# Patient Record
Sex: Female | Born: 1940
Health system: Southern US, Community
[De-identification: ages and names within clinical notes are randomized; demographics above are authoritative.]

## PROBLEM LIST (undated history)

## (undated) DIAGNOSIS — M51369 Other intervertebral disc degeneration, lumbar region without mention of lumbar back pain or lower extremity pain: Secondary | ICD-10-CM

## (undated) DIAGNOSIS — I639 Cerebral infarction, unspecified: Secondary | ICD-10-CM

## (undated) DIAGNOSIS — I10 Essential (primary) hypertension: Secondary | ICD-10-CM

## (undated) DIAGNOSIS — E1165 Type 2 diabetes mellitus with hyperglycemia: Secondary | ICD-10-CM

## (undated) DIAGNOSIS — E119 Type 2 diabetes mellitus without complications: Secondary | ICD-10-CM

## (undated) DIAGNOSIS — M1990 Unspecified osteoarthritis, unspecified site: Secondary | ICD-10-CM

## (undated) DIAGNOSIS — M5136 Other intervertebral disc degeneration, lumbar region: Secondary | ICD-10-CM

## (undated) DIAGNOSIS — G8929 Other chronic pain: Secondary | ICD-10-CM

## (undated) DIAGNOSIS — T7840XA Allergy, unspecified, initial encounter: Secondary | ICD-10-CM

## (undated) DIAGNOSIS — Z87891 Personal history of nicotine dependence: Secondary | ICD-10-CM

## (undated) DIAGNOSIS — E039 Hypothyroidism, unspecified: Secondary | ICD-10-CM

## (undated) DIAGNOSIS — E785 Hyperlipidemia, unspecified: Secondary | ICD-10-CM

## (undated) HISTORY — DX: Other intervertebral disc degeneration, lumbar region without mention of lumbar back pain or lower extremity pain: M51.369

## (undated) HISTORY — DX: Allergy, unspecified, initial encounter: T78.40XA

## (undated) HISTORY — DX: Other intervertebral disc degeneration, lumbar region: M51.36

## (undated) HISTORY — DX: Other chronic pain: G89.29

## (undated) HISTORY — DX: Type 2 diabetes mellitus with hyperglycemia: E11.65

## (undated) HISTORY — DX: Essential (primary) hypertension: I10

## (undated) HISTORY — DX: Type 2 diabetes mellitus without complications: E11.9

---

## 1971-04-28 HISTORY — PX: CHOLECYSTECTOMY: SHX55

## 1992-04-27 HISTORY — PX: ABDOMINAL HYSTERECTOMY: SHX81

## 2000-01-13 ENCOUNTER — Emergency Department (HOSPITAL_COMMUNITY): Admission: EM | Admit: 2000-01-13 | Discharge: 2000-01-13 | Payer: Self-pay | Admitting: Emergency Medicine

## 2000-01-13 ENCOUNTER — Encounter: Payer: Self-pay | Admitting: Emergency Medicine

## 2000-06-22 ENCOUNTER — Encounter: Payer: Self-pay | Admitting: Emergency Medicine

## 2000-06-22 ENCOUNTER — Emergency Department (HOSPITAL_COMMUNITY): Admission: EM | Admit: 2000-06-22 | Discharge: 2000-06-22 | Payer: Self-pay | Admitting: Emergency Medicine

## 2001-04-27 DIAGNOSIS — I6350 Cerebral infarction due to unspecified occlusion or stenosis of unspecified cerebral artery: Secondary | ICD-10-CM | POA: Insufficient documentation

## 2003-04-28 HISTORY — PX: DECOMPRESSIVE LUMBAR LAMINECTOMY LEVEL 4: SHX5794

## 2006-06-14 ENCOUNTER — Encounter (INDEPENDENT_AMBULATORY_CARE_PROVIDER_SITE_OTHER): Payer: Self-pay | Admitting: Internal Medicine

## 2006-06-14 LAB — CONVERTED CEMR LAB: Hgb A1c MFr Bld: 7.1 %

## 2006-06-24 ENCOUNTER — Ambulatory Visit: Payer: Self-pay | Admitting: Internal Medicine

## 2006-06-24 LAB — CONVERTED CEMR LAB
Cholesterol: 265 mg/dL
TSH: 0.841 microintl units/mL
Triglycerides: 171 mg/dL

## 2006-06-29 ENCOUNTER — Ambulatory Visit (HOSPITAL_COMMUNITY): Admission: RE | Admit: 2006-06-29 | Discharge: 2006-06-29 | Payer: Self-pay | Admitting: Internal Medicine

## 2006-06-29 DIAGNOSIS — E042 Nontoxic multinodular goiter: Secondary | ICD-10-CM

## 2006-07-07 ENCOUNTER — Other Ambulatory Visit: Admission: RE | Admit: 2006-07-07 | Discharge: 2006-07-07 | Payer: Self-pay | Admitting: Interventional Radiology

## 2006-07-07 ENCOUNTER — Encounter: Admission: RE | Admit: 2006-07-07 | Discharge: 2006-07-07 | Payer: Self-pay | Admitting: Internal Medicine

## 2006-07-20 ENCOUNTER — Ambulatory Visit: Payer: Self-pay | Admitting: Internal Medicine

## 2006-07-20 LAB — CONVERTED CEMR LAB
Microalb, Ur: 0.25 mg/dL
Microalbumin U total vol: NEGATIVE mg/L

## 2006-08-31 ENCOUNTER — Ambulatory Visit: Payer: Self-pay | Admitting: Internal Medicine

## 2006-09-07 ENCOUNTER — Ambulatory Visit: Payer: Self-pay | Admitting: *Deleted

## 2006-09-15 ENCOUNTER — Ambulatory Visit: Payer: Self-pay | Admitting: Internal Medicine

## 2006-09-18 ENCOUNTER — Encounter (INDEPENDENT_AMBULATORY_CARE_PROVIDER_SITE_OTHER): Payer: Self-pay | Admitting: Internal Medicine

## 2006-09-18 DIAGNOSIS — J309 Allergic rhinitis, unspecified: Secondary | ICD-10-CM

## 2006-09-18 DIAGNOSIS — E1165 Type 2 diabetes mellitus with hyperglycemia: Secondary | ICD-10-CM

## 2006-09-18 DIAGNOSIS — IMO0002 Reserved for concepts with insufficient information to code with codable children: Secondary | ICD-10-CM

## 2006-09-18 DIAGNOSIS — I10 Essential (primary) hypertension: Secondary | ICD-10-CM | POA: Insufficient documentation

## 2006-09-18 HISTORY — DX: Reserved for concepts with insufficient information to code with codable children: IMO0002

## 2006-09-18 HISTORY — DX: Type 2 diabetes mellitus with hyperglycemia: E11.65

## 2006-09-26 ENCOUNTER — Emergency Department (HOSPITAL_COMMUNITY): Admission: EM | Admit: 2006-09-26 | Discharge: 2006-09-26 | Payer: Self-pay | Admitting: Family Medicine

## 2006-10-03 ENCOUNTER — Emergency Department (HOSPITAL_COMMUNITY): Admission: EM | Admit: 2006-10-03 | Discharge: 2006-10-03 | Payer: Self-pay | Admitting: Emergency Medicine

## 2006-10-04 ENCOUNTER — Ambulatory Visit: Payer: Self-pay | Admitting: Internal Medicine

## 2006-10-14 ENCOUNTER — Ambulatory Visit: Payer: Self-pay | Admitting: Internal Medicine

## 2006-10-14 LAB — CONVERTED CEMR LAB
Creatinine, Ser: 0.7 mg/dL
Hemoglobin: 13.4 g/dL
TSH: 0.073 microintl units/mL

## 2006-10-15 ENCOUNTER — Inpatient Hospital Stay (HOSPITAL_COMMUNITY): Admission: AD | Admit: 2006-10-15 | Discharge: 2006-10-18 | Payer: Self-pay | Admitting: Family Medicine

## 2006-10-15 ENCOUNTER — Ambulatory Visit: Payer: Self-pay | Admitting: Internal Medicine

## 2006-10-15 ENCOUNTER — Ambulatory Visit: Payer: Self-pay | Admitting: Family Medicine

## 2006-10-15 DIAGNOSIS — A0472 Enterocolitis due to Clostridium difficile, not specified as recurrent: Secondary | ICD-10-CM | POA: Insufficient documentation

## 2006-11-02 ENCOUNTER — Ambulatory Visit: Payer: Self-pay | Admitting: Internal Medicine

## 2006-11-05 ENCOUNTER — Ambulatory Visit: Payer: Self-pay | Admitting: Internal Medicine

## 2006-11-10 ENCOUNTER — Ambulatory Visit: Payer: Self-pay | Admitting: Internal Medicine

## 2007-06-10 ENCOUNTER — Telehealth (INDEPENDENT_AMBULATORY_CARE_PROVIDER_SITE_OTHER): Payer: Self-pay | Admitting: Internal Medicine

## 2007-06-14 ENCOUNTER — Ambulatory Visit: Payer: Self-pay | Admitting: Internal Medicine

## 2007-06-14 LAB — CONVERTED CEMR LAB: Blood Glucose, Fingerstick: 244

## 2007-09-30 ENCOUNTER — Ambulatory Visit: Payer: Self-pay | Admitting: Internal Medicine

## 2007-09-30 LAB — CONVERTED CEMR LAB: Blood Glucose, Fingerstick: 130

## 2008-10-30 ENCOUNTER — Ambulatory Visit: Payer: Self-pay | Admitting: Physician Assistant

## 2008-10-30 DIAGNOSIS — B351 Tinea unguium: Secondary | ICD-10-CM

## 2008-11-02 LAB — CONVERTED CEMR LAB
CO2: 24 meq/L (ref 19–32)
Calcium: 10 mg/dL (ref 8.4–10.5)
Chloride: 105 meq/L (ref 96–112)
Creatinine, Ser: 0.6 mg/dL (ref 0.40–1.20)
Glucose, Bld: 169 mg/dL — ABNORMAL HIGH (ref 70–99)
Sodium: 141 meq/L (ref 135–145)

## 2008-11-29 ENCOUNTER — Telehealth (INDEPENDENT_AMBULATORY_CARE_PROVIDER_SITE_OTHER): Payer: Self-pay | Admitting: Internal Medicine

## 2008-12-18 ENCOUNTER — Ambulatory Visit: Payer: Self-pay | Admitting: Internal Medicine

## 2008-12-18 DIAGNOSIS — G479 Sleep disorder, unspecified: Secondary | ICD-10-CM

## 2008-12-18 DIAGNOSIS — M76899 Other specified enthesopathies of unspecified lower limb, excluding foot: Secondary | ICD-10-CM

## 2008-12-18 DIAGNOSIS — M25569 Pain in unspecified knee: Secondary | ICD-10-CM

## 2008-12-25 DIAGNOSIS — IMO0002 Reserved for concepts with insufficient information to code with codable children: Secondary | ICD-10-CM | POA: Insufficient documentation

## 2009-01-01 ENCOUNTER — Encounter (INDEPENDENT_AMBULATORY_CARE_PROVIDER_SITE_OTHER): Payer: Self-pay | Admitting: Internal Medicine

## 2009-01-01 DIAGNOSIS — E782 Mixed hyperlipidemia: Secondary | ICD-10-CM | POA: Insufficient documentation

## 2009-01-01 LAB — CONVERTED CEMR LAB
AST: 19 units/L (ref 0–37)
Basophils Absolute: 0 10*3/uL (ref 0.0–0.1)
Basophils Relative: 0 % (ref 0–1)
CO2: 24 meq/L (ref 19–32)
Chloride: 103 meq/L (ref 96–112)
Creatinine, Ser: 0.61 mg/dL (ref 0.40–1.20)
Creatinine, Urine: 119.9 mg/dL
Eosinophils Absolute: 0.1 10*3/uL (ref 0.0–0.7)
Eosinophils Relative: 2 % (ref 0–5)
HDL: 67 mg/dL (ref >39)
Hemoglobin: 13.1 g/dL (ref 12.0–15.0)
Hgb A1c MFr Bld: 7.3 % — ABNORMAL HIGH (ref 4.6–6.1)
MCHC: 32.9 g/dL (ref 30.0–36.0)
Microalb, Ur: 0.58 mg/dL (ref 0.00–1.89)
Monocytes Absolute: 0.5 10*3/uL (ref 0.1–1.0)
Monocytes Relative: 9 % (ref 3–12)
Neutrophils Relative %: 57 % (ref 43–77)
Platelets: 247 10*3/uL (ref 150–400)
Potassium: 3.8 meq/L (ref 3.5–5.3)
Sodium: 140 meq/L (ref 135–145)
Total CHOL/HDL Ratio: 4.3
Triglycerides: 178 mg/dL — ABNORMAL HIGH (ref <150)
VLDL: 36 mg/dL (ref 0–40)
WBC: 5.7 10*3/uL (ref 4.0–10.5)

## 2009-01-03 ENCOUNTER — Ambulatory Visit: Payer: Self-pay | Admitting: Internal Medicine

## 2009-01-07 ENCOUNTER — Encounter (INDEPENDENT_AMBULATORY_CARE_PROVIDER_SITE_OTHER): Payer: Self-pay | Admitting: Internal Medicine

## 2009-01-15 ENCOUNTER — Encounter: Admission: RE | Admit: 2009-01-15 | Discharge: 2009-02-07 | Payer: Self-pay | Admitting: Internal Medicine

## 2009-01-21 LAB — CONVERTED CEMR LAB
ALT: 22 units/L (ref 0–35)
AST: 21 units/L (ref 0–37)
Albumin: 4 g/dL (ref 3.5–5.2)
Alkaline Phosphatase: 59 units/L (ref 39–117)
CO2: 22 meq/L (ref 19–32)
Chloride: 104 meq/L (ref 96–112)
Creatinine, Ser: 0.63 mg/dL (ref 0.40–1.20)
Sodium: 140 meq/L (ref 135–145)

## 2009-01-25 ENCOUNTER — Ambulatory Visit: Payer: Self-pay | Admitting: Internal Medicine

## 2009-01-25 DIAGNOSIS — R1310 Dysphagia, unspecified: Secondary | ICD-10-CM | POA: Insufficient documentation

## 2009-01-25 DIAGNOSIS — R05 Cough: Secondary | ICD-10-CM

## 2009-01-25 LAB — CONVERTED CEMR LAB: Blood Glucose, Fingerstick: 166

## 2009-02-13 ENCOUNTER — Encounter (INDEPENDENT_AMBULATORY_CARE_PROVIDER_SITE_OTHER): Payer: Self-pay | Admitting: Internal Medicine

## 2009-02-14 ENCOUNTER — Ambulatory Visit: Payer: Self-pay | Admitting: Internal Medicine

## 2009-02-14 DIAGNOSIS — B373 Candidiasis of vulva and vagina: Secondary | ICD-10-CM

## 2009-02-14 DIAGNOSIS — M25559 Pain in unspecified hip: Secondary | ICD-10-CM

## 2009-02-14 LAB — CONVERTED CEMR LAB
Blood Glucose, Fingerstick: 178
Blood in Urine, dipstick: NEGATIVE
Glucose, Urine, Semiquant: NEGATIVE
Ketones, urine, test strip: NEGATIVE
Protein, U semiquant: NEGATIVE
Specific Gravity, Urine: 1.01
WBC Urine, dipstick: NEGATIVE
Whiff Test: NEGATIVE

## 2009-02-18 ENCOUNTER — Ambulatory Visit (HOSPITAL_COMMUNITY): Admission: RE | Admit: 2009-02-18 | Discharge: 2009-02-18 | Payer: Self-pay | Admitting: Internal Medicine

## 2009-02-20 ENCOUNTER — Encounter (INDEPENDENT_AMBULATORY_CARE_PROVIDER_SITE_OTHER): Payer: Self-pay | Admitting: Internal Medicine

## 2009-03-04 ENCOUNTER — Ambulatory Visit (HOSPITAL_COMMUNITY): Admission: RE | Admit: 2009-03-04 | Discharge: 2009-03-04 | Payer: Self-pay | Admitting: Family Medicine

## 2009-03-08 ENCOUNTER — Ambulatory Visit: Payer: Self-pay | Admitting: Internal Medicine

## 2009-03-08 LAB — CONVERTED CEMR LAB
AST: 20 units/L (ref 0–37)
Alkaline Phosphatase: 60 units/L (ref 39–117)
BUN: 16 mg/dL (ref 6–23)
CO2: 24 meq/L (ref 19–32)
Calcium: 10.4 mg/dL (ref 8.4–10.5)
Chloride: 104 meq/L (ref 96–112)
Potassium: 3.8 meq/L (ref 3.5–5.3)
Total Bilirubin: 0.5 mg/dL (ref 0.3–1.2)
Total Protein: 7.2 g/dL (ref 6.0–8.3)

## 2009-03-13 ENCOUNTER — Telehealth (INDEPENDENT_AMBULATORY_CARE_PROVIDER_SITE_OTHER): Payer: Self-pay | Admitting: Internal Medicine

## 2009-03-13 ENCOUNTER — Encounter (INDEPENDENT_AMBULATORY_CARE_PROVIDER_SITE_OTHER): Payer: Self-pay | Admitting: Internal Medicine

## 2009-03-20 ENCOUNTER — Encounter (INDEPENDENT_AMBULATORY_CARE_PROVIDER_SITE_OTHER): Payer: Self-pay | Admitting: Internal Medicine

## 2009-04-09 ENCOUNTER — Telehealth (INDEPENDENT_AMBULATORY_CARE_PROVIDER_SITE_OTHER): Payer: Self-pay | Admitting: Internal Medicine

## 2009-04-25 ENCOUNTER — Ambulatory Visit: Payer: Self-pay | Admitting: Internal Medicine

## 2009-04-25 DIAGNOSIS — J019 Acute sinusitis, unspecified: Secondary | ICD-10-CM | POA: Insufficient documentation

## 2009-05-02 ENCOUNTER — Encounter (INDEPENDENT_AMBULATORY_CARE_PROVIDER_SITE_OTHER): Payer: Self-pay | Admitting: Internal Medicine

## 2009-05-06 ENCOUNTER — Telehealth (INDEPENDENT_AMBULATORY_CARE_PROVIDER_SITE_OTHER): Payer: Self-pay | Admitting: Internal Medicine

## 2009-05-15 ENCOUNTER — Encounter (INDEPENDENT_AMBULATORY_CARE_PROVIDER_SITE_OTHER): Payer: Self-pay | Admitting: Internal Medicine

## 2009-05-28 ENCOUNTER — Telehealth (INDEPENDENT_AMBULATORY_CARE_PROVIDER_SITE_OTHER): Payer: Self-pay | Admitting: Internal Medicine

## 2009-08-22 ENCOUNTER — Ambulatory Visit: Payer: Self-pay | Admitting: Cardiology

## 2009-08-22 ENCOUNTER — Inpatient Hospital Stay (HOSPITAL_COMMUNITY): Admission: EM | Admit: 2009-08-22 | Discharge: 2009-08-24 | Payer: Self-pay | Admitting: Emergency Medicine

## 2009-08-23 ENCOUNTER — Encounter (INDEPENDENT_AMBULATORY_CARE_PROVIDER_SITE_OTHER): Payer: Self-pay | Admitting: Internal Medicine

## 2009-09-30 ENCOUNTER — Encounter (INDEPENDENT_AMBULATORY_CARE_PROVIDER_SITE_OTHER): Payer: Self-pay | Admitting: Internal Medicine

## 2009-10-10 ENCOUNTER — Encounter (INDEPENDENT_AMBULATORY_CARE_PROVIDER_SITE_OTHER): Payer: Self-pay | Admitting: Internal Medicine

## 2009-10-30 ENCOUNTER — Encounter (HOSPITAL_COMMUNITY): Admission: RE | Admit: 2009-10-30 | Discharge: 2010-01-01 | Payer: Self-pay | Admitting: Orthopedic Surgery

## 2009-11-06 ENCOUNTER — Encounter (INDEPENDENT_AMBULATORY_CARE_PROVIDER_SITE_OTHER): Payer: Self-pay | Admitting: Internal Medicine

## 2009-11-08 ENCOUNTER — Ambulatory Visit: Payer: Self-pay | Admitting: Internal Medicine

## 2009-11-08 DIAGNOSIS — R197 Diarrhea, unspecified: Secondary | ICD-10-CM | POA: Insufficient documentation

## 2009-11-08 LAB — CONVERTED CEMR LAB: Hgb A1c MFr Bld: 7.1 %

## 2009-11-09 ENCOUNTER — Encounter (INDEPENDENT_AMBULATORY_CARE_PROVIDER_SITE_OTHER): Payer: Self-pay | Admitting: Internal Medicine

## 2009-11-11 ENCOUNTER — Encounter (INDEPENDENT_AMBULATORY_CARE_PROVIDER_SITE_OTHER): Payer: Self-pay | Admitting: Internal Medicine

## 2009-11-11 ENCOUNTER — Emergency Department (HOSPITAL_COMMUNITY): Admission: EM | Admit: 2009-11-11 | Discharge: 2009-11-11 | Payer: Self-pay | Admitting: Emergency Medicine

## 2009-11-11 LAB — CONVERTED CEMR LAB
ALT: 28 units/L (ref 0–35)
Albumin: 4 g/dL (ref 3.5–5.2)
Alkaline Phosphatase: 88 units/L (ref 39–117)
BUN: 10 mg/dL (ref 6–23)
Basophils Relative: 0 % (ref 0–1)
CO2: 28 meq/L (ref 19–32)
Chloride: 100 meq/L (ref 96–112)
Creatinine, Ser: 0.68 mg/dL (ref 0.40–1.20)
Eosinophils Absolute: 0.1 10*3/uL (ref 0.0–0.7)
Eosinophils Relative: 1 % (ref 0–5)
Glucose, Bld: 285 mg/dL — ABNORMAL HIGH (ref 70–99)
HCT: 37.2 % (ref 36.0–46.0)
Hemoglobin: 12.6 g/dL (ref 12.0–15.0)
MCHC: 33.9 g/dL (ref 30.0–36.0)
Monocytes Relative: 8 % (ref 3–12)
Sodium: 140 meq/L (ref 135–145)
WBC: 6 10*3/uL (ref 4.0–10.5)

## 2009-11-12 ENCOUNTER — Telehealth (INDEPENDENT_AMBULATORY_CARE_PROVIDER_SITE_OTHER): Payer: Self-pay | Admitting: Internal Medicine

## 2009-12-11 ENCOUNTER — Telehealth (INDEPENDENT_AMBULATORY_CARE_PROVIDER_SITE_OTHER): Payer: Self-pay | Admitting: Nurse Practitioner

## 2009-12-13 ENCOUNTER — Ambulatory Visit: Payer: Self-pay | Admitting: Internal Medicine

## 2009-12-13 LAB — CONVERTED CEMR LAB: Blood Glucose, Fingerstick: 134

## 2009-12-23 ENCOUNTER — Ambulatory Visit: Payer: Self-pay | Admitting: Internal Medicine

## 2009-12-23 DIAGNOSIS — R42 Dizziness and giddiness: Secondary | ICD-10-CM

## 2009-12-23 DIAGNOSIS — K5900 Constipation, unspecified: Secondary | ICD-10-CM | POA: Insufficient documentation

## 2010-01-01 ENCOUNTER — Ambulatory Visit: Payer: Self-pay | Admitting: Internal Medicine

## 2010-01-24 ENCOUNTER — Ambulatory Visit: Payer: Self-pay | Admitting: Internal Medicine

## 2010-01-24 DIAGNOSIS — E876 Hypokalemia: Secondary | ICD-10-CM | POA: Insufficient documentation

## 2010-01-24 LAB — CONVERTED CEMR LAB
CO2: 27 meq/L (ref 19–32)
Calcium: 9.8 mg/dL (ref 8.4–10.5)
Glucose, Bld: 132 mg/dL — ABNORMAL HIGH (ref 70–99)
Potassium: 3.5 meq/L (ref 3.5–5.3)
Sodium: 142 meq/L (ref 135–145)
Vit D, 25-Hydroxy: 44 ng/mL (ref 30–89)

## 2010-01-28 ENCOUNTER — Emergency Department (HOSPITAL_COMMUNITY)
Admission: EM | Admit: 2010-01-28 | Discharge: 2010-01-28 | Payer: Self-pay | Source: Home / Self Care | Admitting: Emergency Medicine

## 2010-02-03 ENCOUNTER — Telehealth (INDEPENDENT_AMBULATORY_CARE_PROVIDER_SITE_OTHER): Payer: Self-pay | Admitting: Internal Medicine

## 2010-04-27 HISTORY — PX: THYROIDECTOMY: SHX17

## 2010-05-18 ENCOUNTER — Encounter: Payer: Self-pay | Admitting: Occupational Therapy

## 2010-05-27 NOTE — Assessment & Plan Note (Signed)
Summary: DIARRHEA//KT   Vital Signs:  Patient profile:   70 year old female Menstrual status:  hysterectomy Height:      63.5 inches Weight:      168 pounds BMI:     29.40 Temp:     97.7 degrees F oral Pulse rate:   94 / minute Pulse rhythm:   regular Resp:     18 per minute BP sitting:   121 / 78  (left arm) Cuff size:   regular  Vitals Entered By: [place] Shannon (November 08, 2009 3:38 PM) CC: pt says she has loose stools since april 26...Marland KitchenMarland Kitchen pt says she is not eating well... pt says she is taking carafate..... Is Patient Diabetic? Yes Pain Assessment Patient in pain? no      CBG Result 225  Does patient need assistance? Functional Status Self care Ambulation Normal   CC:  pt says she has loose stools since april 26...Marland KitchenMarland Kitchen pt says she is not eating well... pt says she is taking carafate.....Marland Kitchen  History of Present Illness: 1.  Diarrhea since hospitalization for atypical chest pain--just under left breast and radiated straight through to back--had constipation while in hospital:  Ruled out for MI and CT negative for PE.  A chest xray showed possible lung nodule, but not noted on CT of chest.  Did have elevated transaminases, but pt. followed up subsequently at [facility] and those resolved per pt.   Pt. started having loose to watery yellow stools about 3 times daily 5 days after hospitalization--drank some Smooth Move to get bowels to move twice prior to starting diarrhea.  Not sure, but feels started with lower abdominal pain about the same time.  Was started on Dexilant at [facility]--helped the lower abdominal pain and gassiness/bloating, but not diarrhea.   Pt. apparently had a viral hepatitis profile that was negative in hospital or soon after.  Had C. difficile toxin tested x 2 at least one month apart and were negative.  Had an EGD with Dr. Madilyn Fireman, [facility], which was normal--about 1 month ago.  As had a [place] in [place] 1 1/2 years ago, pt. states was decided per Dr.  Madilyn Fireman not to check again, despite new symptoms of diarrhea.   Pt. also has Sulcralfate 1 g three times a day that was prescribed after EGD, though pt. states EGD reported to her as normal.  H. pylori also reported as negative.  No melena or hematochezia.  When does have particulate matter in stool, does not look greasy or float.  No fevers with this.  NO fevers.  Had gall bladder removed in 1972--no problems with diarrhea after that.  Pt. did do one stool culture that was negative.  Not clear if checked for ova and parasites.  Pt. states she weighed 179 prior to diarrhea starting.  Hx of C. diff. colitis in recent ? 3 years--was difficult to diagnose then.  Pt. had hives with Flagyl.    Pt. leaving for [facility] for at least 1 week stay next week.  Wants to switch her care back to our clinic  Current Medications (verified): 1)  Metformin Hcl 500 Mg Tabs (Metformin Hcl) .... By Mouth Two Times A Day W/meals 2)  Hydrochlorothiazide 25 Mg Tabs (Hydrochlorothiazide) .... By Mouth Q Am 3)  Flexeril 10 Mg Tabs (Cyclobenzaprine Hcl) .... As Needed For Back and Neck Pain 4)  Hydrocodone-Acetaminophen 5-500 Mg  Tabs (Hydrocodone-Acetaminophen) .... One Tablet By Mouth Two Times A Day As Needed For Extreme Pain  5)  Nasacort Aq 55 Mcg/act Aers (Triamcinolone Acetonide(Nasal)) .... 2 Sprays Each Nostril Once Daily 6)  Fexofenadine Hcl 180 Mg Tabs (Fexofenadine Hcl) .Marland Kitchen.. 1 Tab By Mouth Daily 7)  Zolpidem Tartrate 10 Mg Tabs (Zolpidem Tartrate) .Marland Kitchen.. 1 Tab By Mouth Q Hs As Needed Sleep 8)  Simvastatin 40 Mg Tabs (Simvastatin) .Marland Kitchen.. 1 Tab By Mouth Daily 9)  Terbinafine Hcl 250 Mg Tabs (Terbinafine Hcl) .Marland Kitchen.. 1 Tab By Mouth Daily 10)  Omeprazole 20 Mg Cpdr (Omeprazole) .Marland Kitchen.. 1 Cap By Mouth Daily 1/2 Hour Before Breakfast On Empty Stomach 11)  Losartan Potassium 50 Mg Tabs (Losartan Potassium) .Marland Kitchen.. 1 Tab By Mouth Daily  Allergies (verified): 1)  Flagyl 2)  Synthroid 3)  Ace Inhibitors 4)  Clindamycin Hcl  (Clindamycin Hcl)  Physical Exam  General:  NAD--appears thinner Eyes:  no scleral icterus Mouth:  MMM Lungs:  Normal respiratory effort, chest expands symmetrically. Lungs are clear to auscultation, no crackles or wheezes. Heart:  Normal rate and regular rhythm. S1 and S2 normal without gallop, murmur, click, rub or other extra sounds. Abdomen:  Diffusely mildly tendersoft, no masses, no guarding, no rigidity, no rebound tenderness, no hepatomegaly, and no splenomegaly.     Impression & Recommendations:  Problem # 1:  DIARRHEA (ICD-787.91) With abdominal pain. Need records from Dr. Madilyn Fireman. Not clear why colonoscopy not done. Not clear if GI meds muddying the picture. Orders: T-Comprehensive Metabolic Panel [id number]) T-CBC w/Diff ([id number])  Complete Medication List: 1)  Hydrochlorothiazide 25 Mg Tabs (Hydrochlorothiazide) .... By mouth q am 2)  Hydrocodone-acetaminophen 5-500 Mg Tabs (Hydrocodone-acetaminophen) .... One tablet by mouth two times a day as needed for extreme pain 3)  Fexofenadine Hcl 180 Mg Tabs (Fexofenadine hcl) .Marland Kitchen.. 1 tab by mouth daily 4)  Zolpidem Tartrate 10 Mg Tabs (Zolpidem tartrate) .Marland Kitchen.. 1 tab by mouth q hs as needed sleep 5)  Losartan Potassium 50 Mg Tabs (Losartan potassium) .Marland Kitchen.. 1 tab by mouth daily 6)  Famotidine 40 Mg Tabs (Famotidine) .Marland Kitchen.. 1 tab by mouth in evening as needed epigastric pain  Patient Instructions: 1)  Stop Dexilant, stop Carafate, stop Lomotil if possible, stop all vitamins and supplements.  Stay away from dairy. 2)  Use Famotidine (pepcid)  only if develops high abdominal pain. 3)  Call progress report in 2 weeks. Prescriptions: FAMOTIDINE 40 MG TABS (FAMOTIDINE) 1 tab by mouth in evening as needed epigastric pain  #30 x 1   Entered and Authorized by:   Julieanne Manson MD   Signed by:   Julieanne Manson MD on 11/08/2009   Method used:   Electronically to        [facility] (retail)       [address]       [place], [place]  [id number]       Ph: [phone]       Fax: [phone]   RxID:   [id number]   Laboratory Results   Blood Tests     HGBA1C: 7.1%   (Normal Range: Non-Diabetic - 3-6%   Control Diabetic - 6-8%) CBG Random:: 225mg /dL

## 2010-05-27 NOTE — Progress Notes (Signed)
Summary: med refills  Phone Note Call from Patient Call back at [facility] Phone [phone]   Summary of Call: The pt needs more refills from hydrocodone, zolpidem and flexeril [facility]).  Please call her back when is ready. Oryan Winterton MD Initial call taken by: [name],  May 28, 2009 4:17 PM  Follow-up for Phone Call        Last got zolpidem #30 04/11/09 Hydrocodone #40 04-25-09 Flexeril #30/2 09/30/07 Follow-up by: Vesta Mixer CMA,  May 28, 2009 4:30 PM  Additional Follow-up for Phone Call Additional follow up Details #1::        The flexeril is not on her med list Additional Follow-up by: Julieanne Manson MD,  May 28, 2009 6:28 PM    Additional Follow-up for Phone Call Additional follow up Details #2::    no answer//unable to leave a voicemail.....Marland KitchenMarland KitchenMikey College CMA  June 11, 2009 11:52 AM   Prescriptions: ZOLPIDEM TARTRATE 10 MG TABS (ZOLPIDEM TARTRATE) 1 tab by mouth q hs as needed sleep  #30 x 0   Entered and Authorized by:   Julieanne Manson MD   Signed by:   Julieanne Manson MD on 05/28/2009   Method used:   Printed then faxed to ...       [facility] (retail)       [address]       [place], [place]  [id number]       Ph: [phone]       Fax: [phone]   RxID:   [id number] HYDROCODONE-ACETAMINOPHEN 5-500 MG  TABS (HYDROCODONE-ACETAMINOPHEN) One tablet by mouth two times a day as needed for extreme pain  #40 x 0   Entered and Authorized by:   Julieanne Manson MD   Signed by:   Julieanne Manson MD on 05/28/2009   Method used:   Printed then faxed to ...       [facility] (retail)       [address]       [place], [place]  [id number]       Ph: [phone]       Fax: [phone]   RxID:   [id number]

## 2010-05-27 NOTE — Letter (Signed)
Summary: REQUESTING INFORMATION FROM/DR.HAYES  REQUESTING INFORMATION FROM/DR.HAYES   Imported By: Arta Bruce 11/11/2009 14:37:20  _____________________________________________________________________  External Attachment:    Type:   Image     Comment:   External Document

## 2010-05-27 NOTE — Letter (Signed)
Summary: [facility]   Imported By: Arta Bruce 11/18/2009 11:50:33  _____________________________________________________________________  External Attachment:    Type:   Image     Comment:   External Document

## 2010-05-27 NOTE — Assessment & Plan Note (Signed)
Summary: FU ON HTN///KT   Vital Signs:  Patient profile:   70 year old female Menstrual status:  hysterectomy Height:      63.5 inches Weight:      173 pounds BMI:     30.27 Temp:     98.0 degrees F oral Pulse rate:   83 / minute Pulse rhythm:   regular Resp:     20 per minute BP sitting:   168 / 101  (left arm) Cuff size:   large  Vitals Entered By: CMA Student CC: follow-up visit for BP, patient states that she is currently experiencing light headedness during movement,onset for about a month... Is Patient Diabetic? No Pain Assessment Patient in pain? no       Does patient need assistance? Functional Status Self care Ambulation Normal   CC:  follow-up visit for BP, patient states that she is currently experiencing light headedness during movement, and onset for about a month....  History of Present Illness: 1.  Stopped Dexilant and Lomotil:  suffered a lot of nausea and vomiting intially as well as continued diarrhea--went to ED and given antiemetic, but stayed off other meds and after about 3-4 days, nausea and vomiting as well as diarrhea resolved.  Has since reverted back to her usual constipation.  Uses a senna containing tea.  Gets too much bloating with fiber laxatives.  Stool softeners in past have not really worked.  Has never tried Miralax.  Did get EGD report from Dr. Juan Quam.  2.  Constipation:  as above  3.  Imbalance:  Started soon after mid July--just before leaving for [facility].  Can be when moving from sitting to standing and taking initial steps, but also can happen when already standing and walking--thinks maybe when makes a quick move to one side or the othr.  Has not noted when supine and rolling over.  Seems to alway lean to left side when occurs.  Sense of imbalance there for a couple of seconds and then gone.  Was stronger and longer lasting when first started.  May be less frequent now as well.  Cannot recall congestion or cold symptoms  before this started, but does come and go during spring through fall.  Has definitely had symptoms in past 2 weeks.  4.  Sinus congestion:  Did not get relief with Loratidine--started the Zyrtec and feels it helped more so--though still with posterior pharyngeal drainage.  Pressure behind eyes much better.  Not using Fluticasone as it caused too much burning in nostrils.     Current Medications (verified): 1)  Hydrochlorothiazide 25 Mg Tabs (Hydrochlorothiazide) .... By Mouth Q Am 2)  Hydrocodone-Acetaminophen 5-500 Mg  Tabs (Hydrocodone-Acetaminophen) .... One Tablet By Mouth Two Times A Day As Needed For Extreme Pain 3)  Loratadine 10 Mg Tabs (Loratadine) .... One Tablet By Mouth Daily For Allergies 4)  Zolpidem Tartrate 10 Mg Tabs (Zolpidem Tartrate) .Marland Kitchen.. 1 Tab By Mouth Q Hs As Needed Sleep 5)  Losartan Potassium 50 Mg Tabs (Losartan Potassium) .Marland Kitchen.. 1 Tab By Mouth Daily 6)  Famotidine 40 Mg Tabs (Famotidine) .Marland Kitchen.. 1 Tab By Mouth in Evening As Needed Epigastric Pain 7)  Fluticasone Propionate 50 Mcg/act Susp (Fluticasone Propionate) .... One Spray in Each Nostril Two Times A Day **hold Head Down** 8)  Meloxicam 15 Mg Tabs (Meloxicam) .Marland Kitchen.. 1 Tablet By Mouth Once Daily With Food  Allergies (verified): 1)  Flagyl 2)  Synthroid 3)  Ace Inhibitors 4)  Clindamycin Hcl (Clindamycin Hcl)  Physical Exam  General:  NAD Eyes:  No definite nystagmus, EOMI Ears:  External ear exam shows no significant lesions or deformities.  Otoscopic examination reveals clear canals, tympanic membranes are intact bilaterally without bulging, retraction, inflammation or discharge. Hearing is grossly normal bilaterally. Nose:  mild mucosal swelling Mouth:  pharynx pink and moist.  No definite cobbling. Neck:  No deformities, masses, or tenderness noted. Lungs:  Normal respiratory effort, chest expands symmetrically. Lungs are clear to auscultation, no crackles or wheezes. Heart:  Normal rate and regular rhythm. S1  and S2 normal without gallop, murmur, click, rub or other extra sounds. Neurologic:  alert & oriented X3, cranial nerves II-XII intact, and finger-to-nose normal.  Romberg equivocal.  Gait normal, though did put hand out to left with initial steps walking down hallway. Did feel a bit off balance when asked to get up and down from exam table  though no observed problem with balance during this time period.   Impression & Recommendations:  Problem # 1:  DIARRHEA (ICD-787.91) Resovled with medication changes.  Problem # 2:  CONSTIPATION (ICD-564.00) To decrease dose as discussed if stools get loose again. Her updated medication list for this problem includes:    Miralax Powd (Polyethylene glycol 3350) .Marland KitchenMarland KitchenMarland KitchenMarland Kitchen 17 g in 8 oz fluid by mouth daily  Problem # 3:  ALLERGIC RHINITIS (ICD-477.9) Switch to Cetirizine and Nasocort The following medications were removed from the medication list:    Loratadine 10 Mg Tabs (Loratadine) ..... One tablet by mouth daily for allergies Her updated medication list for this problem includes:    Cetirizine Hcl 10 Mg Tabs (Cetirizine hcl) .Marland Kitchen... 1 tab by mouth daily at bedtime    Nasacort Aq 55 Mcg/act Aers (Triamcinolone acetonide(nasal)) .Marland Kitchen... 2 sprays each nostril daily--intolerance of fluticasone  Problem # 4:  INTERMITTENT VERTIGO (ICD-780.4) Having more of imbalance issue than full sense of dizziness. Suspect secondary to above as little in way of findings on exam--follow up in 1 month to see if improving. The following medications were removed from the medication list:    Loratadine 10 Mg Tabs (Loratadine) ..... One tablet by mouth daily for allergies Her updated medication list for this problem includes:    Cetirizine Hcl 10 Mg Tabs (Cetirizine hcl) .Marland Kitchen... 1 tab by mouth daily at bedtime  Complete Medication List: 1)  Hydrochlorothiazide 25 Mg Tabs (Hydrochlorothiazide) .... By mouth q am 2)  Hydrocodone-acetaminophen 5-500 Mg Tabs  (Hydrocodone-acetaminophen) .... One tablet by mouth two times a day as needed for extreme pain 3)  Zolpidem Tartrate 10 Mg Tabs (Zolpidem tartrate) .Marland Kitchen.. 1 tab by mouth q hs as needed sleep 4)  Losartan Potassium 50 Mg Tabs (Losartan potassium) .Marland Kitchen.. 1 tab by mouth daily 5)  Cetirizine Hcl 10 Mg Tabs (Cetirizine hcl) .Marland Kitchen.. 1 tab by mouth daily at bedtime 6)  Meloxicam 15 Mg Tabs (Meloxicam) .Marland Kitchen.. 1 tablet by mouth once daily with food dr. Darrelyn Hillock 7)  Nasacort Aq 55 Mcg/act Aers (Triamcinolone acetonide(nasal)) .... 2 sprays each nostril daily--intolerance of fluticasone 8)  Miralax Powd (Polyethylene glycol 3350) .Marland KitchenMarland KitchenMarland Kitchen 17 g in 8 oz fluid by mouth daily  Patient Instructions: 1)  Follow up with Dr. Delrae Alfred in 1 month --dizziness and constipation Prescriptions: MIRALAX  POWD (POLYETHYLENE GLYCOL 3350) 17 g in 8 oz fluid by mouth daily  #1 month x 11   Entered and Authorized by:   Julieanne Manson MD   Signed by:   Julieanne Manson MD on 12/23/2009   Method used:  Electronically to        [facility] (retail)       [address]       [place], [place]  [address]       Ph: [phone]       Fax: [phone]   RxID:   [id number] NASACORT AQ 55 MCG/ACT AERS (TRIAMCINOLONE ACETONIDE(NASAL)) 2 sprays each nostril daily--intolerance of Fluticasone  #1 x 11   Entered and Authorized by:   Julieanne Manson MD   Signed by:   Julieanne Manson MD on 12/23/2009   Method used:   Electronically to        [facility] (retail)       [address]       [place], [place]  [id number]       Ph: [phone]       Fax: [phone]   RxID:   [id number] CETIRIZINE HCL 10 MG TABS (CETIRIZINE HCL) 1 tab by mouth daily at bedtime  #30 x 6   Entered and Authorized by:   Julieanne Manson MD   Signed by:   Julieanne Manson MD on 12/23/2009   Method used:   Electronically to        [facility] (retail)       [address]       [place], [place]  [id number]       Ph: [phone]       Fax: [phone]   RxID:   [id number]   Appended Document: FU ON HTN///KT    Clinical Lists Changes  Problems: Assessed HYPERTENSION as comment only - Pt. actually taking 100 mg of Losartan. BP check in 1 week. Add Amlodipine 5 mg Her updated medication list for this problem includes:    Hydrochlorothiazide 25 Mg Tabs (Hydrochlorothiazide) ..... By mouth q am    Losartan Potassium 100 Mg Tabs (Losartan potassium) .Marland Kitchen... 1 tab by mouth daily    Amlodipine Besylate 5 Mg Tabs (Amlodipine besylate) .Marland Kitchen... 1 tab by mouth daily  Medications: Added new medication of AMLODIPINE BESYLATE 5 MG TABS (AMLODIPINE BESYLATE) 1 tab by mouth daily - Signed Changed medication from LOSARTAN POTASSIUM 50 MG TABS (LOSARTAN POTASSIUM) 1 tab by mouth daily to LOSARTAN POTASSIUM 100 MG TABS (LOSARTAN POTASSIUM) 1 tab by mouth daily - Signed Rx of AMLODIPINE BESYLATE 5 MG TABS (AMLODIPINE BESYLATE) 1 tab by mouth daily;  #30 x 11;  Signed;  Entered by: Julieanne Manson MD;  Authorized by: Julieanne Manson MD;  Method used: Electronically to [facility] [address] [facility] [address], [place], [place]  [id number], Ph: [phone], Fax: [phone] Rx of LOSARTAN POTASSIUM 100 MG TABS (LOSARTAN POTASSIUM) 1 tab by mouth daily;  #30 x 11;  Signed;  Entered by: Julieanne Manson MD;  Authorized by: Julieanne Manson MD;  Method used: Electronically to [facility] [address] [address], [place], [place]  [id number], Ph: [phone], Fax: [phone]    Prescriptions: LOSARTAN POTASSIUM 100 MG TABS (LOSARTAN POTASSIUM) 1 tab by mouth daily  #30 x 11   Entered and Authorized by:   Julieanne Manson MD   Signed by:   Julieanne Manson MD on 12/23/2009   Method used:   Electronically to        [facility] (retail)       [address]       [place], [place]  [id number]  Ph: [phone]       Fax: [phone]   RxID:   [id number] AMLODIPINE BESYLATE 5 MG TABS (AMLODIPINE BESYLATE) 1 tab by mouth daily  #30 x 11   Entered and Authorized by:   Julieanne Manson MD   Signed by:   Julieanne Manson MD on 12/23/2009   Method used:   Electronically to        [facility] (retail)       [address]       [place], [place]  [address]       Ph: [phone]       Fax: [phone]   RxID:   [id number]       Impression & Recommendations:  Problem # 1:  HYPERTENSION (ICD-401.9) Pt. actually taking 100 mg of Losartan. BP check in 1 week. Add Amlodipine 5 mg Her updated medication list for this problem includes:    Hydrochlorothiazide 25 Mg Tabs (Hydrochlorothiazide) ..... By mouth q am    Losartan Potassium 100 Mg Tabs (Losartan potassium) .Marland Kitchen... 1 tab by mouth daily    Amlodipine Besylate 5 Mg Tabs (Amlodipine besylate) .Marland Kitchen... 1 tab by mouth daily  Complete Medication List: 1)  Hydrochlorothiazide 25 Mg Tabs (Hydrochlorothiazide) .... By mouth q am 2)  Hydrocodone-acetaminophen 5-500 Mg Tabs (Hydrocodone-acetaminophen) .... One tablet by mouth two times a day as needed for extreme pain 3)  Zolpidem Tartrate 10 Mg Tabs (Zolpidem tartrate) .Marland Kitchen.. 1 tab by mouth q hs as needed sleep 4)  Losartan Potassium 100 Mg Tabs (Losartan potassium) .Marland Kitchen.. 1 tab by mouth daily 5)  Cetirizine Hcl 10 Mg Tabs (Cetirizine hcl) .Marland Kitchen.. 1 tab by mouth daily at bedtime 6)  Meloxicam 15 Mg Tabs (Meloxicam) .Marland Kitchen.. 1 tablet by mouth once daily with food dr. Darrelyn Hillock 7)  Nasacort Aq 55 Mcg/act Aers (Triamcinolone acetonide(nasal)) .... 2 sprays each nostril daily--intolerance of fluticasone 8)  Miralax Powd (Polyethylene glycol 3350) .Marland KitchenMarland KitchenMarland Kitchen 17 g in 8 oz fluid by mouth daily 9)  Amlodipine Besylate 5 Mg Tabs (Amlodipine besylate) .Marland Kitchen.. 1 tab by mouth daily

## 2010-05-27 NOTE — Progress Notes (Signed)
Summary: LAB RESULTYS/? ABOUT POTASSIUM MED  Phone Note Call from Patient Call back at Home Phone [phone] Call back at [phone]   Summary of Call: PT CALLED NEED TO KNOW HER LAB RESULT MOVING TO [place] [facility] MAY NEED TO UP HER MEDS PLEASE GIVE HER A CALL BACK WITH HER RESULTS//[phone] Initial call taken by: Arta Bruce,  February 03, 2010 12:35 PM  Follow-up for Phone Call        Joy Ward CALLED TO CHECK UP ON HER LAB RESULTS, AND TO ALOS LET YOU KNOW THAT SHE FELL LAST TUESDAY AND WENT [place], SHE WAS BRUISED BADLY AND STILL ACHING.  SHE IS LEAVING THIS WEEKEND AND WOULD LIKE TO TALK W/SOMEONE. A ? ABOUT HER POTASSIUM PILLS. Follow-up by: Leodis Rains,  February 04, 2010 12:51 PM  Additional Follow-up for Phone Call Additional follow up Details #1::        Her potassium was in the low normal range--she needs to continue the potassium, which was filled at her last visit.   If she has other questions regarding the potassium pills to let me know.   Good luck with her move. Additional Follow-up by: Julieanne Manson MD,  February 05, 2010 7:52 AM    Additional Follow-up for Phone Call Additional follow up Details #2::    PT AWARE OF LAB RESULTS WILL MAIL DIET WITH POTASSIUM RICH FOODS   PT WOULD LIKE PROVIDERTO RETURN CALL REGARDING RECENT FALL. PT IS SORE/ACHY.  Tried to call--left message to call back next week if still with questions.  Also left info that Vitamin D level fine--to continue to eat/drink vitamin D fortified foods.  Julieanne Manson MD  February 21, 2010 12:17 PM

## 2010-05-27 NOTE — Letter (Signed)
Summary: AGREEMENT FOR SUBSTANCE CONTROLLED PRESCRIPTIONS  AGREEMENT FOR SUBSTANCE CONTROLLED PRESCRIPTIONS   Imported By: Arta Bruce 05/23/2009 09:34:24  _____________________________________________________________________  External Attachment:    Type:   Image     Comment:   External Document

## 2010-05-27 NOTE — Progress Notes (Signed)
Summary: CALL ABOUT HER DIARRHEA  Phone Note Call from Patient Call back at Home Phone [phone] Call back at [phone]   Reason for Call: Talk to Doctor Summary of Call: the pt states that the provider called her yesterday and she is wondered if the provider can call her back today (in reference of the diarrhea) Makinlee Awwad MD Initial call taken by: [name],  November 12, 2009 3:23 PM  Follow-up for Phone Call        MS Backs CALLED AND WANTS TO KNOW IF SHE CAN HEAR FROM DR Zameria Vogl CONCERNING HER DIARRHEA, BECAUSE SHE IS LEAVING IN THE MORNING AND HER PLANE LEAVES AT 10. SHE SAYS SHE NEEDS TO KNOW IF SHE WILL NEED MEDS. Follow-up by: Leodis Rains,  November 13, 2009 12:54 PM  Additional Follow-up for Phone Call Additional follow up Details #1::        Pt just wanted to touch base about her test results for the thyroid, she leaves in the morning for [facility].  Also requesting for a rx for vicodin for while she is gone.  [facility].  Pt will be in [place], [place]. There is a [employer] there also [address] [phone].  She last got #40 in Feb. 2011 from [place].  She said she is no longer going to be seein the other doctor she was seeing. Additional Follow-up by: Vesta Mixer CMA,  November 13, 2009 4:10 PM    Additional Follow-up for Phone Call Additional follow up Details #2::    Skip #1--anwered in following phone note  2.  Let pt. know the rest of her thyroid panel returned normal--unlikely that that is causing her diarrhea. I am planning to speak with Dr. Madilyn Fireman regarding her diarrhea beginning of week.  I did get his records, but EGD results not in them.  3.  Please call Dr. Madilyn Fireman' office and see if we can get a copy of her EGD report and any biopsy results/H. pylori testing.  Follow-up by: Julieanne Manson MD,  November 17, 2009 4:58 PM  Additional Follow-up for Phone Call Additional follow up Details #3:: Details for Additional Follow-up Action Taken: Need above info still.   Julieanne Manson MD  November 20, 2009 11:01 PM   Tiffany--I need this info--started to call Dr. Madilyn Fireman office Friday, but was interrupted.  Please call on Monday.  Let them know she is still having diarrhea and could we please have them consider doing a colonoscopy.  I am assuming the EGD was normal or did not show a cause of her symptoms.  I discussed the pain medication with Ms. Gane at her visit.  Will address when I return.  Julieanne Manson MD  November 23, 2009 4:52 PM   Records from Dr. Madilyn Fireman are in your cubby........... Tiffany McCoy CMA  November 25, 2009 11:00 AM   MS Arnell CALLED & SAYS THAT SINCE SHE STOPPED THE MEDICATION, SHE HASN'T HAD THE PAIN, BUT WHEN SHE EAT CERTAIN FOODS, IT STILL CAUSES HER TO HAVE DIARRHEA,BUT SHE SAYS SHE IS STILL NOT FEELING THAT GOOD.Cala Bradford Tinnin  November 25, 2009 4:05 PM  Have her set up an appt. to see me when she returns to area.  Julieanne Manson MD  December 03, 2009 5:55 PM   Appt made by Selena Batten............ Elmarie Shiley McCoy CMA  December 12, 2009 2:42 PM

## 2010-05-27 NOTE — Letter (Signed)
Summary: MEDICAL MODALITIES//MEDICAL NECESSITY  MEDICAL MODALITIES//MEDICAL NECESSITY   Imported By: Arta Bruce 05/15/2009 11:08:53  _____________________________________________________________________  External Attachment:    Type:   Image     Comment:   External Document

## 2010-05-27 NOTE — Assessment & Plan Note (Signed)
Summary: Acute - Allergic Rhinitis   Vital Signs:  Patient profile:   70 year old female Menstrual status:  hysterectomy Weight:      167.7 pounds Temp:     97.5 degrees F oral Pulse rate:   88 / minute Pulse (ortho):   86 / minute Pulse rhythm:   regular Resp:     20 per minute BP sitting:   152 / 102  (left arm) BP standing:   158 / 107 Cuff size:   regular  Vitals Entered ByLevon Hedger (December 13, 2009 10:06 AM) CC: pt has been real light headed and sinus infection, Hypertension Management Is Patient Diabetic? Yes Pain Assessment Patient in pain? no      CBG Result 134 CBG Device ID b  Does patient need assistance? Functional Status Self care Ambulation Normal   Serial Vital Signs/Assessments:  Time      Position  BP       Pulse  Resp  Temp     By           R Arm     154/100                        Levon Hedger           Lying RA  166/101  81                    Brenda Craddock           Sitting   153/92   [address]           Standing  158/107  86                    Levon Hedger   CC:  pt has been real light headed and sinus infection and Hypertension Management.  History of Present Illness:  Pt into the office with "sinus problems" "I know when I have sinus problems - I am 70 years old" Pt is reluctant to report symptoms to provider +eye pressure -sneezing or cough -fever +slight nasal congestion +neck pain but usually at night (not new - ongoing problem) Denies any nausea, vomiting or diarrhea  No OTC meds  Pt does NOT have her medications with her in the office: Fexofenodine pt reports she takes daily "it's like candy"  Unable to recall any previous allergy meds No tobacco use  Hypertension History:      Pt has not taken her BP meds today.        Positive major cardiovascular risk factors include female age 81 years old or older, diabetes, hyperlipidemia, and hypertension.  Negative major cardiovascular risk  factors include non-tobacco-user status.        Positive history for target organ damage include prior stroke (or TIA).     Allergies (verified): 1)  Flagyl 2)  Synthroid 3)  Ace Inhibitors 4)  Clindamycin Hcl (Clindamycin Hcl)  Review of Systems General:  +dizziness: only with ambulation . ENT:  Denies earache, nasal congestion, and sore throat. CV:  Denies chest pain or discomfort. Resp:  Denies cough. GI:  Denies diarrhea; Diarrhea has resolved.  Physical Exam  General:  alert.   Head:  normocephalic.   Ears:  bil TM with clear fluid - no erythema Mouth:  fair dentition.   Lungs:  normal breath sounds.   Heart:  normal rate and regular rhythm.     Impression & Recommendations:  Problem # 1:  ALLERGIC RHINITIS (ICD-477.9) advised pt of the dx and medications Her updated medication list for this problem includes:    Loratadine 10 Mg Tabs (Loratadine) ..... One tablet by mouth daily for allergies    Fluticasone Propionate 50 Mcg/act Susp (Fluticasone propionate) ..... One spray in each nostril two times a day **hold head down** Pt very anxious to leave and wanted to do so evern prior to checking of orthostatic BP pt to return in 1 week for blood pressure check  Problem # 3:  HYPERTENSION (ICD-401.9) BP very elevated toda. Pt insistant that it is because she has not take her meds yet will have her return for a bp check Her updated medication list for this problem includes:    Hydrochlorothiazide 25 Mg Tabs (Hydrochlorothiazide) ..... By mouth q am    Losartan Potassium 50 Mg Tabs (Losartan potassium) .Marland Kitchen... 1 tab by mouth daily  Complete Medication List: 1)  Hydrochlorothiazide 25 Mg Tabs (Hydrochlorothiazide) .... By mouth q am 2)  Hydrocodone-acetaminophen 5-500 Mg Tabs (Hydrocodone-acetaminophen) .... One tablet by mouth two times a day as needed for extreme pain 3)  Loratadine 10 Mg Tabs (Loratadine) .... One tablet by mouth daily for allergies 4)  Zolpidem Tartrate  10 Mg Tabs (Zolpidem tartrate) .Marland Kitchen.. 1 tab by mouth q hs as needed sleep 5)  Losartan Potassium 50 Mg Tabs (Losartan potassium) .Marland Kitchen.. 1 tab by mouth daily 6)  Famotidine 40 Mg Tabs (Famotidine) .Marland Kitchen.. 1 tab by mouth in evening as needed epigastric pain 7)  Fluticasone Propionate 50 Mcg/act Susp (Fluticasone propionate) .... One spray in each nostril two times a day **hold head down**  Other Orders: Capillary Blood Glucose/CBG (80998)  Hypertension Assessment/Plan:      The patient's hypertensive risk group is category C: Target organ damage and/or diabetes.  Her calculated 10 year risk of coronary heart disease is 27 %.  Today's blood pressure is 152/102.  Her blood pressure goal is < 130/80.  Patient Instructions: 1)  Blood pressure - high today but you have not taken any medications yet. 2)  Allergic Rhinitis - Start loratadine 10mg  by mouth daily and nasal spray twice daily (hold head down) 3)  Follow up in 1 week for blood pressure check 4)  Nurse Triage - Aggie Cosier 5)  Take medications before this visit Prescriptions: FLUTICASONE PROPIONATE 50 MCG/ACT SUSP (FLUTICASONE PROPIONATE) One spray in each nostril two times a day **hold head down**  #1 x 5   Entered and Authorized by:   Lehman Prom FNP   Signed by:   Lehman Prom FNP on 12/13/2009   Method used:   Print then Give to Patient   RxID:   [id number] LORATADINE 10 MG TABS (LORATADINE) One tablet by mouth daily for allergies  #30 x 5   Entered and Authorized by:   Lehman Prom FNP   Signed by:   Lehman Prom FNP on 12/13/2009   Method used:   Print then Give to Patient   RxID:   [id number]

## 2010-05-27 NOTE — Assessment & Plan Note (Signed)
Summary: 1 MONTH FYU DIZZINESS//KT   Vital Signs:  Patient profile:   70 year old female Menstrual status:  hysterectomy Weight:      167.7 pounds Temp:     98.0 degrees F Pulse rate:   90 / minute Resp:     16 per minute BP sitting:   124 / 86  (left arm) Cuff size:   regular  Vitals Entered By: Michelle Nasuti (January 24, 2010 10:07 AM) CC: follow-up visit on sinus infection and dizziness Pain Assessment Patient in pain? yes      Intensity: 5   CC:  follow-up visit on sinus infection and dizziness.  History of Present Illness: 1.  Constipation:  Tried the Miralax and just caused her to have a lot of gas and flatus.  Stopped med after 4 days because of this.  Did not relieve constipation.  Had another few days of loose stools again and resolved on own.  Uses senna tea and that helps her use the bathroom just fine.  2.  Allergic rhinitis:  Cetirizine helped--stopped because her hip bursitis flaired and she thought the med might be the cause--however has had chronic intermittent problems with this before.  Sees Dr. Darrelyn Hillock for shots.  Nasal corticosteroids helps.  3.  Dizziness:  Resolved.    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  Needs Rx written  Allergies (verified): 1)  Flagyl 2)  Synthroid 3)  Ace Inhibitors 4)  Clindamycin Hcl (Clindamycin Hcl)  Physical Exam  General:  NAD Head:  Tender over maxillary sinuses Eyes:  No corneal or conjunctival inflammation noted. EOMI. Perrla. Funduscopic exam benign, without hemorrhages, exudates or papilledema. Vision grossly normal. Ears:  External ear exam shows no significant lesions or deformities.  Otoscopic examination reveals clear canals, tympanic membranes are intact bilaterally without bulging, retraction, inflammation or discharge. Hearing is grossly normal bilaterally. Nose:  Nasal mucosal swelling and clear discharge Mouth:  pharynx pink and moist.   Neck:  No deformities, masses, or tenderness  noted. Lungs:  Normal respiratory effort, chest expands symmetrically. Lungs are clear to auscultation, no crackles or wheezes. Heart:  Normal rate and regular rhythm. S1 and S2 normal without gallop, murmur, click, rub or other extra sounds.   Impression & Recommendations:  Problem # 1:  ALLERGIC RHINITIS (ICD-477.9) Restart Cetirizine or try otc Allegra Her updated medication list for this problem includes:    Cetirizine Hcl 10 Mg Tabs (Cetirizine hcl) .Marland Kitchen... 1 tab by mouth daily at bedtime    Nasacort Aq 55 Mcg/act Aers (Triamcinolone acetonide(nasal)) .Marland Kitchen... 2 sprays each nostril daily--intolerance of fluticasone  Problem # 2:  HYPOKALEMIA (ICD-276.8) Pt. wanted rechecked before leaving. Orders: T-Basic Metabolic Panel [phone]) T- * Misc. Laboratory test [phone])  Problem # 3:  INTERMITTENT VERTIGO (ICD-780.4) Resolved Her updated medication list for this problem includes:    Cetirizine Hcl 10 Mg Tabs (Cetirizine hcl) .Marland Kitchen... 1 tab by mouth daily at bedtime  Problem # 4:  CONSTIPATION (ICD-564.00) Will just continue with senna tea The following medications were removed from the medication list:    Miralax Powd (Polyethylene glycol 3350) .Marland KitchenMarland KitchenMarland KitchenMarland Kitchen 17 g in 8 oz fluid by mouth daily  Complete Medication List: 1)  Hydrochlorothiazide 25 Mg Tabs (Hydrochlorothiazide) .... By mouth q am 2)  Hydrocodone-acetaminophen 5-500 Mg Tabs (Hydrocodone-acetaminophen) .... One tablet by mouth two times a day as needed for extreme pain 3)  Zolpidem Tartrate 10 Mg Tabs (Zolpidem tartrate) .Marland Kitchen.. 1 tab by mouth q hs as needed  sleep 4)  Losartan Potassium 100 Mg Tabs (Losartan potassium) .Marland Kitchen.. 1 tab by mouth daily 5)  Cetirizine Hcl 10 Mg Tabs (Cetirizine hcl) .Marland Kitchen.. 1 tab by mouth daily at bedtime 6)  Meloxicam 15 Mg Tabs (Meloxicam) .Marland Kitchen.. 1 tablet by mouth once daily with food dr. Darrelyn Hillock 7)  Nasacort Aq 55 Mcg/act Aers (Triamcinolone acetonide(nasal)) .... 2 sprays each nostril daily--intolerance of  fluticasone 8)  Amlodipine Besylate 5 Mg Tabs (Amlodipine besylate) .Marland Kitchen.. 1 tab by mouth daily  Patient Instructions: 1)  Saline nasal spray--may uise all day as long as not right after Nasocort Prescriptions: HYDROCODONE-ACETAMINOPHEN 5-500 MG  TABS (HYDROCODONE-ACETAMINOPHEN) One tablet by mouth two times a day as needed for extreme pain  #60 x 0   Entered and Authorized by:   Julieanne Manson MD   Signed by:   Julieanne Manson MD on 01/24/2010   Method used:   Print then Give to Patient   RxID:   [id number] AMLODIPINE BESYLATE 5 MG TABS (AMLODIPINE BESYLATE) 1 tab by mouth daily  #30 x 4   Entered and Authorized by:   Julieanne Manson MD   Signed by:   Julieanne Manson MD on 01/24/2010   Method used:   Print then Give to Patient   RxID:   [id number] NASACORT AQ 55 MCG/ACT AERS (TRIAMCINOLONE ACETONIDE(NASAL)) 2 sprays each nostril daily--intolerance of Fluticasone  #1 x 4   Entered and Authorized by:   Julieanne Manson MD   Signed by:   Julieanne Manson MD on 01/24/2010   Method used:   Print then Give to Patient   RxID:   [id number] CETIRIZINE HCL 10 MG TABS (CETIRIZINE HCL) 1 tab by mouth daily at bedtime  #30 x 4   Entered and Authorized by:   Julieanne Manson MD   Signed by:   Julieanne Manson MD on 01/24/2010   Method used:   Print then Give to Patient   RxID:   [id number] GLOVFIEP POTASSIUM 100 MG TABS (LOSARTAN POTASSIUM) 1 tab by mouth daily  #30 x 4   Entered and Authorized by:   Julieanne Manson MD   Signed by:   Julieanne Manson MD on 01/24/2010   Method used:   Print then Give to Patient   RxID:   [id number] HYDROCHLOROTHIAZIDE 25 MG TABS (HYDROCHLOROTHIAZIDE) by mouth q am  #60 Tablet x 4   Entered and Authorized by:   Julieanne Manson MD   Signed by:   Julieanne Manson MD on 01/24/2010   Method used:   Print then Give to Patient   RxID:   [id number]   Appended Document: 1 MONTH FYU  DIZZINESS//KT    Clinical Lists Changes  Medications: Added new medication of KLOR-CON M20 20 MEQ CR-TABS (POTASSIUM CHLORIDE CRYS CR) 1 tab by mouth daily - Signed Rx of KLOR-CON M20 20 MEQ CR-TABS (POTASSIUM CHLORIDE CRYS CR) 1 tab by mouth daily;  #30 x 4;  Signed;  Entered by: Julieanne Manson MD;  Authorized by: Julieanne Manson MD;  Method used: Print then Give to Patient Observations: Added new observation of BG FINGER: 143  (01/24/2010 10:53)    Prescriptions: KLOR-CON M20 20 MEQ CR-TABS (POTASSIUM CHLORIDE CRYS CR) 1 tab by mouth daily  #30 x 4   Entered and Authorized by:   Julieanne Manson MD   Signed by:   Julieanne Manson MD on 01/24/2010   Method used:   Print then Give to Patient   RxID:   [id number]    Laboratory Results  Blood Tests     CBG Random:: 143

## 2010-05-27 NOTE — Progress Notes (Signed)
Summary: STILL HAS SINUS INFECTION  Phone Note Call from Patient Call back at Home Phone [phone]   Summary of Call: Joy Ward pt. ms Cranston finished the 10 day Augmentin, but she says she still has the sinus infection. she started feeling worse saturday, and she has  a lot of green and yellow mucus . she is wondering if you can call something else into [facility] on [address]. Initial call taken by: Leodis Rains,  May 06, 2009 2:06 PM  Follow-up for Phone Call        Pt still having sinus infection and she feels very sick and pt needs some help.  Pt already tookd the  medication that was only for 10 days but help her for the first three days.  [name]  May 06, 2009 4:02 PM  Additional Follow-up for Phone Call Additional follow up Details #1::        pt seen by Dr. Delrae Alfred 12/30 rx for Augmentin took for 10 days with some relief, finish med on 05/03/09 now has lg amt. thick yellow nasal d/c, afebrile Additional Follow-up by: Gaylyn Cheers RN,  May 07, 2009 12:42 PM    Additional Follow-up for Phone Call Additional follow up Details #2::    Just finished a course of antibiotics on 05/03/2009 Mucous does not necessarily mean infection.  She should be taking allergy meds to prevent re-inflammation of sinuses. would advise pt take the fexofenadine 180 by mouth daily (should have refills as last ordered on 11/2008 with 10 refills) She should also be using the nasal spray - nasacort (also prescribed in 11/2008 with 10 refills). Other conservative treatements would be humdifier at home, or boil water on the stove to get some humidity in the air.   Avoid irritants such as cigarette smoke, air freshners, cleaners while ill. Follow up with provider in 1 week if symptoms still present Follow-up by: Lehman Prom FNP,  May 07, 2009 12:53 PM  Additional Follow-up for Phone Call Additional follow up Details #3:: Details for Additional Follow-up Action Taken: pt  notified, has not been taking fexofenadine or nasacort encourage to take as well as the conservative tx. She feels strongly that she neds antibiotics. Instructed to call if develops fever or feels worse which she said she did. She said she would wait and talk to Dr. Delrae Alfred Additional Follow-up by: Gaylyn Cheers RN,  May 07, 2009 1:51 PM

## 2010-05-27 NOTE — Miscellaneous (Signed)
Summary: Rehab Report//DISCHARGE SUMMARY  Rehab Report//DISCHARGE SUMMARY   Imported By: Arta Bruce 05/15/2009 11:41:51  _____________________________________________________________________  External Attachment:    Type:   Image     Comment:   External Document

## 2010-05-27 NOTE — Progress Notes (Signed)
Summary: DIZZY AND LIGHT HEADED  Phone Note Call from Patient Call back at [phone]--CELL   Reason for Call: Talk to Nurse Summary of Call: MULBERRY PT. MS Taber IS SCHEDULED TO SEE MULBERRY ON TOMORROW, BUT WE HAVE TO RESCHED. AND THE NEXT AVAILABLE IS 08/29 AND SHE DOESN'T WANT TO WAIT THAT LONG BECAUSE SHE IS FEELING LIGHT HEADED AND DIZZY. Initial call taken by: Leodis Rains,  December 11, 2009 12:04 PM  Follow-up for Phone Call        Left message on voicemail for pt. to return call.  Dutch Quint RN  December 11, 2009 2:24 PM   Additional Follow-up for Phone Call Additional follow up Details #1::        Have her come in for a nurse visit for lying and standing bp and pulse. See if her diarrhea is still a problem or if that is resolving.   If she is continuing to have diarrhea, repeat a stool C. diff antigen and set up a referral to Dr. Madilyn Fireman, GI for colonoscopy. If C. Diff comes back positive before I return, she is allergic to Metronidazole and will have to take oral Vancomycin.  Our pharmacy has worked on getting this before--have to use IV vanc and make it into an oral solution.  Additional Follow-up by: Julieanne Manson MD,  December 11, 2009 8:44 PM    Additional Follow-up for Phone Call Additional follow up Details #2::    Left message on voicemail for pt. to return call.  Dutch Quint RN  December 12, 2009 4:17 PM   Additional Follow-up for Phone Call Additional follow up Details #3:: Details for Additional Follow-up Action Taken: Pt into the office today Additional Follow-up by: Lehman Prom FNP,  December 13, 2009 11:19 AM

## 2010-05-27 NOTE — Letter (Signed)
Summary: [facility]//MEDICAL NECSSITY  [facility]//MEDICAL NECSSITY   Imported By: Arta Bruce 07/11/2009 12:12:48  _____________________________________________________________________  External Attachment:    Type:   Image     Comment:   External Document

## 2010-07-12 LAB — POCT I-STAT, CHEM 8
Calcium, Ion: 1.24 mmol/L (ref 1.12–1.32)
Chloride: 99 mEq/L (ref 96–112)
Glucose, Bld: 158 mg/dL — ABNORMAL HIGH (ref 70–99)
Potassium: 2.6 mEq/L — CL (ref 3.5–5.1)

## 2010-07-15 LAB — COMPREHENSIVE METABOLIC PANEL
ALT: 167 U/L — ABNORMAL HIGH (ref 0–35)
ALT: 83 U/L — ABNORMAL HIGH (ref 0–35)
ALT: 94 U/L — ABNORMAL HIGH (ref 0–35)
ALT: 95 U/L — ABNORMAL HIGH (ref 0–35)
AST: 107 U/L — ABNORMAL HIGH (ref 0–37)
AST: 125 U/L — ABNORMAL HIGH (ref 0–37)
AST: 39 U/L — ABNORMAL HIGH (ref 0–37)
AST: 96 U/L — ABNORMAL HIGH (ref 0–37)
Albumin: 3.4 g/dL — ABNORMAL LOW (ref 3.5–5.2)
Alkaline Phosphatase: 97 U/L (ref 39–117)
CO2: 25 mEq/L (ref 19–32)
CO2: 26 mEq/L (ref 19–32)
CO2: 27 mEq/L (ref 19–32)
CO2: 27 mEq/L (ref 19–32)
Chloride: 103 mEq/L (ref 96–112)
Chloride: 104 mEq/L (ref 96–112)
Chloride: 106 mEq/L (ref 96–112)
Chloride: 107 mEq/L (ref 96–112)
Creatinine, Ser: 0.59 mg/dL (ref 0.4–1.2)
Creatinine, Ser: 0.59 mg/dL (ref 0.4–1.2)
GFR calc Af Amer: >60 mL/min (ref >60)
GFR calc Af Amer: >60 mL/min (ref >60)
GFR calc Af Amer: >60 mL/min (ref >60)
GFR calc Af Amer: >60 mL/min (ref >60)
GFR calc non Af Amer: >60 mL/min (ref >60)
GFR calc non Af Amer: >60 mL/min (ref >60)
GFR calc non Af Amer: >60 mL/min (ref >60)
GFR calc non Af Amer: >60 mL/min (ref >60)
Glucose, Bld: 160 mg/dL — ABNORMAL HIGH (ref 70–99)
Potassium: 3.9 mEq/L (ref 3.5–5.1)
Sodium: 135 mEq/L (ref 135–145)
Sodium: 138 mEq/L (ref 135–145)
Sodium: 140 mEq/L (ref 135–145)
Total Bilirubin: 0.5 mg/dL (ref 0.3–1.2)
Total Bilirubin: 0.5 mg/dL (ref 0.3–1.2)
Total Bilirubin: 0.8 mg/dL (ref 0.3–1.2)
Total Bilirubin: 1 mg/dL (ref 0.3–1.2)

## 2010-07-15 LAB — POCT CARDIAC MARKERS: Myoglobin, poc: 45.4 ng/mL (ref 12–200)

## 2010-07-15 LAB — TSH: TSH: 0.386 u[IU]/mL (ref 0.350–4.500)

## 2010-07-15 LAB — HEPATITIS PANEL, ACUTE
HCV Ab: NEGATIVE
Hep A IgM: NEGATIVE

## 2010-07-15 LAB — BASIC METABOLIC PANEL
CO2: 27 mEq/L (ref 19–32)
Calcium: 9 mg/dL (ref 8.4–10.5)
Creatinine, Ser: 0.68 mg/dL (ref 0.4–1.2)
Glucose, Bld: 199 mg/dL — ABNORMAL HIGH (ref 70–99)
Sodium: 139 mEq/L (ref 135–145)

## 2010-07-15 LAB — CBC
Hemoglobin: 12.8 g/dL (ref 12.0–15.0)
MCHC: 35.5 g/dL (ref 30.0–36.0)
MCV: 88.8 fL (ref 78.0–100.0)
MCV: 90 fL (ref 78.0–100.0)
Platelets: 145 10*3/uL — ABNORMAL LOW (ref 150–400)
Platelets: 184 10*3/uL (ref 150–400)
RBC: 3.73 MIL/uL — ABNORMAL LOW (ref 3.87–5.11)
RBC: 4.03 MIL/uL (ref 3.87–5.11)
RBC: 4.06 MIL/uL (ref 3.87–5.11)
RBC: 4.25 MIL/uL (ref 3.87–5.11)
RDW: 13.6 % (ref 11.5–15.5)
WBC: 5.7 10*3/uL (ref 4.0–10.5)
WBC: 5.7 10*3/uL (ref 4.0–10.5)
WBC: 5.8 10*3/uL (ref 4.0–10.5)
WBC: 6.6 10*3/uL (ref 4.0–10.5)

## 2010-07-15 LAB — URINALYSIS, ROUTINE W REFLEX MICROSCOPIC
Bilirubin Urine: NEGATIVE
Glucose, UA: NEGATIVE mg/dL
Hgb urine dipstick: NEGATIVE
Protein, ur: NEGATIVE mg/dL

## 2010-07-15 LAB — GLUCOSE, CAPILLARY
Glucose-Capillary: 149 mg/dL — ABNORMAL HIGH (ref 70–99)
Glucose-Capillary: 162 mg/dL — ABNORMAL HIGH (ref 70–99)
Glucose-Capillary: 176 mg/dL — ABNORMAL HIGH (ref 70–99)
Glucose-Capillary: 223 mg/dL — ABNORMAL HIGH (ref 70–99)

## 2010-07-15 LAB — CARDIAC PANEL(CRET KIN+CKTOT+MB+TROPI): Total CK: 65 U/L (ref 7–177)

## 2010-07-15 LAB — DIFFERENTIAL
Basophils Relative: 1 % (ref 0–1)
Lymphs Abs: 1.6 10*3/uL (ref 0.7–4.0)
Neutro Abs: 4.5 10*3/uL (ref 1.7–7.7)
Neutrophils Relative %: 69 % (ref 43–77)

## 2010-07-15 LAB — CK TOTAL AND CKMB (NOT AT ARMC)
CK, MB: 0.8 ng/mL (ref 0.3–4.0)
CK, MB: 0.8 ng/mL (ref 0.3–4.0)
Total CK: 60 U/L (ref 7–177)

## 2010-07-15 LAB — TROPONIN I: Troponin I: 0.01 ng/mL (ref 0.00–0.06)

## 2010-07-15 LAB — LIPID PANEL: VLDL: 31 mg/dL (ref 0–40)

## 2010-09-09 NOTE — H&P (Signed)
Joy Ward, PONG              ACCOUNT NO.:  [id number]   MEDICAL RECORD NO.:  [id number]          PATIENT TYPE:  INP   LOCATION:  4732                         FACILITY:  [facility]   PHYSICIAN:  Leighton Roach McDiarmid, M.D.DATE OF BIRTH:  March 13, 1941   DATE OF ADMISSION:  10/15/2006  DATE OF DISCHARGE:                              HISTORY & PHYSICAL   PRIMARY CARE PHYSICIAN:  [facility].   CHIEF COMPLAINT:  Watery diarrhea x3 weeks.   HISTORY OF PRESENT ILLNESS:  The patient is a 70 year old, African-  American female who developed profuse, watery diarrhea on Sep 23, 2006.  She has been having anywhere from 5-10 loose stools per day.  She notes  a small amount of blood in her stools last week and stools are currently  very mucus like.  She was initially seen at Urgent Care on September 26, 2006,  and was started on Cipro.  This was discontinued after stool cultures  were negative.  Repeat stool studies obtained this weak at [facility]  came back positive for C. difficile toxin.  Of note, she was taking oral  clindamycin at the beginning of May for bacterial vaginosis.  Today at  [facility], she was clinically dehydrated and orthostatic, so she was  transferred to [facility] for admission.  The C. difficile toxin result  came back today, so she has not had any additional antibiotics.  She had  been trying Imodium and Lomotil which have not helped.   REVIEW OF SYSTEMS:  Positive for crampy abdominal pain, positive nausea,  no vomiting, fevers, chest pain, shortness of breath.  She does have  some lightheadedness.   PAST MEDICAL HISTORY:  1. Hypertension.  2. Diabetes.  3. Multinodular goiter with a negative FNA in March 2008.  4. TIA in 2003.   MEDICATIONS:  1. Hydrochlorothiazide 25 mg daily.  2. Toprol XL 25 mg daily.  3. Vicodin as needed for back pain.   ALLERGIES:  METRONIDAZOLE gives her hives in her throat.  CLINDAMYCIN  caused rash on her neck this past month.   PAST  SURGICAL HISTORY:  1. L4-5 laminectomy.  2. C-section x3.  3. Cholecystectomy.  4. Appendectomy.  5. Total abdominal hysterectomy with bilateral salpingo-oophorectomy.   FAMILY HISTORY:  Mom with diabetes.  She died in a motor vehicle  accident.  Dad died when she was young and she does not know his health  history.  She has multiple siblings including two brothers with  diabetes.  Remainder of siblings are healthy.   SOCIAL HISTORY:  She lives by herself.  She is a retired Scientist, physiological.  She is divorced with seven children, two of whom still live in the area.  She denies any tobacco use since age 53.  No alcohol or other drug use.  She denies any recent travel outside the [place].   PHYSICAL EXAMINATION:  VITAL SIGNS:  (Done at [facility]) Temperature  98.3, pulse 92, blood pressure 118/78, respiratory rate 20.  GENERAL:  She is alert and in no distress.  HEENT:  Mucous membranes are dry.  NECK:  No JVD.  No carotid  bruits, positive thyromegaly right greater  than left.  LUNGS:  Clear to auscultation bilaterally.  CARDIAC:  Regular rate and rhythm.  No murmurs, rubs or gallops.  ABDOMEN:  Positive bowel sounds throughout, soft.  Mild tenderness to  palpation in mid lower aspect of her abdomen.  No rebound no guarding.  EXTREMITIES:  1+ radial and dorsalis pedis pulses bilaterally.  NEUROLOGIC:  Oriented x3.  Cranial nerves 2-12 grossly intact.   LABORATORY DATA AND X-RAY FINDINGS:  Labs obtained at [facility] reveal  a positive C. difficile toxin, negative Giardia, negative  Cryptosporidium.  White blood cell count 8.1, hemoglobin 13.4,  hematocrit 40.7, platelet count 270.  Sodium 141, potassium 3.6,  chloride 106, bicarb 26, BUN 6, creatinine 0.7, glucose 151.  Total  bilirubin 0.5, Alk phos 62, AST 24, ALT 23, albumin 3.5.  TSH was low at  0.073.   ASSESSMENT/PLAN:  A 70 year old, African-American female with  Clostridium difficile toxin positive colitis.   1.  Clostridium difficile positive colitis, presumably from clindamycin      use.  The patient is allergic to metranidazole so we will start      vancomycin 125 mg p.o. four times daily.  Will place on appropriate      contact precautions to minimize spread.  No current signs of severe      infection, but will follow vital signs, CBC and renal function with      serial abdominal exams.  2. Fluids and electrolytes.  The patient is clinically dehydrated.      Will check orthostatic vitals and rehydrate with IV fluids and      diet.  3. Endocrine.  The patient with multinodular goiter and decreased TSH.      Will check FT3 and FT4.  4. Hypertension.  Will continue Toprol XL 25 mg.  Will hold      hydrochlorothiazide while clinically dehydrated.  5. Diabetes.  Will check A1c and place on sliding scale insulin.  6. Status post L4-5 laminectomy with Vicodin as needed for pain.   DISPOSITION:  Pending rehydration and clinical improvement.      Benn Moulder, M.D.    ______________________________  Leighton Roach McDiarmid, M.D.    MR/MEDQ  D:  10/15/2006  T:  10/16/2006  Job:  [id number]

## 2010-09-09 NOTE — Discharge Summary (Signed)
Joy Ward, Joy Ward              ACCOUNT NO.:  [id number]   MEDICAL RECORD NO.:  [id number]          PATIENT TYPE:  INP   LOCATION:  4732                         FACILITY:  [facility]   PHYSICIAN:  Leighton Roach McDiarmid, M.D.DATE OF BIRTH:  [birth year]   DATE OF ADMISSION:  10/15/2006  DATE OF DISCHARGE:  10/18/2006                               DISCHARGE SUMMARY   DISCHARGE DIAGNOSES:  1. Clostridium difficile colitis.  2. Hypertension.  3. Diabetes mellitus.  4. History of transient ischemic attack.  5. Multinodular goiter status post negative fine-needle aspiration.   DISCHARGE MEDICATIONS:  1. Hydrochlorothiazide 25 mg p.o. daily.  2. Toprol XL 25 mg p.o. daily.  3. Vancomycin 125 mg p.o. four times daily through October 29, 2006.   PROCEDURES:  None.   CONSULTATIONS:  None.   LABORATORY DATA:  On admission white blood count of 11.3, hemoglobin of  13.2, hematocrit of 39, platelet count of 256.  Electrolytes on  admission included a sodium of 136, potassium of 2.8, a chloride of 104,  a bicarb of 25, a glucose of 123, BUN of 3, creatinine 0.56, total  bilirubin 1.3, alkaline phosphatase 193, AST 152, ALT 194, total protein  6, albumin 2.8, calcium 8.8.  Free T4 was 1.09, free T3 was 2.7,  hemoglobin A1c was 7.2.  Discharge BMP:  Sodium 142, potassium 3.2,  chloride 111, bicarb 25, glucose 159, BUN 2, creatinine 0.39, total  bilirubin 0.5, alkaline phosphatase 109, AST 27, ALT 69, total protein  5.4, albumin 2.6 and calcium 8.9.   HOSPITAL COURSE:  Joy Ward is a 70 year old African Amercian female  who had developed profuse watery diarrhea on Sep 23, 2006.  She was  diagnosed at [facility] with C. difficile colitis.  This lab result was  obtained on the day of admission and she was sent to [facility] for  admission as she was chronically dehydrated and orthostatic.  1. C. difficile colitis.  At the time of admission, patient had crampy      abdominal pain, nausea,  lightheadedness, orthostatic vital signs      and patient is still having copious watery stools.  Patient was      admitted and rehydrated with IV fluids.  A regular diet was      started.  SHE IS ALLERGIC TO METRONIDAZOLE, so vancomycin 125 mg      p.o. four times daily was started.  Her C. diff colitis was      presumably from clindamycin use for bacterial vaginosis earlier in      the month.  Over the course of the hospital stay, the patient's      stools decreased in frequency and the patient had two normal formed      bowel movements prior to discharge.  She was able to tolerate a      regular diet and was no longer orthostatic or dehydrated.  She is      to continue her p.o. vancomycin through October 29, 2006.  2. Hypertension.  The patient was continued on her Toprol XL 25 mg  daily and her hydrochlorothiazide 25 mg daily with good control of      her hypertension.  3. Diabetes.  The patient has a history of diabetes mellitus, but is      not currently on any medications for these.  Her hemoglobin A1c was      elevated at 7.2.  She is to follow up with her primary care      physician regarding beginning any diabetes medication.  She was      well controlled during the hospital stay with sliding scale      insulin.  4. Multinodular goiter.  Patient had a recent negative fine-needle      aspiration for her multinodular goiter.  We did obtain thyroid      function tests which were all within normal limits.  5. Health maintenance.  The patient states she has never had a      screening colonoscopy and she is 70 years old, so we recommend that      patient undergo screening colonoscopy scheduled by her primary care      physician.   FOLLOW UP:  The patient is to follow up with Joy Ward at [facility]  within the next 1-2 weeks.  She is to call for an appointment.     ______________________________  Levander Campion, M.D.    ______________________________  Leighton Roach McDiarmid,  M.D.    JH/MEDQ  D:  10/23/2006  T:  10/24/2006  Job:  [id number]

## 2011-02-11 LAB — COMPREHENSIVE METABOLIC PANEL
ALT: 194 — ABNORMAL HIGH
ALT: 96 — ABNORMAL HIGH
AST: 27
AST: 37
Albumin: 2.3 — ABNORMAL LOW
Alkaline Phosphatase: 125 — ABNORMAL HIGH
Alkaline Phosphatase: 193 — ABNORMAL HIGH
BUN: 2 — ABNORMAL LOW
BUN: 2 — ABNORMAL LOW
BUN: 3 — ABNORMAL LOW
CO2: 25
CO2: 25
Calcium: 8.8
Calcium: 8.9
Chloride: 104
Creatinine, Ser: 0.39 — ABNORMAL LOW
Creatinine, Ser: 0.54
Creatinine, Ser: 0.56
GFR calc Af Amer: >60
GFR calc Af Amer: >60
GFR calc Af Amer: >60
GFR calc non Af Amer: >60
Glucose, Bld: 109 — ABNORMAL HIGH
Glucose, Bld: 121 — ABNORMAL HIGH
Potassium: 3.8
Sodium: 142
Total Bilirubin: 0.5
Total Bilirubin: 1.2
Total Protein: 5.1 — ABNORMAL LOW
Total Protein: 5.4 — ABNORMAL LOW

## 2011-02-11 LAB — BASIC METABOLIC PANEL
BUN: 1 — ABNORMAL LOW
CO2: 25
Chloride: 106
GFR calc non Af Amer: >60
Glucose, Bld: 131 — ABNORMAL HIGH
Glucose, Bld: 86
Potassium: 2.6 — CL
Potassium: 3.9
Sodium: 137

## 2011-02-11 LAB — CBC
HCT: 33.5 — ABNORMAL LOW
HCT: 39
MCHC: 33.8
MCV: 84.1
MCV: 85.4
Platelets: 220
Platelets: 266
RBC: 4.64
RDW: 13

## 2011-02-11 LAB — T3, FREE: T3, Free: 2.7 (ref 2.3–4.2)

## 2011-02-12 LAB — CBC
HCT: 41.7
Hemoglobin: 14.1
MCHC: 33.8
RBC: 4.84
RDW: 12.3

## 2011-02-12 LAB — STOOL CULTURE

## 2011-02-12 LAB — I-STAT 8, (EC8 V) (CONVERTED LAB)
Acid-Base Excess: 7 — ABNORMAL HIGH
Chloride: 101
Glucose, Bld: 155 — ABNORMAL HIGH
Potassium: 3.4 — ABNORMAL LOW
TCO2: 33
pCO2, Ven: 46
pH, Ven: 7.45 — ABNORMAL HIGH

## 2011-02-12 LAB — CLOSTRIDIUM DIFFICILE EIA: C difficile Toxins A+B, EIA: NEGATIVE

## 2011-02-12 LAB — DIFFERENTIAL
Basophils Absolute: 0
Basophils Relative: 0
Eosinophils Relative: 3
Monocytes Absolute: 0.8 — ABNORMAL HIGH
Monocytes Relative: 8

## 2015-05-17 DIAGNOSIS — L509 Urticaria, unspecified: Secondary | ICD-10-CM | POA: Diagnosis not present

## 2015-05-17 DIAGNOSIS — L309 Dermatitis, unspecified: Secondary | ICD-10-CM | POA: Diagnosis not present

## 2015-05-17 DIAGNOSIS — D229 Melanocytic nevi, unspecified: Secondary | ICD-10-CM | POA: Diagnosis not present

## 2015-05-31 DIAGNOSIS — D2272 Melanocytic nevi of left lower limb, including hip: Secondary | ICD-10-CM | POA: Diagnosis not present

## 2015-05-31 DIAGNOSIS — D485 Neoplasm of uncertain behavior of skin: Secondary | ICD-10-CM | POA: Diagnosis not present

## 2015-05-31 DIAGNOSIS — L308 Other specified dermatitis: Secondary | ICD-10-CM | POA: Diagnosis not present

## 2015-06-19 DIAGNOSIS — L308 Other specified dermatitis: Secondary | ICD-10-CM | POA: Diagnosis not present

## 2015-06-19 DIAGNOSIS — D2272 Melanocytic nevi of left lower limb, including hip: Secondary | ICD-10-CM | POA: Diagnosis not present

## 2015-06-21 DIAGNOSIS — L308 Other specified dermatitis: Secondary | ICD-10-CM | POA: Diagnosis not present

## 2015-06-21 DIAGNOSIS — L309 Dermatitis, unspecified: Secondary | ICD-10-CM | POA: Diagnosis not present

## 2015-06-21 DIAGNOSIS — S0006XA Insect bite (nonvenomous) of scalp, initial encounter: Secondary | ICD-10-CM | POA: Diagnosis not present

## 2015-06-27 DIAGNOSIS — E119 Type 2 diabetes mellitus without complications: Secondary | ICD-10-CM | POA: Diagnosis not present

## 2015-06-27 DIAGNOSIS — G4700 Insomnia, unspecified: Secondary | ICD-10-CM | POA: Diagnosis not present

## 2015-06-27 DIAGNOSIS — I10 Essential (primary) hypertension: Secondary | ICD-10-CM | POA: Diagnosis not present

## 2015-06-27 DIAGNOSIS — M129 Arthropathy, unspecified: Secondary | ICD-10-CM | POA: Diagnosis not present

## 2015-06-27 DIAGNOSIS — E782 Mixed hyperlipidemia: Secondary | ICD-10-CM | POA: Diagnosis not present

## 2015-07-03 DIAGNOSIS — L309 Dermatitis, unspecified: Secondary | ICD-10-CM | POA: Diagnosis not present

## 2015-07-11 DIAGNOSIS — R05 Cough: Secondary | ICD-10-CM | POA: Diagnosis not present

## 2015-07-11 DIAGNOSIS — R062 Wheezing: Secondary | ICD-10-CM | POA: Diagnosis not present

## 2015-07-11 DIAGNOSIS — J019 Acute sinusitis, unspecified: Secondary | ICD-10-CM | POA: Diagnosis not present

## 2015-07-11 DIAGNOSIS — J069 Acute upper respiratory infection, unspecified: Secondary | ICD-10-CM | POA: Diagnosis not present

## 2015-08-14 DIAGNOSIS — H35371 Puckering of macula, right eye: Secondary | ICD-10-CM | POA: Diagnosis not present

## 2015-08-22 DIAGNOSIS — Z0000 Encounter for general adult medical examination without abnormal findings: Secondary | ICD-10-CM | POA: Diagnosis not present

## 2015-09-19 DIAGNOSIS — H409 Unspecified glaucoma: Secondary | ICD-10-CM | POA: Diagnosis not present

## 2015-09-19 DIAGNOSIS — H2511 Age-related nuclear cataract, right eye: Secondary | ICD-10-CM | POA: Diagnosis not present

## 2015-09-19 DIAGNOSIS — H538 Other visual disturbances: Secondary | ICD-10-CM | POA: Diagnosis not present

## 2015-10-01 DIAGNOSIS — M7062 Trochanteric bursitis, left hip: Secondary | ICD-10-CM | POA: Diagnosis not present

## 2015-10-01 DIAGNOSIS — M7061 Trochanteric bursitis, right hip: Secondary | ICD-10-CM | POA: Diagnosis not present

## 2015-10-04 DIAGNOSIS — J019 Acute sinusitis, unspecified: Secondary | ICD-10-CM | POA: Diagnosis not present

## 2015-10-04 DIAGNOSIS — J0100 Acute maxillary sinusitis, unspecified: Secondary | ICD-10-CM | POA: Diagnosis not present

## 2015-10-04 DIAGNOSIS — J309 Allergic rhinitis, unspecified: Secondary | ICD-10-CM | POA: Diagnosis not present

## 2015-11-12 DIAGNOSIS — E784 Other hyperlipidemia: Secondary | ICD-10-CM | POA: Diagnosis not present

## 2015-11-12 DIAGNOSIS — E119 Type 2 diabetes mellitus without complications: Secondary | ICD-10-CM | POA: Diagnosis not present

## 2015-11-12 DIAGNOSIS — I10 Essential (primary) hypertension: Secondary | ICD-10-CM | POA: Diagnosis not present

## 2015-11-12 DIAGNOSIS — Z1239 Encounter for other screening for malignant neoplasm of breast: Secondary | ICD-10-CM | POA: Diagnosis not present

## 2015-11-12 DIAGNOSIS — E039 Hypothyroidism, unspecified: Secondary | ICD-10-CM | POA: Diagnosis not present

## 2015-11-22 ENCOUNTER — Other Ambulatory Visit: Payer: Self-pay | Admitting: Internal Medicine

## 2015-11-22 DIAGNOSIS — Z1231 Encounter for screening mammogram for malignant neoplasm of breast: Secondary | ICD-10-CM

## 2015-12-13 ENCOUNTER — Ambulatory Visit: Payer: Self-pay

## 2016-01-02 ENCOUNTER — Ambulatory Visit
Admission: RE | Admit: 2016-01-02 | Discharge: 2016-01-02 | Disposition: A | Discharge status: Home / Self Care | Payer: Medicare Other | Source: Ambulatory Visit | Attending: Internal Medicine | Admitting: Internal Medicine

## 2016-01-02 DIAGNOSIS — Z1231 Encounter for screening mammogram for malignant neoplasm of breast: Secondary | ICD-10-CM

## 2016-01-09 DIAGNOSIS — G894 Chronic pain syndrome: Secondary | ICD-10-CM | POA: Diagnosis not present

## 2016-01-09 DIAGNOSIS — J019 Acute sinusitis, unspecified: Secondary | ICD-10-CM | POA: Diagnosis not present

## 2016-01-09 DIAGNOSIS — M179 Osteoarthritis of knee, unspecified: Secondary | ICD-10-CM | POA: Diagnosis not present

## 2016-01-09 DIAGNOSIS — E119 Type 2 diabetes mellitus without complications: Secondary | ICD-10-CM | POA: Diagnosis not present

## 2016-01-09 DIAGNOSIS — I10 Essential (primary) hypertension: Secondary | ICD-10-CM | POA: Diagnosis not present

## 2016-01-09 DIAGNOSIS — M5126 Other intervertebral disc displacement, lumbar region: Secondary | ICD-10-CM | POA: Diagnosis not present

## 2016-01-21 DIAGNOSIS — M7062 Trochanteric bursitis, left hip: Secondary | ICD-10-CM | POA: Diagnosis not present

## 2016-01-21 DIAGNOSIS — M7061 Trochanteric bursitis, right hip: Secondary | ICD-10-CM | POA: Diagnosis not present

## 2016-01-24 DIAGNOSIS — M5136 Other intervertebral disc degeneration, lumbar region: Secondary | ICD-10-CM | POA: Diagnosis not present

## 2016-01-24 DIAGNOSIS — M170 Bilateral primary osteoarthritis of knee: Secondary | ICD-10-CM | POA: Diagnosis not present

## 2016-01-24 DIAGNOSIS — M11269 Other chondrocalcinosis, unspecified knee: Secondary | ICD-10-CM | POA: Diagnosis not present

## 2016-01-29 DIAGNOSIS — Z79891 Long term (current) use of opiate analgesic: Secondary | ICD-10-CM | POA: Diagnosis not present

## 2016-01-29 DIAGNOSIS — Z23 Encounter for immunization: Secondary | ICD-10-CM | POA: Diagnosis not present

## 2016-01-29 DIAGNOSIS — I10 Essential (primary) hypertension: Secondary | ICD-10-CM | POA: Diagnosis not present

## 2016-01-29 DIAGNOSIS — G8929 Other chronic pain: Secondary | ICD-10-CM | POA: Diagnosis not present

## 2016-01-29 DIAGNOSIS — M545 Low back pain: Secondary | ICD-10-CM | POA: Diagnosis not present

## 2016-01-29 DIAGNOSIS — E119 Type 2 diabetes mellitus without complications: Secondary | ICD-10-CM | POA: Diagnosis not present

## 2016-02-04 DIAGNOSIS — M5136 Other intervertebral disc degeneration, lumbar region: Secondary | ICD-10-CM | POA: Diagnosis not present

## 2016-02-10 DIAGNOSIS — M5136 Other intervertebral disc degeneration, lumbar region: Secondary | ICD-10-CM | POA: Diagnosis not present

## 2016-02-10 DIAGNOSIS — M4800 Spinal stenosis, site unspecified: Secondary | ICD-10-CM | POA: Diagnosis not present

## 2016-02-10 DIAGNOSIS — M9983 Other biomechanical lesions of lumbar region: Secondary | ICD-10-CM | POA: Diagnosis not present

## 2016-02-20 DIAGNOSIS — H2513 Age-related nuclear cataract, bilateral: Secondary | ICD-10-CM | POA: Diagnosis not present

## 2016-02-20 DIAGNOSIS — E119 Type 2 diabetes mellitus without complications: Secondary | ICD-10-CM | POA: Diagnosis not present

## 2016-02-20 DIAGNOSIS — H401113 Primary open-angle glaucoma, right eye, severe stage: Secondary | ICD-10-CM | POA: Diagnosis not present

## 2016-03-04 ENCOUNTER — Ambulatory Visit (INDEPENDENT_AMBULATORY_CARE_PROVIDER_SITE_OTHER): Payer: Medicare Other | Admitting: Internal Medicine

## 2016-03-04 ENCOUNTER — Ambulatory Visit: Payer: Self-pay | Admitting: Internal Medicine

## 2016-03-04 ENCOUNTER — Encounter: Payer: Self-pay | Admitting: Internal Medicine

## 2016-03-04 VITALS — BP 130/78 | HR 82 | Ht 63.0 in | Wt 187.0 lb

## 2016-03-04 DIAGNOSIS — E782 Mixed hyperlipidemia: Secondary | ICD-10-CM

## 2016-03-04 DIAGNOSIS — E119 Type 2 diabetes mellitus without complications: Secondary | ICD-10-CM | POA: Diagnosis not present

## 2016-03-04 DIAGNOSIS — Z79899 Other long term (current) drug therapy: Secondary | ICD-10-CM

## 2016-03-04 DIAGNOSIS — J3089 Other allergic rhinitis: Secondary | ICD-10-CM

## 2016-03-04 DIAGNOSIS — I10 Essential (primary) hypertension: Secondary | ICD-10-CM

## 2016-03-04 LAB — GLUCOSE, POCT (MANUAL RESULT ENTRY): POC GLUCOSE: 192 mg/dL — AB (ref 70–99)

## 2016-03-04 NOTE — Progress Notes (Signed)
Subjective:    Patient ID: Joy Ward, female    DOB: May 12, 1940, 75 y.o.   MRN: [id number]  HPI  1.  Essential Hypertension  2.  Chronic insomnia:  For years.  Has been on Ambien since 2010.  3.  History of stroke:  History of terrible headache.  Had a CT scan of brain and found "ministroke"  Was removed from ERT subsequently.    4.  Hyperlipidemia:  Muscle pain with statins.  Only takes fish oil currently.  5.  DM 2:  Diagnosed some years ago. Only takes Glimepiride.  States gained weight with Metformin.   Checks sugars and in mornings fasting runs 105-125.  Highest is 250 later in day.  Generally stays less than 200.  Last A1C she recalls was 7.5%. Last eye exam with Dr. Lanell Matar mid October.   Has a bit of glaucoma bilaterally.  She is not aware of any diabetic changes. Did have flu vaccine Did have pneumococcal vaccine in past x 2.  Not clear if received PCV 13. Does have numbness and tingling at times in feet. No nephropathy she is aware of. No known CAD  6.  Surgical hypothyroidism:  Had multinodular goiter removed, though not clear if she also had hyperthyroidism.    7.  Allergies:  Year round.  Uses Xyzal generic and also saline nasal spray.  Does have another spray, does not use regularly.  Cannot recall name    Current Meds  Medication Sig  . amLODipine (NORVASC) 5 MG tablet Take 5 mg by mouth daily.  . diclofenac sodium (VOLTAREN) 1 % GEL Apply 4 g topically 4 (four) times daily.  Marland Kitchen glimepiride (AMARYL) 1 MG tablet Take 1 mg by mouth daily with breakfast.  . hydrochlorothiazide (HYDRODIURIL) 25 MG tablet Take 25 mg by mouth daily.  Marland Kitchen HYDROcodone-acetaminophen (NORCO) 10-325 MG tablet Take 1 tablet by mouth every 6 (six) hours as needed.  . latanoprost (XALATAN) 0.005 % ophthalmic solution Place 1 drop into both eyes at bedtime.  Marland Kitchen levocetirizine (XYZAL) 5 MG tablet Take 5 mg by mouth every evening.  Marland Kitchen levothyroxine (SYNTHROID, LEVOTHROID) 88 MCG tablet Take 88  mcg by mouth daily before breakfast.  . losartan (COZAAR) 100 MG tablet Take 100 mg by mouth daily.  . Multiple Vitamin (MULTIVITAMINS PO) Take 1 tablet by mouth daily.  . Nutritional Supplements (ESTROVEN PO) Take 1 tablet by mouth daily.  . Omega-3 Fatty Acids (FISH OIL) 1000 MG CAPS Take 1 capsule by mouth 2 (two) times daily.  . potassium chloride SA (K-DUR,KLOR-CON) 20 MEQ tablet Take 20 mEq by mouth daily.  . ranitidine (ZANTAC) 150 MG tablet Take 150 mg by mouth 2 (two) times daily.  . traMADol (ULTRAM) 50 MG tablet Take 50 mg by mouth every 12 (twelve) hours as needed.  . zolpidem (AMBIEN) 5 MG tablet Take 5 mg by mouth at bedtime as needed for sleep.   Allergies  Allergen Reactions  . Ace Inhibitors     REACTION: cough  . Clindamycin Hcl     REACTION: C. difficile colitis  . Levothyroxine Sodium     REACTION: Intolerance 3/08. Patient was placed on this to suppress the thyroid nodule with previously normal TSH.  . Metronidazole     REACTION: hives    Past Medical History:  Diagnosis Date  . Allergy   . Arthritis   . Diabetes mellitus without complication ([place])   . Former tobacco use   . Hyperlipidemia    a.  H/o muscle aches with statins per PCP notes.  . Hypertension   . Hypothyroidism    a. prior thyroid removal.  . Stroke ([place])    a. Listed in PCP notes: "History of stroke:  History of terrible headache.  Had a CT scan of brain and found "ministroke"  Was removed from ERT subsequently. "   Past Surgical History:  Procedure Laterality Date  . ABDOMINAL HYSTERECTOMY  1994   TAH, not clear if unilateral oophorectomy as well.  Marland Kitchen [place]  . CHOLECYSTECTOMY  1973   open  . DECOMPRESSIVE LUMBAR LAMINECTOMY LEVEL 4  2005   In [place]  . THYROIDECTOMY  2012   multinodular goiter    Social History   Social History  . Marital status: Single    Spouse name: N/A  . Number of children: 7  . Years of education: 59   Occupational  History  . pastor--travels a lot.    Social History Main Topics  . Smoking status: Former Research scientist (life sciences)  . Smokeless tobacco: Never Used     Comment: No smoking since 75 yo - smoked 10 yrs  . Alcohol use No  . Drug use: No  . Sexual activity: Not on file   Other Topics Concern  . Not on file   Social History Narrative   Lives alone in [place].   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   Family History  Problem Relation Age of Onset  . Heart disease Mother     unclear details "atherosclerosis"  . Diabetes Mother   . Alcohol abuse Sister   . Diabetes Brother   . Diabetes Brother   . Drug abuse Brother   . Diabetes Daughter   . Heart disease Son     CHF  . Alcohol abuse Son      Review of Systems     Objective:   Physical Exam  NAD HEENT:  PERRL, EOMI, TMs pearly gray, throat without injection. Neck:  Supple, No adenopathy, not thyroid area mass  Chest:  CTA CV:  RRR with normal S1 and S2, No S3, S4 or murmur.  No carotid bruit.  Carotid, radial and DP pulses normal and equal Abd:  S, NT, No HSM or mass, + BS LE:  No edema        Assessment & Plan:  1.  Essential Hypertension:  Controlled.  CMP  2.  DM Type 2:  Call back with meds need refilled.   Check MMSE next visit.  A1C today  3.  Allergies:  To call in name of nasal spray--assuming corticosteroid--if so, to use regularly, not prn.  4.  Patient concerned for memory:  Discussed at end of visit:  Will perform MMSE next visit.  5.  Hypothyroidism:  Post surgical for benign disease.  6.  Insomnia:  Treated chronically  7.  Hyperlipidemia:  Nonfasting today. On fish oil caps only  8.  History of stroke  Send for records  To call in what meds she needs refilled.  Did not have with her today.  Follow up in 3 months

## 2016-03-05 LAB — CBC WITH DIFFERENTIAL/PLATELET
BASOS ABS: 0 10*3/uL (ref 0.0–0.2)
Basos: 1 %
EOS (ABSOLUTE): 0.1 10*3/uL (ref 0.0–0.4)
Eos: 2 %
Hematocrit: 39 % (ref 34.0–46.6)
Hemoglobin: 13.5 g/dL (ref 11.1–15.9)
Immature Grans (Abs): 0 10*3/uL (ref 0.0–0.1)
Immature Granulocytes: 0 %
LYMPHS ABS: 1.8 10*3/uL (ref 0.7–3.1)
LYMPHS: 30 %
MCH: 30.5 pg (ref 26.6–33.0)
MCHC: 34.6 g/dL (ref 31.5–35.7)
MCV: 88 fL (ref 79–97)
MONOS ABS: 0.5 10*3/uL (ref 0.1–0.9)
Monocytes: 8 %
NEUTROS ABS: 3.6 10*3/uL (ref 1.4–7.0)
Neutrophils: 59 %
PLATELETS: 231 10*3/uL (ref 150–379)
RBC: 4.42 x10E6/uL (ref 3.77–5.28)
RDW: 14.1 % (ref 12.3–15.4)
WBC: 6 10*3/uL (ref 3.4–10.8)

## 2016-03-05 LAB — COMPREHENSIVE METABOLIC PANEL
A/G RATIO: 1.6 (ref 1.2–2.2)
ALT: 45 IU/L — AB (ref 0–32)
AST: 27 IU/L (ref 0–40)
Albumin: 4.4 g/dL (ref 3.5–4.8)
Alkaline Phosphatase: 72 IU/L (ref 39–117)
BILIRUBIN TOTAL: 0.5 mg/dL (ref 0.0–1.2)
BUN / CREAT RATIO: 17 (ref 12–28)
BUN: 12 mg/dL (ref 8–27)
CHLORIDE: 98 mmol/L (ref 96–106)
CO2: 26 mmol/L (ref 18–29)
Calcium: 10.1 mg/dL (ref 8.7–10.3)
Creatinine, Ser: 0.7 mg/dL (ref 0.57–1.00)
GFR calc non Af Amer: 86 mL/min/{1.73_m2} (ref >59)
GFR, EST AFRICAN AMERICAN: 99 mL/min/{1.73_m2} (ref >59)
GLOBULIN, TOTAL: 2.8 g/dL (ref 1.5–4.5)
Glucose: 164 mg/dL — ABNORMAL HIGH (ref 65–99)
POTASSIUM: 4.3 mmol/L (ref 3.5–5.2)
SODIUM: 141 mmol/L (ref 134–144)
TOTAL PROTEIN: 7.2 g/dL (ref 6.0–8.5)

## 2016-03-05 LAB — HGB A1C W/O EAG: HEMOGLOBIN A1C: 7.7 % — AB (ref 4.8–5.6)

## 2016-03-09 DIAGNOSIS — M961 Postlaminectomy syndrome, not elsewhere classified: Secondary | ICD-10-CM | POA: Diagnosis not present

## 2016-03-09 DIAGNOSIS — M5136 Other intervertebral disc degeneration, lumbar region: Secondary | ICD-10-CM | POA: Diagnosis not present

## 2016-03-09 DIAGNOSIS — M48061 Spinal stenosis, lumbar region without neurogenic claudication: Secondary | ICD-10-CM | POA: Diagnosis not present

## 2016-03-11 ENCOUNTER — Telehealth: Payer: Self-pay | Admitting: Internal Medicine

## 2016-03-11 NOTE — Telephone Encounter (Signed)
Patient called requesting to speak to Dr. Amil Amen about her medications. States she has been calling and no answers the phone. Patient informed we only have one line so we ask patient to leave a voice message and if they don't we have no way of knowing they called. Patient wants to speak directly to Dr. Amil Amen about her medications

## 2016-03-13 NOTE — Telephone Encounter (Signed)
Called and left message to call clinic back and would be happy to speak with her.

## 2016-03-16 NOTE — Telephone Encounter (Signed)
Called and left message again to call clinic

## 2016-03-17 NOTE — Progress Notes (Signed)
Joy Ward made several attempts to deliver message to patient.  Left messages for patient to call as well left message for patient contact as directed on HIPPA authorization form.

## 2016-03-17 NOTE — Progress Notes (Signed)
Pat called patient to relay message from Dr. Amil Amen.  Patient's daughter called to get message from Dr. Amil Amen.  Message was relayed and patient's daughter stated to call Rx into [facility] on [address].

## 2016-03-18 DIAGNOSIS — H2513 Age-related nuclear cataract, bilateral: Secondary | ICD-10-CM | POA: Diagnosis not present

## 2016-03-18 DIAGNOSIS — H401232 Low-tension glaucoma, bilateral, moderate stage: Secondary | ICD-10-CM | POA: Diagnosis not present

## 2016-03-18 DIAGNOSIS — E119 Type 2 diabetes mellitus without complications: Secondary | ICD-10-CM | POA: Diagnosis not present

## 2016-04-05 NOTE — Telephone Encounter (Signed)
Have tried to call numerous times to speak with patient.  She has not returned our calls.

## 2016-04-14 DIAGNOSIS — M48061 Spinal stenosis, lumbar region without neurogenic claudication: Secondary | ICD-10-CM | POA: Diagnosis not present

## 2016-04-14 DIAGNOSIS — M961 Postlaminectomy syndrome, not elsewhere classified: Secondary | ICD-10-CM | POA: Diagnosis not present

## 2016-04-17 DIAGNOSIS — D519 Vitamin B12 deficiency anemia, unspecified: Secondary | ICD-10-CM | POA: Diagnosis not present

## 2016-04-17 DIAGNOSIS — E1140 Type 2 diabetes mellitus with diabetic neuropathy, unspecified: Secondary | ICD-10-CM | POA: Diagnosis not present

## 2016-04-17 DIAGNOSIS — E876 Hypokalemia: Secondary | ICD-10-CM | POA: Diagnosis not present

## 2016-04-17 DIAGNOSIS — J019 Acute sinusitis, unspecified: Secondary | ICD-10-CM | POA: Diagnosis not present

## 2016-04-17 DIAGNOSIS — R5383 Other fatigue: Secondary | ICD-10-CM | POA: Diagnosis not present

## 2016-04-21 ENCOUNTER — Encounter (HOSPITAL_COMMUNITY): Payer: Self-pay | Admitting: Emergency Medicine

## 2016-04-21 ENCOUNTER — Observation Stay (HOSPITAL_COMMUNITY)
Admission: EM | Admit: 2016-04-21 | Discharge: 2016-04-23 | Disposition: A | Discharge status: Home / Self Care | Payer: Medicare Other | Attending: Internal Medicine | Admitting: Internal Medicine

## 2016-04-21 DIAGNOSIS — R079 Chest pain, unspecified: Secondary | ICD-10-CM | POA: Diagnosis not present

## 2016-04-21 DIAGNOSIS — Z79899 Other long term (current) drug therapy: Secondary | ICD-10-CM | POA: Diagnosis not present

## 2016-04-21 DIAGNOSIS — Z7984 Long term (current) use of oral hypoglycemic drugs: Secondary | ICD-10-CM | POA: Diagnosis not present

## 2016-04-21 DIAGNOSIS — M1990 Unspecified osteoarthritis, unspecified site: Secondary | ICD-10-CM | POA: Diagnosis not present

## 2016-04-21 DIAGNOSIS — Z885 Allergy status to narcotic agent status: Secondary | ICD-10-CM | POA: Insufficient documentation

## 2016-04-21 DIAGNOSIS — Z883 Allergy status to other anti-infective agents status: Secondary | ICD-10-CM | POA: Diagnosis not present

## 2016-04-21 DIAGNOSIS — Z881 Allergy status to other antibiotic agents status: Secondary | ICD-10-CM | POA: Insufficient documentation

## 2016-04-21 DIAGNOSIS — Z791 Long term (current) use of non-steroidal anti-inflammatories (NSAID): Secondary | ICD-10-CM | POA: Insufficient documentation

## 2016-04-21 DIAGNOSIS — E1165 Type 2 diabetes mellitus with hyperglycemia: Secondary | ICD-10-CM

## 2016-04-21 DIAGNOSIS — E890 Postprocedural hypothyroidism: Secondary | ICD-10-CM | POA: Insufficient documentation

## 2016-04-21 DIAGNOSIS — Z91048 Other nonmedicinal substance allergy status: Secondary | ICD-10-CM | POA: Diagnosis not present

## 2016-04-21 DIAGNOSIS — R011 Cardiac murmur, unspecified: Secondary | ICD-10-CM | POA: Diagnosis not present

## 2016-04-21 DIAGNOSIS — E119 Type 2 diabetes mellitus without complications: Secondary | ICD-10-CM | POA: Diagnosis not present

## 2016-04-21 DIAGNOSIS — IMO0002 Reserved for concepts with insufficient information to code with codable children: Secondary | ICD-10-CM

## 2016-04-21 DIAGNOSIS — Z87891 Personal history of nicotine dependence: Secondary | ICD-10-CM | POA: Insufficient documentation

## 2016-04-21 DIAGNOSIS — M7121 Synovial cyst of popliteal space [Baker], right knee: Secondary | ICD-10-CM | POA: Insufficient documentation

## 2016-04-21 DIAGNOSIS — E785 Hyperlipidemia, unspecified: Secondary | ICD-10-CM

## 2016-04-21 DIAGNOSIS — Z9889 Other specified postprocedural states: Secondary | ICD-10-CM | POA: Diagnosis not present

## 2016-04-21 DIAGNOSIS — R0789 Other chest pain: Secondary | ICD-10-CM | POA: Diagnosis not present

## 2016-04-21 DIAGNOSIS — I10 Essential (primary) hypertension: Secondary | ICD-10-CM | POA: Diagnosis not present

## 2016-04-21 HISTORY — DX: Personal history of nicotine dependence: Z87.891

## 2016-04-21 HISTORY — DX: Cerebral infarction, unspecified: I63.9

## 2016-04-21 HISTORY — DX: Hypothyroidism, unspecified: E03.9

## 2016-04-21 HISTORY — DX: Hyperlipidemia, unspecified: E78.5

## 2016-04-21 HISTORY — DX: Unspecified osteoarthritis, unspecified site: M19.90

## 2016-04-21 NOTE — ED Triage Notes (Signed)
Per EMS, pt from home with c/o chest pain/pressure beginning at 1500 today. Pain increasing throughout the day and radiating to the left shoulder/arm. Pt given 1 NTG and 324 aspirin PTA. Chest pressure relieved by NTG, shoulder/arm pain continues. BP-137/88, HR-82, RR-16, SpO2-98% room air.

## 2016-04-21 NOTE — ED Provider Notes (Signed)
[facility] DEPT Provider Note   CSN: [id number] Arrival date & time: 04/21/16  2333   By signing my name below, I, Neta Mends, attest that this documentation has been prepared under the direction and in the presence of Forde Dandy, MD . Electronically Signed: Neta Mends, ED Scribe. 04/22/2016. 1:44 AM.   History   Chief Complaint Chief Complaint  Patient presents with  . Chest Pain    The history is provided by the patient. No language interpreter was used.   HPI Comments:  Joy Ward is a 75 y.o. female who presents to the Emergency Department via EMS complaining of intermittent chest pain that began at 1500 yesterday. Pt reports that she came home yesterday afternoon to eat and began having chest pain which she states is similar to GERD. Pt complains of associated intermittent left arm pain. Pt notes that she has been having mild sinus congestion over the past several days. Pt reports that she had a stress test in 2012, and that the results were "very good." Pt took 2 325mg  aspirin with mild relief.  Pt denies nausea, vomiting, diarrhea, abdominal pain, SOB, diaphoresis.    Past Medical History:  Diagnosis Date  . Allergy   . Diabetes mellitus without complication ([place])   . Hypertension   . Thyroid disease     Patient Active Problem List   Diagnosis Date Noted  . Chest pain 04/22/2016  . HYPOKALEMIA 01/24/2010  . CONSTIPATION 12/23/2009  . INTERMITTENT VERTIGO 12/23/2009  . DIARRHEA 11/08/2009  . SINUSITIS, ACUTE 04/25/2009  . VAGINITIS, CANDIDAL 02/14/2009  . HIP PAIN, RIGHT 02/14/2009  . COUGH 01/25/2009  . DYSPHAGIA UNSPECIFIED 01/25/2009  . HYPERLIPIDEMIA, MIXED 01/01/2009  . BACK PAIN, LUMBAR, WITH RADICULOPATHY 12/25/2008  . KNEE PAIN, BILATERAL 12/18/2008  . TROCHANTERIC BURSITIS, BILATERAL 12/18/2008  . SLEEP DISORDER, CHRONIC 12/18/2008  . ONYCHOMYCOSIS, TOENAILS 10/30/2008  . ENTERITIS, CLOSTRIDIUM DIFFICILE 10/15/2006  .  Diabetes mellitus type 2, controlled, without complications ([place]) [id number]  . HYPERTENSION 09/18/2006  . ALLERGIC RHINITIS 09/18/2006  . GOITER, MULTINODULAR 06/29/2006  . STROKE 04/27/2001    Past Surgical History:  Procedure Laterality Date  . ABDOMINAL HYSTERECTOMY  1994   TAH, not clear if unilateral oophorectomy as well.  [facility] [place]  . CHOLECYSTECTOMY  1973   open  . DECOMPRESSIVE LUMBAR LAMINECTOMY LEVEL 4  2005   In [place]  . THYROIDECTOMY  2012   multinodular goiter    OB History    No data available       Home Medications    Prior to Admission medications   Medication Sig Start Date End Date Taking? Authorizing Provider  amLODipine (NORVASC) 5 MG tablet Take 5 mg by mouth daily.   Yes Historical Provider, MD  diclofenac sodium (VOLTAREN) 1 % GEL Apply 4 g topically 2 (two) times daily.    Yes Historical Provider, MD  glimepiride (AMARYL) 1 MG tablet Take 2 mg by mouth daily with breakfast.    Yes Historical Provider, MD  hydrochlorothiazide (HYDRODIURIL) 25 MG tablet Take 25 mg by mouth daily.   Yes Historical Provider, MD  HYDROcodone-acetaminophen (NORCO) 10-325 MG tablet Take 1 tablet by mouth every 6 (six) hours as needed.   Yes Historical Provider, MD  levocetirizine (XYZAL) 5 MG tablet Take 5 mg by mouth daily.    Yes Historical Provider, MD  levothyroxine (SYNTHROID, LEVOTHROID) 88 MCG tablet Take 88 mcg by mouth daily before breakfast.   Yes Historical  Provider, MD  losartan (COZAAR) 100 MG tablet Take 100 mg by mouth daily.   Yes Historical Provider, MD  LUMIGAN 0.01 % SOLN Place 1 drop into the left eye at bedtime. 04/14/16  Yes Historical Provider, MD  meloxicam (MOBIC) 7.5 MG tablet Take 7.5 mg by mouth daily. 03/27/16  Yes Historical Provider, MD  Multiple Vitamin (MULTIVITAMINS PO) Take 1 tablet by mouth daily.   Yes Historical Provider, MD  Nutritional Supplements (ESTROVEN PO) Take 1 tablet by mouth daily.   Yes  Historical Provider, MD  Omega-3 Fatty Acids (FISH OIL) 1000 MG CAPS Take 1 capsule by mouth 2 (two) times daily.   Yes Historical Provider, MD  potassium chloride SA (K-DUR,KLOR-CON) 20 MEQ tablet Take 20 mEq by mouth daily.   Yes Historical Provider, MD  ranitidine (ZANTAC) 150 MG tablet Take 150 mg by mouth every morning.    Yes Historical Provider, MD  traMADol (ULTRAM) 50 MG tablet Take 50 mg by mouth every 12 (twelve) hours as needed.   Yes Historical Provider, MD  zolpidem (AMBIEN) 5 MG tablet Take 5 mg by mouth at bedtime as needed for sleep.   Yes Historical Provider, MD    Family History Family History  Problem Relation Age of Onset  . Heart disease Mother   . Diabetes Mother   . Alcohol abuse Sister   . Diabetes Brother   . Diabetes Brother   . Drug abuse Brother   . Diabetes Daughter   . Heart disease Son     CHF  . Alcohol abuse Son     Social History Social History  Substance Use Topics  . Smoking status: Former Research scientist (life sciences)  . Smokeless tobacco: Never Used     Comment: No smoking since 75 yo  . Alcohol use No     Allergies   Clindamycin hcl; Metronidazole; Trazodone and nefazodone; Ace inhibitors; Adhesive [tape]; and Levothyroxine sodium   Review of Systems Review of Systems 10 Systems reviewed and are negative for acute change except as noted in the HPI.   Physical Exam Updated Vital Signs BP 105/72   Pulse 65   Temp 97.9 F (36.6 C) (Oral)   Resp 16   SpO2 100%   Physical Exam Physical Exam  Nursing note and vitals reviewed. Constitutional: Well developed, well nourished, non-toxic, and in no acute distress Head: Normocephalic and atraumatic.  Mouth/Throat: Oropharynx is clear and moist.  Neck: Normal range of motion. Neck supple.  Cardiovascular: Normal rate and regular rhythm.   Pulmonary/Chest: Effort normal and breath sounds normal.  Abdominal: Soft. There is no tenderness. There is no rebound and no guarding.  Musculoskeletal: Normal range  of motion.  Neurological: Alert, no facial droop, fluent speech, moves all extremities symmetrically Skin: Skin is warm and dry.  Psychiatric: Cooperative   ED Treatments / Results  DIAGNOSTIC STUDIES:  Oxygen Saturation is 100% on RA, normal by my interpretation.    COORDINATION OF CARE:  12:02 AM Discussed treatment plan with pt at bedside and pt agreed to plan.   Labs (all labs ordered are listed, but only abnormal results are displayed) Labs Reviewed  CBC WITH DIFFERENTIAL/PLATELET - Abnormal; Notable for the following:       Result Value   HCT 34.8 (*)    All other components within normal limits  BASIC METABOLIC PANEL - Abnormal; Notable for the following:    Chloride 100 (*)    Glucose, Bld 173 (*)    Calcium 10.7 (*)  All other components within normal limits  I-STAT TROPOININ, ED    EKG  EKG Interpretation  Date/Time:  Tuesday April 21 2016 23:42:59 EST Ventricular Rate:  71 PR Interval:    QRS Duration: 84 QT Interval:  398 QTC Calculation: 433 R Axis:   59 Text Interpretation:  Sinus rhythm Since last EKG, no significant changes  Confirmed by Ilya Ess MD, Kambrey Hagger [phone]) on 04/21/2016 11:45:58 PM       Radiology Dg Chest 2 View  Result Date: 04/22/2016 CLINICAL DATA:  Intermittent chest pain for 8 hours. EXAM: CHEST  2 VIEW COMPARISON:  Radiographs and CT April 2011 FINDINGS: Normal heart size. There is tortuosity of the thoracic aorta, unchanged. No pulmonary edema. No focal airspace disease, pleural fluid or pneumothorax. No acute osseous abnormalities. Postsurgical change in the lumbar spine is partially included. Mild scoliosis. IMPRESSION: No active cardiopulmonary disease. Electronically Signed   By: Jeb Levering M.D.   On: 04/22/2016 00:47    Procedures Procedures (including critical care time)  Medications Ordered in ED Medications  metoCLOPramide (REGLAN) injection 10 mg (10 mg Intravenous Given 04/22/16 0037)     Initial Impression /  Assessment and Plan / ED Course  I have reviewed the triage vital signs and the nursing notes.  Pertinent labs & imaging results that were available during my care of the patient were reviewed by me and considered in my medical decision making (see chart for details).  Clinical Course    Presenting with intermittent chest pain since 3 PM. she is nontoxic in no acute distress. Pain now fully resolved after receiving nitroglycerin and aspirin by EMS prior to arrival. Chest pain is not reproducible on exam. EKG is nonischemic and troponin 1 is negative. Heart score 4, and increased risk of ACS and will plan to admit for serial troponins and cardiac rule out. Chest x-ray visualized shows no acute cardiopulmonary processes. Pain does not seem consistent with that of dissection or PE at this time.  Final Clinical Impressions(s) / ED Diagnoses   Final diagnoses:  Nonspecific chest pain    New Prescriptions New Prescriptions   No medications on file   I personally performed the services described in this documentation, which was scribed in my presence. The recorded information has been reviewed and is accurate.    Forde Dandy, MD 04/22/16 [phone]

## 2016-04-22 ENCOUNTER — Observation Stay (HOSPITAL_BASED_OUTPATIENT_CLINIC_OR_DEPARTMENT_OTHER): Payer: Medicare Other

## 2016-04-22 ENCOUNTER — Encounter (HOSPITAL_COMMUNITY): Payer: Medicare Other

## 2016-04-22 ENCOUNTER — Encounter (HOSPITAL_COMMUNITY): Payer: Self-pay | Admitting: Internal Medicine

## 2016-04-22 ENCOUNTER — Emergency Department (HOSPITAL_COMMUNITY): Payer: Medicare Other

## 2016-04-22 DIAGNOSIS — E7800 Pure hypercholesterolemia, unspecified: Secondary | ICD-10-CM

## 2016-04-22 DIAGNOSIS — R079 Chest pain, unspecified: Secondary | ICD-10-CM | POA: Diagnosis not present

## 2016-04-22 DIAGNOSIS — R011 Cardiac murmur, unspecified: Secondary | ICD-10-CM | POA: Diagnosis not present

## 2016-04-22 DIAGNOSIS — I10 Essential (primary) hypertension: Secondary | ICD-10-CM | POA: Diagnosis not present

## 2016-04-22 DIAGNOSIS — E119 Type 2 diabetes mellitus without complications: Secondary | ICD-10-CM | POA: Diagnosis not present

## 2016-04-22 DIAGNOSIS — I200 Unstable angina: Secondary | ICD-10-CM | POA: Diagnosis not present

## 2016-04-22 DIAGNOSIS — E785 Hyperlipidemia, unspecified: Secondary | ICD-10-CM

## 2016-04-22 LAB — CBC WITH DIFFERENTIAL/PLATELET
BASOS ABS: 0 10*3/uL (ref 0.0–0.1)
Basophils Relative: 0 %
Eosinophils Absolute: 0.1 10*3/uL (ref 0.0–0.7)
Eosinophils Relative: 1 %
HEMATOCRIT: 34.8 % — AB (ref 36.0–46.0)
HEMOGLOBIN: 12 g/dL (ref 12.0–15.0)
LYMPHS PCT: 36 %
Lymphs Abs: 2.4 10*3/uL (ref 0.7–4.0)
MCH: 29.9 pg (ref 26.0–34.0)
MCHC: 34.5 g/dL (ref 30.0–36.0)
MCV: 86.6 fL (ref 78.0–100.0)
MONO ABS: 0.5 10*3/uL (ref 0.1–1.0)
MONOS PCT: 8 %
NEUTROS ABS: 3.5 10*3/uL (ref 1.7–7.7)
Neutrophils Relative %: 55 %
Platelets: 221 10*3/uL (ref 150–400)
RBC: 4.02 MIL/uL (ref 3.87–5.11)
RDW: 13 % (ref 11.5–15.5)
WBC: 6.5 10*3/uL (ref 4.0–10.5)

## 2016-04-22 LAB — NM MYOCAR MULTI W/SPECT W/WALL MOTION / EF
CSEPED: 5 min
CSEPEDS: 1 s
CSEPEW: 1 METS
Peak HR: 92 {beats}/min
Rest HR: 67 {beats}/min

## 2016-04-22 LAB — TROPONIN I

## 2016-04-22 LAB — CBC
HCT: 34 % — ABNORMAL LOW (ref 36.0–46.0)
HEMOGLOBIN: 11.5 g/dL — AB (ref 12.0–15.0)
MCH: 30 pg (ref 26.0–34.0)
MCHC: 33.8 g/dL (ref 30.0–36.0)
MCV: 88.8 fL (ref 78.0–100.0)
PLATELETS: 195 10*3/uL (ref 150–400)
RBC: 3.83 MIL/uL — ABNORMAL LOW (ref 3.87–5.11)
RDW: 13.4 % (ref 11.5–15.5)
WBC: 6.2 10*3/uL (ref 4.0–10.5)

## 2016-04-22 LAB — GLUCOSE, CAPILLARY
GLUCOSE-CAPILLARY: 182 mg/dL — AB (ref 65–99)
Glucose-Capillary: 130 mg/dL — ABNORMAL HIGH (ref 65–99)
Glucose-Capillary: 138 mg/dL — ABNORMAL HIGH (ref 65–99)
Glucose-Capillary: 182 mg/dL — ABNORMAL HIGH (ref 65–99)
Glucose-Capillary: 206 mg/dL — ABNORMAL HIGH (ref 65–99)

## 2016-04-22 LAB — CREATININE, SERUM
CREATININE: 0.7 mg/dL (ref 0.44–1.00)
GFR calc Af Amer: >60 mL/min (ref >60)

## 2016-04-22 LAB — I-STAT TROPONIN, ED: Troponin i, poc: 0.01 ng/mL (ref 0.00–0.08)

## 2016-04-22 LAB — BASIC METABOLIC PANEL
ANION GAP: 10 (ref 5–15)
BUN: 13 mg/dL (ref 6–20)
CALCIUM: 10.7 mg/dL — AB (ref 8.9–10.3)
CO2: 29 mmol/L (ref 22–32)
Chloride: 100 mmol/L — ABNORMAL LOW (ref 101–111)
Creatinine, Ser: 0.73 mg/dL (ref 0.44–1.00)
GFR calc Af Amer: >60 mL/min (ref >60)
GFR calc non Af Amer: >60 mL/min (ref >60)
GLUCOSE: 173 mg/dL — AB (ref 65–99)
Potassium: 3.7 mmol/L (ref 3.5–5.1)
Sodium: 139 mmol/L (ref 135–145)

## 2016-04-22 MED ORDER — AMLODIPINE BESYLATE 5 MG PO TABS
5.0000 mg | ORAL_TABLET | Freq: Every day | ORAL | Status: DC
  Administered 2016-04-22 – 2016-04-23 (×2): 5 mg via ORAL
  Filled 2016-04-22 (×2): qty 1

## 2016-04-22 MED ORDER — LOSARTAN POTASSIUM 50 MG PO TABS
100.0000 mg | ORAL_TABLET | Freq: Every day | ORAL | Status: DC
  Administered 2016-04-22 – 2016-04-23 (×2): 100 mg via ORAL
  Filled 2016-04-22 (×2): qty 2

## 2016-04-22 MED ORDER — INSULIN ASPART 100 UNIT/ML ~~LOC~~ SOLN: 0.0000 [IU] | SUBCUTANEOUS | Status: DC

## 2016-04-22 MED ORDER — ACETAMINOPHEN 325 MG PO TABS: 650.0000 mg | ORAL_TABLET | ORAL | Status: DC | PRN

## 2016-04-22 MED ORDER — CYCLOBENZAPRINE HCL 10 MG PO TABS
10.0000 mg | ORAL_TABLET | Freq: Once | ORAL | Status: AC
  Administered 2016-04-23: 10 mg via ORAL
  Filled 2016-04-22: qty 1

## 2016-04-22 MED ORDER — TRAMADOL HCL 50 MG PO TABS
50.0000 mg | ORAL_TABLET | Freq: Once | ORAL | Status: AC
  Administered 2016-04-22: 50 mg via ORAL

## 2016-04-22 MED ORDER — LATANOPROST 0.005 % OP SOLN
1.0000 [drp] | Freq: Every day | OPHTHALMIC | Status: DC
  Administered 2016-04-22: 1 [drp] via OPHTHALMIC
  Filled 2016-04-22: qty 2.5

## 2016-04-22 MED ORDER — NITROGLYCERIN 0.4 MG SL SUBL
0.4000 mg | SUBLINGUAL_TABLET | SUBLINGUAL | Status: DC | PRN
  Administered 2016-04-22: 0.4 mg via SUBLINGUAL
  Filled 2016-04-22: qty 1

## 2016-04-22 MED ORDER — HYDROCHLOROTHIAZIDE 25 MG PO TABS: 25.0000 mg | ORAL_TABLET | Freq: Every day | ORAL | Status: DC

## 2016-04-22 MED ORDER — ASPIRIN EC 81 MG PO TBEC
81.0000 mg | DELAYED_RELEASE_TABLET | Freq: Every day | ORAL | Status: DC
  Administered 2016-04-22 – 2016-04-23 (×2): 81 mg via ORAL
  Filled 2016-04-22 (×2): qty 1

## 2016-04-22 MED ORDER — ENOXAPARIN SODIUM 40 MG/0.4ML ~~LOC~~ SOLN
40.0000 mg | Freq: Every day | SUBCUTANEOUS | Status: DC
  Administered 2016-04-22: 40 mg via SUBCUTANEOUS
  Filled 2016-04-22 (×2): qty 0.4

## 2016-04-22 MED ORDER — ONDANSETRON HCL 4 MG/2ML IJ SOLN: 4.0000 mg | Freq: Four times a day (QID) | INTRAMUSCULAR | Status: DC | PRN

## 2016-04-22 MED ORDER — TRAMADOL HCL 50 MG PO TABS
50.0000 mg | ORAL_TABLET | Freq: Two times a day (BID) | ORAL | Status: DC | PRN
  Administered 2016-04-22: 50 mg via ORAL
  Filled 2016-04-22 (×2): qty 1

## 2016-04-22 MED ORDER — REGADENOSON 0.4 MG/5ML IV SOLN
INTRAVENOUS | Status: AC
  Filled 2016-04-22: qty 5

## 2016-04-22 MED ORDER — LEVOTHYROXINE SODIUM 88 MCG PO TABS
88.0000 ug | ORAL_TABLET | Freq: Every day | ORAL | Status: DC
  Administered 2016-04-22 – 2016-04-23 (×2): 88 ug via ORAL
  Filled 2016-04-22 (×2): qty 1

## 2016-04-22 MED ORDER — POTASSIUM CHLORIDE CRYS ER 20 MEQ PO TBCR: 20.0000 meq | EXTENDED_RELEASE_TABLET | Freq: Every day | ORAL | Status: DC

## 2016-04-22 MED ORDER — TECHNETIUM TC 99M TETROFOSMIN IV KIT
10.0000 | PACK | Freq: Once | INTRAVENOUS | Status: AC | PRN
  Administered 2016-04-22: 10 via INTRAVENOUS

## 2016-04-22 MED ORDER — LORATADINE 10 MG PO TABS
10.0000 mg | ORAL_TABLET | Freq: Every day | ORAL | Status: DC
  Administered 2016-04-22 – 2016-04-23 (×2): 10 mg via ORAL
  Filled 2016-04-22 (×2): qty 1

## 2016-04-22 MED ORDER — ZOLPIDEM TARTRATE 5 MG PO TABS: 5.0000 mg | ORAL_TABLET | Freq: Every evening | ORAL | Status: DC | PRN

## 2016-04-22 MED ORDER — OMEGA-3-ACID ETHYL ESTERS 1 G PO CAPS
1000.0000 mg | ORAL_CAPSULE | Freq: Two times a day (BID) | ORAL | Status: DC
  Administered 2016-04-22 – 2016-04-23 (×3): 1000 mg via ORAL
  Filled 2016-04-22 (×3): qty 1

## 2016-04-22 MED ORDER — METOCLOPRAMIDE HCL 5 MG/ML IJ SOLN
10.0000 mg | Freq: Once | INTRAMUSCULAR | Status: AC
  Administered 2016-04-22: 10 mg via INTRAVENOUS
  Filled 2016-04-22: qty 2

## 2016-04-22 MED ORDER — FAMOTIDINE 20 MG PO TABS
20.0000 mg | ORAL_TABLET | Freq: Two times a day (BID) | ORAL | Status: DC
  Administered 2016-04-22 – 2016-04-23 (×3): 20 mg via ORAL
  Filled 2016-04-22 (×3): qty 1

## 2016-04-22 MED ORDER — TECHNETIUM TC 99M TETROFOSMIN IV KIT
30.0000 | PACK | Freq: Once | INTRAVENOUS | Status: AC | PRN
  Administered 2016-04-22: 30 via INTRAVENOUS

## 2016-04-22 MED ORDER — ASPIRIN EC 325 MG PO TBEC: 325.0000 mg | DELAYED_RELEASE_TABLET | Freq: Every day | ORAL | Status: DC

## 2016-04-22 MED ORDER — REGADENOSON 0.4 MG/5ML IV SOLN
0.4000 mg | Freq: Once | INTRAVENOUS | Status: AC
  Administered 2016-04-22: 0.4 mg via INTRAVENOUS
  Filled 2016-04-22: qty 5

## 2016-04-22 NOTE — ED Notes (Signed)
Pt c/o mild chest pain. EDP aware.

## 2016-04-22 NOTE — Progress Notes (Addendum)
Stress test negative:  IMPRESSION: 1. No reversible ischemia or infarction.  2. Normal left ventricular wall motion.  3. Left ventricular ejection fraction 75%  4. Non invasive risk stratification*: Low risk  No further ischemic evaluation. Pending echo.

## 2016-04-22 NOTE — Consult Note (Signed)
Cardiology Consultation Note    Patient ID: Joy Ward, MRN: [id number], DOB/AGE: 10/08/1940 75 y.o. Admit date: 04/21/2016   Date of Consult: 04/22/2016 Primary Physician: Mack Hook, MD Primary Cardiologist: Remotely seen by Dr. Ron Parker in consult 2011  Chief Complaint: chest pain Reason for Consultation: chest pain Requesting MD: Dr. Hal Hope  HPI: Joy Ward is a 75 y.o. female with history of HTN, DM, hypothyroidism (prior goiter removal), ?stroke/ministroke, hyperlipidemia (muscle aches with statins per PCP note), arthritis who presented to [facility] with chest pain and left arm pain. She was remotely seen by Dr. Ron Parker in 2011 for chest discomfort with 2D echo (buried under notes) showing EF 60-65%, grade 1 dd, calcified mitral annulus, septal lipomatous hypertrophy. She does recall feeling episodically sick on her stomach over the last year, relieved with Mylanta. This episode yesterday was different.  Around 8pm while sitting down resting she began to feel nauseated. She took Mylanta but this did not help this time - she then developed sensation of left sided chest pain/pressure associated with left arm discomfort. She felt as though she was suddenly aware of her heart, like it felt "thick." She denies any SOB, palpitations, clamminess. She says she has some memory difficulties and has a hard time remembering if it was constant or intermittent. She called EMS and was given 324mg  ASA and 1 SL NTG. She says she had eventual relief of chest pain but not right away. She does not know if it was worse with inspiration, palpation or exertion as she did not try any of these measures while it was occurring. She has remained pain free overnight. She does not exercise or physically exert herself regularly. Denies any h/o CP/SOB with her ADLs recently. Workup notable for trop neg x 2, calcium 10.7, glucose 173, CXR NAD. EKG with nonspecific ST-T changes, no prior in Epic to compare  to.    Past Medical History:  Diagnosis Date  . Allergy   . Arthritis   . Diabetes mellitus without complication ([place])   . Former tobacco use   . Hypertension   . Hypothyroidism    a. prior thyroid removal.      Surgical History:  Past Surgical History:  Procedure Laterality Date  . ABDOMINAL HYSTERECTOMY  1994   TAH, not clear if unilateral oophorectomy as well.  Marland Kitchen [place]  . CHOLECYSTECTOMY  1973   open  . DECOMPRESSIVE LUMBAR LAMINECTOMY LEVEL 4  2005   In [place]  . THYROIDECTOMY  2012   multinodular goiter     Home Meds: Prior to Admission medications   Medication Sig Start Date End Date Taking? Authorizing Provider  amLODipine (NORVASC) 5 MG tablet Take 5 mg by mouth daily.   Yes Historical Provider, MD  diclofenac sodium (VOLTAREN) 1 % GEL Apply 4 g topically 2 (two) times daily.    Yes Historical Provider, MD  glimepiride (AMARYL) 1 MG tablet Take 2 mg by mouth daily with breakfast.    Yes Historical Provider, MD  hydrochlorothiazide (HYDRODIURIL) 25 MG tablet Take 25 mg by mouth daily.   Yes Historical Provider, MD  HYDROcodone-acetaminophen (NORCO) 10-325 MG tablet Take 1 tablet by mouth every 6 (six) hours as needed.   Yes Historical Provider, MD  levocetirizine (XYZAL) 5 MG tablet Take 5 mg by mouth daily.    Yes Historical Provider, MD  levothyroxine (SYNTHROID, LEVOTHROID) 88 MCG tablet Take 88 mcg by mouth daily before breakfast.   Yes Historical Provider,  MD  losartan (COZAAR) 100 MG tablet Take 100 mg by mouth daily.   Yes Historical Provider, MD  LUMIGAN 0.01 % SOLN Place 1 drop into the left eye at bedtime. 04/14/16  Yes Historical Provider, MD  meloxicam (MOBIC) 7.5 MG tablet Take 7.5 mg by mouth daily. 03/27/16  Yes Historical Provider, MD  Multiple Vitamin (MULTIVITAMINS PO) Take 1 tablet by mouth daily.   Yes Historical Provider, MD  Nutritional Supplements (ESTROVEN PO) Take 1 tablet by mouth daily.   Yes Historical  Provider, MD  Omega-3 Fatty Acids (FISH OIL) 1000 MG CAPS Take 1 capsule by mouth 2 (two) times daily.   Yes Historical Provider, MD  potassium chloride SA (K-DUR,KLOR-CON) 20 MEQ tablet Take 20 mEq by mouth daily.   Yes Historical Provider, MD  ranitidine (ZANTAC) 150 MG tablet Take 150 mg by mouth every morning.    Yes Historical Provider, MD  traMADol (ULTRAM) 50 MG tablet Take 50 mg by mouth every 12 (twelve) hours as needed.   Yes Historical Provider, MD  zolpidem (AMBIEN) 5 MG tablet Take 5 mg by mouth at bedtime as needed for sleep.   Yes Historical Provider, MD    Inpatient Medications:  . amLODipine  5 mg Oral Daily  . aspirin EC  325 mg Oral Daily  . enoxaparin (LOVENOX) injection  40 mg Subcutaneous Daily  . famotidine  20 mg Oral BID  . insulin aspart  0-9 Units Subcutaneous Q4H  . latanoprost  1 drop Left Eye QHS  . levothyroxine  88 mcg Oral QAC breakfast  . losartan  100 mg Oral Daily  . omega-3 acid ethyl esters  1,000 mg Oral BID     Allergies:  Allergies  Allergen Reactions  . Clindamycin Hcl Other (See Comments)    REACTION: C. difficile colitis  . Metronidazole Hives    REACTION: hives  . Trazodone And Nefazodone Other (See Comments)    Sinus pains  . Ace Inhibitors Cough    REACTION: cough  . Adhesive [Tape] Itching and Rash    Blistering of skin   . Levothyroxine Sodium Other (See Comments)    REACTION: Intolerance 3/08. Patient was placed on this to suppress the thyroid nodule with previously normal TSH.    Social History   Social History  . Marital status: Single    Spouse name: N/A  . Number of children: 7  . Years of education: 66   Occupational History  . pastor--travels a lot.    Social History Main Topics  . Smoking status: Former Research scientist (life sciences)  . Smokeless tobacco: Never Used     Comment: No smoking since 75 yo - smoked 10 yrs  . Alcohol use No  . Drug use: No  . Sexual activity: Not on file   Other Topics Concern  . Not on file    Social History Narrative   Lives alone in [place].   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     Family History  Problem Relation Age of Onset  . Heart disease Mother     unclear details "atherosclerosis"  . Diabetes Mother   . Alcohol abuse Sister   . Diabetes Brother   . Diabetes Brother   . Drug abuse Brother   . Diabetes Daughter   . Heart disease Son     CHF  . Alcohol abuse Son      Review of Systems: No weight changes, bleeding,  syncope, palpitations. All other systems reviewed and are otherwise negative except as noted above.  Labs:  Recent Labs  04/22/16 0448  TROPONINI <0.03   Lab Results  Component Value Date   WBC 6.2 04/22/2016   HGB 11.5 (L) 04/22/2016   HCT 34.0 (L) 04/22/2016   MCV 88.8 04/22/2016   PLT 195 04/22/2016    Recent Labs Lab 04/22/16 0033 04/22/16 0448  NA 139  --   K 3.7  --   CL 100*  --   CO2 29  --   BUN 13  --   CREATININE 0.73 0.70  CALCIUM 10.7*  --   GLUCOSE 173*  --     No results found for: DDIMER  Radiology/Studies:  Dg Chest 2 View  Result Date: 04/22/2016 CLINICAL DATA:  Intermittent chest pain for 8 hours. EXAM: CHEST  2 VIEW COMPARISON:  Radiographs and CT April 2011 FINDINGS: Normal heart size. There is tortuosity of the thoracic aorta, unchanged. No pulmonary edema. No focal airspace disease, pleural fluid or pneumothorax. No acute osseous abnormalities. Postsurgical change in the lumbar spine is partially included. Mild scoliosis. IMPRESSION: No active cardiopulmonary disease. Electronically Signed   By: Jeb Levering M.D.   On: 04/22/2016 00:47    Wt Readings from Last 3 Encounters:  04/22/16 188 lb 14.4 oz (85.7 kg)  03/04/16 187 lb (84.8 kg)  01/24/10 167 lb 11.2 oz (76.1 kg)    EKG: NSR with nonspecific St-T changes - midl ST sagging/TW flattening inferiorly as well as V5-V6  Physical Exam: Blood pressure 129/62, pulse 89, temperature 98.1 F  (36.7 C), temperature source Oral, resp. rate 20, height 5\' 4"  (1.626 m), weight 188 lb 14.4 oz (85.7 kg), SpO2 98 %. Body mass index is 32.42 kg/m. General: Well developed, well nourished, in no acute distress. Head: Normocephalic, atraumatic, sclera non-icteric, no xanthomas, nares are without discharge.  Neck: Negative for carotid bruits. JVD not elevated. Lungs: Clear bilaterally to auscultation without wheezes, rales, or rhonchi. Breathing is unlabored. Heart: RRR with S1 S2. Soft SEM RUSB. No rubs or gallops appreciated. Abdomen: Soft, non-tender, non-distended with normoactive bowel sounds. No hepatomegaly. No rebound/guarding. No obvious abdominal masses. Msk:  Strength and tone appear normal for age. Extremities: No clubbing or cyanosis. No edema.  Distal pedal pulses are 2+ and equal bilaterally. LE are tender, patient states this has been present for quite a while. Neuro: Alert and oriented X 3. No facial asymmetry. No focal deficit. Moves all extremities spontaneously. Psych:  Responds to questions appropriately with a normal affect.     Assessment and Plan  25F with HTN, DM, hypothyroidism (prior goiter removal), hyperlipidemia, arthritis who presented to [facility] with chest pain and left arm pain, nonspecific EKG changes and negative troponins.  1. Chest pain - mixed typical/atypical features, inferolateral TW ST-T changes on EKG, multiple risk factors for CAD. Will review further workup with MD. Continue aspirin, amlodipine. Check lipids in AM if still admitted and consider addition of statin. Internal medicine plans to check LE duplex due to lower extremity tenderness.  2. HTN - controlled.  3. Systolic murmur - prior echo with calcified mitral annulus, but murmur heard at RUSB. 2D echo pending.  4. Hyperlipidemia - check lipids in AM. H/o muscle aches with statins per PCP notes.  5. Hypercalcemia - per internal medicine. May be related to HCTZ but she does have  h/o thyroid surgery thus disruption to parathyroid parenchyma is also possible.  6. Diabetes mellitus -  per IM.  Signed, Charlie Pitter PA-C 04/22/2016, 7:50 AM Pager: [phone]  Agree with findings by Melina Copa PA-C  Atyp CP. + CRF. Neg stress test in past. Enz neg. EKG with NSSTTWC. Exam benign. Labs OK. Plan Lexiscan MV today. If neg can be DCd home.  Lorretta Harp, M.D., [place], [facility], [place]verta [place] [address] [place], [place]  [id number]  [phone] 04/22/2016 9:43 AM

## 2016-04-22 NOTE — Progress Notes (Signed)
Patient seen and examined. She is chest pain free. First 2 troponin negative. EKG with prolong PR.  Cardiology consulted, awaiting evaluation.  Patient complaint of right leg pain and edema. Will check doppler.  Hold diuretics, in case patient has Cardiac cath.  Care discussed with son.  Joy Mayfield, Md.

## 2016-04-22 NOTE — Progress Notes (Signed)
Patient presented for Lexiscan. Tolerated procedure well. Pending final stress imaging result.  

## 2016-04-22 NOTE — Care Management Obs Status (Signed)
[facility] NOTIFICATION   Patient Details  Name: Joy Ward MRN: [id number] Date of Birth: Jun 21, 1940   Medicare Observation Status Notification Given:  Yes    Dawayne Patricia, RN 04/22/2016, 3:40 PM

## 2016-04-22 NOTE — Plan of Care (Signed)
BS 209 @ bedtime. Pt refuses insulin and requesting glimepiride instead. Raliegh Ip Schorr made aware. No new orders at this time. The rational for taking insulin was explained to the pt.

## 2016-04-22 NOTE — H&P (Signed)
History and Physical    Joy Ward [id number] DOB: [birth year] DOA: 04/21/2016  PCP: Mack Hook, MD  Patient coming from: Home.  Chief Complaint: Chest pain.  HPI: Joy Ward is a 75 y.o. female with history of hypertension, diabetes mellitus type 2, hypothyroidism and arthritis started to experience chest pain last night while watching TV. Pain was retrosternal pressure-like radiating to her left arm. Patient took aspirin but pain did not relieve. EMS was called and on the way patient was given sublingual nitroglycerin. In the ER patient was given another dose of sublingual nitroglycerin following which chest pain resolved. Chest x-ray and EKG cardiac markers were unremarkable. Given her risk factors patient has been admitted for further ACS rule out. Denies any productive cough fever chills shortness of breath.  ED Course: Sublingual nitroglycerin was given. Chest x-ray EKG and cardiac markers were unremarkable.  Review of Systems: As per HPI, rest all negative.   Past Medical History:  Diagnosis Date  . Allergy   . Diabetes mellitus without complication ([place])   . Hypertension   . Thyroid disease     Past Surgical History:  Procedure Laterality Date  . ABDOMINAL HYSTERECTOMY  1994   TAH, not clear if unilateral oophorectomy as well.  [facility] [place]  . CHOLECYSTECTOMY  1973   open  . DECOMPRESSIVE LUMBAR LAMINECTOMY LEVEL 4  2005   In [place]  . THYROIDECTOMY  2012   multinodular goiter     reports that she has quit smoking. She has never used smokeless tobacco. She reports that she does not drink alcohol or use drugs.  Allergies  Allergen Reactions  . Clindamycin Hcl Other (See Comments)    REACTION: C. difficile colitis  . Metronidazole Hives    REACTION: hives  . Trazodone And Nefazodone Other (See Comments)    Sinus pains  . Ace Inhibitors Cough    REACTION: cough  . Adhesive [Tape] Itching and Rash    Blistering of  skin   . Levothyroxine Sodium Other (See Comments)    REACTION: Intolerance 3/08. Patient was placed on this to suppress the thyroid nodule with previously normal TSH.    Family History  Problem Relation Age of Onset  . Heart disease Mother   . Diabetes Mother   . Alcohol abuse Sister   . Diabetes Brother   . Diabetes Brother   . Drug abuse Brother   . Diabetes Daughter   . Heart disease Son     CHF  . Alcohol abuse Son     Prior to Admission medications   Medication Sig Start Date End Date Taking? Authorizing Provider  amLODipine (NORVASC) 5 MG tablet Take 5 mg by mouth daily.   Yes Historical Provider, MD  diclofenac sodium (VOLTAREN) 1 % GEL Apply 4 g topically 2 (two) times daily.    Yes Historical Provider, MD  glimepiride (AMARYL) 1 MG tablet Take 2 mg by mouth daily with breakfast.    Yes Historical Provider, MD  hydrochlorothiazide (HYDRODIURIL) 25 MG tablet Take 25 mg by mouth daily.   Yes Historical Provider, MD  HYDROcodone-acetaminophen (NORCO) 10-325 MG tablet Take 1 tablet by mouth every 6 (six) hours as needed.   Yes Historical Provider, MD  levocetirizine (XYZAL) 5 MG tablet Take 5 mg by mouth daily.    Yes Historical Provider, MD  levothyroxine (SYNTHROID, LEVOTHROID) 88 MCG tablet Take 88 mcg by mouth daily before breakfast.   Yes Historical Provider, MD  losartan (COZAAR) 100  MG tablet Take 100 mg by mouth daily.   Yes Historical Provider, MD  LUMIGAN 0.01 % SOLN Place 1 drop into the left eye at bedtime. 04/14/16  Yes Historical Provider, MD  meloxicam (MOBIC) 7.5 MG tablet Take 7.5 mg by mouth daily. 03/27/16  Yes Historical Provider, MD  Multiple Vitamin (MULTIVITAMINS PO) Take 1 tablet by mouth daily.   Yes Historical Provider, MD  Nutritional Supplements (ESTROVEN PO) Take 1 tablet by mouth daily.   Yes Historical Provider, MD  Omega-3 Fatty Acids (FISH OIL) 1000 MG CAPS Take 1 capsule by mouth 2 (two) times daily.   Yes Historical Provider, MD  potassium  chloride SA (K-DUR,KLOR-CON) 20 MEQ tablet Take 20 mEq by mouth daily.   Yes Historical Provider, MD  ranitidine (ZANTAC) 150 MG tablet Take 150 mg by mouth every morning.    Yes Historical Provider, MD  traMADol (ULTRAM) 50 MG tablet Take 50 mg by mouth every 12 (twelve) hours as needed.   Yes Historical Provider, MD  zolpidem (AMBIEN) 5 MG tablet Take 5 mg by mouth at bedtime as needed for sleep.   Yes Historical Provider, MD    Physical Exam: Vitals:   04/22/16 0115 04/22/16 0145 04/22/16 0215 04/22/16 0230  BP: 123/71 121/69 127/72 129/62  Pulse: 77 73 77 89  Resp: 17 17 18 20   Temp:    98.1 F (36.7 C)  TempSrc:    Oral  SpO2: 100% 99% 95% 98%  Weight:    85.7 kg (188 lb 14.4 oz)  Height:    5\' 4"  (1.626 m)      Constitutional: Moderately built and nourished. Vitals:   04/22/16 0115 04/22/16 0145 04/22/16 0215 04/22/16 0230  BP: 123/71 121/69 127/72 129/62  Pulse: 77 73 77 89  Resp: 17 17 18 20   Temp:    98.1 F (36.7 C)  TempSrc:    Oral  SpO2: 100% 99% 95% 98%  Weight:    85.7 kg (188 lb 14.4 oz)  Height:    5\' 4"  (1.626 m)   Eyes: Anicteric no pallor. ENMT: No discharge from the ears eyes nose or mouth. Neck: No mass felt. No JVD appreciated. Respiratory: No rhonchi or crepitations. Cardiovascular: S1-S2 heard no murmurs appreciated. Abdomen: Soft nontender bowel sounds present. No guarding or rigidity. Musculoskeletal: No edema. No joint effusion. Skin: No rash. Skin appears warm. Neurologic: Alert awake oriented to time place and person. Moves all extremities. Psychiatric: Appears normal. Normal affect.   Labs on Admission: I have personally reviewed following labs and imaging studies  CBC:  Recent Labs Lab 04/22/16 0033  WBC 6.5  NEUTROABS 3.5  HGB 12.0  HCT 34.8*  MCV 86.6  PLT [id number]   Basic Metabolic Panel:  Recent Labs Lab 04/22/16 0033  NA 139  K 3.7  CL 100*  CO2 29  GLUCOSE 173*  BUN 13  CREATININE 0.73  CALCIUM 10.7*    GFR: Estimated Creatinine Clearance: 65.4 mL/min (by C-G formula based on SCr of 0.73 mg/dL). Liver Function Tests: No results for input(s): AST, ALT, ALKPHOS, BILITOT, PROT, ALBUMIN in the last 168 hours. No results for input(s): LIPASE, AMYLASE in the last 168 hours. No results for input(s): AMMONIA in the last 168 hours. Coagulation Profile: No results for input(s): INR, PROTIME in the last 168 hours. Cardiac Enzymes: No results for input(s): CKTOTAL, CKMB, CKMBINDEX, TROPONINI in the last 168 hours. BNP (last 3 results) No results for input(s): PROBNP in the last 8760 hours. HbA1C:  No results for input(s): HGBA1C in the last 72 hours. CBG: No results for input(s): GLUCAP in the last 168 hours. Lipid Profile: No results for input(s): CHOL, HDL, LDLCALC, TRIG, CHOLHDL, LDLDIRECT in the last 72 hours. Thyroid Function Tests: No results for input(s): TSH, T4TOTAL, FREET4, T3FREE, THYROIDAB in the last 72 hours. Anemia Panel: No results for input(s): VITAMINB12, FOLATE, FERRITIN, TIBC, IRON, RETICCTPCT in the last 72 hours. Urine analysis:    Component Value Date/Time   COLORURINE YELLOW 08/21/2009 1913   APPEARANCEUR HAZY (A) 08/21/2009 1913   LABSPEC 1.017 08/21/2009 1913   PHURINE 8.5 (H) 08/21/2009 1913   GLUCOSEU NEGATIVE 08/21/2009 1913   HGBUR NEGATIVE 08/21/2009 1913   HGBUR negative 02/14/2009 0938   BILIRUBINUR NEGATIVE 08/21/2009 1913   KETONESUR NEGATIVE 08/21/2009 1913   PROTEINUR NEGATIVE 08/21/2009 1913   UROBILINOGEN 0.2 08/21/2009 1913   NITRITE NEGATIVE 08/21/2009 1913   LEUKOCYTESUR  08/21/2009 1913    NEGATIVE MICROSCOPIC NOT DONE ON URINES WITH NEGATIVE PROTEIN, BLOOD, LEUKOCYTES, NITRITE, OR GLUCOSE <1000 mg/dL.   Sepsis Labs: @LABRCNTIP (procalcitonin:4,lacticidven:4) )No results found for this or any previous visit (from the past 240 hour(s)).   Radiological Exams on Admission: Dg Chest 2 View  Result Date: 04/22/2016 CLINICAL DATA:   Intermittent chest pain for 8 hours. EXAM: CHEST  2 VIEW COMPARISON:  Radiographs and CT April 2011 FINDINGS: Normal heart size. There is tortuosity of the thoracic aorta, unchanged. No pulmonary edema. No focal airspace disease, pleural fluid or pneumothorax. No acute osseous abnormalities. Postsurgical change in the lumbar spine is partially included. Mild scoliosis. IMPRESSION: No active cardiopulmonary disease. Electronically Signed   By: Jeb Levering M.D.   On: 04/22/2016 00:47    EKG: Independently reviewed. Normal sinus rhythm.  Assessment/Plan Principal Problem:   Chest pain Active Problems:   Diabetes mellitus type 2, controlled, without complications (HCC)   Essential hypertension    1. Chest pain - presently chest pain-free. Chest pain improved with sublingual nitroglycerin. Given the risk factors including diabetes mellitus hypertension age and family history we will cycle cardiac markers for rule out ACS. I have requested cardiology consult. Patient will be kept nothing by mouth in a.m. in anticipation of cardiac procedures. Check 2-D echo. 2. Hypertension - continue amlodipine and Cozaar and hydrochlorothiazide. 3. Hypothyroidism on Synthroid. 4. Diabetes mellitus type 2 - hold oral hypoglycemic. On sliding scale coverage. 5. History of arthritis.   DVT prophylaxis: Lovenox. Code Status: Full code.  Family Communication: Discussed with patient's son.  Disposition Plan: Home.  Consults called: Cardiology consult requested.  Admission status: Observation.    Rise Patience MD [facility] Pager [phone].  If 7PM-7AM, please contact night-coverage [email or web address] Password [facility]  04/22/2016, 4:24 AM

## 2016-04-22 NOTE — ED Notes (Signed)
Delay in lab draw,  Pt not in room 

## 2016-04-23 ENCOUNTER — Observation Stay (HOSPITAL_BASED_OUTPATIENT_CLINIC_OR_DEPARTMENT_OTHER): Payer: Medicare Other

## 2016-04-23 DIAGNOSIS — I200 Unstable angina: Secondary | ICD-10-CM | POA: Diagnosis not present

## 2016-04-23 DIAGNOSIS — M79609 Pain in unspecified limb: Secondary | ICD-10-CM | POA: Diagnosis not present

## 2016-04-23 DIAGNOSIS — R079 Chest pain, unspecified: Secondary | ICD-10-CM | POA: Diagnosis not present

## 2016-04-23 LAB — GLUCOSE, CAPILLARY
GLUCOSE-CAPILLARY: 151 mg/dL — AB (ref 65–99)
GLUCOSE-CAPILLARY: 155 mg/dL — AB (ref 65–99)
GLUCOSE-CAPILLARY: 197 mg/dL — AB (ref 65–99)
Glucose-Capillary: 148 mg/dL — ABNORMAL HIGH (ref 65–99)

## 2016-04-23 LAB — ECHOCARDIOGRAM COMPLETE
Height: 64 in
WEIGHTICAEL: 3022.4 [oz_av]

## 2016-04-23 LAB — LIPID PANEL
CHOL/HDL RATIO: 3.1 ratio
Cholesterol: 246 mg/dL — ABNORMAL HIGH (ref 0–200)
HDL: 80 mg/dL (ref >40)
LDL Cholesterol: 141 mg/dL — ABNORMAL HIGH (ref 0–99)
Triglycerides: 124 mg/dL (ref <150)
VLDL: 25 mg/dL (ref 0–40)

## 2016-04-23 MED ORDER — SIMVASTATIN 10 MG PO TABS: 10.0000 mg | ORAL_TABLET | Freq: Every day | ORAL | 0 refills | Status: DC

## 2016-04-23 MED ORDER — ASPIRIN 81 MG PO TBEC: 81.0000 mg | DELAYED_RELEASE_TABLET | Freq: Every day | ORAL | 0 refills | Status: DC

## 2016-04-23 MED ORDER — POTASSIUM CHLORIDE CRYS ER 20 MEQ PO TBCR
20.0000 meq | EXTENDED_RELEASE_TABLET | Freq: Every day | ORAL | Status: DC
  Administered 2016-04-23: 20 meq via ORAL
  Filled 2016-04-23: qty 1

## 2016-04-23 MED ORDER — SIMVASTATIN 10 MG PO TABS: 10.0000 mg | ORAL_TABLET | Freq: Every day | ORAL | Status: DC

## 2016-04-23 NOTE — Progress Notes (Signed)
  Echocardiogram 2D Echocardiogram has been performed.  [place] Hafen L Androw 04/23/2016, 11:47 AM

## 2016-04-23 NOTE — Care Management Note (Signed)
Case Management Note  Patient Details  Name: Joy Ward MRN: [id number] Date of Birth: 04/21/1941  Subjective/Objective:    Chest pain              Action/Plan: Discharge Planning: AVS reviewed: NCM spoke to pt and states she lives alone. Her dtr will assist as needed. States she was independent prior to hospital stay and could drive to her appts. States she needs assistance with some to clean her home. Explained those services are private pay and not covered by insurance.   PCP  Mack Hook MD  Expected Discharge Date:  1227/2017             Expected Discharge Plan:  Home/Self Care  In-House Referral:  NA  Discharge planning Services  CM Consult  Post Acute Care Choice:  NA Choice offered to:  NA  DME Arranged:  N/A DME Agency:  NA  HH Arranged:  NA HH Agency:  NA  Status of Service:  Completed, signed off  If discussed at [place] of Stay Meetings, dates discussed:    Additional Comments:  Erenest Rasher, RN 04/23/2016, 12:22 PM

## 2016-04-23 NOTE — Discharge Summary (Signed)
Physician Discharge Summary  Joy Ward [id number] DOB: 1940-06-10 DOA: 04/21/2016  PCP: Mack Hook, MD  Admit date: 04/21/2016 Discharge date: 04/23/2016  Admitted From: Home  Disposition:  Home   Recommendations for Outpatient Follow-up:  1. Follow up with PCP in 1-2 weeks 2. Please obtain BMP/CBC in one week   Home Health: yes.   Discharge Condition; stable.  CODE STATUS; full code.  Diet recommendation: Heart Healthy  Brief/Interim Summary: 1-Chest pain; resolved. Stress test negative.  Echo pending at discharge.   2-Right lower extremity pain; doppler negative for DVT. Positive for baker cyst. Symptomatic management . Fu with ortho as needed.    Discharge Diagnoses:  Principal Problem:   Chest pain Active Problems:   Diabetes mellitus type 2, controlled, without complications (HCC)   Essential hypertension   Systolic murmur   Hypercalcemia   Hyperlipidemia    Discharge Instructions  Discharge Instructions    Diet - low sodium heart healthy    Complete by:  As directed    Increase activity slowly    Complete by:  As directed      Allergies as of 04/23/2016      Reactions   Clindamycin Hcl Other (See Comments)   REACTION: C. difficile colitis   Metronidazole Hives   REACTION: hives   Trazodone And Nefazodone Other (See Comments)   Sinus pains   Ace Inhibitors Cough   REACTION: cough   Adhesive [tape] Itching, Rash   Blistering of skin   Levothyroxine Sodium Other (See Comments)   REACTION: Intolerance 3/08. Patient was placed on this to suppress the thyroid nodule with previously normal TSH.      Medication List    STOP taking these medications   meloxicam 7.5 MG tablet Commonly known as:  MOBIC     TAKE these medications   amLODipine 5 MG tablet Commonly known as:  NORVASC Take 5 mg by mouth daily.   aspirin 81 MG EC tablet Take 1 tablet (81 mg total) by mouth daily. Start taking on:  04/24/2016   diclofenac sodium 1  % Gel Commonly known as:  VOLTAREN Apply 4 g topically 2 (two) times daily.   ESTROVEN PO Take 1 tablet by mouth daily.   Fish Oil 1000 MG Caps Take 1 capsule by mouth 2 (two) times daily.   glimepiride 1 MG tablet Commonly known as:  AMARYL Take 2 mg by mouth daily with breakfast.   hydrochlorothiazide 25 MG tablet Commonly known as:  HYDRODIURIL Take 25 mg by mouth daily.   HYDROcodone-acetaminophen 10-325 MG tablet Commonly known as:  NORCO Take 1 tablet by mouth every 6 (six) hours as needed.   levocetirizine 5 MG tablet Commonly known as:  XYZAL Take 5 mg by mouth daily.   levothyroxine 88 MCG tablet Commonly known as:  SYNTHROID, LEVOTHROID Take 88 mcg by mouth daily before breakfast.   losartan 100 MG tablet Commonly known as:  COZAAR Take 100 mg by mouth daily.   LUMIGAN 0.01 % Soln Generic drug:  bimatoprost Place 1 drop into the left eye at bedtime.   MULTIVITAMINS PO Take 1 tablet by mouth daily.   potassium chloride SA 20 MEQ tablet Commonly known as:  K-DUR,KLOR-CON Take 20 mEq by mouth daily.   ranitidine 150 MG tablet Commonly known as:  ZANTAC Take 150 mg by mouth every morning.   simvastatin 10 MG tablet Commonly known as:  ZOCOR Take 1 tablet (10 mg total) by mouth daily at 6 PM.   traMADol  50 MG tablet Commonly known as:  ULTRAM Take 50 mg by mouth every 12 (twelve) hours as needed.   zolpidem 5 MG tablet Commonly known as:  AMBIEN Take 5 mg by mouth at bedtime as needed for sleep.       Allergies  Allergen Reactions  . Clindamycin Hcl Other (See Comments)    REACTION: C. difficile colitis  . Metronidazole Hives    REACTION: hives  . Trazodone And Nefazodone Other (See Comments)    Sinus pains  . Ace Inhibitors Cough    REACTION: cough  . Adhesive [Tape] Itching and Rash    Blistering of skin   . Levothyroxine Sodium Other (See Comments)    REACTION: Intolerance 3/08. Patient was placed on this to suppress the thyroid  nodule with previously normal TSH.    Consultations:  Cardiology    Procedures/Studies: Dg Chest 2 View  Result Date: 04/22/2016 CLINICAL DATA:  Intermittent chest pain for 8 hours. EXAM: CHEST  2 VIEW COMPARISON:  Radiographs and CT April 2011 FINDINGS: Normal heart size. There is tortuosity of the thoracic aorta, unchanged. No pulmonary edema. No focal airspace disease, pleural fluid or pneumothorax. No acute osseous abnormalities. Postsurgical change in the lumbar spine is partially included. Mild scoliosis. IMPRESSION: No active cardiopulmonary disease. Electronically Signed   By: Jeb Levering M.D.   On: 04/22/2016 00:47   Nm Myocar Multi W/spect W/wall Motion / Ef  Result Date: 04/22/2016 CLINICAL DATA:  Chest pain 04/21/2016. History of diabetes and smoking. EXAM: MYOCARDIAL IMAGING WITH SPECT (REST AND PHARMACOLOGIC-STRESS) GATED LEFT VENTRICULAR WALL MOTION STUDY LEFT VENTRICULAR EJECTION FRACTION TECHNIQUE: Standard myocardial SPECT imaging was performed after resting intravenous injection of 10 mCi Tc-36m tetrofosmin. Subsequently, intravenous infusion of Lexiscan was performed under the supervision of the Cardiology staff. At peak effect of the drug, 30 mCi Tc-21m tetrofosmin was injected intravenously and standard myocardial SPECT imaging was performed. Quantitative gated imaging was also performed to evaluate left ventricular wall motion, and estimate left ventricular ejection fraction. COMPARISON:  None. FINDINGS: Perfusion: No decreased activity in the left ventricle on stress imaging to suggest reversible ischemia or infarction. Wall Motion: Normal left ventricular wall motion. No left ventricular dilation. Hyperdynamic left ventricle. Left Ventricular Ejection Fraction: 75 % End diastolic volume 60 ml End systolic volume 15 ml IMPRESSION: 1. No reversible ischemia or infarction. 2. Normal left ventricular wall motion. 3. Left ventricular ejection fraction 75% 4. Non invasive  risk stratification*: Low risk *2012 Appropriate Use Criteria for Coronary Revascularization Focused Update: J Am Coll Cardiol. N6492421. http://content.airportbarriers.com.aspx?articleid=[email or web address] Electronically Signed   By: Marijo Sanes M.D.   On: 04/22/2016 14:57       Subjective: Feeling well , denies chest pain   Discharge Exam: Vitals:   04/22/16 2048 04/23/16 0412  BP: 139/73 (!) 116/56  Pulse: 85 76  Resp: 18 18  Temp: 97.7 F (36.5 C) 98.2 F (36.8 C)   Vitals:   04/22/16 1325 04/22/16 1359 04/22/16 2048 04/23/16 0412  BP: 120/63 133/65 139/73 (!) 116/56  Pulse: 72  85 76  Resp: 20  18 18   Temp: 97.8 F (36.6 C)  97.7 F (36.5 C) 98.2 F (36.8 C)  TempSrc: Oral  Oral Oral  SpO2: 98%  99% 100%  Weight:      Height:        General: Pt is alert, awake, not in acute distress Cardiovascular: RRR, S1/S2 +, no rubs, no gallops Respiratory: CTA bilaterally, no wheezing, no rhonchi Abdominal: Soft,  NT, ND, bowel sounds + Extremities: no edema, no cyanosis    The results of significant diagnostics from this hospitalization (including imaging, microbiology, ancillary and laboratory) are listed below for reference.     Microbiology: No results found for this or any previous visit (from the past 240 hour(s)).   Labs: BNP (last 3 results) No results for input(s): BNP in the last 8760 hours. Basic Metabolic Panel:  Recent Labs Lab 04/22/16 0033 04/22/16 0448  NA 139  --   K 3.7  --   CL 100*  --   CO2 29  --   GLUCOSE 173*  --   BUN 13  --   CREATININE 0.73 0.70  CALCIUM 10.7*  --    Liver Function Tests: No results for input(s): AST, ALT, ALKPHOS, BILITOT, PROT, ALBUMIN in the last 168 hours. No results for input(s): LIPASE, AMYLASE in the last 168 hours. No results for input(s): AMMONIA in the last 168 hours. CBC:  Recent Labs Lab 04/22/16 0033 04/22/16 0448  WBC 6.5 6.2  NEUTROABS 3.5  --   HGB 12.0 11.5*  HCT 34.8* 34.0*  MCV  86.6 88.8  PLT 221 195   Cardiac Enzymes:  Recent Labs Lab 04/22/16 0448 04/22/16 1256 04/22/16 1559  TROPONINI <0.03 <0.03 <0.03   BNP: Invalid input(s): POCBNP CBG:  Recent Labs Lab 04/22/16 2044 04/23/16 0022 04/23/16 0410 04/23/16 0742 04/23/16 1134  GLUCAP 206* 197* 155* 151* 148*   D-Dimer No results for input(s): DDIMER in the last 72 hours. Hgb A1c No results for input(s): HGBA1C in the last 72 hours. Lipid Profile  Recent Labs  04/23/16 0147  CHOL 246*  HDL 80  LDLCALC 141*  TRIG 124  CHOLHDL 3.1   Thyroid function studies No results for input(s): TSH, T4TOTAL, T3FREE, THYROIDAB in the last 72 hours.  Invalid input(s): FREET3 Anemia work up No results for input(s): VITAMINB12, FOLATE, FERRITIN, TIBC, IRON, RETICCTPCT in the last 72 hours. Urinalysis    Component Value Date/Time   COLORURINE YELLOW 08/21/2009 1913   APPEARANCEUR HAZY (A) 08/21/2009 1913   LABSPEC 1.017 08/21/2009 1913   PHURINE 8.5 (H) 08/21/2009 1913   GLUCOSEU NEGATIVE 08/21/2009 1913   HGBUR NEGATIVE 08/21/2009 1913   HGBUR negative 02/14/2009 0938   BILIRUBINUR NEGATIVE 08/21/2009 1913   KETONESUR NEGATIVE 08/21/2009 1913   PROTEINUR NEGATIVE 08/21/2009 1913   UROBILINOGEN 0.2 08/21/2009 1913   NITRITE NEGATIVE 08/21/2009 1913   LEUKOCYTESUR  08/21/2009 1913    NEGATIVE MICROSCOPIC NOT DONE ON URINES WITH NEGATIVE PROTEIN, BLOOD, LEUKOCYTES, NITRITE, OR GLUCOSE <1000 mg/dL.   Sepsis Labs Invalid input(s): PROCALCITONIN,  WBC,  LACTICIDVEN Microbiology No results found for this or any previous visit (from the past 240 hour(s)).   Time coordinating discharge: Over 30 minutes  SIGNED:   Elmarie Shiley, MD  [facility] 04/23/2016, 12:08 PM Pager   If 7PM-7AM, please contact night-coverage [email or web address] Password [email or web address]

## 2016-04-23 NOTE — Progress Notes (Addendum)
*  Preliminary Results* Right lower extremity venous duplex completed. Right lower extremity is negative for deep vein thrombosis. There is evidence of right Baker's cyst.  04/23/2016 9:44 AM  Maudry Mayhew, BS, RVT, RDCS, RDMS

## 2016-04-27 ENCOUNTER — Encounter: Payer: Self-pay | Admitting: Internal Medicine

## 2016-05-02 ENCOUNTER — Encounter: Payer: Self-pay | Admitting: Internal Medicine

## 2016-05-12 DIAGNOSIS — M7061 Trochanteric bursitis, right hip: Secondary | ICD-10-CM | POA: Diagnosis not present

## 2016-05-12 DIAGNOSIS — M7062 Trochanteric bursitis, left hip: Secondary | ICD-10-CM | POA: Diagnosis not present

## 2016-06-04 ENCOUNTER — Ambulatory Visit (INDEPENDENT_AMBULATORY_CARE_PROVIDER_SITE_OTHER): Payer: Medicare Other | Admitting: Internal Medicine

## 2016-06-04 ENCOUNTER — Encounter: Payer: Self-pay | Admitting: Internal Medicine

## 2016-06-04 VITALS — BP 120/80 | HR 86 | Resp 12 | Ht 63.0 in | Wt 186.0 lb

## 2016-06-04 DIAGNOSIS — M79604 Pain in right leg: Secondary | ICD-10-CM | POA: Diagnosis not present

## 2016-06-04 DIAGNOSIS — E119 Type 2 diabetes mellitus without complications: Secondary | ICD-10-CM | POA: Diagnosis not present

## 2016-06-04 DIAGNOSIS — E782 Mixed hyperlipidemia: Secondary | ICD-10-CM

## 2016-06-04 LAB — GLUCOSE, POCT (MANUAL RESULT ENTRY): POC Glucose: 196 mg/dl — AB (ref 70–99)

## 2016-06-04 MED ORDER — GLIMEPIRIDE 2 MG PO TABS: 2.0000 mg | ORAL_TABLET | Freq: Every day | ORAL | 11 refills | Status: DC

## 2016-06-04 MED ORDER — METFORMIN HCL ER 500 MG PO TB24: 500.0000 mg | ORAL_TABLET | Freq: Every day | ORAL | 11 refills | Status: DC

## 2016-06-04 NOTE — Patient Instructions (Signed)
[facility] [employer] and get started on walking laps in the 3 feet --gradually work up to 30 minutes over months and then 60 minutes over the next year. Would like you to start at 3 times weekly.  Try one of the muscle rubs with camphor and menthol for leg pain as well.--wash hands well after applying.

## 2016-06-04 NOTE — Progress Notes (Signed)
Subjective:    Patient ID: Joy Ward, female    DOB: [birth year], 76 y.o.   MRN: [id number]  HPI   1.  Chest pain end of December 2017:  Had Nuclear medicine stress test negative for ischemic cardiac disease. Had one episode that was short lived since--she feels this is really acid related and antacids do help--Mylanta works well for her and not needed often.   Not much tomato, onion, caffeine, soda,  Alcohol, tobacco that can cause.      Has been seen at another office since last year due to problems with leaving messages she feels were not returned.    2.  Chronic pain:  Sees Dr. Nelva Bush for pain.  History of lumbar laminectomy. Back and legs still with problems.  Had an MRI with Dr.  Nelva Bush.  Performed injection--not clear if facet or epidural that has really helped.  Has not needed oral pain meds as much.  He is in charge of Hydrocodone and Tramadol prescriptions.  3.  Right leg with pain:  Voltaren topically helps.  Has bothered her for months.  Tender to touch.  Is followed by a Dr. Celesta Aver, ortho as well.  Has greater trochanteric bursitis. Willing to get in water and walk laps or hold side and kick.  4.  DM:  Sugars in the mornings, never over 139 and sugars in the afternoons 210 max. Had eye check this year--states no diabetic changes.   Dr. Marylynn Pearson.  5.  Elevated total and LDL:  LDL was 141 though HDL was 80.  Feels her legs hurt with Zocor.  She is really not willing to try another statin.  Would like to try water exercise.    Current Meds  Medication Sig  . amLODipine (NORVASC) 5 MG tablet Take 5 mg by mouth daily.  Marland Kitchen aspirin EC 81 MG EC tablet Take 1 tablet (81 mg total) by mouth daily.  . betamethasone dipropionate (DIPROLENE) 0.05 % cream Apply topically 2 (two) times daily.  . diclofenac sodium (VOLTAREN) 1 % GEL Apply 4 g topically 2 (two) times daily.   Marland Kitchen gabapentin (NEURONTIN) 100 MG capsule Take 100 mg by mouth at bedtime.  Marland Kitchen glimepiride (AMARYL) 1 MG tablet  Take 2 mg by mouth daily with breakfast.   . hydrochlorothiazide (HYDRODIURIL) 25 MG tablet Take 25 mg by mouth daily.  Marland Kitchen HYDROcodone-acetaminophen (NORCO) 10-325 MG tablet Take 1 tablet by mouth every 6 (six) hours as needed.  Marland Kitchen levothyroxine (SYNTHROID, LEVOTHROID) 88 MCG tablet Take 88 mcg by mouth daily before breakfast.  . losartan (COZAAR) 100 MG tablet Take 100 mg by mouth daily.  Marland Kitchen LUMIGAN 0.01 % SOLN Place 1 drop into the left eye at bedtime.  . meloxicam (MOBIC) 7.5 MG tablet Take 7.5 mg by mouth daily. 1 twice a day with food  . Multiple Vitamin (MULTIVITAMINS PO) Take 1 tablet by mouth daily.  . Nutritional Supplements (ESTROVEN PO) Take 1 tablet by mouth daily.  . Omega-3 Fatty Acids (FISH OIL) 1000 MG CAPS Take 1 capsule by mouth 2 (two) times daily.  Marland Kitchen Phenylephrine-APAP-Guaifenesin (TYLENOL SINUS SEVERE PO) Take by mouth. 2 tabs 2-3 times a day as needed  . potassium chloride SA (K-DUR,KLOR-CON) 20 MEQ tablet Take 20 mEq by mouth daily.  . ranitidine (ZANTAC) 150 MG tablet Take 300 mg by mouth every morning.   . traMADol (ULTRAM) 50 MG tablet Take 50 mg by mouth every 12 (twelve) hours as needed.  . zolpidem (AMBIEN) 5  MG tablet Take 5 mg by mouth at bedtime as needed for sleep.    Allergies  Allergen Reactions  . Clindamycin Hcl Other (See Comments)    REACTION: C. difficile colitis  . Metronidazole Hives    REACTION: hives  . Trazodone And Nefazodone Other (See Comments)    Sinus pains  . Statins Other (See Comments)    Side Effect: muscle pain  . Ace Inhibitors Cough    REACTION: cough  . Adhesive [Tape] Itching and Rash    Blistering of skin   . Levothyroxine Sodium Other (See Comments)    REACTION: Intolerance 3/08. Patient was placed on this to suppress the thyroid nodule with previously normal TSH.     Review of Systems     Objective:   Physical Exam   Lungs:  CTA CV: RRR with murmur, Radial pulses normal and equal LE:  No edema, but soft tissue  prominence just below lateral malleolus bilaterally   Tender right LE --spotty diffuse--superficial soft tissues, not over bone     Assessment & Plan:  1.  Chest pain:  Likely noncardiac with recent nuclear stress testing.  Historically sounds GI related and now controlled  2.  DM:  Adding Metformin ER 500 mg daily in the morning with meal.  Increase Glimepiride to 2 mg daily Encouraged increased physical activity with [employer] and getting into water or stationary bike.    3.  Hyperlipidemia:  Intolerant of statins in past and refuses to try another.   HDL quite good at 80, but LDL high as well.  Lifestyle changes as above.  4.  Chronic ortho pain issues:  As per Dr. Nelva Bush and Dr. Celesta Aver.  5.  Patient's concern for memory:  Will address next visit with [facility]  6.  Leg pain, right:  Seems in soft tissue:  To try topical muscle rub and get started on walking laps in pool at [employer].

## 2016-06-29 ENCOUNTER — Encounter: Payer: Self-pay | Admitting: Internal Medicine

## 2016-06-29 ENCOUNTER — Ambulatory Visit (INDEPENDENT_AMBULATORY_CARE_PROVIDER_SITE_OTHER): Payer: Medicare Other | Admitting: Internal Medicine

## 2016-06-29 VITALS — BP 122/82 | HR 86 | Resp 12 | Ht 63.0 in | Wt 192.0 lb

## 2016-06-29 DIAGNOSIS — R609 Edema, unspecified: Secondary | ICD-10-CM | POA: Diagnosis not present

## 2016-06-29 MED ORDER — FUROSEMIDE 20 MG PO TABS: ORAL_TABLET | ORAL | 0 refills | Status: DC

## 2016-06-29 NOTE — Progress Notes (Signed)
Subjective:    Patient ID: Joy Ward, female    DOB: 06-02-1940, 76 y.o.   MRN: [id number]  HPI   Here for acute visit for bilateral lower extremity swelling.  Started about 4 days ago.  Was taking HCTZ 75 mg for past 3 days with some improvement.  States she currently takes 50 mg of HCTZ daily, though we have her as taking just 25 mg daily. Does not believe she changed her diet regarding salty foods.  Later, remembers she cooked greens just before this started with smoked [place] wings, which were likely brined.  She cannot recall if she has been on her feet a lot or with her legs dependent. Later, seems like she has had her legs dependent and since she put pillows under legs last night, the swelling went down  No dyspnea.  Not clear if really had any chest discomfort.  Feels she has had some dyspepsia.    Current Meds  Medication Sig  . amLODipine (NORVASC) 5 MG tablet Take 5 mg by mouth daily.  Marland Kitchen aspirin EC 81 MG EC tablet Take 1 tablet (81 mg total) by mouth daily.  . betamethasone dipropionate (DIPROLENE) 0.05 % cream Apply topically 2 (two) times daily.  . diclofenac sodium (VOLTAREN) 1 % GEL Apply 4 g topically 2 (two) times daily.   Marland Kitchen glimepiride (AMARYL) 2 MG tablet Take 1 tablet (2 mg total) by mouth daily with breakfast.  . hydrochlorothiazide (HYDRODIURIL) 25 MG tablet Take 25 mg by mouth daily.  Marland Kitchen HYDROcodone-acetaminophen (NORCO) 10-325 MG tablet Take 1 tablet by mouth every 6 (six) hours as needed.  Marland Kitchen levocetirizine (XYZAL) 5 MG tablet Take 5 mg by mouth daily.   Marland Kitchen levothyroxine (SYNTHROID, LEVOTHROID) 88 MCG tablet Take 88 mcg by mouth daily before breakfast.  . losartan (COZAAR) 100 MG tablet Take 100 mg by mouth daily.  Marland Kitchen LUMIGAN 0.01 % SOLN Place 1 drop into the left eye at bedtime.  . meloxicam (MOBIC) 7.5 MG tablet Take 7.5 mg by mouth daily. 1 twice a day with food  . metFORMIN (GLUCOPHAGE-XR) 500 MG 24 hr tablet Take 1 tablet (500 mg total) by mouth daily with  breakfast.  . Multiple Vitamin (MULTIVITAMINS PO) Take 1 tablet by mouth daily.  . Nutritional Supplements (ESTROVEN PO) Take 1 tablet by mouth daily.  . Omega-3 Fatty Acids (FISH OIL) 1000 MG CAPS Take 1 capsule by mouth 2 (two) times daily.  Marland Kitchen Phenylephrine-APAP-Guaifenesin (TYLENOL SINUS SEVERE PO) Take by mouth. 2 tabs 2-3 times a day as needed  . potassium chloride SA (K-DUR,KLOR-CON) 20 MEQ tablet Take 20 mEq by mouth daily.  . ranitidine (ZANTAC) 150 MG tablet Take 300 mg by mouth every morning.   . traMADol (ULTRAM) 50 MG tablet Take 50 mg by mouth every 12 (twelve) hours as needed.  . zolpidem (AMBIEN) 5 MG tablet Take 5 mg by mouth at bedtime as needed for sleep.    Allergies  Allergen Reactions  . Clindamycin Hcl Other (See Comments)    REACTION: C. difficile colitis  . Metronidazole Hives    REACTION: hives  . Trazodone And Nefazodone Other (See Comments)    Sinus pains  . Statins Other (See Comments)    Side Effect: muscle pain  . Ace Inhibitors Cough    REACTION: cough  . Adhesive [Tape] Itching and Rash    Blistering of skin       Review of Systems     Objective:   Physical Exam  No JVD CV:  RRR with normal S1 and S2, No S3 or S4.  Grade 1/6 SEM, radial and DP pulses normal and equal Lungs:  CTA Mild Pitting edema to knees     Assessment & Plan:  1.  LE edema: avoid sodium in diet.  Asked her to take just 25 mg of HCTZ daily.  May take Furosemide 20 mg daily as needed when having swelling.  To eat a banana or orange the day she takes the Furosemide.  BMP.   No evidence of CHF today.

## 2016-06-29 NOTE — Patient Instructions (Signed)
Keep appt. For memory evaluation 3/22

## 2016-06-30 ENCOUNTER — Telehealth: Payer: Self-pay | Admitting: Internal Medicine

## 2016-06-30 LAB — BASIC METABOLIC PANEL
BUN / CREAT RATIO: 23 (ref 12–28)
BUN: 17 mg/dL (ref 8–27)
CHLORIDE: 94 mmol/L — AB (ref 96–106)
CO2: 26 mmol/L (ref 18–29)
Calcium: 10.5 mg/dL — ABNORMAL HIGH (ref 8.7–10.3)
Creatinine, Ser: 0.75 mg/dL (ref 0.57–1.00)
GFR, EST AFRICAN AMERICAN: 91 mL/min/{1.73_m2} (ref >59)
GFR, EST NON AFRICAN AMERICAN: 79 mL/min/{1.73_m2} (ref >59)
Glucose: 211 mg/dL — ABNORMAL HIGH (ref 65–99)
POTASSIUM: 3.7 mmol/L (ref 3.5–5.2)
SODIUM: 137 mmol/L (ref 134–144)

## 2016-06-30 NOTE — Telephone Encounter (Signed)
To Dr. Mulberry for further direction 

## 2016-06-30 NOTE — Telephone Encounter (Signed)
Patient called to advise that she is allergic to bananas as Dr. Victoriano Lain had asked her to take with the Rx LASIX 20 mg tablet.    Patient would like to know if she should take an extra potassium tablet instead and should she also keep taking the water pills?

## 2016-07-02 DIAGNOSIS — M961 Postlaminectomy syndrome, not elsewhere classified: Secondary | ICD-10-CM | POA: Diagnosis not present

## 2016-07-02 DIAGNOSIS — M5136 Other intervertebral disc degeneration, lumbar region: Secondary | ICD-10-CM | POA: Diagnosis not present

## 2016-07-04 NOTE — Telephone Encounter (Signed)
Discussed with patient in person on Thursday

## 2016-07-05 ENCOUNTER — Emergency Department (HOSPITAL_COMMUNITY): Payer: Medicare Other

## 2016-07-05 ENCOUNTER — Encounter (HOSPITAL_COMMUNITY): Payer: Self-pay | Admitting: *Deleted

## 2016-07-05 ENCOUNTER — Emergency Department (HOSPITAL_COMMUNITY)
Admission: EM | Admit: 2016-07-05 | Discharge: 2016-07-06 | Disposition: A | Discharge status: Home / Self Care | Payer: Medicare Other | Attending: Emergency Medicine | Admitting: Emergency Medicine

## 2016-07-05 DIAGNOSIS — E1165 Type 2 diabetes mellitus with hyperglycemia: Secondary | ICD-10-CM | POA: Diagnosis not present

## 2016-07-05 DIAGNOSIS — E039 Hypothyroidism, unspecified: Secondary | ICD-10-CM | POA: Diagnosis not present

## 2016-07-05 DIAGNOSIS — R4182 Altered mental status, unspecified: Secondary | ICD-10-CM | POA: Diagnosis present

## 2016-07-05 DIAGNOSIS — I10 Essential (primary) hypertension: Secondary | ICD-10-CM | POA: Diagnosis not present

## 2016-07-05 DIAGNOSIS — R531 Weakness: Secondary | ICD-10-CM | POA: Insufficient documentation

## 2016-07-05 DIAGNOSIS — Z87891 Personal history of nicotine dependence: Secondary | ICD-10-CM | POA: Insufficient documentation

## 2016-07-05 DIAGNOSIS — Z7982 Long term (current) use of aspirin: Secondary | ICD-10-CM | POA: Insufficient documentation

## 2016-07-05 DIAGNOSIS — R404 Transient alteration of awareness: Secondary | ICD-10-CM | POA: Diagnosis not present

## 2016-07-05 DIAGNOSIS — R739 Hyperglycemia, unspecified: Secondary | ICD-10-CM

## 2016-07-05 DIAGNOSIS — Z7984 Long term (current) use of oral hypoglycemic drugs: Secondary | ICD-10-CM | POA: Insufficient documentation

## 2016-07-05 DIAGNOSIS — Z8673 Personal history of transient ischemic attack (TIA), and cerebral infarction without residual deficits: Secondary | ICD-10-CM | POA: Diagnosis not present

## 2016-07-05 LAB — COMPREHENSIVE METABOLIC PANEL
ALK PHOS: 76 U/L (ref 38–126)
ALT: 75 U/L — ABNORMAL HIGH (ref 14–54)
ANION GAP: 9 (ref 5–15)
AST: 48 U/L — ABNORMAL HIGH (ref 15–41)
Albumin: 3.5 g/dL (ref 3.5–5.0)
BILIRUBIN TOTAL: 0.4 mg/dL (ref 0.3–1.2)
BUN: 13 mg/dL (ref 6–20)
CALCIUM: 9.5 mg/dL (ref 8.9–10.3)
CO2: 26 mmol/L (ref 22–32)
Chloride: 102 mmol/L (ref 101–111)
Creatinine, Ser: 0.74 mg/dL (ref 0.44–1.00)
GFR calc non Af Amer: >60 mL/min (ref >60)
GLUCOSE: 258 mg/dL — AB (ref 65–99)
Potassium: 3.5 mmol/L (ref 3.5–5.1)
Sodium: 137 mmol/L (ref 135–145)
TOTAL PROTEIN: 6.2 g/dL — AB (ref 6.5–8.1)

## 2016-07-05 LAB — TROPONIN I: Troponin I: <0.03 ng/mL (ref <0.03)

## 2016-07-05 LAB — URINALYSIS, ROUTINE W REFLEX MICROSCOPIC
BILIRUBIN URINE: NEGATIVE
Glucose, UA: >=500 mg/dL — AB
Hgb urine dipstick: NEGATIVE
Ketones, ur: NEGATIVE mg/dL
LEUKOCYTES UA: NEGATIVE
Nitrite: NEGATIVE
PROTEIN: NEGATIVE mg/dL
SPECIFIC GRAVITY, URINE: 1.01 (ref 1.005–1.030)
pH: 7 (ref 5.0–8.0)

## 2016-07-05 LAB — CBC
HEMATOCRIT: 35.7 % — AB (ref 36.0–46.0)
HEMOGLOBIN: 12 g/dL (ref 12.0–15.0)
MCH: 29.6 pg (ref 26.0–34.0)
MCHC: 33.6 g/dL (ref 30.0–36.0)
MCV: 88.1 fL (ref 78.0–100.0)
Platelets: 200 10*3/uL (ref 150–400)
RBC: 4.05 MIL/uL (ref 3.87–5.11)
RDW: 13.1 % (ref 11.5–15.5)
WBC: 5.1 10*3/uL (ref 4.0–10.5)

## 2016-07-05 LAB — PROTIME-INR
INR: 1.02
Prothrombin Time: 13.4 seconds (ref 11.4–15.2)

## 2016-07-05 LAB — APTT: APTT: 32 s (ref 24–36)

## 2016-07-05 MED ORDER — SODIUM CHLORIDE 0.9 % IV BOLUS (SEPSIS)
500.0000 mL | Freq: Once | INTRAVENOUS | Status: AC
  Administered 2016-07-05: 500 mL via INTRAVENOUS

## 2016-07-05 NOTE — ED Notes (Signed)
Pt states she went to church this morning and when she came home she took a nap. Pt states when she woke up she felt funny feeling.

## 2016-07-05 NOTE — ED Provider Notes (Signed)
[facility] DEPT Provider Note   CSN: [id number] Arrival date & time: 07/05/16  1944     History   Chief Complaint Chief Complaint  Patient presents with  . Altered Mental Status    HPI Joy Ward is a 76 y.o. female.  HPI   76 yo F with PMH/o HTN, DM, HLD brought in by EMS who presents with BLE weakness and confusion. Patient states that she came home from church at 1430 and took a nap. She is unsure of what time she woke up, but states that when she did she felt confused and "disoriented in her own house" and experienced some BLE "heaviness." Additionally, patient states that everything in her house "felt bright, like there was too much light." She was able to walk around her house and call her daughter during this episode. She is uncertain how long the episode of symptoms lasted. On ED arrival, symptoms have resolved but she states that she still feels "off." Patient denies feeling ill prior to today's episode. She denies smoking, ETOH use, or illicit drug use. She denies any CP, SOB, abdominal pain, nausea/vomiting.    Past Medical History:  Diagnosis Date  . Allergy   . Arthritis   . Diabetes mellitus without complication ([place])   . Former tobacco use   . Hyperlipidemia    a. H/o muscle aches with statins per PCP notes.  . Hypertension   . Hypothyroidism    a. prior thyroid removal.  . Stroke ([place])    a. Listed in PCP notes: "History of stroke:  History of terrible headache.  Had a CT scan of brain and found "ministroke"  Was removed from ERT subsequently. "    Patient Active Problem List   Diagnosis Date Noted  . Chest pain 04/22/2016  . Systolic murmur 40/11/6759  . Hypercalcemia 04/22/2016  . Hyperlipidemia 04/22/2016  . Baker's cyst of knee, right 04/21/2016  . HYPOKALEMIA 01/24/2010  . CONSTIPATION 12/23/2009  . INTERMITTENT VERTIGO 12/23/2009  . SINUSITIS, ACUTE 04/25/2009  . VAGINITIS, CANDIDAL 02/14/2009  . HIP PAIN, RIGHT 02/14/2009  .  HYPERLIPIDEMIA, MIXED 01/01/2009  . BACK PAIN, LUMBAR, WITH RADICULOPATHY 12/25/2008  . KNEE PAIN, BILATERAL 12/18/2008  . TROCHANTERIC BURSITIS, BILATERAL 12/18/2008  . SLEEP DISORDER, CHRONIC 12/18/2008  . ONYCHOMYCOSIS, TOENAILS 10/30/2008  . ENTERITIS, CLOSTRIDIUM DIFFICILE 10/15/2006  . Diabetes mellitus type 2, controlled, without complications ([place]) 95/12/3265  . Essential hypertension 09/18/2006  . Allergic rhinitis 09/18/2006  . GOITER, MULTINODULAR 06/29/2006  . STROKE 04/27/2001    Past Surgical History:  Procedure Laterality Date  . ABDOMINAL HYSTERECTOMY  1994   TAH, not clear if unilateral oophorectomy as well.  Marland Kitchen [place]  . CHOLECYSTECTOMY  1973   open  . DECOMPRESSIVE LUMBAR LAMINECTOMY LEVEL 4  2005   In [place]  . THYROIDECTOMY  2012   multinodular goiter    OB History    No data available       Home Medications    Prior to Admission medications   Medication Sig Start Date End Date Taking? Authorizing Provider  amLODipine (NORVASC) 5 MG tablet Take 5 mg by mouth daily.   Yes Historical Provider, MD  bimatoprost (LUMIGAN) 0.01 % SOLN Place 1 drop into the left eye at bedtime.   Yes Historical Provider, MD  diclofenac sodium (VOLTAREN) 1 % GEL Apply 4 g topically 2 (two) times daily as needed (pain).    Yes Historical Provider, MD  furosemide (LASIX) 20 MG tablet  1 tab by mouth daily if swelling of legs.  Eat a banana too. Patient taking differently: Take 20 mg by mouth daily as needed (leg swelling (eat a banana with each dose)).  06/29/16  Yes Mack Hook, MD  glimepiride (AMARYL) 2 MG tablet Take 1 tablet (2 mg total) by mouth daily with breakfast. 06/04/16  Yes Mack Hook, MD  hydrochlorothiazide (HYDRODIURIL) 25 MG tablet Take 25 mg by mouth daily.   Yes Historical Provider, MD  HYDROcodone-acetaminophen (NORCO) 10-325 MG tablet Take 1 tablet by mouth every 6 (six) hours as needed (pain).    Yes Historical  Provider, MD  levothyroxine (SYNTHROID, LEVOTHROID) 88 MCG tablet Take 88 mcg by mouth daily before breakfast.   Yes Historical Provider, MD  losartan (COZAAR) 100 MG tablet Take 100 mg by mouth daily.   Yes Historical Provider, MD  metFORMIN (GLUCOPHAGE-XR) 500 MG 24 hr tablet Take 1 tablet (500 mg total) by mouth daily with breakfast. 06/04/16  Yes Mack Hook, MD  Multiple Vitamin (MULTIVITAMIN WITH MINERALS) TABS tablet Take 1 tablet by mouth daily.   Yes Historical Provider, MD  Omega-3 Fatty Acids (FISH OIL) 1000 MG CAPS Take 1,000 mg by mouth.    Yes Historical Provider, MD  polyethylene glycol (MIRALAX / GLYCOLAX) packet Take 17 g by mouth.   Yes Historical Provider, MD  potassium chloride SA (K-DUR,KLOR-CON) 20 MEQ tablet Take 20 mEq by mouth daily.   Yes Historical Provider, MD  ranitidine (ZANTAC) 300 MG tablet Take 300 mg by mouth daily. 04/13/16  Yes Historical Provider, MD  tiZANidine (ZANAFLEX) 4 MG tablet Take 4 mg by mouth 3 (three) times daily as needed for muscle spasms.  05/12/16  Yes Historical Provider, MD  traMADol (ULTRAM) 50 MG tablet Take 50 mg by mouth every 6 (six) hours as needed (pain).    Yes Historical Provider, MD  zolpidem (AMBIEN) 5 MG tablet Take 5 mg by mouth at bedtime as needed for sleep.   Yes Historical Provider, MD  aspirin EC 81 MG EC tablet Take 1 tablet (81 mg total) by mouth daily. 04/24/16   Belkys A Regalado, MD  betamethasone dipropionate (DIPROLENE) 0.05 % cream Apply topically 2 (two) times daily.    Historical Provider, MD  gabapentin (NEURONTIN) 100 MG capsule Take 100 mg by mouth at bedtime.    Historical Provider, MD  levocetirizine (XYZAL) 5 MG tablet Take 5 mg by mouth daily.     Historical Provider, MD  meloxicam (MOBIC) 7.5 MG tablet Take 7.5 mg by mouth daily. 1 twice a day with food    Historical Provider, MD  Nutritional Supplements (ESTROVEN PO) Take 1 tablet by mouth daily.    Historical Provider, MD  Phenylephrine-APAP-Guaifenesin  (TYLENOL SINUS SEVERE PO) Take by mouth. 2 tabs 2-3 times a day as needed    Historical Provider, MD  simvastatin (ZOCOR) 10 MG tablet Take 1 tablet (10 mg total) by mouth daily at 6 PM. Patient not taking: Reported on 06/04/2016 04/23/16   Elmarie Shiley, MD    Family History Family History  Problem Relation Age of Onset  . Heart disease Mother     unclear details "atherosclerosis"  . Diabetes Mother   . Alcohol abuse Sister   . Diabetes Brother   . Diabetes Brother   . Drug abuse Brother   . Diabetes Daughter   . Heart disease Son     CHF  . Alcohol abuse Son     Social History Social History  Substance Use Topics  . Smoking status: Former Research scientist (life sciences)  . Smokeless tobacco: Never Used     Comment: No smoking since 76 yo - smoked 10 yrs  . Alcohol use No     Allergies   Clindamycin hcl; Metronidazole; Trazodone and nefazodone; Statins; Ace inhibitors; and Adhesive [tape]   Review of Systems Review of Systems  Neurological: Positive for weakness. Negative for speech difficulty.  All other systems reviewed and are negative.    Physical Exam Updated Vital Signs BP 132/76   Pulse 81   Resp 17   Ht 5\' 3"  (1.6 m)   Wt 86.2 kg   SpO2 100%   BMI 33.66 kg/m   Physical Exam  Constitutional: She is oriented to person, place, and time. She appears well-developed and well-nourished.  HENT:  Head: Normocephalic and atraumatic.  Eyes: Conjunctivae and lids are normal.  Small pupils bilaterally.   Neck: Full passive range of motion without pain.  Cardiovascular: Normal rate, regular rhythm, normal heart sounds and normal pulses.   No murmur heard. Pulmonary/Chest: Effort normal and breath sounds normal.  Abdominal: Soft. Normal appearance. There is no tenderness. There is no rigidity and no guarding.  Musculoskeletal: Normal range of motion.  Neurological: She is alert and oriented to person, place, and time.  CN II-XII 5/5 strength to BUE and BLE.  Sensation intact  throughout.  Negative pronator drift. Normal finger to nose. No dysdiadochokinesia.  No facial drooping noted.  Speech is coherent and without slurring.   Psychiatric: She has a normal mood and affect. Her speech is normal and behavior is normal.  Nursing note and vitals reviewed.    ED Treatments / Results  Labs (all labs ordered are listed, but only abnormal results are displayed) Labs Reviewed  CBC - Abnormal; Notable for the following:       Result Value   HCT 35.7 (*)    All other components within normal limits  COMPREHENSIVE METABOLIC PANEL - Abnormal; Notable for the following:    Glucose, Bld 258 (*)    Total Protein 6.2 (*)    AST 48 (*)    ALT 75 (*)    All other components within normal limits  URINALYSIS, ROUTINE W REFLEX MICROSCOPIC - Abnormal; Notable for the following:    Glucose, UA >=500 (*)    Bacteria, UA RARE (*)    Squamous Epithelial / LPF 0-5 (*)    All other components within normal limits  PROTIME-INR  APTT  TROPONIN I  RAPID URINE DRUG SCREEN, HOSP PERFORMED    EKG  EKG Interpretation  Date/Time:  Sunday July 05 2016 19:50:58 EDT Ventricular Rate:  79 PR Interval:    QRS Duration: 90 QT Interval:  376 QTC Calculation: 431 R Axis:   61 Text Interpretation:  Sinus rhythm Prolonged PR interval since last tracing no significant change Confirmed by MILLER  MD, BRIAN ([id number]) on 07/05/2016 8:05:07 PM       Radiology Ct Head Wo Contrast  Result Date: 07/05/2016 CLINICAL DATA:  Weakness EXAM: CT HEAD WITHOUT CONTRAST TECHNIQUE: Contiguous axial images were obtained from the base of the skull through the vertex without intravenous contrast. COMPARISON:  None. FINDINGS: Brain: There is no evidence for acute hemorrhage, hydrocephalus, mass lesion, or abnormal extra-axial fluid collection. No definite CT evidence for acute infarction. Diffuse loss of parenchymal volume is consistent with atrophy. Patchy low attenuation in the deep hemispheric and  periventricular white matter is nonspecific, but likely reflects chronic microvascular ischemic  demyelination. Vascular: Atherosclerotic calcification is visualized in the carotid arteries. No dense MCA sign. Major dural sinuses are unremarkable. Skull: No evidence for fracture. No worrisome lytic or sclerotic lesion. Sinuses/Orbits: The visualized paranasal sinuses and mastoid air cells are clear. Visualized portions of the globes and intraorbital fat are unremarkable. Other: None. IMPRESSION: 1. No acute intracranial abnormality. 2. Atrophy with chronic small vessel white matter ischemic demyelination. Electronically Signed   By: Misty Stanley M.D.   On: 07/05/2016 22:23    Procedures Procedures (including critical care time)  Medications Ordered in ED Medications  sodium chloride 0.9 % bolus 500 mL (0 mLs Intravenous Stopped 07/06/16 0004)     Initial Impression / Assessment and Plan / ED Course  I have reviewed the triage vital signs and the nursing notes.  Pertinent labs & imaging results that were available during my care of the patient were reviewed by me and considered in my medical decision making (see chart for details).    76 yo F with PMH/o HTN, HLD, DM, history of TIA, who presents with confusion and bilateral lower extremity "heaviness" that occurred when patient woke up from a nap. On ED arrival, symptoms have resolved but patient still feels off. Patient states that she does chronically taken vicodin for lumbar back pain.  Physical exam with unremarkable neuro exam. Consider TIA vs ICH vs UTI vs infectious etiology vs drug intoxication. Low suspicion for TIA given history of symptoms. Plan to check basic labs including CBC, CMP, UA, UDS, Coags, Troponin, EKG. Will check CT Head to eval for any acute hemorrhage or mass.   8:31 PM: Patient told MD that she took a Zanaflex before going to sleep.   12:05 Am: Re-eval: Patient much more alert than on initial arrival. Conversing well  and reports feeling improved. Labs and imaging reviewed. CT head with no acute hemorrhagic process or mass identified. Low likelihood of TIA given atypical presentation of symptoms.  Symptoms likely a result of patient taking Zanaflex prior to going to sleep. Will plan to discharge patient home with close follow-up. Discussed results with patient and expressed need for follow-up with her PCP in 2 days. Advised patient to return to the ED immediately for any weakness, dizziness, slurred speech, vision changes. Patient and granddaughter expresses understanding and agreement with plan,     Final Clinical Impressions(s) / ED Diagnoses   Final diagnoses:  Generalized weakness  Hyperglycemia    New Prescriptions New Prescriptions   No medications on file     [place], [place] 07/06/16 0022    Noemi Chapel, MD 07/06/16 1012

## 2016-07-05 NOTE — ED Notes (Signed)
cbg by ems was [phone]

## 2016-07-05 NOTE — ED Triage Notes (Signed)
The pt arrived by gems from home  The pt lives alone and wnet to sleep at 1430  She woke up at 1830.  When she woke up from her nap  She felt confused and had some difficulty walking and talking.  By the time gems arrived she was totally normal on arrival here she is alert equal grips no speech problem   No pain anywhere.

## 2016-07-05 NOTE — ED Provider Notes (Signed)
Hx of hyeprtension/ DM and cholesterol - when pt woke up after nap this afternoon she felt a heaviness in both legs, disoriented and didn't know where she was - had a feeling that the entire house felt brighter - unsure how long it lasted - called her daughter who called EMS.    The sx have now resolved - except for feeling "off" - had recently been feeling well and had no problem at church this morning.  She is on vicodin but states that she didn't take one - she thinks she took a xanaflex before falling asleep.    Physical:  Neuro without weakness, numbness, speech problems or coordination problems, no drift, normal FNF, sensation intact.  Alert and Oriented X 3 - cardiac / pulmonary are unremarkable without murmurs or tachyacrdia.  She appears mildly fatigue but has normal level of alertness and has normal strength diffusely - her memory is in tact.  Anticipate d/c if studies normal.   EKG Interpretation  Date/Time:  Sunday July 05 2016 19:50:58 EDT Ventricular Rate:  79 PR Interval:    QRS Duration: 90 QT Interval:  376 QTC Calculation: 431 R Axis:   61 Text Interpretation:  Sinus rhythm Prolonged PR interval since last tracing no significant change Confirmed by Sabra Heck  MD, Shallyn Constancio ([id number]) on 07/05/2016 8:05:07 PM Also confirmed by Sabra Heck  MD, Jerome ([id number]), editor [place]  CCT, [place] ([id number])  on 07/06/2016 7:02:52 AM       Medical screening examination/treatment/procedure(s) were conducted as a shared visit with non-physician practitioner(s) and myself.  I personally evaluated the patient during the encounter.  Clinical Impression:   Final diagnoses:  Generalized weakness  Hyperglycemia         Noemi Chapel, MD 07/06/16 1000

## 2016-07-06 NOTE — Discharge Instructions (Signed)
Follow-up with your doctor in 2 days.   Return to the ED for any weakness, difficulty speaking, facial drooping, chest pain.

## 2016-07-15 ENCOUNTER — Telehealth: Payer: Self-pay | Admitting: Internal Medicine

## 2016-07-15 ENCOUNTER — Encounter: Payer: Self-pay | Admitting: Internal Medicine

## 2016-07-15 ENCOUNTER — Ambulatory Visit (INDEPENDENT_AMBULATORY_CARE_PROVIDER_SITE_OTHER): Payer: Medicaid Other | Admitting: Internal Medicine

## 2016-07-15 VITALS — BP 140/84 | HR 88 | Resp 12 | Ht 63.0 in | Wt 189.0 lb

## 2016-07-15 DIAGNOSIS — R413 Other amnesia: Secondary | ICD-10-CM | POA: Diagnosis not present

## 2016-07-15 DIAGNOSIS — E119 Type 2 diabetes mellitus without complications: Secondary | ICD-10-CM

## 2016-07-15 LAB — GLUCOSE, POCT (MANUAL RESULT ENTRY): POC Glucose: 219 mg/dl — AB (ref 70–99)

## 2016-07-15 NOTE — Progress Notes (Signed)
Subjective:    Patient ID: Joy Ward, female    DOB: 1940-11-09, 76 y.o.   MRN: [id number]  HPI   Here for formal memory evaluation today.  We went through her meds as recently she had an adverse reaction to taking Zanaflex before lying down for a nap--heavy legs, disoriented in her home, crying.  Ultimately seen in ED where her exam and evaluation were normal.  She has not taken since.  Long list of medication.  She feels she has had difficulties since 2013 or 2014, which is about the same time as she recognized her sister has significant dementia  She feels her memory issues have been a gradual decline, not stepwise. Short term memory is the worse.  Cannot remember what she is reading.   Finds she writes new information down right away as she is afraid she will forget it.   Has never left her stove on and walked away.  Has never gotten lost outside of her home. No problem with gait.  No urinary incontinence.   Has a sister 2 years younger with Alzheimer's.  She is significantly affected.     Current Meds  Medication Sig  . amLODipine (NORVASC) 5 MG tablet Take 5 mg by mouth daily.  Marland Kitchen aspirin EC 81 MG EC tablet Take 1 tablet (81 mg total) by mouth daily.  . betamethasone dipropionate (DIPROLENE) 0.05 % cream Apply topically 2 (two) times daily.  . bimatoprost (LUMIGAN) 0.01 % SOLN Place 1 drop into the left eye at bedtime.  . diclofenac sodium (VOLTAREN) 1 % GEL Apply 4 g topically 2 (two) times daily as needed (pain).   . furosemide (LASIX) 20 MG tablet 1 tab by mouth daily if swelling of legs.  Eat a banana too.  Marland Kitchen glimepiride (AMARYL) 2 MG tablet Take 1 tablet (2 mg total) by mouth daily with breakfast.  . hydrochlorothiazide (HYDRODIURIL) 25 MG tablet Take 25 mg by mouth 2 (two) times daily.   Marland Kitchen HYDROcodone-acetaminophen (NORCO) 10-325 MG tablet Take 1 tablet by mouth every 6 (six) hours as needed (pain).   Marland Kitchen levocetirizine (XYZAL) 5 MG tablet Take 5 mg by mouth daily.   Marland Kitchen  levothyroxine (SYNTHROID, LEVOTHROID) 88 MCG tablet Take 88 mcg by mouth daily before breakfast.  . losartan (COZAAR) 100 MG tablet Take 100 mg by mouth daily.  . metFORMIN (GLUCOPHAGE-XR) 500 MG 24 hr tablet Take 1 tablet (500 mg total) by mouth daily with breakfast.  . Multiple Vitamin (MULTIVITAMIN WITH MINERALS) TABS tablet Take 1 tablet by mouth daily.  . Nutritional Supplements (ESTROVEN PO) Take 1 tablet by mouth daily.  . Omega-3 Fatty Acids (FISH OIL) 1000 MG CAPS Take 1,000 mg by mouth.   . Phenylephrine-APAP-Guaifenesin (TYLENOL SINUS SEVERE PO) Take by mouth. 2 tabs 2-3 times a day as needed  . polyethylene glycol (MIRALAX / GLYCOLAX) packet Take 17 g by mouth.  . potassium chloride SA (K-DUR,KLOR-CON) 20 MEQ tablet Take 20 mEq by mouth daily.  . ranitidine (ZANTAC) 300 MG tablet Take 300 mg by mouth daily.  . traMADol (ULTRAM) 50 MG tablet Take 50 mg by mouth every 6 (six) hours as needed (pain).   Marland Kitchen zolpidem (AMBIEN) 5 MG tablet Take 5 mg by mouth at bedtime as needed for sleep.    Allergies  Allergen Reactions  . Clindamycin Hcl Other (See Comments)    REACTION: C. difficile colitis  . Metronidazole Hives  . Trazodone And Nefazodone Other (See Comments)    Sinus  pains  . Statins Other (See Comments)    Side Effect: muscle pain Has tried Zocor, Crestor--she does not want to take a statin, period  . Ace Inhibitors Cough  . Adhesive [Tape] Itching, Rash and Other (See Comments)    Blistering of skin      Review of Systems     Objective:   Physical Exam     MMSE - Mini Mental State Exam 07/15/2016  Orientation to time 5  Orientation to Place 5  Registration 3  Attention/ Calculation 5  Recall 3  Recall-comments Initially stated she could not remember and remembered everything else she had been asked, but then came up with the three things to recall.  Language- name 2 objects 2  Language- repeat 1  Language- follow 3 step command 3  Language- read & follow  direction 1  Write a sentence 1  Copy design 1  Total score 30        Assessment & Plan:  1.  Concerns for memory:  No findings of memory loss on exam.  Patient, however quite worried about this.   Not clear if this is an anxiety issue.   Would like for her to counsel with Macie Burows, LCSW to see if this may be more of the issue. Referral written.

## 2016-07-15 NOTE — Telephone Encounter (Signed)
Patient needs refill for hydrochlorothiazide (HYDRODIURL) 25 mg tablet.  Please call into [facility].

## 2016-07-16 ENCOUNTER — Other Ambulatory Visit: Payer: Self-pay

## 2016-07-16 MED ORDER — HYDROCHLOROTHIAZIDE 25 MG PO TABS: 25.0000 mg | ORAL_TABLET | Freq: Two times a day (BID) | ORAL | 3 refills | Status: DC

## 2016-07-16 NOTE — Telephone Encounter (Signed)
Rx faxed to pharmacy  

## 2016-07-20 ENCOUNTER — Telehealth: Payer: Self-pay | Admitting: Licensed Clinical Social Worker

## 2016-07-20 NOTE — Telephone Encounter (Signed)
LCSW called pt re: referral from Dr. Amil Amen. Left VM. This was second attempt at contact since referral.

## 2016-07-22 DIAGNOSIS — H401232 Low-tension glaucoma, bilateral, moderate stage: Secondary | ICD-10-CM | POA: Diagnosis not present

## 2016-07-22 DIAGNOSIS — H2513 Age-related nuclear cataract, bilateral: Secondary | ICD-10-CM | POA: Diagnosis not present

## 2016-07-22 DIAGNOSIS — E119 Type 2 diabetes mellitus without complications: Secondary | ICD-10-CM | POA: Diagnosis not present

## 2016-07-29 DIAGNOSIS — R600 Localized edema: Secondary | ICD-10-CM | POA: Diagnosis not present

## 2016-07-29 DIAGNOSIS — I8311 Varicose veins of right lower extremity with inflammation: Secondary | ICD-10-CM | POA: Diagnosis not present

## 2016-07-29 DIAGNOSIS — I8312 Varicose veins of left lower extremity with inflammation: Secondary | ICD-10-CM | POA: Diagnosis not present

## 2016-08-04 ENCOUNTER — Other Ambulatory Visit: Payer: Medicare Other | Admitting: Licensed Clinical Social Worker

## 2016-08-08 ENCOUNTER — Other Ambulatory Visit: Payer: Self-pay | Admitting: Internal Medicine

## 2016-08-11 ENCOUNTER — Other Ambulatory Visit: Payer: Medicare Other | Admitting: Licensed Clinical Social Worker

## 2016-08-12 DIAGNOSIS — M5136 Other intervertebral disc degeneration, lumbar region: Secondary | ICD-10-CM | POA: Diagnosis not present

## 2016-08-12 DIAGNOSIS — M7062 Trochanteric bursitis, left hip: Secondary | ICD-10-CM | POA: Diagnosis not present

## 2016-08-17 ENCOUNTER — Encounter: Payer: Self-pay | Admitting: Internal Medicine

## 2016-08-20 ENCOUNTER — Other Ambulatory Visit: Payer: Self-pay | Admitting: Internal Medicine

## 2016-08-21 ENCOUNTER — Other Ambulatory Visit: Payer: Self-pay | Admitting: Internal Medicine

## 2016-08-21 MED ORDER — AMLODIPINE BESYLATE 5 MG PO TABS: 5.0000 mg | ORAL_TABLET | Freq: Every day | ORAL | 3 refills | Status: DC

## 2016-08-21 MED ORDER — POTASSIUM CHLORIDE CRYS ER 20 MEQ PO TBCR: 20.0000 meq | EXTENDED_RELEASE_TABLET | Freq: Every day | ORAL | 3 refills | Status: DC

## 2016-08-21 MED ORDER — RANITIDINE HCL 300 MG PO TABS: 300.0000 mg | ORAL_TABLET | Freq: Every day | ORAL | 3 refills | Status: DC

## 2016-08-21 MED ORDER — ACCU-CHEK SOFTCLIX LANCET DEV MISC: 0 refills | Status: DC

## 2016-08-21 MED ORDER — GLIMEPIRIDE 2 MG PO TABS: 2.0000 mg | ORAL_TABLET | Freq: Every day | ORAL | 3 refills | Status: DC

## 2016-08-21 MED ORDER — HYDROCHLOROTHIAZIDE 25 MG PO TABS: ORAL_TABLET | ORAL | 3 refills | Status: DC

## 2016-08-21 MED ORDER — LOSARTAN POTASSIUM 100 MG PO TABS: 100.0000 mg | ORAL_TABLET | Freq: Every day | ORAL | 3 refills | Status: DC

## 2016-08-21 MED ORDER — ACCU-CHEK SOFTCLIX LANCETS MISC: 3 refills | Status: DC

## 2016-08-21 MED ORDER — LEVOTHYROXINE SODIUM 88 MCG PO TABS: 88.0000 ug | ORAL_TABLET | Freq: Every day | ORAL | 3 refills | Status: DC

## 2016-08-21 MED ORDER — METFORMIN HCL ER 500 MG PO TB24: 500.0000 mg | ORAL_TABLET | Freq: Every day | ORAL | 11 refills | Status: DC

## 2016-08-21 MED ORDER — GLUCOSE BLOOD VI STRP: ORAL_STRIP | 3 refills | Status: DC

## 2016-08-21 NOTE — Telephone Encounter (Signed)
Here as problems getting through to get meds filled.

## 2016-08-27 ENCOUNTER — Other Ambulatory Visit: Payer: Medicare Other | Admitting: Licensed Clinical Social Worker

## 2016-09-10 DIAGNOSIS — M11262 Other chondrocalcinosis, left knee: Secondary | ICD-10-CM | POA: Diagnosis not present

## 2016-09-10 DIAGNOSIS — M170 Bilateral primary osteoarthritis of knee: Secondary | ICD-10-CM | POA: Diagnosis not present

## 2016-09-10 DIAGNOSIS — M11261 Other chondrocalcinosis, right knee: Secondary | ICD-10-CM | POA: Diagnosis not present

## 2016-09-13 ENCOUNTER — Ambulatory Visit (HOSPITAL_COMMUNITY)
Admission: EM | Admit: 2016-09-13 | Discharge: 2016-09-13 | Disposition: A | Discharge status: Home / Self Care | Payer: Medicare Other | Attending: Family Medicine | Admitting: Family Medicine

## 2016-09-13 ENCOUNTER — Encounter (HOSPITAL_COMMUNITY): Payer: Self-pay | Admitting: *Deleted

## 2016-09-13 DIAGNOSIS — I10 Essential (primary) hypertension: Secondary | ICD-10-CM | POA: Insufficient documentation

## 2016-09-13 DIAGNOSIS — Z79899 Other long term (current) drug therapy: Secondary | ICD-10-CM | POA: Insufficient documentation

## 2016-09-13 DIAGNOSIS — Z87891 Personal history of nicotine dependence: Secondary | ICD-10-CM | POA: Insufficient documentation

## 2016-09-13 DIAGNOSIS — J029 Acute pharyngitis, unspecified: Secondary | ICD-10-CM | POA: Insufficient documentation

## 2016-09-13 DIAGNOSIS — E119 Type 2 diabetes mellitus without complications: Secondary | ICD-10-CM | POA: Diagnosis not present

## 2016-09-13 DIAGNOSIS — E782 Mixed hyperlipidemia: Secondary | ICD-10-CM | POA: Diagnosis not present

## 2016-09-13 DIAGNOSIS — Z8249 Family history of ischemic heart disease and other diseases of the circulatory system: Secondary | ICD-10-CM | POA: Insufficient documentation

## 2016-09-13 DIAGNOSIS — Z8673 Personal history of transient ischemic attack (TIA), and cerebral infarction without residual deficits: Secondary | ICD-10-CM | POA: Diagnosis not present

## 2016-09-13 DIAGNOSIS — J069 Acute upper respiratory infection, unspecified: Secondary | ICD-10-CM | POA: Diagnosis not present

## 2016-09-13 DIAGNOSIS — Z7984 Long term (current) use of oral hypoglycemic drugs: Secondary | ICD-10-CM | POA: Insufficient documentation

## 2016-09-13 DIAGNOSIS — E890 Postprocedural hypothyroidism: Secondary | ICD-10-CM | POA: Diagnosis not present

## 2016-09-13 DIAGNOSIS — Z833 Family history of diabetes mellitus: Secondary | ICD-10-CM | POA: Diagnosis not present

## 2016-09-13 LAB — POCT RAPID STREP A: STREPTOCOCCUS, GROUP A SCREEN (DIRECT): NEGATIVE

## 2016-09-13 MED ORDER — BENZONATATE 100 MG PO CAPS: 100.0000 mg | ORAL_CAPSULE | Freq: Three times a day (TID) | ORAL | 0 refills | Status: AC | PRN

## 2016-09-13 NOTE — Discharge Instructions (Signed)
It was nice seeing you today. Your strep test is neg. You likely have viral infection. Please use Tessalon as needed for cough. Tylenol as needed for pain. Warm saline gurgle will help with throat pain as well. See PCP soon if no improvement.

## 2016-09-13 NOTE — ED Triage Notes (Signed)
Started 2 days ago with severe sore throat and cough with fatigue.  Unsure if fevers at home.

## 2016-09-13 NOTE — ED Provider Notes (Signed)
[facility]    CSN: [id number] Arrival date & time: 09/13/16  1631     History   Chief Complaint Chief Complaint  Patient presents with  . Sore Throat  . Cough    HPI Joy Ward is a 76 y.o. female.   The history is provided by the patient. No language interpreter was used.  Sore Throat  This is a new problem. Episode onset: 4 days ago. The problem occurs constantly. The problem has been gradually worsening. Pertinent negatives include no chest pain, no headaches and no shortness of breath. Associated symptoms comments: She has associated cough and low grade fever. She has irritation of her chest from coughing. Nothing aggravates the symptoms. Nothing relieves the symptoms. Treatments tried: OTC cough med. The treatment provided no relief.  Cough  Cough characteristics:  Productive Sputum characteristics:  Yellow (No blood in it) Severity:  Moderate Onset quality:  Gradual Duration:  1 day Timing:  Constant Progression:  Worsening Chronicity:  New Smoker: no   Context: sick contacts   Context: not animal exposure, not exposure to allergens and not weather changes   Context comment:  Her daughter was sick with sinus infection Worsened by:  Nothing Ineffective treatments:  Cough suppressants Associated symptoms: sore throat and wheezing   Associated symptoms: no chest pain, no headaches and no shortness of breath     Past Medical History:  Diagnosis Date  . Allergy   . Arthritis   . Diabetes mellitus without complication ([place])   . Former tobacco use   . Hyperlipidemia    a. H/o muscle aches with statins per PCP notes.  . Hypertension   . Hypothyroidism    a. prior thyroid removal.  . Stroke ([place])    a. Listed in PCP notes: "History of stroke:  History of terrible headache.  Had a CT scan of brain and found "ministroke"  Was removed from ERT subsequently. "    Patient Active Problem List   Diagnosis Date Noted  . Chest pain 04/22/2016  .  Systolic murmur 10/25/1599  . Hypercalcemia 04/22/2016  . Hyperlipidemia 04/22/2016  . Baker's cyst of knee, right 04/21/2016  . HYPOKALEMIA 01/24/2010  . CONSTIPATION 12/23/2009  . INTERMITTENT VERTIGO 12/23/2009  . SINUSITIS, ACUTE 04/25/2009  . VAGINITIS, CANDIDAL 02/14/2009  . HIP PAIN, RIGHT 02/14/2009  . HYPERLIPIDEMIA, MIXED 01/01/2009  . BACK PAIN, LUMBAR, WITH RADICULOPATHY 12/25/2008  . KNEE PAIN, BILATERAL 12/18/2008  . TROCHANTERIC BURSITIS, BILATERAL 12/18/2008  . SLEEP DISORDER, CHRONIC 12/18/2008  . ONYCHOMYCOSIS, TOENAILS 10/30/2008  . ENTERITIS, CLOSTRIDIUM DIFFICILE 10/15/2006  . Diabetes mellitus type 2, controlled, without complications ([place]) 09/32/3557  . Essential hypertension 09/18/2006  . Allergic rhinitis 09/18/2006  . GOITER, MULTINODULAR 06/29/2006  . STROKE 04/27/2001    Past Surgical History:  Procedure Laterality Date  . ABDOMINAL HYSTERECTOMY  1994   TAH, not clear if unilateral oophorectomy as well.  Marland Kitchen [place]  . CHOLECYSTECTOMY  1973   open  . DECOMPRESSIVE LUMBAR LAMINECTOMY LEVEL 4  2005   In [place]  . THYROIDECTOMY  2012   multinodular goiter    OB History    No data available       Home Medications    Prior to Admission medications   Medication Sig Start Date End Date Taking? Authorizing Provider  ACCU-CHEK SOFTCLIX LANCETS lancets Use as instructed 08/21/16  Yes Mack Hook, MD  amLODipine (NORVASC) 5 MG tablet Take 1 tablet (5 mg total) by mouth  daily. 08/21/16  Yes Mack Hook, MD  bimatoprost (LUMIGAN) 0.01 % SOLN Place 1 drop into the left eye at bedtime.   Yes [provider]  Blood Glucose Monitoring Suppl (ACCU-CHEK AVIVA PLUS) w/Device KIT use as directed to [place] 08/14/16  Yes Mack Hook, MD  diclofenac sodium (VOLTAREN) 1 % GEL Apply 4 g topically 2 (two) times daily as needed (pain).    Yes [provider]  glimepiride (AMARYL) 2 MG  tablet Take 1 tablet (2 mg total) by mouth daily with breakfast. 08/21/16  Yes Mack Hook, MD  glucose blood (ACCU-CHEK AVIVA) test strip Check sugars twice daily before meals 08/21/16  Yes Mack Hook, MD  hydrochlorothiazide (HYDRODIURIL) 25 MG tablet 1 tab by mouth every morning. 08/21/16  Yes Mack Hook, MD  HYDROcodone-acetaminophen (NORCO) 10-325 MG tablet Take 1 tablet by mouth every 6 (six) hours as needed (pain).    Yes [provider]  Lancet Devices (ACCU-CHEK [facility]) lancets Use as instructed 08/21/16  Yes Mack Hook, MD  levocetirizine (XYZAL) 5 MG tablet Take 5 mg by mouth daily.    Yes [provider]  levothyroxine (SYNTHROID, LEVOTHROID) 88 MCG tablet Take 1 tablet (88 mcg total) by mouth daily before breakfast. 08/21/16  Yes Mack Hook, MD  losartan (COZAAR) 100 MG tablet Take 1 tablet (100 mg total) by mouth daily. 08/21/16  Yes Mack Hook, MD  metFORMIN (GLUCOPHAGE-XR) 500 MG 24 hr tablet Take 1 tablet (500 mg total) by mouth daily with breakfast. 08/21/16  Yes Mack Hook, MD  Multiple Vitamin (MULTIVITAMIN WITH MINERALS) TABS tablet Take 1 tablet by mouth daily.   Yes [provider]  Omega-3 Fatty Acids (FISH OIL) 1000 MG CAPS Take 1,000 mg by mouth.    Yes [provider]  ranitidine (ZANTAC) 300 MG tablet Take 1 tablet (300 mg total) by mouth daily. 08/21/16  Yes Mack Hook, MD  traMADol (ULTRAM) 50 MG tablet Take 50 mg by mouth every 6 (six) hours as needed (pain).    Yes [provider]  zolpidem (AMBIEN) 5 MG tablet Take 5 mg by mouth at bedtime as needed for sleep.   Yes [provider]  aspirin EC 81 MG EC tablet Take 1 tablet (81 mg total) by mouth daily. 04/24/16   Regalado, Belkys A, MD  betamethasone dipropionate (DIPROLENE) 0.05 % cream Apply topically 2 (two) times daily.    [provider]  furosemide (LASIX) 20 MG tablet 1 tab by mouth  daily if swelling of legs.  Eat a banana too. 06/29/16   Mack Hook, MD  Nutritional Supplements (ESTROVEN PO) Take 1 tablet by mouth daily.    [provider]  Phenylephrine-APAP-Guaifenesin (TYLENOL SINUS SEVERE PO) Take by mouth. 2 tabs 2-3 times a day as needed    [provider]  polyethylene glycol (MIRALAX / GLYCOLAX) packet Take 17 g by mouth.    [provider]  potassium chloride SA (K-DUR,KLOR-CON) 20 MEQ tablet Take 1 tablet (20 mEq total) by mouth daily. 08/21/16   Mack Hook, MD    Family History Family History  Problem Relation Age of Onset  . Heart disease Mother        unclear details "atherosclerosis"  . Diabetes Mother   . Alcohol abuse Sister   . Diabetes Brother   . Diabetes Brother   . Drug abuse Brother   . Diabetes Daughter   . Heart disease Son        CHF  .  Alcohol abuse Son     Social History Social History  Substance Use Topics  . Smoking status: Former Research scientist (life sciences)  . Smokeless tobacco: Never Used     Comment: No smoking since 76 yo - smoked 10 yrs  . Alcohol use No     Allergies   Clindamycin hcl; Metronidazole; Trazodone and nefazodone; Statins; Ace inhibitors; and Adhesive [tape]   Review of Systems Review of Systems  HENT: Positive for sore throat.   Respiratory: Positive for cough and wheezing. Negative for shortness of breath.   Cardiovascular: Negative for chest pain.  Gastrointestinal: Negative.   Genitourinary: Negative.   Neurological: Negative for headaches.  All other systems reviewed and are negative.    Physical Exam Triage Vital Signs ED Triage Vitals [09/13/16 1711]  Enc Vitals Group     BP (!) 148/72     Pulse Rate 85     Resp 16     Temp 99.2 F (37.3 C)     Temp src      SpO2 99 %     Weight      Height      Head Circumference      Peak Flow      Pain Score 9     Pain Loc      Pain Edu?      Excl. in [place]?    No data found.   Updated Vital Signs BP (!) 148/72    Pulse 85   Temp 99.2 F (37.3 C)   Resp 16   SpO2 99%   Visual Acuity Right Eye Distance:   Left Eye Distance:   Bilateral Distance:    Right Eye Near:   Left Eye Near:    Bilateral Near:     Physical Exam  Constitutional: She appears well-developed. No distress.  HENT:  Head: Normocephalic.  Right Ear: Tympanic membrane and ear canal normal.  Left Ear: Tympanic membrane normal.  Mouth/Throat: Uvula is midline, oropharynx is clear and moist and mucous membranes are normal.  Eyes: Conjunctivae are normal. Pupils are equal, round, and reactive to light.  Neck: Normal range of motion.  Cardiovascular: Normal rate, regular rhythm and normal heart sounds.   No murmur heard. Pulmonary/Chest: Effort normal and breath sounds normal. No respiratory distress. She has no wheezes. She has no rales.  Lymphadenopathy:    She has no cervical adenopathy.  Nursing note and vitals reviewed.    UC Treatments / Results  Labs (all labs ordered are listed, but only abnormal results are displayed) Labs Reviewed  POCT RAPID STREP A    EKG  EKG Interpretation None       Radiology No results found.  Procedures Procedures (including critical care time)  Medications Ordered in UC Medications - No data to display   Initial Impression / Assessment and Plan / UC Course  I have reviewed the triage vital signs and the nursing notes.  Pertinent labs & imaging results that were available during my care of the patient were reviewed by me and considered in my medical decision making (see chart for details).  Clinical Course as of Sep 13 1833  Sun Sep 13, 2016  1497 Viral pharyngitis. Neg Strep test. Tylenol recommended prn pain with warm saline gurgle. F/U as needed.  [KE]  1835 URI Lung exam benign. Tessalon prescribed prn cough. F/U soon if no improvement or symptoms worsens. She agreed with plan.  [KE]    Clinical Course User Index [KE] Kinnie Feil,  MD      Final  Clinical Impressions(s) / UC Diagnoses   Final diagnoses:  None    New Prescriptions New Prescriptions   No medications on file     Kinnie Feil, MD 09/13/16 1836

## 2016-09-14 ENCOUNTER — Ambulatory Visit (INDEPENDENT_AMBULATORY_CARE_PROVIDER_SITE_OTHER): Payer: Medicare Other | Admitting: Internal Medicine

## 2016-09-14 ENCOUNTER — Encounter: Payer: Self-pay | Admitting: Internal Medicine

## 2016-09-14 VITALS — BP 130/82 | HR 98 | Temp 99.0°F | Resp 12 | Ht 63.0 in | Wt 186.0 lb

## 2016-09-14 DIAGNOSIS — B349 Viral infection, unspecified: Secondary | ICD-10-CM

## 2016-09-14 DIAGNOSIS — J301 Allergic rhinitis due to pollen: Secondary | ICD-10-CM

## 2016-09-14 DIAGNOSIS — E119 Type 2 diabetes mellitus without complications: Secondary | ICD-10-CM | POA: Diagnosis not present

## 2016-09-14 LAB — GLUCOSE, POCT (MANUAL RESULT ENTRY): POC Glucose: 183 mg/dl — AB (ref 70–99)

## 2016-09-14 MED ORDER — FEXOFENADINE-PSEUDOEPHED ER 60-120 MG PO TB12: 1.0000 | ORAL_TABLET | Freq: Two times a day (BID) | ORAL | 2 refills | Status: DC

## 2016-09-14 MED ORDER — POLYETHYLENE GLYCOL 3350 17 G PO PACK: 17.0000 g | PACK | Freq: Every day | ORAL | 3 refills | Status: DC

## 2016-09-14 MED ORDER — FLUTICASONE PROPIONATE 50 MCG/ACT NA SUSP: 2.0000 | Freq: Every day | NASAL | 6 refills | Status: DC

## 2016-09-14 NOTE — Progress Notes (Signed)
Subjective:    Patient ID: Joy Ward, female    DOB: 1940-08-20, 76 y.o.   MRN: [id number]  HPI   Started with cough and tickle in her throat when visiting daughter in [place].  She flew there and back.  Noted her eyes hurting, without itching, but mild watering.  Developed rhinorrhea, but cannot remember what it looked like.  Not sneezing much. Has had sticky light yellow mucous production over last several days.  Started losing her voice about 2 days ago.   Not clear if she has had a fever.   Cough has worsened and having a lot of posterior pharyngeal drainage now.   Not dyspneic. Is taking unknown allergy pill she purchases over the counter.  She thinks it is Loratadine.   She is not aware of anyone around her being ill, except maybe her daughter.  Current Meds  Medication Sig  . ACCU-CHEK SOFTCLIX LANCETS lancets Use as instructed  . amLODipine (NORVASC) 5 MG tablet Take 1 tablet (5 mg total) by mouth daily.  Marland Kitchen aspirin EC 81 MG EC tablet Take 1 tablet (81 mg total) by mouth daily.  . benzonatate (TESSALON PERLES) 100 MG capsule Take 1 capsule (100 mg total) by mouth 3 (three) times daily as needed for cough.  . betamethasone dipropionate (DIPROLENE) 0.05 % cream Apply topically 2 (two) times daily.  . bimatoprost (LUMIGAN) 0.01 % SOLN Place 1 drop into the left eye at bedtime.  . Blood Glucose Monitoring Suppl (ACCU-CHEK AVIVA PLUS) w/Device KIT use as directed to CHECK BLOOD SUGAR  . diclofenac sodium (VOLTAREN) 1 % GEL Apply 4 g topically 2 (two) times daily as needed (pain).   . furosemide (LASIX) 20 MG tablet 1 tab by mouth daily if swelling of legs.  Eat a banana too.  Marland Kitchen glimepiride (AMARYL) 2 MG tablet Take 1 tablet (2 mg total) by mouth daily with breakfast.  . glucose blood (ACCU-CHEK AVIVA) test strip Check sugars twice daily before meals  . hydrochlorothiazide (HYDRODIURIL) 25 MG tablet 1 tab by mouth every morning.  Marland Kitchen HYDROcodone-acetaminophen (NORCO) 10-325 MG tablet  Take 1 tablet by mouth every 6 (six) hours as needed (pain).   Elmore Guise Devices (ACCU-CHEK SOFTCLIX) lancets Use as instructed  . levocetirizine (XYZAL) 5 MG tablet Take 5 mg by mouth daily.   Marland Kitchen levothyroxine (SYNTHROID, LEVOTHROID) 88 MCG tablet Take 1 tablet (88 mcg total) by mouth daily before breakfast.  . losartan (COZAAR) 100 MG tablet Take 1 tablet (100 mg total) by mouth daily.  . metFORMIN (GLUCOPHAGE-XR) 500 MG 24 hr tablet Take 1 tablet (500 mg total) by mouth daily with breakfast.  . Multiple Vitamin (MULTIVITAMIN WITH MINERALS) TABS tablet Take 1 tablet by mouth daily.  . Nutritional Supplements (ESTROVEN PO) Take 1 tablet by mouth daily.  . Omega-3 Fatty Acids (FISH OIL) 1000 MG CAPS Take 1,000 mg by mouth.   . polyethylene glycol (MIRALAX / GLYCOLAX) packet Take 17 g by mouth.  . potassium chloride SA (K-DUR,KLOR-CON) 20 MEQ tablet Take 1 tablet (20 mEq total) by mouth daily.  . ranitidine (ZANTAC) 300 MG tablet Take 1 tablet (300 mg total) by mouth daily.  . traMADol (ULTRAM) 50 MG tablet Take 50 mg by mouth every 6 (six) hours as needed (pain).   Marland Kitchen zolpidem (AMBIEN) 5 MG tablet Take 5 mg by mouth at bedtime as needed for sleep.    Allergies  Allergen Reactions  . Clindamycin Hcl Other (See Comments)    REACTION:  C. difficile colitis  . Metronidazole Hives  . Trazodone And Nefazodone Other (See Comments)    Sinus pains  . Statins Other (See Comments)    Side Effect: muscle pain Has tried Zocor, Crestor--she does not want to take a statin, period  . Ace Inhibitors Cough  . Adhesive [Tape] Itching, Rash and Other (See Comments)    Blistering of skin       Review of Systems     Objective:   Physical Exam   NAD Congested cough, hoarse. HEENT: PERRL, EOMI, conjunctivae mildly injected.  TMs pearly gray, cobbled posterior pharynx.  Nasal mucosa boggy with clear discharge.  Coughs up sticky light yellow mucous. Neck:  Supple, no adenpathy Chest:  CTA CV:  RRR  without murmur or rub, radial pulses normal and Equal Abd:  S, NT, No HSM or mass. LE:  No edema.        Assessment & Plan:  1.  Seasonal Allergies vs. Viral URI:   Allegra D 60 mg twice daily.  Neti pot lavage followed by Fluticasone nasal spray 2 sprays each nostril daily.  May use 1/2 tab of her plain Allegra in the evening instead of Allegra D if keeps her awake. Not clear if insurance will cover above meds, but to take paperwork with her and ask for OTC meds if not covered. Drink lots of water to thin mucous. Call if develops dyspnea. Has an appt. Scheduled already in 2 days.  Will have her follow up then --to cancel if doing well with symptomatic treatment.

## 2016-09-14 NOTE — Patient Instructions (Signed)
Don't use the Neti pot for about 4 hours after you use the Flonase. Do use the Neti pot right before you use the Flonase. Drink 8-10 glasses of water daily. Stop your other allergy pill when taking the Allegra D

## 2016-09-16 ENCOUNTER — Encounter: Payer: Self-pay | Admitting: Internal Medicine

## 2016-09-16 ENCOUNTER — Ambulatory Visit (INDEPENDENT_AMBULATORY_CARE_PROVIDER_SITE_OTHER): Payer: Medicare Other | Admitting: Internal Medicine

## 2016-09-16 VITALS — BP 118/78 | HR 88 | Temp 98.1°F | Resp 12 | Ht 63.0 in | Wt 186.0 lb

## 2016-09-16 DIAGNOSIS — E119 Type 2 diabetes mellitus without complications: Secondary | ICD-10-CM | POA: Diagnosis not present

## 2016-09-16 DIAGNOSIS — J0100 Acute maxillary sinusitis, unspecified: Secondary | ICD-10-CM

## 2016-09-16 LAB — GLUCOSE, POCT (MANUAL RESULT ENTRY): POC Glucose: 230 mg/dl — AB (ref 70–99)

## 2016-09-16 LAB — CULTURE, GROUP A STREP (THRC)

## 2016-09-16 MED ORDER — AZITHROMYCIN 250 MG PO TABS: ORAL_TABLET | ORAL | 0 refills | Status: DC

## 2016-09-16 NOTE — Patient Instructions (Signed)
Don't use the Neti pot for about 4 hours after you use the Flonase. Do use the Neti pot right before you use the Flonase. Drink 8-10 glasses of water daily. Stop your other allergy pill when taking the Allegra D

## 2016-09-16 NOTE — Progress Notes (Signed)
Subjective:    Patient ID: Joy Ward, female    DOB: July 28, 1940, 76 y.o.   MRN: [id number]  HPI   Cough:  Did not remember to go through her papers so did not pick up the Allegra D.  Called last night as cough seemed worse and we went over the Allegra D as important to start.  Has been using the Flonase.  Also using the Tessalon perles she obtained through the urgent care.    Has had one dose of Allegra D, but obtained the 24 hour, not the 12 hour medication.   Felt a bit better this morning when got up.  Mucous still light yellow and sticky.  Coughs up mucous and fairly clear. Is drinking lots of water. No dyspnea. No fever.  DM:  Sugar 170 prior to coming in today.  Is stressed as found out cousin to whom she is close, died today.  He was older than her.    Current Meds  Medication Sig  . ACCU-CHEK SOFTCLIX LANCETS lancets Use as instructed  . amLODipine (NORVASC) 5 MG tablet Take 1 tablet (5 mg total) by mouth daily.  Marland Kitchen aspirin EC 81 MG EC tablet Take 1 tablet (81 mg total) by mouth daily.  . benzonatate (TESSALON PERLES) 100 MG capsule Take 1 capsule (100 mg total) by mouth 3 (three) times daily as needed for cough.  . betamethasone dipropionate (DIPROLENE) 0.05 % cream Apply topically 2 (two) times daily.  . bimatoprost (LUMIGAN) 0.01 % SOLN Place 1 drop into the left eye at bedtime.  . Blood Glucose Monitoring Suppl (ACCU-CHEK AVIVA PLUS) w/Device KIT use as directed to CHECK BLOOD SUGAR  . diclofenac sodium (VOLTAREN) 1 % GEL Apply 4 g topically 2 (two) times daily as needed (pain).   . fexofenadine-pseudoephedrine (ALLEGRA-D) 60-120 MG 12 hr tablet Take 1 tablet by mouth 2 (two) times daily.  . fluticasone (FLONASE) 50 MCG/ACT nasal spray Place 2 sprays into both nostrils daily.  . furosemide (LASIX) 20 MG tablet 1 tab by mouth daily if swelling of legs.  Eat a banana too.  Marland Kitchen glimepiride (AMARYL) 2 MG tablet Take 1 tablet (2 mg total) by mouth daily with breakfast.  .  glucose blood (ACCU-CHEK AVIVA) test strip Check sugars twice daily before meals  . hydrochlorothiazide (HYDRODIURIL) 25 MG tablet 1 tab by mouth every morning.  Marland Kitchen HYDROcodone-acetaminophen (NORCO) 10-325 MG tablet Take 1 tablet by mouth every 6 (six) hours as needed (pain).   Elmore Guise Devices (ACCU-CHEK SOFTCLIX) lancets Use as instructed  . levothyroxine (SYNTHROID, LEVOTHROID) 88 MCG tablet Take 1 tablet (88 mcg total) by mouth daily before breakfast.  . losartan (COZAAR) 100 MG tablet Take 1 tablet (100 mg total) by mouth daily.  . metFORMIN (GLUCOPHAGE-XR) 500 MG 24 hr tablet Take 1 tablet (500 mg total) by mouth daily with breakfast.  . Multiple Vitamin (MULTIVITAMIN WITH MINERALS) TABS tablet Take 1 tablet by mouth daily.  . Nutritional Supplements (ESTROVEN PO) Take 1 tablet by mouth daily.  . Omega-3 Fatty Acids (FISH OIL) 1000 MG CAPS Take 1,000 mg by mouth.   . polyethylene glycol (MIRALAX / GLYCOLAX) packet Take 17 g by mouth daily.  . potassium chloride SA (K-DUR,KLOR-CON) 20 MEQ tablet Take 1 tablet (20 mEq total) by mouth daily.  . ranitidine (ZANTAC) 300 MG tablet Take 1 tablet (300 mg total) by mouth daily.  . traMADol (ULTRAM) 50 MG tablet Take 50 mg by mouth every 6 (six) hours as needed (  pain).   . zolpidem (AMBIEN) 5 MG tablet Take 5 mg by mouth at bedtime as needed for sleep.    Allergies  Allergen Reactions  . Clindamycin Hcl Other (See Comments)    REACTION: C. difficile colitis  . Metronidazole Hives  . Trazodone And Nefazodone Other (See Comments)    Sinus pains  . Statins Other (See Comments)    Side Effect: muscle pain Has tried Zocor, Crestor--she does not want to take a statin, period  . Ace Inhibitors Cough  . Adhesive [Tape] Itching, Rash and Other (See Comments)    Blistering of skin        Review of Systems     Objective:   Physical Exam NAD Congested cough HEENT:  PERRL, EOMI, TMs pearly gray, nasal mucosa swollen and boggy, Tender over  maxillary sinuses bilaterally, throat without injection, mild cobbling. Neck:  Supple, No adenopathy Chest:  CTA CV:  RRR without murmur or rub, radial pulses normal and equal LE:  No edema       Assessment & Plan:  1.  Allergies:  Did not check back with instructions and has not really treated her symptoms adequately To take her 24 hour Allegra D once daily and continue Flonase and fluid intake.  Discussed not drinking juices with blood sugar elevation. Neti pot as well.  2.  Acute Sinusitis:  Azithromycin 500 mg daily for 5 days.

## 2016-10-02 ENCOUNTER — Ambulatory Visit (INDEPENDENT_AMBULATORY_CARE_PROVIDER_SITE_OTHER): Payer: Medicare Other | Admitting: Internal Medicine

## 2016-10-02 ENCOUNTER — Encounter: Payer: Self-pay | Admitting: Internal Medicine

## 2016-10-02 VITALS — BP 136/84 | HR 80 | Temp 98.1°F | Resp 12 | Ht 62.25 in | Wt 184.0 lb

## 2016-10-02 DIAGNOSIS — R5383 Other fatigue: Secondary | ICD-10-CM | POA: Diagnosis not present

## 2016-10-02 DIAGNOSIS — E119 Type 2 diabetes mellitus without complications: Secondary | ICD-10-CM | POA: Diagnosis not present

## 2016-10-02 DIAGNOSIS — R05 Cough: Secondary | ICD-10-CM | POA: Diagnosis not present

## 2016-10-02 DIAGNOSIS — R059 Cough, unspecified: Secondary | ICD-10-CM

## 2016-10-02 LAB — GLUCOSE, POCT (MANUAL RESULT ENTRY): POC Glucose: 205 mg/dl — AB (ref 70–99)

## 2016-10-02 MED ORDER — PREDNISONE 10 MG PO TABS: ORAL_TABLET | ORAL | 0 refills | Status: DC

## 2016-10-02 NOTE — Patient Instructions (Addendum)
Prednisone 10 mg  Day 1:  2 tabs Day 2:  2 tabs Day 3:  2 tabs Day 4:  1 1/2 tabs Day 5:  1 tab Day 6:  1/2 tab Day7:  Done.  Glimepiride 1 tab with first meal of the day and 1/2 tab with second meal of the day.  Check your sugars twice daily and call if your sugars are running in the high 200s regularly.  Take Allegra daily (the one with out the Decongestant that you already have.  Use the Delsym only when you need it.  Nasal saline throughout the day.  Call if you get worse

## 2016-10-02 NOTE — Progress Notes (Signed)
Subjective:    Patient ID: Joy Ward, female    DOB: [birth year], 76 y.o.   MRN: [id number]  HPI   Cough:  States she did have improvement with Zpack and Allegra D.  Did call in and get Delsym subsequently. Still coughing and feels weak.   Head no longer hurts.   Mucous is very thick, but clear when coughs up.  She is having posterior pharyngeal drainage. No fevers.   May feel short of breath when coughing. Did get Allegra D.  She took maybe for 10 days. Has not taken for about 6 days. Notes her cough is worse when she gets outside.  Sneezing outside.   Just doesn't feel like she is getting better as she should.  DM:  Just does not like taking Metformin.  Feels it causes her to gain weight and drives her sugars up.  She is taking this week, but prior, would miss fairly frequently  Current Meds  Medication Sig  . amLODipine (NORVASC) 5 MG tablet Take 1 tablet (5 mg total) by mouth daily.  . betamethasone dipropionate (DIPROLENE) 0.05 % cream Apply topically 2 (two) times daily.  . bimatoprost (LUMIGAN) 0.01 % SOLN Place 1 drop into the left eye at bedtime.  . fluticasone (FLONASE) 50 MCG/ACT nasal spray Place 2 sprays into both nostrils daily.  Marland Kitchen glimepiride (AMARYL) 2 MG tablet Take 1 tablet (2 mg total) by mouth daily with breakfast. (Patient taking differently: Take 2 mg by mouth. 1 daily in the evening)  . hydrochlorothiazide (HYDRODIURIL) 25 MG tablet 1 tab by mouth every morning.  Marland Kitchen HYDROcodone-acetaminophen (NORCO) 10-325 MG tablet Take 1 tablet by mouth every 6 (six) hours as needed (pain).   Marland Kitchen levothyroxine (SYNTHROID, LEVOTHROID) 88 MCG tablet Take 1 tablet (88 mcg total) by mouth daily before breakfast.  . losartan (COZAAR) 100 MG tablet Take 1 tablet (100 mg total) by mouth daily.  . metFORMIN (GLUCOPHAGE-XR) 500 MG 24 hr tablet Take 1 tablet (500 mg total) by mouth daily with breakfast.  . Multiple Vitamin (MULTIVITAMIN WITH MINERALS) TABS tablet Take 1 tablet by mouth  daily.  . Nutritional Supplements (ESTROVEN PO) Take 1 tablet by mouth daily.  . Omega-3 Fatty Acids (FISH OIL) 1000 MG CAPS Take 1,000 mg by mouth.   . polyethylene glycol (MIRALAX / GLYCOLAX) packet Take 17 g by mouth daily.  . potassium chloride SA (K-DUR,KLOR-CON) 20 MEQ tablet Take 1 tablet (20 mEq total) by mouth daily.  . ranitidine (ZANTAC) 300 MG tablet Take 1 tablet (300 mg total) by mouth daily.  . traMADol (ULTRAM) 50 MG tablet Take 50 mg by mouth every 6 (six) hours as needed (pain).   Marland Kitchen zolpidem (AMBIEN) 5 MG tablet Take 5 mg by mouth at bedtime as needed for sleep.       Review of Systems     Objective:   Physical Exam  NAD Voice mildly hoarse HEENT:  NT over sinus areas, PERRL, EOMI, TMs pearly gray, throat with mild cobbling.  Nasal mucosa mildly boggy, Neck:  Supple, No adenopathy Chest:  CTA CV:  RRR without murmur or rub, radial pulses normal and equal LE:  No edema        Assessment & Plan:  1.  Cough:  Suspect this has been a viral process with overlying allergy symptoms as well as her symptoms worsen when outside. Encouraged her to stay on the Allegra 180 mg daily. Prednisone 20 mg daily for 3 days, then taper daily by  5 mg until off. Delsym twice daily as needed to calm cough as well. CXR as well. Wrote out and highlighted all of her recommendations and went over directions in detailed fashion.  2.  DM:  Stop Metformin for patient preference. Increase glimepiride to 2 mg with first meal and 1 mg with last meal of day.   To call clinic over weekend or Monday if sugars start running in high 200s regularly with prednisone  3.  Probable Depression: complaints of memory and sleep issues and just seems unhappy.  Tried to set up with Macie Burows, LCSW previously and either did not show or canceled.  Will discuss this again at follow up visit in 1-2 weeks.

## 2016-10-03 LAB — COMPREHENSIVE METABOLIC PANEL
A/G RATIO: 1.4 (ref 1.2–2.2)
ALBUMIN: 3.9 g/dL (ref 3.5–4.8)
ALT: 106 IU/L — ABNORMAL HIGH (ref 0–32)
AST: 53 IU/L — AB (ref 0–40)
Alkaline Phosphatase: 85 IU/L (ref 39–117)
BUN / CREAT RATIO: 14 (ref 12–28)
BUN: 10 mg/dL (ref 8–27)
Bilirubin Total: 0.4 mg/dL (ref 0.0–1.2)
CALCIUM: 9.9 mg/dL (ref 8.7–10.3)
CO2: 24 mmol/L (ref 18–29)
CREATININE: 0.71 mg/dL (ref 0.57–1.00)
Chloride: 101 mmol/L (ref 96–106)
GFR, EST AFRICAN AMERICAN: 97 mL/min/{1.73_m2} (ref >59)
GFR, EST NON AFRICAN AMERICAN: 84 mL/min/{1.73_m2} (ref >59)
GLOBULIN, TOTAL: 2.8 g/dL (ref 1.5–4.5)
Glucose: 195 mg/dL — ABNORMAL HIGH (ref 65–99)
POTASSIUM: 3.8 mmol/L (ref 3.5–5.2)
SODIUM: 140 mmol/L (ref 134–144)
Total Protein: 6.7 g/dL (ref 6.0–8.5)

## 2016-10-03 LAB — TSH: TSH: 0.264 u[IU]/mL — AB (ref 0.450–4.500)

## 2016-10-03 LAB — CBC WITH DIFFERENTIAL/PLATELET
BASOS: 1 %
Basophils Absolute: 0 10*3/uL (ref 0.0–0.2)
EOS (ABSOLUTE): 0.1 10*3/uL (ref 0.0–0.4)
Eos: 2 %
HEMOGLOBIN: 12.3 g/dL (ref 11.1–15.9)
Hematocrit: 36.7 % (ref 34.0–46.6)
IMMATURE GRANULOCYTES: 0 %
Immature Grans (Abs): 0 10*3/uL (ref 0.0–0.1)
LYMPHS ABS: 1.7 10*3/uL (ref 0.7–3.1)
Lymphs: 37 %
MCH: 29.5 pg (ref 26.6–33.0)
MCHC: 33.5 g/dL (ref 31.5–35.7)
MCV: 88 fL (ref 79–97)
MONOCYTES: 5 %
MONOS ABS: 0.3 10*3/uL (ref 0.1–0.9)
NEUTROS PCT: 55 %
Neutrophils Absolute: 2.5 10*3/uL (ref 1.4–7.0)
Platelets: 255 10*3/uL (ref 150–379)
RBC: 4.17 x10E6/uL (ref 3.77–5.28)
RDW: 14.2 % (ref 12.3–15.4)
WBC: 4.7 10*3/uL (ref 3.4–10.8)

## 2016-10-03 LAB — HGB A1C W/O EAG: Hgb A1c MFr Bld: 8.4 % — ABNORMAL HIGH (ref 4.8–5.6)

## 2016-10-09 ENCOUNTER — Ambulatory Visit
Admission: RE | Admit: 2016-10-09 | Discharge: 2016-10-09 | Disposition: A | Discharge status: Home / Self Care | Payer: Medicare Other | Source: Ambulatory Visit | Attending: Internal Medicine | Admitting: Internal Medicine

## 2016-10-09 DIAGNOSIS — R5383 Other fatigue: Secondary | ICD-10-CM

## 2016-10-09 DIAGNOSIS — R05 Cough: Secondary | ICD-10-CM

## 2016-10-09 DIAGNOSIS — R059 Cough, unspecified: Secondary | ICD-10-CM

## 2016-10-13 ENCOUNTER — Ambulatory Visit: Payer: Medicare Other | Admitting: Internal Medicine

## 2016-10-15 ENCOUNTER — Ambulatory Visit (INDEPENDENT_AMBULATORY_CARE_PROVIDER_SITE_OTHER): Payer: Medicare Other | Admitting: Internal Medicine

## 2016-10-15 ENCOUNTER — Encounter: Payer: Self-pay | Admitting: Internal Medicine

## 2016-10-15 VITALS — BP 130/78 | HR 96 | Resp 12 | Ht 62.25 in | Wt 184.0 lb

## 2016-10-15 DIAGNOSIS — J3089 Other allergic rhinitis: Secondary | ICD-10-CM | POA: Diagnosis not present

## 2016-10-15 DIAGNOSIS — R748 Abnormal levels of other serum enzymes: Secondary | ICD-10-CM

## 2016-10-15 DIAGNOSIS — E119 Type 2 diabetes mellitus without complications: Secondary | ICD-10-CM

## 2016-10-15 LAB — GLUCOSE, POCT (MANUAL RESULT ENTRY): POC GLUCOSE: 188 mg/dL — AB (ref 70–99)

## 2016-10-15 MED ORDER — DICLOFENAC SODIUM 1 % TD GEL: 4.0000 g | Freq: Two times a day (BID) | TRANSDERMAL | 4 refills | Status: DC | PRN

## 2016-10-15 MED ORDER — GLIMEPIRIDE 2 MG PO TABS: ORAL_TABLET | ORAL | 3 refills | Status: DC

## 2016-10-15 NOTE — Progress Notes (Signed)
Subjective:    Patient ID: Joy Ward, female    DOB: 02-11-1941, 76 y.o.   MRN: [id number]  HPI   1.  Cough and drainage:  Reports her cough is much better.  Wears a mask when she goes outside and her cough and posterior pharyngeal drainage does not get so bad.   States her prednisone Rx, "got mixed up"  Not clear exactly what happened, but she states she did not get the entire treatment.   She feels the Delma Freeze is really helping her.   3 days ago, she really felt she was better.   Having some more drainage than 3 days ago She is not aware of moisture issues, mold, mildew or roaches.    2.  DM:  A1C was 8.4%, up from 7.7% in November.  Stopped Metformin 2 weeks ago as she feels it causes her to gain weight and had stopped herself.  Did not bring in sugars.  States the pharmacy told her there was a problem with her diabetic supplies and I need to call to clarify.  Not clear if test strips or something else.  3.  Elevated transaminases:  History of negative acute Hepatitis panel in 2011:  Hep A, B, and C.  Was not feeling well at the time with likely viral URI symptoms. ALT higher than AST.  No nausea, vomiting.  Had one episode of loose stool yesterday and for some reason, has a packet of a few Vancomycin capsules, so took one.  Discussed bacterial resistance when take short term antibiotic for something not requiring antibiotics as well. Patient with distant history of C. Diff enterocolitis.    Current Meds  Medication Sig  . amLODipine (NORVASC) 5 MG tablet Take 1 tablet (5 mg total) by mouth daily.  . betamethasone dipropionate (DIPROLENE) 0.05 % cream Apply topically 2 (two) times daily.  . bimatoprost (LUMIGAN) 0.01 % SOLN Place 1 drop into the left eye at bedtime.  . diclofenac sodium (VOLTAREN) 1 % GEL Apply 4 g topically 2 (two) times daily as needed (pain).   . fluticasone (FLONASE) 50 MCG/ACT nasal spray Place 2 sprays into both nostrils daily.  Marland Kitchen glimepiride (AMARYL) 2 MG  tablet Take 1 tablet (2 mg total) by mouth daily with breakfast. (Patient taking differently: Take 2 mg by mouth. 1 in the morning and 1/2 tablet in the evening)  . hydrochlorothiazide (HYDRODIURIL) 25 MG tablet 1 tab by mouth every morning.  Marland Kitchen HYDROcodone-acetaminophen (NORCO) 10-325 MG tablet Take 1 tablet by mouth every 6 (six) hours as needed (pain).   Marland Kitchen levothyroxine (SYNTHROID, LEVOTHROID) 88 MCG tablet Take 1 tablet (88 mcg total) by mouth daily before breakfast.  . losartan (COZAAR) 100 MG tablet Take 1 tablet (100 mg total) by mouth daily.  . Multiple Vitamin (MULTIVITAMIN WITH MINERALS) TABS tablet Take 1 tablet by mouth daily.  . Nutritional Supplements (ESTROVEN PO) Take 1 tablet by mouth daily.  . Omega-3 Fatty Acids (FISH OIL) 1000 MG CAPS Take 1,000 mg by mouth.   . Phenylephrine-APAP-Guaifenesin (TYLENOL SINUS SEVERE PO) Take by mouth. 2 tabs 2-3 times a day as needed  . polyethylene glycol (MIRALAX / GLYCOLAX) packet Take 17 g by mouth daily.  . potassium chloride SA (K-DUR,KLOR-CON) 20 MEQ tablet Take 1 tablet (20 mEq total) by mouth daily.  . ranitidine (ZANTAC) 300 MG tablet Take 1 tablet (300 mg total) by mouth daily.  . traMADol (ULTRAM) 50 MG tablet Take 50 mg by mouth every 6 (six)  hours as needed (pain).   Marland Kitchen zolpidem (AMBIEN) 5 MG tablet Take 5 mg by mouth at bedtime as needed for sleep.    Allergies  Allergen Reactions  . Clindamycin Hcl Other (See Comments)    REACTION: C. difficile colitis  . Zanaflex [Tizanidine Hcl] Anaphylaxis  . Metronidazole Hives  . Trazodone And Nefazodone Other (See Comments)    Sinus pains  . Statins Other (See Comments)    Side Effect: muscle pain Has tried Zocor, Crestor--she does not want to take a statin, period  . Ace Inhibitors Cough  . Adhesive [Tape] Itching, Rash and Other (See Comments)    Blistering of skin    Review of Systems     Objective:   Physical Exam  Looks well. HEENT:  PERRL, EOMI, TMs pearly gray,  Mild  cobbling posterior pharynx, nasal mucosa boggy with clear discharge. Neck:  Supple, No adenopathy Chest:  CTA CV:  RRR without murmur or rub. Radial pulses normal and equal Abd:  S, NT, No HSM or mass, + BS LE:  No edema        Assessment & Plan:  1.  URI vs. Allergies or combination thereof.  Improved with mild prednisone burst and taper.  To continue avoiding pollen and Allegra + Flonase.  2.  DM:  Increase glimepiride to 2 mg twice daily.  Stopped Metformin at patient's request 2 weeks ago.  3.  Elevated Liver enzymes:  History of negative Hepatitis A, B, and C in 2011.  Recheck to make sure not increasing.

## 2016-10-16 LAB — HEPATIC FUNCTION PANEL
ALBUMIN: 4.1 g/dL (ref 3.5–4.8)
ALK PHOS: 90 IU/L (ref 39–117)
ALT: 108 IU/L — ABNORMAL HIGH (ref 0–32)
AST: 45 IU/L — AB (ref 0–40)
Bilirubin Total: 0.3 mg/dL (ref 0.0–1.2)
Bilirubin, Direct: 0.09 mg/dL (ref 0.00–0.40)
Total Protein: 6.8 g/dL (ref 6.0–8.5)

## 2016-10-16 NOTE — Addendum Note (Signed)
Addended by: Marcelino Duster on: 10/16/2016 06:12 PM   Modules accepted: Orders

## 2016-10-17 ENCOUNTER — Encounter (HOSPITAL_COMMUNITY): Payer: Self-pay | Admitting: Emergency Medicine

## 2016-10-17 ENCOUNTER — Emergency Department (HOSPITAL_COMMUNITY)
Admission: EM | Admit: 2016-10-17 | Discharge: 2016-10-18 | Disposition: A | Discharge status: Home / Self Care | Payer: Medicare Other | Attending: Emergency Medicine | Admitting: Emergency Medicine

## 2016-10-17 DIAGNOSIS — E119 Type 2 diabetes mellitus without complications: Secondary | ICD-10-CM | POA: Diagnosis not present

## 2016-10-17 DIAGNOSIS — Z79899 Other long term (current) drug therapy: Secondary | ICD-10-CM | POA: Insufficient documentation

## 2016-10-17 DIAGNOSIS — I10 Essential (primary) hypertension: Secondary | ICD-10-CM | POA: Diagnosis not present

## 2016-10-17 DIAGNOSIS — R194 Change in bowel habit: Secondary | ICD-10-CM | POA: Diagnosis not present

## 2016-10-17 DIAGNOSIS — Z7982 Long term (current) use of aspirin: Secondary | ICD-10-CM | POA: Diagnosis not present

## 2016-10-17 DIAGNOSIS — E039 Hypothyroidism, unspecified: Secondary | ICD-10-CM | POA: Insufficient documentation

## 2016-10-17 DIAGNOSIS — Z7984 Long term (current) use of oral hypoglycemic drugs: Secondary | ICD-10-CM | POA: Insufficient documentation

## 2016-10-17 DIAGNOSIS — R195 Other fecal abnormalities: Secondary | ICD-10-CM

## 2016-10-17 DIAGNOSIS — Z8673 Personal history of transient ischemic attack (TIA), and cerebral infarction without residual deficits: Secondary | ICD-10-CM | POA: Diagnosis not present

## 2016-10-17 DIAGNOSIS — E876 Hypokalemia: Secondary | ICD-10-CM | POA: Diagnosis not present

## 2016-10-17 DIAGNOSIS — Z87891 Personal history of nicotine dependence: Secondary | ICD-10-CM | POA: Diagnosis not present

## 2016-10-17 LAB — C DIFFICILE QUICK SCREEN W PCR REFLEX
C DIFFICILE (CDIFF) INTERP: NOT DETECTED
C Diff antigen: NEGATIVE
C Diff toxin: NEGATIVE

## 2016-10-17 LAB — LIPASE, BLOOD: LIPASE: 38 U/L (ref 11–51)

## 2016-10-17 LAB — COMPREHENSIVE METABOLIC PANEL
ALT: 64 U/L — AB (ref 14–54)
AST: 33 U/L (ref 15–41)
Albumin: 4 g/dL (ref 3.5–5.0)
Alkaline Phosphatase: 73 U/L (ref 38–126)
Anion gap: 10 (ref 5–15)
BUN: 15 mg/dL (ref 6–20)
CHLORIDE: 102 mmol/L (ref 101–111)
CO2: 26 mmol/L (ref 22–32)
CREATININE: 0.66 mg/dL (ref 0.44–1.00)
Calcium: 10.2 mg/dL (ref 8.9–10.3)
GFR calc non Af Amer: >60 mL/min (ref >60)
Glucose, Bld: 153 mg/dL — ABNORMAL HIGH (ref 65–99)
POTASSIUM: 3.1 mmol/L — AB (ref 3.5–5.1)
SODIUM: 138 mmol/L (ref 135–145)
Total Bilirubin: 0.5 mg/dL (ref 0.3–1.2)
Total Protein: 7.3 g/dL (ref 6.5–8.1)

## 2016-10-17 LAB — URINALYSIS, ROUTINE W REFLEX MICROSCOPIC
Bilirubin Urine: NEGATIVE
GLUCOSE, UA: NEGATIVE mg/dL
HGB URINE DIPSTICK: NEGATIVE
Ketones, ur: NEGATIVE mg/dL
Leukocytes, UA: NEGATIVE
Nitrite: NEGATIVE
PH: 8 (ref 5.0–8.0)
PROTEIN: NEGATIVE mg/dL
Specific Gravity, Urine: 1.009 (ref 1.005–1.030)

## 2016-10-17 LAB — CBC
HEMATOCRIT: 37.1 % (ref 36.0–46.0)
Hemoglobin: 12.8 g/dL (ref 12.0–15.0)
MCH: 29.2 pg (ref 26.0–34.0)
MCHC: 34.5 g/dL (ref 30.0–36.0)
MCV: 84.7 fL (ref 78.0–100.0)
PLATELETS: 224 10*3/uL (ref 150–400)
RBC: 4.38 MIL/uL (ref 3.87–5.11)
RDW: 13.8 % (ref 11.5–15.5)
WBC: 6.6 10*3/uL (ref 4.0–10.5)

## 2016-10-17 MED ORDER — POTASSIUM CHLORIDE CRYS ER 20 MEQ PO TBCR
40.0000 meq | EXTENDED_RELEASE_TABLET | Freq: Once | ORAL | Status: AC
  Administered 2016-10-18: 40 meq via ORAL
  Filled 2016-10-17: qty 2

## 2016-10-17 NOTE — ED Notes (Addendum)
Pt presenting today c/o weakness and unusual yellowish coloration of stool for a couple weeks that has turned light brownish today. Denies abd pain. Hx of c-diff. Ambulatory in triage.

## 2016-10-17 NOTE — ED Triage Notes (Signed)
Pt reports having 3 bowel movements today that were loose.  States she has had c-diff in the past (over a year from now).  Reports the past few weeks having sinus congestion as well. Ambulatory and A&O x4.

## 2016-10-17 NOTE — ED Provider Notes (Signed)
[facility] DEPT Provider Note   CSN: [id number] Arrival date & time: 10/17/16  2110     History   Chief Complaint Chief Complaint  Patient presents with  . Diarrhea  . Nasal Congestion    HPI Aleeya Mccombs is a 76 y.o. female.  HPI Patient had 3 soft stools that she described as yellow in color and "stringy" today. She had C. difficile colitis 10 years ago. She is concerned she may have developed it again. She denies any diarrhea. No fever or chills. No abdominal pain. She is being treated for URI by her primary physician. Past Medical History:  Diagnosis Date  . Allergy   . Arthritis   . Diabetes mellitus without complication ([place])   . Former tobacco use   . Hyperlipidemia    a. H/o muscle aches with statins per PCP notes.  . Hypertension   . Hypothyroidism    a. prior thyroid removal.  . Stroke ([place])    a. Listed in PCP notes: "History of stroke:  History of terrible headache.  Had a CT scan of brain and found "ministroke"  Was removed from ERT subsequently. "    Patient Active Problem List   Diagnosis Date Noted  . Elevated liver enzymes 10/15/2016  . Chest pain 04/22/2016  . Systolic murmur 96/22/2979  . Hypercalcemia 04/22/2016  . Hyperlipidemia 04/22/2016  . Baker's cyst of knee, right 04/21/2016  . HYPOKALEMIA 01/24/2010  . CONSTIPATION 12/23/2009  . INTERMITTENT VERTIGO 12/23/2009  . SINUSITIS, ACUTE 04/25/2009  . VAGINITIS, CANDIDAL 02/14/2009  . HIP PAIN, RIGHT 02/14/2009  . HYPERLIPIDEMIA, MIXED 01/01/2009  . BACK PAIN, LUMBAR, WITH RADICULOPATHY 12/25/2008  . KNEE PAIN, BILATERAL 12/18/2008  . TROCHANTERIC BURSITIS, BILATERAL 12/18/2008  . SLEEP DISORDER, CHRONIC 12/18/2008  . ONYCHOMYCOSIS, TOENAILS 10/30/2008  . ENTERITIS, CLOSTRIDIUM DIFFICILE 10/15/2006  . Diabetes mellitus type 2, controlled, without complications ([place]) 89/21/1940  . Essential hypertension 09/18/2006  . Allergic rhinitis 09/18/2006  . GOITER, MULTINODULAR 06/29/2006    . STROKE 04/27/2001    Past Surgical History:  Procedure Laterality Date  . ABDOMINAL HYSTERECTOMY  1994   TAH, not clear if unilateral oophorectomy as well.  Marland Kitchen [facility]  . CHOLECYSTECTOMY  1973   open  . DECOMPRESSIVE LUMBAR LAMINECTOMY LEVEL 4  2005   In [place]  . THYROIDECTOMY  2012   multinodular goiter    OB History    No data available       Home Medications    Prior to Admission medications   Medication Sig Start Date End Date Taking? Authorizing Provider  amLODipine (NORVASC) 5 MG tablet Take 1 tablet (5 mg total) by mouth daily. 08/21/16  Yes Mack Hook, MD  betamethasone dipropionate (DIPROLENE) 0.05 % cream Apply topically 2 (two) times daily.   Yes [provider]  bimatoprost (LUMIGAN) 0.01 % SOLN Place 1 drop into the left eye at bedtime.   Yes [provider]  diclofenac sodium (VOLTAREN) 1 % GEL Apply 4 g topically 2 (two) times daily as needed (pain). 10/15/16  Yes Mack Hook, MD  fluticasone (FLONASE) 50 MCG/ACT nasal spray Place 2 sprays into both nostrils daily. 09/14/16  Yes Mack Hook, MD  glimepiride (AMARYL) 2 MG tablet 1 tab by mouth twice daily with meals 10/15/16  Yes Mack Hook, MD  hydrochlorothiazide (HYDRODIURIL) 25 MG tablet 1 tab by mouth every morning. 08/21/16  Yes Mack Hook, MD  HYDROcodone-acetaminophen [facility]) 10-325 MG tablet Take 1 tablet by mouth every  6 (six) hours as needed (pain).    Yes [provider]  levothyroxine (SYNTHROID, LEVOTHROID) 88 MCG tablet Take 1 tablet (88 mcg total) by mouth daily before breakfast. 08/21/16  Yes Mack Hook, MD  losartan (COZAAR) 100 MG tablet Take 1 tablet (100 mg total) by mouth daily. 08/21/16  Yes Mack Hook, MD  Multiple Vitamin (MULTIVITAMIN WITH MINERALS) TABS tablet Take 1 tablet by mouth daily.   Yes [provider]  Nutritional Supplements (ESTROVEN PO) Take 1 tablet by  mouth daily.   Yes [provider]  Omega-3 Fatty Acids (FISH OIL) 1000 MG CAPS Take 1,000 mg by mouth daily.    Yes [provider]  Phenylephrine-APAP-Guaifenesin (TYLENOL SINUS SEVERE PO) Take 2 tablets by mouth daily as needed (sinus and congestion).    Yes [provider]  potassium chloride SA (K-DUR,KLOR-CON) 20 MEQ tablet Take 1 tablet (20 mEq total) by mouth daily. 08/21/16  Yes Mack Hook, MD  ranitidine (ZANTAC) 300 MG tablet Take 1 tablet (300 mg total) by mouth daily. 08/21/16  Yes Mack Hook, MD  zolpidem (AMBIEN) 5 MG tablet Take 5 mg by mouth at bedtime as needed for sleep.   Yes [provider]  ACCU-CHEK SOFTCLIX LANCETS lancets Use as instructed Patient not taking: Reported on 10/02/2016 08/21/16   Mack Hook, MD  aspirin EC 81 MG EC tablet Take 1 tablet (81 mg total) by mouth daily. Patient not taking: Reported on 10/02/2016 04/24/16   Niel Hummer A, MD  Blood Glucose Monitoring Suppl (ACCU-CHEK AVIVA PLUS) w/Device KIT use as directed to [place] Patient not taking: Reported on 10/02/2016 08/14/16   Mack Hook, MD  fexofenadine-pseudoephedrine (ALLEGRA-D) 60-120 MG 12 hr tablet Take 1 tablet by mouth 2 (two) times daily. Patient not taking: Reported on 10/02/2016 09/14/16   Mack Hook, MD  furosemide (LASIX) 20 MG tablet 1 tab by mouth daily if swelling of legs.  Eat a banana too. Patient not taking: Reported on 10/02/2016 06/29/16   Mack Hook, MD  glucose blood (ACCU-CHEK AVIVA) test strip Check sugars twice daily before meals Patient not taking: Reported on 10/02/2016 08/21/16   Mack Hook, MD  Lancet Devices [facility]) lancets Use as instructed Patient not taking: Reported on 10/02/2016 08/21/16   Mack Hook, MD  metFORMIN (GLUCOPHAGE-XR) 500 MG 24 hr tablet Take 1 tablet (500 mg total) by mouth daily with breakfast. Patient not taking: Reported on 10/15/2016  08/21/16   Mack Hook, MD  polyethylene glycol (MIRALAX / Floria Raveling) packet Take 17 g by mouth daily. Patient taking differently: Take 17 g by mouth daily as needed for moderate constipation.  09/14/16   Mack Hook, MD  predniSONE (DELTASONE) 10 MG tablet 2 tabs by mouth daily for 3 days.  Day 4: 1.5 tabs, Day5: 1 tab, Day 6: 1/2 tab, then done Patient not taking: Reported on 10/15/2016 10/02/16   Mack Hook, MD  traMADol (ULTRAM) 50 MG tablet Take 50 mg by mouth every 6 (six) hours as needed (pain).     [provider]    Family History Family History  Problem Relation Age of Onset  . Heart disease Mother        unclear details "atherosclerosis"  . Diabetes Mother   . Alcohol abuse Sister   . Diabetes Brother   . Diabetes Brother   . Drug abuse Brother   . Diabetes Daughter   . Heart disease Son        CHF  .  Alcohol abuse Son     Social History Social History  Substance Use Topics  . Smoking status: Former Research scientist (life sciences)  . Smokeless tobacco: Never Used     Comment: No smoking since 76 yo - smoked 10 yrs  . Alcohol use No     Allergies   Clindamycin hcl; Zanaflex [tizanidine hcl]; Metronidazole; Trazodone and nefazodone; Statins; Ace inhibitors; and Adhesive [tape]   Review of Systems Review of Systems  Constitutional: Negative for chills, fatigue and fever.  HENT: Positive for congestion and sinus pressure. Negative for sore throat and trouble swallowing.   Eyes: Negative for photophobia and visual disturbance.  Respiratory: Negative for cough, chest tightness and shortness of breath.   Cardiovascular: Negative for chest pain, palpitations and leg swelling.  Gastrointestinal: Negative for abdominal pain, constipation, diarrhea, nausea and vomiting.  Musculoskeletal: Negative for back pain, myalgias, neck pain and neck stiffness.  Skin: Negative for rash and wound.  Neurological: Negative for dizziness, weakness, light-headedness, numbness and  headaches.  Psychiatric/Behavioral: Positive for dysphoric mood.  All other systems reviewed and are negative.    Physical Exam Updated Vital Signs BP (!) 147/77 (BP Location: Left Arm)   Pulse 83   Temp 98.8 F (37.1 C) (Oral)   Resp 16   Ht _0  (1.6 m)   Wt 83 kg (183 lb)   SpO2 100%   BMI 32.42 kg/m   Physical Exam  Constitutional: She is oriented to person, place, and time. She appears well-developed and well-nourished. No distress.  HENT:  Head: Normocephalic and atraumatic.  Mouth/Throat: Oropharynx is clear and moist.  Eyes: EOM are normal. Pupils are equal, round, and reactive to light.  Neck: Normal range of motion. Neck supple.  Cardiovascular: Normal rate and regular rhythm.  Exam reveals no gallop and no friction rub.   No murmur heard. Pulmonary/Chest: Effort normal and breath sounds normal. No respiratory distress. She has no wheezes. She has no rales. She exhibits no tenderness.  Abdominal: Soft. Bowel sounds are normal. There is no tenderness. There is no rebound and no guarding.  Musculoskeletal: Normal range of motion. She exhibits no edema or tenderness.  No CVA tenderness bilaterally.  Neurological: She is alert and oriented to person, place, and time.  Moving all extremities without deficit. Sensation fully intact.  Skin: Skin is warm and dry. Capillary refill takes less than 2 seconds. No rash noted. No erythema.  Psychiatric: Her behavior is normal.  Emotionally labile. Crying at times.  Nursing note and vitals reviewed.    ED Treatments / Results  Labs (all labs ordered are listed, but only abnormal results are displayed) Labs Reviewed  COMPREHENSIVE METABOLIC PANEL - Abnormal; Notable for the following:       Result Value   Potassium 3.1 (*)    Glucose, Bld 153 (*)    ALT 64 (*)    All other components within normal limits  URINALYSIS, ROUTINE W REFLEX MICROSCOPIC - Abnormal; Notable for the following:    APPearance HAZY (*)    All other  components within normal limits  C DIFFICILE QUICK SCREEN W PCR REFLEX  LIPASE, BLOOD  CBC    EKG  EKG Interpretation None       Radiology No results found.  Procedures Procedures (including critical care time)  Medications Ordered in ED Medications  potassium chloride SA (K-DUR,KLOR-CON) CR tablet 40 mEq (not administered)     Initial Impression / Assessment and Plan / ED Course  I have reviewed the triage  vital signs and the nursing notes.  Pertinent labs & imaging results that were available during my care of the patient were reviewed by me and considered in my medical decision making (see chart for details).     Patient continues to be very well-appearing in the emergency department. Abdominal exam is benign. Normal white blood cell count. Patient does have some hypokalemia. Given oral potassium replacement. Stool sample given and will send for C. difficile PCR. Patient is advised to follow-up closely with her primary physician. Return precautions given.  Final Clinical Impressions(s) / ED Diagnoses   Final diagnoses:  Hypokalemia  Change in consistency of stool    New Prescriptions New Prescriptions   No medications on file     Julianne Rice, MD 10/17/16 2347

## 2016-10-18 DIAGNOSIS — R194 Change in bowel habit: Secondary | ICD-10-CM | POA: Diagnosis not present

## 2016-11-05 ENCOUNTER — Ambulatory Visit
Admission: RE | Admit: 2016-11-05 | Discharge: 2016-11-05 | Disposition: A | Discharge status: Home / Self Care | Payer: Medicare Other | Source: Ambulatory Visit | Attending: Internal Medicine | Admitting: Internal Medicine

## 2016-11-05 DIAGNOSIS — R748 Abnormal levels of other serum enzymes: Secondary | ICD-10-CM

## 2016-11-05 DIAGNOSIS — R945 Abnormal results of liver function studies: Secondary | ICD-10-CM | POA: Diagnosis not present

## 2016-11-12 LAB — SPECIMEN STATUS REPORT

## 2016-11-12 LAB — HEPATITIS B SURFACE ANTIGEN: Hepatitis B Surface Ag: NEGATIVE

## 2016-11-12 LAB — HEPATITIS C ANTIBODY

## 2016-11-12 LAB — HEPATITIS A ANTIBODY, TOTAL: HEP A TOTAL AB: NEGATIVE

## 2016-11-12 LAB — HEPATITIS B CORE ANTIBODY, TOTAL: Hep B Core Total Ab: NEGATIVE

## 2016-11-17 ENCOUNTER — Telehealth: Payer: Self-pay | Admitting: Internal Medicine

## 2016-11-17 NOTE — Telephone Encounter (Signed)
Patient would like a refill of zolpidem(AMBIEN 5 mg tablet)  Patient was receiving from another physician and stated pharmacist said the her current doctor would need to prescribe.  Patient would like Rx called into [facility] on [facility].

## 2016-11-19 NOTE — Telephone Encounter (Signed)
Spoke with patient. States she fell last night going to the bathroom and hurt her knee and ankle. Per Dr. Amil Amen she should be seen at urgent care just in case they need to do X-Rays. Patient verbalized understanding. Patient also requesting Rx for Ambien 5 mg. States another physician was prescribing this for her. Per Dr. Amil Amen she needs to be evaluated first especially giving patient was getting out of bed when she fell. Patient informed needs to be evaluated first.. Patient understood

## 2016-12-18 ENCOUNTER — Encounter: Payer: Self-pay | Admitting: Internal Medicine

## 2016-12-18 ENCOUNTER — Ambulatory Visit (INDEPENDENT_AMBULATORY_CARE_PROVIDER_SITE_OTHER): Payer: Medicare Other | Admitting: Internal Medicine

## 2016-12-18 VITALS — BP 130/84 | HR 82 | Resp 12 | Ht 62.25 in | Wt 182.0 lb

## 2016-12-18 DIAGNOSIS — R748 Abnormal levels of other serum enzymes: Secondary | ICD-10-CM | POA: Diagnosis not present

## 2016-12-18 DIAGNOSIS — E876 Hypokalemia: Secondary | ICD-10-CM

## 2016-12-18 DIAGNOSIS — E119 Type 2 diabetes mellitus without complications: Secondary | ICD-10-CM | POA: Diagnosis not present

## 2016-12-18 DIAGNOSIS — M25561 Pain in right knee: Secondary | ICD-10-CM | POA: Diagnosis not present

## 2016-12-18 DIAGNOSIS — J3089 Other allergic rhinitis: Secondary | ICD-10-CM

## 2016-12-18 DIAGNOSIS — R413 Other amnesia: Secondary | ICD-10-CM

## 2016-12-18 DIAGNOSIS — M25562 Pain in left knee: Secondary | ICD-10-CM

## 2016-12-18 DIAGNOSIS — I10 Essential (primary) hypertension: Secondary | ICD-10-CM

## 2016-12-18 LAB — GLUCOSE, POCT (MANUAL RESULT ENTRY): POC GLUCOSE: 193 mg/dL — AB (ref 70–99)

## 2016-12-18 NOTE — Progress Notes (Signed)
   Subjective:    Patient ID: Joy Ward, female    DOB: August 12, 1940, 76 y.o.   MRN: [id number]  HPI  1.  Concerns for memory:  Have evaluated with MMSE with a good score, but patient adamant she has problems with her memory.  Have discussed her medications as well as the possibility of isolation and depression as factors causing her sense of memory problems. Discussed today the possibility of having a neuropsychiatric evaluation, which she is agreeable to.  2.  DM:  Not checking sugars regularly.  Highest in the morning before breakfast never above 145.  Cannot tell me her sugars from the few times she has checked in the afternoon or evening.   Not physically active.  Occasionally, goes for a walk.   Having a lot of difficulties with allergies when goes outside.  Plus has arthritis in knees and legs Would be willing to go to [employer], but car broken down.  Has not looked into [facility].  Discussed walking laps in pool.  Does feel she can work on her diet.    3.  Essential hypertension:  Taking meds.  4.  Risk for CAD:  Not taking ASA.   Encouraged her to take this with food  5.  LE pain:  Voltaren cream:  Helps.  Not clear if this controls her pain adequately.  Was wondering if she could get some physical therapy.  Review of Systems     Objective:   Physical Exam  Wearing her mask to avoid allergy symptoms HEENT:  PERRL, EOMI.  Left eye mildy injected with darkening of skin about eye. TMs pearly gray, throat without injection Neck: Supple, No adenopathy Chest:  CTA CV:  RRR without murmur or rub.  Radial and DP pulses normal and equal LE: trace edema.        Assessment & Plan:  1.  Concerns for memory loss:  Referral for Neuropsychiatric evaluation.  Call into Dr. Ferne Coe office.  Again, seems to be very focused on her memory.  Seems also to be isolating herself due to all of her health concerns.  Unable to get her to really consider counseling--states she is interested,  but did not follow through with Macie Burows, LCSW.    2.  Leg complaints/arthralgias: [facility]  [facility] for [facility].  Can joins silver sneakers or scholarship. Does not want gabapentin--suicidal thoughts in past with this.  3. HM:  Will get her own flu vaccine  4.  Allergies: OTC Allegra and Fluticasone nasal  5.  Recent hypokalemia/elevated liver enzymes:  CMP  6.  DM/Hyperlipidemia: discussed health diet and physical activity.  To work on this and recheck A1C, FLP in 3 months and follow up with me days later.

## 2016-12-18 NOTE — Patient Instructions (Signed)
Please [facility] for the [facility]. Look into [facility] to get to [facility]. Work on diet and physical activity to improve diabetes--see diet below We will call next week about Neuropsychiatric referral for memory Take your baby aspirin with breakfast daily.  Drink a glass of water before every meal Drink 6-8 glasses of water daily Eat three meals daily Eat a protein and healthy fat with every meal (eggs,fish, chicken, [place] and limit red meats) Eat 5 servings of vegetables daily, mix the colors Eat 2 servings of fruit daily with skin, if skin is edible Use smaller plates Put food/utensils down as you chew and swallow each bite Eat at a table with friends/family at least once daily, no TV Do not eat in front of the TV

## 2016-12-19 LAB — COMPREHENSIVE METABOLIC PANEL
ALK PHOS: 72 IU/L (ref 39–117)
ALT: 27 IU/L (ref 0–32)
AST: 24 IU/L (ref 0–40)
Albumin/Globulin Ratio: 1.5 (ref 1.2–2.2)
Albumin: 4.3 g/dL (ref 3.5–4.8)
BUN/Creatinine Ratio: 19 (ref 12–28)
BUN: 13 mg/dL (ref 8–27)
Bilirubin Total: 0.4 mg/dL (ref 0.0–1.2)
CALCIUM: 10.1 mg/dL (ref 8.7–10.3)
CO2: 25 mmol/L (ref 20–29)
CREATININE: 0.67 mg/dL (ref 0.57–1.00)
Chloride: 100 mmol/L (ref 96–106)
GFR calc Af Amer: 99 mL/min/{1.73_m2} (ref >59)
GFR, EST NON AFRICAN AMERICAN: 86 mL/min/{1.73_m2} (ref >59)
GLUCOSE: 186 mg/dL — AB (ref 65–99)
Globulin, Total: 2.8 g/dL (ref 1.5–4.5)
Potassium: 3.8 mmol/L (ref 3.5–5.2)
Sodium: 141 mmol/L (ref 134–144)
Total Protein: 7.1 g/dL (ref 6.0–8.5)

## 2016-12-30 DIAGNOSIS — M961 Postlaminectomy syndrome, not elsewhere classified: Secondary | ICD-10-CM | POA: Diagnosis not present

## 2017-01-06 ENCOUNTER — Ambulatory Visit (INDEPENDENT_AMBULATORY_CARE_PROVIDER_SITE_OTHER): Payer: Medicare Other | Admitting: Internal Medicine

## 2017-01-06 ENCOUNTER — Encounter: Payer: Self-pay | Admitting: Internal Medicine

## 2017-01-06 VITALS — BP 128/80 | HR 88 | Resp 14 | Ht 62.25 in | Wt 185.0 lb

## 2017-01-06 DIAGNOSIS — M722 Plantar fascial fibromatosis: Secondary | ICD-10-CM

## 2017-01-06 DIAGNOSIS — E119 Type 2 diabetes mellitus without complications: Secondary | ICD-10-CM

## 2017-01-06 LAB — GLUCOSE, POCT (MANUAL RESULT ENTRY): POC Glucose: 209 mg/dl — AB (ref 70–99)

## 2017-01-06 MED ORDER — DICLOFENAC SODIUM 1 % TD GEL: 4.0000 g | Freq: Two times a day (BID) | TRANSDERMAL | 11 refills | Status: DC | PRN

## 2017-01-06 MED ORDER — SPENCO GEL HEEL CUP MED/LG MISC: 0 refills | Status: DC

## 2017-01-06 NOTE — Progress Notes (Signed)
Subjective:    Patient ID: Joy Ward, female    DOB: 1940-11-05, 76 y.o.   MRN: [id number]  HPI   Plantar left heel pain starting yesterday evening.  Pain radiates mainly up back of leg to low calf.   Does go barefoot in house--has wood floors.   Wears mules that either have a tiny heel or flats.   Has not been on her feet more than usual.  Has not been jumping up and down in her home. Has not taken any medication for this.   This morning, very painful when first out of bed and as she walked, gradually improved somewhat.    Current Meds  Medication Sig  . ACCU-CHEK SOFTCLIX LANCETS lancets Use as instructed  . amLODipine (NORVASC) 5 MG tablet Take 1 tablet (5 mg total) by mouth daily.  Marland Kitchen aspirin EC 81 MG EC tablet Take 1 tablet (81 mg total) by mouth daily.  . betamethasone dipropionate (DIPROLENE) 0.05 % cream Apply topically 2 (two) times daily.  . bimatoprost (LUMIGAN) 0.01 % SOLN Place 1 drop into the left eye at bedtime.  . Blood Glucose Monitoring Suppl (ACCU-CHEK AVIVA PLUS) w/Device KIT use as directed to CHECK BLOOD SUGAR  . diclofenac sodium (VOLTAREN) 1 % GEL Apply 4 g topically 2 (two) times daily as needed (pain).  . fluticasone (FLONASE) 50 MCG/ACT nasal spray Place 2 sprays into both nostrils daily.  Marland Kitchen glimepiride (AMARYL) 2 MG tablet 1 tab by mouth twice daily with meals  . glucose blood (ACCU-CHEK AVIVA) test strip Check sugars twice daily before meals  . hydrochlorothiazide (HYDRODIURIL) 25 MG tablet 1 tab by mouth every morning.  Marland Kitchen HYDROcodone-acetaminophen (NORCO) 10-325 MG tablet Take 1 tablet by mouth every 6 (six) hours as needed (pain).   Elmore Guise Devices (ACCU-CHEK SOFTCLIX) lancets Use as instructed  . levothyroxine (SYNTHROID, LEVOTHROID) 88 MCG tablet Take 1 tablet (88 mcg total) by mouth daily before breakfast.  . losartan (COZAAR) 100 MG tablet Take 1 tablet (100 mg total) by mouth daily.  . Multiple Vitamin (MULTIVITAMIN WITH MINERALS) TABS tablet  Take 1 tablet by mouth daily.  . Nutritional Supplements (ESTROVEN PO) Take 1 tablet by mouth daily.  . Omega-3 Fatty Acids (FISH OIL) 1000 MG CAPS Take 1,000 mg by mouth 2 (two) times daily.   Marland Kitchen Phenylephrine-APAP-Guaifenesin (TYLENOL SINUS SEVERE PO) Take 2 tablets by mouth daily as needed (sinus and congestion).   . polyethylene glycol (MIRALAX / GLYCOLAX) packet Take 17 g by mouth daily. (Patient taking differently: Take 17 g by mouth daily as needed for moderate constipation. )  . potassium chloride SA (K-DUR,KLOR-CON) 20 MEQ tablet Take 1 tablet (20 mEq total) by mouth daily.  . ranitidine (ZANTAC) 300 MG tablet Take 1 tablet (300 mg total) by mouth daily.  . traMADol (ULTRAM) 50 MG tablet Take 50 mg by mouth every 6 (six) hours as needed (pain).   Marland Kitchen zolpidem (AMBIEN) 5 MG tablet Take 5 mg by mouth at bedtime as needed for sleep.   Allergies  Allergen Reactions  . Clindamycin Hcl Other (See Comments)    REACTION: C. difficile colitis  . Zanaflex [Tizanidine Hcl] Anaphylaxis  . Metronidazole Hives  . Trazodone And Nefazodone Other (See Comments)    Sinus pains  . Statins Other (See Comments)    Side Effect: muscle pain Has tried Zocor, Crestor--she does not want to take a statin, period  . Ace Inhibitors Cough  . Adhesive [Tape] Itching, Rash and Other (See  Comments)    Blistering of skin        Review of Systems     Objective:   Physical Exam   NAD LE:  No edema Right foot:  No swelling or erythema.  Mild tenderness over achilles.  Point tenderness over plantar aspect of calcaneus that reproduces pain.  No tenderness in arch or out to ball of foot.         Assessment & Plan:  Right Plantar Fasciitis:  Heel cups.  Avoid mules, avoid walking in bare or stockinged feet.   Discussed stretches before bearing weight No mules No flip flops Heel cups. Use Diclofenac gel to area twice daily as needed Has issues with balance getting in and out of tub shower--wrote for  a shower bar to support her getting in and out more safely.

## 2017-01-06 NOTE — Patient Instructions (Addendum)
No bare feet or stockinged feet. No mules or flip flops Wear good cushioned slippers or shoes with good arch support when walking around in home or anywhere Heel cups for both shoes Stretch out feet before getting out of bed in morning and when sitting and watching TV or reading  10 minutes twice daily Roll frozen bottle of water with bare foot on the ground while watching TV as well--10 minutes twice daily

## 2017-01-14 DIAGNOSIS — M5136 Other intervertebral disc degeneration, lumbar region: Secondary | ICD-10-CM | POA: Diagnosis not present

## 2017-01-14 DIAGNOSIS — M961 Postlaminectomy syndrome, not elsewhere classified: Secondary | ICD-10-CM | POA: Diagnosis not present

## 2017-01-18 DIAGNOSIS — Z23 Encounter for immunization: Secondary | ICD-10-CM | POA: Diagnosis not present

## 2017-01-21 DIAGNOSIS — M25571 Pain in right ankle and joints of right foot: Secondary | ICD-10-CM | POA: Diagnosis not present

## 2017-01-21 DIAGNOSIS — M722 Plantar fascial fibromatosis: Secondary | ICD-10-CM | POA: Diagnosis not present

## 2017-02-08 DIAGNOSIS — H401232 Low-tension glaucoma, bilateral, moderate stage: Secondary | ICD-10-CM | POA: Diagnosis not present

## 2017-02-08 DIAGNOSIS — E119 Type 2 diabetes mellitus without complications: Secondary | ICD-10-CM | POA: Diagnosis not present

## 2017-02-08 DIAGNOSIS — H2513 Age-related nuclear cataract, bilateral: Secondary | ICD-10-CM | POA: Diagnosis not present

## 2017-02-09 ENCOUNTER — Other Ambulatory Visit: Payer: Self-pay | Admitting: Internal Medicine

## 2017-02-09 DIAGNOSIS — Z1231 Encounter for screening mammogram for malignant neoplasm of breast: Secondary | ICD-10-CM

## 2017-02-10 ENCOUNTER — Telehealth: Payer: Self-pay

## 2017-02-10 NOTE — Telephone Encounter (Signed)
Patient called in stating she bumped her head on the refrigerator last night at 2 am. States she is fine except she has a little swollen spot on her head. Patient denies in  Nausea, headache, dizziness or loss of consciousness. Patient states she did put ice on it. Patient states her family told her to call the doctor. Patient wants to know if she should go to ER.  Per Dr. Amil Amen have patient elevate head at night and prop herself up and to but ice on head on and off and to call back if anything changes or if she feels like symptoms are getting worse. Patient informed and verbalized understanding

## 2017-02-17 DIAGNOSIS — M722 Plantar fascial fibromatosis: Secondary | ICD-10-CM | POA: Diagnosis not present

## 2017-02-23 DIAGNOSIS — M7062 Trochanteric bursitis, left hip: Secondary | ICD-10-CM | POA: Diagnosis not present

## 2017-02-23 DIAGNOSIS — M7061 Trochanteric bursitis, right hip: Secondary | ICD-10-CM | POA: Diagnosis not present

## 2017-02-26 ENCOUNTER — Ambulatory Visit: Payer: Medicare Other

## 2017-03-23 ENCOUNTER — Other Ambulatory Visit (INDEPENDENT_AMBULATORY_CARE_PROVIDER_SITE_OTHER): Payer: Medicare Other

## 2017-03-23 DIAGNOSIS — E119 Type 2 diabetes mellitus without complications: Secondary | ICD-10-CM | POA: Diagnosis not present

## 2017-03-23 DIAGNOSIS — E782 Mixed hyperlipidemia: Secondary | ICD-10-CM

## 2017-03-24 LAB — HGB A1C W/O EAG: HEMOGLOBIN A1C: 8.3 % — AB (ref 4.8–5.6)

## 2017-03-24 LAB — LIPID PANEL W/O CHOL/HDL RATIO
Cholesterol, Total: 281 mg/dL — ABNORMAL HIGH (ref 100–199)
HDL: 94 mg/dL (ref >39)
LDL CALC: 159 mg/dL — AB (ref 0–99)
TRIGLYCERIDES: 138 mg/dL (ref 0–149)
VLDL CHOLESTEROL CAL: 28 mg/dL (ref 5–40)

## 2017-03-25 ENCOUNTER — Ambulatory Visit
Admission: RE | Admit: 2017-03-25 | Discharge: 2017-03-25 | Disposition: A | Discharge status: Home / Self Care | Payer: Medicare Other | Source: Ambulatory Visit | Attending: Internal Medicine | Admitting: Internal Medicine

## 2017-03-25 DIAGNOSIS — Z1231 Encounter for screening mammogram for malignant neoplasm of breast: Secondary | ICD-10-CM | POA: Diagnosis not present

## 2017-03-26 ENCOUNTER — Ambulatory Visit (INDEPENDENT_AMBULATORY_CARE_PROVIDER_SITE_OTHER): Payer: Medicare Other | Admitting: Internal Medicine

## 2017-03-26 ENCOUNTER — Encounter: Payer: Self-pay | Admitting: Internal Medicine

## 2017-03-26 VITALS — BP 140/80 | HR 80 | Resp 12 | Ht 62.25 in | Wt 187.0 lb

## 2017-03-26 DIAGNOSIS — E782 Mixed hyperlipidemia: Secondary | ICD-10-CM

## 2017-03-26 DIAGNOSIS — G479 Sleep disorder, unspecified: Secondary | ICD-10-CM | POA: Diagnosis not present

## 2017-03-26 DIAGNOSIS — E1165 Type 2 diabetes mellitus with hyperglycemia: Secondary | ICD-10-CM

## 2017-03-26 DIAGNOSIS — R413 Other amnesia: Secondary | ICD-10-CM

## 2017-03-26 DIAGNOSIS — R609 Edema, unspecified: Secondary | ICD-10-CM

## 2017-03-26 DIAGNOSIS — I10 Essential (primary) hypertension: Secondary | ICD-10-CM | POA: Diagnosis not present

## 2017-03-26 LAB — GLUCOSE, POCT (MANUAL RESULT ENTRY): POC GLUCOSE: 215 mg/dL — AB (ref 70–99)

## 2017-03-26 MED ORDER — METFORMIN HCL ER 500 MG PO TB24
500.0000 mg | ORAL_TABLET | Freq: Every day | ORAL | 11 refills | Status: DC
Start: 2017-03-26 — End: 2017-09-08

## 2017-03-26 NOTE — Patient Instructions (Addendum)
For Sleep:    Go to bed at the same time at night and get up at the same time in the morning Make sure you have 8 hours of sleep time available in between No sleeping other times of day  No caffeinated foods or beverages after lunch. Get outside every day for 20-30 minutes if weather permits Be physically active every day--gradually increase this to an hour total (maybe in 30 minute increments)  I don't care if it takes you 2 years to get to this length of time. If you can exercise about 6 hours prior to your bedtime, this will help you fall asleep at bedtime.  If that doesn't work out with your schedule, exercise when you can. If you have not fallen asleep in 20 minutes of going to bed or after having awakened, get out of bed and go read in a calm, quiet spot. Get out with new people and enjoy life that way  You will need a Prevnar 13 at the pharmacy--please check when you had your Pneumovax 23 when you lived in [place].  Purchase Tea Tree oil and mix with [place] Foot cream to apply to feet twice daily--massage into feet.

## 2017-03-26 NOTE — Progress Notes (Signed)
Subjective:    Patient ID: Joy Ward, female    DOB: [birth year], 76 y.o.   MRN: [id number]  HPI   1.  Concern for memory/depression/isolation: She has apparently heard from Neuropsych's office, but has not been able to get back with them.  States she gave up or forgot after a while.  2.  DM:  Would be willing to try Metformin. Discussed A1C is up to 8.3% at this point.  Has been unwilling to consider Metformin as she believes it causes her to gain weight.   3.  Sleep Disturbance:  Often sleeps until midafternoon as cannot fall asleep.  Was taking Ambien 10 mg at bedtime.  Makes her depressed if she tried to read and cannot remember what she has read.  4.  Legs hurt--points to pretibial area bilaterally. Has pitting edema.  She sits much of day with her legs dependent.  Not sure if legs better in the morning after sleep and legs more elevated.  5.  Hyperlipidemia: Discussed cholesterol not at goal by any means, though she does have a very high HDL.  Her LDL goal should be more than half of what it currently is. We have had this discussion multiple times before.  She is unwilling to consider a statin as they have caused muscle pain in the past.  Discussion that her elevated LDL increases her risk for recurrent stroke or MI.  She states she is okay with that.  Does not want the muscle pain. Lipid Panel     Component Value Date/Time   CHOL 281 (H) 03/23/2017 1420   TRIG 138 03/23/2017 1420   HDL 94 03/23/2017 1420   CHOLHDL 3.1 04/23/2016 0147   VLDL 25 04/23/2016 0147   LDLCALC 159 (H) 03/23/2017 1420   Current Meds  Medication Sig  . ACCU-CHEK SOFTCLIX LANCETS lancets Use as instructed  . amLODipine (NORVASC) 5 MG tablet Take 1 tablet (5 mg total) by mouth daily.  . betamethasone dipropionate (DIPROLENE) 0.05 % cream Apply topically 2 (two) times daily.  . bimatoprost (LUMIGAN) 0.01 % SOLN Place 1 drop into the left eye at bedtime.  . Blood Glucose Monitoring Suppl (ACCU-CHEK  AVIVA PLUS) w/Device KIT use as directed to CHECK BLOOD SUGAR  . diclofenac sodium (VOLTAREN) 1 % GEL Apply 4 g topically 2 (two) times daily as needed (pain).  . fluticasone (FLONASE) 50 MCG/ACT nasal spray Place 2 sprays into both nostrils daily.  . Foot Care Products [facility] GEL HEEL CUP MED/LG) MISC Wear in shoes all the time  . glimepiride (AMARYL) 2 MG tablet 1 tab by mouth twice daily with meals  . glucose blood (ACCU-CHEK AVIVA) test strip Check sugars twice daily before meals  . hydrochlorothiazide (HYDRODIURIL) 25 MG tablet 1 tab by mouth every morning.  Marland Kitchen HYDROcodone-acetaminophen (NORCO) 10-325 MG tablet Take 1 tablet by mouth every 6 (six) hours as needed (pain).   Elmore Guise Devices (ACCU-CHEK SOFTCLIX) lancets Use as instructed  . levothyroxine (SYNTHROID, LEVOTHROID) 88 MCG tablet Take 1 tablet (88 mcg total) by mouth daily before breakfast.  . losartan (COZAAR) 100 MG tablet Take 1 tablet (100 mg total) by mouth daily.  . Multiple Vitamin (MULTIVITAMIN WITH MINERALS) TABS tablet Take 1 tablet by mouth daily.  . Nutritional Supplements (ESTROVEN PO) Take 1 tablet by mouth daily.  . Omega-3 Fatty Acids (FISH OIL) 1000 MG CAPS Take 1,000 mg by mouth 2 (two) times daily.   Marland Kitchen Phenylephrine-APAP-Guaifenesin (TYLENOL SINUS SEVERE PO) Take 2 tablets  by mouth daily as needed (sinus and congestion).   . potassium chloride SA (K-DUR,KLOR-CON) 20 MEQ tablet Take 1 tablet (20 mEq total) by mouth daily.  . ranitidine (ZANTAC) 300 MG tablet Take 1 tablet (300 mg total) by mouth daily.  . traMADol (ULTRAM) 50 MG tablet Take 50 mg by mouth every 6 (six) hours as needed (pain).   Marland Kitchen zolpidem (AMBIEN) 5 MG tablet Take 5 mg by mouth at bedtime as needed for sleep.    Allergies  Allergen Reactions  . Clindamycin Hcl Other (See Comments)    REACTION: C. difficile colitis  . Zanaflex [Tizanidine Hcl] Anaphylaxis  . Metronidazole Hives  . Trazodone And Nefazodone Other (See Comments)    Sinus pains   . Statins Other (See Comments)    Side Effect: muscle pain Has tried Zocor, Crestor--she does not want to take a statin, period  . Ace Inhibitors Cough  . Adhesive [Tape] Itching, Rash and Other (See Comments)    Blistering of skin    Review of Systems     Objective:   Physical Exam   NAD HEENT:  PERRL, EOMI Neck:  Supple, No adenopathy Chest:  CTA CV:  RRR with normal S1 and S2, No S3, S4 or murmur.  Radial and DP pulses normal and equal. Abd:  S, NT, No HSM or mass, + BS LE:  Mild edema of feet and distal ankles.  No pitting.        Assessment & Plan:  1.  Concerns with memory:  Trying to get her into Neuropsych evaluation.  Not clear how much of her difficulty is truly memory loss.  MMSE previously was actually relatively normal.  Have discussed the possibility of depression/isolation with her, but she does not seem to feel this is a big problem for her, despite her description to the contrary. Will check with Neuropsych as to what has happened getting her in.  2.  DM:  Encouraged to eat in a healthy way and find a way to be physically active.  Discussed silver slippers with [facility], particularly getting into water. She agrees to start Metformin XR 500 mg daily.  Will move slowly with her dose with her concerns.    3.  Hyperlipidemia:  Content with decision to not take Statin.    4.  Insomnia:  Poor sleep hygiene.  Went over this again today at length.  See patient instructions.  5.  Leg swelling:  Encouraged elevating legs in a recumbent position during the day.  To check out sodium in prepared foods and limit addition to foods she prepares.  6.  Hypertension :  Up a bit today, but has been controlled  Follow up with CPE

## 2017-03-27 LAB — MICROALBUMIN / CREATININE URINE RATIO: Creatinine, Urine: 20.8 mg/dL

## 2017-03-29 ENCOUNTER — Telehealth: Payer: Self-pay

## 2017-03-29 ENCOUNTER — Encounter: Payer: Self-pay | Admitting: Psychology

## 2017-03-29 NOTE — Telephone Encounter (Signed)
Patient states she would like to get a referral for a endocrinologist. Patient states she has not seen a doctor for this in years and she does not have a thyroid.  To Dr. Amil Amen for further direction.

## 2017-04-05 NOTE — Telephone Encounter (Signed)
Is there a specific reason for requesting?   Her thyroid issues are something primary care physicians deal with all the time.  It's not a complicated issue requiring an endocrinologist.

## 2017-04-12 ENCOUNTER — Other Ambulatory Visit: Payer: Self-pay

## 2017-04-12 MED ORDER — GLUCOSE BLOOD VI STRP: ORAL_STRIP | 3 refills | Status: DC

## 2017-04-12 NOTE — Telephone Encounter (Signed)
Spoke with patient. Informed Dr. Amil Amen is managing thyroid and in normal range so no need to see endocrinologist. Patient verbalized understanding and also sent tin refill for patient test strips.

## 2017-04-13 DIAGNOSIS — M7062 Trochanteric bursitis, left hip: Secondary | ICD-10-CM | POA: Diagnosis not present

## 2017-04-13 DIAGNOSIS — M7061 Trochanteric bursitis, right hip: Secondary | ICD-10-CM | POA: Diagnosis not present

## 2017-04-15 ENCOUNTER — Encounter: Payer: Self-pay | Admitting: Internal Medicine

## 2017-04-15 DIAGNOSIS — M5136 Other intervertebral disc degeneration, lumbar region: Secondary | ICD-10-CM | POA: Diagnosis not present

## 2017-04-15 DIAGNOSIS — G894 Chronic pain syndrome: Secondary | ICD-10-CM | POA: Diagnosis not present

## 2017-04-15 DIAGNOSIS — M961 Postlaminectomy syndrome, not elsewhere classified: Secondary | ICD-10-CM | POA: Diagnosis not present

## 2017-05-03 DIAGNOSIS — E038 Other specified hypothyroidism: Secondary | ICD-10-CM | POA: Diagnosis not present

## 2017-05-03 DIAGNOSIS — G8929 Other chronic pain: Secondary | ICD-10-CM | POA: Diagnosis not present

## 2017-05-03 DIAGNOSIS — R1113 Vomiting of fecal matter: Secondary | ICD-10-CM | POA: Diagnosis not present

## 2017-05-03 DIAGNOSIS — R413 Other amnesia: Secondary | ICD-10-CM | POA: Diagnosis not present

## 2017-05-03 DIAGNOSIS — E0800 Diabetes mellitus due to underlying condition with hyperosmolarity without nonketotic hyperglycemic-hyperosmolar coma (NKHHC): Secondary | ICD-10-CM | POA: Diagnosis not present

## 2017-05-03 DIAGNOSIS — I10 Essential (primary) hypertension: Secondary | ICD-10-CM | POA: Diagnosis not present

## 2017-05-13 DIAGNOSIS — M5136 Other intervertebral disc degeneration, lumbar region: Secondary | ICD-10-CM | POA: Diagnosis not present

## 2017-05-13 DIAGNOSIS — M961 Postlaminectomy syndrome, not elsewhere classified: Secondary | ICD-10-CM | POA: Diagnosis not present

## 2017-05-18 DIAGNOSIS — E0800 Diabetes mellitus due to underlying condition with hyperosmolarity without nonketotic hyperglycemic-hyperosmolar coma (NKHHC): Secondary | ICD-10-CM | POA: Diagnosis not present

## 2017-05-18 DIAGNOSIS — R413 Other amnesia: Secondary | ICD-10-CM | POA: Diagnosis not present

## 2017-05-20 ENCOUNTER — Ambulatory Visit: Payer: Medicare Other | Attending: Orthopedic Surgery | Admitting: Physical Therapy

## 2017-05-20 ENCOUNTER — Encounter: Payer: Self-pay | Admitting: Physical Therapy

## 2017-05-20 ENCOUNTER — Encounter: Payer: Medicare Other | Attending: Psychology | Admitting: Psychology

## 2017-05-20 DIAGNOSIS — M25552 Pain in left hip: Secondary | ICD-10-CM

## 2017-05-20 DIAGNOSIS — M25551 Pain in right hip: Secondary | ICD-10-CM | POA: Diagnosis not present

## 2017-05-20 DIAGNOSIS — R262 Difficulty in walking, not elsewhere classified: Secondary | ICD-10-CM | POA: Diagnosis not present

## 2017-05-20 DIAGNOSIS — M6281 Muscle weakness (generalized): Secondary | ICD-10-CM

## 2017-05-20 NOTE — Therapy (Signed)
[place], [place], [address] Phone: [phone]   Fax:  [phone]  Physical Therapy Evaluation  Patient Details  Name: Joy Ward MRN: [id number] Date of Birth: 05-25-1940 Referring Provider: [name] Savin MD   Encounter Date: 05/20/2017  PT End of Session - 05/20/17 1700    Visit Number  1    Number of Visits  12    Date for PT Re-Evaluation  07/01/17    Authorization Type  Medicare/ Medicaid    PT Start Time  1350    PT Stop Time  7782    PT Time Calculation (min)  55 min    Activity Tolerance  Patient limited by pain;Patient tolerated treatment well    Behavior During Therapy  [facility] for tasks assessed/performed       Past Medical History:  Diagnosis Date  . Allergy   . Arthritis   . Diabetes mellitus without complication ([place])   . Diabetes type 2, uncontrolled ([place]) 09/18/2006   Annotation: Noninsulin dependent Qualifier: Diagnosis of  By: Amil Amen MD, Benjamine Mola    . Former tobacco use   . Hyperlipidemia    a. H/o muscle aches with statins per PCP notes.  . Hypertension   . Hypothyroidism    a. prior thyroid removal.  . Stroke ([place])    a. Listed in PCP notes: "History of stroke:  History of terrible headache.  Had a CT scan of brain and found "ministroke"  Was removed from ERT subsequently. "    Past Surgical History:  Procedure Laterality Date  . ABDOMINAL HYSTERECTOMY  1994   TAH, not clear if unilateral oophorectomy as well.  [facility] [place]  . CHOLECYSTECTOMY  1973   open  . DECOMPRESSIVE LUMBAR LAMINECTOMY LEVEL 4  2005   In [place]  . THYROIDECTOMY  2012   multinodular goiter    There were no vitals filed for this visit.   Subjective Assessment - 05/20/17 1604    Subjective  I have hip pain on both sides and I want it to be healed. I rub them down at night with voltaren.  My memory is not that good    Pertinent History  L4/L5 back surgery  2005, IDDM, Stroke,     Limitations  Walking;House hold activities;Other (comment) sleepping and transition movements I cant get in and out of tub    How long can you walk comfortably?  47minutes because it hurts    Patient Stated Goals  goal is to have no pain in hips and to sleep better    Currently in Pain?  Yes    Pain Score  5     Pain Location  Hip    Pain Orientation  Left    Pain Descriptors / Indicators  Aching    Pain Type  Chronic pain    Pain Onset  More than a month ago    Pain Frequency  Constant    Aggravating Factors   sleeping, standing up, walking, getting up from chair, putting on pants I feel pain    Pain Score  5    Pain Location  Hip    Pain Orientation  Right    Pain Descriptors / Indicators  Aching    Pain Type  Chronic pain    Pain Onset  More than a month ago    Pain Frequency  Constant         [facility] PT Assessment - 05/20/17  1612      Assessment   Medical Diagnosis  bil hip pain    Referring Provider  Swinteck, Hilton Cork MD    Onset Date/Surgical Date  -- 25 years but with shots     Hand Dominance  Right    Next MD Visit  hasnt made appt yet    Prior Therapy  never had PT      Precautions   Precautions  None      Restrictions   Weight Bearing Restrictions  No      Balance Screen   Has the patient fallen in the past 6 months  No    Has the patient had a decrease in activity level because of a fear of falling?   No    Is the patient reluctant to leave their home because of a fear of falling?   No      Home Environment   Living Environment  Private residence    Living Arrangements  Alone    Type of [place] to enter    Entrance Stairs-Number of Steps  0    Home Layout  One level      Prior Function   Level of Independence  Independent      Cognition   Overall Cognitive Status  Within Functional Limits for tasks assessed      Observation/Other Assessments   Focus on Therapeutic Outcomes (FOTO)   FOTO intake 35 %  limitiation 65%  predicted 54%      Functional Tests   Functional tests  Sit to Stand      Sit to Stand   Comments  4 times in 30 sec   Normal is 77 for her age      Posture/Postural Control   Posture/Postural Control  Postural limitations    Postural Limitations  Rounded Shoulders;Forward head;Decreased thoracic kyphosis;Flexed trunk      ROM / Strength   AROM / PROM / Strength  AROM;Strength      AROM   Overall AROM   Deficits    Overall AROM Comments  Pt with weakness    Right Hip Extension  0    Right Hip Flexion  95    Right Hip External Rotation   25    Right Hip Internal Rotation   18    Right Hip ABduction  20    Left Hip Extension  0    Left Hip Flexion  95    Left Hip External Rotation   22    Left Hip Internal Rotation   20    Left Hip ABduction  20      Strength   Overall Strength  Deficits    Right Hip Flexion  4-/5    Right Hip Extension  2+/5    Right Hip ABduction  3-/5    Left Hip Flexion  4-/5    Left Hip Extension  2+/5    Left Hip ABduction  3-/5      Palpation   Palpation comment  tenderness over bi greater trochanters marked.  also tenderness over right anterior shin,  grossly tender over gluteals bil      Special Tests    Special Tests  Hip Special Tests      Marcello Moores Test    Findings  Positive    Comments  bil  30 degrees from horizontal bil      Ober's Test   Findings  Positive    Comments  bil      Ambulation/Gait   Ambulation/Gait  Yes    Ambulation/Gait Assistance  7: Independent    Ambulation Distance (Feet)  200 Feet    Gait Pattern  Decreased stride length;Trunk flexed;Decreased hip/knee flexion - right;Decreased hip/knee flexion - left    Gait velocity  1.76 ft/sec             Objective measurements completed on examination: See above findings.      [facility] Adult PT Treatment/Exercise - 05/20/17 1612      Knee/Hip Exercises: Stretches   ITB Stretch  2 reps;30 seconds right and left    ITB Stretch Limitations   standing and supine total 4 repetitions      Modalities   Modalities  Moist Heat      Moist Heat Therapy   Number Minutes Moist Heat  10 Minutes    Moist Heat Location  Lumbar Spine             PT Education - 05/20/17 1659    Education provided  Yes    Education Details  POC Explanation of findings,  INitial HEP stretches    Person(s) Educated  Patient    Methods  Explanation;Demonstration;Tactile cues;Verbal cues;Handout    Comprehension  Verbalized understanding;Returned demonstration;Need further instruction       PT Short Term Goals - 05/20/17 1709      PT SHORT TERM GOAL #1   Title  STG=LTG        PT Long Term Goals - 05/20/17 1709      PT LONG TERM GOAL #1   Title  "Pt will be independent with advanced HEP.     Time  6    Period  Weeks    Status  New    Target Date  07/01/17      PT LONG TERM GOAL #2   Title  Pt will demontrate increased LE strength by rising sit to stand 10 x in 30 seconds without compensations    Baseline  baseline 4 in 30 seconds     Time  6    Period  Weeks    Status  New    Target Date  07/01/17      PT LONG TERM GOAL #3   Title  Pt will show increased AROM in LE by being ablet to don pants without exacerbating hip pain greater than 3/10    Time  6    Period  Weeks    Status  New    Target Date  07/01/17      PT LONG TERM GOAL #4   Title  Pt will be able to stand and walk for 30 minutes without exacerbating hip pain greater than 3/10 in order to perform household chores    Time  6    Period  Weeks    Status  New    Target Date  07/01/17      PT LONG TERM GOAL #5   Title  "FOTO will improve from  65% limitation  to  54% limitation   indicating improved functional mobility    Time  6    Period  Weeks    Status  New    Target Date  07/01/17      PT LONG TERM GOAL #6   Title  Pt will be able to negotiate steps safely without increasing bil hip pain greater than 4/10    Time  6  Period  Weeks    Status  New     Target Date  07/01/17      PT LONG TERM GOAL #7   Title  Pt gait velocity will increase to 2.62 ft/sec ( community ambulator) in order to safely walk in neighborhood    Time  6    Period  Weeks    Status  New    Target Date  07/01/17             Plan - 05/20/17 1701    Clinical Impression Statement  77 yo presents with chronic bil hip pain and extensive pain history.  Pt reports she has had injections for hips but has never had PT. Pt requires extra time to complete tasks. Pt sit to stand test is 4 in 30 seconds ( < 10 for age is risk for fall) Pt presents with impairments including pain, hip and LE weakness, decreased ROM, difficulty with walking, stairs, and with pain with transfers .and other transitional movements.  Pt is able to drive herself but complains  of pain when doing very minimal movement  Pt will benefit form skilled PT to address deficits and decrease risk of falls.  Pt states she has presently not had any falls  but does not take baths due to risk.      History and Personal Factors relevant to plan of care:  L/4L5 fusion as per pt 2005, DM lives alone, chronic back pain and hip pain bil.  See History    Clinical Presentation  Stable    Clinical Decision Making  Low    Rehab Potential  Good    PT Frequency  2x / week    PT Duration  6 weeks    PT Treatment/Interventions  ADLs/Self Care Home Management;Cryotherapy;Electrical Stimulation;Iontophoresis 4mg /ml Dexamethasone;Moist Heat;Ultrasound;Gait training;Stair training;Therapeutic activities;Therapeutic exercise;Neuromuscular re-education;Patient/family education;Passive range of motion;Manual techniques;Dry needling;Taping    PT Next Visit Plan  possible Iontophoresis.  hip strengthening and stretching.  hip flexor stretch review IT band stretch supine and standing against wall    PT Home Exercise Plan  IT band stretch supine and standing against wall    Consulted and Agree with Plan of Care  Patient       Patient  will benefit from skilled therapeutic intervention in order to improve the following deficits and impairments:  Difficulty walking, Decreased mobility, Decreased range of motion, Decreased strength, Impaired flexibility, Postural dysfunction, Improper body mechanics, Pain, Obesity, Decreased activity tolerance  Visit Diagnosis: Pain in left hip  Pain in right hip  Muscle weakness (generalized)  Difficulty in walking, not elsewhere classified     Problem List Patient Active Problem List   Diagnosis Date Noted  . Elevated liver enzymes 10/15/2016  . Chest pain 04/22/2016  . Systolic murmur 47/82/9562  . Hypercalcemia 04/22/2016  . Hyperlipidemia 04/22/2016  . Baker's cyst of knee, right 04/21/2016  . HYPOKALEMIA 01/24/2010  . CONSTIPATION 12/23/2009  . INTERMITTENT VERTIGO 12/23/2009  . SINUSITIS, ACUTE 04/25/2009  . VAGINITIS, CANDIDAL 02/14/2009  . HIP PAIN, RIGHT 02/14/2009  . HYPERLIPIDEMIA, MIXED 01/01/2009  . BACK PAIN, LUMBAR, WITH RADICULOPATHY 12/25/2008  . KNEE PAIN, BILATERAL 12/18/2008  . TROCHANTERIC BURSITIS, BILATERAL 12/18/2008  . Disturbance in sleep behavior 12/18/2008  . ONYCHOMYCOSIS, TOENAILS 10/30/2008  . ENTERITIS, CLOSTRIDIUM DIFFICILE 10/15/2006  . Diabetes type 2, uncontrolled ([place]) 09/18/2006  . Essential hypertension 09/18/2006  . Allergic rhinitis 09/18/2006  . GOITER, MULTINODULAR 06/29/2006  . STROKE 04/27/2001    Voncille Lo, PT  Certified Exercise Expert for the Aging Adult  05/20/17 5:38 PM Phone: [phone] Fax: [phone]  By signing I understand that I am ordering/authorizing the use of Iontophoresis using 4 mg/mL of dexamethasone as a component of this plan of care. [place], [place], [address] Phone: [phone]   Fax:  [phone]  Name: Braileigh Landenberger MRN: [id number] Date of Birth: 1941/04/08

## 2017-05-20 NOTE — Patient Instructions (Addendum)
These are two IT band stretches.  You can choose which one you like better.  But do it 3 times a day.    Outer Hip Stretch: Reclined IT Band Stretch (Strap)    Strap around opposite foot and bring great toe of right foot in line with left shouder Hold for 30 to 60 seconds  Repeat _2___ times each leg.    IT Band: Wall Lean With Crossed Leg    Stand with left hand on wall. Cross right leg behind other leg. Stretch hip toward wall with other arm supporting trunk. Hold 30 sec to 60 seconds__ seconds. Relax. Repeat _1__ times. Do _2-3__ times a day. Do both hips Repeat on other side.  Copyright  VHI. All rights reserved.   Voncille Lo, PT Certified Exercise Expert for the Aging Adult  05/20/17 4:28 PM Phone: [phone] Fax: [phone]

## 2017-05-27 ENCOUNTER — Ambulatory Visit: Payer: Medicare Other | Admitting: Physical Therapy

## 2017-05-28 DIAGNOSIS — G894 Chronic pain syndrome: Secondary | ICD-10-CM | POA: Diagnosis not present

## 2017-05-28 DIAGNOSIS — M545 Low back pain: Secondary | ICD-10-CM | POA: Diagnosis not present

## 2017-05-29 DIAGNOSIS — Z23 Encounter for immunization: Secondary | ICD-10-CM | POA: Diagnosis not present

## 2017-05-31 ENCOUNTER — Encounter: Payer: Self-pay | Admitting: Neurology

## 2017-05-31 ENCOUNTER — Ambulatory Visit (INDEPENDENT_AMBULATORY_CARE_PROVIDER_SITE_OTHER): Payer: Medicare Other | Admitting: Neurology

## 2017-05-31 VITALS — BP 120/77 | HR 75 | Wt 182.5 lb

## 2017-05-31 DIAGNOSIS — G4701 Insomnia due to medical condition: Secondary | ICD-10-CM | POA: Diagnosis not present

## 2017-05-31 DIAGNOSIS — G4721 Circadian rhythm sleep disorder, delayed sleep phase type: Secondary | ICD-10-CM

## 2017-05-31 DIAGNOSIS — R0683 Snoring: Secondary | ICD-10-CM

## 2017-05-31 DIAGNOSIS — R413 Other amnesia: Secondary | ICD-10-CM

## 2017-05-31 DIAGNOSIS — G8929 Other chronic pain: Secondary | ICD-10-CM

## 2017-05-31 MED ORDER — BUPROPION HCL ER (XL) 150 MG PO TB24: 150.0000 mg | ORAL_TABLET | Freq: Every day | ORAL | 5 refills | Status: DC

## 2017-05-31 NOTE — Progress Notes (Signed)
SLEEP MEDICINE CLINIC   Provider:  Larey Seat, [place] D  Primary Care Physician:  Lucianne Lei, MD   Referring Provider:   Chief Complaint  Patient presents with  . New Patient (Initial Visit)    Patient has trouble getting to sleep at night.     HPI:  Joy Ward is a 77 y.o. female , seen here  in a referral  from  Dr. Lucianne Lei, Dr Nelva Bush fro excessive daytime sleepiness.   The patient is a 77 year old African-American right-handed lady, who has been followed by Dr. Nelva Bush .  She had mentioned to him that she sleeps 12 or more hours each day and this hypersomnia is the reason for today's referral.  Reports that she has been excessively daytime sleepy since 2016.  She has a history of thyroidectomy, trochanteric bursitis of both hips, degenerative disc disease, neuroforaminal stenosis of the lumbar spine, osteoarthritis, plantar fasciitis, she is a former smoker, hypertension, allergic rhinitis, diabetes and glaucoma.   She has used the following medications for pain control :hydrocodone and Ultram(pain level can be around 4-5 out of 10 points and she has received cortisone injections, the last one in September 2018 for back pain, no history of doctor shopping, she has not shown addiction problems)- but she also mentioned memory issues.  Patient has lived in different states over the last 20 years and some of her medical history dates back to when she lived in [place], other parts to [place]. In  2012  She underwent thyroidectomy, and became sensitive to cold. Insomnia has been a problem for decades. About 20 years ago the patient had menopausal sleep problems, could not go to sleep and was given Ambien then as a brand name.  She has taken Ambien  10 mg for all these years but stopped it when she became excessively daytime sleepy. Now she seems to be able to go to sleep without any sleep aid -her problem is EDS.  She is now taking an Ambien still 1 or 2 days out of 14 days.  Much less than before.   Chief complaint according to patient :" I am so very sleepy, and forgetful."  Sleep habits are as follows: The patient reports a bedtime between midnight and 0:30 AM.  She has tried to go earlier to bed only to find herself tossing and turning and not be able to go to sleep.  She has seen a sleep doctor in [place] before who told her that she should leave the bed instead of staying.  She often leaves the bedroom and watches TV, then may return to the bedroom at about 1 AM again, she has about 3 bathroom breaks each night. Prefers to sleep on her right side but struggles with back pain and bursitis hip pain. She is struggling with daytime sleepiness right after she wakes up.  She may wake up as early as 8 AM but she may not rise until 2:00 some days. She has tried to get up earlier but then she falls asleep easily again.  Right now she is so sleepy she does not trust herself driving.   Sleep medical history and family sleep history: All brothers have predeceased her and her sister only living sibling has Alzheimer. I reviewed the medication list.   Social history: raised 7 children, husband left when the youngest was 109 years of age, oldest is now 43 born in [birth year], youngest is now 47 , born in [birth year]. The patient moved from  [place] to [place], and back to her [address] in [place]. The patient was a former smoker but quit over 50 years ago, she at the same time stop drinking alcohol, caffeine use form of ice tea, but not coffee or soda.  Review of Systems: Out of a complete 14 system review, the patient complains of only the following symptoms, and all other reviewed systems are negative. She cries a lot, is forgetful, excessively daytime sleepy, did not endorse fatigue or depression but does have delayed sleep phase.   Epworth score 14/ 24   , Fatigue severity score n/a   , depression score n/a .  For on her years of employment she was in earlier to  bed and early to rise.  Menopause seems to have changed her sleep pattern and she has more morbid, delayed sleep phase person.  She also has several medications that could account for excessive daytime sleepiness including Ambien currently only prescribed at 5 mg Zanaflex 4 mg tablets which she said she does not take anymore and Norco 10/325 mg tablets   Social History   Socioeconomic History  . Marital status: Single    Spouse name: Not on file  . Number of children: 7  . Years of education: 60  . Highest education level: Not on file  Social Needs  . Financial resource strain: Not on file  . Food insecurity - worry: Not on file  . Food insecurity - inability: Not on file  . Transportation needs - medical: Not on file  . Transportation needs - non-medical: Not on file  Occupational History  . Occupation: pastor--travels a lot.  Tobacco Use  . Smoking status: Former Research scientist (life sciences)  . Smokeless tobacco: Never Used  . Tobacco comment: No smoking since 77 yo - smoked 10 yrs  Substance and Sexual Activity  . Alcohol use: No  . Drug use: No  . Sexual activity: Not on file  Other Topics Concern  . Not on file  Social History Narrative   Lives alone in [place].   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    Family History  Problem Relation Age of Onset  . Heart disease Mother        unclear details "atherosclerosis"  . Diabetes Mother   . Alcohol abuse Sister   . Diabetes Brother   . Diabetes Brother   . Drug abuse Brother   . Diabetes Daughter   . Heart disease Son        CHF  . Alcohol abuse Son     Past Medical History:  Diagnosis Date  . Allergy   . Arthritis   . DDD (degenerative disc disease), lumbar   . Diabetes mellitus without complication ([place])   . Diabetes type 2, uncontrolled ([place]) 09/18/2006   Annotation: Noninsulin dependent Qualifier: Diagnosis of  By: Amil Amen MD, Benjamine Mola    . Former tobacco use   .  Hyperlipidemia    a. H/o muscle aches with statins per PCP notes.  . Hypertension   . Hypothyroidism    a. prior thyroid removal.  . Stroke ([place])    a. Listed in PCP notes: "History of stroke:  History of terrible headache.  Had a CT scan of brain and found "ministroke"  Was removed from ERT subsequently. "    Past Surgical History:  Procedure Laterality Date  . ABDOMINAL HYSTERECTOMY  1994   TAH, not clear if unilateral oophorectomy  as well.  Marland Kitchen [place]  . CHOLECYSTECTOMY  1973   open  . DECOMPRESSIVE LUMBAR LAMINECTOMY LEVEL 4  2005   In [place]  . THYROIDECTOMY  2012   multinodular goiter    Current Outpatient Medications  Medication Sig Dispense Refill  . ACCU-CHEK SOFTCLIX LANCETS lancets Use as instructed 300 each 3  . amLODipine (NORVASC) 5 MG tablet Take 1 tablet (5 mg total) by mouth daily. 90 tablet 3  . aspirin EC 81 MG EC tablet Take 1 tablet (81 mg total) by mouth daily. 30 tablet 0  . betamethasone dipropionate (DIPROLENE) 0.05 % cream Apply topically 2 (two) times daily.    . bimatoprost (LUMIGAN) 0.01 % SOLN Place 1 drop into the left eye at bedtime.    . Blood Glucose Monitoring Suppl (ACCU-CHEK AVIVA PLUS) w/Device KIT use as directed to CHECK BLOOD SUGAR 1 kit 0  . diclofenac sodium (VOLTAREN) 1 % GEL Apply 4 g topically 2 (two) times daily as needed (pain). 100 g 11  . fexofenadine-pseudoephedrine (ALLEGRA-D) 60-120 MG 12 hr tablet Take 1 tablet by mouth 2 (two) times daily. 60 tablet 2  . fluticasone (FLONASE) 50 MCG/ACT nasal spray Place 2 sprays into both nostrils daily. 16 g 6  . Foot Care Products [facility] GEL HEEL CUP MED/LG) MISC Wear in shoes all the time 2 each 0  . furosemide (LASIX) 20 MG tablet 1 tab by mouth daily if swelling of legs.  Eat a banana too. 30 tablet 0  . glimepiride (AMARYL) 2 MG tablet 1 tab by mouth twice daily with meals 180 tablet 3  . glucose blood (ACCU-CHEK AVIVA) test strip Check sugars twice daily  before meals 300 each 3  . hydrochlorothiazide (HYDRODIURIL) 25 MG tablet 1 tab by mouth every morning. 90 tablet 3  . HYDROcodone-acetaminophen (NORCO) 10-325 MG tablet Take 1 tablet by mouth every 6 (six) hours as needed (pain).     Elmore Guise Devices (ACCU-CHEK SOFTCLIX) lancets Use as instructed 1 each 0  . levothyroxine (SYNTHROID, LEVOTHROID) 88 MCG tablet Take 1 tablet (88 mcg total) by mouth daily before breakfast. 90 tablet 3  . losartan (COZAAR) 100 MG tablet Take 1 tablet (100 mg total) by mouth daily. 90 tablet 3  . metFORMIN (GLUCOPHAGE-XR) 500 MG 24 hr tablet Take 1 tablet (500 mg total) by mouth daily with breakfast. 30 tablet 11  . Multiple Vitamin (MULTIVITAMIN WITH MINERALS) TABS tablet Take 1 tablet by mouth daily.    . Nutritional Supplements (ESTROVEN PO) Take 1 tablet by mouth daily.    . Omega-3 Fatty Acids (FISH OIL) 1000 MG CAPS Take 1,000 mg by mouth 2 (two) times daily.     Marland Kitchen Phenylephrine-APAP-Guaifenesin (TYLENOL SINUS SEVERE PO) Take 2 tablets by mouth daily as needed (sinus and congestion).     . polyethylene glycol (MIRALAX / GLYCOLAX) packet Take 17 g by mouth daily. 100 each 3  . potassium chloride SA (K-DUR,KLOR-CON) 20 MEQ tablet Take 1 tablet (20 mEq total) by mouth daily. 90 tablet 3  . ranitidine (ZANTAC) 300 MG tablet Take 1 tablet (300 mg total) by mouth daily. 90 tablet 3  . zolpidem (AMBIEN) 5 MG tablet Take 5 mg by mouth at bedtime as needed for sleep.     No current facility-administered medications for this visit.     Allergies as of 05/31/2017 - Review Complete 05/31/2017  Allergen Reaction Noted  . Clindamycin hcl Other (See Comments) 10/15/2006  . Zanaflex [  tizanidine hcl] Anaphylaxis 10/15/2016  . Metronidazole Hives 09/18/2006  . Trazodone and nefazodone Other (See Comments) 04/22/2016  . Statins Other (See Comments) 05/02/2016  . Ace inhibitors Cough 09/18/2006  . Adhesive [tape] Itching, Rash, and Other (See Comments) 04/22/2016     Vitals: BP 120/77   Pulse 75   Wt 182 lb 8 oz (82.8 kg)   BMI 33.11 kg/m  Last Weight:  Wt Readings from Last 1 Encounters:  05/31/17 182 lb 8 oz (82.8 kg)   EVO:JJKK mass index is 33.11 kg/m.     Last Height:   Ht Readings from Last 1 Encounters:  03/26/17 5' 2.25" (1.581 m)    Physical exam:  General: The patient is awake, alert and appears not in acute distress. The patient is well groomed. Head: Normocephalic, atraumatic. Neck is supple. Mallampati  3,  neck circumference:14. Nasal airflow congested -  Retrognathia is not seen.  Cardiovascular:  Regular rate and rhythm , without  murmurs or carotid bruit, and without distended neck veins. Respiratory: Lungs are clear to auscultation. Skin:  Without evidence of edema, or rash Trunk: BMI is 33. The patient's posture is erect/   Neurologic exam : The patient is awake and alert, oriented to place and time.   Memory subjective  described as Impaired - I cannot address this in a sleep consult visit under Medicare criteria.  MMSE: MMSE - Mini Mental State Exam 07/15/2016  Orientation to time 5  Orientation to Place 5  Registration 3  Attention/ Calculation 5  Recall 3  Recall-comments Initially stated she could not remember and remembered everything else she had been asked, but then came up with the three things to recall.  Language- name 2 objects 2  Language- repeat 1  Language- follow 3 step command 3  Language- read & follow direction 1  Write a sentence 1  Copy design 1  Total score 30     Attention span & concentration ability appears normal.  Speech is fluent,  without  dysarthria, dysphonia or aphasia.  Mood and affect are appropriate.  Cranial nerves: Pupils are equal and briskly reactive to light. Funduscopic exam without evidence of pallor or edema. Extraocular movements  in vertical and horizontal planes intact and without nystagmus. Visual fields by finger perimetry are intact. Hearing to finger rub  intact.  Facial sensation intact to fine touch. Facial motor strength is symmetric and tongue and uvula move midline. Shoulder shrug was symmetrical.  Motor exam:  Normal tone, muscle bulk and symmetric strength in all extremities. Sensory:  Fine touch, pinprick and vibration were tested in all extremities. Proprioception tested in the upper extremities was normal. Coordination: Rapid alternating movements in the fingers/hands was normal. Finger-to-nose maneuver  normal without evidence of ataxia, dysmetria or tremor. Gait and station: Patient walks without assistive device and is able unassisted to climb up to the exam table. Strength within normal limits. Stance is stable and normal. Deep tendon reflexes: in the upper and lower extremities are symmetric and intact. Babinski maneuver response is downgoing.   Assessment:  After physical and neurologic examination, review of laboratory studies,  Personal review of imaging studies, reports of other /same  Imaging studies, results of polysomnography and / or neurophysiology testing and pre-existing records as far as provided in visit., my assessment is   1) in the past the patient has sometimes woken from her own snoring, but there is nobody witnessing the way she sleeps now.  Given that she is older now  she may have a higher degree of upper airway resistance.  I would like to test her for the presence of disordered breathing  2) the patient reports excessive daytime sleepiness but the inability to go to sleep easily at night especially with the inability to go to sleep before midnight.  It seems that this pattern has very slowly evolved since retirement and it fits a delayed sleep phase sleep disorder.  It is probably not aided by using Ambien.  I would give the patient a booklet about insomnia, urging her to keep her bedroom cool, quiet and dark.  To not watch TV in the bedroom but in the den which she all does now.  She did try to get herself into mood  conducive to initiate sleep.  He may take a hot shower before bedtime, but she also says she already has implemented.  She is trying to wake herself up in the mornings and staying awake but has failed to do so.  I do wonder if Wellbutrin or an antidepressant may help her especially since she is also reportedly very tearful, easily emotional.  3) subjectively reports memory loss to Mini-Mental status examinations in 2015 and 2018 were excellent, however there should be a regular follow-up.  I would like for the patient to first participate in a [place] cognitive assessment then follow this with him in mental status. This will need to take place in our next appointment.    The patient was advised of the nature of the diagnosed disorder , the treatment options and the  risks for general health and wellness arising from not treating the condition.   I spent more than 55 minutes of face to face time with the patient.  Greater than 50% of time was spent in counseling and coordination of care. We have discussed the diagnosis and differential and I answered the patient's questions.    Plan:  1) Treatment plan and additional workup :ciradian sleep disorder- delayed phase. 2) Depression ? Tearful. This may be a contributor to insomnia.  Discussed sleep hygiene.  3) check for OSA - sleep study ordered. 4) check for memory loss.  Rv with MOCA and MMSE - no time today.    Larey Seat, MD 11/01/9394, 8:86 PM  Certified in Neurology by ABPN Certified in [place] by [facility] [address] [place], [place] [address]

## 2017-05-31 NOTE — Patient Instructions (Signed)
I have asked Joy Ward to set an alarm clock to a time between 8 and 9 AM, and take her thyroid medication first, and within 30 minutes the newly prescribed Wellbutrin, generic name bupropion.  This should give her energy and allow her to stay alert instead of drifting back to sleep.  In addition I will order a sleep study to make sure that we are not missing apnea, sleep disordered breathing, upper airway resistance or hypoxemia at night. She does have multiple aches and pains.  And she has nocturia. Chronic insomnia she is best followed by a psychologist, however I first want to rule out organic reasons  In our next visit which I will schedule for the end of the day I want to address her memory concerns especially, we will do a Mini-Mental status examination as well as a Montreal cognitive assessment.  Moca is usually more detailed- gives more ideas about dementia being present, or in what form.

## 2017-06-01 ENCOUNTER — Encounter: Payer: Medicare Other | Admitting: Physical Therapy

## 2017-06-08 ENCOUNTER — Ambulatory Visit: Payer: Medicare Other | Attending: Orthopedic Surgery | Admitting: Physical Therapy

## 2017-06-08 ENCOUNTER — Encounter: Payer: Self-pay | Admitting: Physical Therapy

## 2017-06-08 DIAGNOSIS — R262 Difficulty in walking, not elsewhere classified: Secondary | ICD-10-CM | POA: Insufficient documentation

## 2017-06-08 DIAGNOSIS — M6281 Muscle weakness (generalized): Secondary | ICD-10-CM | POA: Insufficient documentation

## 2017-06-08 DIAGNOSIS — M25552 Pain in left hip: Secondary | ICD-10-CM | POA: Diagnosis not present

## 2017-06-08 DIAGNOSIS — M25551 Pain in right hip: Secondary | ICD-10-CM | POA: Diagnosis not present

## 2017-06-08 NOTE — Patient Instructions (Signed)
No diarrhea prior to next visit. See MD if it continues

## 2017-06-08 NOTE — Therapy (Signed)
[place], [place], [address] Phone: [phone]   Fax:  [phone]  Physical Therapy Treatment  Patient Details  Name: Joy Ward MRN: [id number] Date of Birth: Oct 22, 1940 Referring Provider: [name] Savin MD   Encounter Date: 06/08/2017  PT End of Session - 06/08/17 1743    Visit Number  2    Number of Visits  12    Date for PT Re-Evaluation  07/01/17    PT Start Time  6962    PT Stop Time  1629    PT Time Calculation (min)  42 min    Behavior During Therapy  [facility] for tasks assessed/performed       Past Medical History:  Diagnosis Date  . Allergy   . Arthritis   . DDD (degenerative disc disease), lumbar   . Diabetes mellitus without complication ([place])   . Diabetes type 2, uncontrolled ([place]) 09/18/2006   Annotation: Noninsulin dependent Qualifier: Diagnosis of  By: Amil Amen MD, Benjamine Mola    . Former tobacco use   . Hyperlipidemia    a. H/o muscle aches with statins per PCP notes.  . Hypertension   . Hypothyroidism    a. prior thyroid removal.  . Stroke ([place])    a. Listed in PCP notes: "History of stroke:  History of terrible headache.  Had a CT scan of brain and found "ministroke"  Was removed from ERT subsequently. "    Past Surgical History:  Procedure Laterality Date  . ABDOMINAL HYSTERECTOMY  1994   TAH, not clear if unilateral oophorectomy as well.  Marland Kitchen [place]  . CHOLECYSTECTOMY  1973   open  . DECOMPRESSIVE LUMBAR LAMINECTOMY LEVEL 4  2005   In [place]  . THYROIDECTOMY  2012   multinodular goiter    There were no vitals filed for this visit.  Subjective Assessment - 06/08/17 1617    Subjective  Pain decreased several days after last stretching,    I stretch some at home.    Currently in Pain?  Yes    Pain Score  6     Pain Location  Hip    Pain Orientation  Left    Pain Descriptors / Indicators  Aching    Pain Type  Chronic pain    Pain  Frequency  Constant    Aggravating Factors   walking, sleeping standing, getting up from chair,  touching    Pain Relieving Factors  voltarin gel    Multiple Pain Sites  -- shin pain tender to touch    Pain Score  5    Pain Location  Hip    Pain Orientation  Right    Pain Descriptors / Indicators  Aching;Sore    Pain Type  Chronic pain    Pain Frequency  Constant    Aggravating Factors   as above    Pain Relieving Factors  as above                      [facility] Adult PT Treatment/Exercise - 06/08/17 0001      Self-Care   Self-Care  Other Self-Care Comments    Other Self-Care Comments   see MD if diarrhea does not get better.  Ask MD who to see for nutrition info.      Knee/Hip Exercises: Stretches   Quad Stretch  3 reps;20 seconds supine gentle,  needed assist    Hip Flexor Stretch  3  reps;30 seconds off edge od mat with assistance    ITB Stretch Limitations  multiple cues needed,  3 positions tried each hip.  pain increased with standing ex so put those on hold and added on side with knee slightly flexed  due to she could control stretch better      Knee/Hip Exercises: Standing   Other Standing Knee Exercises  weight shifting side to side      Knee/Hip Exercises: Seated   Ball Squeeze  10 x 2 sets,  cued light  to decrease pain,  sitting good posture with lumbar support    Clamshell with TheraBand  Red singles 10 x 2 sets    Knee/Hip Flexion  10 x alternating with good posture,  cued for pain free    Other Seated Knee/Hip Exercises  foot press into floor 10 X each             PT Education - 06/08/17 1742    Education provided  Yes    Education Details  exercises form ,  Infectious disease policy    Person(s) Educated  Patient    Methods  Explanation;Demonstration;Verbal cues    Comprehension  Verbalized understanding;Returned demonstration       PT Short Term Goals - 05/20/17 1709      PT SHORT TERM GOAL #1   Title  STG=LTG        PT Long Term  Goals - 05/20/17 1709      PT LONG TERM GOAL #1   Title  "Pt will be independent with advanced HEP.     Time  6    Period  Weeks    Status  New    Target Date  07/01/17      PT LONG TERM GOAL #2   Title  Pt will demontrate increased LE strength by rising sit to stand 10 x in 30 seconds without compensations    Baseline  baseline 4 in 30 seconds     Time  6    Period  Weeks    Status  New    Target Date  07/01/17      PT LONG TERM GOAL #3   Title  Pt will show increased AROM in LE by being ablet to don pants without exacerbating hip pain greater than 3/10    Time  6    Period  Weeks    Status  New    Target Date  07/01/17      PT LONG TERM GOAL #4   Title  Pt will be able to stand and walk for 30 minutes without exacerbating hip pain greater than 3/10 in order to perform household chores    Time  6    Period  Weeks    Status  New    Target Date  07/01/17      PT LONG TERM GOAL #5   Title  "FOTO will improve from  65% limitation  to  54% limitation   indicating improved functional mobility    Time  6    Period  Weeks    Status  New    Target Date  07/01/17      PT LONG TERM GOAL #6   Title  Pt will be able to negotiate steps safely without increasing bil hip pain greater than 4/10    Time  6    Period  Weeks    Status  New    Target Date  07/01/17  PT LONG TERM GOAL #7   Title  Pt gait velocity will increase to 2.62 ft/sec ( community ambulator) in order to safely walk in neighborhood    Time  6    Period  Weeks    Status  New    Target Date  07/01/17            Plan - 06/08/17 1743    Clinical Impression Statement  Patient requires moderate cues with HEP so did not add any more until she accomplishes current.  Patient confided that she has been having diarrhea and she had several layers of padding just to catch anything.  She has a history of C Diff and it returns every couple of years and this is similar to the past.  I encouraged her to call the MD.     No pain increased at the end of eseeion.  No new objective findings noted yet.     PT Next Visit Plan  Check to see if diarrhea has resolved.  Hip stretch/  strengthening as tolerated.      PT Home Exercise Plan  IT band stretch supine and standing against wall (If not painful)On side with knee flexed    Consulted and Agree with Plan of Care  Patient       Patient will benefit from skilled therapeutic intervention in order to improve the following deficits and impairments:     Visit Diagnosis: Pain in left hip  Pain in right hip  Muscle weakness (generalized)  Difficulty in walking, not elsewhere classified     Problem List Patient Active Problem List   Diagnosis Date Noted  . Elevated liver enzymes 10/15/2016  . Chest pain 04/22/2016  . Systolic murmur 96/29/5284  . Hypercalcemia 04/22/2016  . Hyperlipidemia 04/22/2016  . Baker's cyst of knee, right 04/21/2016  . HYPOKALEMIA 01/24/2010  . CONSTIPATION 12/23/2009  . INTERMITTENT VERTIGO 12/23/2009  . SINUSITIS, ACUTE 04/25/2009  . VAGINITIS, CANDIDAL 02/14/2009  . HIP PAIN, RIGHT 02/14/2009  . HYPERLIPIDEMIA, MIXED 01/01/2009  . BACK PAIN, LUMBAR, WITH RADICULOPATHY 12/25/2008  . KNEE PAIN, BILATERAL 12/18/2008  . TROCHANTERIC BURSITIS, BILATERAL 12/18/2008  . Disturbance in sleep behavior 12/18/2008  . ONYCHOMYCOSIS, TOENAILS 10/30/2008  . ENTERITIS, CLOSTRIDIUM DIFFICILE 10/15/2006  . Diabetes type 2, uncontrolled ([place]) 09/18/2006  . Essential hypertension 09/18/2006  . Allergic rhinitis 09/18/2006  . GOITER, MULTINODULAR 06/29/2006  . STROKE 04/27/2001    Kiriana Worthington PTA 06/08/2017, 5:51 PM  [facility] [address] [place], [place], [address] Phone: [phone]   Fax:  [phone]  Name: Joy Ward MRN: [id number] Date of Birth: [birth year]

## 2017-06-10 ENCOUNTER — Ambulatory Visit: Payer: Medicare Other | Admitting: Physical Therapy

## 2017-06-14 ENCOUNTER — Ambulatory Visit: Payer: Medicare Other | Admitting: Physical Therapy

## 2017-06-14 ENCOUNTER — Encounter: Payer: Self-pay | Admitting: Physical Therapy

## 2017-06-14 DIAGNOSIS — R262 Difficulty in walking, not elsewhere classified: Secondary | ICD-10-CM | POA: Diagnosis not present

## 2017-06-14 DIAGNOSIS — M25551 Pain in right hip: Secondary | ICD-10-CM

## 2017-06-14 DIAGNOSIS — M25552 Pain in left hip: Secondary | ICD-10-CM

## 2017-06-14 DIAGNOSIS — M6281 Muscle weakness (generalized): Secondary | ICD-10-CM | POA: Diagnosis not present

## 2017-06-14 NOTE — Therapy (Signed)
[place], [place], [address] Phone: [phone]   Fax:  [phone]  Physical Therapy Treatment  Patient Details  Name: Joy Ward MRN: [id number] Date of Birth: 10/07/1940 Referring Provider: [name] Savin MD   Encounter Date: 06/14/2017  PT End of Session - 06/14/17 1702    Visit Number  3    Number of Visits  12    Date for PT Re-Evaluation  07/01/17    PT Start Time  1500    PT Stop Time  1555    PT Time Calculation (min)  55 min    Activity Tolerance  Patient tolerated treatment well    Behavior During Therapy  [facility] for tasks assessed/performed       Past Medical History:  Diagnosis Date  . Allergy   . Arthritis   . DDD (degenerative disc disease), lumbar   . Diabetes mellitus without complication ([place])   . Diabetes type 2, uncontrolled ([place]) 09/18/2006   Annotation: Noninsulin dependent Qualifier: Diagnosis of  By: Amil Amen MD, Benjamine Mola    . Former tobacco use   . Hyperlipidemia    a. H/o muscle aches with statins per PCP notes.  . Hypertension   . Hypothyroidism    a. prior thyroid removal.  . Stroke ([place])    a. Listed in PCP notes: "History of stroke:  History of terrible headache.  Had a CT scan of brain and found "ministroke"  Was removed from ERT subsequently. "    Past Surgical History:  Procedure Laterality Date  . ABDOMINAL HYSTERECTOMY  1994   TAH, not clear if unilateral oophorectomy as well.  Marland Kitchen [place]  . CHOLECYSTECTOMY  1973   open  . DECOMPRESSIVE LUMBAR LAMINECTOMY LEVEL 4  2005   In [place]  . THYROIDECTOMY  2012   multinodular goiter    There were no vitals filed for this visit.                   [place] Adult PT Treatment/Exercise - 06/14/17 0001      Lumbar Exercises: Stretches   Single Knee to Chest Stretch  3 reps;10 seconds right tighter than left     Lower Trunk Rotation  3 reps    Lower Trunk  Rotation Limitations  cued for pain free gentle hip stretch    Active Hamstring Stretch  1 rep;2 reps right,  gentle and still increased low back     Quad Stretch  3 reps;20 seconds supine gentle,  needed assist    ITB Stretch  3 reps;30 seconds    ITB Stretch Limitations  cues continue to be needed      Lumbar Exercises: Supine   Glut Set  10 reps    Bent Knee Raise  5 reps    Bent Knee Raise Limitations  cued for abdominal control      Knee/Hip Exercises: Stretches   Hip Flexor Stretch  3 reps;30 seconds off edge od mat with assistance      Knee/Hip Exercises: Seated   Long Arc Quad  5 reps    Knee/Hip Flexion  10 x alternating with good posture,  cued for pain free      Modalities   Modalities  Moist Heat      Moist Heat Therapy   Number Minutes Moist Heat  10 Minutes    Moist Heat Location  Hip  PT Education - 06/14/17 1702    Education provided  Yes    Education Details  HEP    Person(s) Educated  Patient    Methods  Explanation;Demonstration;Verbal cues;Handout;Tactile cues    Comprehension  Verbalized understanding;Returned demonstration       PT Short Term Goals - 05/20/17 1709      PT SHORT TERM GOAL #1   Title  STG=LTG        PT Long Term Goals - 06/14/17 1715      PT LONG TERM GOAL #1   Title  "Pt will be independent with advanced HEP.     Baseline  min/mod cues    Time  6    Period  Weeks    Status  On-going      PT LONG TERM GOAL #2   Title  Pt will demontrate increased LE strength by rising sit to stand 10 x in 30 seconds without compensations    Time  6    Status  Unable to assess      PT LONG TERM GOAL #3   Title  Pt will show increased AROM in LE by being ablet to don pants without exacerbating hip pain greater than 3/10    Time  6    Period  Weeks    Status  Unable to assess      PT LONG TERM GOAL #4   Title  Pt will be able to stand and walk for 30 minutes without exacerbating hip pain greater than 3/10 in order to  perform household chores    Time  6    Period  Weeks    Status  Unable to assess      PT LONG TERM GOAL #5   Title  "FOTO will improve from  65% limitation  to  54% limitation   indicating improved functional mobility    Time  6    Period  Weeks    Status  Unable to assess      PT LONG TERM GOAL #6   Title  Pt will be able to negotiate steps safely without increasing bil hip pain greater than 4/10    Time  6    Period  Weeks    Status  Unable to assess      PT LONG TERM GOAL #7   Title  Pt gait velocity will increase to 2.62 ft/sec ( community ambulator) in order to safely walk in neighborhood    Time  6    Period  Weeks    Status  Unable to assess            Plan - 06/14/17 1703    Clinical Impression Statement  Patient was sore all over today.  She had no diarrhea today. She tolerated exercises well despite her complaints of being sore all over. legs continue to be tender to palpation.  She says she tries to get through all her exercises daily.  Hips have had decreased pain today..  patient continues to need min/mod cues for HEP  Extra time needed for exercise instruction.  She requires the handouts to do the exercises.     PT Next Visit Plan  stretch and strengthen as tolerated. Work more on sit to stand .  Check specific goals.    PT Home Exercise Plan  IT band stretch supine and standing against wall (If not painful)On side with knee flexed, hamstring curl,  clam on side.    Consulted and  Agree with Plan of Care  Patient;Family member/caregiver    Family Member Consulted  Daughter       Patient will benefit from skilled therapeutic intervention in order to improve the following deficits and impairments:     Visit Diagnosis: Pain in left hip  Pain in right hip  Muscle weakness (generalized)  Difficulty in walking, not elsewhere classified     Problem List Patient Active Problem List   Diagnosis Date Noted  . Elevated liver enzymes 10/15/2016  . Chest pain  04/22/2016  . Systolic murmur 27/10/8673  . Hypercalcemia 04/22/2016  . Hyperlipidemia 04/22/2016  . Baker's cyst of knee, right 04/21/2016  . HYPOKALEMIA 01/24/2010  . CONSTIPATION 12/23/2009  . INTERMITTENT VERTIGO 12/23/2009  . SINUSITIS, ACUTE 04/25/2009  . VAGINITIS, CANDIDAL 02/14/2009  . HIP PAIN, RIGHT 02/14/2009  . HYPERLIPIDEMIA, MIXED 01/01/2009  . BACK PAIN, LUMBAR, WITH RADICULOPATHY 12/25/2008  . KNEE PAIN, BILATERAL 12/18/2008  . TROCHANTERIC BURSITIS, BILATERAL 12/18/2008  . Disturbance in sleep behavior 12/18/2008  . ONYCHOMYCOSIS, TOENAILS 10/30/2008  . ENTERITIS, CLOSTRIDIUM DIFFICILE 10/15/2006  . Diabetes type 2, uncontrolled ([place]) 09/18/2006  . Essential hypertension 09/18/2006  . Allergic rhinitis 09/18/2006  . GOITER, MULTINODULAR 06/29/2006  . STROKE 04/27/2001    HARRIS,KAREN PTA 06/14/2017, 5:16 PM  [facility] [address] [place], [place], [address] Phone: [phone]   Fax:  [phone]  Name: Noma Quijas MRN: [id number] Date of Birth: 26-Jun-1940

## 2017-06-14 NOTE — Patient Instructions (Addendum)
Hamstring Curl: Resisted (Sitting)    Facing anchor with tubing on right ankle, leg straight out, bend knee. Repeat __10__ times per set. Do __1__ sets per session. Do __1__ sessions per day.   http://orth.exer.us/669   Copyright  VHI. All rights reserved.  Abduction: Clam (Eccentric) - Side-Lying    Lie on side with knees bent. Lift top knee, keeping feet together. Keep trunk steady. Slowly lower for 3-5 seconds. _10__ reps.  1 x per day.  http://ecce.exer.us/65   Copyright  VHI. All rights reserved.       http://ecce.exer.us/65   Copyright  VHI. All rights reserved.

## 2017-06-17 ENCOUNTER — Ambulatory Visit: Payer: Medicare Other | Admitting: Physical Therapy

## 2017-06-21 ENCOUNTER — Ambulatory Visit: Payer: Medicare Other | Admitting: Physical Therapy

## 2017-06-23 ENCOUNTER — Ambulatory Visit: Payer: Medicare Other | Admitting: Internal Medicine

## 2017-06-24 ENCOUNTER — Encounter: Payer: Self-pay | Admitting: Physical Therapy

## 2017-06-24 ENCOUNTER — Ambulatory Visit: Payer: Medicare Other | Admitting: Physical Therapy

## 2017-06-24 DIAGNOSIS — M25551 Pain in right hip: Secondary | ICD-10-CM

## 2017-06-24 DIAGNOSIS — R262 Difficulty in walking, not elsewhere classified: Secondary | ICD-10-CM

## 2017-06-24 DIAGNOSIS — M6281 Muscle weakness (generalized): Secondary | ICD-10-CM

## 2017-06-24 DIAGNOSIS — M25552 Pain in left hip: Secondary | ICD-10-CM

## 2017-06-24 NOTE — Therapy (Signed)
[place], [place], [address] Phone: [phone]   Fax:  [phone]  Physical Therapy Treatment  Patient Details  Name: Margareta Laureano MRN: [id number] Date of Birth: 1940/06/12 Referring Provider: [name] Savin MD   Encounter Date: 06/24/2017  PT End of Session - 06/24/17 1730    Visit Number  4    Number of Visits  12    Date for PT Re-Evaluation  07/01/17    PT Start Time  1630    PT Stop Time  1712    PT Time Calculation (min)  42 min    Activity Tolerance  Patient tolerated treatment well    Behavior During Therapy  [facility] for tasks assessed/performed       Past Medical History:  Diagnosis Date  . Allergy   . Arthritis   . DDD (degenerative disc disease), lumbar   . Diabetes mellitus without complication ([place])   . Diabetes type 2, uncontrolled ([place]) 09/18/2006   Annotation: Noninsulin dependent Qualifier: Diagnosis of  By: Amil Amen MD, Benjamine Mola    . Former tobacco use   . Hyperlipidemia    a. H/o muscle aches with statins per PCP notes.  . Hypertension   . Hypothyroidism    a. prior thyroid removal.  . Stroke ([place])    a. Listed in PCP notes: "History of stroke:  History of terrible headache.  Had a CT scan of brain and found "ministroke"  Was removed from ERT subsequently. "    Past Surgical History:  Procedure Laterality Date  . ABDOMINAL HYSTERECTOMY  1994   TAH, not clear if unilateral oophorectomy as well.  Marland Kitchen [place]  . CHOLECYSTECTOMY  1973   open  . DECOMPRESSIVE LUMBAR LAMINECTOMY LEVEL 4  2005   In [place]  . THYROIDECTOMY  2012   multinodular goiter    There were no vitals filed for this visit.  Subjective Assessment - 06/24/17 1636    Subjective  knee hurts.  I have not been doing the exercises.  i have a memory problem    Currently in Pain?  Yes    Pain Score  6     Pain Location  Hip    Pain Orientation  Left    Pain Descriptors /  Indicators  Aching    Pain Type  Chronic pain    Pain Frequency  Constant    Aggravating Factors   getting up to walk    Pain Relieving Factors  voltarin    Pain Score  6    Pain Location  Back    Pain Orientation  Right knee6/10    Pain Type  Chronic pain    Pain Frequency  Constant    Aggravating Factors   getting up to walk    Pain Relieving Factors  sitting                      [facility] Adult PT Treatment/Exercise - 06/24/17 0001      Lumbar Exercises: Stretches   Active Hamstring Stretch  3 reps;30 seconds both HEP      Lumbar Exercises: Seated   Other Seated Lumbar Exercises  single hand press into thigh 5 X each side for isometric obliques    Other Seated Lumbar Exercises  isometric abdominals , pressing hands into thight with long breath out,        Lumbar Exercises: Sidelying   Clam  -- review,  reprinted HEP she did not recal having done before      Knee/Hip Exercises: Stretches   ITB Stretch  2 reps;20 seconds    ITB Stretch Limitations  cued tenique cued to  avoid pain.      Knee/Hip Exercises: Standing   Hip Abduction  1 set;10 reps;Both HEp      Knee/Hip Exercises: Seated   Long Arc Quad  10 reps 1 LB cued smaller to avoid pain    Knee/Hip Flexion  10 x alternating with good posture,  cued for pain free    Hamstring Curl  1 set;10 reps yellow band    Sit to Sand  -- needed to move slow,  major use hands got dizzy with this             PT Education - 06/24/17 1730    Education provided  Yes    Education Details  HEP, New and old    Person(s) Educated  Patient    Methods  Explanation;Tactile cues;Demonstration;Verbal cues;Handout    Comprehension  Verbalized understanding;Returned demonstration       PT Short Term Goals - 05/20/17 1709      PT SHORT TERM GOAL #1   Title  STG=LTG        PT Long Term Goals - 06/24/17 1734      PT LONG TERM GOAL #1   Title  "Pt will be independent with advanced HEP.     Baseline  min/mod cues     Time  6    Period  Weeks    Status  On-going      PT LONG TERM GOAL #2   Baseline  1 X then got dizzy today due to allergies    Time  6    Period  Weeks    Status  On-going      PT LONG TERM GOAL #3   Title  Pt will show increased AROM in LE by being ablet to don pants without exacerbating hip pain greater than 3/10    Baseline  Hip flexion pain 5-6/10 with this.  More if she gets dressed in the am.    Time  6    Period  Weeks    Status  On-going      PT LONG TERM GOAL #4   Title  Pt will be able to stand and walk for 30 minutes without exacerbating hip pain greater than 3/10 in order to perform household chores    Baseline  7/20 walking in household,  she could not say how long she was able to do    Time  6    Period  Weeks    Status  On-going      PT LONG TERM GOAL #5   Title  "FOTO will improve from  65% limitation  to  54% limitation   indicating improved functional mobility    Time  6    Period  Weeks    Status  On-going      PT LONG TERM GOAL #6   Title  Pt will be able to negotiate steps safely without increasing bil hip pain greater than 4/10    Time  6    Period  Weeks    Status  Unable to assess      PT LONG TERM GOAL #7   Title  Pt gait velocity will increase to 2.62 ft/sec ( community ambulator) in order to safely walk in neighborhood    Time  6    Period  Weeks    Status  Unable to assess            Plan - 06/24/17 1731    Clinical Impression Statement  Patient continues to not feel good. She wore a mask for her allergies.  She has not been doing the exercises.  Some of the exercises issued prevoiusly she did not remember doing so they were re printed and rewiewed.   She plans to tape them to the wall so she will remenber.    PT Next Visit Plan  stretch and strengthen as tolerated.  gait velocity.  continue with sit to stand work.  try more standing, May benefit from an exercise log,  Tried to print one today but was blocked. consider steps.    PT  Home Exercise Plan  IT band stretch supine and standing against wall ,On side with knee flexed, hamstring curl,  clam on side.  Hamstring stretch,  Standing hip abduction     Consulted and Agree with Plan of Care  Patient       Patient will benefit from skilled therapeutic intervention in order to improve the following deficits and impairments:     Visit Diagnosis: Pain in left hip  Pain in right hip  Muscle weakness (generalized)  Difficulty in walking, not elsewhere classified     Problem List Patient Active Problem List   Diagnosis Date Noted  . Elevated liver enzymes 10/15/2016  . Chest pain 04/22/2016  . Systolic murmur 45/80/9983  . Hypercalcemia 04/22/2016  . Hyperlipidemia 04/22/2016  . Baker's cyst of knee, right 04/21/2016  . HYPOKALEMIA 01/24/2010  . CONSTIPATION 12/23/2009  . INTERMITTENT VERTIGO 12/23/2009  . SINUSITIS, ACUTE 04/25/2009  . VAGINITIS, CANDIDAL 02/14/2009  . HIP PAIN, RIGHT 02/14/2009  . HYPERLIPIDEMIA, MIXED 01/01/2009  . BACK PAIN, LUMBAR, WITH RADICULOPATHY 12/25/2008  . KNEE PAIN, BILATERAL 12/18/2008  . TROCHANTERIC BURSITIS, BILATERAL 12/18/2008  . Disturbance in sleep behavior 12/18/2008  . ONYCHOMYCOSIS, TOENAILS 10/30/2008  . ENTERITIS, CLOSTRIDIUM DIFFICILE 10/15/2006  . Diabetes type 2, uncontrolled ([place]) 09/18/2006  . Essential hypertension 09/18/2006  . Allergic rhinitis 09/18/2006  . GOITER, MULTINODULAR 06/29/2006  . STROKE 04/27/2001    HARRIS,KAREN  PTA 06/24/2017, 5:38 PM  [facility] [address] [place], [place], [address] Phone: [phone]   Fax:  [phone]  Name: Armetta Henri MRN: [id number] Date of Birth: 22-Aug-1940

## 2017-06-24 NOTE — Patient Instructions (Addendum)
       http://gtsc.exer.us/111   Copyright  VHI. All rights reserved.  Leg Extension (Hamstring)     Sit toward front edge of chair, with leg out straight, heel on floor, toes pointing toward body. Keeping back straight, bend forward at hip, breathing out through pursed lips. Return Repeat _5__ times. Repeat with other leg. Do __1_ sessions per day.   Copyright  VHI. All rights reserved.  ABDUCTION: Standing (Active)    Stand, feet flat. Lift right leg out to side. Use __0_ lbs. Complete _1__ sets of _10__ repetitions. Perform _1__ sessions per day.  http://gtsc.exer.us/111   Copyright  VHI. All rights reserved.

## 2017-06-25 DIAGNOSIS — M545 Low back pain: Secondary | ICD-10-CM | POA: Diagnosis not present

## 2017-06-25 DIAGNOSIS — G894 Chronic pain syndrome: Secondary | ICD-10-CM | POA: Diagnosis not present

## 2017-06-25 DIAGNOSIS — M25569 Pain in unspecified knee: Secondary | ICD-10-CM | POA: Diagnosis not present

## 2017-06-25 DIAGNOSIS — M961 Postlaminectomy syndrome, not elsewhere classified: Secondary | ICD-10-CM | POA: Diagnosis not present

## 2017-06-25 DIAGNOSIS — M25561 Pain in right knee: Secondary | ICD-10-CM | POA: Diagnosis not present

## 2017-07-08 ENCOUNTER — Ambulatory Visit: Payer: Medicare Other | Attending: Orthopedic Surgery | Admitting: Physical Therapy

## 2017-07-08 ENCOUNTER — Encounter: Payer: Self-pay | Admitting: Physical Therapy

## 2017-07-08 DIAGNOSIS — M25551 Pain in right hip: Secondary | ICD-10-CM | POA: Diagnosis not present

## 2017-07-08 DIAGNOSIS — M6281 Muscle weakness (generalized): Secondary | ICD-10-CM | POA: Diagnosis not present

## 2017-07-08 DIAGNOSIS — R262 Difficulty in walking, not elsewhere classified: Secondary | ICD-10-CM | POA: Insufficient documentation

## 2017-07-08 DIAGNOSIS — M25552 Pain in left hip: Secondary | ICD-10-CM | POA: Diagnosis not present

## 2017-07-08 NOTE — Therapy (Addendum)
[place], [place], [address] Phone: [phone]   Fax:  [phone]  Physical Therapy Treatment/Discharge Note  Patient Details  Name: Joy Ward MRN: [id number] Date of Birth: [birth year] Referring Provider: [name] Savin MD   Encounter Date: 07/08/2017  PT End of Session - 07/08/17 1616    Visit Number  5    Number of Visits  12    Date for PT Re-Evaluation  07/01/17    Authorization Type  Medicare/ Medicaid    PT Start Time  1550    PT Stop Time  1606    PT Time Calculation (min)  16 min    Activity Tolerance  Other (comment) Pt was tearful and was consoled by PT. Pt did not wish to do any more PT    Behavior During Therapy  [facility] for tasks assessed/performed       Past Medical History:  Diagnosis Date  . Allergy   . Arthritis   . DDD (degenerative disc disease), lumbar   . Diabetes mellitus without complication ([place])   . Diabetes type 2, uncontrolled ([place]) 09/18/2006   Annotation: Noninsulin dependent Qualifier: Diagnosis of  By: Amil Amen MD, Benjamine Mola    . Former tobacco use   . Hyperlipidemia    a. H/o muscle aches with statins per PCP notes.  . Hypertension   . Hypothyroidism    a. prior thyroid removal.  . Stroke ([place])    a. Listed in PCP notes: "History of stroke:  History of terrible headache.  Had a CT scan of brain and found "ministroke"  Was removed from ERT subsequently. "    Past Surgical History:  Procedure Laterality Date  . ABDOMINAL HYSTERECTOMY  1994   TAH, not clear if unilateral oophorectomy as well.  Joy Ward [place]  . CHOLECYSTECTOMY  1973   open  . DECOMPRESSIVE LUMBAR LAMINECTOMY LEVEL 4  2005   In [place]  . THYROIDECTOMY  2012   multinodular goiter    There were no vitals filed for this visit.  Subjective Assessment - 07/08/17 1547    Subjective  I have been going to PT . I do have short term memory issues.  but I dont always  remember to do the exercises.I still have trouble sleeping and  walking. Sometimes I am better than other times I am so stressed out when I come here or if I forget about appt.  I dont want to do PT any more.  I dont think it helps. (Pt tearfully explaining)    Pertinent History  L4/L5 back surgery 2005, IDDM, Stroke,     Patient Stated Goals  goal is to have no pain in hips and to sleep better    Currently in Pain?  Yes    Pain Score  3     Pain Location  Hip    Pain Orientation  Left    Pain Descriptors / Indicators  Aching    Pain Type  Chronic pain    Pain Onset  More than a month ago    Pain Frequency  Constant    Pain Score  3    Pain Location  Back    Pain Orientation  Right knees 5/10    Pain Descriptors / Indicators  Aching;Sore    Pain Type  Chronic pain    Pain Onset  More than a month ago    Pain Frequency  Constant  [place] Adult PT Treatment/Exercise - 07/08/17 1550      Self-Care   Self-Care  Other Self-Care Comments    Other Self-Care Comments   Pt given another handout or previously done exercises. Pt given information about aquatics program at [facility] for arthritis                PT Short Term Goals - 05/20/17 1709      PT SHORT TERM GOAL #1   Title  STG=LTG        PT Long Term Goals - 07/08/17 1550      PT LONG TERM GOAL #1   Title  "Pt will be independent with advanced HEP.     Baseline  I look on the paper of my exercises and I can do them but I am slow    Time  6    Period  Weeks    Status  Partially Met      PT LONG TERM GOAL #2   Title  Pt will demontrate increased LE strength by rising sit to stand 10 x in 30 seconds without compensations    Baseline  unable to do more than 3 in 30 seconds    Time  6    Period  Weeks    Status  Not Met      PT LONG TERM GOAL #3   Title  Pt will show increased AROM in LE by being ablet to don pants without exacerbating hip pain greater than 3/10    Baseline  Pt states  she is able to get her pants on with 4/10 today but sometimes worse like 8/10    Time  6    Period  Weeks    Status  Not Met      PT LONG TERM GOAL #4   Title  Pt will be able to stand and walk for 30 minutes without exacerbating hip pain greater than 3/10 in order to perform household chores    Baseline  Pt with sporadic attendance and unable to assess    Time  6    Period  Weeks    Status  Unable to assess      PT LONG TERM GOAL #5   Title  "FOTO will improve from  65% limitation  to  54% limitation   indicating improved functional mobility    Baseline  Pt refused to take FOTO and does not want to continue PT.  Going to PT is too stressful    Time  6    Period  Weeks    Status  Unable to assess      PT LONG TERM GOAL #6   Title  Pt will be able to negotiate steps safely without increasing bil hip pain greater than 4/10    Baseline  Pt with sporadic pain from 3/10 to 8/10    Time  6    Period  Weeks    Status  Not Met      PT LONG TERM GOAL #7   Title  Pt gait velocity will increase to 2.62 ft/sec ( community ambulator) in order to safely walk in neighborhood    Baseline  Pt enters clinic without AD,  Gait velocity not assessed    Time  6    Period  Weeks    Status  Not Met            Plan - 07/08/17 1617    Clinical Impression Statement  Ms.  Thiam returns to clinic today after 7 cancellations due to sickness, cancellations, too much pain to drive or participate in PT and when she came to clinic today for re- evaluation, Ms Horney began crying and saying her short term memory loss makes coming to PT too stressful and she does not want to continue even though her hip still hurt.  PT ran off a secondary set of exercises she has performed in previous sessions and discussed  alternatives that may make her pain better.  Pt was given information about [facility] Aquatics program and advised to begin water exercise which will be easier on joints and provide some fun and joy and  socialization which pt said [place] needed [place].  Pt was asked if she had any suicidal ideation and she reported NO!Joy Ward  She was mostly stressed out about going to PT.Joy Ward wanted everyone to know she appreciated PT and Melvenia Needles but she does not think PT is helpful now. Pt will look into aquatics for exercise. Pt will be discharged as per pt request.     PT Frequency  2x / week    PT Duration  6 weeks    PT Treatment/Interventions  ADLs/Self Care Home Management;Cryotherapy;Electrical Stimulation;Iontophoresis 30m/ml Dexamethasone;Moist Heat;Ultrasound;Gait training;Stair training;Therapeutic activities;Therapeutic exercise;Neuromuscular re-education;Patient/family education;Passive range of motion;Manual techniques;Dry needling;Taping    PT Next Visit Plan  DC as per pt request and given exercise handouts of previous sessions and informatin about aquatic program    PT Home Exercise Plan  IT band stretch supine and standing against wall ,On side with knee flexed, hamstring curl,  clam on side.  Hamstring stretch,  Standing hip abduction     Consulted and Agree with Plan of Care  Patient       Patient will benefit from skilled therapeutic intervention in order to improve the following deficits and impairments:  Difficulty walking, Decreased mobility, Decreased range of motion, Decreased strength, Impaired flexibility, Postural dysfunction, Improper body mechanics, Pain, Obesity, Decreased activity tolerance  Visit Diagnosis: Pain in left hip  Pain in right hip  Muscle weakness (generalized)  Difficulty in walking, not elsewhere classified     Problem List Patient Active Problem List   Diagnosis Date Noted  . Elevated liver enzymes 10/15/2016  . Chest pain 04/22/2016  . Systolic murmur 106/30/1601 . Hypercalcemia 04/22/2016  . Hyperlipidemia 04/22/2016  . Baker's cyst of knee, right 04/21/2016  . HYPOKALEMIA 01/24/2010  . CONSTIPATION 12/23/2009  . INTERMITTENT  VERTIGO 12/23/2009  . SINUSITIS, ACUTE 04/25/2009  . VAGINITIS, CANDIDAL 02/14/2009  . HIP PAIN, RIGHT 02/14/2009  . HYPERLIPIDEMIA, MIXED 01/01/2009  . BACK PAIN, LUMBAR, WITH RADICULOPATHY 12/25/2008  . KNEE PAIN, BILATERAL 12/18/2008  . TROCHANTERIC BURSITIS, BILATERAL 12/18/2008  . Disturbance in sleep behavior 12/18/2008  . ONYCHOMYCOSIS, TOENAILS 10/30/2008  . ENTERITIS, CLOSTRIDIUM DIFFICILE 10/15/2006  . Diabetes type 2, uncontrolled ([place] 09/18/2006  . Essential hypertension 09/18/2006  . Allergic rhinitis 09/18/2006  . GOITER, MULTINODULAR 06/29/2006  . STROKE 04/27/2001  LVoncille Lo PT Certified Exercise Expert for the Aging Adult  07/08/17 4:32 PM Phone: [phone]: 3[facility]GDev[address] [place] [address]Phone: [phone]  Fax:  [phone]  Name: DMaripat BorbaMRN: [id number] of Birth: 711-Jun-1942  PHYSICAL THERAPY DISCHARGE SUMMARY  Visits from Start of Care: 5  Current functional level related to goals / functional outcomes: As above   Remaining deficits: As above   Education /  Equipment: HEP 2nd set of previous  exercises  Given to pt today Plan: Patient agrees to discharge.  Patient goals were not met. Patient is being discharged due to the patient's request.  ?????    Pt requested to DC PT, She does not believe it is helpful and feels stressed about coming to PT. Pt had 7 cancellations for various reasons.  Pt was advised to do community wellness with aquatics which will be easier on her joints.  Voncille Lo, PT Certified Exercise Expert for the Aging Adult  07/08/17 4:36 PM Phone: [phone] Fax: [phone]

## 2017-07-19 DIAGNOSIS — I10 Essential (primary) hypertension: Secondary | ICD-10-CM | POA: Diagnosis not present

## 2017-07-19 DIAGNOSIS — G8929 Other chronic pain: Secondary | ICD-10-CM | POA: Diagnosis not present

## 2017-07-19 DIAGNOSIS — E039 Hypothyroidism, unspecified: Secondary | ICD-10-CM | POA: Diagnosis not present

## 2017-07-19 DIAGNOSIS — R413 Other amnesia: Secondary | ICD-10-CM | POA: Diagnosis not present

## 2017-07-19 DIAGNOSIS — E1142 Type 2 diabetes mellitus with diabetic polyneuropathy: Secondary | ICD-10-CM | POA: Diagnosis not present

## 2017-07-23 ENCOUNTER — Ambulatory Visit (INDEPENDENT_AMBULATORY_CARE_PROVIDER_SITE_OTHER): Payer: Medicare Other | Admitting: Neurology

## 2017-07-23 DIAGNOSIS — G4721 Circadian rhythm sleep disorder, delayed sleep phase type: Secondary | ICD-10-CM

## 2017-07-23 DIAGNOSIS — R413 Other amnesia: Secondary | ICD-10-CM

## 2017-07-23 DIAGNOSIS — G8929 Other chronic pain: Secondary | ICD-10-CM

## 2017-07-23 DIAGNOSIS — G4710 Hypersomnia, unspecified: Secondary | ICD-10-CM | POA: Diagnosis not present

## 2017-07-23 DIAGNOSIS — R0683 Snoring: Secondary | ICD-10-CM

## 2017-07-23 DIAGNOSIS — G4701 Insomnia due to medical condition: Secondary | ICD-10-CM

## 2017-07-27 DIAGNOSIS — M5136 Other intervertebral disc degeneration, lumbar region: Secondary | ICD-10-CM | POA: Diagnosis not present

## 2017-07-27 DIAGNOSIS — G894 Chronic pain syndrome: Secondary | ICD-10-CM | POA: Diagnosis not present

## 2017-07-27 DIAGNOSIS — M545 Low back pain: Secondary | ICD-10-CM | POA: Diagnosis not present

## 2017-07-28 NOTE — Procedures (Signed)
PATIENT'S NAME:  Joy Ward, Joy Ward DOB:      11/01/1940      MR#:    [id number]     DATE OF RECORDING: 07/23/2017 REFERRING M.D.:  Nelva Bush, MD, Lucianne Lei, M.D. Study Performed:   Baseline Polysomnogram HISTORY:  Joy Ward is a 77 y.o right-handed female African American patient, who is been followed by Dr. Nelva Bush.  She had mentioned to him that she sleeps 12 or more hours each day- reports that she has been excessively daytime sleepy since 2016. She sees Dr. Nelva Bush for a history of degenerative disc disease, neuro-foraminal stenosis of the lumbar spine, osteoarthritis and plantar fasciitis.  Her medical diagnoses include status post thyroidectomy, former smoker, hypertension, diabetes and glaucoma.  She also is obese, she is currently prescribed Zanaflex and Norco. The patient endorsed the Epworth Sleepiness Scale at 14 points, with naps. The patient's weight 182 pounds with a height of 62 (inches), resulting in a BMI of 33.7 kg/m2.The patient's neck circumference measured 14 inches.  CURRENT MEDICATIONS: Norvasc, Aspirin, Diprolene, Lumigan ED, Voltaren topical, Allegra-D, Flonase, Furosemide, Amaryl, Hydrodiuril, Norco, Synthroid, Cozaar, Glucophage, Multivitamins, Estroven, Fish oil, Tylenol sinus, Miralax, K-Dur, Zantac, Ambien   PROCEDURE:  This is a multichannel digital polysomnogram utilizing the SomnoStar 11.2 system.  Electrodes and sensors were applied and monitored per AASM Specifications.   EEG, EOG, Chin and Limb EMG, were sampled at 200 Hz.  ECG, Snore and Nasal Pressure, Thermal Airflow, Respiratory Effort, CPAP Flow and Pressure, Oximetry was sampled at 50 Hz. Digital video and audio were recorded.      BASELINE STUDY: Lights Out was at 22:19 and Lights On at 05:03.  Total recording time (TRT) was 405 minutes, with a total sleep time (TST) of 308 minutes.   The patient's sleep latency was 31 minutes.  REM latency was 223 minutes.  The sleep efficiency was 76.0 %.     SLEEP ARCHITECTURE:  WASO (Wake after sleep onset) was 77 minutes.  There were 26 minutes in Stage N1, 227 minutes Stage N2, 39 minutes Stage N3 and 16 minutes in Stage REM.  The percentage of Stage N1 was 8.4%, Stage N2 was 73.7%, Stage N3 was 12.7% and Stage R (REM sleep) was 5.2%.   RESPIRATORY ANALYSIS:  There were a total of 2 respiratory events:  0 obstructive apneas, 0 central apneas and 0 mixed apneas with a total of 0 apneas and an apnea index (AI) of 0 /hour. There were 2 hypopneas with a hypopnea index of 0.4 /hour. The patient also had 0 respiratory event related arousals (RERAs).   The total APNEA/HYPOPNEA INDEX (AHI) was 0.4/hour and the total RESPIRATORY DISTURBANCE INDEX was 0.4 /hour.  0 events occurred in REM sleep and 4 events in NREM. The REM AHI was 0 /hour, versus a non-REM AHI of 0.4. The patient spent 32 minutes of total sleep time in the supine position and 276 minutes in non-supine. The supine AHI was 0.0 versus a non-supine AHI of 0.4.  OXYGEN SATURATION & C02:  The Wake baseline 02 saturation was 98%, with the lowest being 81%. Time spent below 89% saturation equaled 1 minute. Average End Tidal CO2 during sleep was not measured.   PERIODIC LIMB MOVEMENTS:  The patient had a total of 58 Periodic Limb Movements.  The Periodic Limb Movement (PLM) index was 11.3 and the PLM Arousal index was 1.6/hour. The arousals were noted as: 19 were spontaneous, 8 were associated with PLMs, and 0 were associated with respiratory events. Audio  and video analysis did not show any abnormal or unusual movements, behaviors, phonations or vocalizations. Nocturia times 3, mild to moderate Snoring was noted. EKG was in keeping with normal sinus rhythm (NSR).  Post-study, the patient indicated that bedroom was too cold.     IMPRESSION: No primary organic sleep disorder was identified, hypersomnia may be related to depression, hypothyroidism, sleep fragmentation by pain.  No supportive symptoms of narcolepsy aside form  daytime sleepiness.   1. No evidence of Obstructive Sleep Apnea (OSA) 2. Mild Periodic Limb Movement Disorder (PLMD) 3. Mild to Moderate Primary Snoring, not explaining Insomnia /Hypersomnia.   RECOMMENDATIONS: Back pain and hip pain have been identified by the patient as main obstacles to sustained sleep. Pain treatment with Dr. Nelva Bush and Pain management.   1. Avoid sedative-hypnotics, narcotics, alcohol and tobacco (as applicable). 2. Advise to lose weight by diet and exercise if not contraindicated (BMI). 3. Further information on sleep hygiene may be obtained from [employer] (www.sleepfoundation.org). 4. Consider dedicated sleep psychology referral for delayed sleep phase and sleep hygiene instruction.    5. A follow up appointment in the Sleep Clinic at [facility] is optional. 6.  The referring provider will be notified of the results.     I certify that I have reviewed the entire raw data recording prior to the issuance of this report in accordance with the Standards of Accreditation of the American Academy of Sleep Medicine (AASM)    Larey Seat, MD      07-28-2017  Diplomat, American Board of Psychiatry and Neurology  Diplomat, American Board of [employer] Director, [employer] [facility] [employer]

## 2017-07-29 ENCOUNTER — Telehealth: Payer: Self-pay | Admitting: Neurology

## 2017-07-29 NOTE — Telephone Encounter (Signed)
I called the patient and went over the sleep study results with her. I informed her that the patient did not have sleep apnea. I went over that she has mild and moderate snoring and some restlessness in her sleep but there was no indication that this was related to a sleep disorder. I have informed the patient that I will send a copy to her in the mail to review. The patient will follow up with PCP and pain management MD. Pt had another call from a MD come in and had to get off the phone with me quickly. Pt verbalized understanding.

## 2017-07-29 NOTE — Telephone Encounter (Signed)
Called patient to discuss sleep study results. No answer at this time. LVM for the patient to call back.   

## 2017-07-29 NOTE — Telephone Encounter (Signed)
-----   Message from Larey Seat, MD sent at 07/28/2017  5:24 PM EDT ----- IMPRESSION: No primary organic sleep disorder was identified,  hypersomnia may be related to depression, hypothyroidism, sleep  fragmentation by pain.  No supportive symptoms of narcolepsy aside form daytime  sleepiness.   1. No evidence of Obstructive Sleep Apnea (OSA) 2. Mild Periodic Limb Movement Disorder (PLMD) 3. Mild to Moderate Primary Snoring, not explaining Insomnia /Hypersomnia.   RECOMMENDATIONS: Back pain and hip pain have been identified by the patient as main obstacles to sustained sleep. Needs Pain treatment with Dr. Nelva Bush and Pain management.   1. Avoid sedative-hypnotics, narcotics, alcohol and tobacco (as  applicable). 2. Advise to lose weight by diet and exercise if not contraindicated (BMI). 3. Further information on sleep hygiene may be obtained from [employer] (www.sleepfoundation.org). 4. Consider dedicated sleep psychology referral for delayed sleep phase and sleep hygiene instruction.   5. A follow up appointment in the Sleep Clinic at [facility] is optional.

## 2017-07-29 NOTE — Telephone Encounter (Signed)
Pt is asking for a returned call from RN to discuss sleep study results

## 2017-08-05 DIAGNOSIS — M25551 Pain in right hip: Secondary | ICD-10-CM | POA: Diagnosis not present

## 2017-08-05 DIAGNOSIS — M25552 Pain in left hip: Secondary | ICD-10-CM | POA: Diagnosis not present

## 2017-08-05 DIAGNOSIS — M25562 Pain in left knee: Secondary | ICD-10-CM | POA: Diagnosis not present

## 2017-08-05 DIAGNOSIS — M25561 Pain in right knee: Secondary | ICD-10-CM | POA: Diagnosis not present

## 2017-08-10 ENCOUNTER — Encounter: Payer: Self-pay | Admitting: Sports Medicine

## 2017-08-10 ENCOUNTER — Other Ambulatory Visit: Payer: Self-pay | Admitting: Sports Medicine

## 2017-08-10 ENCOUNTER — Ambulatory Visit (INDEPENDENT_AMBULATORY_CARE_PROVIDER_SITE_OTHER): Payer: Medicare Other

## 2017-08-10 ENCOUNTER — Ambulatory Visit (INDEPENDENT_AMBULATORY_CARE_PROVIDER_SITE_OTHER): Payer: Medicare Other | Admitting: Sports Medicine

## 2017-08-10 DIAGNOSIS — M79671 Pain in right foot: Secondary | ICD-10-CM

## 2017-08-10 DIAGNOSIS — E1142 Type 2 diabetes mellitus with diabetic polyneuropathy: Secondary | ICD-10-CM | POA: Diagnosis not present

## 2017-08-10 DIAGNOSIS — M79672 Pain in left foot: Secondary | ICD-10-CM | POA: Diagnosis not present

## 2017-08-10 NOTE — Progress Notes (Signed)
Subjective: Joy Ward is a 77 y.o. female patient with history of diabetes who presents to office today complaining of discomfort to the balls that feel like a "weird sensation" walking on balled up sock on both feet that feels funny. Patient denies any new cramping, numbness, burning or tingling in the legs. FBS not checked last A1c 8.3.   Review of Systems  Neurological: Positive for sensory change.  All other systems reviewed and are negative.   Patient Active Problem List   Diagnosis Date Noted  . Elevated liver enzymes 10/15/2016  . Chest pain 04/22/2016  . Systolic murmur 28/36/6294  . Hypercalcemia 04/22/2016  . Hyperlipidemia 04/22/2016  . Baker's cyst of knee, right 04/21/2016  . HYPOKALEMIA 01/24/2010  . CONSTIPATION 12/23/2009  . INTERMITTENT VERTIGO 12/23/2009  . SINUSITIS, ACUTE 04/25/2009  . VAGINITIS, CANDIDAL 02/14/2009  . HIP PAIN, RIGHT 02/14/2009  . HYPERLIPIDEMIA, MIXED 01/01/2009  . BACK PAIN, LUMBAR, WITH RADICULOPATHY 12/25/2008  . KNEE PAIN, BILATERAL 12/18/2008  . TROCHANTERIC BURSITIS, BILATERAL 12/18/2008  . Disturbance in sleep behavior 12/18/2008  . ONYCHOMYCOSIS, TOENAILS 10/30/2008  . ENTERITIS, CLOSTRIDIUM DIFFICILE 10/15/2006  . Diabetes type 2, uncontrolled ([place]) 09/18/2006  . Essential hypertension 09/18/2006  . Allergic rhinitis 09/18/2006  . GOITER, MULTINODULAR 06/29/2006  . STROKE 04/27/2001   Current Outpatient Medications on File Prior to Visit  Medication Sig Dispense Refill  . ACCU-CHEK SOFTCLIX LANCETS lancets Use as instructed 300 each 3  . amLODipine (NORVASC) 5 MG tablet Take 1 tablet (5 mg total) by mouth daily. 90 tablet 3  . aspirin EC 81 MG EC tablet Take 1 tablet (81 mg total) by mouth daily. 30 tablet 0  . betamethasone dipropionate (DIPROLENE) 0.05 % cream Apply topically 2 (two) times daily.    . bimatoprost (LUMIGAN) 0.01 % SOLN Place 1 drop into the left eye at bedtime.    . Blood Glucose Monitoring Suppl  (ACCU-CHEK AVIVA PLUS) w/Device KIT use as directed to CHECK BLOOD SUGAR 1 kit 0  . buPROPion (WELLBUTRIN XL) 150 MG 24 hr tablet Take 1 tablet (150 mg total) by mouth daily. 30 tablet 5  . diclofenac sodium (VOLTAREN) 1 % GEL Apply 4 g topically 2 (two) times daily as needed (pain). 100 g 11  . fexofenadine-pseudoephedrine (ALLEGRA-D) 60-120 MG 12 hr tablet Take 1 tablet by mouth 2 (two) times daily. 60 tablet 2  . fluticasone (FLONASE) 50 MCG/ACT nasal spray Place 2 sprays into both nostrils daily. 16 g 6  . Foot Care Products [facility] GEL HEEL CUP MED/LG) MISC Wear in shoes all the time 2 each 0  . furosemide (LASIX) 20 MG tablet 1 tab by mouth daily if swelling of legs.  Eat a banana too. 30 tablet 0  . glimepiride (AMARYL) 2 MG tablet 1 tab by mouth twice daily with meals 180 tablet 3  . glucose blood (ACCU-CHEK AVIVA) test strip Check sugars twice daily before meals 300 each 3  . hydrochlorothiazide (HYDRODIURIL) 25 MG tablet 1 tab by mouth every morning. 90 tablet 3  . HYDROcodone-acetaminophen (NORCO) 10-325 MG tablet Take 1 tablet by mouth every 6 (six) hours as needed (pain).     Elmore Guise Devices (ACCU-CHEK SOFTCLIX) lancets Use as instructed 1 each 0  . levothyroxine (SYNTHROID, LEVOTHROID) 88 MCG tablet Take 1 tablet (88 mcg total) by mouth daily before breakfast. 90 tablet 3  . losartan (COZAAR) 100 MG tablet Take 1 tablet (100 mg total) by mouth daily. 90 tablet 3  . metFORMIN (GLUCOPHAGE-XR)  500 MG 24 hr tablet Take 1 tablet (500 mg total) by mouth daily with breakfast. 30 tablet 11  . Multiple Vitamin (MULTIVITAMIN WITH MINERALS) TABS tablet Take 1 tablet by mouth daily.    . Nutritional Supplements (ESTROVEN PO) Take 1 tablet by mouth daily.    . Omega-3 Fatty Acids (FISH OIL) 1000 MG CAPS Take 1,000 mg by mouth 2 (two) times daily.     Marland Kitchen Phenylephrine-APAP-Guaifenesin (TYLENOL SINUS SEVERE PO) Take 2 tablets by mouth daily as needed (sinus and congestion).     . polyethylene  glycol (MIRALAX / GLYCOLAX) packet Take 17 g by mouth daily. 100 each 3  . potassium chloride SA (K-DUR,KLOR-CON) 20 MEQ tablet Take 1 tablet (20 mEq total) by mouth daily. 90 tablet 3  . ranitidine (ZANTAC) 300 MG tablet Take 1 tablet (300 mg total) by mouth daily. 90 tablet 3  . zolpidem (AMBIEN) 5 MG tablet Take 5 mg by mouth at bedtime as needed for sleep.     No current facility-administered medications on file prior to visit.    Allergies  Allergen Reactions  . Clindamycin Hcl Other (See Comments)    REACTION: C. difficile colitis  . Zanaflex [Tizanidine Hcl] Anaphylaxis  . Metronidazole Hives  . Trazodone And Nefazodone Other (See Comments)    Sinus pains  . Statins Other (See Comments)    Side Effect: muscle pain Has tried Zocor, Crestor--she does not want to take a statin, period  . Ace Inhibitors Cough  . Adhesive [Tape] Itching, Rash and Other (See Comments)    Blistering of skin     No results found for this or any previous visit (from the past 2160 hour(s)).  Objective: General: Patient is awake, alert, and oriented x 3 and in no acute distress.  Integument: Skin is warm, dry and supple bilateral. Nails are within normal limits, 1-5 bilateral. No signs of infection. No open lesions or preulcerative lesions present bilateral. Remaining integument unremarkable.  Vasculature:  Dorsalis Pedis pulse 1/4 bilateral. Posterior Tibial pulse  1/4 bilateral. Capillary fill time <3 sec 1-5 bilateral. Scant hair growth to the level of the digits. Temperature gradient within normal limits. No varicosities present bilateral. No edema present bilateral.   Neurology: The patient has diminished sensation measured with a 5.07/10g Semmes Weinstein Monofilament at all pedal sites bilateral . Vibratory sensation diminished bilateral with tuning fork. No Babinski sign present bilateral.   Musculoskeletal: Asymptomatic bunion and pes planus pedal deformities noted bilateral. Muscular strength  5/5 in all lower extremity muscular groups bilateral without pain on range of motion . No tenderness with calf compression bilateral.  Xrays, bilateral, arthritis, bunion and midtarsal breach supportive of pes planus. No other acute findings.   Assessment and Plan: Problem List Items Addressed This Visit    None    Visit Diagnoses    Diabetic polyneuropathy associated with type 2 diabetes mellitus ([place])    -  Primary   Foot pain, bilateral       Relevant Orders   DG Foot Complete Left (Completed)      -Examined patient. -Xrays reviewed  -Discussed and educated patient on diabetic foot care, especially with  regards to the vascular, neurological and musculoskeletal systems.  -Stressed the importance of good glycemic control and the detriment of not  controlling glucose levels in relation to the foot. -Recommend vit B complex, topical aspercreme and better control of diabetes for neuropathy pain to feet -Patient refuses other neuropathy medication at this time -Recommend good  supportive shoes  -Answered all patient questions -Patient to return as needed  -Patient advised to call the office if any problems or questions arise in the meantime.  Landis Martins, DPM

## 2017-08-10 NOTE — Patient Instructions (Signed)
You have neuropathy Recommend to continue to take your multivitamin to help Recommend to use topical aspercreme which you can get over the coutner to use for any funny feeling or discomfort  Recommend for your dry skin to use [facility] Health Feet Cream after your shower which you can purchase over the counter  Diabetic Neuropathy Diabetic neuropathy is a nerve disease or nerve damage that is caused by diabetes mellitus. About half of all people with diabetes mellitus have some form of nerve damage. Nerve damage is more common in those who have had diabetes mellitus for many years and who generally have not had good control of their blood sugar (glucose) level. Diabetic neuropathy is a common complication of diabetes mellitus. There are three common types of diabetic neuropathy and a fourth type that is less common and less understood:  Peripheral neuropathy-This is the most common type of diabetic neuropathy. It causes damage to the nerves of the feet and legs first and then eventually the hands and arms. The damage affects the ability to sense touch.  Autonomic neuropathy-This type causes damage to the autonomic nervous system, which controls the following functions: ? Heartbeat. ? Body temperature. ? Blood pressure. ? Urination. ? Digestion. ? Sweating. ? Sexual function.  Focal neuropathy-Focal neuropathy can be painful and unpredictable and occurs most often in older adults with diabetes mellitus. It involves a specific nerve or one area and often comes on suddenly. It usually does not cause long-term problems.  Radiculoplexus neuropathy- Sometimes called lumbosacral radiculoplexus neuropathy, radiculoplexus neuropathy affects the nerves of the thighs, hips, buttocks, or legs. It is more common in people with type 2 diabetes mellitus and in older men. It is characterized by debilitating pain, weakness, and atrophy, usually in the thigh muscles.  What are the causes? The cause of  peripheral, autonomic, and focal neuropathies is diabetes mellitus that is uncontrolled and high glucose levels. The cause of radiculoplexus neuropathy is unknown. However, it is thought to be caused by inflammation related to uncontrolled glucose levels. What are the signs or symptoms? Peripheral Neuropathy Peripheral neuropathy develops slowly over time. When the nerves of the feet and legs no longer work there may be:  Burning, stabbing, or aching pain in the legs or feet.  Inability to feel pressure or pain in your feet. This can lead to: ? Thick calluses over pressure areas. ? Pressure sores. ? Ulcers.  Foot deformities.  Reduced ability to feel temperature changes.  Muscle weakness.  Autonomic Neuropathy The symptoms of autonomic neuropathy vary depending on which nerves are affected. Symptoms may include:  Problems with digestion, such as: ? Feeling sick to your stomach (nausea). ? Vomiting. ? Bloating. ? Constipation. ? Diarrhea. ? Abdominal pain.  Difficulty with urination. This occurs if you lose your ability to sense when your bladder is full. Problems include: ? Urine leakage (incontinence). ? Inability to empty your bladder completely (retention).  Rapid or irregular heartbeat (palpitations).  Blood pressure drops when you stand up (orthostatic hypotension). When you stand up you may feel: ? Dizzy. ? Weak. ? Faint.  In men, inability to attain and maintain an erection.  In women, vaginal dryness and problems with decreased sexual desire and arousal.  Problems with body temperature regulation.  Increased or decreased sweating.  Focal Neuropathy  Abnormal eye movements or abnormal alignment of both eyes.  Weakness in the wrist.  Foot drop. This results in an inability to lift the foot properly and abnormal walking or foot movement.  Paralysis  on one side of your face (Bell palsy).  Chest or abdominal pain. Radiculoplexus Neuropathy  Sudden,  severe pain in your hip, thigh, or buttocks.  Weakness and wasting of thigh muscles.  Difficulty rising from a seated position.  Abdominal swelling.  Unexplained weight loss (usually more than 10 lb [4.5 kg]). How is this diagnosed? Peripheral Neuropathy Your senses may be tested. Sensory function testing can be done with:  A light touch using a monofilament.  A vibration with tuning fork.  A sharp sensation with a pin prick.  Other tests that can help diagnose neuropathy are:  Nerve conduction velocity. This test checks the transmission of an electrical current through a nerve.  Electromyography. This shows how muscles respond to electrical signals transmitted by nearby nerves.  Quantitative sensory testing. This is used to assess how your nerves respond to vibrations and changes in temperature.  Autonomic Neuropathy Diagnosis is often based on reported symptoms. Tell your health care provider if you experience:  Dizziness.  Constipation.  Diarrhea.  Inappropriate urination or inability to urinate.  Inability to get or maintain an erection.  Tests that may be done include:  Electrocardiography or Holter monitor. These are tests that can help show problems with the heart rate or heart rhythm.  An X-ray exam may be done.  Focal Neuropathy Diagnosis is made based on your symptoms and what your health care provider finds during your exam. Other tests may be done. They may include:  Nerve conduction velocities. This checks the transmission of electrical current through a nerve.  Electromyography. This shows how muscles respond to electrical signals transmitted by nearby nerves.  Quantitative sensory testing. This test is used to assess how your nerves respond to vibration and changes in temperature.  Radiculoplexus Neuropathy  Often the first thing is to eliminate any other issue or problems that might be the cause, as there is no standard test for  diagnosis.  X-ray exam of your spine and lumbar region.  Spinal tap to rule out cancer.  MRI to rule out other lesions. How is this treated? Once nerve damage occurs, it cannot be reversed. The goal of treatment is to keep the disease or nerve damage from getting worse and affecting more nerve fibers. Controlling your blood glucose level is the key. Most people with radiculoplexus neuropathy see at least a partial improvement over time. You will need to keep your blood glucose and HbA1c levels in the target range determined by your health care provider. Things that help control blood glucose levels include:  Blood glucose monitoring.  Meal planning.  Physical activity.  Diabetes medicine.  Over time, maintaining lower blood glucose levels helps lessen symptoms. Sometimes, prescription pain medicine is needed. Follow these instructions at home:  Do not smoke.  Keep your blood glucose level in the range that you and your health care provider have determined acceptable for you.  Keep your blood pressure level in the range that you and your health care provider have determined acceptable for you.  Eat a well-balanced diet.  Be physically active every day. Include strength training and balance exercises.  Protect your feet. ? Check your feet every day for sores, cuts, blisters, or signs of infection. ? Wear padded socks and supportive shoes. Use orthotic inserts, if necessary. ? Regularly check the insides of your shoes for worn spots. Make sure there are no rocks or other items inside your shoes before you put them on. Contact a health care provider if:  You have  burning, stabbing, or aching pain in the legs or feet.  You are unable to feel pressure or pain in your feet.  You develop problems with digestion such as: ? Nausea. ? Vomiting. ? Bloating. ? Constipation. ? Diarrhea. ? Abdominal pain.  You have difficulty with urination, such  as: ? Incontinence. ? Retention.  You have palpitations.  You develop orthostatic hypotension. When you stand up you may feel: ? Dizzy. ? Weak. ? Faint.  You cannot attain and maintain an erection (in men).  You have vaginal dryness and problems with decreased sexual desire and arousal (in women).  You have severe pain in your thighs, legs, or buttocks.  You have unexplained weight loss. This information is not intended to replace advice given to you by your health care provider. Make sure you discuss any questions you have with your health care provider. Document Released: 06/22/2001 Document Revised: 09/19/2015 Document Reviewed: 09/22/2012 Elsevier Interactive Patient Education  2017 [employer].

## 2017-08-18 DIAGNOSIS — E119 Type 2 diabetes mellitus without complications: Secondary | ICD-10-CM | POA: Diagnosis not present

## 2017-08-18 DIAGNOSIS — H2513 Age-related nuclear cataract, bilateral: Secondary | ICD-10-CM | POA: Diagnosis not present

## 2017-08-18 DIAGNOSIS — H401232 Low-tension glaucoma, bilateral, moderate stage: Secondary | ICD-10-CM | POA: Diagnosis not present

## 2017-08-21 DIAGNOSIS — G894 Chronic pain syndrome: Secondary | ICD-10-CM | POA: Diagnosis not present

## 2017-08-21 DIAGNOSIS — M961 Postlaminectomy syndrome, not elsewhere classified: Secondary | ICD-10-CM | POA: Diagnosis not present

## 2017-08-21 DIAGNOSIS — M5136 Other intervertebral disc degeneration, lumbar region: Secondary | ICD-10-CM | POA: Diagnosis not present

## 2017-08-23 ENCOUNTER — Other Ambulatory Visit: Payer: Self-pay | Admitting: Internal Medicine

## 2017-09-06 ENCOUNTER — Encounter: Payer: Medicare Other | Admitting: Psychology

## 2017-09-08 ENCOUNTER — Other Ambulatory Visit: Payer: Self-pay | Admitting: Internal Medicine

## 2017-09-08 NOTE — Telephone Encounter (Signed)
To Dr. Mulberry for approval 

## 2017-09-13 DIAGNOSIS — I10 Essential (primary) hypertension: Secondary | ICD-10-CM | POA: Diagnosis not present

## 2017-09-13 DIAGNOSIS — G8929 Other chronic pain: Secondary | ICD-10-CM | POA: Diagnosis not present

## 2017-09-13 DIAGNOSIS — R413 Other amnesia: Secondary | ICD-10-CM | POA: Diagnosis not present

## 2017-09-13 DIAGNOSIS — R300 Dysuria: Secondary | ICD-10-CM | POA: Diagnosis not present

## 2017-09-13 DIAGNOSIS — E1142 Type 2 diabetes mellitus with diabetic polyneuropathy: Secondary | ICD-10-CM | POA: Diagnosis not present

## 2017-09-13 DIAGNOSIS — E039 Hypothyroidism, unspecified: Secondary | ICD-10-CM | POA: Diagnosis not present

## 2017-09-13 DIAGNOSIS — M940 Chondrocostal junction syndrome [Tietze]: Secondary | ICD-10-CM | POA: Diagnosis not present

## 2017-09-22 DIAGNOSIS — M533 Sacrococcygeal disorders, not elsewhere classified: Secondary | ICD-10-CM | POA: Diagnosis not present

## 2017-09-22 DIAGNOSIS — G894 Chronic pain syndrome: Secondary | ICD-10-CM | POA: Diagnosis not present

## 2017-10-07 DIAGNOSIS — M533 Sacrococcygeal disorders, not elsewhere classified: Secondary | ICD-10-CM | POA: Diagnosis not present

## 2017-10-08 DIAGNOSIS — H401232 Low-tension glaucoma, bilateral, moderate stage: Secondary | ICD-10-CM | POA: Diagnosis not present

## 2017-10-08 DIAGNOSIS — H04123 Dry eye syndrome of bilateral lacrimal glands: Secondary | ICD-10-CM | POA: Diagnosis not present

## 2017-10-12 ENCOUNTER — Encounter: Payer: Medicare Other | Attending: Psychology | Admitting: Psychology

## 2017-10-12 DIAGNOSIS — Z833 Family history of diabetes mellitus: Secondary | ICD-10-CM | POA: Diagnosis not present

## 2017-10-12 DIAGNOSIS — I10 Essential (primary) hypertension: Secondary | ICD-10-CM | POA: Insufficient documentation

## 2017-10-12 DIAGNOSIS — Z8249 Family history of ischemic heart disease and other diseases of the circulatory system: Secondary | ICD-10-CM | POA: Diagnosis not present

## 2017-10-12 DIAGNOSIS — R413 Other amnesia: Secondary | ICD-10-CM

## 2017-10-12 DIAGNOSIS — E039 Hypothyroidism, unspecified: Secondary | ICD-10-CM | POA: Diagnosis not present

## 2017-10-12 DIAGNOSIS — F419 Anxiety disorder, unspecified: Secondary | ICD-10-CM | POA: Insufficient documentation

## 2017-10-12 DIAGNOSIS — G4700 Insomnia, unspecified: Secondary | ICD-10-CM | POA: Diagnosis not present

## 2017-10-12 DIAGNOSIS — M961 Postlaminectomy syndrome, not elsewhere classified: Secondary | ICD-10-CM | POA: Insufficient documentation

## 2017-10-12 DIAGNOSIS — E785 Hyperlipidemia, unspecified: Secondary | ICD-10-CM | POA: Insufficient documentation

## 2017-10-12 DIAGNOSIS — G894 Chronic pain syndrome: Secondary | ICD-10-CM

## 2017-10-12 DIAGNOSIS — Z8673 Personal history of transient ischemic attack (TIA), and cerebral infarction without residual deficits: Secondary | ICD-10-CM | POA: Insufficient documentation

## 2017-10-12 DIAGNOSIS — E119 Type 2 diabetes mellitus without complications: Secondary | ICD-10-CM | POA: Insufficient documentation

## 2017-10-14 DIAGNOSIS — R413 Other amnesia: Secondary | ICD-10-CM | POA: Diagnosis not present

## 2017-10-22 ENCOUNTER — Other Ambulatory Visit: Payer: Self-pay | Admitting: Internal Medicine

## 2017-11-02 DIAGNOSIS — M47816 Spondylosis without myelopathy or radiculopathy, lumbar region: Secondary | ICD-10-CM | POA: Diagnosis not present

## 2017-11-02 DIAGNOSIS — G894 Chronic pain syndrome: Secondary | ICD-10-CM | POA: Diagnosis not present

## 2017-11-17 DIAGNOSIS — H04123 Dry eye syndrome of bilateral lacrimal glands: Secondary | ICD-10-CM | POA: Diagnosis not present

## 2017-11-17 DIAGNOSIS — H401232 Low-tension glaucoma, bilateral, moderate stage: Secondary | ICD-10-CM | POA: Diagnosis not present

## 2017-11-18 ENCOUNTER — Telehealth: Payer: Self-pay | Admitting: Psychology

## 2017-11-18 DIAGNOSIS — E1142 Type 2 diabetes mellitus with diabetic polyneuropathy: Secondary | ICD-10-CM | POA: Diagnosis not present

## 2017-11-18 DIAGNOSIS — Z79899 Other long term (current) drug therapy: Secondary | ICD-10-CM | POA: Diagnosis not present

## 2017-11-18 DIAGNOSIS — G990 Autonomic neuropathy in diseases classified elsewhere: Secondary | ICD-10-CM | POA: Diagnosis not present

## 2017-11-18 DIAGNOSIS — F5101 Primary insomnia: Secondary | ICD-10-CM | POA: Diagnosis not present

## 2017-11-18 NOTE — Telephone Encounter (Signed)
Close note

## 2017-11-20 ENCOUNTER — Encounter: Payer: Self-pay | Admitting: Psychology
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
## 2017-11-29 ENCOUNTER — Ambulatory Visit: Payer: Medicare Other | Admitting: Psychology

## 2017-11-29 ENCOUNTER — Encounter

## 2017-12-09 ENCOUNTER — Encounter: Payer: Medicare Other | Attending: Psychology | Admitting: Psychology

## 2017-12-09 ENCOUNTER — Encounter

## 2017-12-09 DIAGNOSIS — G4700 Insomnia, unspecified: Secondary | ICD-10-CM

## 2017-12-09 DIAGNOSIS — E039 Hypothyroidism, unspecified: Secondary | ICD-10-CM | POA: Insufficient documentation

## 2017-12-09 DIAGNOSIS — G459 Transient cerebral ischemic attack, unspecified: Secondary | ICD-10-CM | POA: Insufficient documentation

## 2017-12-09 DIAGNOSIS — Z833 Family history of diabetes mellitus: Secondary | ICD-10-CM | POA: Insufficient documentation

## 2017-12-09 DIAGNOSIS — M961 Postlaminectomy syndrome, not elsewhere classified: Secondary | ICD-10-CM | POA: Diagnosis not present

## 2017-12-09 DIAGNOSIS — R413 Other amnesia: Secondary | ICD-10-CM | POA: Diagnosis not present

## 2017-12-09 DIAGNOSIS — G894 Chronic pain syndrome: Secondary | ICD-10-CM | POA: Insufficient documentation

## 2017-12-09 DIAGNOSIS — E119 Type 2 diabetes mellitus without complications: Secondary | ICD-10-CM | POA: Insufficient documentation

## 2017-12-09 DIAGNOSIS — F419 Anxiety disorder, unspecified: Secondary | ICD-10-CM | POA: Insufficient documentation

## 2017-12-09 DIAGNOSIS — I69911 Memory deficit following unspecified cerebrovascular disease: Secondary | ICD-10-CM | POA: Insufficient documentation

## 2017-12-09 DIAGNOSIS — E785 Hyperlipidemia, unspecified: Secondary | ICD-10-CM | POA: Insufficient documentation

## 2017-12-09 DIAGNOSIS — I10 Essential (primary) hypertension: Secondary | ICD-10-CM | POA: Insufficient documentation

## 2017-12-09 DIAGNOSIS — Z8249 Family history of ischemic heart disease and other diseases of the circulatory system: Secondary | ICD-10-CM | POA: Insufficient documentation

## 2017-12-20 ENCOUNTER — Encounter: Payer: Self-pay | Admitting: Psychology

## 2017-12-20 ENCOUNTER — Encounter (HOSPITAL_BASED_OUTPATIENT_CLINIC_OR_DEPARTMENT_OTHER): Payer: Medicare Other | Admitting: Psychology

## 2017-12-20 DIAGNOSIS — G4700 Insomnia, unspecified: Secondary | ICD-10-CM

## 2017-12-20 DIAGNOSIS — F419 Anxiety disorder, unspecified: Secondary | ICD-10-CM | POA: Diagnosis not present

## 2017-12-20 DIAGNOSIS — Z833 Family history of diabetes mellitus: Secondary | ICD-10-CM | POA: Diagnosis not present

## 2017-12-20 DIAGNOSIS — R413 Other amnesia: Secondary | ICD-10-CM

## 2017-12-20 DIAGNOSIS — M961 Postlaminectomy syndrome, not elsewhere classified: Secondary | ICD-10-CM | POA: Diagnosis not present

## 2017-12-20 DIAGNOSIS — I10 Essential (primary) hypertension: Secondary | ICD-10-CM | POA: Diagnosis not present

## 2017-12-20 DIAGNOSIS — Z8249 Family history of ischemic heart disease and other diseases of the circulatory system: Secondary | ICD-10-CM | POA: Diagnosis not present

## 2017-12-20 DIAGNOSIS — E785 Hyperlipidemia, unspecified: Secondary | ICD-10-CM | POA: Diagnosis not present

## 2017-12-20 DIAGNOSIS — G894 Chronic pain syndrome: Secondary | ICD-10-CM

## 2017-12-20 DIAGNOSIS — I679 Cerebrovascular disease, unspecified: Secondary | ICD-10-CM | POA: Diagnosis not present

## 2017-12-20 DIAGNOSIS — E119 Type 2 diabetes mellitus without complications: Secondary | ICD-10-CM | POA: Diagnosis not present

## 2017-12-20 DIAGNOSIS — G459 Transient cerebral ischemic attack, unspecified: Secondary | ICD-10-CM | POA: Diagnosis not present

## 2017-12-20 DIAGNOSIS — E039 Hypothyroidism, unspecified: Secondary | ICD-10-CM | POA: Diagnosis not present

## 2017-12-20 DIAGNOSIS — I69911 Memory deficit following unspecified cerebrovascular disease: Secondary | ICD-10-CM | POA: Diagnosis not present
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
## 2017-12-24 ENCOUNTER — Encounter (HOSPITAL_BASED_OUTPATIENT_CLINIC_OR_DEPARTMENT_OTHER): Payer: Medicare Other | Admitting: Psychology

## 2017-12-24 ENCOUNTER — Encounter: Payer: Self-pay | Admitting: Psychology

## 2017-12-24 DIAGNOSIS — M961 Postlaminectomy syndrome, not elsewhere classified: Secondary | ICD-10-CM | POA: Diagnosis not present

## 2017-12-24 DIAGNOSIS — G4700 Insomnia, unspecified: Secondary | ICD-10-CM | POA: Diagnosis not present

## 2017-12-24 DIAGNOSIS — G459 Transient cerebral ischemic attack, unspecified: Secondary | ICD-10-CM | POA: Diagnosis not present

## 2017-12-24 DIAGNOSIS — I679 Cerebrovascular disease, unspecified: Secondary | ICD-10-CM | POA: Diagnosis not present

## 2017-12-24 DIAGNOSIS — R413 Other amnesia: Secondary | ICD-10-CM

## 2017-12-24 DIAGNOSIS — I69911 Memory deficit following unspecified cerebrovascular disease: Secondary | ICD-10-CM | POA: Diagnosis not present

## 2017-12-24 DIAGNOSIS — F419 Anxiety disorder, unspecified: Secondary | ICD-10-CM | POA: Diagnosis not present

## 2017-12-24 DIAGNOSIS — G894 Chronic pain syndrome: Secondary | ICD-10-CM | POA: Diagnosis not present

## 2017-12-24 NOTE — Progress Notes (Signed)
Today I provided feedback regarding the results of the recent neuropsychological evaluation.  The patient and her daughter were available for this appointment.  We talked about the need for her to address issues related to her diabetes and stay as on top of this primary medical condition as she could.  Diabetes is probably playing a significant role in the development and worsening of any small vessel disease which is likely the etiological factor leading to her cognitive deficits.  Below you will find a copy of the summary from the neuropsychological evaluation for more details.  Impression/Diagnosis:                     The results of the current neuropsychological evaluation are consistent with significant neuropsychological deficits.  While the patient has a long-term history of chronic pain, sleep disturbance and stress due to psychosocial issues these deficits are much greater than would be explained by these 3 current factors.  The patient's delayed memory deficits were severely impaired and indicative of significant neuropsychological decline.  The patient and her family reports that they have seen a progressive worsening over the past 12 months.  There is a family history of Alzheimer's diagnosis and has been diagnosed in her sister.  While the patient has had a prior cerebrovascular event there does not appear to be any significant recent major stroke events.  However, CT scan from 2018 were consistent with significant cortical atrophy with strong indications of chronic small vessel disease and white matter ischemic demyelinization.  While given the family history of Alzheimer's diagnoses we cannot rule this out, the patient's excellent visual spatial and visual constructional abilities in contrast to the pattern of deficits is more consistent with subcortical issues rather than cortical dementia such as Alzheimer's.  I would suspect that progressing atrophy and significant small vessel disease producing  white matter ischemic demyelinization of the likely culprit from an etiological factor.  However, this pattern is likely to persist in this does suggest a progressive subcortical dementia related to cerebrovascular changes.

## 2017-12-29 ENCOUNTER — Other Ambulatory Visit: Payer: Self-pay | Admitting: Internal Medicine

## 2017-12-29 ENCOUNTER — Encounter: Payer: Self-pay | Admitting: Internal Medicine

## 2017-12-29 ENCOUNTER — Ambulatory Visit (INDEPENDENT_AMBULATORY_CARE_PROVIDER_SITE_OTHER): Payer: Medicare Other | Admitting: Internal Medicine

## 2017-12-29 VITALS — BP 130/80 | HR 80 | Resp 12 | Ht 62.25 in | Wt 176.0 lb

## 2017-12-29 DIAGNOSIS — E1165 Type 2 diabetes mellitus with hyperglycemia: Secondary | ICD-10-CM

## 2017-12-29 DIAGNOSIS — G479 Sleep disorder, unspecified: Secondary | ICD-10-CM | POA: Diagnosis not present

## 2017-12-29 DIAGNOSIS — R413 Other amnesia: Secondary | ICD-10-CM

## 2017-12-29 DIAGNOSIS — I10 Essential (primary) hypertension: Secondary | ICD-10-CM | POA: Diagnosis not present

## 2017-12-29 DIAGNOSIS — R609 Edema, unspecified: Secondary | ICD-10-CM | POA: Diagnosis not present

## 2017-12-29 DIAGNOSIS — E1142 Type 2 diabetes mellitus with diabetic polyneuropathy: Secondary | ICD-10-CM | POA: Diagnosis not present

## 2017-12-29 DIAGNOSIS — E782 Mixed hyperlipidemia: Secondary | ICD-10-CM

## 2017-12-29 MED ORDER — FUROSEMIDE 20 MG PO TABS: ORAL_TABLET | ORAL | 0 refills | Status: DC

## 2017-12-29 MED ORDER — ROSUVASTATIN CALCIUM 10 MG PO TABS: 10.0000 mg | ORAL_TABLET | Freq: Every day | ORAL | 11 refills | Status: DC

## 2017-12-29 MED ORDER — GABAPENTIN 100 MG PO CAPS: ORAL_CAPSULE | ORAL | 11 refills | Status: DC

## 2017-12-29 MED ORDER — METFORMIN HCL ER 500 MG PO TB24: ORAL_TABLET | ORAL | 3 refills | Status: DC

## 2017-12-29 NOTE — Patient Instructions (Addendum)
Crestor (Rosuvastatin) is for cholesterol.  It tends to NOT cause the muscle pain  Gabapentin is for foot numbness.  It may help with getting to sleep.  Furosemide is for leg swelling.  Take in the morning.  Eat half a banana a day  Increasing Metformin to 1 tab twice daily with first and last meals of day.  If you ever get the chance to go to [facility], the musical--GO!!!! It's great!

## 2017-12-29 NOTE — Progress Notes (Signed)
Subjective:    Patient ID: Joy Ward, female    DOB: Nov 04, 1940, 77 y.o.   MRN: [id number]  HPI  1.  Memory and cognitive deficits:  Finally able to get into Neuropsych, Dr. Sima Matas.  Felt to be secondary to subcortical small vessel disease.  Needs to work on chronic diseases that can lead to progression.  2.  Bilateral leg pain:  Numbness in feet and aching in legs.  Was seen by podiatry, Dr. Cannon Kettle and set up with orthopedic shoes.   She accidentally left them with her son in [place] a month ago and has not been able to get them sent to her.    3.  Poor sleep:  Still sleeping a lot through the day.  Not interested in trying to change her routine with sleep at this time.    4.  Leg swelling:  And aching.  Would like to try Furosemide again.  5.  Hyperlipidemia:  Refused a statin in November.  Has had muscle aches with some.  She cannot recall which.  Would consider Crestor.    .    Current Meds  Medication Sig  . ACCU-CHEK SOFTCLIX LANCETS lancets Use as instructed  . amLODipine (NORVASC) 5 MG tablet Take 1 tablet (5 mg total) by mouth daily.  . betamethasone dipropionate (DIPROLENE) 0.05 % cream Apply topically 2 (two) times daily.  . bimatoprost (LUMIGAN) 0.01 % SOLN Place 1 drop into the left eye at bedtime.  . Blood Glucose Monitoring Suppl (ACCU-CHEK AVIVA PLUS) w/Device KIT use as directed to CHECK BLOOD SUGAR  . diclofenac sodium (VOLTAREN) 1 % GEL Apply 4 g topically 2 (two) times daily as needed (pain).  . fluticasone (FLONASE) 50 MCG/ACT nasal spray INSTILL 2 SPRAYS INTO EACH NOSTRIL ONCE DAILY  . Foot Care Products [facility] GEL HEEL CUP MED/LG) MISC Wear in shoes all the time  . glimepiride (AMARYL) 2 MG tablet 1 tab by mouth twice daily with meals  . glucose blood (ACCU-CHEK AVIVA) test strip Check sugars twice daily before meals  . hydrochlorothiazide (HYDRODIURIL) 25 MG tablet 1 tab by mouth every morning.  Marland Kitchen HYDROcodone-acetaminophen (NORCO) 10-325 MG tablet Take 1  tablet by mouth every 6 (six) hours as needed (pain).   Elmore Guise Devices (ACCU-CHEK SOFTCLIX) lancets Use as instructed  . levothyroxine (SYNTHROID, LEVOTHROID) 88 MCG tablet Take 1 tablet (88 mcg total) by mouth daily before breakfast.  . losartan (COZAAR) 100 MG tablet TAKE 1 TABLET BY MOUTH ONCE DAILY  . metFORMIN (GLUCOPHAGE-XR) 500 MG 24 hr tablet TAKE 1 TABLET BY MOUTH ONCE DAILY WITH BREAKFAST  . Multiple Vitamin (MULTIVITAMIN WITH MINERALS) TABS tablet Take 1 tablet by mouth daily.  . Omega-3 Fatty Acids (FISH OIL) 1000 MG CAPS Take 1,000 mg by mouth 2 (two) times daily.   Marland Kitchen Phenylephrine-APAP-Guaifenesin (TYLENOL SINUS SEVERE PO) Take 2 tablets by mouth daily as needed (sinus and congestion).   . polyethylene glycol (MIRALAX / GLYCOLAX) packet Take 17 g by mouth daily.  . potassium chloride SA (K-DUR,KLOR-CON) 20 MEQ tablet Take 1 tablet (20 mEq total) by mouth daily.  . ranitidine (ZANTAC) 300 MG tablet Take 1 tablet (300 mg total) by mouth daily.    Allergies  Allergen Reactions  . Clindamycin Hcl Other (See Comments)    REACTION: C. difficile colitis  . Zanaflex [Tizanidine Hcl] Anaphylaxis  . Metronidazole Hives  . Trazodone And Nefazodone Other (See Comments)    Sinus pains  . Statins Other (See Comments)  Side Effect: muscle pain Has tried Zocor, Crestor--she does not want to take a statin, period  . Ace Inhibitors Cough  . Adhesive [Tape] Itching, Rash and Other (See Comments)    Blistering of skin     Review of Systems     Objective:   Physical Exam   NAD Lungs:  CTA CV:  RRR with normal S1 and S2, No S3, S4 or murmur.  Radial and DP pulses normal and equal LE:  Mild pitting edema over pretib area bilaterally.      Assessment & Plan:  1. Memory and cognitive deficits:  Small vessel disease.  Improve chronic disease control  2.  DM:  Increase Metformin ER to 500 mg twice daily to improve control.  Not clear what last A1C was with her more recent primary  care, but was as high as 8.3% with A1C 03/23/17  3.  Hypertension: controlled  4.  Leg and foot pain:  Gabapentin 100 mg cap at bedtime.  Will need to try slow titration.  5.  LE edema:  Furosemide 20 mg daily in morning.  BMP in 1 week.  6.  Hyperlipidemia:  Willing to try Rosuvastatin 10 mg daily.  Fasting labs in 6 weeks:  FLP, CBC, CMP, A1C and me 2 days later. Call if develops muscle pain.

## 2018-01-05 ENCOUNTER — Other Ambulatory Visit (INDEPENDENT_AMBULATORY_CARE_PROVIDER_SITE_OTHER): Payer: Medicare Other

## 2018-01-05 DIAGNOSIS — Z79899 Other long term (current) drug therapy: Secondary | ICD-10-CM | POA: Diagnosis not present

## 2018-01-06 LAB — BASIC METABOLIC PANEL
BUN/Creatinine Ratio: 22 (ref 12–28)
BUN: 14 mg/dL (ref 8–27)
CALCIUM: 9.8 mg/dL (ref 8.7–10.3)
CHLORIDE: 104 mmol/L (ref 96–106)
CO2: 21 mmol/L (ref 20–29)
Creatinine, Ser: 0.64 mg/dL (ref 0.57–1.00)
GFR calc non Af Amer: 87 mL/min/{1.73_m2} (ref >59)
GFR, EST AFRICAN AMERICAN: 100 mL/min/{1.73_m2} (ref >59)
Glucose: 190 mg/dL — ABNORMAL HIGH (ref 65–99)
Potassium: 3.8 mmol/L (ref 3.5–5.2)
Sodium: 139 mmol/L (ref 134–144)
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
## 2018-01-13 DIAGNOSIS — M1711 Unilateral primary osteoarthritis, right knee: Secondary | ICD-10-CM | POA: Diagnosis not present

## 2018-01-13 DIAGNOSIS — M25562 Pain in left knee: Secondary | ICD-10-CM | POA: Diagnosis not present

## 2018-01-13 DIAGNOSIS — M1712 Unilateral primary osteoarthritis, left knee: Secondary | ICD-10-CM | POA: Diagnosis not present

## 2018-01-14 DIAGNOSIS — H04123 Dry eye syndrome of bilateral lacrimal glands: Secondary | ICD-10-CM | POA: Diagnosis not present

## 2018-01-14 DIAGNOSIS — H401232 Low-tension glaucoma, bilateral, moderate stage: Secondary | ICD-10-CM | POA: Diagnosis not present

## 2018-01-21 DIAGNOSIS — M1712 Unilateral primary osteoarthritis, left knee: Secondary | ICD-10-CM | POA: Diagnosis not present

## 2018-01-22 ENCOUNTER — Other Ambulatory Visit: Payer: Self-pay | Admitting: Internal Medicine

## 2018-01-22 DIAGNOSIS — Z23 Encounter for immunization: Secondary | ICD-10-CM | POA: Diagnosis not present

## 2018-01-28 ENCOUNTER — Other Ambulatory Visit: Payer: Self-pay | Admitting: Internal Medicine

## 2018-02-02 DIAGNOSIS — H401232 Low-tension glaucoma, bilateral, moderate stage: Secondary | ICD-10-CM | POA: Diagnosis not present

## 2018-02-02 DIAGNOSIS — H04123 Dry eye syndrome of bilateral lacrimal glands: Secondary | ICD-10-CM | POA: Diagnosis not present

## 2018-02-09 ENCOUNTER — Other Ambulatory Visit: Payer: Medicare Other

## 2018-02-12 DIAGNOSIS — M47816 Spondylosis without myelopathy or radiculopathy, lumbar region: Secondary | ICD-10-CM | POA: Diagnosis not present

## 2018-02-15 ENCOUNTER — Ambulatory Visit: Payer: Medicare Other | Admitting: Internal Medicine

## 2018-03-03 DIAGNOSIS — G894 Chronic pain syndrome: Secondary | ICD-10-CM | POA: Diagnosis not present

## 2018-03-04 ENCOUNTER — Telehealth: Payer: Self-pay | Admitting: Internal Medicine

## 2018-03-04 ENCOUNTER — Other Ambulatory Visit: Payer: Self-pay | Admitting: Internal Medicine

## 2018-03-04 ENCOUNTER — Encounter: Payer: Self-pay | Admitting: Internal Medicine

## 2018-03-04 MED ORDER — HYDROCHLOROTHIAZIDE 25 MG PO TABS: ORAL_TABLET | ORAL | 0 refills | Status: DC

## 2018-03-04 MED ORDER — AMLODIPINE BESYLATE 5 MG PO TABS: 5.0000 mg | ORAL_TABLET | Freq: Every day | ORAL | 0 refills | Status: DC

## 2018-03-04 MED ORDER — BIMATOPROST 0.01 % OP SOLN
1.0000 [drp] | Freq: Every day | OPHTHALMIC | 0 refills | Status: DC
Start: 2018-03-04 — End: 2019-08-06

## 2018-03-04 MED ORDER — ACCU-CHEK AVIVA PLUS W/DEVICE KIT: PACK | 0 refills | Status: DC

## 2018-03-04 MED ORDER — LEVOTHYROXINE SODIUM 88 MCG PO TABS: 88.0000 ug | ORAL_TABLET | Freq: Every day | ORAL | 0 refills | Status: DC

## 2018-03-04 MED ORDER — RANITIDINE HCL 300 MG PO TABS: 300.0000 mg | ORAL_TABLET | Freq: Every day | ORAL | 0 refills | Status: DC

## 2018-03-04 MED ORDER — DICLOFENAC SODIUM 1 % TD GEL: 4.0000 g | Freq: Two times a day (BID) | TRANSDERMAL | 0 refills | Status: DC | PRN

## 2018-03-04 MED ORDER — ROSUVASTATIN CALCIUM 10 MG PO TABS: 10.0000 mg | ORAL_TABLET | Freq: Every day | ORAL | 0 refills | Status: DC

## 2018-03-04 MED ORDER — FUROSEMIDE 20 MG PO TABS: ORAL_TABLET | ORAL | 0 refills | Status: DC

## 2018-03-04 MED ORDER — METFORMIN HCL ER 500 MG PO TB24: ORAL_TABLET | ORAL | 0 refills | Status: DC

## 2018-03-04 MED ORDER — ACCU-CHEK SOFTCLIX LANCETS MISC: 0 refills | Status: DC

## 2018-03-04 MED ORDER — FLUTICASONE PROPIONATE 50 MCG/ACT NA SUSP: NASAL | 2 refills | Status: DC

## 2018-03-04 MED ORDER — POTASSIUM CHLORIDE ER 20 MEQ PO TBCR: 1.0000 | EXTENDED_RELEASE_TABLET | Freq: Every day | ORAL | 0 refills | Status: DC

## 2018-03-04 MED ORDER — GLUCOSE BLOOD VI STRP: ORAL_STRIP | 0 refills | Status: DC

## 2018-03-04 MED ORDER — GLIMEPIRIDE 2 MG PO TABS: ORAL_TABLET | ORAL | 0 refills | Status: DC

## 2018-03-04 MED ORDER — GABAPENTIN 100 MG PO CAPS: ORAL_CAPSULE | ORAL | 0 refills | Status: DC

## 2018-03-04 MED ORDER — LOSARTAN POTASSIUM 100 MG PO TABS: 100.0000 mg | ORAL_TABLET | Freq: Every day | ORAL | 0 refills | Status: DC

## 2018-03-04 NOTE — Telephone Encounter (Signed)
Granddaughter picked up meds--listed what each was for and to compare to what she is actually taking. Was on hold for 30 minutes with pharmacy to clarify what she has actually been taking.  Pharmacy never picked up Patient has not followed up as recommended after starting new meds in September.

## 2018-03-04 NOTE — Telephone Encounter (Signed)
To dr. Amil Amen to refill Levothyroxine and print all prescriptions as patient moving out of town

## 2018-03-04 NOTE — Telephone Encounter (Signed)
Patient needs refill of SYNTHROID,LEVO(THROID 88 mg. tablet

## 2018-03-04 NOTE — Telephone Encounter (Signed)
Patient needs refill of levothyroxine (SYNTHROID, LEVOTHROID) 88 MCG tablet

## 2018-04-13 ENCOUNTER — Other Ambulatory Visit: Payer: Self-pay

## 2018-04-13 MED ORDER — GLUCOSE BLOOD VI STRP: ORAL_STRIP | 0 refills | Status: DC

## 2018-04-13 MED ORDER — ACCU-CHEK AVIVA PLUS W/DEVICE KIT: PACK | 0 refills | Status: DC

## 2018-04-13 MED ORDER — ACCU-CHEK SOFTCLIX LANCETS MISC: 0 refills | Status: DC

## 2018-04-22 DIAGNOSIS — E039 Hypothyroidism, unspecified: Secondary | ICD-10-CM | POA: Diagnosis not present

## 2018-04-22 DIAGNOSIS — F0390 Unspecified dementia without behavioral disturbance: Secondary | ICD-10-CM | POA: Diagnosis not present

## 2018-04-22 DIAGNOSIS — M549 Dorsalgia, unspecified: Secondary | ICD-10-CM | POA: Diagnosis not present

## 2018-04-22 DIAGNOSIS — E119 Type 2 diabetes mellitus without complications: Secondary | ICD-10-CM | POA: Diagnosis not present

## 2018-05-19 DIAGNOSIS — E119 Type 2 diabetes mellitus without complications: Secondary | ICD-10-CM | POA: Diagnosis not present

## 2018-05-19 DIAGNOSIS — H2513 Age-related nuclear cataract, bilateral: Secondary | ICD-10-CM | POA: Diagnosis not present

## 2018-06-10 DIAGNOSIS — M719 Bursopathy, unspecified: Secondary | ICD-10-CM | POA: Diagnosis not present

## 2018-06-10 DIAGNOSIS — F0390 Unspecified dementia without behavioral disturbance: Secondary | ICD-10-CM | POA: Diagnosis not present

## 2018-06-10 DIAGNOSIS — M542 Cervicalgia: Secondary | ICD-10-CM | POA: Diagnosis not present

## 2018-07-28 DIAGNOSIS — I10 Essential (primary) hypertension: Secondary | ICD-10-CM | POA: Diagnosis not present

## 2018-07-28 DIAGNOSIS — M1990 Unspecified osteoarthritis, unspecified site: Secondary | ICD-10-CM | POA: Diagnosis not present

## 2018-07-28 DIAGNOSIS — F0390 Unspecified dementia without behavioral disturbance: Secondary | ICD-10-CM | POA: Diagnosis not present

## 2018-07-28 DIAGNOSIS — E039 Hypothyroidism, unspecified: Secondary | ICD-10-CM | POA: Diagnosis not present

## 2018-07-28 DIAGNOSIS — H409 Unspecified glaucoma: Secondary | ICD-10-CM | POA: Diagnosis not present

## 2018-07-28 DIAGNOSIS — E119 Type 2 diabetes mellitus without complications: Secondary | ICD-10-CM | POA: Diagnosis not present

## 2018-09-06 ENCOUNTER — Other Ambulatory Visit: Payer: Self-pay | Admitting: Internal Medicine

## 2018-09-26 DIAGNOSIS — I10 Essential (primary) hypertension: Secondary | ICD-10-CM | POA: Diagnosis not present

## 2018-09-26 DIAGNOSIS — E119 Type 2 diabetes mellitus without complications: Secondary | ICD-10-CM | POA: Diagnosis not present

## 2018-09-26 DIAGNOSIS — M150 Primary generalized (osteo)arthritis: Secondary | ICD-10-CM | POA: Diagnosis not present

## 2018-09-26 DIAGNOSIS — F0281 Dementia in other diseases classified elsewhere with behavioral disturbance: Secondary | ICD-10-CM | POA: Diagnosis not present

## 2018-09-26 DIAGNOSIS — E782 Mixed hyperlipidemia: Secondary | ICD-10-CM | POA: Diagnosis not present

## 2018-09-26 DIAGNOSIS — E039 Hypothyroidism, unspecified: Secondary | ICD-10-CM | POA: Diagnosis not present

## 2018-09-26 DIAGNOSIS — Z6828 Body mass index (BMI) 28.0-28.9, adult: Secondary | ICD-10-CM | POA: Diagnosis not present

## 2018-09-26 DIAGNOSIS — H401230 Low-tension glaucoma, bilateral, stage unspecified: Secondary | ICD-10-CM | POA: Diagnosis not present

## 2018-10-17 DIAGNOSIS — Z0000 Encounter for general adult medical examination without abnormal findings: Secondary | ICD-10-CM | POA: Diagnosis not present

## 2018-10-17 DIAGNOSIS — Z6828 Body mass index (BMI) 28.0-28.9, adult: Secondary | ICD-10-CM | POA: Diagnosis not present

## 2018-11-25 DIAGNOSIS — R413 Other amnesia: Secondary | ICD-10-CM | POA: Diagnosis not present

## 2018-12-07 DIAGNOSIS — G9340 Encephalopathy, unspecified: Secondary | ICD-10-CM | POA: Diagnosis not present

## 2018-12-08 DIAGNOSIS — E119 Type 2 diabetes mellitus without complications: Secondary | ICD-10-CM | POA: Diagnosis not present

## 2018-12-08 DIAGNOSIS — M25511 Pain in right shoulder: Secondary | ICD-10-CM | POA: Diagnosis not present

## 2018-12-08 DIAGNOSIS — Z6828 Body mass index (BMI) 28.0-28.9, adult: Secondary | ICD-10-CM | POA: Diagnosis not present

## 2018-12-28 DIAGNOSIS — R109 Unspecified abdominal pain: Secondary | ICD-10-CM | POA: Diagnosis not present

## 2018-12-28 DIAGNOSIS — G8929 Other chronic pain: Secondary | ICD-10-CM | POA: Diagnosis not present

## 2018-12-28 DIAGNOSIS — E1165 Type 2 diabetes mellitus with hyperglycemia: Secondary | ICD-10-CM | POA: Diagnosis not present

## 2018-12-28 DIAGNOSIS — Z9049 Acquired absence of other specified parts of digestive tract: Secondary | ICD-10-CM | POA: Diagnosis not present

## 2018-12-28 DIAGNOSIS — M5136 Other intervertebral disc degeneration, lumbar region: Secondary | ICD-10-CM | POA: Diagnosis not present

## 2018-12-28 DIAGNOSIS — R1900 Intra-abdominal and pelvic swelling, mass and lump, unspecified site: Secondary | ICD-10-CM | POA: Diagnosis not present

## 2018-12-28 DIAGNOSIS — I493 Ventricular premature depolarization: Secondary | ICD-10-CM | POA: Diagnosis not present

## 2018-12-28 DIAGNOSIS — F0390 Unspecified dementia without behavioral disturbance: Secondary | ICD-10-CM | POA: Diagnosis not present

## 2018-12-28 DIAGNOSIS — M545 Low back pain: Secondary | ICD-10-CM | POA: Diagnosis not present

## 2018-12-28 DIAGNOSIS — R7309 Other abnormal glucose: Secondary | ICD-10-CM | POA: Diagnosis not present

## 2018-12-28 DIAGNOSIS — Z90710 Acquired absence of both cervix and uterus: Secondary | ICD-10-CM | POA: Diagnosis not present

## 2018-12-28 DIAGNOSIS — K449 Diaphragmatic hernia without obstruction or gangrene: Secondary | ICD-10-CM | POA: Diagnosis not present

## 2018-12-29 DIAGNOSIS — M546 Pain in thoracic spine: Secondary | ICD-10-CM | POA: Diagnosis not present

## 2018-12-29 DIAGNOSIS — M7918 Myalgia, other site: Secondary | ICD-10-CM | POA: Diagnosis not present

## 2018-12-29 DIAGNOSIS — M545 Low back pain: Secondary | ICD-10-CM | POA: Diagnosis not present

## 2018-12-29 DIAGNOSIS — M47894 Other spondylosis, thoracic region: Secondary | ICD-10-CM | POA: Diagnosis not present

## 2018-12-29 DIAGNOSIS — Z79891 Long term (current) use of opiate analgesic: Secondary | ICD-10-CM | POA: Diagnosis not present

## 2018-12-29 DIAGNOSIS — M47897 Other spondylosis, lumbosacral region: Secondary | ICD-10-CM | POA: Diagnosis not present

## 2019-01-11 DIAGNOSIS — M961 Postlaminectomy syndrome, not elsewhere classified: Secondary | ICD-10-CM | POA: Diagnosis not present

## 2019-01-11 DIAGNOSIS — M5136 Other intervertebral disc degeneration, lumbar region: Secondary | ICD-10-CM | POA: Diagnosis not present

## 2019-01-11 DIAGNOSIS — G894 Chronic pain syndrome: Secondary | ICD-10-CM | POA: Diagnosis not present

## 2019-01-23 ENCOUNTER — Telehealth: Payer: Self-pay | Admitting: Internal Medicine

## 2019-01-23 NOTE — Telephone Encounter (Signed)
Patient called requesting a call back from [place] to get some help on how to take her meds.  Please advise.

## 2019-01-24 NOTE — Telephone Encounter (Signed)
Spoke with patient. States she has medication and refills on her medication. States she just came back from [place] and she needs a home health aide to help her with her medication and putting them in pill boxes. States her memory is still bad and she just needs help. States she lives with her granddaughter and she helps her but she also has to work. Patient wants to know if this is something she can get. Patient right now only has Medicare part A and B. States when she left her medicaid ended.  To Dr. Amil Amen for further directions.

## 2019-01-25 ENCOUNTER — Ambulatory Visit: Payer: Medicare Other | Admitting: Family

## 2019-01-26 ENCOUNTER — Other Ambulatory Visit: Payer: Self-pay

## 2019-01-26 MED ORDER — METFORMIN HCL ER 500 MG PO TB24: ORAL_TABLET | ORAL | 0 refills | Status: DC

## 2019-01-27 DIAGNOSIS — M5416 Radiculopathy, lumbar region: Secondary | ICD-10-CM | POA: Diagnosis not present

## 2019-02-03 ENCOUNTER — Other Ambulatory Visit: Payer: Self-pay | Admitting: Internal Medicine

## 2019-02-06 ENCOUNTER — Ambulatory Visit (INDEPENDENT_AMBULATORY_CARE_PROVIDER_SITE_OTHER): Payer: Medicare Other | Admitting: Family

## 2019-02-06 ENCOUNTER — Encounter: Payer: Self-pay | Admitting: Family

## 2019-02-06 ENCOUNTER — Other Ambulatory Visit: Payer: Self-pay

## 2019-02-06 VITALS — BP 142/84 | HR 71 | Temp 97.7°F | Ht 62.25 in | Wt 157.4 lb

## 2019-02-06 DIAGNOSIS — Z1231 Encounter for screening mammogram for malignant neoplasm of breast: Secondary | ICD-10-CM

## 2019-02-06 DIAGNOSIS — G4700 Insomnia, unspecified: Secondary | ICD-10-CM

## 2019-02-06 DIAGNOSIS — G301 Alzheimer's disease with late onset: Secondary | ICD-10-CM

## 2019-02-06 DIAGNOSIS — M25511 Pain in right shoulder: Secondary | ICD-10-CM

## 2019-02-06 DIAGNOSIS — G8929 Other chronic pain: Secondary | ICD-10-CM

## 2019-02-06 DIAGNOSIS — R609 Edema, unspecified: Secondary | ICD-10-CM

## 2019-02-06 DIAGNOSIS — F0280 Dementia in other diseases classified elsewhere without behavioral disturbance: Secondary | ICD-10-CM

## 2019-02-06 DIAGNOSIS — K5901 Slow transit constipation: Secondary | ICD-10-CM | POA: Diagnosis not present

## 2019-02-06 DIAGNOSIS — E119 Type 2 diabetes mellitus without complications: Secondary | ICD-10-CM

## 2019-02-06 DIAGNOSIS — I10 Essential (primary) hypertension: Secondary | ICD-10-CM

## 2019-02-06 DIAGNOSIS — E039 Hypothyroidism, unspecified: Secondary | ICD-10-CM | POA: Diagnosis not present

## 2019-02-06 DIAGNOSIS — Z23 Encounter for immunization: Secondary | ICD-10-CM

## 2019-02-06 DIAGNOSIS — M545 Low back pain: Secondary | ICD-10-CM | POA: Diagnosis not present

## 2019-02-06 DIAGNOSIS — H409 Unspecified glaucoma: Secondary | ICD-10-CM

## 2019-02-06 DIAGNOSIS — Z780 Asymptomatic menopausal state: Secondary | ICD-10-CM

## 2019-02-06 DIAGNOSIS — E782 Mixed hyperlipidemia: Secondary | ICD-10-CM

## 2019-02-06 NOTE — Progress Notes (Addendum)
Provider: Jamie-Lee Galdamez FNP-C   Lucianne Lei, MD  Patient Care Team: Lucianne Lei, MD as PCP - [facility])  Extended Emergency Contact Information Primary Emergency Contact: Conley Rolls Mobile Phone: [phone] Relation: Granddaughter Preferred language: English Interpreter needed? No Secondary Emergency Contact: Murriel Hopper [place] of [place] Phone: [phone] Mobile Phone: [phone] Relation: Daughter  Code Status: Full Code  Goals of care: Advanced Directive information Advanced Directives 02/06/2019  Does Patient Have a Medical Advance Directive? No  Would patient like information on creating a medical advance directive? Yes (MAU/Ambulatory/Procedural Areas - Information given)     Chief Complaint  Patient presents with   Establish Care    Patient here for first visit to establish care, accompanied by granddaughter, Jonelle Sidle   Immunizations    Flu - agreed to get flu shot today   Immunizations    Tetanus - declined    HPI:  Pt is a 78 y.o. female seen today at [facility] to establish care for medical management of chronic diseases.she denies any acute issues during visit.she is here with her Granddaughter who provides additional information for HPI.she relocated from [place] to leave with her Granddaughter here in [place].she has a medical history of hypertension,Type 2 Diabetes,chronic lower back pain,hypothyroidism,Galucoma,right shoulder pain,Dementia without behavioral issues among other conditions.she states her memory is declining.she still does her activity of daily living by herself but having issues remembering and making decision.Has Aricept but unclear if she takes it or not.she declined MMSE evaluation this visit states has own Neurologist that will call for appointment.though Granddaughter is not sure whether patient has already established with Neurology or she will need referral.she doesn't think patient has a number  for Neurologist but will call provider if referral is required.    Hypertension - she does not check her blood pressure at home.currenlty on amlodipine 5 mg tablet daily, HCZT 25 mg tablet daily,losartan 100 mg tablet daily and Furosemide 20 mg tablet daily.she denies any headache,dizziness,faintness,chest pain,palpitation or shortness of breath.    Type 2 Diabetes - on metformin 500 mg tablet 24 Hr tablet twice daily.states not taking glimepiride 2 mg tablet twice daily.she is off ASA and not on a statin.she states was seen by Ophthalmology in may,2020 prior to relocating to [place].she has not seen a podiatrist this year.she does not check her CBG at home.   Hypothyroidism - on levothyroxine 88 mcg tablet daily. She denies s/sx of hypo/hyperthyroidisim.   Glaucoma - on lumigan 0.01% one drop daily at bedtime.last saw opthalmology 08/2018.  Lower back pain - hx of lower back surgery with screws placed ? Year.she follows up with Dr.Ramos for pain management.currently on Hydrocodone-Acetaminophen 10-325 mg tablet one by mouth three times daily. She will continue to follow up with Dr.Ramos.  Right shoulder pain - chronic.on Hydrocodone-Acetaminophen 10-325 mg tablet one by mouth three times daily and diclofenac 4 gm twice daily as needed.   Insomnia - on Ambien 5 mg tablet daily as needed.    Immunization - she is due for influenza vaccine last given in 2019.Also Pneumovax in 2019.will obtain records to verify if second pnumovax was administered by previous PCP.she declined Tdap vaccine.    She is due for her mammogram and Bone density.Has not had this in a while.     Past Medical History:  Diagnosis Date   Allergy    Arthritis    Chronic back pain    DDD (degenerative disc disease), lumbar    Diabetes mellitus without complication (  [place])    Diabetes type 2, uncontrolled ([place]) 09/18/2006   Annotation: Noninsulin dependent Qualifier: Diagnosis of  By: Amil Amen MD, Benjamine Mola     Former  tobacco use    Hyperlipidemia    a. H/o muscle aches with statins per PCP notes.   Hypertension    Hypothyroidism    a. prior thyroid removal.   Stroke ([place])    a. Listed in PCP notes: "History of stroke:  History of terrible headache.  Had a CT scan of brain and found "ministroke"  Was removed from ERT subsequently. "   Past Surgical History:  Procedure Laterality Date   ABDOMINAL HYSTERECTOMY  1994   TAH, not clear if unilateral oophorectomy as well.   [place]   open   DECOMPRESSIVE LUMBAR LAMINECTOMY LEVEL 4  2005   In [place]  2012   multinodular goiter    Allergies  Allergen Reactions   Clindamycin Hcl Other (See Comments)    REACTION: C. difficile colitis   Sulfamethoxazole-Trimethoprim     Other reaction(s): Eye redness   Zanaflex [Tizanidine Hcl] Anaphylaxis   Metronidazole Hives   Trazodone And Nefazodone Other (See Comments)    Sinus pains   Statins Other (See Comments)    Side Effect: muscle pain Has tried Zocor, Crestor--she does not want to take a statin, period   Ace Inhibitors Cough   Adhesive [Tape] Itching, Rash and Other (See Comments)    Blistering of skin     Allergies as of 02/06/2019      Reactions   Clindamycin Hcl Other (See Comments)   REACTION: C. difficile colitis   Sulfamethoxazole-trimethoprim    Other reaction(s): Eye redness   Zanaflex [tizanidine Hcl] Anaphylaxis   Metronidazole Hives   Trazodone And Nefazodone Other (See Comments)   Sinus pains   Statins Other (See Comments)   Side Effect: muscle pain Has tried Zocor, Crestor--she does not want to take a statin, period   Ace Inhibitors Cough   Adhesive [tape] Itching, Rash, Other (See Comments)   Blistering of skin      Medication List       Accurate as of February 06, 2019 11:45 AM. If you have any questions, ask your nurse or doctor.        STOP taking these medications   aspirin  81 MG EC tablet Stopped by: Nelda Bucks Loreda Silverio, NP   betamethasone dipropionate 0.05 % cream Stopped by: Sandrea Hughs, NP   ESTROVEN PO Stopped by: Sandrea Hughs, NP   fluticasone 50 MCG/ACT nasal spray Commonly known as: FLONASE Stopped by: Sandrea Hughs, NP   gabapentin 100 MG capsule Commonly known as: NEURONTIN Stopped by: Sandrea Hughs, NP   glimepiride 2 MG tablet Commonly known as: AMARYL Stopped by: Sandrea Hughs, NP   ranitidine 300 MG tablet Commonly known as: ZANTAC Stopped by: Sandrea Hughs, NP   rosuvastatin 10 MG tablet Commonly known as: Crestor Stopped by: Sandrea Hughs, NP   Spenco Gel Heel Cup Med/Lg Misc Stopped by: Sandrea Hughs, NP     TAKE these medications   Accu-Chek Aviva Plus w/Device Kit Check blood sugar twice daily before meals. For Dx: E11.65   accu-chek softclix lancets Use as instructed   Accu-Chek Softclix Lancets lancets Check blood sugar twice daily before meals. For Dx: E11.65   amLODipine 5 MG tablet Commonly known as: NORVASC Take 1 tablet (5  mg total) by mouth daily.   bimatoprost 0.01 % Soln Commonly known as: LUMIGAN Place 1 drop into the left eye at bedtime.   diclofenac sodium 1 % Gel Commonly known as: VOLTAREN Apply 4 g topically 2 (two) times daily as needed (pain).   donepezil 5 MG tablet Commonly known as: ARICEPT Take 5 mg by mouth at bedtime.   Fish Oil 1000 MG Caps Take 1,000 mg by mouth 2 (two) times daily.   furosemide 20 MG tablet Commonly known as: LASIX 1 tab by mouth in the morning daily if swelling of legs   glucose blood test strip Commonly known as: Accu-Chek Aviva Check sugars twice daily before meals. For Dx: E11.65   hydrochlorothiazide 25 MG tablet Commonly known as: HYDRODIURIL 1 tab by mouth every morning.   HYDROcodone-acetaminophen 10-325 MG tablet Commonly known as: NORCO Take 1 tablet by mouth 3 (three) times daily. What changed: Another medication with the  same name was removed. Continue taking this medication, and follow the directions you see here. Changed by: Sandrea Hughs, NP   levothyroxine 88 MCG tablet Commonly known as: SYNTHROID Take 1 tablet (88 mcg total) by mouth daily before breakfast.   losartan 100 MG tablet Commonly known as: COZAAR Take 1 tablet (100 mg total) by mouth daily.   metFORMIN 500 MG 24 hr tablet Commonly known as: GLUCOPHAGE-XR 1 tab by mouth twice daily with meals   multivitamin with minerals Tabs tablet Take 1 tablet by mouth daily.   polyethylene glycol 17 g packet Commonly known as: MIRALAX / GLYCOLAX Take 17 g by mouth daily.   Potassium Chloride ER 20 MEQ Tbcr TAKE 1 TABLET BY MOUTH DAILY   TYLENOL SINUS SEVERE PO Take 2 tablets by mouth daily as needed (sinus and congestion).   Mucinex Sinus-Max 10-650-400 MG/20ML Liqd Generic drug: Phenylephrine-APAP-guaiFENesin Take 10 mLs by mouth as needed.   zolpidem 5 MG tablet Commonly known as: AMBIEN Take 5 mg by mouth at bedtime as needed.       Review of Systems  Constitutional: Negative for appetite change, chills, fatigue and fever.  HENT: Negative for congestion, ear discharge, ear pain, hearing loss, rhinorrhea, sinus pressure, sinus pain, sneezing, sore throat and tinnitus.   Eyes: Positive for visual disturbance. Negative for discharge, redness and itching.       Hx Glaucoma   Respiratory: Negative for cough, chest tightness, shortness of breath and wheezing.   Cardiovascular: Negative for chest pain and palpitations.       Bilateral leg swelling managed with furosemide.   Gastrointestinal: Negative for abdominal distention, abdominal pain, constipation, diarrhea, nausea and vomiting.  Endocrine: Negative for cold intolerance, heat intolerance, polydipsia, polyphagia and polyuria.  Genitourinary: Negative for decreased urine volume, difficulty urinating, dysuria, flank pain, frequency and urgency.  Musculoskeletal: Positive for  arthralgias and back pain. Negative for gait problem and joint swelling.       No recent fall episode  Skin: Negative for color change, pallor and rash.  Neurological: Negative for dizziness, weakness, light-headedness, numbness and headaches.  Hematological: Does not bruise/bleed easily.  Psychiatric/Behavioral: Positive for confusion. Negative for agitation, sleep disturbance and suicidal ideas. The patient is not nervous/anxious.        Declined feelings of depression     Immunization History  Administered Date(s) Administered   Influenza Whole 03/08/2009   Influenza, High Dose Seasonal PF 01/18/2017   Influenza,inj,quad, With Preservative 01/25/2017   Influenza-Unspecified 02/12/2016, 01/23/2017   Pneumococcal Polysaccharide-23 04/25/2004, 05/29/2017  Td 04/27/2003   Zoster 04/27/2017   Pertinent  Health Maintenance Due  Topic Date Due   FOOT EXAM  11/07/1950   OPHTHALMOLOGY EXAM  11/07/1950   DEXA SCAN  11/07/2006   HEMOGLOBIN A1C  09/20/2017   PNA vac Low Risk Adult (2 of 2 - PCV13) 05/29/2018   INFLUENZA VACCINE  11/26/2018   Fall Risk  02/06/2019  Falls in the past year? 0    Vitals:   02/06/19 1108  BP: (!) 142/84  Pulse: 71  Temp: 97.7 F (36.5 C)  TempSrc: Oral  SpO2: 98%  Weight: 157 lb 6.4 oz (71.4 kg)  Height: 5' 2.25" (1.581 m)   Body mass index is 28.56 kg/m. Physical Exam Constitutional:      General: She is not in acute distress.    Appearance: She is overweight. She is not ill-appearing.  HENT:     Head: Normocephalic.     Right Ear: Tympanic membrane, ear canal and external ear normal. There is no impacted cerumen.     Left Ear: Tympanic membrane, ear canal and external ear normal. There is no impacted cerumen.     Nose: Nose normal. No congestion or rhinorrhea.     Mouth/Throat:     Mouth: Mucous membranes are moist.     Pharynx: Oropharynx is clear. No oropharyngeal exudate or posterior oropharyngeal erythema.  Eyes:      General: No scleral icterus.       Right eye: No discharge.        Left eye: No discharge.     Extraocular Movements: Extraocular movements intact.     Conjunctiva/sclera: Conjunctivae normal.     Pupils: Pupils are equal, round, and reactive to light.  Neck:     Musculoskeletal: Normal range of motion. No neck rigidity or muscular tenderness.     Vascular: No carotid bruit.  Cardiovascular:     Rate and Rhythm: Normal rate and regular rhythm.     Pulses: Normal pulses.     Heart sounds: Normal heart sounds. No murmur. No friction rub. No gallop.   Pulmonary:     Effort: Pulmonary effort is normal. No respiratory distress.     Breath sounds: Normal breath sounds. No wheezing, rhonchi or rales.  Chest:     Chest wall: No tenderness.  Abdominal:     General: Bowel sounds are normal. There is no distension.     Palpations: Abdomen is soft. There is no mass.     Tenderness: There is no abdominal tenderness. There is no right CVA tenderness, left CVA tenderness, guarding or rebound.  Genitourinary:    Comments: Wears incontinent depends  Musculoskeletal: Normal range of motion.        General: No swelling, tenderness or deformity.     Comments: Bilateral lower extremities trace edema.   Lymphadenopathy:     Cervical: No cervical adenopathy.  Skin:    General: Skin is warm and dry.     Coloration: Skin is not pale.     Findings: No bruising or erythema.  Neurological:     Mental Status: She is alert and oriented to person, place, and time.  Psychiatric:        Mood and Affect: Mood normal.        Speech: Speech normal.        Behavior: Behavior is agitated.        Thought Content: Thought content normal.        Cognition and Memory: Memory is  impaired.        Judgment: Judgment normal.     Labs reviewed: No results for input(s): NA, K, CL, CO2, GLUCOSE, BUN, CREATININE, CALCIUM, MG, PHOS in the last 8760 hours. No results for input(s): AST, ALT, ALKPHOS, BILITOT, PROT,  ALBUMIN in the last 8760 hours. No results for input(s): WBC, NEUTROABS, HGB, HCT, MCV, PLT in the last 8760 hours. Lab Results  Component Value Date   TSH 0.264 (L) 10/02/2016   Lab Results  Component Value Date   HGBA1C 8.3 (H) 03/23/2017   Lab Results  Component Value Date   CHOL 281 (H) 03/23/2017   HDL 94 03/23/2017   LDLCALC 159 (H) 03/23/2017   TRIG 138 03/23/2017   CHOLHDL 3.1 04/23/2016    Significant Diagnostic Results in last 30 days:  No results found.  Assessment/Plan 1. Essential hypertension B/p not at goal this visit no home readings for comparision.continue on amlodipine 5 mg tablet daily,HCZT 25 mg tablet daily,losartan 100 mg tablet daily and furosemide 20 mg tablet daily. - CBC with Differential/Platelet - CMP with eGFR([facility])  2. Type 2 diabetes mellitus without complication, without long-term current use of insulin (HCC) No CBG home readings for evaluation.encouraged to check CBG and record on log then bring to visit.No symptoms of hypo/hyperglycemia reported.continue on Metformin 500 mg tablet 24 Hr daily with meals.  - Hemoglobin A1c - Ambulatory referral to Podiatry  3. Hypothyroidism, unspecified type On levothyroxine 88 mcg tablet daily. - TSH level   4. Chronic bilateral low back pain without sciatica Norco 10-325 mg tablet three times daily effective.managed by Dr. Nelva Bush.  5. Chronic right shoulder pain Current regimen effective.continue to follow up with Dr.Ramos.   6. Slow transit constipation Encouraged fluid and fiber in diet.continue on miralax 17 gm daily   7. Breast cancer screening by mammogram Due for mammogram. - MM Digital Screening; Future  8. Mixed hyperlipidemia Allergic to statin.continue on Omega -3 fatty acids 1000 mg twice daily. encouraged low carbo,low saturated fats and high vegetable diet.  - Lipid Panel  9. Postmenopausal estrogen deficiency No previous Bone density for review. - DG Bone Density; Future  10.  Late onset Alzheimer's disease without behavioral disturbance ([place]) Worsening memory reported.Relocated in may ,2020 to live with her Granddaughter.Declined MMSE.States has a Garment/textile technologist will call for appointment though Granddaughter does not think patient has any Neurologist.she will call [facility] office if referral is needed.encouraged to encage in activities that stimulant the brain such as puzzles,crosswords,reading etc sleep hygiene,heart healthy diet and physical activity/exercise at least three times per week for 30 minutes as tolerated encouraged.   11. Need for influenza vaccination No signs of URI's.Afebrile.  - Flu Vaccine QUAD High Dose(Fluad) given by CMA.   12. Glaucoma of both eyes, unspecified glaucoma type Continue on Lumigan 0.01% one drop at bedtime.follow up with Ophthalmology.  13. Insomnia  Continue on Ambien 5 mg tablet at bedtime as needed.Has been on this for a while prefer melatonin to prevent falls.will continue to re-evaluate.   14.Peripheral edema No signs of fluid overload.Continue on furosemide 20 mg tablet daily.on Potassium chloride ER 20 MeQ tablet daily.encouraged to monitor weight.cut down on salt intake in diet.   Family/ staff Communication: Reviewed plan of care with patient.   Labs/tests ordered:   - CBC with Differential/Platelet - CMP with eGFR([facility]) - Hemoglobin A1C - Lipid Panel - Ambulatory referral to Podiatry - DG Bone Density; Future  Sandrea Hughs, NP

## 2019-02-07 ENCOUNTER — Other Ambulatory Visit: Payer: Self-pay | Admitting: *Deleted

## 2019-02-07 ENCOUNTER — Other Ambulatory Visit: Payer: Self-pay

## 2019-02-07 DIAGNOSIS — R7989 Other specified abnormal findings of blood chemistry: Secondary | ICD-10-CM

## 2019-02-07 DIAGNOSIS — E039 Hypothyroidism, unspecified: Secondary | ICD-10-CM

## 2019-02-07 LAB — CBC WITH DIFFERENTIAL/PLATELET
Absolute Monocytes: 424 cells/uL (ref 200–950)
Basophils Absolute: 50 cells/uL (ref 0–200)
Basophils Relative: 0.9 %
Eosinophils Absolute: 160 cells/uL (ref 15–500)
Eosinophils Relative: 2.9 %
HCT: 38.4 % (ref 35.0–45.0)
Hemoglobin: 12.7 g/dL (ref 11.7–15.5)
Lymphs Abs: 2002 cells/uL (ref 850–3900)
MCH: 30 pg (ref 27.0–33.0)
MCHC: 33.1 g/dL (ref 32.0–36.0)
MCV: 90.8 fL (ref 80.0–100.0)
MPV: 11.7 fL (ref 7.5–12.5)
Monocytes Relative: 7.7 %
Neutro Abs: 2866 cells/uL (ref 1500–7800)
Neutrophils Relative %: 52.1 %
Platelets: 234 10*3/uL (ref 140–400)
RBC: 4.23 10*6/uL (ref 3.80–5.10)
RDW: 13.1 % (ref 11.0–15.0)
Total Lymphocyte: 36.4 %
WBC: 5.5 10*3/uL (ref 3.8–10.8)

## 2019-02-07 LAB — COMPLETE METABOLIC PANEL WITH GFR
AG Ratio: 1.8 (calc) (ref 1.0–2.5)
ALT: 17 U/L (ref 6–29)
AST: 16 U/L (ref 10–35)
Albumin: 4.4 g/dL (ref 3.6–5.1)
Alkaline phosphatase (APISO): 51 U/L (ref 37–153)
BUN: 15 mg/dL (ref 7–25)
CO2: 24 mmol/L (ref 20–32)
Calcium: 10.1 mg/dL (ref 8.6–10.4)
Chloride: 106 mmol/L (ref 98–110)
Creat: 0.84 mg/dL (ref 0.60–0.93)
GFR, Est African American: 78 mL/min/{1.73_m2} (ref >=60)
GFR, Est Non African American: 67 mL/min/{1.73_m2} (ref >=60)
Globulin: 2.4 g/dL (calc) (ref 1.9–3.7)
Glucose, Bld: 127 mg/dL — ABNORMAL HIGH (ref 65–99)
Potassium: 4.2 mmol/L (ref 3.5–5.3)
Sodium: 143 mmol/L (ref 135–146)
Total Bilirubin: 0.6 mg/dL (ref 0.2–1.2)
Total Protein: 6.8 g/dL (ref 6.1–8.1)

## 2019-02-07 LAB — HEMOGLOBIN A1C
Hgb A1c MFr Bld: 6.2 % of total Hgb — ABNORMAL HIGH (ref <5.7)
Mean Plasma Glucose: 131 (calc)
eAG (mmol/L): 7.3 (calc)

## 2019-02-07 LAB — LIPID PANEL
Cholesterol: 226 mg/dL — ABNORMAL HIGH (ref <200)
HDL: 81 mg/dL (ref >=50)
LDL Cholesterol (Calc): 126 mg/dL (calc) — ABNORMAL HIGH
Non-HDL Cholesterol (Calc): 145 mg/dL (calc) — ABNORMAL HIGH (ref <130)
Total CHOL/HDL Ratio: 2.8 (calc) (ref <5.0)
Triglycerides: 86 mg/dL (ref <150)

## 2019-02-07 LAB — TSH: TSH: 0.06 mIU/L — ABNORMAL LOW (ref 0.40–4.50)

## 2019-02-07 MED ORDER — ZOLPIDEM TARTRATE 5 MG PO TABS: 5.0000 mg | ORAL_TABLET | Freq: Every evening | ORAL | 0 refills | Status: DC | PRN

## 2019-02-07 MED ORDER — LEVOTHYROXINE SODIUM 75 MCG PO TABS: 75.0000 ug | ORAL_TABLET | Freq: Every day | ORAL | 1 refills | Status: DC

## 2019-02-07 NOTE — Telephone Encounter (Signed)
[name], daughter called and requested refill. Stated that patient was to request yesterday at appointment.   Pended Rx and sent to [facility] for approval.

## 2019-02-07 NOTE — Addendum Note (Signed)
Addended by: Ruthell Rummage A on: 02/07/2019 04:55 PM   Modules accepted: Orders

## 2019-02-09 ENCOUNTER — Telehealth: Payer: Self-pay

## 2019-02-09 NOTE — Telephone Encounter (Addendum)
Patient called to request a consent form for her granddaughter so we can give her updates on her grandmother visits and progress if any and she wanted a copy of her last visit sent to her as well documents sent

## 2019-02-17 ENCOUNTER — Encounter: Payer: Self-pay | Admitting: Internal Medicine

## 2019-02-17 ENCOUNTER — Other Ambulatory Visit: Payer: Self-pay

## 2019-02-17 ENCOUNTER — Ambulatory Visit (INDEPENDENT_AMBULATORY_CARE_PROVIDER_SITE_OTHER): Payer: Medicare Other | Admitting: Internal Medicine

## 2019-02-17 VITALS — BP 130/80 | HR 76 | Resp 12 | Ht 62.25 in | Wt 157.0 lb

## 2019-02-17 DIAGNOSIS — R413 Other amnesia: Secondary | ICD-10-CM

## 2019-02-17 DIAGNOSIS — I10 Essential (primary) hypertension: Secondary | ICD-10-CM

## 2019-02-17 DIAGNOSIS — E1165 Type 2 diabetes mellitus with hyperglycemia: Secondary | ICD-10-CM

## 2019-02-17 DIAGNOSIS — E1142 Type 2 diabetes mellitus with diabetic polyneuropathy: Secondary | ICD-10-CM

## 2019-02-17 DIAGNOSIS — Z23 Encounter for immunization: Secondary | ICD-10-CM

## 2019-02-17 MED ORDER — GLUCOSE BLOOD VI STRP: ORAL_STRIP | 3 refills | Status: DC

## 2019-02-17 MED ORDER — ACCU-CHEK SOFTCLIX LANCETS MISC: 3 refills | Status: DC

## 2019-02-17 MED ORDER — METFORMIN HCL ER 500 MG PO TB24: ORAL_TABLET | ORAL | 3 refills | Status: DC

## 2019-02-17 MED ORDER — ACCU-CHEK AVIVA PLUS W/DEVICE KIT: PACK | 0 refills | Status: DC

## 2019-02-17 NOTE — Progress Notes (Signed)
Subjective:    Patient ID: Joy Ward, female   DOB: 08-30-1940, 78 y.o.   MRN: [id number]   HPI  Has returned to [facility] and would like to restart here with primary care.  Granddaughter, Jonelle Sidle, with her today.  1.  Memory Loss:  Discussed had work up here with Neuropsych and has apparently had work up in [place].  She feels she is being told different things about her memory loss and what can be helpful.    1.  Hypertension:  Taking mediction.  2.  DM:  Continues on long acting Metformin.  Current Meds  Medication Sig  . ACCU-CHEK SOFTCLIX LANCETS lancets Check blood sugar twice daily before meals. For Dx: E11.65  . amLODipine (NORVASC) 5 MG tablet Take 1 tablet (5 mg total) by mouth daily.  . bimatoprost (LUMIGAN) 0.01 % SOLN Place 1 drop into the left eye at bedtime.  . Blood Glucose Monitoring Suppl (ACCU-CHEK AVIVA PLUS) w/Device KIT Check blood sugar twice daily before meals. For Dx: E11.65  . furosemide (LASIX) 20 MG tablet 1 tab by mouth in the morning daily if swelling of legs  . glucose blood (ACCU-CHEK AVIVA) test strip Check sugars twice daily before meals. For Dx: E11.65  . Lancet Devices (ACCU-CHEK SOFTCLIX) lancets Use as instructed  . levothyroxine (SYNTHROID) 88 MCG tablet Take 88 mcg by mouth daily before breakfast.  . losartan (COZAAR) 100 MG tablet Take 1 tablet (100 mg total) by mouth daily.  . metFORMIN (GLUCOPHAGE-XR) 500 MG 24 hr tablet 1 tab by mouth twice daily with meals  . Multiple Vitamin (MULTIVITAMIN WITH MINERALS) TABS tablet Take 1 tablet by mouth daily.  . Omega-3 Fatty Acids (FISH OIL) 1000 MG CAPS Take 1,000 mg by mouth 2 (two) times daily.   . Potassium Chloride ER 20 MEQ TBCR TAKE 1 TABLET BY MOUTH DAILY   Allergies  Allergen Reactions  . Clindamycin Hcl Other (See Comments)    REACTION: C. difficile colitis  . Sulfamethoxazole-Trimethoprim     Other reaction(s): Eye redness  . Zanaflex [Tizanidine Hcl] Anaphylaxis  .  Metronidazole Hives  . Trazodone And Nefazodone Other (See Comments)    Sinus pains  . Statins Other (See Comments)    Side Effect: muscle pain Has tried Zocor, Crestor--she does not want to take a statin, period  . Ace Inhibitors Cough  . Adhesive [Tape] Itching, Rash and Other (See Comments)    Blistering of skin      Review of Systems    Objective:   BP 130/80 (BP Location: Left Arm, Patient Position: Sitting, Cuff Size: Normal)   Pulse 76 Comment: irregular  Resp 12   Ht 5' 2.25" (1.581 m)   Wt 157 lb (71.2 kg)   BMI 28.49 kg/m   Physical Exam  NAD HEENT:  PERRL, EOMI Lungs:  CTA CV:  RRR without murmur or rub.  Radial pulses normal and equal. LE:  No edema.    Assessment & Plan  1.  Memory Loss:  Make a goal to learn something--discussed no matter what the cause of memory loss, this can be helpful.  Also discussed I feel she has some underlying depression as well as she really just stays home now much of the time and learning something new/getting involved in something may help with that. Has Aricept at home and takes here and there.  Sounds like 5 mg.  Round and round whether wants to take or not.  Release for records from neurologist in  [place] Look at neuro psych notes  2.  HM:  ABN with Td--not sure why--unable to get this through system, so will hold giving for now.  3.  Hypertension:  Controlled  4.  DM:  Recent A1C at 6.2%, controlled

## 2019-02-17 NOTE — Patient Instructions (Addendum)
See about Medicare forms for glucometer and test strips and lancets. I did send the prescription however to [place].  Check with [facility] to see if they do the daily pill packaging. If not, let me know and [facility]  Get outside to walk in the sunshine daily--if it's dusk that's okay too.  Pick something new to learn and make little goals to learn  TV out of bedroom  Go to bed at same time and get up at same time every day  No daytime napping--if you keep falling asleep--get up and move around.  Avoid chocolate or caffeine after lunch  [facility] [place], [place] [address]  [phone] Hours of Operation Mondays to Thursdays: 8 am to 8 pm Fridays: 9 am to 8 pm Saturdays: 9 am to 1 pm Sundays: Closed

## 2019-02-27 ENCOUNTER — Telehealth: Payer: Self-pay | Admitting: Internal Medicine

## 2019-02-27 NOTE — Telephone Encounter (Signed)
Conley Rolls (pt's grandaughter) called stating [facility]. [facility] and [facility] not do the daily pill packaging and would like to know if we are aware of any other pharmacy that might do it.  Please advise. Per Jonelle Sidle- Is okay to leave a voicemail with details

## 2019-02-28 NOTE — Telephone Encounter (Signed)
Left detailed message for Joy Ward to [facility] [address] and have it set up but it must be mail order. Informed to call if she has any other issues regarding setting up her medication.

## 2019-03-05 ENCOUNTER — Other Ambulatory Visit: Payer: Self-pay | Admitting: Internal Medicine

## 2019-03-22 ENCOUNTER — Telehealth: Payer: Self-pay | Admitting: Internal Medicine

## 2019-03-22 DIAGNOSIS — E119 Type 2 diabetes mellitus without complications: Secondary | ICD-10-CM | POA: Diagnosis not present

## 2019-03-22 DIAGNOSIS — H401232 Low-tension glaucoma, bilateral, moderate stage: Secondary | ICD-10-CM | POA: Diagnosis not present

## 2019-03-22 DIAGNOSIS — H04123 Dry eye syndrome of bilateral lacrimal glands: Secondary | ICD-10-CM | POA: Diagnosis not present

## 2019-03-22 NOTE — Telephone Encounter (Signed)
Please inform daughter. Forms not done yet. Will be ready sometime next week

## 2019-03-22 NOTE — Telephone Encounter (Signed)
Called daughter; unable to leave message vm is full.

## 2019-03-22 NOTE — Telephone Encounter (Signed)
[name] ; pt's daughter called to get update on FMLA form dropped off last week.   Linda's phone number is [phone]  Please advise.

## 2019-03-28 ENCOUNTER — Telehealth: Payer: Self-pay

## 2019-03-28 NOTE — Telephone Encounter (Signed)
Okay.will discuss patient's behaviors during visit.

## 2019-03-28 NOTE — Telephone Encounter (Signed)
Granddaughter called and stated patient's behavior had gotten much worse with dementia and has become more combative. She would like to speak to [facility] about referral for neurology. Set up patient with a telephone visit with Dinah this Friday 03/31/2019

## 2019-03-29 ENCOUNTER — Encounter: Payer: Self-pay | Admitting: Internal Medicine

## 2019-03-29 DIAGNOSIS — R413 Other amnesia: Secondary | ICD-10-CM | POA: Insufficient documentation

## 2019-03-29 HISTORY — DX: Other amnesia: R41.3

## 2019-03-30 NOTE — Telephone Encounter (Signed)
Spoke with daughter to informed FMLA form has been filled out and faxed over to [employer] and her copy is ready to pick up.  Verbalized understanding.

## 2019-03-31 ENCOUNTER — Encounter: Payer: Self-pay | Admitting: Family

## 2019-03-31 ENCOUNTER — Other Ambulatory Visit: Payer: Self-pay

## 2019-03-31 ENCOUNTER — Ambulatory Visit (INDEPENDENT_AMBULATORY_CARE_PROVIDER_SITE_OTHER): Payer: Medicare Other | Admitting: Family

## 2019-03-31 DIAGNOSIS — G301 Alzheimer's disease with late onset: Secondary | ICD-10-CM

## 2019-03-31 DIAGNOSIS — K5901 Slow transit constipation: Secondary | ICD-10-CM | POA: Diagnosis not present

## 2019-03-31 DIAGNOSIS — F0280 Dementia in other diseases classified elsewhere without behavioral disturbance: Secondary | ICD-10-CM

## 2019-03-31 MED ORDER — DOCUSATE SODIUM 100 MG PO CAPS: 100.0000 mg | ORAL_CAPSULE | Freq: Every day | ORAL | 0 refills | Status: DC

## 2019-03-31 NOTE — Progress Notes (Signed)
This service is provided via telemedicine  No vital signs collected/recorded due to the encounter was a telemedicine visit.   Location of patient (ex: home, work):  Home  Patient consents to a telephone visit:  Yes  Location of the provider (ex: office, home):  Office  Name of any referring provider: N/A    Names of all persons participating in the telemedicine service and their role in the encounter: Joy Ward RMA, Joy Sax NP, Joy Ward Patient, Granddaughter Joy Ward  Time spent on call:   10 min     Provider:   FNP-C  , Nelda Bucks, NP  Patient Care Team: , Nelda Bucks, NP as PCP - General (Family Medicine)  Extended Emergency Contact Information Primary Emergency Contact: Joy Ward Mobile Phone: [phone] Relation: Granddaughter Preferred language: English Interpreter needed? No Secondary Emergency Contact: Joy Ward States of [place] Phone: [phone] Mobile Phone: [phone] Relation: Daughter  Code Status: Full Code  Goals of care: Advanced Directive information Advanced Directives 03/31/2019  Does Patient Have a Medical Advance Directive? No  Would patient like information on creating a medical advance directive? -     Chief Complaint  Patient presents with  . Acute Visit    Referral for Neurology    HPI:  Pt is a 78 y.o. female seen today for an acute visit for evaluation of dementia.she states her memory is worsening would like a referral to Neurologist.I saw her in October,2020 with similar symptoms but wanted to call her own Neurologist that she had seen in the past.Her [place] daughter present during the visit states patient was not able to find the Neurologist name.on chart review patient has been seen by Dr.Dohmeier Asencion Partridge at [facility] in [place].patient states doesn't recall MD's name.Grandadaughter request referral to [facility].  Daughter states patient  continues to perform her own activity of daily living by herself except daughter assist with her cooking.she does not drive or attmept to [place].patient argument ive at times towards her daughter. Also complains of constipation.   Past Medical History:  Diagnosis Date  . Allergy   . Arthritis   . Chronic back pain   . DDD (degenerative disc disease), lumbar   . Diabetes mellitus without complication ([place])   . Diabetes type 2, uncontrolled ([place]) 09/18/2006   Annotation: Noninsulin dependent Qualifier: Diagnosis of  By: Amil Amen MD, Benjamine Mola    . Former tobacco use   . Hyperlipidemia    a. H/o muscle aches with statins per PCP notes.  . Hypertension   . Hypothyroidism    a. prior thyroid removal.  . Memory loss or impairment 03/29/2019  . Stroke [facility])    a. Listed in PCP notes: "History of stroke:  History of terrible headache.  Had a CT scan of brain and found "ministroke"  Was removed from ERT subsequently. "   Past Surgical History:  Procedure Laterality Date  . ABDOMINAL HYSTERECTOMY  1994   TAH, not clear if unilateral oophorectomy as well.  Marland Kitchen [place]  . CHOLECYSTECTOMY  1973   open  . DECOMPRESSIVE LUMBAR LAMINECTOMY LEVEL 4  2005   In [place]  . THYROIDECTOMY  2012   multinodular goiter    Allergies  Allergen Reactions  . Clindamycin Hcl Other (See Comments)    REACTION: C. difficile colitis  . Sulfamethoxazole-Trimethoprim     Other reaction(s): Eye redness  . Zanaflex [Tizanidine Hcl] Anaphylaxis  . Metronidazole Hives  . Trazodone And Nefazodone Other (  See Comments)    Sinus pains  . Statins Other (See Comments)    Side Effect: muscle pain Has tried Zocor, Crestor--she does not want to take a statin, period  . Ace Inhibitors Cough  . Adhesive [Tape] Itching, Rash and Other (See Comments)    Blistering of skin     Outpatient Encounter Medications as of 03/31/2019  Medication Sig  . Accu-Chek Softclix Lancets lancets Check  blood sugar twice daily before meals. For Dx: E11.65  . amLODipine (NORVASC) 5 MG tablet Take 1 tablet (5 mg total) by mouth daily.  . bimatoprost (LUMIGAN) 0.01 % SOLN Place 1 drop into the left eye at bedtime.  . Blood Glucose Monitoring Suppl (ACCU-CHEK AVIVA PLUS) w/Device KIT Check blood sugar twice daily before meals. For Dx: E11.65  . diclofenac sodium (VOLTAREN) 1 % GEL Apply 4 g topically 2 (two) times daily as needed (pain).  Marland Kitchen donepezil (ARICEPT) 5 MG tablet Take 5 mg by mouth at bedtime.  . furosemide (LASIX) 20 MG tablet 1 tab by mouth in the morning daily if swelling of legs  . glucose blood (ACCU-CHEK AVIVA) test strip Check sugars twice daily before meals. For Dx: E11.65  . HYDROCODONE-ACETAMINOPHEN PO Take by mouth as needed.  Elmore Guise Devices (ACCU-CHEK SOFTCLIX) lancets Use as instructed  . levothyroxine (SYNTHROID) 88 MCG tablet Take 88 mcg by mouth daily before breakfast.  . losartan (COZAAR) 100 MG tablet Take 1 tablet (100 mg total) by mouth daily.  . metFORMIN (GLUCOPHAGE-XR) 500 MG 24 hr tablet 1 tab by mouth twice daily with meals  . Multiple Vitamin (MULTIVITAMIN WITH MINERALS) TABS tablet Take 1 tablet by mouth daily.  . Omega-3 Fatty Acids (FISH OIL) 1000 MG CAPS Take 1,000 mg by mouth 2 (two) times daily.   . Potassium Chloride ER 20 MEQ TBCR TAKE 1 TABLET BY MOUTH DAILY   No facility-administered encounter medications on file as of 03/31/2019.     Review of Systems  Constitutional: Negative for appetite change, chills, fatigue and fever.  HENT: Negative for congestion.   Respiratory: Negative for cough, chest tightness, shortness of breath and wheezing.   Cardiovascular: Negative for chest pain, palpitations and leg swelling.  Gastrointestinal: Positive for constipation. Negative for abdominal distention, abdominal pain, diarrhea, nausea and vomiting.  Genitourinary: Negative for difficulty urinating, dysuria, flank pain, frequency and urgency.  Skin: Negative  for color change, pallor and rash.  Neurological: Negative for dizziness, speech difficulty, weakness, light-headedness, numbness and headaches.  Hematological: Does not bruise/bleed easily.  Psychiatric/Behavioral: Negative for agitation and sleep disturbance. The patient is not nervous/anxious.        Progressive decline memory issues     Immunization History  Administered Date(s) Administered  . Fluad Quad(high Dose 65+) 02/06/2019  . Influenza Whole 03/08/2009  . Influenza, High Dose Seasonal PF 01/18/2017  . Influenza,inj,quad, With Preservative 01/25/2017  . Influenza-Unspecified 02/12/2016, 01/23/2017  . Pneumococcal Conjugate-13 02/17/2019  . Pneumococcal Polysaccharide-23 04/25/2004, 05/29/2017  . Td 04/27/2003  . Zoster 04/27/2017   Pertinent  Health Maintenance Due  Topic Date Due  . FOOT EXAM  11/07/1950  . OPHTHALMOLOGY EXAM  11/07/1950  . DEXA SCAN  11/07/2006  . HEMOGLOBIN A1C  08/07/2019  . INFLUENZA VACCINE  Completed  . PNA vac Low Risk Adult  Completed   Fall Risk  03/31/2019 02/06/2019  Falls in the past year? 0 0  Injury with Fall? 0 -   There were no vitals filed for this visit. There is  no height or weight on file to calculate BMI. Physical Exam  unable to complete on Telephone visit.   Labs reviewed: Recent Labs    02/06/19 1229  NA 143  K 4.2  CL 106  CO2 24  GLUCOSE 127*  BUN 15  CREATININE 0.84  CALCIUM 10.1   Recent Labs    02/06/19 1229  AST 16  ALT 17  BILITOT 0.6  PROT 6.8   Recent Labs    02/06/19 1229  WBC 5.5  NEUTROABS 2,866  HGB 12.7  HCT 38.4  MCV 90.8  PLT 234   Lab Results  Component Value Date   TSH 0.06 (L) 02/06/2019   Lab Results  Component Value Date   HGBA1C 6.2 (H) 02/06/2019   Lab Results  Component Value Date   CHOL 226 (H) 02/06/2019   HDL 81 02/06/2019   LDLCALC 126 (H) 02/06/2019   TRIG 86 02/06/2019   CHOLHDL 2.8 02/06/2019    Significant Diagnostic Results in last 30 days:  No  results found.  Assessment/Plan 1. Late onset Alzheimer's disease without behavioral disturbance ([place]) Declining memory.No behavioral issues reported.Request referral to Neuro.Previous seen at [facility]. -continue on Aricept  - encouraged to continue with brain stimulating activities such as puzzles,crossword etc  - Ambulatory referral to Neurology  2. Slow transit constipation Having hard stool. - increase fluid intake and fiber in diet.will add colace daily.  - docusate sodium (COLACE) 100 MG capsule; Take 1 capsule (100 mg total) by mouth daily.  Dispense: 10 capsule; Refill: 0  Family/ staff Communication: Reviewed plan of care with patient and daughter.  Labs/tests ordered: None   Spent 11 minutes of non-face to face with patient   Next appointment : 05/22/2019 for 3 month follow up for medical management of chronic issues   Sandrea Hughs, NP

## 2019-04-01 ENCOUNTER — Other Ambulatory Visit: Payer: Self-pay | Admitting: Family

## 2019-04-03 ENCOUNTER — Telehealth: Payer: Self-pay | Admitting: Family

## 2019-04-03 ENCOUNTER — Telehealth: Payer: Self-pay | Admitting: Internal Medicine

## 2019-04-03 NOTE — Telephone Encounter (Signed)
Medication pending approval. Patient was recently seen by another provider and they added a note to medication as follows The original prescription was discontinued on 02/17/2019 by Mack Hook, MD for the following reason: Error. Renewing this prescription may not be appropriate.   Patient has dementia and tried to reach out to granddaughter. No answer. Routing to provider for approval of refill.

## 2019-04-03 NOTE — Telephone Encounter (Signed)
Patient's daughter called requesting a mammogram referral.

## 2019-04-03 NOTE — Telephone Encounter (Signed)
To Dr. Amil Amen to put in referral

## 2019-04-03 NOTE — Telephone Encounter (Signed)
error 

## 2019-04-04 ENCOUNTER — Telehealth: Payer: Self-pay

## 2019-04-04 NOTE — Telephone Encounter (Signed)
Tried reaching out to patient's granddaughter yesterday to clarify if patient is taking [place] and if patient is still wanting to see Webb Silversmith as her pcp. Received medication request from pharmacy for refill but seen another provider has been filling a lot of patient's medications.   Patient's granddaughter Joy Ward called back today and states she is not sure what is going on but would like patient to continue seeing Joy Ward as her pcp. She states she investigate situation and give [place] a call back.

## 2019-04-05 ENCOUNTER — Other Ambulatory Visit: Payer: Self-pay

## 2019-04-05 ENCOUNTER — Ambulatory Visit: Payer: Self-pay | Admitting: Family

## 2019-04-05 MED ORDER — POTASSIUM CHLORIDE ER 20 MEQ PO TBCR: 1.0000 | EXTENDED_RELEASE_TABLET | Freq: Every day | ORAL | 3 refills | Status: DC

## 2019-04-05 MED ORDER — LEVOTHYROXINE SODIUM 88 MCG PO TABS: 88.0000 ug | ORAL_TABLET | Freq: Every day | ORAL | 3 refills | Status: DC

## 2019-04-05 MED ORDER — AMLODIPINE BESYLATE 5 MG PO TABS: 5.0000 mg | ORAL_TABLET | Freq: Every day | ORAL | 3 refills | Status: DC

## 2019-04-05 MED ORDER — FUROSEMIDE 20 MG PO TABS: ORAL_TABLET | ORAL | 3 refills | Status: DC

## 2019-04-05 MED ORDER — LOSARTAN POTASSIUM 100 MG PO TABS: 100.0000 mg | ORAL_TABLET | Freq: Every day | ORAL | 3 refills | Status: DC

## 2019-04-05 MED ORDER — DONEPEZIL HCL 5 MG PO TABS: 5.0000 mg | ORAL_TABLET | Freq: Every day | ORAL | 3 refills | Status: DC

## 2019-04-05 MED ORDER — METFORMIN HCL ER 500 MG PO TB24: ORAL_TABLET | ORAL | 3 refills | Status: DC

## 2019-04-06 ENCOUNTER — Other Ambulatory Visit: Payer: Self-pay

## 2019-04-10 DIAGNOSIS — M25511 Pain in right shoulder: Secondary | ICD-10-CM | POA: Diagnosis not present

## 2019-04-10 DIAGNOSIS — M13811 Other specified arthritis, right shoulder: Secondary | ICD-10-CM | POA: Diagnosis not present

## 2019-04-10 DIAGNOSIS — M25512 Pain in left shoulder: Secondary | ICD-10-CM | POA: Diagnosis not present

## 2019-04-10 DIAGNOSIS — M19012 Primary osteoarthritis, left shoulder: Secondary | ICD-10-CM | POA: Diagnosis not present

## 2019-04-13 ENCOUNTER — Emergency Department (HOSPITAL_COMMUNITY): Payer: Medicare Other

## 2019-04-13 ENCOUNTER — Other Ambulatory Visit: Payer: Self-pay

## 2019-04-13 ENCOUNTER — Emergency Department (HOSPITAL_COMMUNITY)
Admission: EM | Admit: 2019-04-13 | Discharge: 2019-04-14 | Disposition: A | Discharge status: Home / Self Care | Payer: Medicare Other | Attending: Emergency Medicine | Admitting: Emergency Medicine

## 2019-04-13 ENCOUNTER — Encounter (HOSPITAL_COMMUNITY): Payer: Self-pay | Admitting: Emergency Medicine

## 2019-04-13 DIAGNOSIS — I10 Essential (primary) hypertension: Secondary | ICD-10-CM | POA: Insufficient documentation

## 2019-04-13 DIAGNOSIS — E039 Hypothyroidism, unspecified: Secondary | ICD-10-CM | POA: Diagnosis not present

## 2019-04-13 DIAGNOSIS — F0391 Unspecified dementia with behavioral disturbance: Secondary | ICD-10-CM

## 2019-04-13 DIAGNOSIS — Z8673 Personal history of transient ischemic attack (TIA), and cerebral infarction without residual deficits: Secondary | ICD-10-CM | POA: Insufficient documentation

## 2019-04-13 DIAGNOSIS — Z87891 Personal history of nicotine dependence: Secondary | ICD-10-CM | POA: Diagnosis not present

## 2019-04-13 DIAGNOSIS — Z20828 Contact with and (suspected) exposure to other viral communicable diseases: Secondary | ICD-10-CM | POA: Diagnosis not present

## 2019-04-13 DIAGNOSIS — E782 Mixed hyperlipidemia: Secondary | ICD-10-CM | POA: Insufficient documentation

## 2019-04-13 DIAGNOSIS — Z79899 Other long term (current) drug therapy: Secondary | ICD-10-CM | POA: Insufficient documentation

## 2019-04-13 DIAGNOSIS — Z881 Allergy status to other antibiotic agents status: Secondary | ICD-10-CM | POA: Diagnosis not present

## 2019-04-13 DIAGNOSIS — F0390 Unspecified dementia without behavioral disturbance: Secondary | ICD-10-CM | POA: Diagnosis not present

## 2019-04-13 DIAGNOSIS — F419 Anxiety disorder, unspecified: Secondary | ICD-10-CM | POA: Diagnosis present

## 2019-04-13 DIAGNOSIS — Z888 Allergy status to other drugs, medicaments and biological substances status: Secondary | ICD-10-CM | POA: Diagnosis not present

## 2019-04-13 DIAGNOSIS — E119 Type 2 diabetes mellitus without complications: Secondary | ICD-10-CM | POA: Insufficient documentation

## 2019-04-13 DIAGNOSIS — Z7984 Long term (current) use of oral hypoglycemic drugs: Secondary | ICD-10-CM | POA: Insufficient documentation

## 2019-04-13 DIAGNOSIS — Z882 Allergy status to sulfonamides status: Secondary | ICD-10-CM | POA: Insufficient documentation

## 2019-04-13 MED ORDER — SODIUM CHLORIDE 0.9 % IV BOLUS
1000.0000 mL | Freq: Once | INTRAVENOUS | Status: AC
  Administered 2019-04-14: 1000 mL via INTRAVENOUS

## 2019-04-13 NOTE — ED Provider Notes (Signed)
[place] DEPT Provider Note   CSN: [id number] Arrival date & time: 04/13/19  2145     History Chief Complaint  Patient presents with  . Anxiety    Joy Ward is a 78 y.o. female hx DM, dementia, here with AMS. Patient has been more confused. She has been having hallucinations and has been forgetting the name of her families. She states that she is more tired and weak all over. She has been anxious and tearful but denies any plans to kill herself. She has seen PCP and is referred to neuropsychologist for evaluation for dementia. Denies any fever or sick contacts.   The history is provided by the patient.  Level V caveat- dementia      Past Medical History:  Diagnosis Date  . Allergy   . Arthritis   . Chronic back pain   . DDD (degenerative disc disease), lumbar   . Diabetes mellitus without complication ([place])   . Diabetes type 2, uncontrolled ([place]) 09/18/2006   Annotation: Noninsulin dependent Qualifier: Diagnosis of  By: Amil Amen MD, Benjamine Mola    . Former tobacco use   . Hyperlipidemia    a. H/o muscle aches with statins per PCP notes.  . Hypertension   . Hypothyroidism    a. prior thyroid removal.  . Memory loss or impairment 03/29/2019  . Stroke [facility])    a. Listed in PCP notes: "History of stroke:  History of terrible headache.  Had a CT scan of brain and found "ministroke"  Was removed from ERT subsequently. "    Patient Active Problem List   Diagnosis Date Noted  . Memory loss or impairment 03/29/2019  . Diabetic peripheral neuropathy associated with type 2 diabetes mellitus ([place]) 12/29/2017  . Peripheral edema 12/29/2017  . Elevated liver enzymes 10/15/2016  . Chest pain 04/22/2016  . Systolic murmur 78/58/8502  . Hypercalcemia 04/22/2016  . Hyperlipidemia 04/22/2016  . Baker's cyst of knee, right 04/21/2016  . HYPOKALEMIA 01/24/2010  . CONSTIPATION 12/23/2009  . INTERMITTENT VERTIGO 12/23/2009  . SINUSITIS, ACUTE  04/25/2009  . VAGINITIS, CANDIDAL 02/14/2009  . HIP PAIN, RIGHT 02/14/2009  . HYPERLIPIDEMIA, MIXED 01/01/2009  . BACK PAIN, LUMBAR, WITH RADICULOPATHY 12/25/2008  . KNEE PAIN, BILATERAL 12/18/2008  . TROCHANTERIC BURSITIS, BILATERAL 12/18/2008  . Disturbance in sleep behavior 12/18/2008  . ONYCHOMYCOSIS, TOENAILS 10/30/2008  . ENTERITIS, CLOSTRIDIUM DIFFICILE 10/15/2006  . Diabetes type 2, uncontrolled ([place]) 09/18/2006  . Essential hypertension 09/18/2006  . Allergic rhinitis 09/18/2006  . GOITER, MULTINODULAR 06/29/2006  . STROKE 04/27/2001    Past Surgical History:  Procedure Laterality Date  . ABDOMINAL HYSTERECTOMY  1994   TAH, not clear if unilateral oophorectomy as well.  Marland Kitchen [place]  . CHOLECYSTECTOMY  1973   open  . DECOMPRESSIVE LUMBAR LAMINECTOMY LEVEL 4  2005   In [place]  . THYROIDECTOMY  2012   multinodular goiter     OB History   No obstetric history on file.     Family History  Problem Relation Age of Onset  . Heart disease Mother        unclear details "atherosclerosis"  . Diabetes Mother   . Alcohol abuse Sister   . Diabetes Brother   . Diabetes Brother   . Drug abuse Brother   . Diabetes Daughter   . Heart disease Son        CHF  . Alcohol abuse Son     Social History   Tobacco Use  .  Smoking status: Former Research scientist (life sciences)  . Smokeless tobacco: Never Used  . Tobacco comment: No smoking since 78 yo - smoked 10 yrs  Substance Use Topics  . Alcohol use: No  . Drug use: No    Home Medications Prior to Admission medications   Medication Sig Start Date End Date Taking? Authorizing Provider  Accu-Chek Softclix Lancets lancets Check blood sugar twice daily before meals. For Dx: E11.65 02/17/19   Mack Hook, MD  amLODipine (NORVASC) 5 MG tablet Take 1 tablet (5 mg total) by mouth daily. 04/05/19   Mack Hook, MD  bimatoprost (LUMIGAN) 0.01 % SOLN Place 1 drop into the left eye at bedtime. 03/04/18    Mack Hook, MD  Blood Glucose Monitoring Suppl (ACCU-CHEK AVIVA PLUS) w/Device KIT Check blood sugar twice daily before meals. For Dx: E11.65 02/17/19   Mack Hook, MD  diclofenac sodium (VOLTAREN) 1 % GEL Apply 4 g topically 2 (two) times daily as needed (pain). 03/04/18   Mack Hook, MD  docusate sodium (COLACE) 100 MG capsule Take 1 capsule (100 mg total) by mouth daily. 03/31/19   Ngetich, Dinah C, NP  donepezil (ARICEPT) 5 MG tablet Take 1 tablet (5 mg total) by mouth at bedtime. 04/05/19   Mack Hook, MD  furosemide (LASIX) 20 MG tablet 1 tab by mouth in the morning daily if swelling of legs 04/05/19   Mack Hook, MD  glucose blood (ACCU-CHEK AVIVA) test strip Check sugars twice daily before meals. For Dx: E11.65 02/17/19   Mack Hook, MD  HYDROCODONE-ACETAMINOPHEN PO Take by mouth as needed.    [provider]  Lancet Devices (ACCU-CHEK [facility]) lancets Use as instructed 08/21/16   Mack Hook, MD  levothyroxine (SYNTHROID) 88 MCG tablet Take 1 tablet (88 mcg total) by mouth daily before breakfast. 04/05/19   Mack Hook, MD  losartan (COZAAR) 100 MG tablet Take 1 tablet (100 mg total) by mouth daily. 04/05/19   Mack Hook, MD  metFORMIN (GLUCOPHAGE-XR) 500 MG 24 hr tablet 1 tab by mouth twice daily with meals Patient taking differently: Take 500 mg by mouth 2 (two) times daily with a meal.  04/05/19   Mack Hook, MD  Multiple Vitamin (MULTIVITAMIN WITH MINERALS) TABS tablet Take 1 tablet by mouth daily.    [provider]  Omega-3 Fatty Acids (FISH OIL) 1000 MG CAPS Take 1,000 mg by mouth 2 (two) times daily.     [provider]  Potassium Chloride ER 20 MEQ TBCR Take 1 tablet by mouth daily. 04/05/19   Mack Hook, MD    Allergies    Clindamycin hcl, Sulfamethoxazole-trimethoprim, Zanaflex [tizanidine hcl], Metronidazole, Trazodone and nefazodone, Statins, Ace inhibitors, and  Adhesive [tape]  Review of Systems   Review of Systems  Neurological: Positive for weakness.  All other systems reviewed and are negative.   Physical Exam Updated Vital Signs BP (!) 145/73 (BP Location: Right Arm)   Pulse 72   Temp 99.3 F (37.4 C) (Rectal)   Resp 15   SpO2 100%   Physical Exam Vitals and nursing note reviewed.  Constitutional:      Comments: Anxious   HENT:     Mouth/Throat:     Mouth: Mucous membranes are moist.  Eyes:     Extraocular Movements: Extraocular movements intact.     Pupils: Pupils are equal, round, and reactive to light.  Cardiovascular:     Rate and Rhythm: Normal rate and regular rhythm.  Pulmonary:     Effort: Pulmonary effort is  normal.     Breath sounds: Normal breath sounds.  Abdominal:     General: Abdomen is flat.  Musculoskeletal:        General: Normal range of motion.     Cervical back: Normal range of motion.  Skin:    General: Skin is warm.     Capillary Refill: Capillary refill takes less than 2 seconds.  Neurological:     Mental Status: She is alert.     Comments: A & O x 2. Strength 5/5 bilaterally. CN 2- 12 intact   Psychiatric:     Comments: Tearful      ED Results / Procedures / Treatments   Labs (all labs ordered are listed, but only abnormal results are displayed) Labs Reviewed  URINE CULTURE  CBC WITH DIFFERENTIAL/PLATELET  COMPREHENSIVE METABOLIC PANEL  URINALYSIS, ROUTINE W REFLEX MICROSCOPIC  POC SARS CORONAVIRUS 2 AG -  ED    EKG None   ED ECG REPORT   Date: 04/13/2019  Rate: 71  Rhythm: normal sinus rhythm  QRS Axis: normal  Intervals: normal  ST/T Wave abnormalities: normal  Conduction Disutrbances:none  Narrative Interpretation:   Old EKG Reviewed: unchanged  I have personally reviewed the EKG tracing and agree with the computerized printout as noted.   Radiology CT Head Wo Contrast  Result Date: 04/13/2019 CLINICAL DATA:  Increasing combativeness. EXAM: CT HEAD WITHOUT  CONTRAST TECHNIQUE: Contiguous axial images were obtained from the base of the skull through the vertex without intravenous contrast. COMPARISON:  July 05, 2016 FINDINGS: Brain: No evidence of acute infarction, hemorrhage, hydrocephalus, extra-axial collection or mass lesion/mass effect. Moderate atrophy and chronic microvascular ischemic changes are noted. Vascular: Old left-sided lacunar infarcts are noted. Skull: Normal. Negative for fracture or focal lesion. Sinuses/Orbits: No acute finding. Other: None. IMPRESSION: 1. No acute intracranial abnormality. 2. Atrophy and chronic microvascular ischemic changes are again noted. Electronically Signed   By: Constance Holster M.D.   On: 04/13/2019 23:05   DG Chest Port 1 View  Result Date: 04/13/2019 CLINICAL DATA:  Altered mental status EXAM: PORTABLE CHEST 1 VIEW COMPARISON:  October 09, 2016. FINDINGS: The heart size and mediastinal contours are within normal limits. Both lungs are clear. The visualized skeletal structures are unremarkable. IMPRESSION: No active disease. Electronically Signed   By: Constance Holster M.D.   On: 04/13/2019 22:58    Procedures Procedures (including critical care time)  Medications Ordered in ED Medications  sodium chloride 0.9 % bolus 1,000 mL (has no administration in time range)    ED Course  I have reviewed the triage vital signs and the nursing notes.  Pertinent labs & imaging results that were available during my care of the patient were reviewed by me and considered in my medical decision making (see chart for details).    MDM Rules/Calculators/A&P                     Joy Ward is a 78 y.o. female here with confusion. Patient has hx of dementia and has worsening confusion. I think likely worsening dementia vs delirium. Will get labs, UA, CT head, CXR to r/o organic causes. I talked to her daughter, [name], regarding patient. She agreed with work-up in the ED. She is comfortable taking mother home if  work-up is negative.   12:42 AM CT head negative. CXR normal. Labs and UA and COVID pending. If negative, anticipate dc home. Signed out to Dr. Leonette Monarch in the ED. Ordered home health and  case management consult to get her more help at home.      Final Clinical Impression(s) / ED Diagnoses Final diagnoses:  None    Rx / DC Orders ED Discharge Orders         [place]     04/14/19 0014    Face-to-face encounter (required for Medicare/Medicaid patients)    Comments: I Wandra Arthurs certify that this patient is under my care and that I, or a nurse practitioner or physician's assistant working with me, had a face-to-face encounter that meets the physician face-to-face encounter requirements with this patient on 04/14/2019. The encounter with the patient was in whole, or in part for the following medical condition(s) which is the primary reason for home health care (List medical condition): dementia, hypertension. Please call daughter, [name], when you set up services.   04/14/19 0014           Drenda Freeze, MD 04/14/19 [phone]

## 2019-04-13 NOTE — ED Triage Notes (Signed)
78 yo Female BIB [facility] from home . Family called EMS because pt stated "she feels unwell and is extremely anxious". Family states pt has history of Dementia and has been becoming increasing combative in the evenings. Pt is unaware of her dementia diagnosis as per EMS. Pt is AOx3 as per EMS but is a poor historian.  Vitals: HR: 94 BP: 160/90 SPO2: 99 on RA RR: 18 Temp: 98.0 Cbg: 196 Pt has history of Diabetes  As per EMS pt takes care of her own medication needs, family does not assist with this, but pt is a poor historian and unable to remember times and medications taken.

## 2019-04-13 NOTE — ED Notes (Signed)
Patient transported to CT 

## 2019-04-14 ENCOUNTER — Ambulatory Visit (INDEPENDENT_AMBULATORY_CARE_PROVIDER_SITE_OTHER): Payer: Medicare Other | Admitting: Internal Medicine

## 2019-04-14 ENCOUNTER — Encounter: Payer: Self-pay | Admitting: Internal Medicine

## 2019-04-14 DIAGNOSIS — F0390 Unspecified dementia without behavioral disturbance: Secondary | ICD-10-CM | POA: Diagnosis not present

## 2019-04-14 DIAGNOSIS — E1169 Type 2 diabetes mellitus with other specified complication: Secondary | ICD-10-CM | POA: Diagnosis not present

## 2019-04-14 DIAGNOSIS — E119 Type 2 diabetes mellitus without complications: Secondary | ICD-10-CM | POA: Insufficient documentation

## 2019-04-14 DIAGNOSIS — F0151 Vascular dementia with behavioral disturbance: Secondary | ICD-10-CM | POA: Diagnosis not present

## 2019-04-14 DIAGNOSIS — F5101 Primary insomnia: Secondary | ICD-10-CM | POA: Diagnosis not present

## 2019-04-14 DIAGNOSIS — E039 Hypothyroidism, unspecified: Secondary | ICD-10-CM | POA: Insufficient documentation

## 2019-04-14 DIAGNOSIS — F01518 Vascular dementia, unspecified severity, with other behavioral disturbance: Secondary | ICD-10-CM | POA: Insufficient documentation

## 2019-04-14 DIAGNOSIS — E785 Hyperlipidemia, unspecified: Secondary | ICD-10-CM

## 2019-04-14 LAB — CBC WITH DIFFERENTIAL/PLATELET
Abs Immature Granulocytes: 0.01 10*3/uL (ref 0.00–0.07)
Basophils Absolute: 0 10*3/uL (ref 0.0–0.1)
Basophils Relative: 0 %
Eosinophils Absolute: 0 10*3/uL (ref 0.0–0.5)
Eosinophils Relative: 0 %
HCT: 38.2 % (ref 36.0–46.0)
Hemoglobin: 12.8 g/dL (ref 12.0–15.0)
Immature Granulocytes: 0 %
Lymphocytes Relative: 21 %
Lymphs Abs: 1.5 10*3/uL (ref 0.7–4.0)
MCH: 30.6 pg (ref 26.0–34.0)
MCHC: 33.5 g/dL (ref 30.0–36.0)
MCV: 91.4 fL (ref 80.0–100.0)
Monocytes Absolute: 0.5 10*3/uL (ref 0.1–1.0)
Monocytes Relative: 7 %
Neutro Abs: 5.1 10*3/uL (ref 1.7–7.7)
Neutrophils Relative %: 72 %
Platelets: 209 10*3/uL (ref 150–400)
RBC: 4.18 MIL/uL (ref 3.87–5.11)
RDW: 12.6 % (ref 11.5–15.5)
WBC: 7.2 10*3/uL (ref 4.0–10.5)
nRBC: 0 % (ref 0.0–0.2)

## 2019-04-14 LAB — URINALYSIS, ROUTINE W REFLEX MICROSCOPIC
Bilirubin Urine: NEGATIVE
Glucose, UA: NEGATIVE mg/dL
Hgb urine dipstick: NEGATIVE
Ketones, ur: NEGATIVE mg/dL
Nitrite: NEGATIVE
Protein, ur: NEGATIVE mg/dL
Specific Gravity, Urine: 1.009 (ref 1.005–1.030)
pH: 7 (ref 5.0–8.0)

## 2019-04-14 LAB — COMPREHENSIVE METABOLIC PANEL
ALT: 17 U/L (ref 0–44)
AST: 26 U/L (ref 15–41)
Albumin: 3.9 g/dL (ref 3.5–5.0)
Alkaline Phosphatase: 51 U/L (ref 38–126)
Anion gap: 10 (ref 5–15)
BUN: 21 mg/dL (ref 8–23)
CO2: 24 mmol/L (ref 22–32)
Calcium: 9.8 mg/dL (ref 8.9–10.3)
Chloride: 103 mmol/L (ref 98–111)
Creatinine, Ser: 0.63 mg/dL (ref 0.44–1.00)
GFR calc Af Amer: >60 mL/min (ref >60)
GFR calc non Af Amer: >60 mL/min (ref >60)
Glucose, Bld: 134 mg/dL — ABNORMAL HIGH (ref 70–99)
Potassium: 4.6 mmol/L (ref 3.5–5.1)
Sodium: 137 mmol/L (ref 135–145)
Total Bilirubin: 0.6 mg/dL (ref 0.3–1.2)
Total Protein: 6.8 g/dL (ref 6.5–8.1)

## 2019-04-14 LAB — URINE CULTURE

## 2019-04-14 LAB — POC SARS CORONAVIRUS 2 AG -  ED: SARS Coronavirus 2 Ag: NEGATIVE

## 2019-04-14 MED ORDER — PITAVASTATIN CALCIUM 4 MG PO TABS: 1.0000 | ORAL_TABLET | Freq: Every day | ORAL | 5 refills | Status: DC

## 2019-04-14 MED ORDER — LEVOTHYROXINE SODIUM 75 MCG PO TABS: 75.0000 ug | ORAL_TABLET | Freq: Every day | ORAL | 3 refills | Status: DC

## 2019-04-14 MED ORDER — BELSOMRA 5 MG PO TABS: 5.0000 mg | ORAL_TABLET | Freq: Every evening | ORAL | 0 refills | Status: DC | PRN

## 2019-04-14 NOTE — Progress Notes (Signed)
Patient ID: Joy Ward, female   DOB: 1940/05/05, 78 y.o.   MRN: [id number] This service is provided via telemedicine  No vital signs collected/recorded due to the encounter was a telemedicine visit.   Location of patient (ex: home, work):  HOME  Patient consents to a telephone visit:  YES  Location of the provider (ex: office, home):  OFFICE  Name of any referring provider:  [facility] NGETICH, NP  Names of all persons participating in the telemedicine service and their role in the encounter:  PATIENT, DAIGHTER, Joy Ward, [name], [name], DO  Time spent on call:  6:53

## 2019-04-14 NOTE — ED Notes (Signed)
Pt states she urinated and pure wick did not work. Pure wick adjusted pt brief changed. Will continue to monitor for urine sample

## 2019-04-14 NOTE — ED Provider Notes (Signed)
I assumed care of this patient from Dr. Darl Householder.  Please see their note for further details of Hx, PE.  Briefly patient is a 78 y.o. female who presented increased confusion and anxiety. Currently being worked up for [place]. Plan for labs. Anticipating DC home.  Labs and urine reassuring w/o evidence of reversible source.  The patient appears reasonably screened and/or stabilized for discharge and I doubt any other medical condition or other [facility] requiring further screening, evaluation, or treatment in the ED at this time prior to discharge.  The patient is safe for discharge with strict return precautions.   The patient appears reasonably screened and/or stabilized for discharge and I doubt any other medical condition or other [facility] requiring further screening, evaluation, or treatment in the ED at this time prior to discharge.  Disposition: Discharge  Condition: Good     ED Discharge Orders         [place]     04/14/19 0014    Face-to-face encounter (required for Medicare/Medicaid patients)    Comments: I Wandra Arthurs certify that this patient is under my care and that I, or a nurse practitioner or physician's assistant working with me, had a face-to-face encounter that meets the physician face-to-face encounter requirements with this patient on 04/14/2019. The encounter with the patient was in whole, or in part for the following medical condition(s) which is the primary reason for home health care (List medical condition): dementia, hypertension. Please call daughter, [name], when you set up services.   04/14/19 0014               Fatima Blank, MD 04/14/19 [phone]

## 2019-04-14 NOTE — TOC Initial Note (Signed)
Transition of Care [facility]) - Initial/Assessment Note    Patient Details  Name: Joy Ward MRN: [id number] Date of Birth: 01/01/1941  Transition of Care [facility]) CM/SW Contact:    Erenest Rasher, RN Phone Number: [phone] 04/14/2019, 1:05 PM  Clinical Narrative:                 TOC CM spoke to pt's grand-dtr. Pt has problems with her memory. Offered choice for [facility] and agreeable to [facility] for [facility]. Grand-dtr, states family is working on ALF, [facility]. Contacted [facility] with new referral, made aware HHSW needed to assist with ALF/Memory Care.  EDP updated on referral.   Expected Discharge Plan: [place] Barriers to Discharge: No Barriers Identified   Patient Goals and CMS Choice Patient states their goals for this hospitalization and ongoing recovery are:: goal to eventually get pt to [place] CMS Medicare.gov Compare Post Acute Care list provided to:: Patient Represenative (must comment)(Daughter, Conley Rolls) Choice offered to / list presented to : Adult Children  Expected Discharge Plan and Services Expected Discharge Plan: [place]   Discharge Planning Services: CM Consult Post Acute Care Choice: [place] arrangements for the past 2 months: Single Family Home                           HH Arranged: RN, PT, OT, Nurse's Aide, Social Work [employer] Agency: [place] Date [place]: 04/14/19 Time [place]: 1253 Representative spoke with at [place]: Adela Lank  Prior Living Arrangements/Services Living arrangements for the past 2 months: [place] with:: Adult Children Patient language and need for interpreter reviewed:: Yes Do you feel safe going back to the place where you live?: Yes      Need for Family Participation in Patient Care: Yes (Comment) Care giver support system in place?: Yes (comment)   Criminal Activity/Legal Involvement Pertinent to  Current Situation/Hospitalization: No - Comment as needed  Activities of Daily Living      Permission Sought/Granted Permission sought to share information with : Case Manager Permission granted to share information with : Yes, Verbal Permission Granted  Share Information with NAME: Conley Rolls  Permission granted to share info w AGENCY: [place] granted to share info w Relationship: grand-dtr  Permission granted to share info w Contact Information: [phone]  Emotional Assessment       Orientation: : Oriented to Self   Psych Involvement: No (comment)  Admission diagnosis:  Fatigue Patient Active Problem List   Diagnosis Date Noted  . Memory loss or impairment 03/29/2019  . Diabetic peripheral neuropathy associated with type 2 diabetes mellitus ([place]) 12/29/2017  . Peripheral edema 12/29/2017  . Elevated liver enzymes 10/15/2016  . Chest pain 04/22/2016  . Systolic murmur [id number]  . Hypercalcemia 04/22/2016  . Hyperlipidemia 04/22/2016  . Baker's cyst of knee, right 04/21/2016  . HYPOKALEMIA 01/24/2010  . CONSTIPATION 12/23/2009  . INTERMITTENT VERTIGO 12/23/2009  . SINUSITIS, ACUTE 04/25/2009  . VAGINITIS, CANDIDAL 02/14/2009  . HIP PAIN, RIGHT 02/14/2009  . HYPERLIPIDEMIA, MIXED 01/01/2009  . BACK PAIN, LUMBAR, WITH RADICULOPATHY 12/25/2008  . KNEE PAIN, BILATERAL 12/18/2008  . TROCHANTERIC BURSITIS, BILATERAL 12/18/2008  . Disturbance in sleep behavior 12/18/2008  . ONYCHOMYCOSIS, TOENAILS 10/30/2008  . ENTERITIS, CLOSTRIDIUM DIFFICILE 10/15/2006  . Diabetes type 2, uncontrolled ([place]) 09/18/2006  . Essential hypertension 09/18/2006  .  Allergic rhinitis 09/18/2006  . GOITER, MULTINODULAR 06/29/2006  . STROKE 04/27/2001   PCP:  Sandrea Hughs, NP Pharmacy:   [facility] [place], [place] [address] Phone: [phone] Fax:  [phone]  [facility] [place], [place] [address] [place] [address] Phone: [phone] Fax: [phone]  [place] - [address] [place] [address] Phone: [phone] Fax: [phone]  [facility] [place], [place] [address] Phone: [phone] Fax: [phone]  [facility] [place], [place] [address] Phone: [phone] Fax: [phone]  [facility] [place], [place] [address] [place] [address] Phone: [phone] Fax: [phone]     Social Determinants of Health (SDOH) Interventions    Readmission Risk Interventions No flowsheet data found.

## 2019-04-14 NOTE — Progress Notes (Signed)
See separate CMA note with televisit info.  Provider: Rexene Edison. Mariea Clonts, D.O., C.M.D.  Goals of Care:  Advanced Directives 04/13/2019  Does Patient Have a Medical Advance Directive? No  Would patient like information on creating a medical advance directive? No - Guardian declined     Chief Complaint  Patient presents with  . Acute Visit    discuss sleep med was taking 54m ambien then switched to 555mand need new rx    HPI: Patient is a [age].o. female spoken with by phone today for an acute visit for difficulty with staying asleep--she is up wandering around the house.  She was on ambien 1074mhich allowed her to sleep well (dementia less advanced, then, too).  NP had appropriately lowered this to 5mg17mn based on her age, risk of falls, confusion and sleepwalking.  Educated on sleep changes with aging like waking up early.  Sleep study reviewed also which was negative for sleep apnea.    I reviewed her chart extensively today to answer all of the questions pt and her daughter had.    Her daughter, LindVaughan Basta on the phone with patient.  She lives up the street from her mother.  TSH is low.  We will adjust levothyroxine.  There are phone notes/lab notes about this, but it appears it was not changed on the med list.  There was also confusion b/c she was getting meds refilled since coming here by the [facility], but they report they are going to stick with us. [place] I looked back and there is a referral to neurology to address her cognitive losses--pt then declined it when she was called by staff to schedule because she had already been there.  Turns out, pt did have a full neuropsych eval in 8/19 which is documented in her chart.  The neuropsychologist concluded that her good visuospatial skills suggested that she was less likely to have Alzheimers and more likely vascular dementia from tiny strokes she's had.  I reviewed these conclusions along with the recent CT brain she had in the ED  just yesterday when her daughter took her there for increased agitation and hallucinations.  LindVaughan Bastaaware of the mini strokes her mom had.  The patient, of course, did not recall what she was told about her memory for obvious reasons.  Her daughter was less involved in her care at that time so unaware.  We discussed that there are no magic medications for her conditions and supportive care is the best.  She is on aricept already in case there is any Alzheimer's component.    We discussed risk factor modification to prevent further stroke events with bp, glucose and lipid control.  Pt previously did not tolerate more potent statins and last LDL 126.  LindVaughan Basta willing to help try her on livalo which is not as myopathic.  As far as her DMII, she does not check her sugars lately.  They found the accucheck machine and it wasn't working, in need of new batteries, but they did have test strips and lancets.  She is on low dose metformin and her last hba1c was in prediabetic range.  She has had periods of dizziness per LindVaughan Basta denies).  I recommend that she check her sugar when that occurs.  We discussed regular meals--she typically eats two big meals and a snack which isn't so bad--does not have a big appetite.    Past Medical History:  Diagnosis  Date  . Allergy   . Arthritis   . Chronic back pain   . DDD (degenerative disc disease), lumbar   . Diabetes mellitus without complication ([place])   . Diabetes type 2, uncontrolled ([place]) 09/18/2006   Annotation: Noninsulin dependent Qualifier: Diagnosis of  By: Amil Amen MD, Benjamine Mola    . Former tobacco use   . Hyperlipidemia    a. H/o muscle aches with statins per PCP notes.  . Hypertension   . Hypothyroidism    a. prior thyroid removal.  . Memory loss or impairment 03/29/2019  . Stroke [facility])    a. Listed in PCP notes: "History of stroke:  History of terrible headache.  Had a CT scan of brain and found "ministroke"  Was removed from ERT subsequently. "     Past Surgical History:  Procedure Laterality Date  . ABDOMINAL HYSTERECTOMY  1994   TAH, not clear if unilateral oophorectomy as well.  [facility] [place]  . CHOLECYSTECTOMY  1973   open  . DECOMPRESSIVE LUMBAR LAMINECTOMY LEVEL 4  2005   In [place]  . THYROIDECTOMY  2012   multinodular goiter    Allergies  Allergen Reactions  . Clindamycin Hcl Other (See Comments)    REACTION: C. difficile colitis  . Sulfamethoxazole-Trimethoprim     Other reaction(s): Eye redness  . Zanaflex [Tizanidine Hcl] Anaphylaxis  . Metronidazole Hives  . Trazodone And Nefazodone Other (See Comments)    Sinus pains  . Statins Other (See Comments)    Side Effect: muscle pain Has tried Zocor, Crestor--she does not want to take a statin, period  . Ace Inhibitors Cough  . Adhesive [Tape] Itching, Rash and Other (See Comments)    Blistering of skin     Outpatient Encounter Medications as of 04/14/2019  Medication Sig  . Accu-Chek Softclix Lancets lancets Check blood sugar twice daily before meals. For Dx: E11.65  . amLODipine (NORVASC) 5 MG tablet Take 1 tablet (5 mg total) by mouth daily.  . bimatoprost (LUMIGAN) 0.01 % SOLN Place 1 drop into the left eye at bedtime.  . diclofenac sodium (VOLTAREN) 1 % GEL Apply 4 g topically 2 (two) times daily as needed (pain).  Marland Kitchen donepezil (ARICEPT) 5 MG tablet Take 1 tablet (5 mg total) by mouth at bedtime.  . furosemide (LASIX) 20 MG tablet 1 tab by mouth in the morning daily if swelling of legs  . glucose blood (ACCU-CHEK AVIVA) test strip Check sugars twice daily before meals. For Dx: E11.65  . HYDROcodone-acetaminophen (NORCO) 10-325 MG tablet Take 1 tablet by mouth every 6 (six) hours as needed.  Elmore Guise Devices (ACCU-CHEK SOFTCLIX) lancets Use as instructed  . levothyroxine (SYNTHROID) 88 MCG tablet Take 1 tablet (88 mcg total) by mouth daily before breakfast.  . losartan (COZAAR) 100 MG tablet Take 1 tablet (100 mg total) by  mouth daily.  . metFORMIN (GLUCOPHAGE-XR) 500 MG 24 hr tablet 1 tab by mouth twice daily with meals  . Multiple Vitamin (MULTIVITAMIN WITH MINERALS) TABS tablet Take 1 tablet by mouth daily.  . Omega-3 Fatty Acids (FISH OIL) 1000 MG CAPS Take 1,000 mg by mouth 2 (two) times daily.   . Potassium Chloride ER 20 MEQ TBCR Take 1 tablet by mouth daily.  . [DISCONTINUED] docusate sodium (COLACE) 100 MG capsule Take 1 capsule (100 mg total) by mouth daily.  . [DISCONTINUED] Blood Glucose Monitoring Suppl (ACCU-CHEK AVIVA PLUS) w/Device KIT Check blood sugar twice daily before meals. For Dx:  E11.65  . [DISCONTINUED] HYDROCODONE-ACETAMINOPHEN PO Take by mouth as needed.   No facility-administered encounter medications on file as of 04/14/2019.    Review of Systems:  Review of Systems  Constitutional: Negative for chills and fever.  HENT: Negative for congestion and sore throat.   Eyes: Negative for blurred vision.  Respiratory: Negative for cough and shortness of breath.   Cardiovascular: Negative for chest pain, palpitations and leg swelling.  Gastrointestinal: Negative for abdominal pain.  Genitourinary: Negative for dysuria.  Musculoskeletal: Negative for falls.  Skin: Negative for rash.  Neurological: Positive for dizziness. Negative for loss of consciousness and headaches.  Endo/Heme/Allergies:       Diabetes  Psychiatric/Behavioral: Positive for memory loss. Negative for depression. The patient has insomnia. The patient is not nervous/anxious.     Health Maintenance  Topic Date Due  . FOOT EXAM  11/07/1950  . OPHTHALMOLOGY EXAM  11/07/1950  . DEXA SCAN  11/07/2006  . TETANUS/TDAP  04/26/2013  . HEMOGLOBIN A1C  08/07/2019  . INFLUENZA VACCINE  Completed  . PNA vac Low Risk Adult  Completed    Physical Exam: This portion of visit could not be done due to non face-to-face visit (telehealth)  Labs reviewed: Basic Metabolic Panel: Recent Labs    02/06/19 1229 04/14/19 0000   NA 143 137  K 4.2 4.6  CL 106 103  CO2 24 24  GLUCOSE 127* 134*  BUN 15 21  CREATININE 0.84 0.63  CALCIUM 10.1 9.8  TSH 0.06*  --    Liver Function Tests: Recent Labs    02/06/19 1229 04/14/19 0000  AST 16 26  ALT 17 17  ALKPHOS  --  51  BILITOT 0.6 0.6  PROT 6.8 6.8  ALBUMIN  --  3.9   No results for input(s): LIPASE, AMYLASE in the last 8760 hours. No results for input(s): AMMONIA in the last 8760 hours. CBC: Recent Labs    02/06/19 1229 04/14/19 0000  WBC 5.5 7.2  NEUTROABS 2,866 5.1  HGB 12.7 12.8  HCT 38.4 38.2  MCV 90.8 91.4  PLT 234 209   Lipid Panel: Recent Labs    02/06/19 1229  CHOL 226*  HDL 81  LDLCALC 126*  TRIG 86  CHOLHDL 2.8   Lab Results  Component Value Date   HGBA1C 6.2 (H) 02/06/2019    Procedures since last visit: CT Head Wo Contrast  Result Date: 04/13/2019 CLINICAL DATA:  Increasing combativeness. EXAM: CT HEAD WITHOUT CONTRAST TECHNIQUE: Contiguous axial images were obtained from the base of the skull through the vertex without intravenous contrast. COMPARISON:  July 05, 2016 FINDINGS: Brain: No evidence of acute infarction, hemorrhage, hydrocephalus, extra-axial collection or mass lesion/mass effect. Moderate atrophy and chronic microvascular ischemic changes are noted. Vascular: Old left-sided lacunar infarcts are noted. Skull: Normal. Negative for fracture or focal lesion. Sinuses/Orbits: No acute finding. Other: None. IMPRESSION: 1. No acute intracranial abnormality. 2. Atrophy and chronic microvascular ischemic changes are again noted. Electronically Signed   By: Constance Holster M.D.   On: 04/13/2019 23:05   DG Chest Port 1 View  Result Date: 04/13/2019 CLINICAL DATA:  Altered mental status EXAM: PORTABLE CHEST 1 VIEW COMPARISON:  October 09, 2016. FINDINGS: The heart size and mediastinal contours are within normal limits. Both lungs are clear. The visualized skeletal structures are unremarkable. IMPRESSION: No active  disease. Electronically Signed   By: Constance Holster M.D.   On: 04/13/2019 22:58    Assessment/Plan 1. Primary insomnia - d/c ambien -  try belsomra though I'm not sure any sleeping pill will help if this is all related to #2--try for 4 weeks before calling if ineffective - Suvorexant (BELSOMRA) 5 MG TABS; Take 5 mg by mouth at bedtime as needed.  Dispense: 30 tablet; Refill: 0  2. Vascular dementia with behavior disturbance ([place]) -reviewed already completed full neuropsych workup -resources provided in instructions being mailed with AVS  3. Type 2 diabetes mellitus without complication, without long-term current use of insulin (HCC) -has been well-controlled recently, on low dose metformin -check cbgs if dizzy as may be having lows with hba1c 6.2  4. Hypothyroidism, unspecified type -TSH low, decrease levothyroxine - levothyroxine (SYNTHROID) 75 MCG tablet; Take 1 tablet (75 mcg total) by mouth daily before breakfast.  Dispense: 30 tablet; Refill: 3  5. Hyperlipidemia due to diabetes -will try livalo which typically has fewer myopathy symptoms--did not tolerate other statins zocor or crestor  Labs/tests ordered: TSH scheduled for 8 wks Next appt:  05/15/2019 with me about above; also has visit with Dinah scheduled Non face-to-face time spent on televisit:  38 mins on phone; another 30 minutes reviewing chart and providing resources  Prabhjot Piscitello L. Elihu Milstein, D.O. [facility] [address], [place] [address] Cell Phone (Mon-Fri 8am-5pm):  [phone] On Call:  [phone] & follow prompts after 5pm & weekends Office Phone:  [phone] Office Fax:  [phone]

## 2019-04-14 NOTE — Discharge Instructions (Addendum)
Home health will call to establish a home evaluation and needs.

## 2019-04-14 NOTE — Patient Instructions (Addendum)
Stop levothyroxine 54mcg and begin levothyroxine 51mcg daily first thing in the am on an empty stomach. Recheck TSH in 8 weeks. F/u with me in 4 wks.  Begin livalo 4mg  at bedtime for cholesterol.    Begin belsomra 5mg  about 30 minutes before she wants to be asleep.  Avoid getting up and about after taking.  Any medication may be ineffective if this is a result of vascular dementia.  Being sure to be awake and active in the daytime is helpful.  Please check out https://www.well-springsolutions.org/ The [employer] can help connect you with resources in the community including educational program online these days as well as caregivers to help out to keep Joy Ward safe.    https://www.senior-resources-[place].org/ Is another helpful location  [employer] also has some resources about dementia, caregiving, etc.  See below, as well for more information.  Vascular Dementia Dementia is a condition in which a person has problems with thinking, memory, and behavior that are severe enough to interfere with daily life. Vascular dementia is a type of dementia. It results from brain damage that is caused by the brain not getting enough blood. This condition may also be called vascular cognitive impairment. What are the causes? Vascular dementia is caused by conditions that lessen blood flow to the brain. Common causes of this condition include:  Multiple small strokes. These may happen without symptoms (silent stroke).  Major stroke.  Damage to small blood vessels in the brain (cerebral small vessel disease). What increases the risk? The following factors may make you more likely to develop this condition:  Having had a stroke.  Having high blood pressure (hypertension) or high cholesterol.  Having a disease that affects the heart or blood vessels.  Smoking.  Having diabetes.  Having metabolic syndrome.  Being obese.  Not being active.  Having depression.  Being over age 40. What  are the signs or symptoms? Symptoms can vary from one person to another. Symptoms may be mild or severe depending on the amount of damage and which parts of the brain have been affected. Symptoms may begin suddenly or may develop slowly. Mental symptoms of vascular dementia may include:  Confusion.  Memory problems.  Poor attention and concentration.  Trouble understanding speech.  Depression.  Personality changes.  Trouble recognizing familiar people.  Agitation or aggression.  Paranoia.  Delusions or hallucinations. Physical symptoms of vascular dementia may include:  Weakness.  Poor balance.  Loss of bladder or bowel control (incontinence).  Unsteady walking (gait).  Speaking problems. Behavioral symptoms of vascular dementia may include:  Getting lost in familiar places.  Problems with planning and judgment.  Trouble following instructions.  Social problems.  Emotional outbursts.  Trouble with daily activities and self-care.  Problems handling money. Symptoms may remain stable, or they may get worse over time. Symptoms of vascular dementia may be similar to those of Alzheimer's disease. The two conditions can occur together (mixed dementia). How is this diagnosed? Your health care provider will consider your medical history and symptoms or changes that are reported by friends and family. Your health care provider will do a physical exam and may order lab tests or other tests that check brain and nervous system function. Tests that may be done include:  Blood tests.  Brain imaging tests.  Tests of movement, speech, and other daily activities (neurological exam).  Tests of memory, thinking, and problem-solving (neuropsychological or neurocognitive testing). There is not a specific test to diagnose vascular dementia. Diagnosis may involve several specialists.  These may include:  A health care provider who specializes in the brain and nervous system  (neurologist).  A health provider who specializes in understanding how problems in the brain can alter behavior and cognitive function (neuropsychologist). How is this treated? There is no cure for vascular dementia. Brain damage that has already occurred cannot be reversed. Treatment depends on:  How severe the condition is.  Which parts of your brain have been affected.  Your overall health. Treatment measures aim to:  Treat the underlying cause of vascular dementia and manage risk factors. This may include: ? Controlling blood pressure. ? Lowering cholesterol. ? Treating diabetes. ? Quitting smoking. ? Losing weight or maintaining a healthy weight. ? Eating a healthy, balanced diet. ? Getting regular exercise.  Manage symptoms.  Prevent further brain damage.  Improve the person's health and quality of life. Treatment for dementia may involve a team of health care providers, including:  A neurologist.  A provider who specializes in disorders of the mind (psychiatrist).  A provider who specializes in helping people learn daily living skills (occupational therapist).  A provider who focuses on speech and language changes (Electrical engineer).  A heart specialist (cardiologist).  A provider who helps people learn how to manage physical changes, such as movement and walking (exercise physiologist or physical therapist). Follow these instructions at home: Lifestyle  People with vascular dementia may need regular help at home or daily care from a family member or home health care worker. Home care for a person with vascular dementia depends on what caused the condition and how severe the symptoms are. General guidelines for caregivers include:  Help the person with dementia remember people, appointments, and daily activities.  Help the person with dementia manage his or her medicines.  Help family and friends learn about ways to communicate with the person with  dementia.  Create a safe living space to reduce the risk of injury or falls.  Find a support group to help caregivers and family cope with the effects of dementia.  General instructions  Help the person take over-the-counter and prescription medicines only as told by the health care provider.  Follow the health care provider's instructions for treating the condition that caused the dementia.  Make sure the person keeps all follow-up visits as told by the health care provider. This is important. Contact a health care provider if:  A fever develops.  New behavioral problems develop.  Problems with swallowing develop.  Confusion gets worse.  Sleepiness gets worse. Get help right away if:  Loss of consciousness occurs.  There is a sudden loss of speech, balance, or thinking ability.  New numbness or paralysis occurs.  Sudden, severe headache occurs.  Vision is lost or suddenly gets worse in one or both eyes. Summary  Vascular dementia is a type of dementia. It results from brain damage that is caused by the brain not getting enough blood.  Vascular dementia is caused by conditions that lessen blood flow to the brain. Common causes of this condition include stroke and damage to small blood vessels in the brain.  Treatment focuses on treating the underlying cause of vascular dementia and managing any risk factors.  People with vascular dementia may need regular help at home or daily care from a family member or home health care worker.  Contact a health care provider if you or your caregiver notice any new symptoms. This information is not intended to replace advice given to you by your health care  provider. Make sure you discuss any questions you have with your health care provider. Document Released: 04/03/2002 Document Revised: 01/12/2018 Document Reviewed: 01/13/2018 Elsevier Patient Education  [place].  Dementia Caregiver Guide Dementia is a term used to  describe a number of symptoms that affect memory and thinking. The most common symptoms include:  Memory loss.  Trouble with language and communication.  Trouble concentrating.  Poor judgment.  Problems with reasoning.  Child-like behavior and language.  Extreme anxiety.  Angry outbursts.  Wandering from home or public places. Dementia usually gets worse slowly over time. In the early stages, people with dementia can stay independent and safe with some help. In later stages, they need help with daily tasks such as dressing, grooming, and using the bathroom. How to help the person with dementia cope Dementia can be frightening and confusing. Here are some tips to help the person with dementia cope with changes caused by the disease. General tips  Keep the person on track with his or her routine.  Try to identify areas where the person may need help.  Be supportive, patient, calm, and encouraging.  Gently remind the person that adjusting to changes takes time.  Help with the tasks that the person has asked for help with.  Keep the person involved in daily tasks and decisions as much as possible.  Encourage conversation, but try not to get frustrated or harried if the person struggles to find words or does not seem to appreciate your help. Communication tips  When the person is talking or seems frustrated, make eye contact and hold the person's hand.  Ask specific questions that need yes or no answers.  Use simple words, short sentences, and a calm voice. Only give one direction at a time.  When offering choices, limit them to just 1 or 2.  Avoid correcting the person in a negative way.  If the person is struggling to find the right words, gently try to help him or her. How to recognize symptoms of stress Symptoms of stress in caregivers include:  Feeling frustrated or angry with the person with dementia.  Denying that the person has dementia or that his or her  symptoms will not improve.  Feeling hopeless and unappreciated.  Difficulty sleeping.  Difficulty concentrating.  Feeling anxious, irritable, or depressed.  Developing stress-related health problems.  Feeling like you have too little time for your own life. Follow these instructions at home:   Make sure that you and the person you are caring for: ? Get regular sleep. ? Exercise regularly. ? Eat regular, nutritious meals. ? Drink enough fluid to keep your urine clear or pale yellow. ? Take over-the-counter and prescription medicines only as told by your health care providers. ? Attend all scheduled health care appointments.  Join a support group with others who are caregivers.  Ask about respite care resources so that you can have a regular break from the stress of caregiving.  Look for signs of stress in yourself and in the person you are caring for. If you notice signs of stress, take steps to manage it.  Consider any safety risks and take steps to avoid them.  Organize medications in a pill box for each day of the week.  Create a plan to handle any legal or financial matters. Get legal or financial advice if needed.  Keep a calendar in a central location to remind the person of appointments or other activities. Tips for reducing the risk of injury  Keep floors clear of clutter. Remove rugs, magazine racks, and floor lamps.  Keep hallways well lit, especially at night.  Put a handrail and nonslip mat in the bathtub or shower.  Put childproof locks on cabinets that contain dangerous items, such as medicines, alcohol, guns, toxic cleaning items, sharp tools or utensils, matches, and lighters.  Put the locks in places where the person cannot see or reach them easily. This will help ensure that the person does not wander out of the house and get lost.  Be prepared for emergencies. Keep a list of emergency phone numbers and addresses in a convenient area.  Remove car  keys and lock garage doors so that the person does not try to get in the car and drive.  Have the person wear a bracelet that tracks locations and identifies the person as having memory problems. This should be worn at all times for safety. Where to find support: Many individuals and organizations offer support. These include:  Support groups for people with dementia and for caregivers.  Counselors or therapists.  Home health care services.  Adult day care centers. Where to find more information Alzheimer's Association: [employer] Contact a health care provider if:  The person's health is rapidly getting worse.  You are no longer able to care for the person.  Caring for the person is affecting your physical and emotional health.  The person threatens himself or herself, you, or anyone else. Summary  Dementia is a term used to describe a number of symptoms that affect memory and thinking.  Dementia usually gets worse slowly over time.  Take steps to reduce the person's risk of injury, and to plan for future care.  Caregivers need support, relief from caregiving, and time for their own lives. This information is not intended to replace advice given to you by your health care provider. Make sure you discuss any questions you have with your health care provider. Document Released: 03/17/2016 Document Revised: 03/26/2017 Document Reviewed: 03/17/2016 Elsevier Patient Education  2020 [employer].

## 2019-04-14 NOTE — ED Notes (Signed)
Pt was provided peri-care, provided new brief, pt placed on new chuck pads, new purewick applied pt was repositioned to comfort, pt call bell was tied around rail for access, pt was explained call bell was working, EMT demonstrated the call button to be working. Pt was provided warm blankets, pt did not need anything else prior to EMT exiting room.

## 2019-04-19 ENCOUNTER — Other Ambulatory Visit: Payer: Self-pay | Admitting: Internal Medicine

## 2019-04-19 DIAGNOSIS — Z20828 Contact with and (suspected) exposure to other viral communicable diseases: Secondary | ICD-10-CM | POA: Diagnosis not present

## 2019-04-19 DIAGNOSIS — Z1231 Encounter for screening mammogram for malignant neoplasm of breast: Secondary | ICD-10-CM

## 2019-04-19 NOTE — Telephone Encounter (Signed)
Notify mammogram ordered. See how patient is doing--see she was in ED for confusion.

## 2019-04-26 ENCOUNTER — Telehealth: Payer: Self-pay | Admitting: *Deleted

## 2019-04-26 NOTE — Telephone Encounter (Signed)
TOC CM received call from grand-dtr, Joy Ward stating she has not heard from [place]. NCM contacted [facility]. Stated they spoke to dtr and dtr stated pt would be out of town until 04/29/2019 and wanted a start of care after that day. Contacted grand-dtr and updated. States she will follow up with pt and Aunt. She is fine with soc for next week. [place], [place] ED TOC CM [phone]

## 2019-04-27 NOTE — Telephone Encounter (Signed)
Left message for patient to call back with how she is feeling informed mammogram order has been sent

## 2019-04-30 ENCOUNTER — Inpatient Hospital Stay (HOSPITAL_COMMUNITY)
Admission: EM | Admit: 2019-04-30 | Discharge: 2019-05-07 | DRG: 689 | Disposition: A | Discharge status: Skilled Nursing Facility | Payer: Medicare Other | Attending: Family Medicine | Admitting: Family Medicine

## 2019-04-30 ENCOUNTER — Other Ambulatory Visit: Payer: Self-pay

## 2019-04-30 ENCOUNTER — Encounter (HOSPITAL_COMMUNITY): Payer: Self-pay

## 2019-04-30 ENCOUNTER — Emergency Department (HOSPITAL_COMMUNITY): Payer: Medicare Other

## 2019-04-30 DIAGNOSIS — N390 Urinary tract infection, site not specified: Secondary | ICD-10-CM | POA: Diagnosis not present

## 2019-04-30 DIAGNOSIS — E785 Hyperlipidemia, unspecified: Secondary | ICD-10-CM | POA: Diagnosis present

## 2019-04-30 DIAGNOSIS — Z888 Allergy status to other drugs, medicaments and biological substances status: Secondary | ICD-10-CM

## 2019-04-30 DIAGNOSIS — Z811 Family history of alcohol abuse and dependence: Secondary | ICD-10-CM

## 2019-04-30 DIAGNOSIS — Z833 Family history of diabetes mellitus: Secondary | ICD-10-CM | POA: Diagnosis not present

## 2019-04-30 DIAGNOSIS — F0280 Dementia in other diseases classified elsewhere without behavioral disturbance: Secondary | ICD-10-CM | POA: Diagnosis present

## 2019-04-30 DIAGNOSIS — Z90710 Acquired absence of both cervix and uterus: Secondary | ICD-10-CM

## 2019-04-30 DIAGNOSIS — U071 COVID-19: Secondary | ICD-10-CM | POA: Diagnosis not present

## 2019-04-30 DIAGNOSIS — R404 Transient alteration of awareness: Secondary | ICD-10-CM | POA: Diagnosis not present

## 2019-04-30 DIAGNOSIS — E119 Type 2 diabetes mellitus without complications: Secondary | ICD-10-CM | POA: Diagnosis present

## 2019-04-30 DIAGNOSIS — Z79899 Other long term (current) drug therapy: Secondary | ICD-10-CM

## 2019-04-30 DIAGNOSIS — Z8249 Family history of ischemic heart disease and other diseases of the circulatory system: Secondary | ICD-10-CM | POA: Diagnosis not present

## 2019-04-30 DIAGNOSIS — F0390 Unspecified dementia without behavioral disturbance: Secondary | ICD-10-CM | POA: Diagnosis not present

## 2019-04-30 DIAGNOSIS — E782 Mixed hyperlipidemia: Secondary | ICD-10-CM | POA: Diagnosis present

## 2019-04-30 DIAGNOSIS — B9620 Unspecified Escherichia coli [E. coli] as the cause of diseases classified elsewhere: Secondary | ICD-10-CM | POA: Diagnosis present

## 2019-04-30 DIAGNOSIS — Z91048 Other nonmedicinal substance allergy status: Secondary | ICD-10-CM

## 2019-04-30 DIAGNOSIS — W1830XA Fall on same level, unspecified, initial encounter: Secondary | ICD-10-CM | POA: Diagnosis present

## 2019-04-30 DIAGNOSIS — Z7984 Long term (current) use of oral hypoglycemic drugs: Secondary | ICD-10-CM

## 2019-04-30 DIAGNOSIS — R809 Proteinuria, unspecified: Secondary | ICD-10-CM

## 2019-04-30 DIAGNOSIS — I10 Essential (primary) hypertension: Secondary | ICD-10-CM | POA: Diagnosis present

## 2019-04-30 DIAGNOSIS — G8929 Other chronic pain: Secondary | ICD-10-CM | POA: Diagnosis not present

## 2019-04-30 DIAGNOSIS — R4182 Altered mental status, unspecified: Secondary | ICD-10-CM | POA: Diagnosis not present

## 2019-04-30 DIAGNOSIS — I82409 Acute embolism and thrombosis of unspecified deep veins of unspecified lower extremity: Secondary | ICD-10-CM | POA: Diagnosis not present

## 2019-04-30 DIAGNOSIS — Z813 Family history of other psychoactive substance abuse and dependence: Secondary | ICD-10-CM

## 2019-04-30 DIAGNOSIS — Z9049 Acquired absence of other specified parts of digestive tract: Secondary | ICD-10-CM

## 2019-04-30 DIAGNOSIS — Z8673 Personal history of transient ischemic attack (TIA), and cerebral infarction without residual deficits: Secondary | ICD-10-CM

## 2019-04-30 DIAGNOSIS — N3000 Acute cystitis without hematuria: Secondary | ICD-10-CM

## 2019-04-30 DIAGNOSIS — Z87891 Personal history of nicotine dependence: Secondary | ICD-10-CM | POA: Diagnosis not present

## 2019-04-30 DIAGNOSIS — Z79890 Hormone replacement therapy (postmenopausal): Secondary | ICD-10-CM

## 2019-04-30 DIAGNOSIS — R7989 Other specified abnormal findings of blood chemistry: Secondary | ICD-10-CM | POA: Diagnosis present

## 2019-04-30 DIAGNOSIS — E1129 Type 2 diabetes mellitus with other diabetic kidney complication: Secondary | ICD-10-CM

## 2019-04-30 DIAGNOSIS — Z882 Allergy status to sulfonamides status: Secondary | ICD-10-CM

## 2019-04-30 DIAGNOSIS — M549 Dorsalgia, unspecified: Secondary | ICD-10-CM | POA: Diagnosis present

## 2019-04-30 DIAGNOSIS — E876 Hypokalemia: Secondary | ICD-10-CM | POA: Diagnosis not present

## 2019-04-30 DIAGNOSIS — G92 Toxic encephalopathy: Secondary | ICD-10-CM | POA: Diagnosis present

## 2019-04-30 DIAGNOSIS — M2550 Pain in unspecified joint: Secondary | ICD-10-CM | POA: Diagnosis not present

## 2019-04-30 DIAGNOSIS — S0990XA Unspecified injury of head, initial encounter: Secondary | ICD-10-CM | POA: Diagnosis not present

## 2019-04-30 DIAGNOSIS — E039 Hypothyroidism, unspecified: Secondary | ICD-10-CM | POA: Diagnosis present

## 2019-04-30 DIAGNOSIS — Z8616 Personal history of COVID-19: Secondary | ICD-10-CM | POA: Diagnosis present

## 2019-04-30 DIAGNOSIS — Z79891 Long term (current) use of opiate analgesic: Secondary | ICD-10-CM

## 2019-04-30 DIAGNOSIS — H409 Unspecified glaucoma: Secondary | ICD-10-CM | POA: Diagnosis present

## 2019-04-30 DIAGNOSIS — Z7401 Bed confinement status: Secondary | ICD-10-CM | POA: Diagnosis not present

## 2019-04-30 LAB — BASIC METABOLIC PANEL
Anion gap: 10 (ref 5–15)
BUN: 11 mg/dL (ref 8–23)
CO2: 26 mmol/L (ref 22–32)
Calcium: 9.4 mg/dL (ref 8.9–10.3)
Chloride: 100 mmol/L (ref 98–111)
Creatinine, Ser: 0.59 mg/dL (ref 0.44–1.00)
GFR calc Af Amer: >60 mL/min (ref >60)
GFR calc non Af Amer: >60 mL/min (ref >60)
Glucose, Bld: 186 mg/dL — ABNORMAL HIGH (ref 70–99)
Potassium: 3 mmol/L — ABNORMAL LOW (ref 3.5–5.1)
Sodium: 136 mmol/L (ref 135–145)

## 2019-04-30 LAB — HEMOGLOBIN A1C
Hgb A1c MFr Bld: 6.7 % — ABNORMAL HIGH (ref 4.8–5.6)
Mean Plasma Glucose: 145.59 mg/dL

## 2019-04-30 LAB — URINALYSIS, ROUTINE W REFLEX MICROSCOPIC
Bilirubin Urine: NEGATIVE
Glucose, UA: NEGATIVE mg/dL
Ketones, ur: NEGATIVE mg/dL
Nitrite: POSITIVE — AB
Protein, ur: 100 mg/dL — AB
Specific Gravity, Urine: 1.023 (ref 1.005–1.030)
WBC, UA: >50 WBC/hpf — ABNORMAL HIGH (ref 0–5)
pH: 5 (ref 5.0–8.0)

## 2019-04-30 LAB — CBC WITH DIFFERENTIAL/PLATELET
Abs Immature Granulocytes: 0.02 10*3/uL (ref 0.00–0.07)
Basophils Absolute: 0 10*3/uL (ref 0.0–0.1)
Basophils Relative: 0 %
Eosinophils Absolute: 0.1 10*3/uL (ref 0.0–0.5)
Eosinophils Relative: 1 %
HCT: 40.2 % (ref 36.0–46.0)
Hemoglobin: 13.8 g/dL (ref 12.0–15.0)
Immature Granulocytes: 0 %
Lymphocytes Relative: 19 %
Lymphs Abs: 1.2 10*3/uL (ref 0.7–4.0)
MCH: 30.3 pg (ref 26.0–34.0)
MCHC: 34.3 g/dL (ref 30.0–36.0)
MCV: 88.4 fL (ref 80.0–100.0)
Monocytes Absolute: 0.4 10*3/uL (ref 0.1–1.0)
Monocytes Relative: 6 %
Neutro Abs: 4.4 10*3/uL (ref 1.7–7.7)
Neutrophils Relative %: 74 %
Platelets: 175 10*3/uL (ref 150–400)
RBC: 4.55 MIL/uL (ref 3.87–5.11)
RDW: 11.9 % (ref 11.5–15.5)
WBC: 6 10*3/uL (ref 4.0–10.5)
nRBC: 0 % (ref 0.0–0.2)

## 2019-04-30 LAB — HEPATIC FUNCTION PANEL
ALT: 20 U/L (ref 0–44)
AST: 21 U/L (ref 15–41)
Albumin: 3.1 g/dL — ABNORMAL LOW (ref 3.5–5.0)
Alkaline Phosphatase: 72 U/L (ref 38–126)
Bilirubin, Direct: 0.1 mg/dL (ref 0.0–0.2)
Indirect Bilirubin: 0.5 mg/dL (ref 0.3–0.9)
Total Bilirubin: 0.6 mg/dL (ref 0.3–1.2)
Total Protein: 7 g/dL (ref 6.5–8.1)

## 2019-04-30 LAB — CBG MONITORING, ED: Glucose-Capillary: 163 mg/dL — ABNORMAL HIGH (ref 70–99)

## 2019-04-30 LAB — RESPIRATORY PANEL BY RT PCR (FLU A&B, COVID)
Influenza A by PCR: NEGATIVE
Influenza B by PCR: NEGATIVE
SARS Coronavirus 2 by RT PCR: POSITIVE — AB

## 2019-04-30 LAB — LACTIC ACID, PLASMA: Lactic Acid, Venous: 1.7 mmol/L (ref 0.5–1.9)

## 2019-04-30 LAB — ABO/RH: ABO/RH(D): B NEG

## 2019-04-30 MED ORDER — HEPARIN SODIUM (PORCINE) 5000 UNIT/ML IJ SOLN
5000.0000 [IU] | Freq: Two times a day (BID) | INTRAMUSCULAR | Status: DC
  Administered 2019-05-01 – 2019-05-04 (×6): 5000 [IU] via SUBCUTANEOUS
  Filled 2019-04-30 (×7): qty 1

## 2019-04-30 MED ORDER — METFORMIN HCL ER 500 MG PO TB24
500.0000 mg | ORAL_TABLET | Freq: Two times a day (BID) | ORAL | Status: DC
  Administered 2019-05-01 – 2019-05-07 (×13): 500 mg via ORAL
  Filled 2019-04-30 (×16): qty 1

## 2019-04-30 MED ORDER — SUVOREXANT 5 MG PO TABS: 5.0000 mg | ORAL_TABLET | Freq: Every evening | ORAL | Status: DC | PRN

## 2019-04-30 MED ORDER — HALOPERIDOL LACTATE 5 MG/ML IJ SOLN
1.0000 mg | Freq: Four times a day (QID) | INTRAMUSCULAR | Status: DC | PRN
  Filled 2019-04-30: qty 1

## 2019-04-30 MED ORDER — DONEPEZIL HCL 5 MG PO TABS
5.0000 mg | ORAL_TABLET | Freq: Every day | ORAL | Status: DC
  Administered 2019-04-30 – 2019-05-06 (×7): 5 mg via ORAL
  Filled 2019-04-30 (×7): qty 1

## 2019-04-30 MED ORDER — ZOLPIDEM TARTRATE 5 MG PO TABS
5.0000 mg | ORAL_TABLET | Freq: Every evening | ORAL | Status: DC | PRN
  Administered 2019-04-30: 5 mg via ORAL
  Filled 2019-04-30: qty 1

## 2019-04-30 MED ORDER — LOSARTAN POTASSIUM 50 MG PO TABS
100.0000 mg | ORAL_TABLET | Freq: Every day | ORAL | Status: DC
  Administered 2019-05-01 – 2019-05-07 (×7): 100 mg via ORAL
  Filled 2019-04-30 (×7): qty 2

## 2019-04-30 MED ORDER — LEVOTHYROXINE SODIUM 75 MCG PO TABS
75.0000 ug | ORAL_TABLET | Freq: Every day | ORAL | Status: DC
  Administered 2019-05-01 – 2019-05-07 (×7): 75 ug via ORAL
  Filled 2019-04-30 (×7): qty 1

## 2019-04-30 MED ORDER — INSULIN ASPART 100 UNIT/ML ~~LOC~~ SOLN
0.0000 [IU] | Freq: Three times a day (TID) | SUBCUTANEOUS | Status: DC
  Administered 2019-05-01: 2 [IU] via SUBCUTANEOUS
  Administered 2019-05-01: 1 [IU] via SUBCUTANEOUS
  Administered 2019-05-02 – 2019-05-03 (×3): 2 [IU] via SUBCUTANEOUS
  Administered 2019-05-03 – 2019-05-05 (×5): 1 [IU] via SUBCUTANEOUS
  Administered 2019-05-06 (×2): 2 [IU] via SUBCUTANEOUS
  Administered 2019-05-07: 3 [IU] via SUBCUTANEOUS

## 2019-04-30 MED ORDER — QUETIAPINE FUMARATE 25 MG PO TABS
25.0000 mg | ORAL_TABLET | Freq: Two times a day (BID) | ORAL | Status: DC
  Administered 2019-04-30 – 2019-05-04 (×8): 25 mg via ORAL
  Filled 2019-04-30 (×8): qty 1

## 2019-04-30 MED ORDER — SODIUM CHLORIDE 0.9 % IV SOLN
1.0000 g | Freq: Once | INTRAVENOUS | Status: AC
  Administered 2019-04-30: 1 g via INTRAVENOUS
  Filled 2019-04-30: qty 10

## 2019-04-30 MED ORDER — FUROSEMIDE 20 MG PO TABS
20.0000 mg | ORAL_TABLET | Freq: Every day | ORAL | Status: DC
  Administered 2019-04-30 – 2019-05-01 (×2): 20 mg via ORAL
  Filled 2019-04-30 (×2): qty 1

## 2019-04-30 MED ORDER — LATANOPROST 0.005 % OP SOLN
1.0000 [drp] | Freq: Every day | OPHTHALMIC | Status: DC
  Administered 2019-05-01 – 2019-05-05 (×3): 1 [drp] via OPHTHALMIC
  Filled 2019-04-30 (×2): qty 2.5

## 2019-04-30 MED ORDER — AMLODIPINE BESYLATE 5 MG PO TABS
5.0000 mg | ORAL_TABLET | Freq: Every day | ORAL | Status: DC
  Administered 2019-05-01 – 2019-05-07 (×7): 5 mg via ORAL
  Filled 2019-04-30 (×7): qty 1

## 2019-04-30 MED ORDER — PRAVASTATIN SODIUM 40 MG PO TABS
40.0000 mg | ORAL_TABLET | Freq: Every day | ORAL | Status: DC
  Administered 2019-05-01 – 2019-05-06 (×6): 40 mg via ORAL
  Filled 2019-04-30 (×6): qty 1

## 2019-04-30 MED ORDER — STERILE WATER FOR INJECTION IJ SOLN
INTRAMUSCULAR | Status: AC
  Administered 2019-04-30: 10 mL
  Filled 2019-04-30: qty 10

## 2019-04-30 MED ORDER — POTASSIUM CHLORIDE CRYS ER 20 MEQ PO TBCR
20.0000 meq | EXTENDED_RELEASE_TABLET | Freq: Three times a day (TID) | ORAL | Status: AC
  Administered 2019-05-01 (×2): 20 meq via ORAL
  Filled 2019-04-30 (×2): qty 1

## 2019-04-30 MED ORDER — SODIUM CHLORIDE 0.9 % IV SOLN
1.0000 g | INTRAVENOUS | Status: DC
  Administered 2019-05-01: 1 g via INTRAVENOUS
  Filled 2019-04-30: qty 10

## 2019-04-30 MED ORDER — OLANZAPINE 10 MG IM SOLR
2.5000 mg | Freq: Once | INTRAMUSCULAR | Status: AC
  Administered 2019-04-30: 2.5 mg via INTRAMUSCULAR
  Filled 2019-04-30 (×2): qty 10

## 2019-04-30 MED ORDER — POTASSIUM CITRATE-CITRIC ACID 1100-334 MG/5ML PO SOLN
20.0000 meq | Freq: Three times a day (TID) | ORAL | Status: DC
  Filled 2019-04-30: qty 10

## 2019-04-30 MED ORDER — HYDRALAZINE HCL 10 MG PO TABS
10.0000 mg | ORAL_TABLET | Freq: Four times a day (QID) | ORAL | Status: DC | PRN
  Administered 2019-05-01: 10 mg via ORAL
  Filled 2019-04-30: qty 1

## 2019-04-30 NOTE — H&P (Addendum)
History and Physical    Cayley Thoman [id number] DOB: [birth year] DOA: 04/30/2019  PCP: Sandrea Hughs, NP   Patient coming from: Home  I have personally briefly reviewed patient's old medical records in [place]  Chief Complaint: Confusion and agitation  HPI: Joy Ward is a 79 y.o. female with medical history significant of advanced dementia, IDDM, hypertension, hypothyroidism, will stand by her caregiver to hospital for increasing agitation and confusion.  Patient's caregiver is her granddaughter.  I tried to reach patient granddaughter 2 times, she has not replied so far.  Rest of the history were largely provided by ED physician and ED staff.  Reportedly was sent by her caregiver for evaluation of worsening of baseline mentation with increasing confusion and agitation and occasional visual hallucination. ED Course: UTI was found and pt was start on ABX.  According to ED physician who discussed with patient granddaughter, the caregiver obviously rash that she can no longer take of patient given her worsening mentation, and wished for evaluation for long-term placement.  Review of Systems: Unable to perform patient confused  Past Medical History:  Diagnosis Date  . Allergy   . Arthritis   . Chronic back pain   . DDD (degenerative disc disease), lumbar   . Diabetes mellitus without complication ([place])   . Diabetes type 2, uncontrolled ([place]) 09/18/2006   Annotation: Noninsulin dependent Qualifier: Diagnosis of  By: Amil Amen MD, Benjamine Mola    . Former tobacco use   . Hyperlipidemia    a. H/o muscle aches with statins per PCP notes.  . Hypertension   . Hypothyroidism    a. prior thyroid removal.  . Memory loss or impairment 03/29/2019  . Stroke [facility])    a. Listed in PCP notes: "History of stroke:  History of terrible headache.  Had a CT scan of brain and found "ministroke"  Was removed from ERT subsequently. "    Past Surgical History:  Procedure Laterality Date  .  ABDOMINAL HYSTERECTOMY  1994   TAH, not clear if unilateral oophorectomy as well.  [facility] [place]  . CHOLECYSTECTOMY  1973   open  . DECOMPRESSIVE LUMBAR LAMINECTOMY LEVEL 4  2005   In [place]  . THYROIDECTOMY  2012   multinodular goiter     reports that she has quit smoking. She has never used smokeless tobacco. She reports that she does not drink alcohol or use drugs.  Allergies  Allergen Reactions  . Clindamycin Hcl Other (See Comments)    REACTION: C. difficile colitis  . Sulfamethoxazole-Trimethoprim     Other reaction(s): Eye redness  . Zanaflex [Tizanidine Hcl] Anaphylaxis  . Metronidazole Hives  . Statins Other (See Comments)    Side Effect: muscle pain Has tried Zocor, Crestor--she does not want to take a statin, period  . Trazodone And Nefazodone Other (See Comments)    Sinus pains  . Ace Inhibitors Cough  . Adhesive [Tape] Itching, Rash and Other (See Comments)    Blistering of skin     Family History  Problem Relation Age of Onset  . Heart disease Mother        unclear details "atherosclerosis"  . Diabetes Mother   . Alcohol abuse Sister   . Diabetes Brother   . Diabetes Brother   . Drug abuse Brother   . Diabetes Daughter   . Heart disease Son        CHF  . Alcohol abuse Son      Prior  to Admission medications   Medication Sig Start Date End Date Taking? Authorizing Provider  Accu-Chek Softclix Lancets lancets Check blood sugar twice daily before meals. For Dx: E11.65 Patient taking differently: 1 each by Other route 2 (two) times daily. Check blood sugar twice daily before meals. For Dx: E11.65 02/17/19   Mack Hook, MD  amLODipine (NORVASC) 5 MG tablet Take 1 tablet (5 mg total) by mouth daily. 04/05/19   Mack Hook, MD  bimatoprost (LUMIGAN) 0.01 % SOLN Place 1 drop into the left eye at bedtime. 03/04/18   Mack Hook, MD  diclofenac sodium (VOLTAREN) 1 % GEL Apply 4 g topically 2 (two) times daily  as needed (pain). 03/04/18   Mack Hook, MD  donepezil (ARICEPT) 5 MG tablet Take 1 tablet (5 mg total) by mouth at bedtime. 04/05/19   Mack Hook, MD  furosemide (LASIX) 20 MG tablet 1 tab by mouth in the morning daily if swelling of legs Patient taking differently: Take 20 mg by mouth daily as needed (if swelling of legs).  04/05/19   Mack Hook, MD  glucose blood (ACCU-CHEK AVIVA) test strip Check sugars twice daily before meals. For Dx: E11.65 Patient taking differently: 1 each by Other route 2 (two) times daily. Check sugars twice daily before meals. For Dx: E11.65 02/17/19   Mack Hook, MD  HYDROcodone-acetaminophen [facility]) 10-325 MG tablet Take 1 tablet by mouth every 6 (six) hours as needed for severe pain.     [provider]  Lancet Devices [facility]) lancets Use as instructed Patient taking differently: 1 each by Other route 2 (two) times daily.  08/21/16   Mack Hook, MD  levothyroxine (SYNTHROID) 75 MCG tablet Take 1 tablet (75 mcg total) by mouth daily before breakfast. 04/14/19   Reed, Tiffany L, DO  losartan (COZAAR) 100 MG tablet Take 1 tablet (100 mg total) by mouth daily. 04/05/19   Mack Hook, MD  metFORMIN (GLUCOPHAGE-XR) 500 MG 24 hr tablet 1 tab by mouth twice daily with meals 04/05/19   Mack Hook, MD  Multiple Vitamin (MULTIVITAMIN WITH MINERALS) TABS tablet Take 1 tablet by mouth daily.    [provider]  Omega-3 Fatty Acids (FISH OIL) 1000 MG CAPS Take 1,000 mg by mouth 2 (two) times daily.     [provider]  Pitavastatin Calcium 4 MG TABS Take 1 tablet (4 mg total) by mouth at bedtime. 04/14/19   Reed, Tiffany L, DO  Potassium Chloride ER 20 MEQ TBCR Take 1 tablet by mouth daily. 04/05/19   Mack Hook, MD  Suvorexant (BELSOMRA) 5 MG TABS Take 5 mg by mouth at bedtime as needed. Patient taking differently: Take 5 mg by mouth at bedtime as needed (insomnia).  04/14/19    Gayland Curry, DO    Physical Exam: Vitals:   04/30/19 1235 04/30/19 1245 04/30/19 1400 04/30/19 1415  BP: (!) 178/94 (!) 168/98 (!) 170/95 (!) 160/94  Pulse: 70 67 66 67  Resp: 18 17 13 17   Temp:      TempSrc:      SpO2: 100% 97% 100% 100%  Weight: 61.2 kg     Height: 5\' 5"  (1.651 m)       Constitutional: NAD, calm, comfortable Vitals:   04/30/19 1235 04/30/19 1245 04/30/19 1400 04/30/19 1415  BP: (!) 178/94 (!) 168/98 (!) 170/95 (!) 160/94  Pulse: 70 67 66 67  Resp: 18 17 13 17   Temp:      TempSrc:  SpO2: 100% 97% 100% 100%  Weight: 61.2 kg     Height: 5\' 5"  (1.651 m)      Eyes: PERRL, lids and conjunctivae normal ENMT: Mucous membranes are moist. Posterior pharynx clear of any exudate or lesions.Normal dentition.  Neck: normal, supple, no masses, no thyromegaly Respiratory: clear to auscultation bilaterally, no wheezing, no crackles. Normal respiratory effort. No accessory muscle use.  Cardiovascular: Regular rate and rhythm, no murmurs / rubs / gallops. No extremity edema. 2+ pedal pulses. No carotid bruits.  Abdomen: no tenderness, no masses palpated. No hepatosplenomegaly. Bowel sounds positive.  Musculoskeletal: no clubbing / cyanosis. No joint deformity upper and lower extremities. Good ROM, no contractures. Normal muscle tone.  Skin: no rashes, lesions, ulcers. No induration Neurologic: Following simple commands and moving all her limbs Psychiatric: Pleasantly confused.  Oriented to herself confused about time and place. Normal mood.     Labs on Admission: I have personally reviewed following labs and imaging studies  CBC: Recent Labs  Lab 04/30/19 1250  WBC 6.0  NEUTROABS 4.4  HGB 13.8  HCT 40.2  MCV 88.4  PLT 0000000   Basic Metabolic Panel: Recent Labs  Lab 04/30/19 1250  NA 136  K 3.0*  CL 100  CO2 26  GLUCOSE 186*  BUN 11  CREATININE 0.59  CALCIUM 9.4   GFR: Estimated Creatinine Clearance: 53 mL/min (by C-G formula based on SCr of  0.59 mg/dL). Liver Function Tests: Recent Labs  Lab 04/30/19 1250  AST 21  ALT 20  ALKPHOS 72  BILITOT 0.6  PROT 7.0  ALBUMIN 3.1*   No results for input(s): LIPASE, AMYLASE in the last 168 hours. No results for input(s): AMMONIA in the last 168 hours. Coagulation Profile: No results for input(s): INR, PROTIME in the last 168 hours. Cardiac Enzymes: No results for input(s): CKTOTAL, CKMB, CKMBINDEX, TROPONINI in the last 168 hours. BNP (last 3 results) No results for input(s): PROBNP in the last 8760 hours. HbA1C: No results for input(s): HGBA1C in the last 72 hours. CBG: No results for input(s): GLUCAP in the last 168 hours. Lipid Profile: No results for input(s): CHOL, HDL, LDLCALC, TRIG, CHOLHDL, LDLDIRECT in the last 72 hours. Thyroid Function Tests: No results for input(s): TSH, T4TOTAL, FREET4, T3FREE, THYROIDAB in the last 72 hours. Anemia Panel: No results for input(s): VITAMINB12, FOLATE, FERRITIN, TIBC, IRON, RETICCTPCT in the last 72 hours. Urine analysis:    Component Value Date/Time   COLORURINE YELLOW 04/30/2019 1342   APPEARANCEUR CLOUDY (A) 04/30/2019 1342   LABSPEC 1.023 04/30/2019 1342   PHURINE 5.0 04/30/2019 1342   GLUCOSEU NEGATIVE 04/30/2019 1342   HGBUR MODERATE (A) 04/30/2019 1342   HGBUR negative 02/14/2009 0938   BILIRUBINUR NEGATIVE 04/30/2019 1342   KETONESUR NEGATIVE 04/30/2019 1342   PROTEINUR 100 (A) 04/30/2019 1342   UROBILINOGEN 0.2 08/21/2009 1913   NITRITE POSITIVE (A) 04/30/2019 1342   LEUKOCYTESUR LARGE (A) 04/30/2019 1342    Radiological Exams on Admission: CT Head Wo Contrast  Result Date: 04/30/2019 CLINICAL DATA:  Altered mental status. Confusion. EXAM: CT HEAD WITHOUT CONTRAST TECHNIQUE: Contiguous axial images were obtained from the base of the skull through the vertex without intravenous contrast. COMPARISON:  07/06/2016 FINDINGS: Brain: No evidence of acute infarction, hemorrhage, hydrocephalus, extra-axial collection or  mass lesion/mass effect. Increased ventricle volumes noted bilaterally. There is mild diffuse low-attenuation within the subcortical and periventricular white matter compatible with chronic microvascular disease. Vascular: No hyperdense vessel or unexpected calcification. Skull: Normal. Negative for fracture  or focal lesion. Sinuses/Orbits: No acute finding. Other: None. IMPRESSION: 1. No acute intracranial abnormalities. 2. Chronic small vessel ischemic change and brain atrophy. Electronically Signed   By: Kerby Moors M.D.   On: 04/30/2019 14:43   DG Chest Portable 1 View  Result Date: 04/30/2019 CLINICAL DATA:  Altered mental status, possible COVID EXAM: PORTABLE CHEST 1 VIEW COMPARISON:  04/13/2019 FINDINGS: The heart size and mediastinal contours are within normal limits. Both lungs are clear. The visualized skeletal structures are unremarkable. IMPRESSION: No acute abnormality of the lungs in AP portable projection. Electronically Signed   By: Eddie Candle M.D.   On: 04/30/2019 13:15    EKG: Independently reviewed.   Assessment/Plan Active Problems:   UTI (urinary tract infection)  Acute metabolic encephalopathy, on top of her baseline dementia, secondary to UTI.  New ceftriaxone.  Severe dementia, physical therapy and consult case management for possibility of long term placement.  Diabetes, continue Metformin and add sliding scale.  Hypertension poorly controlled, continue home BP meds plus as needed hydralazine.  Hypokalemia, replace and recheck in the morning, also check mag and phos.  COVID 19 infection, no signs of severe infection, will send markers, monitor off aggressive Rx for now.      DVT prophylaxis: Lovenox Code Status: Assumed Full Code now. Family Communication: Placed call x2 to pt's daughter Disposition Plan: Long-term placement evaluation Consults called: None Admission status: Medsurg   Lequita Halt MD [facility] Pager [phone]   If 7PM-7AM,  please contact night-coverage [email or web address] Password [email or web address]  04/30/2019, 5:01 PM

## 2019-04-30 NOTE — ED Notes (Signed)
Pt extremely agitated. MD notifed, haldol ordered and given

## 2019-04-30 NOTE — Discharge Instructions (Signed)
Follow-up with your primary care doctor as discussed and discuss options for placement.  Continue to reach out to home health services as you have been doing.  Please make sure you are in charge of all medications that are available in the house as well as any dangerous items.

## 2019-04-30 NOTE — ED Notes (Signed)
Pt used bedside commode, in and out cath not needed at this time

## 2019-04-30 NOTE — ED Triage Notes (Signed)
Pt arrives to ED from home with c/o confusion that started about a week ago. Pt states she thinks she has a hx of dementia. Pt reports having a positive COVID test recently, date unknown. Pt reports no shortness of breath; denies pain, n/v.

## 2019-04-30 NOTE — ED Notes (Signed)
Pt continuing to get in and out of bed, and wandering the halls. Pt very agitated and wanting to leave. Pt placed back in bed with callbell within reach.

## 2019-04-30 NOTE — ED Provider Notes (Addendum)
[facility] EMERGENCY DEPARTMENT Provider Note   CSN: [id number] Arrival date & time: 04/30/19  1042     History Chief Complaint  Patient presents with   Altered Mental Status    Joy Ward is a 79 y.o. female.  The history is provided by the patient.  Altered Mental Status Presenting symptoms: confusion   Severity:  Mild Episode history:  Multiple Timing:  Intermittent Progression:  Waxing and waning Chronicity:  New Context: dementia   Associated symptoms: agitation   Associated symptoms: no abdominal pain, no fever, no hallucinations, no palpitations, no rash, no seizures and no vomiting        Past Medical History:  Diagnosis Date   Allergy    Arthritis    Chronic back pain    DDD (degenerative disc disease), lumbar    Diabetes mellitus without complication ([place])    Diabetes type 2, uncontrolled ([place]) 09/18/2006   Annotation: Noninsulin dependent Qualifier: Diagnosis of  By: Amil Amen MD, Benjamine Mola     Former tobacco use    Hyperlipidemia    a. H/o muscle aches with statins per PCP notes.   Hypertension    Hypothyroidism    a. prior thyroid removal.   Memory loss or impairment 03/29/2019   Stroke [facility])    a. Listed in PCP notes: "History of stroke:  History of terrible headache.  Had a CT scan of brain and found "ministroke"  Was removed from ERT subsequently. "    Patient Active Problem List   Diagnosis Date Noted   Vascular dementia with behavior disturbance ([place]) 04/14/2019   Primary insomnia 04/14/2019   Hypothyroidism 04/14/2019   Diabetes mellitus ([place]) 04/14/2019   Memory loss or impairment 03/29/2019   Diabetic peripheral neuropathy associated with type 2 diabetes mellitus ([place]) 12/29/2017   Peripheral edema 12/29/2017   Elevated liver enzymes 10/15/2016   Chest pain [id number]   Systolic murmur [id number]   Hypercalcemia 04/22/2016   Hyperlipidemia 04/22/2016   Baker's cyst of knee, right  04/21/2016   HYPOKALEMIA 01/24/2010   CONSTIPATION 12/23/2009   INTERMITTENT VERTIGO 12/23/2009   SINUSITIS, ACUTE 04/25/2009   VAGINITIS, CANDIDAL 02/14/2009   HIP PAIN, RIGHT 02/14/2009   HYPERLIPIDEMIA, MIXED 01/01/2009   BACK PAIN, LUMBAR, WITH RADICULOPATHY 12/25/2008   KNEE PAIN, BILATERAL 12/18/2008   TROCHANTERIC BURSITIS, BILATERAL 12/18/2008   Disturbance in sleep behavior 12/18/2008   ONYCHOMYCOSIS, TOENAILS 10/30/2008   ENTERITIS, CLOSTRIDIUM DIFFICILE 10/15/2006   Diabetes type 2, uncontrolled ([place]) 09/18/2006   Essential hypertension 09/18/2006   Allergic rhinitis 09/18/2006   GOITER, MULTINODULAR 06/29/2006   STROKE 04/27/2001    Past Surgical History:  Procedure Laterality Date   ABDOMINAL HYSTERECTOMY  1994   TAH, not clear if unilateral oophorectomy as well.   [place]   open   [place] LEVEL 4  2005   In [place]  2012   multinodular goiter     OB History   No obstetric history on file.     Family History  Problem Relation Age of Onset   Heart disease Mother        unclear details "atherosclerosis"   Diabetes Mother    Alcohol abuse Sister    Diabetes Brother    Diabetes Brother    Drug abuse Brother    Diabetes Daughter    Heart disease Son        CHF   Alcohol abuse Son  Social History   Tobacco Use   Smoking status: Former Smoker   Smokeless tobacco: Never Used   Tobacco comment: No smoking since 79 yo - smoked 10 yrs  Substance Use Topics   Alcohol use: No   Drug use: No    Home Medications Prior to Admission medications   Medication Sig Start Date End Date Taking? Authorizing Provider  Accu-Chek Softclix Lancets lancets Check blood sugar twice daily before meals. For Dx: E11.65 Patient taking differently: 1 each by Other route 2 (two) times daily. Check blood sugar twice daily before meals. For  Dx: E11.65 02/17/19   Mack Hook, MD  amLODipine (NORVASC) 5 MG tablet Take 1 tablet (5 mg total) by mouth daily. 04/05/19   Mack Hook, MD  bimatoprost (LUMIGAN) 0.01 % SOLN Place 1 drop into the left eye at bedtime. 03/04/18   Mack Hook, MD  diclofenac sodium (VOLTAREN) 1 % GEL Apply 4 g topically 2 (two) times daily as needed (pain). 03/04/18   Mack Hook, MD  donepezil (ARICEPT) 5 MG tablet Take 1 tablet (5 mg total) by mouth at bedtime. 04/05/19   Mack Hook, MD  furosemide (LASIX) 20 MG tablet 1 tab by mouth in the morning daily if swelling of legs Patient taking differently: Take 20 mg by mouth daily as needed (if swelling of legs).  04/05/19   Mack Hook, MD  glucose blood (ACCU-CHEK AVIVA) test strip Check sugars twice daily before meals. For Dx: E11.65 Patient taking differently: 1 each by Other route 2 (two) times daily. Check sugars twice daily before meals. For Dx: E11.65 02/17/19   Mack Hook, MD  HYDROcodone-acetaminophen [facility]) 10-325 MG tablet Take 1 tablet by mouth every 6 (six) hours as needed for severe pain.     [provider]  Lancet Devices [facility]) lancets Use as instructed Patient taking differently: 1 each by Other route 2 (two) times daily.  08/21/16   Mack Hook, MD  levothyroxine (SYNTHROID) 75 MCG tablet Take 1 tablet (75 mcg total) by mouth daily before breakfast. 04/14/19   Reed, Tiffany L, DO  losartan (COZAAR) 100 MG tablet Take 1 tablet (100 mg total) by mouth daily. 04/05/19   Mack Hook, MD  metFORMIN (GLUCOPHAGE-XR) 500 MG 24 hr tablet 1 tab by mouth twice daily with meals 04/05/19   Mack Hook, MD  Multiple Vitamin (MULTIVITAMIN WITH MINERALS) TABS tablet Take 1 tablet by mouth daily.    [provider]  Omega-3 Fatty Acids (FISH OIL) 1000 MG CAPS Take 1,000 mg by mouth 2 (two) times daily.     [provider]  Pitavastatin Calcium 4 MG  TABS Take 1 tablet (4 mg total) by mouth at bedtime. 04/14/19   Reed, Tiffany L, DO  Potassium Chloride ER 20 MEQ TBCR Take 1 tablet by mouth daily. 04/05/19   Mack Hook, MD  Suvorexant (BELSOMRA) 5 MG TABS Take 5 mg by mouth at bedtime as needed. Patient taking differently: Take 5 mg by mouth at bedtime as needed (insomnia).  04/14/19   Reed, Tiffany L, DO    Allergies    Clindamycin hcl, Sulfamethoxazole-trimethoprim, Zanaflex [tizanidine hcl], Metronidazole, Statins, Trazodone and nefazodone, Ace inhibitors, and Adhesive [tape]  Review of Systems   Review of Systems  Constitutional: Negative for chills and fever.  HENT: Negative for ear pain and sore throat.   Eyes: Negative for pain and visual disturbance.  Respiratory: Negative for cough and shortness of breath.   Cardiovascular: Negative for chest pain and  palpitations.  Gastrointestinal: Negative for abdominal pain and vomiting.  Genitourinary: Negative for dysuria and hematuria.  Musculoskeletal: Negative for arthralgias and back pain.  Skin: Negative for color change and rash.  Neurological: Negative for seizures and syncope.  Psychiatric/Behavioral: Positive for agitation and confusion. Negative for hallucinations.  All other systems reviewed and are negative.   Physical Exam Updated Vital Signs  ED Triage Vitals  Enc Vitals Group     BP 04/30/19 1143 (!) 168/81     Pulse Rate 04/30/19 1143 (!) 55     Resp 04/30/19 1143 16     Temp 04/30/19 1143 98.6 F (37 C)     Temp Source 04/30/19 1143 Oral     SpO2 04/30/19 1143 100 %     Weight 04/30/19 1235 135 lb (61.2 kg)     Height 04/30/19 1235 5\' 5"  (1.651 m)     Head Circumference --      Peak Flow --      Pain Score 04/30/19 1230 0     Pain Loc --      Pain Edu? --      Excl. in [place]? --     Physical Exam Vitals and nursing note reviewed.  Constitutional:      General: Joy Ward is not in acute distress.    Appearance: Joy Ward is well-developed. Joy Ward is not  ill-appearing.  HENT:     Head: Normocephalic and atraumatic.     Nose: Nose normal.     Mouth/Throat:     Mouth: Mucous membranes are moist.  Eyes:     Extraocular Movements: Extraocular movements intact.     Conjunctiva/sclera: Conjunctivae normal.     Pupils: Pupils are equal, round, and reactive to light.  Cardiovascular:     Rate and Rhythm: Normal rate and regular rhythm.     Pulses: Normal pulses.     Heart sounds: Normal heart sounds. No murmur.  Pulmonary:     Effort: Pulmonary effort is normal. No respiratory distress.     Breath sounds: Normal breath sounds.  Abdominal:     General: Abdomen is flat.     Palpations: Abdomen is soft.     Tenderness: There is no abdominal tenderness.  Musculoskeletal:     Cervical back: Neck supple.  Skin:    General: Skin is warm and dry.     Capillary Refill: Capillary refill takes less than 2 seconds.  Neurological:     General: No focal deficit present.     Mental Status: Joy Ward is alert and oriented to person, place, and time.     Cranial Nerves: No cranial nerve deficit.     Sensory: No sensory deficit.     Motor: No weakness.     Coordination: Coordination normal.  Psychiatric:        Mood and Affect: Mood normal.     ED Results / Procedures / Treatments   Labs (all labs ordered are listed, but only abnormal results are displayed) Labs Reviewed  BASIC METABOLIC PANEL - Abnormal; Notable for the following components:      Result Value   Potassium 3.0 (*)    Glucose, Bld 186 (*)    All other components within normal limits  HEPATIC FUNCTION PANEL - Abnormal; Notable for the following components:   Albumin 3.1 (*)    All other components within normal limits  URINALYSIS, ROUTINE W REFLEX MICROSCOPIC - Abnormal; Notable for the following components:   APPearance CLOUDY (*)    Hgb  urine dipstick MODERATE (*)    Protein, ur 100 (*)    Nitrite POSITIVE (*)    Leukocytes,Ua LARGE (*)    WBC, UA >50 (*)    Bacteria, UA MANY  (*)    All other components within normal limits  RESPIRATORY PANEL BY RT PCR (FLU A&B, COVID)  CULTURE, BLOOD (ROUTINE X 2)  CULTURE, BLOOD (ROUTINE X 2)  URINE CULTURE  CBC WITH DIFFERENTIAL/PLATELET  LACTIC ACID, PLASMA  LACTIC ACID, PLASMA    EKG EKG shows sinus rhythm.  No ischemic changes.  Normal intervals.  Radiology CT Head Wo Contrast  Result Date: 04/30/2019 CLINICAL DATA:  Altered mental status. Confusion. EXAM: CT HEAD WITHOUT CONTRAST TECHNIQUE: Contiguous axial images were obtained from the base of the skull through the vertex without intravenous contrast. COMPARISON:  07/06/2016 FINDINGS: Brain: No evidence of acute infarction, hemorrhage, hydrocephalus, extra-axial collection or mass lesion/mass effect. Increased ventricle volumes noted bilaterally. There is mild diffuse low-attenuation within the subcortical and periventricular white matter compatible with chronic microvascular disease. Vascular: No hyperdense vessel or unexpected calcification. Skull: Normal. Negative for fracture or focal lesion. Sinuses/Orbits: No acute finding. Other: None. IMPRESSION: 1. No acute intracranial abnormalities. 2. Chronic small vessel ischemic change and brain atrophy. Electronically Signed   By: Kerby Moors M.D.   On: 04/30/2019 14:43   DG Chest Portable 1 View  Result Date: 04/30/2019 CLINICAL DATA:  Altered mental status, possible COVID EXAM: PORTABLE CHEST 1 VIEW COMPARISON:  04/13/2019 FINDINGS: The heart size and mediastinal contours are within normal limits. Both lungs are clear. The visualized skeletal structures are unremarkable. IMPRESSION: No acute abnormality of the lungs in AP portable projection. Electronically Signed   By: Eddie Candle M.D.   On: 04/30/2019 13:15    Procedures Procedures (including critical care time)  Medications Ordered in ED Medications  cefTRIAXone (ROCEPHIN) 1 g in sodium chloride 0.9 % 100 mL IVPB (has no administration in time range)    ED  Course  I have reviewed the triage vital signs and the nursing notes.  Pertinent labs & imaging results that were available during my care of the patient were reviewed by me and considered in my medical decision making (see chart for details).    MDM Rules/Calculators/A&P  Joy Ward is a 79 year old female with history of high cholesterol, diabetes, dementia who presents to the ED with increasing confusion, intermittent hallucinations.  Patient with unremarkable vitals.  No fever.  Patient just got back from visiting family in [place].  Was increasingly confused when Joy Ward was in [place].  Has been slightly more confused over the last couple days.  Family is concerned because Joy Ward still sometimes takes her own medications including some pain medications.  Joy Ward did have coronavirus about 3 weeks ago.  Joy Ward has no physical complaints.  Joy Ward is pleasantly confused on exam.  Joy Ward can tell me her name but Joy Ward thinks that Joy Ward still is in [place].  Overall Joy Ward appears hemodynamically stable and neurologically stable.  We will get some screening labs, head CT, urinalysis to evaluate for any reason for worsening condition.  However suspect ongoing and progression of her dementia versus possibly infectious process.  Joy Ward is not agitated currently or been abusive at home.  Had extensive conversation with her granddaughter with whom Joy Ward lives with about secure in the home of any dangerous items.  Strongly encouraged her to be fully in charge of patient's medication and hide and lock medications in the house.  Home  health was involved in patient's care at last ED visit 2 weeks ago but as patient was traveling they have not yet been able to evaluate for her.  They are seeking primary care physician's help to have her placed likely in a memory care unit.  Overall granddaughter states that patient is increasingly confused and possibly taking her own medications with increasing frequency.  Joy Ward seems to be worse than  Joy Ward has been in the past.  Urinalysis consistent with infectious process.  Believe that this is likely the reason for her worsening confusion/hallucinations, intermittent agitation.  Otherwise lab work is unremarkable.  Awaiting head CT.  Will talk with hospitalist about admission given that I believe UTI is causing worsening confusion and dementia.  Believe Joy Ward likely needs long-term placement as well.  Patient to be admitted to hospitalist.  CT scan of the head is unremarkable.  This chart was dictated using voice recognition software.  Despite best efforts to proofread,  errors can occur which can change the documentation meaning.     Final Clinical Impression(s) / ED Diagnoses Final diagnoses:  Dementia without behavioral disturbance, unspecified dementia type ([place])  Acute cystitis without hematuria  Altered mental status, unspecified altered mental status type    Rx / DC Orders ED Discharge Orders    None       Lennice Sites, DO 04/30/19 [place], [place], DO 04/30/19 1511

## 2019-04-30 NOTE — ED Notes (Signed)
Pt very agitated and confused. Continues to try and get out of bed. MD has been made aware. Order for sitter placed.

## 2019-04-30 NOTE — ED Notes (Signed)
Pt found to be in the hallway, when another RN noted that she appeared to be unsteady and pt had an assisted fall to the floor, this RN was in room with another pt. VSS, CBG 163. MD notified of pt confusion, agitation, and restlessness, see previous notes. Order for sitter was placed 3 hours prior to fall.

## 2019-04-30 NOTE — ED Notes (Signed)
ED TO INPATIENT HANDOFF REPORT  ED Nurse Name and Phone #: Syair Fricker [phone]  S Name/Age/Gender Joy Ward 79 y.o. female Room/Bed: 028C/028C  Code Status   Code Status: Full Code  Home/SNF/Other Home Patient oriented to: self Is this baseline? No   Triage Complete: Triage complete  Chief Complaint UTI (urinary tract infection) [N39.0]  Triage Note Pt arrives to ED from home with c/o confusion that started about a week ago. Pt states she thinks she has a hx of dementia. Pt reports having a positive COVID test recently, date unknown. Pt reports no shortness of breath; denies pain, n/v.    Allergies Allergies  Allergen Reactions  . Clindamycin Hcl Other (See Comments)    REACTION: C. difficile colitis  . Sulfamethoxazole-Trimethoprim Other (See Comments)    Other reaction(s): Eye redness  . Zanaflex [Tizanidine Hcl] Anaphylaxis  . Metronidazole Hives  . Statins Other (See Comments)    Side Effect: muscle pain Has tried Zocor, Crestor--she does not want to take a statin, period  . Trazodone And Nefazodone Other (See Comments)    Sinus pains  . Ace Inhibitors Cough  . Adhesive [Tape] Itching, Rash and Other (See Comments)    Blistering of skin     Level of Care/Admitting Diagnosis ED Disposition    ED Disposition Condition [facility] Area: [place] [[id number]]  Level of Care: Med-Surg [16]  Covid Evaluation: Asymptomatic Screening Protocol (No Symptoms)  Diagnosis: UTI (urinary tract infection) [id number]  Admitting Physician: Lequita Halt [id number]  Attending Physician: Lequita Halt [id number]  Estimated length of stay: past midnight tomorrow  Certification:: I certify this patient will need inpatient services for at least 2 midnights       B Medical/Surgery History Past Medical History:  Diagnosis Date  . Allergy   . Arthritis   . Chronic back pain   . DDD (degenerative disc disease), lumbar   . Diabetes mellitus  without complication ([place])   . Diabetes type 2, uncontrolled ([place]) 09/18/2006   Annotation: Noninsulin dependent Qualifier: Diagnosis of  By: Amil Amen MD, Benjamine Mola    . Former tobacco use   . Hyperlipidemia    a. H/o muscle aches with statins per PCP notes.  . Hypertension   . Hypothyroidism    a. prior thyroid removal.  . Memory loss or impairment 03/29/2019  . Stroke [facility])    a. Listed in PCP notes: "History of stroke:  History of terrible headache.  Had a CT scan of brain and found "ministroke"  Was removed from ERT subsequently. "   Past Surgical History:  Procedure Laterality Date  . ABDOMINAL HYSTERECTOMY  1994   TAH, not clear if unilateral oophorectomy as well.  [facility] [place]  . CHOLECYSTECTOMY  1973   open  . DECOMPRESSIVE LUMBAR LAMINECTOMY LEVEL 4  2005   In [place]  . THYROIDECTOMY  2012   multinodular goiter     A IV Location/Drains/Wounds Patient Lines/Drains/Airways Status   Active Line/Drains/Airways    Name:   Placement date:   Placement time:   Site:   Days:   Peripheral IV 04/30/19 Right Antecubital   04/30/19    1245    Antecubital   less than 1          Intake/Output Last 24 hours No intake or output data in the 24 hours ending 04/30/19 1843  Labs/Imaging Results for orders placed or performed during the hospital encounter of 04/30/19 (  from the past 48 hour(s))  CBC with Differential     Status: None   Collection Time: 04/30/19 12:50 PM  Result Value Ref Range   WBC 6.0 4.0 - 10.5 K/uL   RBC 4.55 3.87 - 5.11 MIL/uL   Hemoglobin 13.8 12.0 - 15.0 g/dL   HCT 40.2 36.0 - 46.0 %   MCV 88.4 80.0 - 100.0 fL   MCH 30.3 26.0 - 34.0 pg   MCHC 34.3 30.0 - 36.0 g/dL   RDW 11.9 11.5 - 15.5 %   Platelets 175 150 - 400 K/uL   nRBC 0.0 0.0 - 0.2 %   Neutrophils Relative % 74 %   Neutro Abs 4.4 1.7 - 7.7 K/uL   Lymphocytes Relative 19 %   Lymphs Abs 1.2 0.7 - 4.0 K/uL   Monocytes Relative 6 %   Monocytes Absolute 0.4 0.1 - 1.0  K/uL   Eosinophils Relative 1 %   Eosinophils Absolute 0.1 0.0 - 0.5 K/uL   Basophils Relative 0 %   Basophils Absolute 0.0 0.0 - 0.1 K/uL   Immature Granulocytes 0 %   Abs Immature Granulocytes 0.02 0.00 - 0.07 K/uL    Comment: Performed at [facility], [address] [place], [place] [id number]  Basic metabolic panel     Status: Abnormal   Collection Time: 04/30/19 12:50 PM  Result Value Ref Range   Sodium 136 135 - 145 mmol/L   Potassium 3.0 (L) 3.5 - 5.1 mmol/L   Chloride 100 98 - 111 mmol/L   CO2 26 22 - 32 mmol/L   Glucose, Bld 186 (H) 70 - 99 mg/dL   BUN 11 8 - 23 mg/dL   Creatinine, Ser 0.59 0.44 - 1.00 mg/dL   Calcium 9.4 8.9 - 10.3 mg/dL   GFR calc non Af Amer >60 >60 mL/min   GFR calc Af Amer >60 >60 mL/min   Anion gap 10 5 - 15    Comment: Performed at [facility] [address] [place], [place] [address]  Hepatic function panel     Status: Abnormal   Collection Time: 04/30/19 12:50 PM  Result Value Ref Range   Total Protein 7.0 6.5 - 8.1 g/dL   Albumin 3.1 (L) 3.5 - 5.0 g/dL   AST 21 15 - 41 U/L   ALT 20 0 - 44 U/L   Alkaline Phosphatase 72 38 - 126 U/L   Total Bilirubin 0.6 0.3 - 1.2 mg/dL   Bilirubin, Direct 0.1 0.0 - 0.2 mg/dL   Indirect Bilirubin 0.5 0.3 - 0.9 mg/dL    Comment: Performed at [facility] [address] [place], [place] [address]  Urinalysis, Routine w reflex microscopic     Status: Abnormal   Collection Time: 04/30/19  1:42 PM  Result Value Ref Range   Color, Urine YELLOW YELLOW   APPearance CLOUDY (A) CLEAR   Specific Gravity, Urine 1.023 1.005 - 1.030   pH 5.0 5.0 - 8.0   Glucose, UA NEGATIVE NEGATIVE mg/dL   Hgb urine dipstick MODERATE (A) NEGATIVE   Bilirubin Urine NEGATIVE NEGATIVE   Ketones, ur NEGATIVE NEGATIVE mg/dL   Protein, ur 100 (A) NEGATIVE mg/dL   Nitrite POSITIVE (A) NEGATIVE   Leukocytes,Ua LARGE (A) NEGATIVE   RBC / HPF 21-50 0 - 5 RBC/hpf   WBC, UA >50 (H) 0 - 5 WBC/hpf   Bacteria, UA MANY  (A) NONE SEEN   Squamous Epithelial / LPF 11-20 0 - 5  WBC Clumps PRESENT    Mucus PRESENT     Comment: Performed at [facility], [place] [address], [place], [place] [address]  Respiratory Panel by RT PCR (Flu A&B, Covid) - Nasopharyngeal Swab     Status: Abnormal   Collection Time: 04/30/19  2:37 PM   Specimen: Nasopharyngeal Swab  Result Value Ref Range   SARS Coronavirus 2 by RT PCR POSITIVE (A) NEGATIVE    Comment: RESULT CALLED TO, READ BACK BY AND VERIFIED WITH: rn j blue [id number] [id number] (NOTE) SARS-CoV-2 target nucleic acids are DETECTED. SARS-CoV-2 RNA is generally detectable in upper respiratory specimens  during the acute phase of infection. Positive results are indicative of the presence of the identified virus, but do not rule out bacterial infection or co-infection with other pathogens not detected by the test. Clinical correlation with patient history and other diagnostic information is necessary to determine patient infection status. The expected result is Negative. Fact Sheet for Patients:  PinkCheek.be Fact Sheet for Healthcare Providers: GravelBags.it This test is not yet approved or cleared by the [place] FDA and  has been authorized for detection and/or diagnosis of SARS-CoV-2 by FDA under an Emergency Use Authorization (EUA).  This EUA will remain in effect (meaning this test can be used) for the dura tion of  the COVID-19 declaration under Section 564(b)(1) of the Act, 21 U.S.C. section 360bbb-3(b)(1), unless the authorization is terminated or revoked sooner.    Influenza A by PCR NEGATIVE NEGATIVE   Influenza B by PCR NEGATIVE NEGATIVE    Comment: (NOTE) The Xpert Xpress SARS-CoV-2/FLU/RSV assay is intended as an aid in  the diagnosis of influenza from Nasopharyngeal swab specimens and  should not be used as a sole basis for treatment. Nasal washings and  aspirates are unacceptable for  Xpert Xpress SARS-CoV-2/FLU/RSV  testing. Fact Sheet for Patients: PinkCheek.be Fact Sheet for Healthcare Providers: GravelBags.it This test is not yet approved or cleared by the [place] FDA and  has been authorized for detection and/or diagnosis of SARS-CoV-2 by  FDA under an Emergency Use Authorization (EUA). This EUA will remain  in effect (meaning this test can be used) for the duration of the  Covid-19 declaration under Section 564(b)(1) of the Act, 21  U.S.C. section 360bbb-3(b)(1), unless the authorization is  terminated or revoked. Performed at [facility], [place] [address], [place], [place] [address]   Lactic acid, plasma     Status: None   Collection Time: 04/30/19  3:05 PM  Result Value Ref Range   Lactic Acid, Venous 1.7 0.5 - 1.9 mmol/L    Comment: Performed at [facility] [address], [place], [place] [address]   CT Head Wo Contrast  Result Date: 04/30/2019 CLINICAL DATA:  Altered mental status. Confusion. EXAM: CT HEAD WITHOUT CONTRAST TECHNIQUE: Contiguous axial images were obtained from the base of the skull through the vertex without intravenous contrast. COMPARISON:  07/06/2016 FINDINGS: Brain: No evidence of acute infarction, hemorrhage, hydrocephalus, extra-axial collection or mass lesion/mass effect. Increased ventricle volumes noted bilaterally. There is mild diffuse low-attenuation within the subcortical and periventricular white matter compatible with chronic microvascular disease. Vascular: No hyperdense vessel or unexpected calcification. Skull: Normal. Negative for fracture or focal lesion. Sinuses/Orbits: No acute finding. Other: None. IMPRESSION: 1. No acute intracranial abnormalities. 2. Chronic small vessel ischemic change and brain atrophy. Electronically Signed   By: Kerby Moors M.D.   On: 04/30/2019 14:43   DG Chest Portable 1  View  Result Date: 04/30/2019 CLINICAL DATA:   Altered mental status, possible COVID EXAM: PORTABLE CHEST 1 VIEW COMPARISON:  04/13/2019 FINDINGS: The heart size and mediastinal contours are within normal limits. Both lungs are clear. The visualized skeletal structures are unremarkable. IMPRESSION: No acute abnormality of the lungs in AP portable projection. Electronically Signed   By: Eddie Candle M.D.   On: 04/30/2019 13:15    Pending Labs Unresulted Labs (From admission, onward)    Start     Ordered   05/01/19 [id number]  Basic metabolic panel  Tomorrow morning,   R     04/30/19 1707   05/01/19 0500  Magnesium  Tomorrow morning,   R     04/30/19 1707   05/01/19 0500  Phosphorus  Tomorrow morning,   R     04/30/19 1707   04/30/19 1521  Hemoglobin A1c  Once,   STAT    Comments: To assess prior glycemic control    04/30/19 1524   04/30/19 1447  Urine culture  ONCE - STAT,   STAT     04/30/19 1446   04/30/19 1438  Lactic acid, plasma  Now then every 2 hours,   STAT     04/30/19 1437   04/30/19 1438  Blood culture (routine x 2)  BLOOD CULTURE X 2,   STAT     04/30/19 1437          Vitals/Pain Today's Vitals   04/30/19 1400 04/30/19 1415 04/30/19 1437 04/30/19 1509  BP: (!) 170/95 (!) 160/94    Pulse: 66 67    Resp: 13 17    Temp:      TempSrc:      SpO2: 100% 100%    Weight:      Height:      PainSc:   0-No pain 0-No pain    Isolation Precautions Airborne and Contact precautions  Medications Medications  amLODipine (NORVASC) tablet 5 mg (0 mg Oral Hold 04/30/19 1837)  furosemide (LASIX) tablet 20 mg (has no administration in time range)  losartan (COZAAR) tablet 100 mg (0 mg Oral Hold 04/30/19 1837)  pravastatin (PRAVACHOL) tablet 40 mg (0 mg Oral Hold 04/30/19 1838)  donepezil (ARICEPT) tablet 5 mg (has no administration in time range)  Suvorexant TABS 5 mg (has no administration in time range)  cefTRIAXone (ROCEPHIN) 1 g in sodium chloride 0.9 % 100 mL IVPB (has no administration in time range)  latanoprost (XALATAN)  0.005 % ophthalmic solution 1 drop (has no administration in time range)  levothyroxine (SYNTHROID) tablet 75 mcg (has no administration in time range)  metFORMIN (GLUCOPHAGE-XR) 24 hr tablet 500 mg (0 mg Oral Hold 04/30/19 1838)  heparin injection 5,000 Units (0 Units Subcutaneous Hold 04/30/19 1837)  insulin aspart (novoLOG) injection 0-9 Units (has no administration in time range)  hydrALAZINE (APRESOLINE) tablet 10 mg (has no administration in time range)  OLANZapine (ZYPREXA) injection 2.5 mg (0 mg Intramuscular Hold 04/30/19 1838)  haloperidol lactate (HALDOL) injection 1 mg (has no administration in time range)  citric acid-potassium citrate (POLYCITRA) 10 mEq/5 ml solution 20 mEq (0 mEq Oral Hold 04/30/19 1837)  cefTRIAXone (ROCEPHIN) 1 g in sodium chloride 0.9 % 100 mL IVPB (0 g Intravenous Stopped 04/30/19 1555)    Mobility walks with person assist Moderate fall risk   Focused Assessments covid +, UTI   R Recommendations: See Admitting Provider Note  Report given to:   Additional Notes:

## 2019-05-01 ENCOUNTER — Encounter (HOSPITAL_COMMUNITY): Payer: Self-pay | Admitting: Internal Medicine

## 2019-05-01 ENCOUNTER — Inpatient Hospital Stay (HOSPITAL_COMMUNITY): Payer: Medicare Other

## 2019-05-01 DIAGNOSIS — R4182 Altered mental status, unspecified: Secondary | ICD-10-CM | POA: Insufficient documentation

## 2019-05-01 LAB — FERRITIN
Ferritin: 88 ng/mL (ref 11–307)
Ferritin: 80 ng/mL (ref 11–307)

## 2019-05-01 LAB — COMPREHENSIVE METABOLIC PANEL
ALT: 20 U/L (ref 0–44)
AST: 32 U/L (ref 15–41)
Albumin: 2.9 g/dL — ABNORMAL LOW (ref 3.5–5.0)
Alkaline Phosphatase: 67 U/L (ref 38–126)
Anion gap: 13 (ref 5–15)
BUN: 7 mg/dL — ABNORMAL LOW (ref 8–23)
CO2: 26 mmol/L (ref 22–32)
Calcium: 9.1 mg/dL (ref 8.9–10.3)
Chloride: 100 mmol/L (ref 98–111)
Creatinine, Ser: 0.53 mg/dL (ref 0.44–1.00)
GFR calc Af Amer: >60 mL/min (ref >60)
GFR calc non Af Amer: >60 mL/min (ref >60)
Glucose, Bld: 131 mg/dL — ABNORMAL HIGH (ref 70–99)
Potassium: 2.9 mmol/L — ABNORMAL LOW (ref 3.5–5.1)
Sodium: 139 mmol/L (ref 135–145)
Total Bilirubin: 0.6 mg/dL (ref 0.3–1.2)
Total Protein: 6.7 g/dL (ref 6.5–8.1)

## 2019-05-01 LAB — FIBRINOGEN: Fibrinogen: 622 mg/dL — ABNORMAL HIGH (ref 210–475)

## 2019-05-01 LAB — C-REACTIVE PROTEIN
CRP: 7 mg/dL — ABNORMAL HIGH (ref <1.0)
CRP: 6.7 mg/dL — ABNORMAL HIGH (ref <1.0)

## 2019-05-01 LAB — CBC WITH DIFFERENTIAL/PLATELET
Abs Immature Granulocytes: 0.02 10*3/uL (ref 0.00–0.07)
Basophils Absolute: 0 10*3/uL (ref 0.0–0.1)
Basophils Relative: 0 %
Eosinophils Absolute: 0 10*3/uL (ref 0.0–0.5)
Eosinophils Relative: 0 %
HCT: 37.8 % (ref 36.0–46.0)
Hemoglobin: 13 g/dL (ref 12.0–15.0)
Immature Granulocytes: 0 %
Lymphocytes Relative: 24 %
Lymphs Abs: 1.2 10*3/uL (ref 0.7–4.0)
MCH: 29.7 pg (ref 26.0–34.0)
MCHC: 34.4 g/dL (ref 30.0–36.0)
MCV: 86.5 fL (ref 80.0–100.0)
Monocytes Absolute: 0.3 10*3/uL (ref 0.1–1.0)
Monocytes Relative: 6 %
Neutro Abs: 3.5 10*3/uL (ref 1.7–7.7)
Neutrophils Relative %: 70 %
Platelets: 195 10*3/uL (ref 150–400)
RBC: 4.37 MIL/uL (ref 3.87–5.11)
RDW: 11.9 % (ref 11.5–15.5)
WBC: 5 10*3/uL (ref 4.0–10.5)
nRBC: 0 % (ref 0.0–0.2)

## 2019-05-01 LAB — MAGNESIUM: Magnesium: 1.7 mg/dL (ref 1.7–2.4)

## 2019-05-01 LAB — D-DIMER, QUANTITATIVE
D-Dimer, Quant: 7.1 ug/mL-FEU — ABNORMAL HIGH (ref 0.00–0.50)
D-Dimer, Quant: 1.88 ug/mL-FEU — ABNORMAL HIGH (ref 0.00–0.50)

## 2019-05-01 LAB — CBG MONITORING, ED
Glucose-Capillary: 163 mg/dL — ABNORMAL HIGH (ref 70–99)
Glucose-Capillary: 126 mg/dL — ABNORMAL HIGH (ref 70–99)
Glucose-Capillary: 152 mg/dL — ABNORMAL HIGH (ref 70–99)
Glucose-Capillary: 116 mg/dL — ABNORMAL HIGH (ref 70–99)

## 2019-05-01 LAB — LACTATE DEHYDROGENASE: LDH: 254 U/L — ABNORMAL HIGH (ref 98–192)

## 2019-05-01 LAB — PHOSPHORUS: Phosphorus: 2.6 mg/dL (ref 2.5–4.6)

## 2019-05-01 MED ORDER — MAGNESIUM SULFATE 4 GM/100ML IV SOLN
4.0000 g | Freq: Once | INTRAVENOUS | Status: AC
  Administered 2019-05-01: 4 g via INTRAVENOUS
  Filled 2019-05-01: qty 100

## 2019-05-01 MED ORDER — POTASSIUM CHLORIDE CRYS ER 20 MEQ PO TBCR
40.0000 meq | EXTENDED_RELEASE_TABLET | Freq: Once | ORAL | Status: AC
  Administered 2019-05-01: 40 meq via ORAL
  Filled 2019-05-01: qty 2

## 2019-05-01 NOTE — Progress Notes (Signed)
PROGRESS NOTE    Joy Ward  [id number] DOB: 1941-03-30 DOA: 04/30/2019 PCP: Sandrea Hughs, NP    Brief Narrative: 79 y.o. female with medical history significant of advanced dementia, IDDM, hypertension, hypothyroidism, will stand by her caregiver to hospital for increasing agitation and confusion.  Patient's caregiver is her granddaughter.  I tried to reach patient granddaughter 2 times, she has not replied so far.  Rest of the history were largely provided by ED physician and ED staff.  Reportedly was sent by her caregiver for evaluation of worsening of baseline mentation with increasing confusion and agitation and occasional visual hallucination. ED Course: UTI was found and pt was start on ABX.  According to ED physician who discussed with patient granddaughter, the caregiver obviously rash that she can no longer take of patient given her worsening mentation, and wished for evaluation for long-term placement Assessment & Plan:   Active Problems:   UTI (urinary tract infection)  #1 E coli UTI -ON ROCEPHIN continue.  Patient admitted with worsening change in mental status in the setting of dementia secondary to UTI.  According to her granddaughter patient started hallucinating and increased confusion and agitation.  She was found to have a UTI.  #2  Dementia continue Aricept   #3 type 2 diabetes blood glucose 148, 163, 163, 126, 152 continue Glucophage  #4 hyperlipidemia on statin  #5 essential hypertension blood pressure 128/76 continue Norvasc and Cozaar  #6 Covid positive granddaughter reported that she was Covid positive before Christmas when she took her to [place] for Christmas.  Everyone in the family had Covid.  #7 acute metabolic/toxic encephalopathy secondary to UTI continue Rocephin.  #8 glaucoma continue Lumigan eyedrops  #9 hypothyroidism continue Synthroid  #10 hypokalemia and hypomagnesemia replete and recheck.  She is on Lasix daily without potassium  replacement however she appears more on the dry side will hold Lasix for now.   Estimated body mass index is 22.47 kg/m as calculated from the following:   Height as of this encounter: 5\' 5"  (1.651 m).   Weight as of this encounter: 61.2 kg.  DVT prophylaxis: Lovenox Code Status: Full code Family Communication: dw granddaughter tamara family would like for [place] to communicate with the granddaughter and stop calling her daughters. Disposition Plan: Pending PT evaluation and clinical improvement Consultants:  none  Procedures: none Antimicrobials:rocephin Subjective: Resting in bed.  In no acute distress upset when I asked a few questions. Objective: Vitals:   05/01/19 0930 05/01/19 1030 05/01/19 1100 05/01/19 1130  BP: (!) 169/98 (!) 169/92 (!) 170/92 128/76  Pulse:    88  Resp: (!) 25 (!) 28 12 20   Temp:      TempSrc:      SpO2:    100%  Weight:      Height:       No intake or output data in the 24 hours ending 05/01/19 1503 Filed Weights   04/30/19 1235  Weight: 61.2 kg    Examination:  General exam: Appears calm and comfortable  Respiratory system: Clear to auscultation. Respiratory effort normal. Cardiovascular system: S1 & S2 heard, RRR. No JVD, murmurs, rubs, gallops or clicks. No pedal edema. Gastrointestinal system: Abdomen is nondistended, soft and nontender. No organomegaly or masses felt. Normal bowel sounds heard. Central nervous system: Alert and oriented. No focal neurological deficits. Extremities: Symmetric 5 x 5 power. Skin: No rashes, lesions or ulcers Psychiatry: Judgement and insight appear normal. Mood & affect appropriate.     Data  Reviewed: I have personally reviewed following labs and imaging studies  CBC: Recent Labs  Lab 04/30/19 1250 05/01/19 0500  WBC 6.0 5.0  NEUTROABS 4.4 3.5  HGB 13.8 13.0  HCT 40.2 37.8  MCV 88.4 86.5  PLT 175 0000000   Basic Metabolic Panel: Recent Labs  Lab 04/30/19 1250 05/01/19 0500  NA 136 139  K 3.0*  2.9*  CL 100 100  CO2 26 26  GLUCOSE 186* 131*  BUN 11 7*  CREATININE 0.59 0.53  CALCIUM 9.4 9.1  MG  --  1.7  PHOS  --  2.6   GFR: Estimated Creatinine Clearance: 53 mL/min (by C-G formula based on SCr of 0.53 mg/dL). Liver Function Tests: Recent Labs  Lab 04/30/19 1250 05/01/19 0500  AST 21 32  ALT 20 20  ALKPHOS 72 67  BILITOT 0.6 0.6  PROT 7.0 6.7  ALBUMIN 3.1* 2.9*   No results for input(s): LIPASE, AMYLASE in the last 168 hours. No results for input(s): AMMONIA in the last 168 hours. Coagulation Profile: No results for input(s): INR, PROTIME in the last 168 hours. Cardiac Enzymes: No results for input(s): CKTOTAL, CKMB, CKMBINDEX, TROPONINI in the last 168 hours. BNP (last 3 results) No results for input(s): PROBNP in the last 8760 hours. HbA1C: Recent Labs    04/30/19 1250  HGBA1C 6.7*   CBG: Recent Labs  Lab 04/30/19 2139 05/01/19 0111 05/01/19 0813 05/01/19 1234  GLUCAP 163* 163* 126* 152*   Lipid Profile: No results for input(s): CHOL, HDL, LDLCALC, TRIG, CHOLHDL, LDLDIRECT in the last 72 hours. Thyroid Function Tests: No results for input(s): TSH, T4TOTAL, FREET4, T3FREE, THYROIDAB in the last 72 hours. Anemia Panel: Recent Labs    05/01/19 0245 05/01/19 0500  FERRITIN 88 80   Sepsis Labs: Recent Labs  Lab 04/30/19 1505  LATICACIDVEN 1.7    Recent Results (from the past 240 hour(s))  Respiratory Panel by RT PCR (Flu A&B, Covid) - Nasopharyngeal Swab     Status: Abnormal   Collection Time: 04/30/19  2:37 PM   Specimen: Nasopharyngeal Swab  Result Value Ref Range Status   SARS Coronavirus 2 by RT PCR POSITIVE (A) NEGATIVE Final    Comment: RESULT CALLED TO, READ BACK BY AND VERIFIED WITH: rn j blue [id number] (NOTE) SARS-CoV-2 target nucleic acids are DETECTED. SARS-CoV-2 RNA is generally detectable in upper respiratory specimens  during the acute phase of infection. Positive results are indicative of the presence of the  identified virus, but do not rule out bacterial infection or co-infection with other pathogens not detected by the test. Clinical correlation with patient history and other diagnostic information is necessary to determine patient infection status. The expected result is Negative. Fact Sheet for Patients:  PinkCheek.be Fact Sheet for Healthcare Providers: GravelBags.it This test is not yet approved or cleared by the [place] FDA and  has been authorized for detection and/or diagnosis of SARS-CoV-2 by FDA under an Emergency Use Authorization (EUA).  This EUA will remain in effect (meaning this test can be used) for the dura tion of  the COVID-19 declaration under Section 564(b)(1) of the Act, 21 U.S.C. section 360bbb-3(b)(1), unless the authorization is terminated or revoked sooner.    Influenza A by PCR NEGATIVE NEGATIVE Final   Influenza B by PCR NEGATIVE NEGATIVE Final    Comment: (NOTE) The Xpert Xpress SARS-CoV-2/FLU/RSV assay is intended as an aid in  the diagnosis of influenza from Nasopharyngeal swab specimens and  should not be used  as a sole basis for treatment. Nasal washings and  aspirates are unacceptable for Xpert Xpress SARS-CoV-2/FLU/RSV  testing. Fact Sheet for Patients: PinkCheek.be Fact Sheet for Healthcare Providers: GravelBags.it This test is not yet approved or cleared by the [place] FDA and  has been authorized for detection and/or diagnosis of SARS-CoV-2 by  FDA under an Emergency Use Authorization (EUA). This EUA will remain  in effect (meaning this test can be used) for the duration of the  Covid-19 declaration under Section 564(b)(1) of the Act, 21  U.S.C. section 360bbb-3(b)(1), unless the authorization is  terminated or revoked. Performed at [facility], [place] [address], [place], [place] [address]   Urine culture      Status: Abnormal (Preliminary result)   Collection Time: 04/30/19  2:47 PM   Specimen: Urine, Random  Result Value Ref Range Status   Specimen Description URINE, RANDOM  Final   Special Requests NONE  Final   Culture (A)  Final    >=100,000 COLONIES/mL ESCHERICHIA COLI CULTURE REINCUBATED FOR BETTER GROWTH Performed at [facility], [place] [address] [place], [place] [address]    Report Status PENDING  Incomplete  Blood culture (routine x 2)     Status: None (Preliminary result)   Collection Time: 04/30/19  3:00 PM   Specimen: Right Antecubital; Blood  Result Value Ref Range Status   Specimen Description RIGHT ANTECUBITAL  Final   Special Requests   Final    BOTTLES DRAWN AEROBIC AND ANAEROBIC Blood Culture results may not be optimal due to an inadequate volume of blood received in culture bottles   Culture   Final    NO GROWTH < 24 HOURS Performed at [facility], [place] [address] [place], [place] [address]    Report Status PENDING  Incomplete  Blood culture (routine x 2)     Status: None (Preliminary result)   Collection Time: 04/30/19  3:05 PM   Specimen: Left Antecubital; Blood  Result Value Ref Range Status   Specimen Description LEFT ANTECUBITAL  Final   Special Requests   Final    BOTTLES DRAWN AEROBIC AND ANAEROBIC Blood Culture results may not be optimal due to an inadequate volume of blood received in culture bottles   Culture   Final    NO GROWTH < 24 HOURS Performed at [facility], [place] [address], [place],  [address]    Report Status PENDING  Incomplete         Radiology Studies: CT HEAD WO CONTRAST  Result Date: 05/01/2019 CLINICAL DATA:  Head trauma dementia EXAM: CT HEAD WITHOUT CONTRAST TECHNIQUE: Contiguous axial images were obtained from the base of the skull through the vertex without intravenous contrast. COMPARISON:  04/30/2019, 04/13/2019, 07/06/2016 FINDINGS: Brain: No acute territorial infarction, hemorrhage, or intracranial  mass is visualized. Atrophy. Extensive hypodensity within the subcortical, periventricular and deep white matter consistent with chronic small vessel ischemic change. Chronic appearing lacunar infarcts in the basal ganglia. Stable ventricle size. Vascular: No hyperdense vessels.  Carotid vascular calcification Skull: Normal. Negative for fracture or focal lesion. Sinuses/Orbits: Mucous retention cysts and mucosal thickening in the left maxillary sinus. Other: None IMPRESSION: 1. No CT evidence for acute intracranial abnormality. 2. Chronic small vessel ischemic change of the white matter. Atrophy. Electronically Signed   By: Donavan Foil M.D.   On: 05/01/2019 02:57   CT Head Wo Contrast  Result Date: 04/30/2019 CLINICAL DATA:  Altered mental status. Confusion. EXAM: CT HEAD  WITHOUT CONTRAST TECHNIQUE: Contiguous axial images were obtained from the base of the skull through the vertex without intravenous contrast. COMPARISON:  07/06/2016 FINDINGS: Brain: No evidence of acute infarction, hemorrhage, hydrocephalus, extra-axial collection or mass lesion/mass effect. Increased ventricle volumes noted bilaterally. There is mild diffuse low-attenuation within the subcortical and periventricular white matter compatible with chronic microvascular disease. Vascular: No hyperdense vessel or unexpected calcification. Skull: Normal. Negative for fracture or focal lesion. Sinuses/Orbits: No acute finding. Other: None. IMPRESSION: 1. No acute intracranial abnormalities. 2. Chronic small vessel ischemic change and brain atrophy. Electronically Signed   By: Kerby Moors M.D.   On: 04/30/2019 14:43   DG Chest Portable 1 View  Result Date: 04/30/2019 CLINICAL DATA:  Altered mental status, possible COVID EXAM: PORTABLE CHEST 1 VIEW COMPARISON:  04/13/2019 FINDINGS: The heart size and mediastinal contours are within normal limits. Both lungs are clear. The visualized skeletal structures are unremarkable. IMPRESSION: No acute  abnormality of the lungs in AP portable projection. Electronically Signed   By: Eddie Candle M.D.   On: 04/30/2019 13:15        Scheduled Meds: . amLODipine  5 mg Oral Daily  . donepezil  5 mg Oral QHS  . furosemide  20 mg Oral Daily  . heparin  5,000 Units Subcutaneous Q12H  . insulin aspart  0-9 Units Subcutaneous TID WC  . latanoprost  1 drop Both Eyes QHS  . levothyroxine  75 mcg Oral Q0600  . losartan  100 mg Oral Daily  . metFORMIN  500 mg Oral BID WC  . potassium chloride  20 mEq Oral TID  . pravastatin  40 mg Oral q1800  . QUEtiapine  25 mg Oral BID   Continuous Infusions: . cefTRIAXone (ROCEPHIN)  IV       LOS: 1 day     Georgette Shell, MD [facility]  If 7PM-7AM, please contact night-coverage [email or web address] Password [facility] 05/01/2019, 3:03 PM

## 2019-05-01 NOTE — ED Notes (Signed)
Dinner tray ordered for pt  Air cabin crew remains at door of pt's room  Pt resting quietly at this time

## 2019-05-01 NOTE — ED Notes (Signed)
Pharmacy called ref.  Metformin st's they will send

## 2019-05-01 NOTE — ED Notes (Signed)
Called daughter [name]  And talked with her, states patient has been confused.

## 2019-05-01 NOTE — ED Notes (Signed)
Breakfast ordered 

## 2019-05-01 NOTE — ED Notes (Signed)
Stuck patient x3 notified  Nurse  Claiborne Billings no blood return

## 2019-05-01 NOTE — ED Notes (Signed)
All soft restraints removed sitter watching patient . Patient currently sleeping at present. Skin intact good rom positive pulses.

## 2019-05-01 NOTE — ED Notes (Signed)
Soft restraints remain on, good ROM, skin intact positive pulses.

## 2019-05-01 NOTE — ED Notes (Signed)
Bed changed and patient repositioned on stretcher, patient remains in soft restraints, good ROM skin intact, positive pulses.

## 2019-05-01 NOTE — ED Notes (Signed)
Pt had second fall. Pt found down near sink, unwitnessed fall. Possible that pt hit head on sink. No LOC, obvious trauma, hematoma. Pt downtime approximately 5-10 minutes as NT recently drew blood on pt. This RN was in with another pt for a blood transfusion. All VSS, no change in neuro status. MD notified, CT scan ordered. Pt placed in soft wrist restraints as this is the second fall in 2 hours and still no sitter. Charge RN made aware of current situation.

## 2019-05-01 NOTE — ED Notes (Signed)
  Safety sitter at door

## 2019-05-01 NOTE — ED Notes (Signed)
Daughter called and update given , phone given to patient to talk to her daughter.

## 2019-05-01 NOTE — ED Notes (Signed)
Soft restraints on good rom, skin intact, positive pulses.

## 2019-05-02 ENCOUNTER — Other Ambulatory Visit: Payer: Self-pay

## 2019-05-02 LAB — CBC WITH DIFFERENTIAL/PLATELET
Abs Immature Granulocytes: 0.02 10*3/uL (ref 0.00–0.07)
Basophils Absolute: 0 10*3/uL (ref 0.0–0.1)
Basophils Relative: 1 %
Eosinophils Absolute: 0.2 10*3/uL (ref 0.0–0.5)
Eosinophils Relative: 2 %
HCT: 41.2 % (ref 36.0–46.0)
Hemoglobin: 13.9 g/dL (ref 12.0–15.0)
Immature Granulocytes: 0 %
Lymphocytes Relative: 31 %
Lymphs Abs: 2 10*3/uL (ref 0.7–4.0)
MCH: 29.8 pg (ref 26.0–34.0)
MCHC: 33.7 g/dL (ref 30.0–36.0)
MCV: 88.2 fL (ref 80.0–100.0)
Monocytes Absolute: 0.5 10*3/uL (ref 0.1–1.0)
Monocytes Relative: 7 %
Neutro Abs: 3.8 10*3/uL (ref 1.7–7.7)
Neutrophils Relative %: 59 %
Platelets: 222 10*3/uL (ref 150–400)
RBC: 4.67 MIL/uL (ref 3.87–5.11)
RDW: 12.3 % (ref 11.5–15.5)
WBC: 6.5 10*3/uL (ref 4.0–10.5)
nRBC: 0 % (ref 0.0–0.2)

## 2019-05-02 LAB — COMPREHENSIVE METABOLIC PANEL
ALT: 20 U/L (ref 0–44)
AST: 27 U/L (ref 15–41)
Albumin: 3 g/dL — ABNORMAL LOW (ref 3.5–5.0)
Alkaline Phosphatase: 71 U/L (ref 38–126)
Anion gap: 13 (ref 5–15)
BUN: 7 mg/dL — ABNORMAL LOW (ref 8–23)
CO2: 24 mmol/L (ref 22–32)
Calcium: 9.3 mg/dL (ref 8.9–10.3)
Chloride: 102 mmol/L (ref 98–111)
Creatinine, Ser: 0.54 mg/dL (ref 0.44–1.00)
GFR calc Af Amer: >60 mL/min (ref >60)
GFR calc non Af Amer: >60 mL/min (ref >60)
Glucose, Bld: 144 mg/dL — ABNORMAL HIGH (ref 70–99)
Potassium: 4.4 mmol/L (ref 3.5–5.1)
Sodium: 139 mmol/L (ref 135–145)
Total Bilirubin: 0.6 mg/dL (ref 0.3–1.2)
Total Protein: 7.1 g/dL (ref 6.5–8.1)

## 2019-05-02 LAB — D-DIMER, QUANTITATIVE: D-Dimer, Quant: 1.65 ug/mL-FEU — ABNORMAL HIGH (ref 0.00–0.50)

## 2019-05-02 LAB — URINE CULTURE: Culture: >=100000 — AB

## 2019-05-02 LAB — GLUCOSE, CAPILLARY
Glucose-Capillary: 115 mg/dL — ABNORMAL HIGH (ref 70–99)
Glucose-Capillary: 139 mg/dL — ABNORMAL HIGH (ref 70–99)
Glucose-Capillary: 144 mg/dL — ABNORMAL HIGH (ref 70–99)
Glucose-Capillary: 161 mg/dL — ABNORMAL HIGH (ref 70–99)
Glucose-Capillary: 163 mg/dL — ABNORMAL HIGH (ref 70–99)

## 2019-05-02 LAB — MAGNESIUM: Magnesium: 2.3 mg/dL (ref 1.7–2.4)

## 2019-05-02 LAB — FERRITIN: Ferritin: 89 ng/mL (ref 11–307)

## 2019-05-02 LAB — C-REACTIVE PROTEIN: CRP: 3.5 mg/dL — ABNORMAL HIGH (ref <1.0)

## 2019-05-02 LAB — PHOSPHORUS: Phosphorus: 2.5 mg/dL (ref 2.5–4.6)

## 2019-05-02 MED ORDER — HYDROCODONE-ACETAMINOPHEN 10-325 MG PO TABS
1.0000 | ORAL_TABLET | Freq: Four times a day (QID) | ORAL | Status: DC | PRN
  Administered 2019-05-02 – 2019-05-03 (×3): 1 via ORAL
  Filled 2019-05-02 (×4): qty 1

## 2019-05-02 MED ORDER — HYDROXYZINE HCL 10 MG PO TABS
10.0000 mg | ORAL_TABLET | Freq: Three times a day (TID) | ORAL | Status: AC | PRN
  Administered 2019-05-02: 10 mg via ORAL
  Filled 2019-05-02: qty 1

## 2019-05-02 MED ORDER — AMITRIPTYLINE HCL 25 MG PO TABS: 25.0000 mg | ORAL_TABLET | Freq: Every day | ORAL | Status: DC

## 2019-05-02 MED ORDER — CEPHALEXIN 500 MG PO CAPS
500.0000 mg | ORAL_CAPSULE | Freq: Two times a day (BID) | ORAL | Status: AC
  Administered 2019-05-02 – 2019-05-06 (×10): 500 mg via ORAL
  Filled 2019-05-02 (×12): qty 1

## 2019-05-02 NOTE — Progress Notes (Signed)
PROGRESS NOTE    Joy Ward  [id number] DOB: [birth year] DOA: 04/30/2019 PCP: Sandrea Hughs, NP    Brief Narrative:79 y.o.femalewith medical history significant ofadvanced dementia,IDDM,hypertension,hypothyroidism,will stand by her caregiver to hospital for increasing agitation and confusion.Patient's caregiver is her granddaughter. I triedto reach patient granddaughter 2 times,she has not replied so far.Rest of the history were largely provided by ED physician and ED staff.Reportedly was sent by her caregiver for evaluation of worsening of baseline mentation with increasing confusion and agitation and occasional visual hallucination. ED Course:UTI was found and pt was start on ABX.According to ED physician who discussed with patient granddaughter,the caregiver obviously rash that she can no longer take of patient given her worsening mentation,and wished for evaluation for long-term placement  Assessment & Plan:   Active Problems:   UTI (urinary tract infection)  #1 E coli UTI -ON ROCEPHIN continue.  Patient admitted with worsening change in mental status in the setting of dementia secondary to UTI.  According to her granddaughter patient started hallucinating and increased confusion and agitation.  She was found to have a UTI. Patient had 2 falls while she was in the ER a CT obtained shows no acute findings.  PT eval  #2  Dementia continue Aricept   #3 type 2 diabetes  CBG (last 3)  Recent Labs    05/01/19 1834 05/01/19 2359 05/02/19 0742  GLUCAP 116* 139* 163*   continue Glucophage  #4 hyperlipidemia on statin  #5 essential hypertension blood pressure 155/84 continue Norvasc and Cozaar.   #6 Covid positive granddaughter reported that she was Covid positive before Christmas when she took her to [place] for Christmas.  Everyone in the family had Covid.  #7 acute metabolic/toxic encephalopathy secondary to UTI continue Rocephin.  #8  glaucoma continue Lumigan eyedrops  #9 hypothyroidism continue Synthroid  #10 hypokalemia and hypomagnesemia -k 4.4 and mag 2.3. She was  on Lasix daily without potassium replacement however she appears more on the dry side will hold Lasix for now.   Estimated body mass index is 22.47 kg/m as calculated from the following:   Height as of this encounter: 5\' 5"  (1.651 m).   Weight as of this encounter: 61.2 kg.  DVT prophylaxis: Lovenox Code Status: Full code Family Communication: dw granddaughter tamara family would like for [place] to communicate with the granddaughter and stop calling her daughters. Disposition Plan: Pending PT evaluation and clinical improvement Consultants:  none  Procedures: none Antimicrobials:rocephin  Subjective: Patient had 2 falls while she was in the ER she got up walked in the hallway and had an assisted fall to the floor the first time and the second time she was found near the sink and had an unwitnessed fall. She is very unsteady with her gait and she has dementia   Objective: Vitals:   05/01/19 2245 05/01/19 2352 05/02/19 0531 05/02/19 0800  BP: 139/83 (!) 147/91 110/64 (!) 155/84  Pulse: 71 79 82   Resp:  19 17   Temp:  98.1 F (36.7 C) 98.7 F (37.1 C)   TempSrc:  Oral Oral   SpO2: 95% 97% 99%   Weight:      Height:        Intake/Output Summary (Last 24 hours) at 05/02/2019 1103 Last data filed at 05/02/2019 1005 Gross per 24 hour  Intake 816.28 ml  Output --  Net 816.28 ml   Filed Weights   04/30/19 1235  Weight: 61.2 kg    Examination:  General exam: Appears  calm and comfortable  Respiratory system: Clear to auscultation. Respiratory effort normal. Cardiovascular system: S1 & S2 heard, RRR. No JVD, murmurs, rubs, gallops or clicks. No pedal edema. Gastrointestinal system: Abdomen is nondistended, soft and nontender. No organomegaly or masses felt. Normal bowel sounds heard. Central nervous system: Alert and oriented. No focal  neurological deficits. Extremities: Symmetric 5 x 5 power. Skin: No rashes, lesions or ulcers Psychiatry: Judgement and insight appear normal. Mood & affect appropriate.     Data Reviewed: I have personally reviewed following labs and imaging studies  CBC: Recent Labs  Lab 04/30/19 1250 05/01/19 0500 05/02/19 0439  WBC 6.0 5.0 6.5  NEUTROABS 4.4 3.5 3.8  HGB 13.8 13.0 13.9  HCT 40.2 37.8 41.2  MCV 88.4 86.5 88.2  PLT 175 195 [id number]   Basic Metabolic Panel: Recent Labs  Lab 04/30/19 1250 05/01/19 0500 05/02/19 0439  NA 136 139 139  K 3.0* 2.9* 4.4  CL 100 100 102  CO2 26 26 24   GLUCOSE 186* 131* 144*  BUN 11 7* 7*  CREATININE 0.59 0.53 0.54  CALCIUM 9.4 9.1 9.3  MG  --  1.7 2.3  PHOS  --  2.6 2.5   GFR: Estimated Creatinine Clearance: 53 mL/min (by C-G formula based on SCr of 0.54 mg/dL). Liver Function Tests: Recent Labs  Lab 04/30/19 1250 05/01/19 0500 05/02/19 0439  AST 21 32 27  ALT 20 20 20   ALKPHOS 72 67 71  BILITOT 0.6 0.6 0.6  PROT 7.0 6.7 7.1  ALBUMIN 3.1* 2.9* 3.0*   No results for input(s): LIPASE, AMYLASE in the last 168 hours. No results for input(s): AMMONIA in the last 168 hours. Coagulation Profile: No results for input(s): INR, PROTIME in the last 168 hours. Cardiac Enzymes: No results for input(s): CKTOTAL, CKMB, CKMBINDEX, TROPONINI in the last 168 hours. BNP (last 3 results) No results for input(s): PROBNP in the last 8760 hours. HbA1C: Recent Labs    04/30/19 1250  HGBA1C 6.7*   CBG: Recent Labs  Lab 05/01/19 0813 05/01/19 1234 05/01/19 1834 05/01/19 2359 05/02/19 0742  GLUCAP 126* 152* 116* 139* 163*   Lipid Profile: No results for input(s): CHOL, HDL, LDLCALC, TRIG, CHOLHDL, LDLDIRECT in the last 72 hours. Thyroid Function Tests: No results for input(s): TSH, T4TOTAL, FREET4, T3FREE, THYROIDAB in the last 72 hours. Anemia Panel: Recent Labs    05/01/19 0500 05/02/19 0439  FERRITIN 80 89   Sepsis Labs: Recent  Labs  Lab 04/30/19 1505  LATICACIDVEN 1.7    Recent Results (from the past 240 hour(s))  Respiratory Panel by RT PCR (Flu A&B, Covid) - Nasopharyngeal Swab     Status: Abnormal   Collection Time: 04/30/19  2:37 PM   Specimen: Nasopharyngeal Swab  Result Value Ref Range Status   SARS Coronavirus 2 by RT PCR POSITIVE (A) NEGATIVE Final    Comment: RESULT CALLED TO, READ BACK BY AND VERIFIED WITH: rn j blue [id number] cj (NOTE) SARS-CoV-2 target nucleic acids are DETECTED. SARS-CoV-2 RNA is generally detectable in upper respiratory specimens  during the acute phase of infection. Positive results are indicative of the presence of the identified virus, but do not rule out bacterial infection or co-infection with other pathogens not detected by the test. Clinical correlation with patient history and other diagnostic information is necessary to determine patient infection status. The expected result is Negative. Fact Sheet for Patients:  PinkCheek.be Fact Sheet for Healthcare Providers: GravelBags.it This test is not yet approved or cleared  by the [place] and  has been authorized for detection and/or diagnosis of SARS-CoV-2 by FDA under an Emergency Use Authorization (EUA).  This EUA will remain in effect (meaning this test can be used) for the dura tion of  the COVID-19 declaration under Section 564(b)(1) of the Act, 21 U.S.C. section 360bbb-3(b)(1), unless the authorization is terminated or revoked sooner.    Influenza A by PCR NEGATIVE NEGATIVE Final   Influenza B by PCR NEGATIVE NEGATIVE Final    Comment: (NOTE) The Xpert Xpress SARS-CoV-2/FLU/RSV assay is intended as an aid in  the diagnosis of influenza from Nasopharyngeal swab specimens and  should not be used as a sole basis for treatment. Nasal washings and  aspirates are unacceptable for Xpert Xpress SARS-CoV-2/FLU/RSV  testing. Fact Sheet for  Patients: PinkCheek.be Fact Sheet for Healthcare Providers: GravelBags.it This test is not yet approved or cleared by the [place] FDA and  has been authorized for detection and/or diagnosis of SARS-CoV-2 by  FDA under an Emergency Use Authorization (EUA). This EUA will remain  in effect (meaning this test can be used) for the duration of the  Covid-19 declaration under Section 564(b)(1) of the Act, 21  U.S.C. section 360bbb-3(b)(1), unless the authorization is  terminated or revoked. Performed at [facility], [place] [address], [place], [place] [address]   Urine culture     Status: Abnormal   Collection Time: 04/30/19  2:47 PM   Specimen: Urine, Random  Result Value Ref Range Status   Specimen Description URINE, RANDOM  Final   Special Requests NONE  Final   Culture (A)  Final    >=100,000 COLONIES/mL ESCHERICHIA COLI Two isolates with different morphologies were identified as the same organism.The most resistant organism was reported. Performed at [facility], [place] [address] [place], [place] [address]    Report Status 05/02/2019 FINAL  Final   Organism ID, Bacteria ESCHERICHIA COLI (A)  Final      Susceptibility   Escherichia coli - MIC*    AMPICILLIN <=2 SENSITIVE Sensitive     CEFAZOLIN <=4 SENSITIVE Sensitive     CEFTRIAXONE <=0.25 SENSITIVE Sensitive     CIPROFLOXACIN <=0.25 SENSITIVE Sensitive     GENTAMICIN <=1 SENSITIVE Sensitive     IMIPENEM <=0.25 SENSITIVE Sensitive     NITROFURANTOIN <=16 SENSITIVE Sensitive     TRIMETH/SULFA <=20 SENSITIVE Sensitive     AMPICILLIN/SULBACTAM <=2 SENSITIVE Sensitive     PIP/TAZO <=4 SENSITIVE Sensitive     * >=100,000 COLONIES/mL ESCHERICHIA COLI  Blood culture (routine x 2)     Status: None (Preliminary result)   Collection Time: 04/30/19  3:00 PM   Specimen: Right Antecubital; Blood  Result Value Ref Range Status   Specimen Description RIGHT ANTECUBITAL   Final   Special Requests   Final    BOTTLES DRAWN AEROBIC AND ANAEROBIC Blood Culture results may not be optimal due to an inadequate volume of blood received in culture bottles   Culture   Final    NO GROWTH < 24 HOURS Performed at [facility] [address] [place], [place] [address]    Report Status PENDING  Incomplete  Blood culture (routine x 2)     Status: None (Preliminary result)   Collection Time: 04/30/19  3:05 PM   Specimen: Left Antecubital; Blood  Result Value Ref Range Status   Specimen Description LEFT ANTECUBITAL  Final   Special Requests   Final    BOTTLES  DRAWN AEROBIC AND ANAEROBIC Blood Culture results may not be optimal due to an inadequate volume of blood received in culture bottles   Culture   Final    NO GROWTH < 24 HOURS Performed at [facility] [address], [place], [place] [address]    Report Status PENDING  Incomplete         Radiology Studies: CT HEAD WO CONTRAST  Result Date: 05/01/2019 CLINICAL DATA:  Head trauma dementia EXAM: CT HEAD WITHOUT CONTRAST TECHNIQUE: Contiguous axial images were obtained from the base of the skull through the vertex without intravenous contrast. COMPARISON:  04/30/2019, 04/13/2019, 07/06/2016 FINDINGS: Brain: No acute territorial infarction, hemorrhage, or intracranial mass is visualized. Atrophy. Extensive hypodensity within the subcortical, periventricular and deep white matter consistent with chronic small vessel ischemic change. Chronic appearing lacunar infarcts in the basal ganglia. Stable ventricle size. Vascular: No hyperdense vessels.  Carotid vascular calcification Skull: Normal. Negative for fracture or focal lesion. Sinuses/Orbits: Mucous retention cysts and mucosal thickening in the left maxillary sinus. Other: None IMPRESSION: 1. No CT evidence for acute intracranial abnormality. 2. Chronic small vessel ischemic change of the white matter. Atrophy. Electronically Signed   By: Donavan Foil M.D.    On: 05/01/2019 02:57   CT Head Wo Contrast  Result Date: 04/30/2019 CLINICAL DATA:  Altered mental status. Confusion. EXAM: CT HEAD WITHOUT CONTRAST TECHNIQUE: Contiguous axial images were obtained from the base of the skull through the vertex without intravenous contrast. COMPARISON:  07/06/2016 FINDINGS: Brain: No evidence of acute infarction, hemorrhage, hydrocephalus, extra-axial collection or mass lesion/mass effect. Increased ventricle volumes noted bilaterally. There is mild diffuse low-attenuation within the subcortical and periventricular white matter compatible with chronic microvascular disease. Vascular: No hyperdense vessel or unexpected calcification. Skull: Normal. Negative for fracture or focal lesion. Sinuses/Orbits: No acute finding. Other: None. IMPRESSION: 1. No acute intracranial abnormalities. 2. Chronic small vessel ischemic change and brain atrophy. Electronically Signed   By: Kerby Moors M.D.   On: 04/30/2019 14:43   DG Chest Portable 1 View  Result Date: 04/30/2019 CLINICAL DATA:  Altered mental status, possible COVID EXAM: PORTABLE CHEST 1 VIEW COMPARISON:  04/13/2019 FINDINGS: The heart size and mediastinal contours are within normal limits. Both lungs are clear. The visualized skeletal structures are unremarkable. IMPRESSION: No acute abnormality of the lungs in AP portable projection. Electronically Signed   By: Eddie Candle M.D.   On: 04/30/2019 13:15        Scheduled Meds: . amLODipine  5 mg Oral Daily  . donepezil  5 mg Oral QHS  . heparin  5,000 Units Subcutaneous Q12H  . insulin aspart  0-9 Units Subcutaneous TID WC  . latanoprost  1 drop Both Eyes QHS  . levothyroxine  75 mcg Oral Q0600  . losartan  100 mg Oral Daily  . metFORMIN  500 mg Oral BID WC  . pravastatin  40 mg Oral q1800  . QUEtiapine  25 mg Oral BID   Continuous Infusions: . cefTRIAXone (ROCEPHIN)  IV Stopped (05/01/19 1626)     LOS: 2 days     Georgette Shell, MD [facility]  If 7PM-7AM, please contact night-coverage [email or web address] Password [email or web address] 05/02/2019, 11:03 AM

## 2019-05-02 NOTE — Progress Notes (Signed)
Pharmacy note - antibiotic stewardship   Patient is on ceftriaxone daily for a UTI.  Urine culture showing Ecoli sensitive to all antibiotics tested.  I asked Dr. Rodena Piety if we could de-escalate to cephalexin today.  Orders received.  Will give cephalexin for 5 more days.  Heide Guile, PharmD, BCPS-AQ ID Clinical Pharmacist [phone]

## 2019-05-02 NOTE — Evaluation (Signed)
Physical Therapy Evaluation Patient Details Name: Joy Ward MRN: [id number] DOB: 09/22/1940 Today's Date: 05/02/2019   History of Present Illness  79 year old female with history of high cholesterol, diabetes, dementia who presents to the ED 04/30/19 with increasing confusion, intermittent hallucinations. Pt's caregiver is her granddaughter, Jonelle Sidle who states she can no longer take care of patient given her worsening mentation and hallucinations.  UTI was found and pt was start on ABX.   Clinical Impression  On entry bed alarm sounding and pt attempting to get out of bed. When asked what she was doing pt states "I don't know." Pt poor historian, not able to recall her granddaughter's name, requires max cuing to state she is at [facility] with [place]. Unable to reach granddaughter for corroboration but pt states she lives with her granddaughter in a second story apartment with a flight of steps to enter. Pt reports she did not use an AD and was able to take care of her self. Pt is limited in her safe mobility by decreased cognition in particular safety awareness and awareness of deficits. Pt is mod I for bed mobility, min guard for transfers and min A for ambulation with 2x LoB requiring modA to steady. Pt refuses gait belt and use of RW to aid in ambulation. PT recommending SNF level rehab at discharge for decrease strength and balance. PT will continue to follow acutely.     Follow Up Recommendations SNF    Equipment Recommendations  Other (comment)(TBD at next venue)    Recommendations for Other Services OT consult     Precautions / Restrictions Precautions Precautions: Fall Precaution Comments: 2x falls in ED Restrictions Weight Bearing Restrictions: No      Mobility  Bed Mobility Overal bed mobility: Modified Independent             General bed mobility comments: increased effort to pull to EoB  Transfers Overall transfer level: Needs assistance Equipment used:  None Transfers: Sit to/from Stand Sit to Stand: Min guard         General transfer comment: min guard for safety, increased effort to steady in standing   Ambulation/Gait Ambulation/Gait assistance: Mod assist;Min assist Gait Distance (Feet): 20 Feet Assistive device: None Gait Pattern/deviations: Step-through pattern;Decreased step length - right;Decreased step length - left;Staggering left;Staggering right Gait velocity: slowed Gait velocity interpretation: <1.8 ft/sec, indicate of risk for recurrent falls General Gait Details: pt adamantly refused gait belt provided min A for steadying by holding gown, requires 2x modA for steadying with LoB       Balance Overall balance assessment: Needs assistance Sitting-balance support: Feet supported;No upper extremity supported Sitting balance-Leahy Scale: Fair     Standing balance support: No upper extremity supported;During functional activity Standing balance-Leahy Scale: Fair Standing balance comment: requires outside support for dynamic balance                             Pertinent Vitals/Pain Pain Assessment: No/denies pain    Home Living Family/patient expects to be discharged to:: Private residence Living Arrangements: Other (Comment)(Granddaughter ) Available Help at Discharge: Family;Available 24 hours/day Type of Home: Apartment Home Access: Stairs to enter   Entrance Stairs-Number of Steps: 12 Home Layout: One level Home Equipment: None      Prior Function Level of Independence: Needs assistance   Gait / Transfers Assistance Needed: household ambulation without AD  ADL's / Homemaking Assistance Needed: granddaughter provides iADLs due to increasing dementia  Extremity/Trunk Assessment   Upper Extremity Assessment Upper Extremity Assessment: Generalized weakness;Difficult to assess due to impaired cognition    Lower Extremity Assessment Lower Extremity Assessment: Generalized  weakness;Difficult to assess due to impaired cognition       Communication   Communication: No difficulties  Cognition Arousal/Alertness: Awake/alert Behavior During Therapy: Restless Overall Cognitive Status: History of cognitive impairments - at baseline                                 General Comments: pt oriented to self, with prompting states she is at [facility] due to [place], can't remember granddaughter's name or fact that she had fallen in ED      General Comments General comments (skin integrity, edema, etc.): SaO2 on RA 93%O2 after ambulation to door and back        Assessment/Plan    PT Assessment Patient needs continued PT services  PT Problem List Decreased strength;Decreased activity tolerance;Decreased balance;Decreased mobility;Decreased cognition;Decreased coordination;Decreased knowledge of use of DME;Decreased safety awareness       PT Treatment Interventions DME instruction;Gait training;Functional mobility training;Therapeutic activities;Therapeutic exercise;Balance training;Cognitive remediation;Patient/family education    PT Goals (Current goals can be found in the Care Plan section)  Acute Rehab PT Goals Patient Stated Goal: to get out of hospital  PT Goal Formulation: Patient unable to participate in goal setting Time For Goal Achievement: 05/16/19 Potential to Achieve Goals: Fair    Frequency Min 2X/week    AM-PAC PT "6 Clicks" Mobility  Outcome Measure Help needed turning from your back to your side while in a flat bed without using bedrails?: None Help needed moving from lying on your back to sitting on the side of a flat bed without using bedrails?: A Little Help needed moving to and from a bed to a chair (including a wheelchair)?: None Help needed standing up from a chair using your arms (e.g., wheelchair or bedside chair)?: None Help needed to walk in hospital room?: A Lot Help needed climbing 3-5 steps with a railing? : A Lot 6  Click Score: 19    End of Session   Activity Tolerance: Patient tolerated treatment well Patient left: in bed;with call bell/phone within reach;with bed alarm set Nurse Communication: Mobility status PT Visit Diagnosis: Unsteadiness on feet (R26.81);Other abnormalities of gait and mobility (R26.89);Muscle weakness (generalized) (M62.81);Difficulty in walking, not elsewhere classified (R26.2);Repeated falls (R29.6)    Time: [id number] PT Time Calculation (min) (ACUTE ONLY): 22 min   Charges:   PT Evaluation $PT Eval Moderate Complexity: 1 Mod          Betzaida Cremeens B. Migdalia Dk PT, DPT Acute Rehabilitation Services Pager [phone] Office [phone]   [place] 05/02/2019, 4:49 PM

## 2019-05-03 DIAGNOSIS — R7989 Other specified abnormal findings of blood chemistry: Secondary | ICD-10-CM

## 2019-05-03 LAB — CBC WITH DIFFERENTIAL/PLATELET
Abs Immature Granulocytes: 0.05 10*3/uL (ref 0.00–0.07)
Basophils Absolute: 0 10*3/uL (ref 0.0–0.1)
Basophils Relative: 1 %
Eosinophils Absolute: 0.1 10*3/uL (ref 0.0–0.5)
Eosinophils Relative: 2 %
HCT: 41.2 % (ref 36.0–46.0)
Hemoglobin: 13.9 g/dL (ref 12.0–15.0)
Immature Granulocytes: 1 %
Lymphocytes Relative: 34 %
Lymphs Abs: 1.7 10*3/uL (ref 0.7–4.0)
MCH: 29.8 pg (ref 26.0–34.0)
MCHC: 33.7 g/dL (ref 30.0–36.0)
MCV: 88.4 fL (ref 80.0–100.0)
Monocytes Absolute: 0.3 10*3/uL (ref 0.1–1.0)
Monocytes Relative: 7 %
Neutro Abs: 2.8 10*3/uL (ref 1.7–7.7)
Neutrophils Relative %: 55 %
Platelets: 223 10*3/uL (ref 150–400)
RBC: 4.66 MIL/uL (ref 3.87–5.11)
RDW: 12.2 % (ref 11.5–15.5)
WBC: 4.9 10*3/uL (ref 4.0–10.5)
nRBC: 0 % (ref 0.0–0.2)

## 2019-05-03 LAB — COMPREHENSIVE METABOLIC PANEL
ALT: 18 U/L (ref 0–44)
AST: 26 U/L (ref 15–41)
Albumin: 2.9 g/dL — ABNORMAL LOW (ref 3.5–5.0)
Alkaline Phosphatase: 65 U/L (ref 38–126)
Anion gap: 11 (ref 5–15)
BUN: 8 mg/dL (ref 8–23)
CO2: 25 mmol/L (ref 22–32)
Calcium: 9.2 mg/dL (ref 8.9–10.3)
Chloride: 101 mmol/L (ref 98–111)
Creatinine, Ser: 0.69 mg/dL (ref 0.44–1.00)
GFR calc Af Amer: >60 mL/min (ref >60)
GFR calc non Af Amer: >60 mL/min (ref >60)
Glucose, Bld: 136 mg/dL — ABNORMAL HIGH (ref 70–99)
Potassium: 4.2 mmol/L (ref 3.5–5.1)
Sodium: 137 mmol/L (ref 135–145)
Total Bilirubin: 0.5 mg/dL (ref 0.3–1.2)
Total Protein: 6.7 g/dL (ref 6.5–8.1)

## 2019-05-03 LAB — MAGNESIUM: Magnesium: 2 mg/dL (ref 1.7–2.4)

## 2019-05-03 LAB — D-DIMER, QUANTITATIVE: D-Dimer, Quant: 2.69 ug/mL-FEU — ABNORMAL HIGH (ref 0.00–0.50)

## 2019-05-03 LAB — FERRITIN: Ferritin: 107 ng/mL (ref 11–307)

## 2019-05-03 LAB — PHOSPHORUS: Phosphorus: 2.8 mg/dL (ref 2.5–4.6)

## 2019-05-03 LAB — GLUCOSE, CAPILLARY
Glucose-Capillary: 133 mg/dL — ABNORMAL HIGH (ref 70–99)
Glucose-Capillary: 154 mg/dL — ABNORMAL HIGH (ref 70–99)
Glucose-Capillary: 121 mg/dL — ABNORMAL HIGH (ref 70–99)
Glucose-Capillary: 124 mg/dL — ABNORMAL HIGH (ref 70–99)

## 2019-05-03 LAB — C-REACTIVE PROTEIN: CRP: 2.4 mg/dL — ABNORMAL HIGH (ref <1.0)

## 2019-05-03 NOTE — Evaluation (Signed)
Occupational Therapy Evaluation Patient Details Name: Joy Ward MRN: [id number] DOB: 1941-02-18 Today's Date: 05/03/2019    History of Present Illness 79 year old female with history of high cholesterol, diabetes, dementia who presents to the ED 04/30/19 with increasing confusion, intermittent hallucinations. Pt's caregiver is her granddaughter, Jonelle Sidle who states she can no longer take care of patient given her worsening mentation and hallucinations.  UTI was found and pt was start on ABX.    Clinical Impression   PTA pt living with granddaughter, whom states being caregiver has increased in burden since pt worsening cognition. At time of eval, pt is mod I for bed mobility and min A for sit <> stands. Pt with incontinence episode in bed, but unaware. She completed functional mobility at min A to bathroom in room with mod safety cues and use of grab bars. VSS throughout session. Noted pt with cognitive deficits that appeared to be improving to baseline (suspect had increased from acute UTI).  Given current status, recommend SNF at d/c for continued BADL progression prior to returning home. If returning home, pt will need 24/7 assist for safety with BADL/IADL in home environment. Will continue to follow per POC listed below.    Follow Up Recommendations  SNF;Supervision/Assistance - 24 hour;Other (comment)(pt would need 24/7 assist to go home for safety)    Equipment Recommendations  3 in 1 bedside commode    Recommendations for Other Services       Precautions / Restrictions Precautions Precautions: Fall Precaution Comments: 2x falls in ED Restrictions Weight Bearing Restrictions: No      Mobility Bed Mobility Overal bed mobility: Modified Independent             General bed mobility comments: increased effort to pull to EOB  Transfers Overall transfer level: Needs assistance Equipment used: None Transfers: Sit to/from Stand Sit to Stand: Min assist;Min guard          General transfer comment: min A to rise and steady from bed and toilet, improves with grab bar or arm rests of chair to min guard    Balance Overall balance assessment: Needs assistance Sitting-balance support: Feet supported;No upper extremity supported Sitting balance-Leahy Scale: Fair     Standing balance support: No upper extremity supported;During functional activity Standing balance-Leahy Scale: Poor Standing balance comment: min A for functional mobility to bathroom required from therapist this date                           ADL either performed or assessed with clinical judgement   ADL Overall ADL's : Needs assistance/impaired Eating/Feeding: Set up;Sitting   Grooming: Set up;Sitting   Upper Body Bathing: Minimal assistance;Sitting;Cueing for safety;Cueing for sequencing   Lower Body Bathing: Minimal assistance;Sit to/from stand;Sitting/lateral leans;Cueing for safety;Cueing for sequencing   Upper Body Dressing : Minimal assistance;Sitting;Cueing for sequencing;Cueing for compensatory techniques   Lower Body Dressing: Moderate assistance;Sit to/from stand;Sitting/lateral leans;Cueing for sequencing;Cueing for safety   Toilet Transfer: Minimal assistance;Regular Toilet;Grab bars;Cueing for safety;Cueing for sequencing Toilet Transfer Details (indicate cue type and reason): 1 person HHA to toilet in room, pt needing min A for sit <> stand from toilet wtih cues to use grab bar Toileting- Clothing Manipulation and Hygiene: Minimal assistance;Sit to/from stand   Tub/ Shower Transfer: Minimal assistance;Ambulation;Shower seat   Functional mobility during ADLs: Minimal assistance;Cueing for safety;Cueing for sequencing General ADL Comments: pt limited by baseline cognitive deficits, generalized weakness, and poor activity tolerance for safe and independent  BADL     Vision Patient Visual Report: No change from baseline Vision Assessment?: No apparent visual  deficits     Perception     Praxis      Pertinent Vitals/Pain Pain Assessment: No/denies pain     Hand Dominance     Extremity/Trunk Assessment Upper Extremity Assessment Upper Extremity Assessment: Generalized weakness   Lower Extremity Assessment Lower Extremity Assessment: Defer to PT evaluation       Communication Communication Communication: No difficulties   Cognition Arousal/Alertness: Awake/alert Behavior During Therapy: WFL for tasks assessed/performed Overall Cognitive Status: History of cognitive impairments - at baseline                                 General Comments: history of dementia, with impaired recall/STM. Is able to recall she is at hospital but not details of situation. Requires supervision for safety with all tasks   General Comments       Exercises     Shoulder Instructions      Home Living Family/patient expects to be discharged to:: Private residence Living Arrangements: Other (Comment)(grddtr) Available Help at Discharge: Family;Available 24 hours/day Type of Home: Apartment Home Access: Stairs to enter Entrance Stairs-Number of Steps: 12   Home Layout: One level     Bathroom Shower/Tub: Tub/shower unit         Home Equipment: None          Prior Functioning/Environment Level of Independence: Needs assistance  Gait / Transfers Assistance Needed: household ambulation without AD ADL's / Homemaking Assistance Needed: granddaughter provides iADLs due to increasing dementia            OT Problem List: Decreased strength;Decreased knowledge of use of DME or AE;Decreased activity tolerance;Decreased cognition;Cardiopulmonary status limiting activity;Impaired balance (sitting and/or standing);Decreased safety awareness;Pain      OT Treatment/Interventions: Self-care/ADL training;Therapeutic exercise;Patient/family education;Balance training;Energy conservation;Therapeutic activities;DME and/or AE  instruction;Cognitive remediation/compensation    OT Goals(Current goals can be found in the care plan section) Acute Rehab OT Goals Patient Stated Goal: get better OT Goal Formulation: With patient Time For Goal Achievement: 05/17/19 Potential to Achieve Goals: Good  OT Frequency: Min 2X/week   Barriers to D/C:            Co-evaluation              AM-PAC OT "6 Clicks" Daily Activity     Outcome Measure Help from another person eating meals?: A Little Help from another person taking care of personal grooming?: A Little Help from another person toileting, which includes using toliet, bedpan, or urinal?: A Little Help from another person bathing (including washing, rinsing, drying)?: A Little Help from another person to put on and taking off regular upper body clothing?: A Little Help from another person to put on and taking off regular lower body clothing?: A Little 6 Click Score: 18   End of Session Equipment Utilized During Treatment: Gait belt Nurse Communication: Mobility status;Patient requests pain meds  Activity Tolerance: Patient tolerated treatment well Patient left: in chair;with call bell/phone within reach;with chair alarm set  OT Visit Diagnosis: Unsteadiness on feet (R26.81);Other abnormalities of gait and mobility (R26.89);Muscle weakness (generalized) (M62.81);Other symptoms and signs involving cognitive function;Pain Pain - part of body: Shoulder(back)                Time: [id number] OT Time Calculation (min): 35 min Charges:  OT General Charges $OT Visit:  1 Visit OT Evaluation $OT Eval Moderate Complexity: 1 Mod OT Treatments $Self Care/Home Management : 8-22 mins  Zenovia Jarred, MSOT, OTR/L Acute Rehabilitation Services [facility] Office Number: [phone]  Zenovia Jarred 05/03/2019, 5:06 PM

## 2019-05-03 NOTE — Progress Notes (Addendum)
PROGRESS NOTE    Joy Ward    Code Status: Full Code  [id number] DOB: 11/12/1940 DOA: 04/30/2019  PCP: Sandrea Hughs, NP    Hospital Summary  This is a 79 year old female with history of advanced dementia, diabetes, hypertension, hypothyroidism who presented to the hospital with increased agitation and confusion.  Patient's caregiver is granddaughter.  Found to have UTI on admission and started on antibiotics.  Also found to be COVID-19 positive.  A & P   Active Problems:   UTI (urinary tract infection)   1. E. coli UTI a. Ceftriaxone-> Ancef, day 4/5 2. Altered mental status/metabolic encephalopathy likely secondary to E. coli in setting of dementia and possible polypharmacy a. resolved b. Discontinue Haldol and hydrocodone c. seroquel 3. Dementia a. Continue Aricept b. Seroquel as above 4. Type 2 diabetes On Metformin 5. Hyperlipidemia on statin 6. Debility a. PT/OT recommending SNF 7. Elevated D-dimer a. Initially 7.1 down trended to 1.6 now increased to 2.7 b. Bilateral lower extremity Doppler 8. Hypertension a. Continue Norvasc and Cozaar 9. COVID-19 a. Tested positive prior to Christmas b. Now feeling malaise, tolerating room air c. Continuous pulse ox 10. Glaucoma on eyedrops 11. Hypothyroidism on Synthroid 12. Upper kalemia and hypomagnesemia resolved  DVT prophylaxis: Heparin Family Communication: will call later today Disposition Plan: continue to follow with SW, Likely can be discharged in 1-2 days  Consultants  None  Procedures  None  Antibiotics   Anti-infectives (From admission, onward)   Start     Dose/Rate Route Frequency Ordered Stop   05/02/19 1330  cephALEXin (KEFLEX) capsule 500 mg     500 mg Oral Every 12 hours 05/02/19 1238 05/07/19 0959   05/01/19 1500  cefTRIAXone (ROCEPHIN) 1 g in sodium chloride 0.9 % 100 mL IVPB  Status:  Discontinued     1 g 200 mL/hr over 30 Minutes Intravenous Every 24 hours 04/30/19 1524 05/02/19  1238   04/30/19 1445  cefTRIAXone (ROCEPHIN) 1 g in sodium chloride 0.9 % 100 mL IVPB     1 g 200 mL/hr over 30 Minutes Intravenous  Once 04/30/19 1437 04/30/19 1555           Subjective   Comfortable, no complaints today  Objective   Vitals:   05/02/19 1438 05/02/19 1500 05/03/19 0516 05/03/19 1338  BP: 129/67 127/65 126/80 132/74  Pulse:  80 79 69  Resp: 15  18 19   Temp: 98.3 F (36.8 C) 98.2 F (36.8 C) 98.4 F (36.9 C) 98.9 F (37.2 C)  TempSrc: Oral Oral Oral Oral  SpO2: 99% 99% 97% 98%  Weight:      Height:        Intake/Output Summary (Last 24 hours) at 05/03/2019 1915 Last data filed at 05/03/2019 0518 Gross per 24 hour  Intake 240 ml  Output --  Net 240 ml   Filed Weights   04/30/19 1235  Weight: 61.2 kg    Examination:  Physical Exam Vitals and nursing note reviewed.  Constitutional:      Appearance: Normal appearance.  HENT:     Head: Normocephalic and atraumatic.     Nose: Nose normal.     Mouth/Throat:     Mouth: Mucous membranes are moist.  Eyes:     Extraocular Movements: Extraocular movements intact.  Cardiovascular:     Rate and Rhythm: Normal rate and regular rhythm.  Pulmonary:     Effort: Pulmonary effort is normal.     Breath sounds: Normal breath sounds.  Abdominal:     General: Abdomen is flat.     Palpations: Abdomen is soft.  Musculoskeletal:        General: No swelling. Normal range of motion.     Cervical back: Normal range of motion. No rigidity.  Neurological:     General: No focal deficit present.     Mental Status: She is alert. Mental status is at baseline.  Psychiatric:        Mood and Affect: Mood normal.        Behavior: Behavior normal.     Data Reviewed: I have personally reviewed following labs and imaging studies  CBC: Recent Labs  Lab 04/30/19 1250 05/01/19 0500 05/02/19 0439 05/03/19 0845  WBC 6.0 5.0 6.5 4.9  NEUTROABS 4.4 3.5 3.8 2.8  HGB 13.8 13.0 13.9 13.9  HCT 40.2 37.8 41.2 41.2  MCV  88.4 86.5 88.2 88.4  PLT 175 195 222 [id number]   Basic Metabolic Panel: Recent Labs  Lab 04/30/19 1250 05/01/19 0500 05/02/19 0439 05/03/19 0845  NA 136 139 139 137  K 3.0* 2.9* 4.4 4.2  CL 100 100 102 101  CO2 26 26 24 25   GLUCOSE 186* 131* 144* 136*  BUN 11 7* 7* 8  CREATININE 0.59 0.53 0.54 0.69  CALCIUM 9.4 9.1 9.3 9.2  MG  --  1.7 2.3 2.0  PHOS  --  2.6 2.5 2.8   GFR: Estimated Creatinine Clearance: 53 mL/min (by C-G formula based on SCr of 0.69 mg/dL). Liver Function Tests: Recent Labs  Lab 04/30/19 1250 05/01/19 0500 05/02/19 0439 05/03/19 0845  AST 21 32 27 26  ALT 20 20 20 18   ALKPHOS 72 67 71 65  BILITOT 0.6 0.6 0.6 0.5  PROT 7.0 6.7 7.1 6.7  ALBUMIN 3.1* 2.9* 3.0* 2.9*   No results for input(s): LIPASE, AMYLASE in the last 168 hours. No results for input(s): AMMONIA in the last 168 hours. Coagulation Profile: No results for input(s): INR, PROTIME in the last 168 hours. Cardiac Enzymes: No results for input(s): CKTOTAL, CKMB, CKMBINDEX, TROPONINI in the last 168 hours. BNP (last 3 results) No results for input(s): PROBNP in the last 8760 hours. HbA1C: No results for input(s): HGBA1C in the last 72 hours. CBG: Recent Labs  Lab 05/02/19 1653 05/02/19 2046 05/03/19 0741 05/03/19 1139 05/03/19 1658  GLUCAP 115* 144* 133* 154* 121*   Lipid Profile: No results for input(s): CHOL, HDL, LDLCALC, TRIG, CHOLHDL, LDLDIRECT in the last 72 hours. Thyroid Function Tests: No results for input(s): TSH, T4TOTAL, FREET4, T3FREE, THYROIDAB in the last 72 hours. Anemia Panel: Recent Labs    05/02/19 0439 05/03/19 0845  FERRITIN 89 107   Sepsis Labs: Recent Labs  Lab 04/30/19 1505  LATICACIDVEN 1.7    Recent Results (from the past 240 hour(s))  Respiratory Panel by RT PCR (Flu A&B, Covid) - Nasopharyngeal Swab     Status: Abnormal   Collection Time: 04/30/19  2:37 PM   Specimen: Nasopharyngeal Swab  Result Value Ref Range Status   SARS Coronavirus 2 by  RT PCR POSITIVE (A) NEGATIVE Final    Comment: RESULT CALLED TO, READ BACK BY AND VERIFIED WITH: rn j blue [id number] cj (NOTE) SARS-CoV-2 target nucleic acids are DETECTED. SARS-CoV-2 RNA is generally detectable in upper respiratory specimens  during the acute phase of infection. Positive results are indicative of the presence of the identified virus, but do not rule out bacterial infection or co-infection with other pathogens not detected by the  test. Clinical correlation with patient history and other diagnostic information is necessary to determine patient infection status. The expected result is Negative. Fact Sheet for Patients:  PinkCheek.be Fact Sheet for Healthcare Providers: GravelBags.it This test is not yet approved or cleared by the [place] FDA and  has been authorized for detection and/or diagnosis of SARS-CoV-2 by FDA under an Emergency Use Authorization (EUA).  This EUA will remain in effect (meaning this test can be used) for the dura tion of  the COVID-19 declaration under Section 564(b)(1) of the Act, 21 U.S.C. section 360bbb-3(b)(1), unless the authorization is terminated or revoked sooner.    Influenza A by PCR NEGATIVE NEGATIVE Final   Influenza B by PCR NEGATIVE NEGATIVE Final    Comment: (NOTE) The Xpert Xpress SARS-CoV-2/FLU/RSV assay is intended as an aid in  the diagnosis of influenza from Nasopharyngeal swab specimens and  should not be used as a sole basis for treatment. Nasal washings and  aspirates are unacceptable for Xpert Xpress SARS-CoV-2/FLU/RSV  testing. Fact Sheet for Patients: PinkCheek.be Fact Sheet for Healthcare Providers: GravelBags.it This test is not yet approved or cleared by the [place] FDA and  has been authorized for detection and/or diagnosis of SARS-CoV-2 by  FDA under an Emergency Use Authorization  (EUA). This EUA will remain  in effect (meaning this test can be used) for the duration of the  Covid-19 declaration under Section 564(b)(1) of the Act, 21  U.S.C. section 360bbb-3(b)(1), unless the authorization is  terminated or revoked. Performed at [facility], [place] [address], [place], [place] [address]   Urine culture     Status: Abnormal   Collection Time: 04/30/19  2:47 PM   Specimen: Urine, Random  Result Value Ref Range Status   Specimen Description URINE, RANDOM  Final   Special Requests NONE  Final   Culture (A)  Final    >=100,000 COLONIES/mL ESCHERICHIA COLI Two isolates with different morphologies were identified as the same organism.The most resistant organism was reported. Performed at [facility], [place] [address] [place], [place] [address]    Report Status 05/02/2019 FINAL  Final   Organism ID, Bacteria ESCHERICHIA COLI (A)  Final      Susceptibility   Escherichia coli - MIC*    AMPICILLIN <=2 SENSITIVE Sensitive     CEFAZOLIN <=4 SENSITIVE Sensitive     CEFTRIAXONE <=0.25 SENSITIVE Sensitive     CIPROFLOXACIN <=0.25 SENSITIVE Sensitive     GENTAMICIN <=1 SENSITIVE Sensitive     IMIPENEM <=0.25 SENSITIVE Sensitive     NITROFURANTOIN <=16 SENSITIVE Sensitive     TRIMETH/SULFA <=20 SENSITIVE Sensitive     AMPICILLIN/SULBACTAM <=2 SENSITIVE Sensitive     PIP/TAZO <=4 SENSITIVE Sensitive     * >=100,000 COLONIES/mL ESCHERICHIA COLI  Blood culture (routine x 2)     Status: None (Preliminary result)   Collection Time: 04/30/19  3:00 PM   Specimen: Right Antecubital; Blood  Result Value Ref Range Status   Specimen Description RIGHT ANTECUBITAL  Final   Special Requests   Final    BOTTLES DRAWN AEROBIC AND ANAEROBIC Blood Culture results may not be optimal due to an inadequate volume of blood received in culture bottles   Culture   Final    NO GROWTH 3 DAYS Performed at [facility], [address] [place],  [address]    Report Status  PENDING  Incomplete  Blood culture (routine x 2)     Status: None (  Preliminary result)   Collection Time: 04/30/19  3:05 PM   Specimen: Left Antecubital; Blood  Result Value Ref Range Status   Specimen Description LEFT ANTECUBITAL  Final   Special Requests   Final    BOTTLES DRAWN AEROBIC AND ANAEROBIC Blood Culture results may not be optimal due to an inadequate volume of blood received in culture bottles   Culture   Final    NO GROWTH 3 DAYS Performed at [facility], [place] [address] [place], [place] [address]    Report Status PENDING  Incomplete         Radiology Studies: No results found.      Scheduled Meds: . amLODipine  5 mg Oral Daily  . cephALEXin  500 mg Oral Q12H  . donepezil  5 mg Oral QHS  . heparin  5,000 Units Subcutaneous Q12H  . insulin aspart  0-9 Units Subcutaneous TID WC  . latanoprost  1 drop Both Eyes QHS  . levothyroxine  75 mcg Oral Q0600  . losartan  100 mg Oral Daily  . metFORMIN  500 mg Oral BID WC  . pravastatin  40 mg Oral q1800  . QUEtiapine  25 mg Oral BID   Continuous Infusions:   LOS: 3 days    Time spent: 20 minutes with over 50% of the time coordinating the patient's care    Harold Hedge, DO [facility] Pager [phone]  If 7PM-7AM, please contact night-coverage [email or web address] Password [facility] 05/03/2019, 7:15 PM

## 2019-05-04 ENCOUNTER — Inpatient Hospital Stay (HOSPITAL_COMMUNITY): Payer: Medicare Other

## 2019-05-04 DIAGNOSIS — U071 COVID-19: Secondary | ICD-10-CM

## 2019-05-04 DIAGNOSIS — I82409 Acute embolism and thrombosis of unspecified deep veins of unspecified lower extremity: Secondary | ICD-10-CM

## 2019-05-04 LAB — COMPREHENSIVE METABOLIC PANEL
ALT: 21 U/L (ref 0–44)
AST: 29 U/L (ref 15–41)
Albumin: 2.7 g/dL — ABNORMAL LOW (ref 3.5–5.0)
Alkaline Phosphatase: 60 U/L (ref 38–126)
Anion gap: 6 (ref 5–15)
BUN: 7 mg/dL — ABNORMAL LOW (ref 8–23)
CO2: 32 mmol/L (ref 22–32)
Calcium: 9.7 mg/dL (ref 8.9–10.3)
Chloride: 101 mmol/L (ref 98–111)
Creatinine, Ser: 0.87 mg/dL (ref 0.44–1.00)
GFR calc Af Amer: >60 mL/min (ref >60)
GFR calc non Af Amer: >60 mL/min (ref >60)
Glucose, Bld: 140 mg/dL — ABNORMAL HIGH (ref 70–99)
Potassium: 5.2 mmol/L — ABNORMAL HIGH (ref 3.5–5.1)
Sodium: 139 mmol/L (ref 135–145)
Total Bilirubin: 0.5 mg/dL (ref 0.3–1.2)
Total Protein: 6.2 g/dL — ABNORMAL LOW (ref 6.5–8.1)

## 2019-05-04 LAB — CBC WITH DIFFERENTIAL/PLATELET
Abs Immature Granulocytes: 0 10*3/uL (ref 0.00–0.07)
Basophils Absolute: 0 10*3/uL (ref 0.0–0.1)
Basophils Relative: 1 %
Eosinophils Absolute: 0.3 10*3/uL (ref 0.0–0.5)
Eosinophils Relative: 8 %
HCT: 40.2 % (ref 36.0–46.0)
Hemoglobin: 13.4 g/dL (ref 12.0–15.0)
Lymphocytes Relative: 31 %
Lymphs Abs: 1.1 10*3/uL (ref 0.7–4.0)
MCH: 29.7 pg (ref 26.0–34.0)
MCHC: 33.3 g/dL (ref 30.0–36.0)
MCV: 89.1 fL (ref 80.0–100.0)
Monocytes Absolute: 0.5 10*3/uL (ref 0.1–1.0)
Monocytes Relative: 13 %
Neutro Abs: 1.7 10*3/uL (ref 1.7–7.7)
Neutrophils Relative %: 47 %
Platelets: 281 10*3/uL (ref 150–400)
RBC: 4.51 MIL/uL (ref 3.87–5.11)
RDW: 12.1 % (ref 11.5–15.5)
WBC: 3.7 10*3/uL — ABNORMAL LOW (ref 4.0–10.5)
nRBC: 0 % (ref 0.0–0.2)
nRBC: 0 /100 WBC

## 2019-05-04 LAB — GLUCOSE, CAPILLARY
Glucose-Capillary: 105 mg/dL — ABNORMAL HIGH (ref 70–99)
Glucose-Capillary: 128 mg/dL — ABNORMAL HIGH (ref 70–99)
Glucose-Capillary: 135 mg/dL — ABNORMAL HIGH (ref 70–99)
Glucose-Capillary: 115 mg/dL — ABNORMAL HIGH (ref 70–99)

## 2019-05-04 LAB — C-REACTIVE PROTEIN: CRP: 0.9 mg/dL (ref <1.0)

## 2019-05-04 LAB — D-DIMER, QUANTITATIVE: D-Dimer, Quant: 1.98 ug/mL-FEU — ABNORMAL HIGH (ref 0.00–0.50)

## 2019-05-04 LAB — PHOSPHORUS: Phosphorus: 3.6 mg/dL (ref 2.5–4.6)

## 2019-05-04 LAB — MAGNESIUM: Magnesium: 2 mg/dL (ref 1.7–2.4)

## 2019-05-04 LAB — FERRITIN: Ferritin: 77 ng/mL (ref 11–307)

## 2019-05-04 MED ORDER — ENOXAPARIN SODIUM 40 MG/0.4ML ~~LOC~~ SOLN
40.0000 mg | SUBCUTANEOUS | Status: DC
  Administered 2019-05-04 – 2019-05-06 (×3): 40 mg via SUBCUTANEOUS
  Filled 2019-05-04 (×3): qty 0.4

## 2019-05-04 MED ORDER — QUETIAPINE FUMARATE 50 MG PO TABS
25.0000 mg | ORAL_TABLET | Freq: Every day | ORAL | Status: DC
  Administered 2019-05-04 – 2019-05-06 (×3): 25 mg via ORAL
  Filled 2019-05-04 (×4): qty 1

## 2019-05-04 MED ORDER — QUETIAPINE FUMARATE 25 MG PO TABS: 25.0000 mg | ORAL_TABLET | Freq: Every day | ORAL | Status: DC

## 2019-05-04 NOTE — Progress Notes (Signed)
Physical Therapy Treatment Patient Details Name: Joy Ward MRN: [id number] DOB: November 19, 1940 Today's Date: 05/04/2019    History of Present Illness 79 year old female with history of high cholesterol, diabetes, dementia who presents to the ED 04/30/19 with increasing confusion, intermittent hallucinations. Pt's caregiver is her granddaughter, Jonelle Sidle who states she can no longer take care of patient given her worsening mentation and hallucinations.  UTI was found and pt was start on ABX.     PT Comments    Pt progressing with mobility. Continue to recommend SNF at DC.    Follow Up Recommendations  SNF     Equipment Recommendations  Other (comment)(TBD at next venue)    Recommendations for Other Services OT consult     Precautions / Restrictions Precautions Precautions: Fall Precaution Comments: 2x falls in ED Restrictions Weight Bearing Restrictions: No    Mobility  Bed Mobility Overal bed mobility: Needs Assistance Bed Mobility: Supine to Sit;Sit to Supine     Supine to sit: Min assist Sit to supine: Supervision   General bed mobility comments: Assist to initiate trunk elevation into sitting due to cognition  Transfers Overall transfer level: Needs assistance Equipment used: None Transfers: Sit to/from Stand Sit to Stand: Min assist         General transfer comment: assist for balance  Ambulation/Gait Ambulation/Gait assistance: Min assist Gait Distance (Feet): 90 Feet Assistive device: 1 person hand held assist Gait Pattern/deviations: Step-through pattern;Decreased step length - right;Decreased step length - left Gait velocity: decr Gait velocity interpretation: <1.8 ft/sec, indicate of risk for recurrent falls General Gait Details: Assist for balance.    Stairs             Wheelchair Mobility    Modified Rankin (Stroke Patients Only)       Balance Overall balance assessment: Needs assistance Sitting-balance support: Feet supported;No  upper extremity supported Sitting balance-Leahy Scale: Fair     Standing balance support: No upper extremity supported;During functional activity Standing balance-Leahy Scale: Fair                              Cognition Arousal/Alertness: Awake/alert Behavior During Therapy: WFL for tasks assessed/performed Overall Cognitive Status: History of cognitive impairments - at baseline                                        Exercises      General Comments General comments (skin integrity, edema, etc.): VSS      Pertinent Vitals/Pain      Home Living                      Prior Function            PT Goals (current goals can now be found in the care plan section) Acute Rehab PT Goals Patient Stated Goal: to get out of hospital  PT Goal Formulation: With patient Time For Goal Achievement: 05/18/19 Potential to Achieve Goals: Good Progress towards PT goals: Progressing toward goals    Frequency    Min 2X/week      PT Plan Current plan remains appropriate    Co-evaluation              AM-PAC PT "6 Clicks" Mobility   Outcome Measure  Help needed turning from your back to your side while in a flat  bed without using bedrails?: None Help needed moving from lying on your back to sitting on the side of a flat bed without using bedrails?: A Little Help needed moving to and from a bed to a chair (including a wheelchair)?: A Little Help needed standing up from a chair using your arms (e.g., wheelchair or bedside chair)?: A Little Help needed to walk in hospital room?: A Little Help needed climbing 3-5 steps with a railing? : A Lot 6 Click Score: 18    End of Session   Activity Tolerance: Patient tolerated treatment well Patient left: in bed;with call bell/phone within reach;with bed alarm set Nurse Communication: Mobility status PT Visit Diagnosis: Unsteadiness on feet (R26.81);Other abnormalities of gait and mobility  (R26.89);Muscle weakness (generalized) (M62.81);Difficulty in walking, not elsewhere classified (R26.2);Repeated falls (R29.6)     Time: [id number] PT Time Calculation (min) (ACUTE ONLY): 15 min  Charges:  $Gait Training: 8-22 mins                     Page Pager [phone] Office [place] 05/04/2019, 2:58 PM

## 2019-05-04 NOTE — Progress Notes (Signed)
PROGRESS NOTE    Joy Ward    Code Status: Full Code  SD:[id number] DOB: [birth year] DOA: 04/30/2019  PCP: Sandrea Hughs, NP    Hospital Summary  This is a 79 year old female with history of advanced dementia, diabetes, hypertension, hypothyroidism who presented to the hospital with increased agitation and confusion.  Patient's caregiver is granddaughter.  Found to have UTI on admission and started on antibiotics.  Also found to be COVID-19 positive.  A & P   Active Problems:   UTI (urinary tract infection)   1. E. coli UTI a. Ceftriaxone-> Ancef, day 5/5 2. Altered mental status/metabolic encephalopathy likely secondary to E. coli in setting of dementia and possible polypharmacy a. resolved b. Discontinue Haldol and hydrocodone c. Decrease seroquel to 25 mg QHS, from bid  3. Dementia a. Continue Aricept b. Seroquel as above 4. Type 2 diabetes On Metformin 5. Hyperlipidemia on statin 6. Debility a. PT/OT recommending SNF b. SW consulted 7. Elevated D-dimer a. Initially 7.1 down trended to 1.6 now increased to 2.7 b. Bilateral lower extremity Doppler 8. Hypertension a. Continue Norvasc and Cozaar 9. COVID-19 a. Tested positive prior to Christmas b. Now feeling malaise, tolerating room air c. Continuous pulse ox 10. Glaucoma on eyedrops 11. Hypothyroidism on Synthroid 12. Upper kalemia and hypomagnesemia resolved  DVT prophylaxis: Heparin Family Communication: will call later today Disposition Plan: continue to follow with SW, Likely can be discharged in 1-2 days  Consultants  None  Procedures  None  Antibiotics   Anti-infectives (From admission, onward)   Start     Dose/Rate Route Frequency Ordered Stop   05/02/19 1330  cephALEXin (KEFLEX) capsule 500 mg     500 mg Oral Every 12 hours 05/02/19 1238 05/07/19 0959   05/01/19 1500  cefTRIAXone (ROCEPHIN) 1 g in sodium chloride 0.9 % 100 mL IVPB  Status:  Discontinued     1 g 200 mL/hr over 30 Minutes  Intravenous Every 24 hours 04/30/19 1524 05/02/19 1238   04/30/19 1445  cefTRIAXone (ROCEPHIN) 1 g in sodium chloride 0.9 % 100 mL IVPB     1 g 200 mL/hr over 30 Minutes Intravenous  Once 04/30/19 1437 04/30/19 1555           Subjective   Complains of generalized myalgias and malaise today. Feeling tired today. Some shortness of breath at rest. No chest pain. No other complaints.No overnight events.  Objective   Vitals:   05/03/19 2128 05/04/19 0432 05/04/19 0433 05/04/19 0910  BP: 98/71 117/63  130/78  Pulse: 71 72    Resp: 18 17    Temp: 98.7 F (37.1 C) 97.8 F (36.6 C) 97.8 F (36.6 C)   TempSrc:  Oral    SpO2: 96% 95%    Weight:      Height:        Intake/Output Summary (Last 24 hours) at 05/04/2019 1123 Last data filed at 05/04/2019 0900 Gross per 24 hour  Intake 240 ml  Output --  Net 240 ml   Filed Weights   04/30/19 1235  Weight: 61.2 kg    Examination:  Physical Exam Vitals and nursing note reviewed.  Constitutional:      Comments: tired  HENT:     Mouth/Throat:     Comments: Mask on Eyes:     Extraocular Movements: Extraocular movements intact.  Cardiovascular:     Rate and Rhythm: Normal rate and regular rhythm.  Pulmonary:     Effort: Pulmonary effort is normal. No  respiratory distress.     Breath sounds: No wheezing.  Abdominal:     General: Abdomen is flat. There is no distension.  Musculoskeletal:     Comments: Bilateral lower extremity tenderness  Skin:    General: Skin is warm.     Coloration: Skin is not jaundiced.  Neurological:     Mental Status: She is alert. Mental status is at baseline.  Psychiatric:        Mood and Affect: Mood normal.        Behavior: Behavior normal.        Thought Content: Thought content normal.        Judgment: Judgment normal.     Data Reviewed: I have personally reviewed following labs and imaging studies  CBC: Recent Labs  Lab 04/30/19 1250 05/01/19 0500 05/02/19 0439 05/03/19 0845  05/04/19 0500  WBC 6.0 5.0 6.5 4.9 3.7*  NEUTROABS 4.4 3.5 3.8 2.8 1.7  HGB 13.8 13.0 13.9 13.9 13.4  HCT 40.2 37.8 41.2 41.2 40.2  MCV 88.4 86.5 88.2 88.4 89.1  PLT 175 195 222 223 [id number]   Basic Metabolic Panel: Recent Labs  Lab 04/30/19 1250 05/01/19 0500 05/02/19 0439 05/03/19 0845 05/04/19 0500  NA 136 139 139 137 139  K 3.0* 2.9* 4.4 4.2 5.2*  CL 100 100 102 101 101  CO2 26 26 24 25  32  GLUCOSE 186* 131* 144* 136* 140*  BUN 11 7* 7* 8 7*  CREATININE 0.59 0.53 0.54 0.69 0.87  CALCIUM 9.4 9.1 9.3 9.2 9.7  MG  --  1.7 2.3 2.0 2.0  PHOS  --  2.6 2.5 2.8 3.6   GFR: Estimated Creatinine Clearance: 48.7 mL/min (by C-G formula based on SCr of 0.87 mg/dL). Liver Function Tests: Recent Labs  Lab 04/30/19 1250 05/01/19 0500 05/02/19 0439 05/03/19 0845 05/04/19 0500  AST 21 32 27 26 29   ALT 20 20 20 18 21   ALKPHOS 72 67 71 65 60  BILITOT 0.6 0.6 0.6 0.5 0.5  PROT 7.0 6.7 7.1 6.7 6.2*  ALBUMIN 3.1* 2.9* 3.0* 2.9* 2.7*   No results for input(s): LIPASE, AMYLASE in the last 168 hours. No results for input(s): AMMONIA in the last 168 hours. Coagulation Profile: No results for input(s): INR, PROTIME in the last 168 hours. Cardiac Enzymes: No results for input(s): CKTOTAL, CKMB, CKMBINDEX, TROPONINI in the last 168 hours. BNP (last 3 results) No results for input(s): PROBNP in the last 8760 hours. HbA1C: No results for input(s): HGBA1C in the last 72 hours. CBG: Recent Labs  Lab 05/03/19 0741 05/03/19 1139 05/03/19 1658 05/03/19 2127 05/04/19 0741  GLUCAP 133* 154* 121* 124* 105*   Lipid Profile: No results for input(s): CHOL, HDL, LDLCALC, TRIG, CHOLHDL, LDLDIRECT in the last 72 hours. Thyroid Function Tests: No results for input(s): TSH, T4TOTAL, FREET4, T3FREE, THYROIDAB in the last 72 hours. Anemia Panel: Recent Labs    05/03/19 0845 05/04/19 0500  FERRITIN 107 77   Sepsis Labs: Recent Labs  Lab 04/30/19 1505  LATICACIDVEN 1.7    Recent Results  (from the past 240 hour(s))  Respiratory Panel by RT PCR (Flu A&B, Covid) - Nasopharyngeal Swab     Status: Abnormal   Collection Time: 04/30/19  2:37 PM   Specimen: Nasopharyngeal Swab  Result Value Ref Range Status   SARS Coronavirus 2 by RT PCR POSITIVE (A) NEGATIVE Final    Comment: RESULT CALLED TO, READ BACK BY AND VERIFIED WITH: rn j blue [id number] (NOTE)  SARS-CoV-2 target nucleic acids are DETECTED. SARS-CoV-2 RNA is generally detectable in upper respiratory specimens  during the acute phase of infection. Positive results are indicative of the presence of the identified virus, but do not rule out bacterial infection or co-infection with other pathogens not detected by the test. Clinical correlation with patient history and other diagnostic information is necessary to determine patient infection status. The expected result is Negative. Fact Sheet for Patients:  PinkCheek.be Fact Sheet for Healthcare Providers: GravelBags.it This test is not yet approved or cleared by the [place] FDA and  has been authorized for detection and/or diagnosis of SARS-CoV-2 by FDA under an Emergency Use Authorization (EUA).  This EUA will remain in effect (meaning this test can be used) for the dura tion of  the COVID-19 declaration under Section 564(b)(1) of the Act, 21 U.S.C. section 360bbb-3(b)(1), unless the authorization is terminated or revoked sooner.    Influenza A by PCR NEGATIVE NEGATIVE Final   Influenza B by PCR NEGATIVE NEGATIVE Final    Comment: (NOTE) The Xpert Xpress SARS-CoV-2/FLU/RSV assay is intended as an aid in  the diagnosis of influenza from Nasopharyngeal swab specimens and  should not be used as a sole basis for treatment. Nasal washings and  aspirates are unacceptable for Xpert Xpress SARS-CoV-2/FLU/RSV  testing. Fact Sheet for Patients: PinkCheek.be Fact Sheet for  Healthcare Providers: GravelBags.it This test is not yet approved or cleared by the [place] FDA and  has been authorized for detection and/or diagnosis of SARS-CoV-2 by  FDA under an Emergency Use Authorization (EUA). This EUA will remain  in effect (meaning this test can be used) for the duration of the  Covid-19 declaration under Section 564(b)(1) of the Act, 21  U.S.C. section 360bbb-3(b)(1), unless the authorization is  terminated or revoked. Performed at [facility], [place] [address], [place], [place] [address]   Urine culture     Status: Abnormal   Collection Time: 04/30/19  2:47 PM   Specimen: Urine, Random  Result Value Ref Range Status   Specimen Description URINE, RANDOM  Final   Special Requests NONE  Final   Culture (A)  Final    >=100,000 COLONIES/mL ESCHERICHIA COLI Two isolates with different morphologies were identified as the same organism.The most resistant organism was reported. Performed at [facility], [place] [address] [place], [place] [address]    Report Status 05/02/2019 FINAL  Final   Organism ID, Bacteria ESCHERICHIA COLI (A)  Final      Susceptibility   Escherichia coli - MIC*    AMPICILLIN <=2 SENSITIVE Sensitive     CEFAZOLIN <=4 SENSITIVE Sensitive     CEFTRIAXONE <=0.25 SENSITIVE Sensitive     CIPROFLOXACIN <=0.25 SENSITIVE Sensitive     GENTAMICIN <=1 SENSITIVE Sensitive     IMIPENEM <=0.25 SENSITIVE Sensitive     NITROFURANTOIN <=16 SENSITIVE Sensitive     TRIMETH/SULFA <=20 SENSITIVE Sensitive     AMPICILLIN/SULBACTAM <=2 SENSITIVE Sensitive     PIP/TAZO <=4 SENSITIVE Sensitive     * >=100,000 COLONIES/mL ESCHERICHIA COLI  Blood culture (routine x 2)     Status: None (Preliminary result)   Collection Time: 04/30/19  3:00 PM   Specimen: Right Antecubital; Blood  Result Value Ref Range Status   Specimen Description RIGHT ANTECUBITAL  Final   Special Requests   Final    BOTTLES DRAWN AEROBIC AND  ANAEROBIC Blood Culture results may not be optimal due to an inadequate volume of blood received  in culture bottles   Culture   Final    NO GROWTH 3 DAYS Performed at [facility], [place] [address], [place], [place] [address]    Report Status PENDING  Incomplete  Blood culture (routine x 2)     Status: None (Preliminary result)   Collection Time: 04/30/19  3:05 PM   Specimen: Left Antecubital; Blood  Result Value Ref Range Status   Specimen Description LEFT ANTECUBITAL  Final   Special Requests   Final    BOTTLES DRAWN AEROBIC AND ANAEROBIC Blood Culture results may not be optimal due to an inadequate volume of blood received in culture bottles   Culture   Final    NO GROWTH 3 DAYS Performed at [facility], [place] [address] [place], [place] [address]    Report Status PENDING  Incomplete         Radiology Studies: No results found.      Scheduled Meds: . amLODipine  5 mg Oral Daily  . cephALEXin  500 mg Oral Q12H  . donepezil  5 mg Oral QHS  . heparin  5,000 Units Subcutaneous Q12H  . insulin aspart  0-9 Units Subcutaneous TID WC  . latanoprost  1 drop Both Eyes QHS  . levothyroxine  75 mcg Oral Q0600  . losartan  100 mg Oral Daily  . metFORMIN  500 mg Oral BID WC  . pravastatin  40 mg Oral q1800  . QUEtiapine  25 mg Oral QHS   Continuous Infusions:   LOS: 4 days    Time spent: 25 minutes with over 50% of the time coordinating the patient's care    Harold Hedge, DO [facility] Pager [phone]  If 7PM-7AM, please contact night-coverage [email or web address] Password [facility] 05/04/2019, 11:23 AM

## 2019-05-04 NOTE — NC FL2 (Signed)
**Note Joy-Identified via Obfuscation** [place] MEDICAID FL2 LEVEL OF CARE SCREENING TOOL     IDENTIFICATION  Patient Name: Joy Ward Birthdate: 1940/12/18 Sex: female Admission Date (Current Location): 04/30/2019  [facility] and [place] Number:  Herbalist and Address:  [facility] [place], [place] [address], [place], [place] [address]      Provider Number: [id number]  Attending Physician Name and Address:  Harold Hedge, MD  Relative Name and Phone Number:  Jonelle Sidle granddaughter, [phone]    Current Level of Care: Hospital Recommended Level of Care: [place] Prior Approval Number:    Date Approved/Denied:   PASRR Number: [id number]  Discharge Plan: SNF    Current Diagnoses: Patient Active Problem List   Diagnosis Date Noted  . Altered mental status   . UTI (urinary tract infection) 04/30/2019  . Dementia without behavioral disturbance ([place])   . Vascular dementia with behavior disturbance ([place]) 04/14/2019  . Primary insomnia 04/14/2019  . Hypothyroidism 04/14/2019  . Diabetes mellitus ([place]) 04/14/2019  . Memory loss or impairment 03/29/2019  . Diabetic peripheral neuropathy associated with type 2 diabetes mellitus ([place]) 12/29/2017  . Peripheral edema 12/29/2017  . Elevated liver enzymes 10/15/2016  . Chest pain 04/22/2016  . Systolic murmur [id number]  . Hypercalcemia 04/22/2016  . Hyperlipidemia 04/22/2016  . Baker's cyst of knee, right 04/21/2016  . HYPOKALEMIA 01/24/2010  . CONSTIPATION 12/23/2009  . INTERMITTENT VERTIGO 12/23/2009  . SINUSITIS, ACUTE 04/25/2009  . VAGINITIS, CANDIDAL 02/14/2009  . HIP PAIN, RIGHT 02/14/2009  . HYPERLIPIDEMIA, MIXED 01/01/2009  . BACK PAIN, LUMBAR, WITH RADICULOPATHY 12/25/2008  . KNEE PAIN, BILATERAL 12/18/2008  . TROCHANTERIC BURSITIS, BILATERAL 12/18/2008  . Disturbance in sleep behavior 12/18/2008  . ONYCHOMYCOSIS, TOENAILS 10/30/2008  . ENTERITIS, CLOSTRIDIUM DIFFICILE 10/15/2006  . Diabetes type 2,  uncontrolled ([place]) 09/18/2006  . Essential hypertension 09/18/2006  . Allergic rhinitis 09/18/2006  . GOITER, MULTINODULAR 06/29/2006  . STROKE 04/27/2001    Orientation RESPIRATION BLADDER Height & Weight     Self  Normal Continent Weight: 135 lb (61.2 kg) Height:  5\' 5"  (165.1 cm)  BEHAVIORAL SYMPTOMS/MOOD NEUROLOGICAL BOWEL NUTRITION STATUS  (Dementia)   Continent Diet(Please DC Summary)  AMBULATORY STATUS COMMUNICATION OF NEEDS Skin   Limited Assist Verbally Normal                       Personal Care Assistance Level of Assistance  Bathing, Feeding, Dressing Bathing Assistance: Limited assistance Feeding assistance: Limited assistance Dressing Assistance: Limited assistance     Functional Limitations Info  Sight, Hearing, Speech Sight Info: Adequate Hearing Info: Adequate Speech Info: Adequate    SPECIAL CARE FACTORS FREQUENCY  PT (By licensed PT), OT (By licensed OT)     PT Frequency: 5x OT Frequency: 5x            Contractures Contractures Info: Not present    Additional Factors Info  Code Status, Allergies, Isolation Precautions Code Status Info: Full Allergies Info: Clindamycin Hcl, Sulfamethoxazole-trimethoprim, Zanaflex (Tizanidine Hcl), Metronidazole, Statins, Trazodone And Nefazodone, Ace Inhibitors, Adhesive (Tape)     Isolation Precautions Info: COVID +     Current Medications (05/04/2019):  This is the current hospital active medication list Current Facility-Administered Medications  Medication Dose Route Frequency Provider Last Rate Last Admin  . amLODipine (NORVASC) tablet 5 mg  5 mg Oral Daily Wynetta Fines T, MD   5 mg at 05/04/19 0912  . cephALEXin (KEFLEX) capsule 500 mg  500 mg Oral Q12H Rodena Piety,  Noland Fordyce, MD   500 mg at 05/04/19 0912  . donepezil (ARICEPT) tablet 5 mg  5 mg Oral QHS Wynetta Fines T, MD   5 mg at 05/03/19 2157  . heparin injection 5,000 Units  5,000 Units Subcutaneous Q12H Wynetta Fines T, MD   5,000 Units at 05/04/19  0920  . hydrALAZINE (APRESOLINE) tablet 10 mg  10 mg Oral Q6H PRN Lequita Halt, MD   10 mg at 05/01/19 1632  . insulin aspart (novoLOG) injection 0-9 Units  0-9 Units Subcutaneous TID WC Lequita Halt, MD   1 Units at 05/04/19 1212  . latanoprost (XALATAN) 0.005 % ophthalmic solution 1 drop  1 drop Both Eyes QHS Lequita Halt, MD   1 drop at 05/03/19 2207  . levothyroxine (SYNTHROID) tablet 75 mcg  75 mcg Oral Q0600 Lequita Halt, MD   75 mcg at 05/04/19 0517  . losartan (COZAAR) tablet 100 mg  100 mg Oral Daily Wynetta Fines T, MD   100 mg at 05/04/19 0912  . metFORMIN (GLUCOPHAGE-XR) 24 hr tablet 500 mg  500 mg Oral BID WC Wynetta Fines T, MD   500 mg at 05/04/19 0912  . pravastatin (PRAVACHOL) tablet 40 mg  40 mg Oral q1800 Wynetta Fines T, MD   40 mg at 05/03/19 1839  . QUEtiapine (SEROQUEL) tablet 25 mg  25 mg Oral QHS Harold Hedge, MD         Discharge Medications: Please see discharge summary for a list of discharge medications.  Relevant Imaging Results:  Relevant Lab Results:   Additional Information SSN: [place], [place]

## 2019-05-04 NOTE — Progress Notes (Signed)
Bilateral lower extremity venous duplex completed. Refer to "CV Proc" under chart review to view preliminary results.  05/04/2019 12:17 PM Kelby Aline., MHA, RVT, RDCS, RDMS

## 2019-05-04 NOTE — TOC Initial Note (Signed)
Transition of Care [facility]) - Initial/Assessment Note    Patient Details  Name: Joy Ward MRN: [id number] Date of Birth: 04/09/1941  Transition of Care [facility]) CM/SW Contact:    Benard Halsted, LCSW Phone Number: 05/04/2019, 12:37 PM  Clinical Narrative:                 CSW received consult for possible SNF placement at time of discharge. CSW spoke with patient's granddaughter regarding PT recommendation of SNF placement at time of discharge. Jonelle Sidle reported that patient stays with her and she is currently unable to care for patient at home given patient's current physical needs and fall risk. Jonelle Sidle expressed understanding of PT recommendation and is agreeable to SNF placement at time of discharge. CSW discussed insurance authorization process and provided Medicare SNF ratings list. Jonelle Sidle also asked questions about Memory Care Facilities, which CSW emailed her a list of facilities. She reports she has applied for Medicaid for the patient. No further questions reported at this time. [facility] unable to accept patient due to fear of patient requiring a long term bed pending Medicaid. No other COVID beds available at this time. CSW to continue to follow and assist with discharge planning needs.   Expected Discharge Plan: Skilled Nursing Facility Barriers to Discharge: SNF Pending bed offer   Patient Goals and CMS Choice Patient states their goals for this hospitalization and ongoing recovery are:: Eventually goal is to get to Memory care CMS Medicare.gov Compare Post Acute Care list provided to:: Patient Represenative (must comment)(Granddaughter, Jonelle Sidle) Choice offered to / list presented to : (Granddaughter)  Expected Discharge Plan and Services Expected Discharge Plan: [place] In-house Referral: Clinical Social Work   Post Acute Care Choice: [place] Living arrangements for the past 2 months: [place]                                       Prior Living Arrangements/Services Living arrangements for the past 2 months: Single Family Home Lives with:: Relatives(Granddaughter) Patient language and need for interpreter reviewed:: Yes Do you feel safe going back to the place where you live?: Yes      Need for Family Participation in Patient Care: Yes (Comment) Care giver support system in place?: Yes (comment) Current home services: DME Criminal Activity/Legal Involvement Pertinent to Current Situation/Hospitalization: No - Comment as needed  Activities of Daily Living Home Assistive Devices/Equipment: Cane (specify quad or straight) ADL Screening (condition at time of admission) Patient's cognitive ability adequate to safely complete daily activities?: Yes Is the patient deaf or have difficulty hearing?: No Does the patient have difficulty seeing, even when wearing glasses/contacts?: No Does the patient have difficulty concentrating, remembering, or making decisions?: Yes Patient able to express need for assistance with ADLs?: Yes Does the patient have difficulty dressing or bathing?: No Independently performs ADLs?: Yes (appropriate for developmental age) Does the patient have difficulty walking or climbing stairs?: No Weakness of Legs: None Weakness of Arms/Hands: None  Permission Sought/Granted Permission sought to share information with : Facility Sport and exercise psychologist, Family Supports Permission granted to share information with : No  Share Information with NAME: Dario Guardian  Permission granted to share info w AGENCY: SNFs  Permission granted to share info w Relationship: Granddaughter  Permission granted to share info w Contact Information: [phone]  Emotional Assessment Appearance:: Appears stated age Attitude/Demeanor/Rapport: Unable to Assess Affect (typically observed): Unable  to Assess Orientation: : Oriented to Self Alcohol / Substance Use: Not Applicable Psych Involvement: No  (comment)  Admission diagnosis:  UTI (urinary tract infection) [N39.0] Acute cystitis without hematuria [N30.00] Altered mental status, unspecified altered mental status type [R41.82] Dementia without behavioral disturbance, unspecified dementia type ([place]) [F03.90] Patient Active Problem List   Diagnosis Date Noted  . Altered mental status   . UTI (urinary tract infection) 04/30/2019  . Dementia without behavioral disturbance ([place])   . Vascular dementia with behavior disturbance ([place]) 04/14/2019  . Primary insomnia 04/14/2019  . Hypothyroidism 04/14/2019  . Diabetes mellitus ([place]) 04/14/2019  . Memory loss or impairment 03/29/2019  . Diabetic peripheral neuropathy associated with type 2 diabetes mellitus ([place]) 12/29/2017  . Peripheral edema 12/29/2017  . Elevated liver enzymes 10/15/2016  . Chest pain 04/22/2016  . Systolic murmur [id number]  . Hypercalcemia 04/22/2016  . Hyperlipidemia 04/22/2016  . Baker's cyst of knee, right 04/21/2016  . HYPOKALEMIA 01/24/2010  . CONSTIPATION 12/23/2009  . INTERMITTENT VERTIGO 12/23/2009  . SINUSITIS, ACUTE 04/25/2009  . VAGINITIS, CANDIDAL 02/14/2009  . HIP PAIN, RIGHT 02/14/2009  . HYPERLIPIDEMIA, MIXED 01/01/2009  . BACK PAIN, LUMBAR, WITH RADICULOPATHY 12/25/2008  . KNEE PAIN, BILATERAL 12/18/2008  . TROCHANTERIC BURSITIS, BILATERAL 12/18/2008  . Disturbance in sleep behavior 12/18/2008  . ONYCHOMYCOSIS, TOENAILS 10/30/2008  . ENTERITIS, CLOSTRIDIUM DIFFICILE 10/15/2006  . Diabetes type 2, uncontrolled ([place]) 09/18/2006  . Essential hypertension 09/18/2006  . Allergic rhinitis 09/18/2006  . GOITER, MULTINODULAR 06/29/2006  . STROKE 04/27/2001   PCP:  Sandrea Hughs, NP Pharmacy:   [facility] [place], [place] [address] Phone: [phone] Fax: [phone]  [facility] [place], [place] - [place] [address] [place] [address] Phone: [phone] Fax: [phone]  [facility] - [address] [place] [address] Phone: [phone] Fax: [phone]  [facility] [place], [place] [address] Phone: [phone] Fax: [phone]  [facility] [place], [place] [address] Phone: [phone] Fax: [phone]  [facility] [place], [place] [address] [place] [address] Phone: [phone] Fax: [phone]     Social Determinants of Health (SDOH) Interventions    Readmission Risk Interventions No flowsheet data found.

## 2019-05-05 LAB — CBC WITH DIFFERENTIAL/PLATELET
Abs Immature Granulocytes: 0.07 10*3/uL (ref 0.00–0.07)
Basophils Absolute: 0 10*3/uL (ref 0.0–0.1)
Basophils Relative: 1 %
Eosinophils Absolute: 0.1 10*3/uL (ref 0.0–0.5)
Eosinophils Relative: 3 %
HCT: 43.1 % (ref 36.0–46.0)
Hemoglobin: 14.4 g/dL (ref 12.0–15.0)
Immature Granulocytes: 2 %
Lymphocytes Relative: 38 %
Lymphs Abs: 1.7 10*3/uL (ref 0.7–4.0)
MCH: 29.8 pg (ref 26.0–34.0)
MCHC: 33.4 g/dL (ref 30.0–36.0)
MCV: 89 fL (ref 80.0–100.0)
Monocytes Absolute: 0.3 10*3/uL (ref 0.1–1.0)
Monocytes Relative: 6 %
Neutro Abs: 2.4 10*3/uL (ref 1.7–7.7)
Neutrophils Relative %: 50 %
Platelets: 292 10*3/uL (ref 150–400)
RBC: 4.84 MIL/uL (ref 3.87–5.11)
RDW: 12 % (ref 11.5–15.5)
WBC: 4.6 10*3/uL (ref 4.0–10.5)
nRBC: 0 % (ref 0.0–0.2)

## 2019-05-05 LAB — CULTURE, BLOOD (ROUTINE X 2)
Culture: NO GROWTH
Culture: NO GROWTH

## 2019-05-05 LAB — GLUCOSE, CAPILLARY
Glucose-Capillary: 118 mg/dL — ABNORMAL HIGH (ref 70–99)
Glucose-Capillary: 120 mg/dL — ABNORMAL HIGH (ref 70–99)
Glucose-Capillary: 150 mg/dL — ABNORMAL HIGH (ref 70–99)
Glucose-Capillary: 163 mg/dL — ABNORMAL HIGH (ref 70–99)

## 2019-05-05 LAB — PHOSPHORUS: Phosphorus: 2.9 mg/dL (ref 2.5–4.6)

## 2019-05-05 LAB — C-REACTIVE PROTEIN: CRP: 0.6 mg/dL (ref <1.0)

## 2019-05-05 LAB — COMPREHENSIVE METABOLIC PANEL
ALT: 20 U/L (ref 0–44)
AST: 25 U/L (ref 15–41)
Albumin: 3.2 g/dL — ABNORMAL LOW (ref 3.5–5.0)
Alkaline Phosphatase: 58 U/L (ref 38–126)
Anion gap: 15 (ref 5–15)
BUN: 11 mg/dL (ref 8–23)
CO2: 22 mmol/L (ref 22–32)
Calcium: 9.6 mg/dL (ref 8.9–10.3)
Chloride: 101 mmol/L (ref 98–111)
Creatinine, Ser: 0.63 mg/dL (ref 0.44–1.00)
GFR calc Af Amer: >60 mL/min (ref >60)
GFR calc non Af Amer: >60 mL/min (ref >60)
Glucose, Bld: 127 mg/dL — ABNORMAL HIGH (ref 70–99)
Potassium: 4 mmol/L (ref 3.5–5.1)
Sodium: 138 mmol/L (ref 135–145)
Total Bilirubin: 0.3 mg/dL (ref 0.3–1.2)
Total Protein: 6.8 g/dL (ref 6.5–8.1)

## 2019-05-05 LAB — FERRITIN: Ferritin: 86 ng/mL (ref 11–307)

## 2019-05-05 LAB — MAGNESIUM: Magnesium: 1.8 mg/dL (ref 1.7–2.4)

## 2019-05-05 LAB — D-DIMER, QUANTITATIVE: D-Dimer, Quant: 2.28 ug/mL-FEU — ABNORMAL HIGH (ref 0.00–0.50)

## 2019-05-05 NOTE — Progress Notes (Signed)
PROGRESS NOTE    Joy Ward    Code Status: Full Code  [id number] DOB: 1940/07/29 DOA: 04/30/2019  PCP: Sandrea Hughs, NP    Hospital Summary  This is a 79 year old female with history of advanced dementia, diabetes, hypertension, hypothyroidism who presented to the hospital with increased agitation and confusion.  Patient's caregiver is granddaughter.  Found to have UTI on admission and started on antibiotics.  Also found to be COVID-19 positive but has remained hemodynamically stable on room air and has not required any COVID-19 treatment.  Currently pending SNF placement  A & P   Active Problems:   UTI (urinary tract infection)   1. E. coli UTI a. Ceftriaxone-> Ancef, completed 5 days treatment 1/7 2. Altered mental status/metabolic encephalopathy likely secondary to E. coli in setting of dementia and possible polypharmacy a. resolved b. Discontinued Haldol and hydrocodone c. Decreased seroquel to 25 mg QHS, from bid on 1/7.  Mentation much better this a.m. continue decreased dose 3. Dementia a. Continue Aricept b. Seroquel as above 4. Type 2 diabetes On Metformin 5. Hyperlipidemia on statin 6. Debility a. PT/OT recommending SNF, SW consulted b. Hopeful DC tomorrow  7. Elevated D-dimer a. Initially 7.1, improved b. Bilateral lower extremity Doppler negative for DVT 1/7 8. Hypertension a. Continue Norvasc and Cozaar 9. COVID-19 a. Reportedly tested positive prior to Christmas, no documentation on record, family supposed to bring in documentation b. Has had minor productive cough, resolved today c. Continuous pulse ox 10. Glaucoma on eyedrops 11. Hypothyroidism on Synthroid 12. Hypokalemia and hypomagnesemia resolved  DVT prophylaxis: Heparin Family Communication: Discussed with daughter today.  Stated that patient's positive Covid test was sent to the patient's phone.  We will try to somehow get that into the EMR and have her transferred out of the Covid unit  if possible Disposition Plan: Barrier to discharge is SNF placement.  Hopeful discharge tomorrow  Consultants  None  Procedures  None  Antibiotics   Anti-infectives (From admission, onward)   Start     Dose/Rate Route Frequency Ordered Stop   05/02/19 1330  cephALEXin (KEFLEX) capsule 500 mg     500 mg Oral Every 12 hours 05/02/19 1238 05/07/19 0959   05/01/19 1500  cefTRIAXone (ROCEPHIN) 1 g in sodium chloride 0.9 % 100 mL IVPB  Status:  Discontinued     1 g 200 mL/hr over 30 Minutes Intravenous Every 24 hours 04/30/19 1524 05/02/19 1238   04/30/19 1445  cefTRIAXone (ROCEPHIN) 1 g in sodium chloride 0.9 % 100 mL IVPB     1 g 200 mL/hr over 30 Minutes Intravenous  Once 04/30/19 1437 04/30/19 1555           Subjective   States she feels much better today and has no complaints of fatigue, malaise, nausea, vomiting, diarrhea.  Denies any chest pain or shortness of breath.  No other complaints and no overnight events.  Objective   Vitals:   05/04/19 2202 05/05/19 0548 05/05/19 1223 05/05/19 1300  BP: 121/71 128/75 139/83 (!) 143/85  Pulse: 81 75    Resp: 16 16    Temp: 97.7 F (36.5 C) 98.5 F (36.9 C)  98.8 F (37.1 C)  TempSrc: Oral Oral    SpO2: 99% 100%    Weight:      Height:        Intake/Output Summary (Last 24 hours) at 05/05/2019 1531 Last data filed at 05/05/2019 0600 Gross per 24 hour  Intake 440 ml  Output  300 ml  Net 140 ml   Filed Weights   04/30/19 1235  Weight: 61.2 kg    Examination:  Physical Exam Vitals and nursing note reviewed.  Constitutional:      Appearance: Normal appearance.  HENT:     Head: Normocephalic and atraumatic.     Nose:     Comments: Mask on Eyes:     Extraocular Movements: Extraocular movements intact.  Cardiovascular:     Rate and Rhythm: Normal rate.     Pulses: Normal pulses.  Pulmonary:     Effort: Pulmonary effort is normal. No respiratory distress.  Abdominal:     General: Abdomen is flat. There is no  distension.  Musculoskeletal:        General: No swelling or tenderness.     Cervical back: Normal range of motion. No rigidity.  Neurological:     Mental Status: She is alert. Mental status is at baseline.  Psychiatric:        Mood and Affect: Mood normal.        Behavior: Behavior normal.     Data Reviewed: I have personally reviewed following labs and imaging studies  CBC: Recent Labs  Lab 05/01/19 0500 05/02/19 0439 05/03/19 0845 05/04/19 0500 05/05/19 0500  WBC 5.0 6.5 4.9 3.7* 4.6  NEUTROABS 3.5 3.8 2.8 1.7 2.4  HGB 13.0 13.9 13.9 13.4 14.4  HCT 37.8 41.2 41.2 40.2 43.1  MCV 86.5 88.2 88.4 89.1 89.0  PLT 195 222 223 281 [id number]   Basic Metabolic Panel: Recent Labs  Lab 05/01/19 0500 05/02/19 0439 05/03/19 0845 05/04/19 0500 05/05/19 0500  NA 139 139 137 139 138  K 2.9* 4.4 4.2 5.2* 4.0  CL 100 102 101 101 101  CO2 26 24 25  32 22  GLUCOSE 131* 144* 136* 140* 127*  BUN 7* 7* 8 7* 11  CREATININE 0.53 0.54 0.69 0.87 0.63  CALCIUM 9.1 9.3 9.2 9.7 9.6  MG 1.7 2.3 2.0 2.0 1.8  PHOS 2.6 2.5 2.8 3.6 2.9   GFR: Estimated Creatinine Clearance: 53 mL/min (by C-G formula based on SCr of 0.63 mg/dL). Liver Function Tests: Recent Labs  Lab 05/01/19 0500 05/02/19 0439 05/03/19 0845 05/04/19 0500 05/05/19 0500  AST 32 27 26 29 25   ALT 20 20 18 21 20   ALKPHOS 67 71 65 60 58  BILITOT 0.6 0.6 0.5 0.5 0.3  PROT 6.7 7.1 6.7 6.2* 6.8  ALBUMIN 2.9* 3.0* 2.9* 2.7* 3.2*   No results for input(s): LIPASE, AMYLASE in the last 168 hours. No results for input(s): AMMONIA in the last 168 hours. Coagulation Profile: No results for input(s): INR, PROTIME in the last 168 hours. Cardiac Enzymes: No results for input(s): CKTOTAL, CKMB, CKMBINDEX, TROPONINI in the last 168 hours. BNP (last 3 results) No results for input(s): PROBNP in the last 8760 hours. HbA1C: No results for input(s): HGBA1C in the last 72 hours. CBG: Recent Labs  Lab 05/04/19 1150 05/04/19 1608  05/04/19 2159 05/05/19 0748 05/05/19 1221  GLUCAP 128* 135* 115* 118* 120*   Lipid Profile: No results for input(s): CHOL, HDL, LDLCALC, TRIG, CHOLHDL, LDLDIRECT in the last 72 hours. Thyroid Function Tests: No results for input(s): TSH, T4TOTAL, FREET4, T3FREE, THYROIDAB in the last 72 hours. Anemia Panel: Recent Labs    05/04/19 0500 05/05/19 0500  FERRITIN 77 86   Sepsis Labs: Recent Labs  Lab 04/30/19 1505  LATICACIDVEN 1.7    Recent Results (from the past 240 hour(s))  Respiratory Panel by  RT PCR (Flu A&B, Covid) - Nasopharyngeal Swab     Status: Abnormal   Collection Time: 04/30/19  2:37 PM   Specimen: Nasopharyngeal Swab  Result Value Ref Range Status   SARS Coronavirus 2 by RT PCR POSITIVE (A) NEGATIVE Final    Comment: RESULT CALLED TO, READ BACK BY AND VERIFIED WITH: rn j blue [id number] (NOTE) SARS-CoV-2 target nucleic acids are DETECTED. SARS-CoV-2 RNA is generally detectable in upper respiratory specimens  during the acute phase of infection. Positive results are indicative of the presence of the identified virus, but do not rule out bacterial infection or co-infection with other pathogens not detected by the test. Clinical correlation with patient history and other diagnostic information is necessary to determine patient infection status. The expected result is Negative. Fact Sheet for Patients:  PinkCheek.be Fact Sheet for Healthcare Providers: GravelBags.it This test is not yet approved or cleared by the [place] FDA and  has been authorized for detection and/or diagnosis of SARS-CoV-2 by FDA under an Emergency Use Authorization (EUA).  This EUA will remain in effect (meaning this test can be used) for the dura tion of  the COVID-19 declaration under Section 564(b)(1) of the Act, 21 U.S.C. section 360bbb-3(b)(1), unless the authorization is terminated or revoked sooner.    Influenza  A by PCR NEGATIVE NEGATIVE Final   Influenza B by PCR NEGATIVE NEGATIVE Final    Comment: (NOTE) The Xpert Xpress SARS-CoV-2/FLU/RSV assay is intended as an aid in  the diagnosis of influenza from Nasopharyngeal swab specimens and  should not be used as a sole basis for treatment. Nasal washings and  aspirates are unacceptable for Xpert Xpress SARS-CoV-2/FLU/RSV  testing. Fact Sheet for Patients: PinkCheek.be Fact Sheet for Healthcare Providers: GravelBags.it This test is not yet approved or cleared by the [place] FDA and  has been authorized for detection and/or diagnosis of SARS-CoV-2 by  FDA under an Emergency Use Authorization (EUA). This EUA will remain  in effect (meaning this test can be used) for the duration of the  Covid-19 declaration under Section 564(b)(1) of the Act, 21  U.S.C. section 360bbb-3(b)(1), unless the authorization is  terminated or revoked. Performed at [facility], [place] [address] [place], [place] [address]   Urine culture     Status: Abnormal   Collection Time: 04/30/19  2:47 PM   Specimen: Urine, Random  Result Value Ref Range Status   Specimen Description URINE, RANDOM  Final   Special Requests NONE  Final   Culture (A)  Final    >=100,000 COLONIES/mL ESCHERICHIA COLI Two isolates with different morphologies were identified as the same organism.The most resistant organism was reported. Performed at [facility], [place] [address], [place], [place] [address]    Report Status 05/02/2019 FINAL  Final   Organism ID, Bacteria ESCHERICHIA COLI (A)  Final      Susceptibility   Escherichia coli - MIC*    AMPICILLIN <=2 SENSITIVE Sensitive     CEFAZOLIN <=4 SENSITIVE Sensitive     CEFTRIAXONE <=0.25 SENSITIVE Sensitive     CIPROFLOXACIN <=0.25 SENSITIVE Sensitive     GENTAMICIN <=1 SENSITIVE Sensitive     IMIPENEM <=0.25 SENSITIVE Sensitive     NITROFURANTOIN <=16 SENSITIVE  Sensitive     TRIMETH/SULFA <=20 SENSITIVE Sensitive     AMPICILLIN/SULBACTAM <=2 SENSITIVE Sensitive     PIP/TAZO <=4 SENSITIVE Sensitive     * >=100,000 COLONIES/mL ESCHERICHIA COLI  Blood culture (routine x 2)  Status: None   Collection Time: 04/30/19  3:00 PM   Specimen: Right Antecubital; Blood  Result Value Ref Range Status   Specimen Description RIGHT ANTECUBITAL  Final   Special Requests   Final    BOTTLES DRAWN AEROBIC AND ANAEROBIC Blood Culture results may not be optimal due to an inadequate volume of blood received in culture bottles   Culture   Final    NO GROWTH 5 DAYS Performed at [facility], [place] [address] [place], [place] [address]    Report Status 05/05/2019 FINAL  Final  Blood culture (routine x 2)     Status: None   Collection Time: 04/30/19  3:05 PM   Specimen: Left Antecubital; Blood  Result Value Ref Range Status   Specimen Description LEFT ANTECUBITAL  Final   Special Requests   Final    BOTTLES DRAWN AEROBIC AND ANAEROBIC Blood Culture results may not be optimal due to an inadequate volume of blood received in culture bottles   Culture   Final    NO GROWTH 5 DAYS Performed at [facility], [place] [address], [place],  [address]    Report Status 05/05/2019 FINAL  Final         Radiology Studies: VAS [place] LOWER EXTREMITY VENOUS (DVT)  Result Date: 05/04/2019  Lower Venous Study Indications: Pain, and Swelling.  Comparison Study: No prior study. Performing Technologist: Maudry Mayhew MHA, RDMS, RVT, RDCS  Examination Guidelines: A complete evaluation includes B-mode imaging, spectral Doppler, color Doppler, and power Doppler as needed of all accessible portions of each vessel. Bilateral testing is considered an integral part of a complete examination. Limited examinations for reoccurring indications may be performed as noted.  +---------+---------------+---------+-----------+----------+--------------+ RIGHT     CompressibilityPhasicitySpontaneityPropertiesThrombus Aging +---------+---------------+---------+-----------+----------+--------------+ CFV      Full           Yes      Yes                                 +---------+---------------+---------+-----------+----------+--------------+ SFJ      Full                                                        +---------+---------------+---------+-----------+----------+--------------+ FV Prox  Full                                                        +---------+---------------+---------+-----------+----------+--------------+ FV Mid   Full                                                        +---------+---------------+---------+-----------+----------+--------------+ FV DistalFull                                                        +---------+---------------+---------+-----------+----------+--------------+  PFV      Full                                                        +---------+---------------+---------+-----------+----------+--------------+ POP      Full           Yes      Yes                                 +---------+---------------+---------+-----------+----------+--------------+ PTV      Full                                                        +---------+---------------+---------+-----------+----------+--------------+ PERO     Full                                                        +---------+---------------+---------+-----------+----------+--------------+   +---------+---------------+---------+-----------+----------+--------------+ LEFT     CompressibilityPhasicitySpontaneityPropertiesThrombus Aging +---------+---------------+---------+-----------+----------+--------------+ CFV      Full           Yes      Yes                                 +---------+---------------+---------+-----------+----------+--------------+ SFJ      Full                                                         +---------+---------------+---------+-----------+----------+--------------+ FV Prox  Full                                                        +---------+---------------+---------+-----------+----------+--------------+ FV Mid   Full                                                        +---------+---------------+---------+-----------+----------+--------------+ FV DistalFull                                                        +---------+---------------+---------+-----------+----------+--------------+ PFV      Full                                                        +---------+---------------+---------+-----------+----------+--------------+  POP      Full           Yes      Yes                                 +---------+---------------+---------+-----------+----------+--------------+ PTV      Full                                                        +---------+---------------+---------+-----------+----------+--------------+ PERO     Full                                                        +---------+---------------+---------+-----------+----------+--------------+  Summary: Right: There is no evidence of deep vein thrombosis in the lower extremity. No cystic structure found in the popliteal fossa. Left: There is no evidence of deep vein thrombosis in the lower extremity. No cystic structure found in the popliteal fossa.  *See table(s) above for measurements and observations. Electronically signed by Monica Martinez MD on 05/04/2019 at 5:11:00 PM.    Final         Scheduled Meds: . amLODipine  5 mg Oral Daily  . cephALEXin  500 mg Oral Q12H  . donepezil  5 mg Oral QHS  . enoxaparin (LOVENOX) injection  40 mg Subcutaneous Q24H  . insulin aspart  0-9 Units Subcutaneous TID WC  . latanoprost  1 drop Both Eyes QHS  . levothyroxine  75 mcg Oral Q0600  . losartan  100 mg Oral Daily  . metFORMIN  500 mg Oral BID WC  . pravastatin   40 mg Oral q1800  . QUEtiapine  25 mg Oral QHS   Continuous Infusions:   LOS: 5 days    Time spent: 51minutes with over 50% of the time coordinating the patient's care    Harold Hedge, DO [facility] Pager [phone]  If 7PM-7AM, please contact night-coverage [email or web address] Password [facility] 05/05/2019, 3:31 PM

## 2019-05-05 NOTE — TOC Progression Note (Addendum)
Transition of Care [facility]) - Progression Note    Patient Details  Name: Joy Ward MRN: [id number] Date of Birth: 09/06/1940  Transition [facility]) CM/SW [place], LCSW Phone Number: 05/05/2019, 12:55 PM  Clinical Narrative:    9am- SNF able to accept patient today. CSW updated patient's granddaughter.  12:50pm-Per Gerald Stabs at [facility], they will now not be able to accept the patient until tomorrow due to one of their residents testing positive and needing the COVID bed. MD and patient's granddaughter aware.    Expected Discharge Plan: Skilled Nursing Facility Barriers to Discharge: SNF Pending bed offer  Expected Discharge Plan and Services Expected Discharge Plan: [place] In-house Referral: Clinical Social Work   Post Acute Care Choice: [place] Living arrangements for the past 2 months: Single Family Home                                       Social Determinants of Health (SDOH) Interventions    Readmission Risk Interventions No flowsheet data found.

## 2019-05-05 NOTE — Progress Notes (Signed)
Occupational Therapy Treatment Patient Details Name: Joy Ward MRN: [id number] DOB: Nov 08, 1940 Today's Date: 05/05/2019    History of present illness 79 year old female with history of high cholesterol, diabetes, dementia who presents to the ED 04/30/19 with increasing confusion, intermittent hallucinations. Pt's caregiver is her granddaughter, Jonelle Sidle who states she can no longer take care of patient given her worsening mentation and hallucinations.  UTI was found and pt was start on ABX.    OT comments  Pt seen for OT f/u session, initially found wandering hallway looking for vending machine to buy a coke. When attempting to redirect pt, she became mildly agitated as to why OT needed to accompany her and that she could not find her money. Pt completed functional mobility in hallway to refrigerator to find coke, needing max safety cues and trail making cues to navigate environment safely and find way back to room. Once back to room pt sitting on side of bed with bed alarm. RN notified of pt wandering. D/c recs remain appropriate. Will continue to follow.   Follow Up Recommendations  SNF;Supervision/Assistance - 24 hour    Equipment Recommendations  None recommended by OT    Recommendations for Other Services      Precautions / Restrictions Precautions Precautions: Fall Precaution Comments: 2x falls in ED; found wandering in hall 1/8 Restrictions Weight Bearing Restrictions: No       Mobility Bed Mobility               General bed mobility comments: sitting EOB, initially found wandering hall  Transfers Overall transfer level: Needs assistance Equipment used: None Transfers: Sit to/from Stand Sit to Stand: Min guard         General transfer comment: min guard for safety    Balance Overall balance assessment: Needs assistance Sitting-balance support: Feet supported;No upper extremity supported Sitting balance-Leahy Scale: Fair     Standing balance support: No  upper extremity supported;During functional activity Standing balance-Leahy Scale: Fair                             ADL either performed or assessed with clinical judgement   ADL                                         General ADL Comments: session focused on cognitive compensatory strategies and functional mobility to promote improved activity tolerance     Vision Patient Visual Report: No change from baseline     Perception     Praxis      Cognition Arousal/Alertness: Awake/alert Behavior During Therapy: WFL for tasks assessed/performed Overall Cognitive Status: History of cognitive impairments - at baseline                                 General Comments: history of dementia, found wandering in hallway this date. became mildly agitated as to why OT was approaching her. Asking OT to show her the vending machines so she can buy a coke because "she just moved here"        Exercises     Shoulder Instructions       General Comments      Pertinent Vitals/ Pain       Pain Assessment: Faces Faces Pain Scale: Hurts a little bit Pain Location:  legs Pain Descriptors / Indicators: Aching Pain Intervention(s): Monitored during session  Home Living                                          Prior Functioning/Environment              Frequency  Min 2X/week        Progress Toward Goals  OT Goals(current goals can now be found in the care plan section)  Progress towards OT goals: Progressing toward goals  Acute Rehab OT Goals Patient Stated Goal: to get out of hospital  OT Goal Formulation: With patient Time For Goal Achievement: 05/17/19 Potential to Achieve Goals: Good  Plan Discharge plan remains appropriate    Co-evaluation                 AM-PAC OT "6 Clicks" Daily Activity     Outcome Measure   Help from another person eating meals?: A Little Help from another person taking care  of personal grooming?: A Little Help from another person toileting, which includes using toliet, bedpan, or urinal?: A Little Help from another person bathing (including washing, rinsing, drying)?: A Little Help from another person to put on and taking off regular upper body clothing?: A Little Help from another person to put on and taking off regular lower body clothing?: A Little 6 Click Score: 18    End of Session Equipment Utilized During Treatment: Gait belt  OT Visit Diagnosis: Unsteadiness on feet (R26.81);Other abnormalities of gait and mobility (R26.89);Muscle weakness (generalized) (M62.81);Other symptoms and signs involving cognitive function;Pain Pain - Right/Left: (B) Pain - part of body: Leg   Activity Tolerance Patient tolerated treatment well   Patient Left in bed;with call bell/phone within reach;with bed alarm set   Nurse Communication Mobility status;Patient requests pain meds;Other (comment)(RN notified of pt wandering)        Time: [id number] OT Time Calculation (min): 10 min  Charges: OT General Charges $OT Visit: 1 Visit OT Treatments $Self Care/Home Management : 8-22 mins  Zenovia Jarred, MSOT, OTR/L Acute Rehabilitation Services [facility] Office Number: [phone]  Zenovia Jarred 05/05/2019, 5:55 PM

## 2019-05-05 NOTE — Progress Notes (Signed)
Copy and pasted positive Covid Test patient had as an outpatient.  Collection date is 04/19/2019.  Collection Date Jozlynn, Schwehr Birth Accession # [id number] Marjo Bicker 19-Jan-1941 [id number] 04/19/2019 2:15 PM Gender Female 04/20/2019 Name ID Received Date Reported Date 04/21/2019 Doctor Organization [facility] Patient Matrix - Nasopharyngeal Swab Provider [facility] revealTM Pathogen Detected Status - FINAL COPY Test system details The SARS-CoV-2 (COVID-19) RT-PCR test was developed and its performance characteristics determined by [facility]. This test has not been FDA cleared or approved. This test was submitted to [facility] for review. This test has been validated in accordance with the [facility] Validation procedure for SARS-CoV-2 for laboratory developed tests. This test is only authorized for the duration of time the declaration that circumstances exist justifying the authorization of the emergency use of In vitro diagnostic tests for detection of SARS-CoV-2 virus and/or diagnosis of COVID-19 infection under Executive order 202.10., unless the authorization is terminated or revoked sooner. [facility]; CLIA ID # [id number], [address] [place] [address] Laboratory Director: Thornell Mule, PhD, [facility] Viral (RIT) Test Name Outcome SARS-CoV-2 (COVID-19) Detected Printed: 04/21/2019 7:18 PM Page 1 of 1 Accession: [id number] Patient ID: [id number] Lab Results For: Abran Duke Originally Reported On: 04/21/2019 6:54 PM STAT[S] Corrected [C] Added [A]

## 2019-05-06 DIAGNOSIS — F0280 Dementia in other diseases classified elsewhere without behavioral disturbance: Secondary | ICD-10-CM

## 2019-05-06 DIAGNOSIS — N390 Urinary tract infection, site not specified: Principal | ICD-10-CM

## 2019-05-06 DIAGNOSIS — R410 Disorientation, unspecified: Secondary | ICD-10-CM

## 2019-05-06 DIAGNOSIS — G309 Alzheimer's disease, unspecified: Secondary | ICD-10-CM

## 2019-05-06 LAB — GLUCOSE, CAPILLARY
Glucose-Capillary: 157 mg/dL — ABNORMAL HIGH (ref 70–99)
Glucose-Capillary: 80 mg/dL (ref 70–99)
Glucose-Capillary: 158 mg/dL — ABNORMAL HIGH (ref 70–99)
Glucose-Capillary: 99 mg/dL (ref 70–99)

## 2019-05-06 NOTE — TOC Progression Note (Signed)
Transition of Care [facility]) - Progression Note    Patient Details  Name: Yaitza Lor MRN: [id number] Date of Birth: [birth year]  Transition of Care [facility]) CM/SW [place], [place] Phone Number: [phone] 05/06/2019, 1:05 PM  Clinical Narrative:     CSW spoke to Benjamin Perez at [facility] he reports he will have bed avail for patient tomorrow. CSW has informed MD.   Expected Discharge Plan: [place] Barriers to Discharge: SNF Pending bed offer  Expected Discharge Plan and Services Expected Discharge Plan: [place] In-house Referral: Clinical Social Work   Post Acute Care Choice: [place] Living arrangements for the past 2 months: Single Family Home                                       Social Determinants of Health (SDOH) Interventions    Readmission Risk Interventions No flowsheet data found.

## 2019-05-06 NOTE — Discharge Summary (Signed)
Physician Discharge Summary  [name] [id number] DOB: 12/26/1940 DOA: 04/30/2019  PCP: Sandrea Hughs, NP  Admit date: 04/30/2019 Discharge date: 05/07/2019  Admitted From: ** Disposition:  SNF- [facility]  Recommendations for Outpatient Follow-up:  1. Follow up with PCP in 1-2 weeks 2. Please obtain BMP/CBC in one week 3. Please follow up on the following pending results:  Home Health:NA  Equipment/Devices:NA  Discharge Condition:Stable  CODE STATUS:FULL   Diet recommendation: Heart Healthy / Carb Modified / Regular / Dysphagia   Brief/Interim Summary:  Acute metabolic encephalopathy-superimposed on underlying dementia patient presented with encephalopathy, likely multifactorial. See treatment of UTI below. At time of discharge, oriented to x 3 (self, place and time)--however patient has short-term memory deficits. Continued on donepezil. slowly improving.  She was started on Seroquel while admitted, recommend decreasing and ultimately discontinuing. She is easily redirectable.   E. coli urinary tract infection, she will completed treatment on the morning of 1/10 prior to discharge.  Hypothyroidism, continue levothyroxine 75 mcg.  Recommend repeating TSH as an outpatient last TSH was slightly below goal.  Type 2 diabetes, continue Metformin  Dyslipidemia, continue atorvastatin  Hypertension, continue amlodipine.  History of Covid positive test.  Documentation received that test was completed on 24 December.  No symptoms at this time continue   Discharge Diagnoses:  Active Problems:   UTI (urinary tract infection)    Discharge Instructions - Repeat TSH as outpatient - Wean seroquel and ultimately discontinue   Discharge Instructions    Call MD for:  difficulty breathing, headache or visual disturbances   Complete by: As directed    Call MD for:  persistant dizziness or light-headedness   Complete by: As directed    Call MD for:  persistant  nausea and vomiting   Complete by: As directed    Call MD for:  redness, tenderness, or signs of infection (pain, swelling, redness, odor or green/yellow discharge around incision site)   Complete by: As directed    Call MD for:  temperature >100.4   Complete by: As directed    Diet - low sodium heart healthy   Complete by: As directed    Diet Carb Modified   Complete by: As directed    Discharge instructions   Complete by: As directed    1)insulin aspart (novoLOG) injection 0-10 Units 0-10 Units Subcutaneous, 3 times daily with meals CBG < 70: Implement Hypoglycemia Standing Orders and refer to Hypoglycemia Standing Orders sidebar report  CBG 70 - 120: 0 units  CBG 121 - 150: 0 unit CBG 151 - 200: 1 unit CBG 201 - 250: 2 units CBG 251 - 300: 4 units CBG 301 - 350: 6 units  CBG 351 - 400: 8 units CBG > 400: 10 units  2) repeat TSH in about 4 weeks as outpatient   Increase activity slowly   Complete by: As directed      Allergies as of 05/07/2019      Reactions   Clindamycin Hcl Other (See Comments)   REACTION: C. difficile colitis   Sulfamethoxazole-trimethoprim Other (See Comments)   Other reaction(s): Eye redness   Zanaflex [tizanidine Hcl] Anaphylaxis   Metronidazole Hives   Statins Other (See Comments)   Side Effect: muscle pain Has tried Zocor, Crestor--she does not want to take a statin, period   Trazodone And Nefazodone Other (See Comments)   Sinus pains   Ace Inhibitors Cough   Adhesive [tape] Itching, Rash, Other (See Comments)   Blistering of  skin      Medication List    STOP taking these medications   accu-chek softclix lancets   Accu-Chek Softclix Lancets lancets   Belsomra 5 MG Tabs Generic drug: Suvorexant   glucose blood test strip Commonly known as: Accu-Chek Aviva   Pitavastatin Calcium 4 MG Tabs Replaced by: pravastatin 20 MG tablet   Potassium Chloride ER 20 MEQ Tbcr     TAKE these medications   amLODipine 5 MG tablet Commonly known as:  NORVASC Take 1 tablet (5 mg total) by mouth daily.   bimatoprost 0.01 % Soln Commonly known as: LUMIGAN Place 1 drop into the left eye at bedtime.   diclofenac sodium 1 % Gel Commonly known as: VOLTAREN Apply 4 g topically 2 (two) times daily as needed (pain).   donepezil 5 MG tablet Commonly known as: ARICEPT Take 1 tablet (5 mg total) by mouth at bedtime.   Fish Oil 1000 MG Caps Take 1,000 mg by mouth daily.   furosemide 20 MG tablet Commonly known as: LASIX 1 tab by mouth in the morning daily if swelling of legs What changed:   how much to take  how to take this  when to take this  additional instructions   HYDROcodone-acetaminophen 10-325 MG tablet Commonly known as: NORCO Take 1 tablet by mouth every 6 (six) hours as needed for moderate pain or severe pain. What changed: reasons to take this   levothyroxine 75 MCG tablet Commonly known as: SYNTHROID Take 1 tablet (75 mcg total) by mouth daily before breakfast.   losartan 100 MG tablet Commonly known as: COZAAR Take 1 tablet (100 mg total) by mouth daily.   metFORMIN 500 MG 24 hr tablet Commonly known as: GLUCOPHAGE-XR 1 tab by mouth twice daily with meals What changed:   how much to take  how to take this  when to take this   multivitamin with minerals Tabs tablet Take 1 tablet by mouth daily.   NovoLOG FlexPen 100 UNIT/ML FlexPen Generic drug: insulin aspart insulin aspart (novoLOG) injection 0-10 Units 0-10 Units Subcutaneous, 3 times daily with meals CBG < 70: Implement Hypoglycemia Standing Orders and refer to Hypoglycemia Standing Orders sidebar report  CBG 70 - 120: 0 units  CBG 121 - 150: 0 unit CBG 151 - 200: 1 unit CBG 201 - 250: 2 units CBG 251 - 300: 4 units CBG 301 - 350: 6 units  CBG 351 - 400: 8 units CBG > 400: 10 units   pravastatin 20 MG tablet Commonly known as: PRAVACHOL Take 1 tablet (20 mg total) by mouth daily at 6 PM. Replaces: Pitavastatin Calcium 4 MG Tabs   QUEtiapine  25 MG tablet Commonly known as: SEROQUEL Take 1 tablet (25 mg total) by mouth at bedtime.      Follow-up Information    Ngetich, Dinah C, NP. Schedule an appointment as soon as possible for a visit in 1 day.   Specialty: Family Medicine Contact information: [place] [address] [phone]        [place].   Specialty: Emergency Medicine Why: As needed, If symptoms worsen Contact information: [id number] [place] [address] [phone]         Allergies  Allergen Reactions  . Clindamycin Hcl Other (See Comments)    REACTION: C. difficile colitis  . Sulfamethoxazole-Trimethoprim Other (See Comments)    Other reaction(s): Eye redness  . Zanaflex [Tizanidine Hcl] Anaphylaxis  . Metronidazole  Hives  . Statins Other (See Comments)    Side Effect: muscle pain Has tried Zocor, Crestor--she does not want to take a statin, period  . Trazodone And Nefazodone Other (See Comments)    Sinus pains  . Ace Inhibitors Cough  . Adhesive [Tape] Itching, Rash and Other (See Comments)    Blistering of skin     Consultations: na   Procedures/Studies: CT HEAD WO CONTRAST  Result Date: 05/01/2019 CLINICAL DATA:  Head trauma dementia EXAM: CT HEAD WITHOUT CONTRAST TECHNIQUE: Contiguous axial images were obtained from the base of the skull through the vertex without intravenous contrast. COMPARISON:  04/30/2019, 04/13/2019, 07/06/2016 FINDINGS: Brain: No acute territorial infarction, hemorrhage, or intracranial mass is visualized. Atrophy. Extensive hypodensity within the subcortical, periventricular and deep white matter consistent with chronic small vessel ischemic change. Chronic appearing lacunar infarcts in the basal ganglia. Stable ventricle size. Vascular: No hyperdense vessels.  Carotid vascular calcification Skull: Normal. Negative for fracture or focal lesion. Sinuses/Orbits: Mucous  retention cysts and mucosal thickening in the left maxillary sinus. Other: None IMPRESSION: 1. No CT evidence for acute intracranial abnormality. 2. Chronic small vessel ischemic change of the white matter. Atrophy. Electronically Signed   By: Donavan Foil M.D.   On: 05/01/2019 02:57   CT Head Wo Contrast  Result Date: 04/30/2019 CLINICAL DATA:  Altered mental status. Confusion. EXAM: CT HEAD WITHOUT CONTRAST TECHNIQUE: Contiguous axial images were obtained from the base of the skull through the vertex without intravenous contrast. COMPARISON:  07/06/2016 FINDINGS: Brain: No evidence of acute infarction, hemorrhage, hydrocephalus, extra-axial collection or mass lesion/mass effect. Increased ventricle volumes noted bilaterally. There is mild diffuse low-attenuation within the subcortical and periventricular white matter compatible with chronic microvascular disease. Vascular: No hyperdense vessel or unexpected calcification. Skull: Normal. Negative for fracture or focal lesion. Sinuses/Orbits: No acute finding. Other: None. IMPRESSION: 1. No acute intracranial abnormalities. 2. Chronic small vessel ischemic change and brain atrophy. Electronically Signed   By: Kerby Moors M.D.   On: 04/30/2019 14:43   CT Head Wo Contrast  Result Date: 04/13/2019 CLINICAL DATA:  Increasing combativeness. EXAM: CT HEAD WITHOUT CONTRAST TECHNIQUE: Contiguous axial images were obtained from the base of the skull through the vertex without intravenous contrast. COMPARISON:  July 05, 2016 FINDINGS: Brain: No evidence of acute infarction, hemorrhage, hydrocephalus, extra-axial collection or mass lesion/mass effect. Moderate atrophy and chronic microvascular ischemic changes are noted. Vascular: Old left-sided lacunar infarcts are noted. Skull: Normal. Negative for fracture or focal lesion. Sinuses/Orbits: No acute finding. Other: None. IMPRESSION: 1. No acute intracranial abnormality. 2. Atrophy and chronic microvascular  ischemic changes are again noted. Electronically Signed   By: Constance Holster M.D.   On: 04/13/2019 23:05   DG Chest Portable 1 View  Result Date: 04/30/2019 CLINICAL DATA:  Altered mental status, possible COVID EXAM: PORTABLE CHEST 1 VIEW COMPARISON:  04/13/2019 FINDINGS: The heart size and mediastinal contours are within normal limits. Both lungs are clear. The visualized skeletal structures are unremarkable. IMPRESSION: No acute abnormality of the lungs in AP portable projection. Electronically Signed   By: Eddie Candle M.D.   On: 04/30/2019 13:15   DG Chest Port 1 View  Result Date: 04/13/2019 CLINICAL DATA:  Altered mental status EXAM: PORTABLE CHEST 1 VIEW COMPARISON:  October 09, 2016. FINDINGS: The heart size and mediastinal contours are within normal limits. Both lungs are clear. The visualized skeletal structures are unremarkable. IMPRESSION: No active disease. Electronically Signed   By: Jamie Kato.D.  On: 04/13/2019 22:58   VAS [place] LOWER EXTREMITY VENOUS (DVT)  Result Date: 05/04/2019  Lower Venous Study Indications: Pain, and Swelling.  Comparison Study: No prior study. Performing Technologist: Maudry Mayhew MHA, RDMS, RVT, RDCS  Examination Guidelines: A complete evaluation includes B-mode imaging, spectral Doppler, color Doppler, and power Doppler as needed of all accessible portions of each vessel. Bilateral testing is considered an integral part of a complete examination. Limited examinations for reoccurring indications may be performed as noted.  +---------+---------------+---------+-----------+----------+--------------+ RIGHT    CompressibilityPhasicitySpontaneityPropertiesThrombus Aging +---------+---------------+---------+-----------+----------+--------------+ CFV      Full           Yes      Yes                                 +---------+---------------+---------+-----------+----------+--------------+ SFJ      Full                                                         +---------+---------------+---------+-----------+----------+--------------+ FV Prox  Full                                                        +---------+---------------+---------+-----------+----------+--------------+ FV Mid   Full                                                        +---------+---------------+---------+-----------+----------+--------------+ FV DistalFull                                                        +---------+---------------+---------+-----------+----------+--------------+ PFV      Full                                                        +---------+---------------+---------+-----------+----------+--------------+ POP      Full           Yes      Yes                                 +---------+---------------+---------+-----------+----------+--------------+ PTV      Full                                                        +---------+---------------+---------+-----------+----------+--------------+ PERO     Full                                                        +---------+---------------+---------+-----------+----------+--------------+   +---------+---------------+---------+-----------+----------+--------------+  LEFT     CompressibilityPhasicitySpontaneityPropertiesThrombus Aging +---------+---------------+---------+-----------+----------+--------------+ CFV      Full           Yes      Yes                                 +---------+---------------+---------+-----------+----------+--------------+ SFJ      Full                                                        +---------+---------------+---------+-----------+----------+--------------+ FV Prox  Full                                                        +---------+---------------+---------+-----------+----------+--------------+ FV Mid   Full                                                         +---------+---------------+---------+-----------+----------+--------------+ FV DistalFull                                                        +---------+---------------+---------+-----------+----------+--------------+ PFV      Full                                                        +---------+---------------+---------+-----------+----------+--------------+ POP      Full           Yes      Yes                                 +---------+---------------+---------+-----------+----------+--------------+ PTV      Full                                                        +---------+---------------+---------+-----------+----------+--------------+ PERO     Full                                                        +---------+---------------+---------+-----------+----------+--------------+  Summary: Right: There is no evidence of deep vein thrombosis in the lower extremity. No cystic structure found in the popliteal fossa. Left: There is no evidence of deep vein thrombosis in the lower extremity. No cystic structure found in the popliteal fossa.  *See table(s)  above for measurements and observations. Electronically signed by Monica Martinez MD on 05/04/2019 at 5:11:00 PM.    Final       Subjective:   Discharge Exam: Vitals:   05/06/19 2121 05/07/19 0607  BP: 125/85 118/81  Pulse: 78 82  Resp: 19 18  Temp: 98.8 F (37.1 C) 98.4 F (36.9 C)  SpO2: 100% 99%   Vitals:   05/06/19 0351 05/06/19 1424 05/06/19 2121 05/07/19 0607  BP: 117/71 123/79 125/85 118/81  Pulse: 88 94 78 82  Resp: 18  19 18   Temp: (!) 97.5 F (36.4 C) 99.1 F (37.3 C) 98.8 F (37.1 C) 98.4 F (36.9 C)  TempSrc: Oral  Oral Oral  SpO2: 100% 100% 100% 99%  Weight:      Height:        General: Pt is alert, awake, not in acute distress Cardiovascular: RRR, S1/S2 +, no rubs, no gallops Respiratory: CTA bilaterally, no wheezing, no rhonchi Abdominal: Soft, NT, ND, bowel sounds  + Extremities: no edema, no cyanosis Psych-At time of discharge, oriented to x 3 (self, place and time)--however patient has short-term memory deficits.    The results of significant diagnostics from this hospitalization (including imaging, microbiology, ancillary and laboratory) are listed below for reference.     Microbiology: Recent Results (from the past 240 hour(s))  Respiratory Panel by RT PCR (Flu A&B, Covid) - Nasopharyngeal Swab     Status: Abnormal   Collection Time: 04/30/19  2:37 PM   Specimen: Nasopharyngeal Swab  Result Value Ref Range Status   SARS Coronavirus 2 by RT PCR POSITIVE (A) NEGATIVE Final    Comment: RESULT CALLED TO, READ BACK BY AND VERIFIED WITH: rn j blue [id number] cj (NOTE) SARS-CoV-2 target nucleic acids are DETECTED. SARS-CoV-2 RNA is generally detectable in upper respiratory specimens  during the acute phase of infection. Positive results are indicative of the presence of the identified virus, but do not rule out bacterial infection or co-infection with other pathogens not detected by the test. Clinical correlation with patient history and other diagnostic information is necessary to determine patient infection status. The expected result is Negative. Fact Sheet for Patients:  PinkCheek.be Fact Sheet for Healthcare Providers: GravelBags.it This test is not yet approved or cleared by the [place] FDA and  has been authorized for detection and/or diagnosis of SARS-CoV-2 by FDA under an Emergency Use Authorization (EUA).  This EUA will remain in effect (meaning this test can be used) for the dura tion of  the COVID-19 declaration under Section 564(b)(1) of the Act, 21 U.S.C. section 360bbb-3(b)(1), unless the authorization is terminated or revoked sooner.    Influenza A by PCR NEGATIVE NEGATIVE Final   Influenza B by PCR NEGATIVE NEGATIVE Final    Comment: (NOTE) The Xpert Xpress  SARS-CoV-2/FLU/RSV assay is intended as an aid in  the diagnosis of influenza from Nasopharyngeal swab specimens and  should not be used as a sole basis for treatment. Nasal washings and  aspirates are unacceptable for Xpert Xpress SARS-CoV-2/FLU/RSV  testing. Fact Sheet for Patients: PinkCheek.be Fact Sheet for Healthcare Providers: GravelBags.it This test is not yet approved or cleared by the [place] FDA and  has been authorized for detection and/or diagnosis of SARS-CoV-2 by  FDA under an Emergency Use Authorization (EUA). This EUA will remain  in effect (meaning this test can be used) for the duration of the  Covid-19 declaration under Section 564(b)(1) of the Act, 21  U.S.C. section 360bbb-3(b)(1), unless  the authorization is  terminated or revoked. Performed at [facility], [place] [address] [place], [place] [address]   Urine culture     Status: Abnormal   Collection Time: 04/30/19  2:47 PM   Specimen: Urine, Random  Result Value Ref Range Status   Specimen Description URINE, RANDOM  Final   Special Requests NONE  Final   Culture (A)  Final    >=100,000 COLONIES/mL ESCHERICHIA COLI Two isolates with different morphologies were identified as the same organism.The most resistant organism was reported. Performed at [facility], [place] [address] [place], [place] [address]    Report Status 05/02/2019 FINAL  Final   Organism ID, Bacteria ESCHERICHIA COLI (A)  Final      Susceptibility   Escherichia coli - MIC*    AMPICILLIN <=2 SENSITIVE Sensitive     CEFAZOLIN <=4 SENSITIVE Sensitive     CEFTRIAXONE <=0.25 SENSITIVE Sensitive     CIPROFLOXACIN <=0.25 SENSITIVE Sensitive     GENTAMICIN <=1 SENSITIVE Sensitive     IMIPENEM <=0.25 SENSITIVE Sensitive     NITROFURANTOIN <=16 SENSITIVE Sensitive     TRIMETH/SULFA <=20 SENSITIVE Sensitive     AMPICILLIN/SULBACTAM <=2 SENSITIVE Sensitive     PIP/TAZO <=4  SENSITIVE Sensitive     * >=100,000 COLONIES/mL ESCHERICHIA COLI  Blood culture (routine x 2)     Status: None   Collection Time: 04/30/19  3:00 PM   Specimen: Right Antecubital; Blood  Result Value Ref Range Status   Specimen Description RIGHT ANTECUBITAL  Final   Special Requests   Final    BOTTLES DRAWN AEROBIC AND ANAEROBIC Blood Culture results may not be optimal due to an inadequate volume of blood received in culture bottles   Culture   Final    NO GROWTH 5 DAYS Performed at [facility] [address] [place], [place] [address]    Report Status 05/05/2019 FINAL  Final  Blood culture (routine x 2)     Status: None   Collection Time: 04/30/19  3:05 PM   Specimen: Left Antecubital; Blood  Result Value Ref Range Status   Specimen Description LEFT ANTECUBITAL  Final   Special Requests   Final    BOTTLES DRAWN AEROBIC AND ANAEROBIC Blood Culture results may not be optimal due to an inadequate volume of blood received in culture bottles   Culture   Final    NO GROWTH 5 DAYS Performed at [facility], [place] [address], [place], [place] [address]    Report Status 05/05/2019 FINAL  Final     Labs: BNP (last 3 results) No results for input(s): BNP in the last 8760 hours. Basic Metabolic Panel: Recent Labs  Lab 05/01/19 0500 05/02/19 0439 05/03/19 0845 05/04/19 0500 05/05/19 0500  NA 139 139 137 139 138  K 2.9* 4.4 4.2 5.2* 4.0  CL 100 102 101 101 101  CO2 26 24 25  32 22  GLUCOSE 131* 144* 136* 140* 127*  BUN 7* 7* 8 7* 11  CREATININE 0.53 0.54 0.69 0.87 0.63  CALCIUM 9.1 9.3 9.2 9.7 9.6  MG 1.7 2.3 2.0 2.0 1.8  PHOS 2.6 2.5 2.8 3.6 2.9   Liver Function Tests: Recent Labs  Lab 05/01/19 0500 05/02/19 0439 05/03/19 0845 05/04/19 0500 05/05/19 0500  AST 32 27 26 29 25   ALT 20 20 18 21 20   ALKPHOS 67 71 65 60 58  BILITOT 0.6 0.6 0.5 0.5 0.3  PROT 6.7 7.1 6.7  6.2* 6.8  ALBUMIN 2.9* 3.0* 2.9* 2.7* 3.2*   No results for input(s): LIPASE, AMYLASE in  the last 168 hours. No results for input(s): AMMONIA in the last 168 hours. CBC: Recent Labs  Lab 05/01/19 0500 05/02/19 0439 05/03/19 0845 05/04/19 0500 05/05/19 0500  WBC 5.0 6.5 4.9 3.7* 4.6  NEUTROABS 3.5 3.8 2.8 1.7 2.4  HGB 13.0 13.9 13.9 13.4 14.4  HCT 37.8 41.2 41.2 40.2 43.1  MCV 86.5 88.2 88.4 89.1 89.0  PLT 195 222 223 281 292   Cardiac Enzymes: No results for input(s): CKTOTAL, CKMB, CKMBINDEX, TROPONINI in the last 168 hours. BNP: Invalid input(s): POCBNP CBG: Recent Labs  Lab 05/06/19 0850 05/06/19 1234 05/06/19 1651 05/06/19 2119 05/07/19 0746  GLUCAP 157* 80 158* 99 225*   D-Dimer Recent Labs    05/05/19 0500  DDIMER 2.28*   Hgb A1c No results for input(s): HGBA1C in the last 72 hours. Lipid Profile No results for input(s): CHOL, HDL, LDLCALC, TRIG, CHOLHDL, LDLDIRECT in the last 72 hours. Thyroid function studies No results for input(s): TSH, T4TOTAL, T3FREE, THYROIDAB in the last 72 hours.  Invalid input(s): FREET3 Anemia work up Recent Labs    05/05/19 0500  FERRITIN 86   Urinalysis    Component Value Date/Time   COLORURINE YELLOW 04/30/2019 1342   APPEARANCEUR CLOUDY (A) 04/30/2019 1342   LABSPEC 1.023 04/30/2019 1342   PHURINE 5.0 04/30/2019 1342   GLUCOSEU NEGATIVE 04/30/2019 1342   HGBUR MODERATE (A) 04/30/2019 1342   HGBUR negative 02/14/2009 0938   BILIRUBINUR NEGATIVE 04/30/2019 1342   KETONESUR NEGATIVE 04/30/2019 1342   PROTEINUR 100 (A) 04/30/2019 1342   UROBILINOGEN 0.2 08/21/2009 1913   NITRITE POSITIVE (A) 04/30/2019 1342   LEUKOCYTESUR LARGE (A) 04/30/2019 1342   Sepsis Labs Invalid input(s): PROCALCITONIN,  WBC,  LACTICIDVEN Microbiology Recent Results (from the past 240 hour(s))  Respiratory Panel by RT PCR (Flu A&B, Covid) - Nasopharyngeal Swab     Status: Abnormal   Collection Time: 04/30/19  2:37 PM   Specimen: Nasopharyngeal Swab  Result Value Ref Range Status   SARS Coronavirus 2 by RT PCR POSITIVE (A)  NEGATIVE Final    Comment: RESULT CALLED TO, READ BACK BY AND VERIFIED WITH: rn j blue [id number] cj (NOTE) SARS-CoV-2 target nucleic acids are DETECTED. SARS-CoV-2 RNA is generally detectable in upper respiratory specimens  during the acute phase of infection. Positive results are indicative of the presence of the identified virus, but do not rule out bacterial infection or co-infection with other pathogens not detected by the test. Clinical correlation with patient history and other diagnostic information is necessary to determine patient infection status. The expected result is Negative. Fact Sheet for Patients:  PinkCheek.be Fact Sheet for Healthcare Providers: GravelBags.it This test is not yet approved or cleared by the [place] FDA and  has been authorized for detection and/or diagnosis of SARS-CoV-2 by FDA under an Emergency Use Authorization (EUA).  This EUA will remain in effect (meaning this test can be used) for the dura tion of  the COVID-19 declaration under Section 564(b)(1) of the Act, 21 U.S.C. section 360bbb-3(b)(1), unless the authorization is terminated or revoked sooner.    Influenza A by PCR NEGATIVE NEGATIVE Final   Influenza B by PCR NEGATIVE NEGATIVE Final    Comment: (NOTE) The Xpert Xpress SARS-CoV-2/FLU/RSV assay is intended as an aid in  the diagnosis of influenza from Nasopharyngeal swab specimens and  should not be used as a sole basis  for treatment. Nasal washings and  aspirates are unacceptable for Xpert Xpress SARS-CoV-2/FLU/RSV  testing. Fact Sheet for Patients: PinkCheek.be Fact Sheet for Healthcare Providers: GravelBags.it This test is not yet approved or cleared by the [place] FDA and  has been authorized for detection and/or diagnosis of SARS-CoV-2 by  FDA under an Emergency Use Authorization (EUA). This EUA will  remain  in effect (meaning this test can be used) for the duration of the  Covid-19 declaration under Section 564(b)(1) of the Act, 21  U.S.C. section 360bbb-3(b)(1), unless the authorization is  terminated or revoked. Performed at [facility], [place] [address], [place], [place] [address]   Urine culture     Status: Abnormal   Collection Time: 04/30/19  2:47 PM   Specimen: Urine, Random  Result Value Ref Range Status   Specimen Description URINE, RANDOM  Final   Special Requests NONE  Final   Culture (A)  Final    >=100,000 COLONIES/mL ESCHERICHIA COLI Two isolates with different morphologies were identified as the same organism.The most resistant organism was reported. Performed at [facility], [place] [address], [place], [place] [address]    Report Status 05/02/2019 FINAL  Final   Organism ID, Bacteria ESCHERICHIA COLI (A)  Final      Susceptibility   Escherichia coli - MIC*    AMPICILLIN <=2 SENSITIVE Sensitive     CEFAZOLIN <=4 SENSITIVE Sensitive     CEFTRIAXONE <=0.25 SENSITIVE Sensitive     CIPROFLOXACIN <=0.25 SENSITIVE Sensitive     GENTAMICIN <=1 SENSITIVE Sensitive     IMIPENEM <=0.25 SENSITIVE Sensitive     NITROFURANTOIN <=16 SENSITIVE Sensitive     TRIMETH/SULFA <=20 SENSITIVE Sensitive     AMPICILLIN/SULBACTAM <=2 SENSITIVE Sensitive     PIP/TAZO <=4 SENSITIVE Sensitive     * >=100,000 COLONIES/mL ESCHERICHIA COLI  Blood culture (routine x 2)     Status: None   Collection Time: 04/30/19  3:00 PM   Specimen: Right Antecubital; Blood  Result Value Ref Range Status   Specimen Description RIGHT ANTECUBITAL  Final   Special Requests   Final    BOTTLES DRAWN AEROBIC AND ANAEROBIC Blood Culture results may not be optimal due to an inadequate volume of blood received in culture bottles   Culture   Final    NO GROWTH 5 DAYS Performed at [facility] [address] [place], [place] [address]    Report Status 05/05/2019 FINAL  Final  Blood culture  (routine x 2)     Status: None   Collection Time: 04/30/19  3:05 PM   Specimen: Left Antecubital; Blood  Result Value Ref Range Status   Specimen Description LEFT ANTECUBITAL  Final   Special Requests   Final    BOTTLES DRAWN AEROBIC AND ANAEROBIC Blood Culture results may not be optimal due to an inadequate volume of blood received in culture bottles   Culture   Final    NO GROWTH 5 DAYS Performed at [facility], [place] [address] [place], [place] [address]    Report Status 05/05/2019 FINAL  Final     Time coordinating discharge: Over 33 minutes  SIGNED:  Roxan Hockey, MD  [facility] 05/07/2019, 12:03 PM Pager [phone]   If 7PM-7AM, please contact night-coverage [email or web address] Password [email or web address]

## 2019-05-06 NOTE — Progress Notes (Addendum)
PROGRESS NOTE    Joy Ward  [id number] DOB: 1940-12-24 DOA: 04/30/2019 PCP: Sandrea Hughs, NP   Brief Narrative:    79 year old woman with history of dementia, hypertension, type 2 diabetes and hypothyroidism presented with altered mental status.  She was found to have a urinary tract infection and is completing treatment with cephalexin.  Patient tested Covid positive on 24 December per documentation has had no symptoms and as such is off of isolation.   Assessment & Plan:   Active Problems:   UTI (urinary tract infection)   Acute metabolic encephalopathy, slowly improving.  She will complete treatment of her urinary tract infection on AM of 1/10.  -Continue delirium precautions. -Continue Seroquel 25 mg at nighttime, recommend decreasing and discontinuing ultimately at skilled nursing.  E. coli urinary tract infection, she will complete treatment on the morning of 1/10 prior to discharge.  Underlying dementia, continue donepezil.  Will reduce quetiapine and suggest reduction in ultimate cessation at rehabilitation given side effect profile.   Hypothyroidism, continue levothyroxine 75 mcg.  Recommend repeating TSH as an outpatient last TSH was slightly below goal.  Type 2 diabetes, continue Metformin  Dyslipidemia, continue atorvastatin  Hypertension, continue amlodipine.  History of Covid positive test.  Documentation received that test was completed on 24 December.  No symptoms at this time continue  Labs: Hemoglobin and all labs stable, no further lab draws unless clinical change  DVT prophylaxis: Lovenox Code Status: Full code Family Communication: Discussed with patient today.  Social work communicated with daughter regarding discharge tomorrow. Personally called granddaughter and gave update as well.  Disposition Plan: Skilled nursing facility to have bed for patient tomorrow will prepare discharge summary   Consultants:   None  Procedures:    Bilateral lower extremity duplex was negative  Antimicrobials:   Ceftriaxone transitioned to cephalexin, and 7-day therapy today.   Subjective: Patient seen this morning.  She reports she is doing well has no complaints and no pain. She is easily redirectable reports she is looking forward to gaining strength and going home.  We discussed skilled nursing placement.  She is amenable.   Objective: Vitals:   05/05/19 2030 05/05/19 2135 05/06/19 0351 05/06/19 1424  BP: (!) 144/78 (!) 105/55 117/71 123/79  Pulse: 86 68 88 94  Resp: 14 18 18    Temp: 98.2 F (36.8 C) 97.6 F (36.4 C) (!) 97.5 F (36.4 C) 99.1 F (37.3 C)  TempSrc: Oral Axillary Oral   SpO2: 98% 98% 100% 100%  Weight:      Height:       No intake or output data in the 24 hours ending 05/06/19 1816 Filed Weights   04/30/19 1235  Weight: 61.2 kg    Examination:  General exam: Appears calm and comfortable, oriented to person Respiratory system: Clear to auscultation. Respiratory effort normal. Cardiovascular system: S1 & S2 heard, RRR. No JVD, murmurs, rubs, gallops or clicks. No pedal edema. Gastrointestinal system: Abdomen is nondistended, soft and nontender. No organomegaly or masses felt. Normal bowel sounds heard. Extremities: No edema.  Psychiatry: Appropriate, alert, oriented to self. No hallucinations. Unsure of her location, wishes to go home.    Data Reviewed: I have personally reviewed following labs and imaging studies  CBC: Recent Labs  Lab 05/01/19 0500 05/02/19 0439 05/03/19 0845 05/04/19 0500 05/05/19 0500  WBC 5.0 6.5 4.9 3.7* 4.6  NEUTROABS 3.5 3.8 2.8 1.7 2.4  HGB 13.0 13.9 13.9 13.4 14.4  HCT 37.8 41.2 41.2 40.2 43.1  MCV 86.5 88.2 88.4 89.1 89.0  PLT 195 222 223 281 [id number]   Basic Metabolic Panel: Recent Labs  Lab 05/01/19 0500 05/02/19 0439 05/03/19 0845 05/04/19 0500 05/05/19 0500  NA 139 139 137 139 138  K 2.9* 4.4 4.2 5.2* 4.0  CL 100 102 101 101 101  CO2 26 24 25  32 22   GLUCOSE 131* 144* 136* 140* 127*  BUN 7* 7* 8 7* 11  CREATININE 0.53 0.54 0.69 0.87 0.63  CALCIUM 9.1 9.3 9.2 9.7 9.6  MG 1.7 2.3 2.0 2.0 1.8  PHOS 2.6 2.5 2.8 3.6 2.9   GFR: Estimated Creatinine Clearance: 53 mL/min (by C-G formula based on SCr of 0.63 mg/dL). Liver Function Tests: Recent Labs  Lab 05/01/19 0500 05/02/19 0439 05/03/19 0845 05/04/19 0500 05/05/19 0500  AST 32 27 26 29 25   ALT 20 20 18 21 20   ALKPHOS 67 71 65 60 58  BILITOT 0.6 0.6 0.5 0.5 0.3  PROT 6.7 7.1 6.7 6.2* 6.8  ALBUMIN 2.9* 3.0* 2.9* 2.7* 3.2*  Anemia Panel: Recent Labs    05/04/19 0500 05/05/19 0500  FERRITIN 77 86   Sepsis Labs: Recent Labs  Lab 04/30/19 1505  LATICACIDVEN 1.7    Recent Results (from the past 240 hour(s))  Respiratory Panel by RT PCR (Flu A&B, Covid) - Nasopharyngeal Swab     Status: Abnormal   Collection Time: 04/30/19  2:37 PM   Specimen: Nasopharyngeal Swab  Result Value Ref Range Status   SARS Coronavirus 2 by RT PCR POSITIVE (A) NEGATIVE Final    Comment: RESULT CALLED TO, READ BACK BY AND VERIFIED WITH: rn j blue [id number] (NOTE) SARS-CoV-2 target nucleic acids are DETECTED. SARS-CoV-2 RNA is generally detectable in upper respiratory specimens  during the acute phase of infection. Positive results are indicative of the presence of the identified virus, but do not rule out bacterial infection or co-infection with other pathogens not detected by the test. Clinical correlation with patient history and other diagnostic information is necessary to determine patient infection status. The expected result is Negative. Fact Sheet for Patients:  PinkCheek.be Fact Sheet for Healthcare Providers: GravelBags.it This test is not yet approved or cleared by the [place] FDA and  has been authorized for detection and/or diagnosis of SARS-CoV-2 by FDA under an Emergency Use Authorization (EUA).  This EUA  will remain in effect (meaning this test can be used) for the dura tion of  the COVID-19 declaration under Section 564(b)(1) of the Act, 21 U.S.C. section 360bbb-3(b)(1), unless the authorization is terminated or revoked sooner.    Influenza A by PCR NEGATIVE NEGATIVE Final   Influenza B by PCR NEGATIVE NEGATIVE Final    Comment: (NOTE) The Xpert Xpress SARS-CoV-2/FLU/RSV assay is intended as an aid in  the diagnosis of influenza from Nasopharyngeal swab specimens and  should not be used as a sole basis for treatment. Nasal washings and  aspirates are unacceptable for Xpert Xpress SARS-CoV-2/FLU/RSV  testing. Fact Sheet for Patients: PinkCheek.be Fact Sheet for Healthcare Providers: GravelBags.it This test is not yet approved or cleared by the [place] FDA and  has been authorized for detection and/or diagnosis of SARS-CoV-2 by  FDA under an Emergency Use Authorization (EUA). This EUA will remain  in effect (meaning this test can be used) for the duration of the  Covid-19 declaration under Section 564(b)(1) of the Act, 21  U.S.C. section 360bbb-3(b)(1), unless the authorization is  terminated or revoked. Performed at [facility], [place] [address], [place], [place] [address]   Urine culture     Status: Abnormal   Collection Time: 04/30/19  2:47 PM   Specimen: Urine, Random  Result Value Ref Range Status   Specimen Description URINE, RANDOM  Final   Special Requests NONE  Final   Culture (A)  Final    >=100,000 COLONIES/mL ESCHERICHIA COLI Two isolates with different morphologies were identified as the same organism.The most resistant organism was reported. Performed at [facility], [place] [address] [place], [place] [address]    Report Status 05/02/2019 FINAL  Final   Organism ID, Bacteria ESCHERICHIA COLI (A)  Final      Susceptibility   Escherichia coli - MIC*    AMPICILLIN <=2 SENSITIVE Sensitive      CEFAZOLIN <=4 SENSITIVE Sensitive     CEFTRIAXONE <=0.25 SENSITIVE Sensitive     CIPROFLOXACIN <=0.25 SENSITIVE Sensitive     GENTAMICIN <=1 SENSITIVE Sensitive     IMIPENEM <=0.25 SENSITIVE Sensitive     NITROFURANTOIN <=16 SENSITIVE Sensitive     TRIMETH/SULFA <=20 SENSITIVE Sensitive     AMPICILLIN/SULBACTAM <=2 SENSITIVE Sensitive     PIP/TAZO <=4 SENSITIVE Sensitive     * >=100,000 COLONIES/mL ESCHERICHIA COLI  Blood culture (routine x 2)     Status: None   Collection Time: 04/30/19  3:00 PM   Specimen: Right Antecubital; Blood  Result Value Ref Range Status   Specimen Description RIGHT ANTECUBITAL  Final   Special Requests   Final    BOTTLES DRAWN AEROBIC AND ANAEROBIC Blood Culture results may not be optimal due to an inadequate volume of blood received in culture bottles   Culture   Final    NO GROWTH 5 DAYS Performed at [facility] [address], [place], [place] [address]    Report Status 05/05/2019 FINAL  Final  Blood culture (routine x 2)     Status: None   Collection Time: 04/30/19  3:05 PM   Specimen: Left Antecubital; Blood  Result Value Ref Range Status   Specimen Description LEFT ANTECUBITAL  Final   Special Requests   Final    BOTTLES DRAWN AEROBIC AND ANAEROBIC Blood Culture results may not be optimal due to an inadequate volume of blood received in culture bottles   Culture   Final    NO GROWTH 5 DAYS Performed at [facility], [place] [address] [place], [place] [address]    Report Status 05/05/2019 FINAL  Final    Radiology Studies: No results found.   Scheduled Meds: . amLODipine  5 mg Oral Daily  . cephALEXin  500 mg Oral Q12H  . donepezil  5 mg Oral QHS  . enoxaparin (LOVENOX) injection  40 mg Subcutaneous Q24H  . insulin aspart  0-9 Units Subcutaneous TID WC  . latanoprost  1 drop Both Eyes QHS  . levothyroxine  75 mcg Oral Q0600  . losartan  100 mg Oral Daily  . metFORMIN  500 mg Oral BID WC  . pravastatin  40 mg Oral q1800  .  QUEtiapine  25 mg Oral QHS   Continuous Infusions:   LOS: 6 days   Time spent: 27 minutes     Dorris Singh, MD   [facility] Pager [phone]  If 7PM-7AM, please contact night-coverage [email or web address] Password [facility] 05/06/2019, 6:16 PM

## 2019-05-07 DIAGNOSIS — R404 Transient alteration of awareness: Secondary | ICD-10-CM | POA: Diagnosis not present

## 2019-05-07 DIAGNOSIS — Z7401 Bed confinement status: Secondary | ICD-10-CM | POA: Diagnosis not present

## 2019-05-07 DIAGNOSIS — E119 Type 2 diabetes mellitus without complications: Secondary | ICD-10-CM | POA: Diagnosis not present

## 2019-05-07 DIAGNOSIS — M2550 Pain in unspecified joint: Secondary | ICD-10-CM | POA: Diagnosis not present

## 2019-05-07 DIAGNOSIS — E785 Hyperlipidemia, unspecified: Secondary | ICD-10-CM | POA: Diagnosis not present

## 2019-05-07 DIAGNOSIS — I10 Essential (primary) hypertension: Secondary | ICD-10-CM | POA: Diagnosis not present

## 2019-05-07 DIAGNOSIS — E039 Hypothyroidism, unspecified: Secondary | ICD-10-CM | POA: Diagnosis not present

## 2019-05-07 DIAGNOSIS — N3000 Acute cystitis without hematuria: Secondary | ICD-10-CM | POA: Diagnosis not present

## 2019-05-07 DIAGNOSIS — U071 COVID-19: Secondary | ICD-10-CM | POA: Diagnosis not present

## 2019-05-07 LAB — GLUCOSE, CAPILLARY
Glucose-Capillary: 225 mg/dL — ABNORMAL HIGH (ref 70–99)
Glucose-Capillary: 86 mg/dL (ref 70–99)

## 2019-05-07 MED ORDER — NOVOLOG FLEXPEN 100 UNIT/ML ~~LOC~~ SOPN: PEN_INJECTOR | SUBCUTANEOUS | 11 refills | Status: DC

## 2019-05-07 MED ORDER — QUETIAPINE FUMARATE 25 MG PO TABS: 25.0000 mg | ORAL_TABLET | Freq: Every day | ORAL | 1 refills | Status: DC

## 2019-05-07 MED ORDER — HYDROCODONE-ACETAMINOPHEN 10-325 MG PO TABS: 1.0000 | ORAL_TABLET | Freq: Four times a day (QID) | ORAL | 0 refills | Status: DC | PRN

## 2019-05-07 MED ORDER — PRAVASTATIN SODIUM 20 MG PO TABS: 20.0000 mg | ORAL_TABLET | Freq: Every day | ORAL | 5 refills | Status: DC

## 2019-05-07 NOTE — Progress Notes (Signed)
                                             Patient Demographics:    Joy Ward, is a 78 y.o. female, DOB - 05-14-1940, [id number]  Admit date - 04/30/2019   Admitting Physician Lequita Halt, MD  Outpatient Primary MD for the patient is Ngetich, Nelda Bucks, NP  LOS - 7   Chief Complaint  Patient presents with  . Covid AMS        Subjective:    Joy Ward today has no fevers, no emesis,  No chest pain,   -Seen and evaluated on patient is stable -Please see full discharge summary dated 05/07/2019  Assessment  & Plan :    Active Problems:   UTI (urinary tract infection)  Brief Summary This is a 79 year old female with history of advanced dementia, diabetes, hypertension, hypothyroidism who presented to the hospital with increased agitation and confusion.  Patient's caregiver is granddaughter.  Found to have UTI on admission and started on antibiotics.  Also found to be COVID-19 positive but has remained hemodynamically stable on room air and has not required any COVID-19 treatment.    --Transfer to SNF  Please see full discharge summary dated 05/07/2019  Roxan Hockey M.D on 05/07/2019 at 11:40 AM  Go to [email or web address] - for contact info  [facility] - Office  [phone]

## 2019-05-07 NOTE — TOC Progression Note (Signed)
Transition of Care [facility]) - Progression Note    Patient Details  Name: Joy Ward MRN: [id number] Date of Birth: 11-30-1940  Transition of Care [facility]) CM/SW [place], [place] Phone Number: [phone] 05/07/2019, 11:23 AM  Clinical Narrative:     CSW spoke with Gerald Stabs at [facility], confirmed bed avail for today. MD informed. Family notified. Pending dc summary and orders.   Expected Discharge Plan: Skilled Nursing Facility Barriers to Discharge: SNF Pending bed offer  Expected Discharge Plan and Services Expected Discharge Plan: [place] In-house Referral: Clinical Social Work   Post Acute Care Choice: [facility] Living arrangements for the past 2 months: Single Family Home                                       Social Determinants of Health (SDOH) Interventions    Readmission Risk Interventions No flowsheet data found.

## 2019-05-07 NOTE — TOC Transition Note (Signed)
Transition of Care [facility]) - CM/SW Discharge Note   Patient Details  Name: Joy Ward MRN: [id number] Date of Birth: 29-Mar-1941  Transition [facility]) CM/SW Contact:  Alberteen Sam, LCSW Phone Number: 05/07/2019, 12:11 PM   Clinical Narrative:     Patient will DC to: [facility] Anticipated DC date: 05/07/19 Family notified:[facility] [id number]  Per MD patient ready for DC to . RN, patient, patient's family, and facility notified of DC. Discharge Summary sent to facility. RN given number for report   [phone]. DC packet on chart. Ambulance transport requested for patient.  CSW signing off.  [place], [place]   Final next level of care: Skilled Nursing Facility Barriers to Discharge: No Barriers Identified   Patient Goals and CMS Choice Patient states their goals for this hospitalization and ongoing recovery are:: Eventually goal is to get to Memory care CMS Medicare.gov Compare Post Acute Care list provided to:: Patient Represenative (must comment)(Tamara) Choice offered to / list presented to : Adult Children  Discharge Placement PASRR number recieved: 05/04/19            Patient chooses bed at: [facility] Patient to be transferred to facility by: [place] Name of family member notified: Jonelle Sidle Patient and family notified of of transfer: 05/07/19  Discharge Plan and Services In-house Referral: Clinical Social Work   Post Acute Care Choice: [place]                               Social Determinants of Health (SDOH) Interventions     Readmission Risk Interventions No flowsheet data found.

## 2019-05-07 NOTE — Progress Notes (Signed)
Report given to Mariann Barter, RN at [facility]

## 2019-05-08 DIAGNOSIS — I10 Essential (primary) hypertension: Secondary | ICD-10-CM | POA: Diagnosis not present

## 2019-05-08 DIAGNOSIS — E119 Type 2 diabetes mellitus without complications: Secondary | ICD-10-CM | POA: Diagnosis not present

## 2019-05-08 DIAGNOSIS — U071 COVID-19: Secondary | ICD-10-CM | POA: Diagnosis not present

## 2019-05-08 DIAGNOSIS — E039 Hypothyroidism, unspecified: Secondary | ICD-10-CM | POA: Diagnosis not present

## 2019-05-09 ENCOUNTER — Ambulatory Visit: Payer: Medicare Other | Admitting: Family

## 2019-05-12 ENCOUNTER — Telehealth: Payer: Self-pay

## 2019-05-12 NOTE — Telephone Encounter (Signed)
Left detailed message on voicemail for Joy Ward with Dr.Reed's decision and requested a return call to reschedule pending appointment with Joy Ward on Wednesday to Dr.Reed's Monday, Thursday or 1/2 day Friday schedule.

## 2019-05-12 NOTE — Telephone Encounter (Signed)
Patients granddaughter Jonelle Sidle called 2 days ago requesting to switch patient to Dr.Reed.   Patient seen Dr.Reed and had a great experience and prefers to see her going forward. I explained to Jonelle Sidle that Dr.Reed has a high volume and works closely with her NP's and despite that information patient would still like a doctor vs a NP as her Primary Care provider.  I sent a staff message to Dr.Reed requesting change.  Dr.Reed agreed.  I called Jonelle Sidle x 2 today and it appeared someone answered the phone both times and did not say anything. Another scenario could be no service or a bad connection.  I will try to call again later

## 2019-05-15 ENCOUNTER — Telehealth (INDEPENDENT_AMBULATORY_CARE_PROVIDER_SITE_OTHER): Payer: Medicare Other | Admitting: Internal Medicine

## 2019-05-15 ENCOUNTER — Other Ambulatory Visit: Payer: Self-pay

## 2019-05-15 ENCOUNTER — Ambulatory Visit: Payer: Medicare Other | Admitting: Internal Medicine

## 2019-05-15 DIAGNOSIS — F5101 Primary insomnia: Secondary | ICD-10-CM | POA: Diagnosis not present

## 2019-05-15 DIAGNOSIS — M5416 Radiculopathy, lumbar region: Secondary | ICD-10-CM

## 2019-05-15 DIAGNOSIS — E039 Hypothyroidism, unspecified: Secondary | ICD-10-CM | POA: Diagnosis not present

## 2019-05-15 DIAGNOSIS — E785 Hyperlipidemia, unspecified: Secondary | ICD-10-CM

## 2019-05-15 DIAGNOSIS — F01518 Vascular dementia, unspecified severity, with other behavioral disturbance: Secondary | ICD-10-CM

## 2019-05-15 DIAGNOSIS — G8929 Other chronic pain: Secondary | ICD-10-CM | POA: Diagnosis not present

## 2019-05-15 DIAGNOSIS — R531 Weakness: Secondary | ICD-10-CM

## 2019-05-15 DIAGNOSIS — E1169 Type 2 diabetes mellitus with other specified complication: Secondary | ICD-10-CM | POA: Diagnosis not present

## 2019-05-15 DIAGNOSIS — F0151 Vascular dementia with behavioral disturbance: Secondary | ICD-10-CM

## 2019-05-15 MED ORDER — FUROSEMIDE 20 MG PO TABS: 20.0000 mg | ORAL_TABLET | Freq: Every day | ORAL | 3 refills | Status: DC

## 2019-05-15 MED ORDER — METFORMIN HCL ER 500 MG PO TB24: ORAL_TABLET | ORAL | 3 refills | Status: DC

## 2019-05-15 MED ORDER — TRAZODONE HCL 50 MG PO TABS: 50.0000 mg | ORAL_TABLET | Freq: Every evening | ORAL | 3 refills | Status: DC | PRN

## 2019-05-15 NOTE — Patient Instructions (Addendum)
Schedule a lab appt within the next week.   I will see you in 6 weeks.  Call [facility] back for home health PT.

## 2019-05-15 NOTE — Progress Notes (Signed)
This service is provided via telemedicine  No vital signs collected/recorded due to the encounter was a telemedicine visit.   Location of patient (ex: home, work): Home   Patient consents to a telephone visit:Yes  Location of the provider (ex: office, home):[facility]   Name of any referring provider:Dr. Mariea Clonts DO  Names of all persons participating in the telemedicine service and their role in the encounter: Dr. Mariea Clonts DO, Adaiah Morken CMA, Joy Ward daughter and patient  Time spent on call: 6 minutes with CMA

## 2019-05-15 NOTE — Progress Notes (Signed)
Virtual Visit via Video Note  I connected with Joy Ward on 05/15/19 at  2:15 PM EST by a video enabled telemedicine application and verified that I am speaking with the correct person using two identifiers.  Location: Patient: home with multiple family members  Provider: [facility] office   I discussed the limitations of evaluation and management by telemedicine and the availability of in person appointments. The patient expressed understanding and agreed to proceed.  History of Present Illness: 79 yo female with h/o dementia--likely vascular component with prior stroke, htn, sinusitis, allergic rhinitis, multinodular goiter with hypothyroidism, DMII with neuropathy, chronic low back pain with radiculopathy, trochanteric bursitis bilaterally, Baker's cyst, hyperlipidemia, constipation, insomnia and recent behavioral symptoms of her dementia especially hallucinations.  These had gotten worse and it led to her hospitalization 1/3-1/10/21 at [facility].  There she was a full code.   She had delirium on dementia and was treated for a UTI, also started on seroquel which was to be tapered off.  She was sleeping better while on this per family, but it has been stopped.  She was at the [facility] for rehab in b/w.  She is back to struggling with her sleep which is the family's #1 concern--we had tried low dose belsomra and they were to let me know if it didn't work after a week so we could adjust the dose but they do not want to try any other doses of this after the first did not help.  Melatonin was also ineffective.  Discussed the risks of ambien/lunesta, etc and how I don't recommend them for her.  Similarly, I don't recommend antipsychotics long-term unless she's a danger to herself or others and cannot be redirected.  They understand this.  Explained risk of stroke as black box warning.  They asked what else we could try and we opted to try trazodone so I send in 50mg  qhs prn insomnia for her.  They want to  get all of her meds through our office rather than the [facility] and wherever else they were getting them.    Pt also c/o body, buttocks and arms hurting her.  She is achy all over.  Says "I'm old".  Does report heating pad helps her a little and her hydrocodone which she's taken for quite some time.  We discussed PT, OT at home for strengthening, home safety eval and they were agreeable at that point (apparently declined [facility] when they'd contacted them post-rehab stay).  They should call Jonelle Sidle to make arrangements.  Her labs showed improved cholesterol and she has tolerated the pravachol ok.  There are not issues with her taking her pills.    F/u labs were recommended after hospitalization.    Observations/Objective: Obese female sitting up in bed with family around her Appears weak and fatigued NAD  Assessment and Plan: 1. Primary insomnia -actually due to her dementia and lack of activity in the day -not responsive to belsomra or melatonin -try traZODone (DESYREL) 50 MG tablet; Take 1 tablet (50 mg total) by mouth at bedtime as needed for sleep.  Dispense: 30 tablet; Refill: 3  2. Vascular dementia with behavior disturbance (HCC) -is no longer having the hallucinations after UTI tx, fluid repletion and rehab stay -provided educational resource info in AVS last time to help them and advice for home care agencies for adl assistance b/c these are not covered by insurance  3. Hypothyroidism, unspecified type - with multinodular goiter hx - f/u labs - TSH; Future  4. Type 2 diabetes mellitus with other specified complication, without long-term current use of insulin (HCC) - control actually has been quite good - has had high cholesterol due to diabetes - discharged on insulin from hospital, but no education provided to family or actual supplies -unclear what happened during snf stay either -no insulin is actually needed with her hba1c of 6.7 at 79 yo with comorbities that  put her at risk for hypoglycemia - CBC with Differential/Platelet; Future - Basic metabolic panel; Future  5.  Hyperlipidemia due to DMII -cont pravastatin as secondary stroke prevention  6.  Chronic low back pain with radiculopathy -cont heating pad, recommended tylenol when pain mild and only use hydrocodone when severe -PT, OT for pain mgt and increasing mobility, home safety eval  7.  Generalized weakness -s/p hospitalization and snf stay -home health PT, OT  Follow Up Instructions:   I discussed the assessment and treatment plan with the patient. The patient was provided an opportunity to ask questions and all were answered. The patient agreed with the plan and demonstrated an understanding of the instructions.   The patient was advised to call back or seek an in-person evaluation if the symptoms worsen or if the condition fails to improve as anticipated.  I provided 45 minutes of non-face-to-face time during this encounter.  Zubayr Bednarczyk L. Anshu Wehner, D.O. [facility] [address], [place] [address] Cell Phone (Mon-Fri 8am-5pm):  [phone] On Call:  [phone] & follow prompts after 5pm & weekends Office Phone:  [phone] Office Fax:  [phone]

## 2019-05-17 ENCOUNTER — Ambulatory Visit: Payer: Self-pay | Admitting: Family

## 2019-05-17 ENCOUNTER — Telehealth: Payer: Self-pay

## 2019-05-17 DIAGNOSIS — G8929 Other chronic pain: Secondary | ICD-10-CM

## 2019-05-17 DIAGNOSIS — M545 Low back pain, unspecified: Secondary | ICD-10-CM

## 2019-05-17 DIAGNOSIS — F0151 Vascular dementia with behavioral disturbance: Secondary | ICD-10-CM

## 2019-05-17 DIAGNOSIS — E785 Hyperlipidemia, unspecified: Secondary | ICD-10-CM

## 2019-05-17 DIAGNOSIS — F01518 Vascular dementia, unspecified severity, with other behavioral disturbance: Secondary | ICD-10-CM

## 2019-05-17 DIAGNOSIS — E1169 Type 2 diabetes mellitus with other specified complication: Secondary | ICD-10-CM

## 2019-05-17 DIAGNOSIS — E039 Hypothyroidism, unspecified: Secondary | ICD-10-CM

## 2019-05-17 NOTE — Telephone Encounter (Signed)
I was under the impression that she was already getting [facility] home health ordered by the hospital.  I guess we need to call the agency and add on PT, OT.

## 2019-05-17 NOTE — Telephone Encounter (Signed)
Ok, I didn't realize the granddaughter had outright denied the services.  She agreed to it when I explained what it was for during the virtual visit on Monday--I thought the agency just needed to be notified to come back out.  I will put in a new referral.  Maybe Lattie Haw can choose a different agency other than [facility] since they declined them before.  None of the actual home health agencies do things like helping with cooking, feeding, etc.  Those are usually done by paid caregivers like [facility], [facility], [facility], etc.  On rare occasion, if income is low enough or patient or husband is veteran, patients may qualify for the government to help pay for that care.

## 2019-05-17 NOTE — Telephone Encounter (Signed)
Patient grandaughter Conley Rolls called about "Joy [facility]". She states that she's waiting for you to send orders to "[facility]". Please Advise.

## 2019-05-17 NOTE — Telephone Encounter (Signed)
I called [facility] and spoke to [place]. She states that on December 2020 Joy Ward originally sent the OT and PT request. Then January 3rd, 2021 Joy Ward was admitted to the Ward again and was sent to Joy Ward on January 10th, 2021 and came home yesterday January 19th, 2021.She stated that grandaughter and her spoke on the phone yesterday. [name] "Joy Ward" was informed of the OT and PT sent from [facility] and she denied the services. [name] states that she doesn't want those services. She wants someone to provide Home Health Care. Such as preparing food, feeding her, helping get dressed, etc. Joy Ward states that her office doesn't provide those services but if request was sent by you that another office would have to send someone out to evaluate if Joy Ward needs that type of care since it's covered by the government. Please Advise.

## 2019-05-18 DIAGNOSIS — E039 Hypothyroidism, unspecified: Secondary | ICD-10-CM | POA: Diagnosis not present

## 2019-05-18 DIAGNOSIS — E119 Type 2 diabetes mellitus without complications: Secondary | ICD-10-CM | POA: Diagnosis not present

## 2019-05-18 DIAGNOSIS — Z7984 Long term (current) use of oral hypoglycemic drugs: Secondary | ICD-10-CM | POA: Diagnosis not present

## 2019-05-18 DIAGNOSIS — G9341 Metabolic encephalopathy: Secondary | ICD-10-CM | POA: Diagnosis not present

## 2019-05-18 DIAGNOSIS — I10 Essential (primary) hypertension: Secondary | ICD-10-CM

## 2019-05-18 DIAGNOSIS — E785 Hyperlipidemia, unspecified: Secondary | ICD-10-CM

## 2019-05-18 DIAGNOSIS — Z8616 Personal history of COVID-19: Secondary | ICD-10-CM | POA: Diagnosis not present

## 2019-05-18 DIAGNOSIS — F0151 Vascular dementia with behavioral disturbance: Secondary | ICD-10-CM | POA: Diagnosis not present

## 2019-05-18 DIAGNOSIS — F0280 Dementia in other diseases classified elsewhere without behavioral disturbance: Secondary | ICD-10-CM | POA: Diagnosis not present

## 2019-05-18 DIAGNOSIS — B9620 Unspecified Escherichia coli [E. coli] as the cause of diseases classified elsewhere: Secondary | ICD-10-CM | POA: Diagnosis not present

## 2019-05-18 DIAGNOSIS — N390 Urinary tract infection, site not specified: Secondary | ICD-10-CM | POA: Diagnosis not present

## 2019-05-22 ENCOUNTER — Other Ambulatory Visit: Payer: Medicare Other

## 2019-05-22 ENCOUNTER — Other Ambulatory Visit: Payer: Self-pay

## 2019-05-22 ENCOUNTER — Ambulatory Visit (INDEPENDENT_AMBULATORY_CARE_PROVIDER_SITE_OTHER): Payer: Medicare Other | Admitting: Internal Medicine

## 2019-05-22 ENCOUNTER — Encounter: Payer: Self-pay | Admitting: Internal Medicine

## 2019-05-22 VITALS — BP 118/76 | HR 92 | Temp 96.8°F | Ht 65.0 in | Wt 152.0 lb

## 2019-05-22 DIAGNOSIS — M545 Low back pain, unspecified: Secondary | ICD-10-CM

## 2019-05-22 DIAGNOSIS — K5901 Slow transit constipation: Secondary | ICD-10-CM | POA: Diagnosis not present

## 2019-05-22 DIAGNOSIS — F01518 Vascular dementia, unspecified severity, with other behavioral disturbance: Secondary | ICD-10-CM

## 2019-05-22 DIAGNOSIS — G8929 Other chronic pain: Secondary | ICD-10-CM | POA: Diagnosis not present

## 2019-05-22 DIAGNOSIS — R350 Frequency of micturition: Secondary | ICD-10-CM | POA: Diagnosis not present

## 2019-05-22 DIAGNOSIS — F0151 Vascular dementia with behavioral disturbance: Secondary | ICD-10-CM | POA: Diagnosis not present

## 2019-05-22 DIAGNOSIS — I10 Essential (primary) hypertension: Secondary | ICD-10-CM | POA: Diagnosis not present

## 2019-05-22 DIAGNOSIS — E1169 Type 2 diabetes mellitus with other specified complication: Secondary | ICD-10-CM | POA: Diagnosis not present

## 2019-05-22 DIAGNOSIS — N3281 Overactive bladder: Secondary | ICD-10-CM

## 2019-05-22 NOTE — Addendum Note (Signed)
Addended by: Hollace Kinnier L on: 05/22/2019 11:21 AM   Modules accepted: Level of Service
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
## 2019-05-23 NOTE — Progress Notes (Signed)
Urine looks like she has a UTI.  I'd prefer to wait on the culture which will hopefully return tomorrow so we don't have to change antibiotics part way through.

## 2019-05-24 ENCOUNTER — Other Ambulatory Visit: Payer: Self-pay

## 2019-05-24 ENCOUNTER — Encounter (HOSPITAL_COMMUNITY): Payer: Self-pay

## 2019-05-24 ENCOUNTER — Emergency Department (HOSPITAL_COMMUNITY)
Admission: EM | Admit: 2019-05-24 | Discharge: 2019-05-24 | Disposition: A | Discharge status: Home / Self Care | Payer: Medicare Other | Attending: Emergency Medicine | Admitting: Emergency Medicine

## 2019-05-24 DIAGNOSIS — Z7984 Long term (current) use of oral hypoglycemic drugs: Secondary | ICD-10-CM | POA: Insufficient documentation

## 2019-05-24 DIAGNOSIS — I10 Essential (primary) hypertension: Secondary | ICD-10-CM | POA: Diagnosis not present

## 2019-05-24 DIAGNOSIS — F0390 Unspecified dementia without behavioral disturbance: Secondary | ICD-10-CM | POA: Diagnosis not present

## 2019-05-24 DIAGNOSIS — R350 Frequency of micturition: Secondary | ICD-10-CM | POA: Diagnosis present

## 2019-05-24 DIAGNOSIS — N3000 Acute cystitis without hematuria: Secondary | ICD-10-CM | POA: Diagnosis not present

## 2019-05-24 DIAGNOSIS — E039 Hypothyroidism, unspecified: Secondary | ICD-10-CM | POA: Diagnosis not present

## 2019-05-24 DIAGNOSIS — Z79899 Other long term (current) drug therapy: Secondary | ICD-10-CM | POA: Diagnosis not present

## 2019-05-24 DIAGNOSIS — Z87891 Personal history of nicotine dependence: Secondary | ICD-10-CM | POA: Insufficient documentation

## 2019-05-24 DIAGNOSIS — E119 Type 2 diabetes mellitus without complications: Secondary | ICD-10-CM | POA: Insufficient documentation

## 2019-05-24 DIAGNOSIS — R4182 Altered mental status, unspecified: Secondary | ICD-10-CM | POA: Insufficient documentation

## 2019-05-24 LAB — CBC WITH DIFFERENTIAL/PLATELET
Abs Immature Granulocytes: 0.05 10*3/uL (ref 0.00–0.07)
Basophils Absolute: 0 10*3/uL (ref 0.0–0.1)
Basophils Relative: 0 %
Eosinophils Absolute: 0 10*3/uL (ref 0.0–0.5)
Eosinophils Relative: 0 %
HCT: 32.2 % — ABNORMAL LOW (ref 36.0–46.0)
Hemoglobin: 10.7 g/dL — ABNORMAL LOW (ref 12.0–15.0)
Immature Granulocytes: 1 %
Lymphocytes Relative: 14 %
Lymphs Abs: 1 10*3/uL (ref 0.7–4.0)
MCH: 29.7 pg (ref 26.0–34.0)
MCHC: 33.2 g/dL (ref 30.0–36.0)
MCV: 89.4 fL (ref 80.0–100.0)
Monocytes Absolute: 1.1 10*3/uL — ABNORMAL HIGH (ref 0.1–1.0)
Monocytes Relative: 15 %
Neutro Abs: 5.2 10*3/uL (ref 1.7–7.7)
Neutrophils Relative %: 70 %
Platelets: 188 10*3/uL (ref 150–400)
RBC: 3.6 MIL/uL — ABNORMAL LOW (ref 3.87–5.11)
RDW: 13.5 % (ref 11.5–15.5)
WBC: 7.4 10*3/uL (ref 4.0–10.5)
nRBC: 0 % (ref 0.0–0.2)

## 2019-05-24 LAB — URINE CULTURE
MICRO NUMBER:: 10078252
SPECIMEN QUALITY:: ADEQUATE

## 2019-05-24 LAB — COMPREHENSIVE METABOLIC PANEL
ALT: 15 U/L (ref 0–44)
AST: 19 U/L (ref 15–41)
Albumin: 3 g/dL — ABNORMAL LOW (ref 3.5–5.0)
Alkaline Phosphatase: 77 U/L (ref 38–126)
Anion gap: 11 (ref 5–15)
BUN: 7 mg/dL — ABNORMAL LOW (ref 8–23)
CO2: 25 mmol/L (ref 22–32)
Calcium: 9.1 mg/dL (ref 8.9–10.3)
Chloride: 101 mmol/L (ref 98–111)
Creatinine, Ser: 0.67 mg/dL (ref 0.44–1.00)
GFR calc Af Amer: >60 mL/min (ref >60)
GFR calc non Af Amer: >60 mL/min (ref >60)
Glucose, Bld: 204 mg/dL — ABNORMAL HIGH (ref 70–99)
Potassium: 3.7 mmol/L (ref 3.5–5.1)
Sodium: 137 mmol/L (ref 135–145)
Total Bilirubin: 0.8 mg/dL (ref 0.3–1.2)
Total Protein: 6.8 g/dL (ref 6.5–8.1)

## 2019-05-24 LAB — URINALYSIS, ROUTINE W REFLEX MICROSCOPIC
Bilirubin Urine: NEGATIVE
Glucose, UA: NEGATIVE
Hyaline Cast: NONE SEEN /LPF
Ketones, ur: NEGATIVE
Nitrite: POSITIVE — AB
Specific Gravity, Urine: 1.011 (ref 1.001–1.03)
pH: 6 (ref 5.0–8.0)

## 2019-05-24 MED ORDER — SODIUM CHLORIDE 0.9 % IV SOLN
1.0000 g | Freq: Once | INTRAVENOUS | Status: AC
  Administered 2019-05-24: 1 g via INTRAVENOUS
  Filled 2019-05-24: qty 10

## 2019-05-24 MED ORDER — CEPHALEXIN 500 MG PO CAPS: 500.0000 mg | ORAL_CAPSULE | Freq: Four times a day (QID) | ORAL | 0 refills | Status: DC

## 2019-05-24 NOTE — ED Provider Notes (Signed)
[facility] DEPT Provider Note   CSN: [id number] Arrival date & time: 05/24/19  1034     History Chief Complaint  Patient presents with  . Urinary Frequency  . Altered Mental Status    Joy Ward is a 79 y.o. female.  Pt presents to the ED today with AMS and a possible UTI.  The pt does have a hx of dementia and urinary incontinence.  She wears a depends.  Her daughter said she's been hallucinating and has been more confused.  She did take her to her doctor's office on 1/25.  Her urine was sent for cx and was + for e.coli and pan-sensitive.  Pt        Past Medical History:  Diagnosis Date  . Allergy   . Arthritis   . Chronic back pain   . DDD (degenerative disc disease), lumbar   . Diabetes mellitus without complication ([place])   . Diabetes type 2, uncontrolled ([place]) 09/18/2006   Annotation: Noninsulin dependent Qualifier: Diagnosis of  By: Amil Amen MD, Benjamine Mola    . Former tobacco use   . Hyperlipidemia    a. H/o muscle aches with statins per PCP notes.  . Hypertension   . Hypothyroidism    a. prior thyroid removal.  . Memory loss or impairment 03/29/2019  . Stroke [facility])    a. Listed in PCP notes: "History of stroke:  History of terrible headache.  Had a CT scan of brain and found "ministroke"  Was removed from ERT subsequently. "    Patient Active Problem List   Diagnosis Date Noted  . Altered mental status   . UTI (urinary tract infection) 04/30/2019  . Dementia without behavioral disturbance ([place])   . Vascular dementia with behavior disturbance ([place]) 04/14/2019  . Primary insomnia 04/14/2019  . Hypothyroidism 04/14/2019  . Diabetes mellitus ([place]) 04/14/2019  . Memory loss or impairment 03/29/2019  . Diabetic peripheral neuropathy associated with type 2 diabetes mellitus ([place]) 12/29/2017  . Peripheral edema 12/29/2017  . Elevated liver enzymes 10/15/2016  . Chest pain 04/22/2016  . Systolic murmur [id number]  . Hypercalcemia  04/22/2016  . Hyperlipidemia 04/22/2016  . Baker's cyst of knee, right 04/21/2016  . HYPOKALEMIA 01/24/2010  . CONSTIPATION 12/23/2009  . INTERMITTENT VERTIGO 12/23/2009  . SINUSITIS, ACUTE 04/25/2009  . VAGINITIS, CANDIDAL 02/14/2009  . HIP PAIN, RIGHT 02/14/2009  . HYPERLIPIDEMIA, MIXED 01/01/2009  . BACK PAIN, LUMBAR, WITH RADICULOPATHY 12/25/2008  . KNEE PAIN, BILATERAL 12/18/2008  . TROCHANTERIC BURSITIS, BILATERAL 12/18/2008  . Disturbance in sleep behavior 12/18/2008  . ONYCHOMYCOSIS, TOENAILS 10/30/2008  . ENTERITIS, CLOSTRIDIUM DIFFICILE 10/15/2006  . Diabetes type 2, uncontrolled ([place]) 09/18/2006  . Essential hypertension 09/18/2006  . Allergic rhinitis 09/18/2006  . GOITER, MULTINODULAR 06/29/2006  . STROKE 04/27/2001    Past Surgical History:  Procedure Laterality Date  . ABDOMINAL HYSTERECTOMY  1994   TAH, not clear if unilateral oophorectomy as well.  Marland Kitchen [place]  . CHOLECYSTECTOMY  1973   open  . DECOMPRESSIVE LUMBAR LAMINECTOMY LEVEL 4  2005   In [place]  . THYROIDECTOMY  2012   multinodular goiter     OB History   No obstetric history on file.     Family History  Problem Relation Age of Onset  . Heart disease Mother        unclear details "atherosclerosis"  . Diabetes Mother   . Alcohol abuse Sister   . Diabetes Brother   . Diabetes Brother   .  Drug abuse Brother   . Diabetes Daughter   . Heart disease Son        CHF  . Alcohol abuse Son     Social History   Tobacco Use  . Smoking status: Former Research scientist (life sciences)  . Smokeless tobacco: Never Used  . Tobacco comment: No smoking since 79 yo - smoked 10 yrs  Substance Use Topics  . Alcohol use: No  . Drug use: No    Home Medications Prior to Admission medications   Medication Sig Start Date End Date Taking? Authorizing Provider  amLODipine (NORVASC) 5 MG tablet Take 1 tablet (5 mg total) by mouth daily. 04/05/19   Mack Hook, MD  bimatoprost (LUMIGAN) 0.01 %  SOLN Place 1 drop into the left eye at bedtime. 03/04/18   Mack Hook, MD  cephALEXin (KEFLEX) 500 MG capsule Take 1 capsule (500 mg total) by mouth 4 (four) times daily. 05/24/19   Isla Pence, MD  diclofenac sodium (VOLTAREN) 1 % GEL Apply 4 g topically 2 (two) times daily as needed (pain). 03/04/18   Mack Hook, MD  donepezil (ARICEPT) 5 MG tablet Take 1 tablet (5 mg total) by mouth at bedtime. 04/05/19   Mack Hook, MD  furosemide (LASIX) 20 MG tablet Take 1 tablet (20 mg total) by mouth daily. 05/15/19   Reed, Tiffany L, DO  HYDROcodone-acetaminophen (NORCO) 10-325 MG tablet Take 1 tablet by mouth every 6 (six) hours as needed for moderate pain or severe pain. 05/07/19   Roxan Hockey, MD  levothyroxine (SYNTHROID) 75 MCG tablet Take 1 tablet (75 mcg total) by mouth daily before breakfast. 04/14/19   Reed, Tiffany L, DO  losartan (COZAAR) 100 MG tablet Take 1 tablet (100 mg total) by mouth daily. 04/05/19   Mack Hook, MD  metFORMIN (GLUCOPHAGE-XR) 500 MG 24 hr tablet 1 tab by mouth twice daily with meals 05/15/19   Reed, Tiffany L, DO  Multiple Vitamin (MULTIVITAMIN WITH MINERALS) TABS tablet Take 1 tablet by mouth daily.    [provider]  Omega-3 Fatty Acids (FISH OIL) 1000 MG CAPS Take 1,000 mg by mouth daily.     [provider]  pravastatin (PRAVACHOL) 20 MG tablet Take 1 tablet (20 mg total) by mouth daily at 6 PM. 05/07/19   Emokpae, Courage, MD  traZODone (DESYREL) 50 MG tablet Take 1 tablet (50 mg total) by mouth at bedtime as needed for sleep. 05/15/19   Reed, Tiffany L, DO    Allergies    Clindamycin hcl, Sulfamethoxazole-trimethoprim, Zanaflex [tizanidine hcl], Metronidazole, Statins, Ace inhibitors, and Adhesive [tape]  Review of Systems   Review of Systems  Physical Exam Updated Vital Signs BP 139/84   Pulse 62   Temp 98 F (36.7 C) (Oral)   Resp 16   Ht 5\' 5"  (1.651 m)   Wt 68.9 kg   SpO2 99%   BMI 25.29 kg/m    Physical Exam  ED Results / Procedures / Treatments   Labs (all labs ordered are listed, but only abnormal results are displayed) Labs Reviewed  COMPREHENSIVE METABOLIC PANEL - Abnormal; Notable for the following components:      Result Value   Glucose, Bld 204 (*)    BUN 7 (*)    Albumin 3.0 (*)    All other components within normal limits  CBC WITH DIFFERENTIAL/PLATELET - Abnormal; Notable for the following components:   RBC 3.60 (*)    Hemoglobin 10.7 (*)    HCT 32.2 (*)    Monocytes  Absolute 1.1 (*)    All other components within normal limits    EKG None  Radiology No results found.  Procedures Procedures (including critical care time)  Medications Ordered in ED Medications  cefTRIAXone (ROCEPHIN) 1 g in sodium chloride 0.9 % 100 mL IVPB (0 g Intravenous Stopped 05/24/19 1227)    ED Course  I have reviewed the triage vital signs and the nursing notes.  Pertinent labs & imaging results that were available during my care of the patient were reviewed by me and considered in my medical decision making (see chart for details).    MDM Rules/Calculators/A&P                      Pt's urine culture is sensitive to keflex, so she will be d/c with that.  Pt does not appear to be septic.  She is stable for d/c.  F/u with pcp. Return if worse.  Final Clinical Impression(s) / ED Diagnoses Final diagnoses:  Acute cystitis without hematuria    Rx / DC Orders ED Discharge Orders         Ordered    cephALEXin (KEFLEX) 500 MG capsule  4 times daily     05/24/19 1319           Isla Pence, MD 05/24/19 1322

## 2019-05-24 NOTE — ED Notes (Signed)
Pt given sandwich, drink, and warm blanket at this time.

## 2019-05-24 NOTE — ED Triage Notes (Signed)
Patient's daughter reported that the patient was treated for a UTI approx one month ago and has been having increased urinary frequency and altered mental status.

## 2019-05-24 NOTE — Progress Notes (Signed)
Begin keflex 500mg  po bid for 7 days for UTI.  Please send to her pharmacy.

## 2019-05-25 NOTE — Progress Notes (Signed)
Hmm.  Ok.  We had gone over how the hallucinations may not even have anything to do with her UTI and could be progression of her dementia anyway.  Also, that her urinary symptoms may be permanent, as well, from overactive bladder and incontinence.  Cultures may be turning up positive due to colonization.  Could be coincidental not the cause of her symptoms.

## 2019-05-26 ENCOUNTER — Ambulatory Visit: Payer: Medicare Other | Admitting: Internal Medicine

## 2019-05-26 ENCOUNTER — Telehealth: Payer: Self-pay

## 2019-05-26 NOTE — Telephone Encounter (Signed)
Noted.  Unfortunately, Joy Ward is just alert enough to express that she does not want things, but does not have good judgment and insight due to her dementia.  She's not going to get any of the resources needed unless family steps in and accepts these things on her behalf.

## 2019-05-26 NOTE — Telephone Encounter (Signed)
Merry Proud is the Physical Therapist from [place]. He called and states that he went to see patient "Joy Ward" and she denied all [place]. Patients caregiver is aware of what transpired. He states that you can speak to him at [phone]. Please Advise.

## 2019-06-01 ENCOUNTER — Other Ambulatory Visit: Payer: Medicare Other

## 2019-06-04 DIAGNOSIS — I10 Essential (primary) hypertension: Secondary | ICD-10-CM | POA: Diagnosis not present

## 2019-06-04 DIAGNOSIS — U071 COVID-19: Secondary | ICD-10-CM | POA: Diagnosis not present

## 2019-06-04 DIAGNOSIS — E119 Type 2 diabetes mellitus without complications: Secondary | ICD-10-CM | POA: Diagnosis not present

## 2019-06-04 DIAGNOSIS — E039 Hypothyroidism, unspecified: Secondary | ICD-10-CM | POA: Diagnosis not present

## 2019-06-12 ENCOUNTER — Other Ambulatory Visit: Payer: Self-pay

## 2019-06-12 DIAGNOSIS — F5101 Primary insomnia: Secondary | ICD-10-CM

## 2019-06-12 MED ORDER — TRAZODONE HCL 100 MG PO TABS: 100.0000 mg | ORAL_TABLET | Freq: Every evening | ORAL | 5 refills | Status: DC | PRN

## 2019-06-12 NOTE — Telephone Encounter (Signed)
[name], patient's daughter, called about trazodone dose.  She states that it had been discussed in her last visit increasing dose to 100 mg.  She had been taking two of the 50 mg and when they tried to refill it the pharmacy said it was too soon.  I do see in your last note where daughter is requesting increase in trazodone, but I do not see it addressed elsewhere in the note and med list does not show an increase in dose.  . Medical Management of Chronic Issues    fill out FL-@ forms, possible UTI per [name] her daughter , would like increase in Trazadone

## 2019-06-14 DIAGNOSIS — E119 Type 2 diabetes mellitus without complications: Secondary | ICD-10-CM | POA: Diagnosis not present

## 2019-06-14 DIAGNOSIS — G9341 Metabolic encephalopathy: Secondary | ICD-10-CM | POA: Diagnosis not present

## 2019-06-14 DIAGNOSIS — N390 Urinary tract infection, site not specified: Secondary | ICD-10-CM | POA: Diagnosis not present

## 2019-06-14 DIAGNOSIS — F0280 Dementia in other diseases classified elsewhere without behavioral disturbance: Secondary | ICD-10-CM | POA: Diagnosis not present

## 2019-06-14 DIAGNOSIS — B9620 Unspecified Escherichia coli [E. coli] as the cause of diseases classified elsewhere: Secondary | ICD-10-CM | POA: Diagnosis not present

## 2019-06-14 DIAGNOSIS — E039 Hypothyroidism, unspecified: Secondary | ICD-10-CM | POA: Diagnosis not present

## 2019-06-17 DIAGNOSIS — G9341 Metabolic encephalopathy: Secondary | ICD-10-CM | POA: Diagnosis not present

## 2019-06-17 DIAGNOSIS — Z8616 Personal history of COVID-19: Secondary | ICD-10-CM | POA: Diagnosis not present

## 2019-06-17 DIAGNOSIS — F0280 Dementia in other diseases classified elsewhere without behavioral disturbance: Secondary | ICD-10-CM | POA: Diagnosis not present

## 2019-06-17 DIAGNOSIS — E119 Type 2 diabetes mellitus without complications: Secondary | ICD-10-CM | POA: Diagnosis not present

## 2019-06-17 DIAGNOSIS — I10 Essential (primary) hypertension: Secondary | ICD-10-CM | POA: Diagnosis not present

## 2019-06-17 DIAGNOSIS — N390 Urinary tract infection, site not specified: Secondary | ICD-10-CM | POA: Diagnosis not present

## 2019-06-17 DIAGNOSIS — E785 Hyperlipidemia, unspecified: Secondary | ICD-10-CM | POA: Diagnosis not present

## 2019-06-17 DIAGNOSIS — Z7984 Long term (current) use of oral hypoglycemic drugs: Secondary | ICD-10-CM | POA: Diagnosis not present

## 2019-06-17 DIAGNOSIS — B9620 Unspecified Escherichia coli [E. coli] as the cause of diseases classified elsewhere: Secondary | ICD-10-CM | POA: Diagnosis not present

## 2019-06-17 DIAGNOSIS — E039 Hypothyroidism, unspecified: Secondary | ICD-10-CM | POA: Diagnosis not present

## 2019-06-18 ENCOUNTER — Other Ambulatory Visit: Payer: Self-pay

## 2019-06-18 ENCOUNTER — Ambulatory Visit (HOSPITAL_COMMUNITY)
Admission: EM | Admit: 2019-06-18 | Discharge: 2019-06-18 | Disposition: A | Discharge status: Home / Self Care | Payer: Medicare Other | Attending: Internal Medicine | Admitting: Internal Medicine

## 2019-06-18 ENCOUNTER — Encounter (HOSPITAL_COMMUNITY): Payer: Self-pay | Admitting: Family Medicine

## 2019-06-18 DIAGNOSIS — Z87891 Personal history of nicotine dependence: Secondary | ICD-10-CM | POA: Diagnosis not present

## 2019-06-18 DIAGNOSIS — R011 Cardiac murmur, unspecified: Secondary | ICD-10-CM | POA: Insufficient documentation

## 2019-06-18 DIAGNOSIS — Z7984 Long term (current) use of oral hypoglycemic drugs: Secondary | ICD-10-CM | POA: Insufficient documentation

## 2019-06-18 DIAGNOSIS — N390 Urinary tract infection, site not specified: Secondary | ICD-10-CM | POA: Diagnosis not present

## 2019-06-18 DIAGNOSIS — E1142 Type 2 diabetes mellitus with diabetic polyneuropathy: Secondary | ICD-10-CM | POA: Insufficient documentation

## 2019-06-18 DIAGNOSIS — E039 Hypothyroidism, unspecified: Secondary | ICD-10-CM | POA: Insufficient documentation

## 2019-06-18 DIAGNOSIS — Z79899 Other long term (current) drug therapy: Secondary | ICD-10-CM | POA: Diagnosis not present

## 2019-06-18 DIAGNOSIS — F0151 Vascular dementia with behavioral disturbance: Secondary | ICD-10-CM | POA: Diagnosis not present

## 2019-06-18 DIAGNOSIS — Z87440 Personal history of urinary (tract) infections: Secondary | ICD-10-CM | POA: Insufficient documentation

## 2019-06-18 DIAGNOSIS — Z8616 Personal history of COVID-19: Secondary | ICD-10-CM | POA: Diagnosis not present

## 2019-06-18 DIAGNOSIS — E782 Mixed hyperlipidemia: Secondary | ICD-10-CM | POA: Diagnosis not present

## 2019-06-18 DIAGNOSIS — E876 Hypokalemia: Secondary | ICD-10-CM | POA: Insufficient documentation

## 2019-06-18 DIAGNOSIS — Z20822 Contact with and (suspected) exposure to covid-19: Secondary | ICD-10-CM | POA: Diagnosis not present

## 2019-06-18 DIAGNOSIS — I10 Essential (primary) hypertension: Secondary | ICD-10-CM | POA: Insufficient documentation

## 2019-06-18 DIAGNOSIS — Z8673 Personal history of transient ischemic attack (TIA), and cerebral infarction without residual deficits: Secondary | ICD-10-CM | POA: Diagnosis not present

## 2019-06-18 DIAGNOSIS — M1990 Unspecified osteoarthritis, unspecified site: Secondary | ICD-10-CM | POA: Insufficient documentation

## 2019-06-18 DIAGNOSIS — M5136 Other intervertebral disc degeneration, lumbar region: Secondary | ICD-10-CM | POA: Insufficient documentation

## 2019-06-18 LAB — POCT URINALYSIS DIP (DEVICE)
Bilirubin Urine: NEGATIVE
Glucose, UA: NEGATIVE mg/dL
Ketones, ur: NEGATIVE mg/dL
Nitrite: NEGATIVE
Protein, ur: NEGATIVE mg/dL
Specific Gravity, Urine: 1.015 (ref 1.005–1.030)
Urobilinogen, UA: 0.2 mg/dL (ref 0.0–1.0)
pH: 6 (ref 5.0–8.0)

## 2019-06-18 MED ORDER — CEPHALEXIN 500 MG PO CAPS: 500.0000 mg | ORAL_CAPSULE | Freq: Two times a day (BID) | ORAL | 0 refills | Status: DC

## 2019-06-18 NOTE — ED Notes (Signed)
A phone call was made to one of the pt's emergency contacts, Cleatis Polka.  She was not able to answer any medical questions for the pt. She did states her PCP updated her information when she was last seen.  Family did not have full knowledge of her allergies or medications.

## 2019-06-18 NOTE — Discharge Instructions (Signed)
Treating for urinary tract infection This seems to be a reoccurring issue. You may need to see a urologist for follow-up Covid test done for assisted living Follow up as needed for continued or worsening symptoms

## 2019-06-18 NOTE — ED Triage Notes (Addendum)
Pt is unable to say what she wants to say, she was here with her daughter, but the daughter left.    The daughter left a note saying she is having urinary frequency and has been hallucinating.  She also needs a Covid test to get into a new ALF.  She was positive for Covid in January.  Pt is a poor historian and is not able to answer any questions with certainty.

## 2019-06-19 LAB — NOVEL CORONAVIRUS, NAA (HOSP ORDER, SEND-OUT TO REF LAB; TAT 18-24 HRS): SARS-CoV-2, NAA: NOT DETECTED

## 2019-06-20 LAB — URINE CULTURE: Culture: >=100000 — AB

## 2019-06-20 NOTE — ED Provider Notes (Signed)
[facility]    CSN: [id number] Arrival date & time: 06/18/19  1627      History   Chief Complaint Chief Complaint  Patient presents with  . Urinary Tract Infection  . Covid Test    HPI Joy Ward is a 79 y.o. female.   Patient is a 79 year old female past medical history of allergy, arthritis, chronic back pain, degenerative disc disease, diabetes, hyperlipidemia, retention, memory loss, stroke, dementia.  She presents today with daughter.  Per daughter she has been having urinary frequency and hallucinating.  This is similar to previous when she had urinary tract infections.  Patient has recurrent UTIs.  Denies any associate abdominal pain, fever, dysuria, hematuria.  Patient also needing a Covid swab for admission to assisted living facility.  Tested positive for Covid in January but recovered.  He is currently not having any symptoms associated with this.     Past Medical History:  Diagnosis Date  . Allergy   . Arthritis   . Chronic back pain   . DDD (degenerative disc disease), lumbar   . Diabetes mellitus without complication ([place])   . Diabetes type 2, uncontrolled ([place]) 09/18/2006   Annotation: Noninsulin dependent Qualifier: Diagnosis of  By: Amil Amen MD, Benjamine Mola    . Former tobacco use   . Hyperlipidemia    a. H/o muscle aches with statins per PCP notes.  . Hypertension   . Hypothyroidism    a. prior thyroid removal.  . Memory loss or impairment 03/29/2019  . Stroke [facility])    a. Listed in PCP notes: "History of stroke:  History of terrible headache.  Had a CT scan of brain and found "ministroke"  Was removed from ERT subsequently. "    Patient Active Problem List   Diagnosis Date Noted  . Altered mental status   . UTI (urinary tract infection) 04/30/2019  . Dementia without behavioral disturbance ([place])   . Vascular dementia with behavior disturbance ([place]) 04/14/2019  . Primary insomnia 04/14/2019  . Hypothyroidism 04/14/2019  . Diabetes  mellitus ([place]) 04/14/2019  . Memory loss or impairment 03/29/2019  . Diabetic peripheral neuropathy associated with type 2 diabetes mellitus ([place]) 12/29/2017  . Peripheral edema 12/29/2017  . Elevated liver enzymes 10/15/2016  . Chest pain 04/22/2016  . Systolic murmur [id number]  . Hypercalcemia 04/22/2016  . Hyperlipidemia 04/22/2016  . Baker's cyst of knee, right 04/21/2016  . HYPOKALEMIA 01/24/2010  . CONSTIPATION 12/23/2009  . INTERMITTENT VERTIGO 12/23/2009  . SINUSITIS, ACUTE 04/25/2009  . VAGINITIS, CANDIDAL 02/14/2009  . HIP PAIN, RIGHT 02/14/2009  . HYPERLIPIDEMIA, MIXED 01/01/2009  . BACK PAIN, LUMBAR, WITH RADICULOPATHY 12/25/2008  . KNEE PAIN, BILATERAL 12/18/2008  . TROCHANTERIC BURSITIS, BILATERAL 12/18/2008  . Disturbance in sleep behavior 12/18/2008  . ONYCHOMYCOSIS, TOENAILS 10/30/2008  . ENTERITIS, CLOSTRIDIUM DIFFICILE 10/15/2006  . Diabetes type 2, uncontrolled ([place]) 09/18/2006  . Essential hypertension 09/18/2006  . Allergic rhinitis 09/18/2006  . GOITER, MULTINODULAR 06/29/2006  . STROKE 04/27/2001    Past Surgical History:  Procedure Laterality Date  . ABDOMINAL HYSTERECTOMY  1994   TAH, not clear if unilateral oophorectomy as well.  Marland Kitchen [place]  . CHOLECYSTECTOMY  1973   open  . DECOMPRESSIVE LUMBAR LAMINECTOMY LEVEL 4  2005   In [place]  . THYROIDECTOMY  2012   multinodular goiter    OB History   No obstetric history on file.      Home Medications    Prior to Admission medications  Medication Sig Start Date End Date Taking? Authorizing Provider  amLODipine (NORVASC) 5 MG tablet Take 1 tablet (5 mg total) by mouth daily. 04/05/19   Mack Hook, MD  bimatoprost (LUMIGAN) 0.01 % SOLN Place 1 drop into the left eye at bedtime. 03/04/18   Mack Hook, MD  cephALEXin (KEFLEX) 500 MG capsule Take 1 capsule (500 mg total) by mouth 2 (two) times daily for 7 days. 06/18/19 06/25/19  Loura Halt A, NP    diclofenac sodium (VOLTAREN) 1 % GEL Apply 4 g topically 2 (two) times daily as needed (pain). 03/04/18   Mack Hook, MD  donepezil (ARICEPT) 5 MG tablet Take 1 tablet (5 mg total) by mouth at bedtime. 04/05/19   Mack Hook, MD  furosemide (LASIX) 20 MG tablet Take 1 tablet (20 mg total) by mouth daily. 05/15/19   Reed, Tiffany L, DO  HYDROcodone-acetaminophen (NORCO) 10-325 MG tablet Take 1 tablet by mouth every 6 (six) hours as needed for moderate pain or severe pain. 05/07/19   Roxan Hockey, MD  levothyroxine (SYNTHROID) 75 MCG tablet Take 1 tablet (75 mcg total) by mouth daily before breakfast. 04/14/19   Reed, Tiffany L, DO  losartan (COZAAR) 100 MG tablet Take 1 tablet (100 mg total) by mouth daily. 04/05/19   Mack Hook, MD  metFORMIN (GLUCOPHAGE-XR) 500 MG 24 hr tablet 1 tab by mouth twice daily with meals 05/15/19   Reed, Tiffany L, DO  Multiple Vitamin (MULTIVITAMIN WITH MINERALS) TABS tablet Take 1 tablet by mouth daily.    [provider]  Omega-3 Fatty Acids (FISH OIL) 1000 MG CAPS Take 1,000 mg by mouth daily.     [provider]  pravastatin (PRAVACHOL) 20 MG tablet Take 1 tablet (20 mg total) by mouth daily at 6 PM. 05/07/19   Denton Brick, Courage, MD  traZODone (DESYREL) 100 MG tablet Take 1 tablet (100 mg total) by mouth at bedtime as needed for sleep. 06/12/19   Gayland Curry, DO    Family History Family History  Problem Relation Age of Onset  . Heart disease Mother        unclear details "atherosclerosis"  . Diabetes Mother   . Alcohol abuse Sister   . Diabetes Brother   . Diabetes Brother   . Drug abuse Brother   . Diabetes Daughter   . Heart disease Son        CHF  . Alcohol abuse Son     Social History Social History   Tobacco Use  . Smoking status: Former Research scientist (life sciences)  . Smokeless tobacco: Never Used  . Tobacco comment: No smoking since 79 yo - smoked 10 yrs  Substance Use Topics  . Alcohol use: No  . Drug use: No      Allergies   Clindamycin hcl, Sulfamethoxazole-trimethoprim, Zanaflex [tizanidine hcl], Metronidazole, Statins, Ace inhibitors, and Adhesive [tape]   Review of Systems Review of Systems   Physical Exam Triage Vital Signs ED Triage Vitals  Enc Vitals Group     BP 06/18/19 1708 131/82     Pulse Rate 06/18/19 1708 76     Resp 06/18/19 1708 18     Temp 06/18/19 1708 98.7 F (37.1 C)     Temp Source 06/18/19 1708 Oral     SpO2 06/18/19 1708 98 %     Weight --      Height --      Head Circumference --      Peak Flow --      Pain  Score 06/18/19 1706 0     Pain Loc --      Pain Edu? --      Excl. in [place]? --    No data found.  Updated Vital Signs BP 131/82 (BP Location: Left Arm)   Pulse 76   Temp 98.7 F (37.1 C) (Oral)   Resp 18   SpO2 98%   Visual Acuity Right Eye Distance:   Left Eye Distance:   Bilateral Distance:    Right Eye Near:   Left Eye Near:    Bilateral Near:     Physical Exam Vitals and nursing note reviewed.  Constitutional:      General: She is not in acute distress.    Appearance: Normal appearance. She is not ill-appearing, toxic-appearing or diaphoretic.  HENT:     Head: Normocephalic.     Nose: Nose normal.  Eyes:     Conjunctiva/sclera: Conjunctivae normal.  Pulmonary:     Effort: Pulmonary effort is normal.  Musculoskeletal:        General: Normal range of motion.     Cervical back: Normal range of motion.  Skin:    General: Skin is warm and dry.     Findings: No rash.  Neurological:     Mental Status: She is alert. Mental status is at baseline.  Psychiatric:        Mood and Affect: Mood normal.        Behavior: Behavior normal.      UC Treatments / Results  Labs (all labs ordered are listed, but only abnormal results are displayed) Labs Reviewed  URINE CULTURE - Abnormal; Notable for the following components:      Result Value   Culture >=100,000 COLONIES/mL ESCHERICHIA COLI (*)    Organism ID, Bacteria ESCHERICHIA  COLI (*)    All other components within normal limits  POCT URINALYSIS DIP (DEVICE) - Abnormal; Notable for the following components:   Hgb urine dipstick TRACE (*)    Leukocytes,Ua SMALL (*)    All other components within normal limits  NOVEL CORONAVIRUS, NAA (HOSP ORDER, SEND-OUT TO REF LAB; TAT 18-24 HRS)    EKG   Radiology No results found.  Procedures Procedures (including critical care time)  Medications Ordered in UC Medications - No data to display  Initial Impression / Assessment and Plan / UC Course  I have reviewed the triage vital signs and the nursing notes.  Pertinent labs & imaging results that were available during my care of the patient were reviewed by me and considered in my medical decision making (see chart for details).     Lower urinary tract infection- treating with Keflex Recommended push fluids Recommend follow-up with urology for recurrent urinary tract infections. Covid swab done and sent off for assisted living admission Final Clinical Impressions(s) / UC Diagnoses   Final diagnoses:  Lower urinary tract infectious disease     Discharge Instructions     Treating for urinary tract infection This seems to be a reoccurring issue. You may need to see a urologist for follow-up Covid test done for assisted living Follow up as needed for continued or worsening symptoms     ED Prescriptions    Medication Sig Dispense Auth. Provider   cephALEXin (KEFLEX) 500 MG capsule Take 1 capsule (500 mg total) by mouth 2 (two) times daily for 7 days. 14 capsule Beryl Hornberger A, NP     PDMP not reviewed this encounter.   Loura Halt A, NP 06/20/19 1038

## 2019-06-21 ENCOUNTER — Ambulatory Visit (INDEPENDENT_AMBULATORY_CARE_PROVIDER_SITE_OTHER): Payer: Medicare Other

## 2019-06-21 ENCOUNTER — Other Ambulatory Visit: Payer: Self-pay

## 2019-06-21 DIAGNOSIS — Z111 Encounter for screening for respiratory tuberculosis: Secondary | ICD-10-CM

## 2019-06-23 ENCOUNTER — Telehealth: Payer: Self-pay | Admitting: *Deleted

## 2019-06-23 ENCOUNTER — Ambulatory Visit: Payer: Medicare Other

## 2019-06-23 ENCOUNTER — Ambulatory Visit (INDEPENDENT_AMBULATORY_CARE_PROVIDER_SITE_OTHER): Payer: Medicare Other | Admitting: *Deleted

## 2019-06-23 ENCOUNTER — Other Ambulatory Visit: Payer: Self-pay

## 2019-06-23 VITALS — HR 76 | Temp 98.1°F

## 2019-06-23 DIAGNOSIS — Z111 Encounter for screening for respiratory tuberculosis: Secondary | ICD-10-CM

## 2019-06-23 LAB — TB SKIN TEST
Induration: 0 mm
TB Skin Test: NEGATIVE

## 2019-06-23 NOTE — Telephone Encounter (Signed)
Daughter calling asking for a new FL-2 form, pt came in today to have PPD read, she also states that memory care was checked on the FL-2 and per daughter pt doesn't need memory care, daughter would also like the medication for UTI added to the FL-2. The FL-2 can be faxed to [phone]

## 2019-06-23 NOTE — Telephone Encounter (Signed)
Ok, we can redo the FL-2.  Are they placing her in AL rather than memory care?  We need to check the right box.  Also please add the "urine pill" to it.  I will sign when completed.

## 2019-06-23 NOTE — Telephone Encounter (Signed)
New FL-2 form faxed, per [facility]

## 2019-06-24 ENCOUNTER — Emergency Department (HOSPITAL_COMMUNITY): Payer: Medicare Other

## 2019-06-24 ENCOUNTER — Encounter (HOSPITAL_COMMUNITY): Payer: Self-pay | Admitting: *Deleted

## 2019-06-24 ENCOUNTER — Other Ambulatory Visit: Payer: Self-pay

## 2019-06-24 ENCOUNTER — Inpatient Hospital Stay (HOSPITAL_COMMUNITY)
Admission: EM | Admit: 2019-06-24 | Discharge: 2019-06-28 | DRG: 884 | Disposition: A | Discharge status: Home / Self Care | Payer: Medicare Other | Source: Skilled Nursing Facility | Attending: Internal Medicine | Admitting: Internal Medicine

## 2019-06-24 DIAGNOSIS — R443 Hallucinations, unspecified: Secondary | ICD-10-CM | POA: Diagnosis not present

## 2019-06-24 DIAGNOSIS — F0151 Vascular dementia with behavioral disturbance: Principal | ICD-10-CM | POA: Diagnosis present

## 2019-06-24 DIAGNOSIS — Z8673 Personal history of transient ischemic attack (TIA), and cerebral infarction without residual deficits: Secondary | ICD-10-CM | POA: Diagnosis not present

## 2019-06-24 DIAGNOSIS — R4182 Altered mental status, unspecified: Secondary | ICD-10-CM | POA: Diagnosis present

## 2019-06-24 DIAGNOSIS — Z79890 Hormone replacement therapy (postmenopausal): Secondary | ICD-10-CM

## 2019-06-24 DIAGNOSIS — Z87891 Personal history of nicotine dependence: Secondary | ICD-10-CM | POA: Diagnosis not present

## 2019-06-24 DIAGNOSIS — N390 Urinary tract infection, site not specified: Secondary | ICD-10-CM | POA: Diagnosis present

## 2019-06-24 DIAGNOSIS — M549 Dorsalgia, unspecified: Secondary | ICD-10-CM | POA: Diagnosis present

## 2019-06-24 DIAGNOSIS — R410 Disorientation, unspecified: Secondary | ICD-10-CM | POA: Diagnosis not present

## 2019-06-24 DIAGNOSIS — Z79899 Other long term (current) drug therapy: Secondary | ICD-10-CM

## 2019-06-24 DIAGNOSIS — Z882 Allergy status to sulfonamides status: Secondary | ICD-10-CM | POA: Diagnosis not present

## 2019-06-24 DIAGNOSIS — Z79891 Long term (current) use of opiate analgesic: Secondary | ICD-10-CM

## 2019-06-24 DIAGNOSIS — Z781 Physical restraint status: Secondary | ICD-10-CM

## 2019-06-24 DIAGNOSIS — F0150 Vascular dementia without behavioral disturbance: Secondary | ICD-10-CM | POA: Diagnosis not present

## 2019-06-24 DIAGNOSIS — G9341 Metabolic encephalopathy: Secondary | ICD-10-CM | POA: Diagnosis present

## 2019-06-24 DIAGNOSIS — Z881 Allergy status to other antibiotic agents status: Secondary | ICD-10-CM

## 2019-06-24 DIAGNOSIS — Z91048 Other nonmedicinal substance allergy status: Secondary | ICD-10-CM | POA: Diagnosis not present

## 2019-06-24 DIAGNOSIS — Z888 Allergy status to other drugs, medicaments and biological substances status: Secondary | ICD-10-CM | POA: Diagnosis not present

## 2019-06-24 DIAGNOSIS — Z91030 Bee allergy status: Secondary | ICD-10-CM | POA: Diagnosis not present

## 2019-06-24 DIAGNOSIS — Z8249 Family history of ischemic heart disease and other diseases of the circulatory system: Secondary | ICD-10-CM

## 2019-06-24 DIAGNOSIS — E039 Hypothyroidism, unspecified: Secondary | ICD-10-CM | POA: Diagnosis present

## 2019-06-24 DIAGNOSIS — Z9049 Acquired absence of other specified parts of digestive tract: Secondary | ICD-10-CM | POA: Diagnosis not present

## 2019-06-24 DIAGNOSIS — Z20822 Contact with and (suspected) exposure to covid-19: Secondary | ICD-10-CM | POA: Diagnosis present

## 2019-06-24 DIAGNOSIS — G8929 Other chronic pain: Secondary | ICD-10-CM | POA: Diagnosis present

## 2019-06-24 DIAGNOSIS — Z7984 Long term (current) use of oral hypoglycemic drugs: Secondary | ICD-10-CM

## 2019-06-24 DIAGNOSIS — Z9119 Patient's noncompliance with other medical treatment and regimen: Secondary | ICD-10-CM

## 2019-06-24 DIAGNOSIS — Z811 Family history of alcohol abuse and dependence: Secondary | ICD-10-CM

## 2019-06-24 DIAGNOSIS — F05 Delirium due to known physiological condition: Secondary | ICD-10-CM | POA: Diagnosis present

## 2019-06-24 DIAGNOSIS — E782 Mixed hyperlipidemia: Secondary | ICD-10-CM | POA: Diagnosis not present

## 2019-06-24 DIAGNOSIS — B9620 Unspecified Escherichia coli [E. coli] as the cause of diseases classified elsewhere: Secondary | ICD-10-CM | POA: Diagnosis present

## 2019-06-24 DIAGNOSIS — E119 Type 2 diabetes mellitus without complications: Secondary | ICD-10-CM | POA: Diagnosis present

## 2019-06-24 DIAGNOSIS — J189 Pneumonia, unspecified organism: Secondary | ICD-10-CM | POA: Diagnosis not present

## 2019-06-24 DIAGNOSIS — N3000 Acute cystitis without hematuria: Secondary | ICD-10-CM | POA: Diagnosis not present

## 2019-06-24 DIAGNOSIS — Z90710 Acquired absence of both cervix and uterus: Secondary | ICD-10-CM

## 2019-06-24 DIAGNOSIS — Z833 Family history of diabetes mellitus: Secondary | ICD-10-CM

## 2019-06-24 DIAGNOSIS — F0390 Unspecified dementia without behavioral disturbance: Secondary | ICD-10-CM | POA: Diagnosis present

## 2019-06-24 DIAGNOSIS — I10 Essential (primary) hypertension: Secondary | ICD-10-CM | POA: Diagnosis not present

## 2019-06-24 DIAGNOSIS — Z813 Family history of other psychoactive substance abuse and dependence: Secondary | ICD-10-CM

## 2019-06-24 DIAGNOSIS — K5900 Constipation, unspecified: Secondary | ICD-10-CM | POA: Diagnosis present

## 2019-06-24 DIAGNOSIS — Z03818 Encounter for observation for suspected exposure to other biological agents ruled out: Secondary | ICD-10-CM | POA: Diagnosis not present

## 2019-06-24 LAB — URINALYSIS, ROUTINE W REFLEX MICROSCOPIC
Bilirubin Urine: NEGATIVE
Glucose, UA: 50 mg/dL — AB
Hgb urine dipstick: NEGATIVE
Ketones, ur: NEGATIVE mg/dL
Nitrite: NEGATIVE
Protein, ur: NEGATIVE mg/dL
Specific Gravity, Urine: 1.012 (ref 1.005–1.030)
pH: 7 (ref 5.0–8.0)

## 2019-06-24 LAB — COMPREHENSIVE METABOLIC PANEL
ALT: 12 U/L (ref 0–44)
AST: 22 U/L (ref 15–41)
Albumin: 3.9 g/dL (ref 3.5–5.0)
Alkaline Phosphatase: 49 U/L (ref 38–126)
Anion gap: 9 (ref 5–15)
BUN: 10 mg/dL (ref 8–23)
CO2: 25 mmol/L (ref 22–32)
Calcium: 9.5 mg/dL (ref 8.9–10.3)
Chloride: 104 mmol/L (ref 98–111)
Creatinine, Ser: 0.71 mg/dL (ref 0.44–1.00)
GFR calc Af Amer: >60 mL/min (ref >60)
GFR calc non Af Amer: >60 mL/min (ref >60)
Glucose, Bld: 117 mg/dL — ABNORMAL HIGH (ref 70–99)
Potassium: 3.9 mmol/L (ref 3.5–5.1)
Sodium: 138 mmol/L (ref 135–145)
Total Bilirubin: 0.6 mg/dL (ref 0.3–1.2)
Total Protein: 7.3 g/dL (ref 6.5–8.1)

## 2019-06-24 LAB — CBC WITH DIFFERENTIAL/PLATELET
Abs Immature Granulocytes: 0.01 10*3/uL (ref 0.00–0.07)
Basophils Absolute: 0 10*3/uL (ref 0.0–0.1)
Basophils Relative: 1 %
Eosinophils Absolute: 0 10*3/uL (ref 0.0–0.5)
Eosinophils Relative: 1 %
HCT: 37.1 % (ref 36.0–46.0)
Hemoglobin: 12.3 g/dL (ref 12.0–15.0)
Immature Granulocytes: 0 %
Lymphocytes Relative: 23 %
Lymphs Abs: 1 10*3/uL (ref 0.7–4.0)
MCH: 30 pg (ref 26.0–34.0)
MCHC: 33.2 g/dL (ref 30.0–36.0)
MCV: 90.5 fL (ref 80.0–100.0)
Monocytes Absolute: 0.4 10*3/uL (ref 0.1–1.0)
Monocytes Relative: 8 %
Neutro Abs: 2.9 10*3/uL (ref 1.7–7.7)
Neutrophils Relative %: 67 %
Platelets: 265 10*3/uL (ref 150–400)
RBC: 4.1 MIL/uL (ref 3.87–5.11)
RDW: 14.8 % (ref 11.5–15.5)
WBC: 4.4 10*3/uL (ref 4.0–10.5)
nRBC: 0 % (ref 0.0–0.2)

## 2019-06-24 MED ORDER — SODIUM CHLORIDE 0.9 % IV SOLN
1.0000 g | INTRAVENOUS | Status: DC
  Administered 2019-06-25 – 2019-06-27 (×3): 1 g via INTRAVENOUS
  Filled 2019-06-24 (×2): qty 1
  Filled 2019-06-24: qty 10
  Filled 2019-06-24: qty 1

## 2019-06-24 MED ORDER — ONDANSETRON HCL 4 MG PO TABS: 4.0000 mg | ORAL_TABLET | Freq: Four times a day (QID) | ORAL | Status: DC | PRN

## 2019-06-24 MED ORDER — PRAVASTATIN SODIUM 20 MG PO TABS
20.0000 mg | ORAL_TABLET | Freq: Every day | ORAL | Status: DC
  Administered 2019-06-25 – 2019-06-27 (×3): 20 mg via ORAL
  Filled 2019-06-24 (×2): qty 1

## 2019-06-24 MED ORDER — POLYETHYLENE GLYCOL 3350 17 G PO PACK
17.0000 g | PACK | Freq: Every day | ORAL | Status: DC | PRN
  Administered 2019-06-25: 17 g via ORAL
  Filled 2019-06-24: qty 1

## 2019-06-24 MED ORDER — LEVOTHYROXINE SODIUM 75 MCG PO TABS
75.0000 ug | ORAL_TABLET | Freq: Every day | ORAL | Status: DC
  Administered 2019-06-27 – 2019-06-28 (×2): 75 ug via ORAL
  Filled 2019-06-24 (×3): qty 1

## 2019-06-24 MED ORDER — LATANOPROST 0.005 % OP SOLN
1.0000 [drp] | Freq: Every day | OPHTHALMIC | Status: DC
  Administered 2019-06-26 – 2019-06-27 (×2): 1 [drp] via OPHTHALMIC
  Filled 2019-06-24: qty 2.5

## 2019-06-24 MED ORDER — LOSARTAN POTASSIUM 50 MG PO TABS
100.0000 mg | ORAL_TABLET | Freq: Every day | ORAL | Status: DC
  Administered 2019-06-25 – 2019-06-28 (×4): 100 mg via ORAL
  Filled 2019-06-24 (×4): qty 2

## 2019-06-24 MED ORDER — ACETAMINOPHEN 325 MG PO TABS
650.0000 mg | ORAL_TABLET | Freq: Four times a day (QID) | ORAL | Status: DC | PRN
  Administered 2019-06-26 – 2019-06-27 (×2): 650 mg via ORAL
  Filled 2019-06-24 (×2): qty 2

## 2019-06-24 MED ORDER — ENOXAPARIN SODIUM 40 MG/0.4ML ~~LOC~~ SOLN
40.0000 mg | SUBCUTANEOUS | Status: DC
  Administered 2019-06-25 – 2019-06-27 (×3): 40 mg via SUBCUTANEOUS
  Filled 2019-06-24 (×3): qty 0.4

## 2019-06-24 MED ORDER — ACETAMINOPHEN 650 MG RE SUPP: 650.0000 mg | Freq: Four times a day (QID) | RECTAL | Status: DC | PRN

## 2019-06-24 MED ORDER — SODIUM CHLORIDE 0.9 % IV SOLN
1.0000 g | Freq: Once | INTRAVENOUS | Status: AC
  Administered 2019-06-24: 1 g via INTRAVENOUS
  Filled 2019-06-24: qty 10

## 2019-06-24 MED ORDER — SODIUM CHLORIDE 0.9 % IV SOLN: INTRAVENOUS | Status: DC

## 2019-06-24 MED ORDER — BISACODYL 10 MG RE SUPP
10.0000 mg | Freq: Every day | RECTAL | Status: DC | PRN
  Administered 2019-06-25: 10 mg via RECTAL
  Filled 2019-06-24: qty 1

## 2019-06-24 MED ORDER — AMLODIPINE BESYLATE 5 MG PO TABS
5.0000 mg | ORAL_TABLET | Freq: Every day | ORAL | Status: DC
  Administered 2019-06-25 – 2019-06-28 (×4): 5 mg via ORAL
  Filled 2019-06-24 (×4): qty 1

## 2019-06-24 MED ORDER — ONDANSETRON HCL 4 MG/2ML IJ SOLN: 4.0000 mg | Freq: Four times a day (QID) | INTRAMUSCULAR | Status: DC | PRN

## 2019-06-24 NOTE — ED Notes (Signed)
Pt up walking to the bathroom. Report given. Will transport pt to the floor.

## 2019-06-24 NOTE — Progress Notes (Signed)
2030: Pt admitted to floor. Oriented to room, educated on plan of care. Also explained the function of the call bed and instructed to not get out of bed until help comes to bedside. Bed alarm set at this time. Pt initially verbalized understanding.  Wishing to speak with daughter [name], pt worried about how she was going to get home, stating "they are waiting for me upstairs, I need to get to [facility]"  RN explained to pt many times that she will at least stay the night to get her necessary care. Pt verbalizes understanding at this time and remains on the phone with daughter Lattie Haw.  2050: RN exited the room to get medications. Call light rang out pt found to be getting out of bed with bed alarm on saying that she had to use the bathroom. At this time pt stating, " I didn't come here for this, let me leave, I am not supposed to be staying here, they are waiting for me"  Attempt to redirect pt and explain why she was brought to the hospital, placed on [facility].  At this time pt stating to take everything off, she is leaving, multiple attempts to redirect patient. Pt refuses to listen and insists on leaving, requesting to have clothes, wishing to speak with doctor because pt believes they should not be here.   Assistance called to room as pt refused to get back in bed. Charge RN to bedside and MD paged @ 2055.  Returned page at 2101, explained situation and new orders placed.   At this time pt became aggressive, would not follow verbal redirection and attempts to swing telemetry box at writing RN and charge RN and throw personal cell phone.  Multiple attempts at this time to redirect pt unsuccessful. Refusing to sit in bed to allow Rn to carry out necessary orders.   2124: Security called to bedside to assist with application of restraints. Explained to pt why they were being placed in restraints, discontinuation criteria explained as well, multiple times, however refusing to understand.   Restraints  placed properly. Safety sitter at bedside. Pt still remains confused and uncooperative. No needs or concerns addressed at this time.   2135: Pt daughter Levada Dy notified of what is going on with pt and agrees and verbalizes understanding. No further needs or concerns expressed at this time.   Pt remains in NAD, VSS.  Will continue to assess and monitor pt.

## 2019-06-24 NOTE — ED Provider Notes (Signed)
[place] DEPT Provider Note   CSN: [id number] Arrival date & time: 06/24/19  1354     History Chief Complaint  Patient presents with  . Urinary Tract Infection  . Altered Mental Status  . Hallucinations    Joy Ward is a 79 y.o. female.  HPI      79 year old female, with a PMH of diabetes, HTN, HLD, recently diagnosed UTI on Keflex, presents with hallucinations for the last 3 days.  Per family at bedside patient has had increased confusion over the last 3 days.  Daughter states that patient has been thinking that she is constantly walking and that she is in [place].  Daughter notes that it has been difficult to give the patient her Keflex as patient has an ophthalmology to take it.  Yesterday patient was moved to assisted living facility.  Patient alert and oriented to person and place.  She is complaining of some mild suprapubic abdominal pain.  Daughter notes increased urination.  Patient denies any chest pain, shortness of breath, fevers, chills, nausea, vomiting.   Past Medical History:  Diagnosis Date  . Allergy   . Arthritis   . Chronic back pain   . DDD (degenerative disc disease), lumbar   . Diabetes mellitus without complication ([place])   . Diabetes type 2, uncontrolled ([place]) 09/18/2006   Annotation: Noninsulin dependent Qualifier: Diagnosis of  By: Amil Amen MD, Benjamine Mola    . Former tobacco use   . Hyperlipidemia    a. H/o muscle aches with statins per PCP notes.  . Hypertension   . Hypothyroidism    a. prior thyroid removal.  . Memory loss or impairment 03/29/2019  . Stroke [facility])    a. Listed in PCP notes: "History of stroke:  History of terrible headache.  Had a CT scan of brain and found "ministroke"  Was removed from ERT subsequently. "    Patient Active Problem List   Diagnosis Date Noted  . Altered mental status   . UTI (urinary tract infection) 04/30/2019  . Dementia without behavioral disturbance ([place])   .  Vascular dementia with behavior disturbance ([place]) 04/14/2019  . Primary insomnia 04/14/2019  . Hypothyroidism 04/14/2019  . Diabetes mellitus ([place]) 04/14/2019  . Memory loss or impairment 03/29/2019  . Diabetic peripheral neuropathy associated with type 2 diabetes mellitus ([place]) 12/29/2017  . Peripheral edema 12/29/2017  . Elevated liver enzymes 10/15/2016  . Chest pain 04/22/2016  . Systolic murmur [id number]  . Hypercalcemia 04/22/2016  . Hyperlipidemia 04/22/2016  . Baker's cyst of knee, right 04/21/2016  . HYPOKALEMIA 01/24/2010  . CONSTIPATION 12/23/2009  . INTERMITTENT VERTIGO 12/23/2009  . SINUSITIS, ACUTE 04/25/2009  . VAGINITIS, CANDIDAL 02/14/2009  . HIP PAIN, RIGHT 02/14/2009  . HYPERLIPIDEMIA, MIXED 01/01/2009  . BACK PAIN, LUMBAR, WITH RADICULOPATHY 12/25/2008  . KNEE PAIN, BILATERAL 12/18/2008  . TROCHANTERIC BURSITIS, BILATERAL 12/18/2008  . Disturbance in sleep behavior 12/18/2008  . ONYCHOMYCOSIS, TOENAILS 10/30/2008  . ENTERITIS, CLOSTRIDIUM DIFFICILE 10/15/2006  . Diabetes type 2, uncontrolled ([place]) 09/18/2006  . Essential hypertension 09/18/2006  . Allergic rhinitis 09/18/2006  . GOITER, MULTINODULAR 06/29/2006  . STROKE 04/27/2001    Past Surgical History:  Procedure Laterality Date  . ABDOMINAL HYSTERECTOMY  1994   TAH, not clear if unilateral oophorectomy as well.  [facility] [place]  . CHOLECYSTECTOMY  1973   open  . DECOMPRESSIVE LUMBAR LAMINECTOMY LEVEL 4  2005   In [place]  . THYROIDECTOMY  2012   multinodular  goiter     OB History   No obstetric history on file.     Family History  Problem Relation Age of Onset  . Heart disease Mother        unclear details "atherosclerosis"  . Diabetes Mother   . Alcohol abuse Sister   . Diabetes Brother   . Diabetes Brother   . Drug abuse Brother   . Diabetes Daughter   . Heart disease Son        CHF  . Alcohol abuse Son     Social History   Tobacco Use  . Smoking  status: Former Research scientist (life sciences)  . Smokeless tobacco: Never Used  . Tobacco comment: No smoking since 79 yo - smoked 10 yrs  Substance Use Topics  . Alcohol use: No  . Drug use: No    Home Medications Prior to Admission medications   Medication Sig Start Date End Date Taking? Authorizing Provider  amLODipine (NORVASC) 5 MG tablet Take 1 tablet (5 mg total) by mouth daily. 04/05/19   Mack Hook, MD  bimatoprost (LUMIGAN) 0.01 % SOLN Place 1 drop into the left eye at bedtime. 03/04/18   Mack Hook, MD  cephALEXin (KEFLEX) 500 MG capsule Take 1 capsule (500 mg total) by mouth 2 (two) times daily for 7 days. 06/18/19 06/25/19  Loura Halt A, NP  diclofenac sodium (VOLTAREN) 1 % GEL Apply 4 g topically 2 (two) times daily as needed (pain). 03/04/18   Mack Hook, MD  donepezil (ARICEPT) 5 MG tablet Take 1 tablet (5 mg total) by mouth at bedtime. 04/05/19   Mack Hook, MD  furosemide (LASIX) 20 MG tablet Take 1 tablet (20 mg total) by mouth daily. 05/15/19   Reed, Tiffany L, DO  HYDROcodone-acetaminophen (NORCO) 10-325 MG tablet Take 1 tablet by mouth every 6 (six) hours as needed for moderate pain or severe pain. 05/07/19   Roxan Hockey, MD  levothyroxine (SYNTHROID) 75 MCG tablet Take 1 tablet (75 mcg total) by mouth daily before breakfast. 04/14/19   Reed, Tiffany L, DO  losartan (COZAAR) 100 MG tablet Take 1 tablet (100 mg total) by mouth daily. 04/05/19   Mack Hook, MD  metFORMIN (GLUCOPHAGE-XR) 500 MG 24 hr tablet 1 tab by mouth twice daily with meals 05/15/19   Reed, Tiffany L, DO  Multiple Vitamin (MULTIVITAMIN WITH MINERALS) TABS tablet Take 1 tablet by mouth daily.    [provider]  Omega-3 Fatty Acids (FISH OIL) 1000 MG CAPS Take 1,000 mg by mouth daily.     [provider]  pravastatin (PRAVACHOL) 20 MG tablet Take 1 tablet (20 mg total) by mouth daily at 6 PM. 05/07/19   Denton Brick, Courage, MD  traZODone (DESYREL) 100 MG tablet Take 1  tablet (100 mg total) by mouth at bedtime as needed for sleep. 06/12/19   Reed, Tiffany L, DO    Allergies    Clindamycin hcl, Sulfamethoxazole-trimethoprim, Zanaflex [tizanidine hcl], Metronidazole, Statins, Bee venom, Ace inhibitors, and Adhesive [tape]  Review of Systems   Review of Systems  Constitutional: Negative for chills and fever.  Respiratory: Negative for shortness of breath.   Cardiovascular: Negative for chest pain.  Gastrointestinal: Positive for abdominal pain. Negative for nausea and vomiting.  Genitourinary: Positive for frequency. Negative for dysuria.  All other systems reviewed and are negative.   Physical Exam Updated Vital Signs BP 127/65 (BP Location: Right Arm)   Pulse 90   Temp 97.7 F (36.5 C) (Oral)   Resp 18  Ht 5\' 4"  (1.626 m)   Wt 72.1 kg   SpO2 99%   BMI 27.29 kg/m   Physical Exam Vitals and nursing note reviewed.  Constitutional:      Appearance: She is well-developed.  HENT:     Head: Normocephalic and atraumatic.  Eyes:     Conjunctiva/sclera: Conjunctivae normal.  Cardiovascular:     Rate and Rhythm: Normal rate and regular rhythm.     Heart sounds: Normal heart sounds. No murmur.  Pulmonary:     Effort: Pulmonary effort is normal. No respiratory distress.     Breath sounds: Normal breath sounds. No wheezing or rales.  Abdominal:     General: Bowel sounds are normal. There is no distension.     Palpations: Abdomen is soft.     Tenderness: There is abdominal tenderness in the suprapubic area. Negative signs include Murphy's sign and McBurney's sign.  Musculoskeletal:        General: No tenderness or deformity. Normal range of motion.     Cervical back: Neck supple.  Skin:    General: Skin is warm and dry.     Findings: No erythema or rash.  Neurological:     Mental Status: She is alert. She is disoriented.     Motor: No weakness.  Psychiatric:        Behavior: Behavior normal.     ED Results / Procedures / Treatments     Labs (all labs ordered are listed, but only abnormal results are displayed) Labs Reviewed  URINE CULTURE  URINALYSIS, ROUTINE W REFLEX MICROSCOPIC  CBC WITH DIFFERENTIAL/PLATELET  COMPREHENSIVE METABOLIC PANEL    EKG None  Radiology No results found.  Procedures Procedures (including critical care time)  Medications Ordered in ED Medications - No data to display  ED Course  I have reviewed the triage vital signs and the nursing notes.  Pertinent labs & imaging results that were available during my care of the patient were reviewed by me and considered in my medical decision making (see chart for details).    MDM Rules/Calculators/A&P                       Patient presents with confusion.  She does have a UTI diagnosed on 2/21.  She was started on Keflex.  However family notes patient has been noncompliant.  Over the last few days she has had increasing confusion.  Patient calm and cooperative.  Physical exam unremarkable.  Patient is oriented to person and place only.  Vital signs stable.  Patient afebrile, not tachycardic.  White count normal.  Chest x-ray clear, no pneumonia/pneumothorax/pleural effusion.  Urine with some bacteria noted.  For culture and treat with ceftriaxone at this time.  Discussed with attending, Dr. Wyvonnia Dusky who is agreeable with admission.  Discussed with the hospitalist, Dr. Louanne Belton who will see the patient.   Joy Ward was evaluated in Emergency Department on 06/24/2019 for the symptoms described in the history of present illness. She was evaluated in the context of the global COVID-19 pandemic, which necessitated consideration that the patient might be at risk for infection with the SARS-CoV-2 virus that causes COVID-19. Institutional protocols and algorithms that pertain to the evaluation of patients at risk for COVID-19 are in a state of rapid change based on information released by regulatory bodies including the CDC and federal and state  organizations. These policies and algorithms were followed during the patient's care in the ED.     Final  Clinical Impression(s) / ED Diagnoses Final diagnoses:  None    Rx / DC Orders ED Discharge Orders    None       Rachel Moulds 06/24/19 2112    Ezequiel Essex, MD 06/24/19 2354

## 2019-06-24 NOTE — H&P (Signed)
[facility] History and Physical  Joy Ward [id number] DOB: 08-29-1940 DOA: 06/24/2019  Referring physician: ED  PCP: Gayland Curry, DO   Patient is coming from: ALF  Chief Complaint: Altered mental status, hallucinations  HPI: Joy Ward is a 79 y.o. female with the past medical history of hypertension, hyperlipidemia, diabetes mellitus type 2, history of stroke, mild dementia currently at assisted living facility (just moved yesterday) was brought into the hospital with complaints of altered mental status confusion and hallucinations.  Spoke with the patient's daughter [name] who stated that patient did not know where she was and thought she was walking while she was not and thought she was in [place] while she was not.  She was also noncompliant to her antibiotic regimen that was prescribed for her UTI.  Patient had presented to the ED on 21st when she was given Keflex for UTI.  Urine culture grew E. coli at that time.  Patient however denies any fever chills or rigor.  She did have some urinary urgency recently but denies any dysuria.  Denies nausea vomiting or abdominal pain.  Denies any shortness of breath cough fever chills..  Patient was then brought to the hospital today by her daughter for further evaluation and treatment.    ED Course: In the ED, patient was noted to be slightly confused urine analysis was mildly abnormal.  Patient was then considered for admission to the hospital for metabolic encephalopathy and hallucinations, UTI with failed outpatient treatment.  Review of Systems:  All systems were reviewed and were negative unless otherwise mentioned in the HPI  Past Medical History:  Diagnosis Date  . Allergy   . Arthritis   . Chronic back pain   . DDD (degenerative disc disease), lumbar   . Diabetes mellitus without complication ([place])   . Diabetes type 2, uncontrolled ([place]) 09/18/2006   Annotation: Noninsulin dependent Qualifier: Diagnosis of  By:  Amil Amen MD, Benjamine Mola    . Former tobacco use   . Hyperlipidemia    a. H/o muscle aches with statins per PCP notes.  . Hypertension   . Hypothyroidism    a. prior thyroid removal.  . Memory loss or impairment 03/29/2019  . Stroke [facility])    a. Listed in PCP notes: "History of stroke:  History of terrible headache.  Had a CT scan of brain and found "ministroke"  Was removed from ERT subsequently. "   Past Surgical History:  Procedure Laterality Date  . ABDOMINAL HYSTERECTOMY  1994   TAH, not clear if unilateral oophorectomy as well.  [facility]land Kitchen [place]  . CHOLECYSTECTOMY  1973   open  . DECOMPRESSIVE LUMBAR LAMINECTOMY LEVEL 4  2005   In [place]  . THYROIDECTOMY  2012   multinodular goiter    Social History:  reports that she has quit smoking. She has never used smokeless tobacco. She reports that she does not drink alcohol or use drugs.  Allergies  Allergen Reactions  . Clindamycin Hcl Other (See Comments)    REACTION: C. difficile colitis  . Sulfamethoxazole-Trimethoprim Other (See Comments)    Other reaction(s): Eye redness  . Zanaflex [Tizanidine Hcl] Anaphylaxis  . Metronidazole Hives  . Statins Other (See Comments)    Side Effect: muscle pain Has tried Zocor, Crestor--she does not want to take a statin, period  . Bee Venom   . Ace Inhibitors Cough  . Adhesive [Tape] Itching, Rash and Other (See Comments)    Blistering of skin  Family History  Problem Relation Age of Onset  . Heart disease Mother        unclear details "atherosclerosis"  . Diabetes Mother   . Alcohol abuse Sister   . Diabetes Brother   . Diabetes Brother   . Drug abuse Brother   . Diabetes Daughter   . Heart disease Son        CHF  . Alcohol abuse Son      Prior to Admission medications   Medication Sig Start Date End Date Taking? Authorizing Provider  amLODipine (NORVASC) 5 MG tablet Take 1 tablet (5 mg total) by mouth daily. 04/05/19  Yes Mack Hook, MD    bimatoprost (LUMIGAN) 0.01 % SOLN Place 1 drop into the left eye at bedtime. 03/04/18  Yes Mack Hook, MD  cephALEXin (KEFLEX) 500 MG capsule Take 1 capsule (500 mg total) by mouth 2 (two) times daily for 7 days. 06/18/19 06/25/19 Yes Bast, Traci A, NP  diclofenac sodium (VOLTAREN) 1 % GEL Apply 4 g topically 2 (two) times daily as needed (pain). 03/04/18  Yes Mack Hook, MD  donepezil (ARICEPT) 5 MG tablet Take 1 tablet (5 mg total) by mouth at bedtime. 04/05/19  Yes Mack Hook, MD  furosemide (LASIX) 20 MG tablet Take 1 tablet (20 mg total) by mouth daily. 05/15/19  Yes Reed, Tiffany L, DO  HYDROcodone-acetaminophen (NORCO) 10-325 MG tablet Take 1 tablet by mouth every 6 (six) hours as needed for moderate pain or severe pain. 05/07/19  Yes Roxan Hockey, MD  levothyroxine (SYNTHROID) 75 MCG tablet Take 1 tablet (75 mcg total) by mouth daily before breakfast. 04/14/19  Yes Reed, Tiffany L, DO  losartan (COZAAR) 100 MG tablet Take 1 tablet (100 mg total) by mouth daily. 04/05/19  Yes Mack Hook, MD  metFORMIN (GLUCOPHAGE-XR) 500 MG 24 hr tablet 1 tab by mouth twice daily with meals Patient taking differently: Take 500 mg by mouth in the morning and at bedtime. 1 tab by mouth twice daily with meals 05/15/19  Yes Reed, Tiffany L, DO  Multiple Vitamin (MULTIVITAMIN WITH MINERALS) TABS tablet Take 1 tablet by mouth daily.   Yes [provider]  Omega-3 Fatty Acids (FISH OIL) 1000 MG CAPS Take 1,000 mg by mouth daily.    Yes [provider]  pravastatin (PRAVACHOL) 20 MG tablet Take 1 tablet (20 mg total) by mouth daily at 6 PM. 05/07/19  Yes Emokpae, Courage, MD  traZODone (DESYREL) 100 MG tablet Take 1 tablet (100 mg total) by mouth at bedtime as needed for sleep. 06/12/19  Yes Gayland Curry, DO    Physical Exam: Vitals:   06/24/19 1404 06/24/19 1415 06/24/19 1456  BP: 127/65 (!) 156/84 120/72  Pulse: 90 83 76  Resp: 18  (!) 23  Temp: 97.7 F (36.5  C)    TempSrc: Oral    SpO2: 99% 96% 100%  Weight: 72.1 kg    Height: 5\' 4"  (1.626 m)     Wt Readings from Last 3 Encounters:  06/24/19 72.1 kg  05/24/19 68.9 kg  05/22/19 68.9 kg   Body mass index is 27.29 kg/m.  General:  Average built, not in obvious distress, alert awake and communicative, oriented to place and person HENT: Normocephalic, pupils equally reacting to light and accommodation.  No scleral pallor or icterus noted. Oral mucosa is moist.  Chest:  Clear breath sounds.  Diminished breath sounds bilaterally. No crackles or wheezes.  CVS: S1 &S2 heard. No murmur.  Regular rate  and rhythm. Abdomen: Soft, nontender, nondistended.  Bowel sounds are heard. Extremities: No cyanosis, clubbing or edema.  Peripheral pulses are palpable. Psych: Normal mood.  Alert awake oriented to place and person CNS:  No cranial nerve deficits.  Power equal in all extremities.   Skin: Warm and dry.  No rashes noted.  Labs on Admission:   CBC: Recent Labs  Lab 06/24/19 1520  WBC 4.4  NEUTROABS 2.9  HGB 12.3  HCT 37.1  MCV 90.5  PLT 99991111    Basic Metabolic Panel: Recent Labs  Lab 06/24/19 1520  NA 138  K 3.9  CL 104  CO2 25  GLUCOSE 117*  BUN 10  CREATININE 0.71  CALCIUM 9.5    Liver Function Tests: Recent Labs  Lab 06/24/19 1520  AST 22  ALT 12  ALKPHOS 49  BILITOT 0.6  PROT 7.3  ALBUMIN 3.9   No results for input(s): LIPASE, AMYLASE in the last 168 hours. No results for input(s): AMMONIA in the last 168 hours.  Cardiac Enzymes: No results for input(s): CKTOTAL, CKMB, CKMBINDEX, TROPONINI in the last 168 hours.  BNP (last 3 results) No results for input(s): BNP in the last 8760 hours.  ProBNP (last 3 results) No results for input(s): PROBNP in the last 8760 hours.  CBG: No results for input(s): GLUCAP in the last 168 hours.  Lipase     Component Value Date/Time   LIPASE 38 10/17/2016 2247     Urinalysis    Component Value Date/Time   COLORURINE  YELLOW 06/24/2019 1540   APPEARANCEUR CLEAR 06/24/2019 1540   LABSPEC 1.012 06/24/2019 1540   PHURINE 7.0 06/24/2019 1540   GLUCOSEU 50 (A) 06/24/2019 1540   HGBUR NEGATIVE 06/24/2019 1540   HGBUR negative 02/14/2009 0938   BILIRUBINUR NEGATIVE 06/24/2019 1540   KETONESUR NEGATIVE 06/24/2019 1540   PROTEINUR NEGATIVE 06/24/2019 1540   UROBILINOGEN 0.2 06/18/2019 1708   NITRITE NEGATIVE 06/24/2019 1540   LEUKOCYTESUR SMALL (A) 06/24/2019 1540     Drugs of Abuse  No results found for: LABOPIA, COCAINSCRNUR, LABBENZ, AMPHETMU, THCU, LABBARB    Radiological Exams on Admission: DG Chest Portable 1 View  Result Date: 06/24/2019 CLINICAL DATA:  79 year old female with pneumonia EXAM: PORTABLE CHEST 1 VIEW COMPARISON:  04/30/2019 FINDINGS: Cardiomediastinal silhouette unchanged in size and contour. No evidence of central vascular congestion. No pneumothorax or pleural effusion. No confluent airspace disease. Surgical changes of the right neck base. Coarsened interstitial markings.  No displaced fracture IMPRESSION: Chronic lung changes without evidence of acute cardiopulmonary disease Electronically Signed   By: Corrie Mckusick D.O.   On: 06/24/2019 14:51    EKG: Not available for review  Assessment/Plan Principal Problem:   Altered mental status Active Problems:   HYPERLIPIDEMIA, MIXED   Essential hypertension   Hallucinations  Altered mental status with hallucinations likely metabolic encephalopathy.  Patient was inadequately treated for UTI.  Failed outpatient treatment.  Will send urine cultures.  Continue on Rocephin IV.  Will closely monitor.  Patient is currently at her assisted living facility.  Hold Norco.  Gentle IV fluid hydration today.  Inadequately treated UTI. Keflex was prescribed but hasn't taking it as per the patient's daughter.   History of vascular dementia.  Now with hallucinations.  Hold Aricept and trazodone for now.  History of hypothyroidism.  On Synthroid.   Will continue.  Essential hypertension.  Continue amlodipine, losartan.  Hold Lasix today.  Diabetes mellitus type 2.  We will put the patient on sliding  scale insulin, Accu-Cheks diabetic diet as able.  On Metformin at home.  Hyperlipidemia.  Continue pravastatin.  Check CK levels.  History of stroke.  Continue losartan, pravastatin.  DVT Prophylaxis: lovenox subcu  Consultant: None  Code Status: Full code  Microbiology urine culture  Antibiotics: Rocephin IV  Family Communication:  Patients' condition and plan of care including tests being ordered have been discussed with the patient and the patient's  who indicate understanding and agree with the plan.  Disposition Plan: Assisted living facility   Severity of Illness: The appropriate patient status for this patient is OBSERVATION. Observation status is judged to be reasonable and necessary in order to provide the required intensity of service to ensure the patient's safety. The patient's presenting symptoms, physical exam findings, and initial radiographic and laboratory data in the context of their medical condition is felt to place them at decreased risk for further clinical deterioration. Furthermore, it is anticipated that the patient will be medically stable for discharge from the hospital within 2 midnights of admission. The following factors support the patient status of observation.     Signed, Flora Lipps, MD [facility] 06/24/2019

## 2019-06-24 NOTE — ED Triage Notes (Signed)
Daughter states pt went to [facility] last Sunday Started antibiotics on Monday for UTI, since Wednesday has been confused and having halluciinations

## 2019-06-24 NOTE — ED Notes (Signed)
Pt lying in bed awake. Full monitor on. COVID swab performed.

## 2019-06-25 DIAGNOSIS — F0151 Vascular dementia with behavioral disturbance: Secondary | ICD-10-CM | POA: Diagnosis present

## 2019-06-25 DIAGNOSIS — Z91030 Bee allergy status: Secondary | ICD-10-CM | POA: Diagnosis not present

## 2019-06-25 DIAGNOSIS — Z781 Physical restraint status: Secondary | ICD-10-CM | POA: Diagnosis not present

## 2019-06-25 DIAGNOSIS — B9620 Unspecified Escherichia coli [E. coli] as the cause of diseases classified elsewhere: Secondary | ICD-10-CM | POA: Diagnosis present

## 2019-06-25 DIAGNOSIS — Z8673 Personal history of transient ischemic attack (TIA), and cerebral infarction without residual deficits: Secondary | ICD-10-CM | POA: Diagnosis not present

## 2019-06-25 DIAGNOSIS — Z91048 Other nonmedicinal substance allergy status: Secondary | ICD-10-CM | POA: Diagnosis not present

## 2019-06-25 DIAGNOSIS — F0150 Vascular dementia without behavioral disturbance: Secondary | ICD-10-CM | POA: Diagnosis not present

## 2019-06-25 DIAGNOSIS — Z79890 Hormone replacement therapy (postmenopausal): Secondary | ICD-10-CM | POA: Diagnosis not present

## 2019-06-25 DIAGNOSIS — G9341 Metabolic encephalopathy: Secondary | ICD-10-CM | POA: Diagnosis present

## 2019-06-25 DIAGNOSIS — G8929 Other chronic pain: Secondary | ICD-10-CM | POA: Diagnosis present

## 2019-06-25 DIAGNOSIS — Z9049 Acquired absence of other specified parts of digestive tract: Secondary | ICD-10-CM | POA: Diagnosis not present

## 2019-06-25 DIAGNOSIS — K5900 Constipation, unspecified: Secondary | ICD-10-CM | POA: Diagnosis present

## 2019-06-25 DIAGNOSIS — Z888 Allergy status to other drugs, medicaments and biological substances status: Secondary | ICD-10-CM | POA: Diagnosis not present

## 2019-06-25 DIAGNOSIS — M549 Dorsalgia, unspecified: Secondary | ICD-10-CM | POA: Diagnosis present

## 2019-06-25 DIAGNOSIS — F05 Delirium due to known physiological condition: Secondary | ICD-10-CM | POA: Diagnosis present

## 2019-06-25 DIAGNOSIS — Z20822 Contact with and (suspected) exposure to covid-19: Secondary | ICD-10-CM | POA: Diagnosis present

## 2019-06-25 DIAGNOSIS — R443 Hallucinations, unspecified: Secondary | ICD-10-CM | POA: Diagnosis present

## 2019-06-25 DIAGNOSIS — Z87891 Personal history of nicotine dependence: Secondary | ICD-10-CM | POA: Diagnosis not present

## 2019-06-25 DIAGNOSIS — N390 Urinary tract infection, site not specified: Secondary | ICD-10-CM | POA: Diagnosis present

## 2019-06-25 DIAGNOSIS — Z881 Allergy status to other antibiotic agents status: Secondary | ICD-10-CM | POA: Diagnosis not present

## 2019-06-25 DIAGNOSIS — E119 Type 2 diabetes mellitus without complications: Secondary | ICD-10-CM | POA: Diagnosis present

## 2019-06-25 DIAGNOSIS — Z882 Allergy status to sulfonamides status: Secondary | ICD-10-CM | POA: Diagnosis not present

## 2019-06-25 DIAGNOSIS — Z9119 Patient's noncompliance with other medical treatment and regimen: Secondary | ICD-10-CM | POA: Diagnosis not present

## 2019-06-25 DIAGNOSIS — I10 Essential (primary) hypertension: Secondary | ICD-10-CM | POA: Diagnosis present

## 2019-06-25 DIAGNOSIS — E782 Mixed hyperlipidemia: Secondary | ICD-10-CM | POA: Diagnosis present

## 2019-06-25 DIAGNOSIS — R4182 Altered mental status, unspecified: Secondary | ICD-10-CM | POA: Diagnosis not present

## 2019-06-25 LAB — SARS CORONAVIRUS 2 (TAT 6-24 HRS): SARS Coronavirus 2: NEGATIVE

## 2019-06-25 LAB — URINE CULTURE: Culture: <10000 — AB

## 2019-06-25 MED ORDER — TRAZODONE HCL 100 MG PO TABS
100.0000 mg | ORAL_TABLET | Freq: Every evening | ORAL | Status: DC | PRN
  Administered 2019-06-25: 21:00:00 100 mg via ORAL
  Filled 2019-06-25: qty 1

## 2019-06-25 MED ORDER — DONEPEZIL HCL 5 MG PO TABS
5.0000 mg | ORAL_TABLET | Freq: Every day | ORAL | Status: DC
  Administered 2019-06-25 – 2019-06-27 (×3): 5 mg via ORAL
  Filled 2019-06-25 (×3): qty 1

## 2019-06-25 NOTE — Progress Notes (Signed)
PT Cancellation Note  Patient Details Name: Joy Ward MRN: [id number] DOB: 10/04/1940   Cancelled Treatment:    Reason Eval/Treat Not Completed: Attempted PT eval-pt declined to participate at this time. Will likely check back another day.   Doreatha Massed, PT Acute Rehabilitation

## 2019-06-25 NOTE — Consult Note (Signed)
Telepsych Consultation   Reason for Consult:  ''periodic confusion, agitation ?Dementia with psychosis rule out psychotic disorders.'' Referring Physician:  Flora Lipps, MD Location of Patient: [facility] [phone] Location of Provider: [facility]  Patient Identification: Joy Ward MRN:  [id number] Principal Diagnosis: Dementia without behavioral disturbance ([place]) Diagnosis:  Principal Problem:   Dementia without behavioral disturbance ([place]) Active Problems:   HYPERLIPIDEMIA, MIXED   Essential hypertension   Altered mental status   Hallucinations   Metabolic encephalopathy   Total Time spent with patient: 45 minutes  Subjective:   Joy Ward is a 79 y.o. female patient admitted with altered mental status and hallucinations.  HPI:  Patient who denies prior history of mental illness but reports with history of hypertension, hyperlipidemia, diabetes mellitus type 2, history of stroke, dementia, admitted to the hospital with complaints of altered mental status, confusion and hallucinations.Today, patient is alert and oriented to place, person but not to time. She reports being brought to the hospital because she was acting confused yesterday which she attributed to urinary tract infection and inability to move bowel for more than 2 days. Currently, patient is calm, cooperative, reports occasional memory impairment/forgetfulness but denies psychosis, delusions, anxiety or depressive symptom. She is a retired Systems analyst and states that she will be good if she can get a good treatment for constipation and UTI.  Past Psychiatric History: as above  Risk to Self:  denies  Risk to Others:  denies Prior Inpatient Therapy:  denies Prior Outpatient Therapy:  PCP  Past Medical History:  Past Medical History:  Diagnosis Date  . Allergy   . Arthritis   . Chronic back pain   . DDD (degenerative disc disease), lumbar   . Diabetes mellitus without complication ([place])    . Diabetes type 2, uncontrolled ([place]) 09/18/2006   Annotation: Noninsulin dependent Qualifier: Diagnosis of  By: Amil Amen MD, Benjamine Mola    . Former tobacco use   . Hyperlipidemia    a. H/o muscle aches with statins per PCP notes.  . Hypertension   . Hypothyroidism    a. prior thyroid removal.  . Memory loss or impairment 03/29/2019  . Stroke [facility])    a. Listed in PCP notes: "History of stroke:  History of terrible headache.  Had a CT scan of brain and found "ministroke"  Was removed from ERT subsequently. "    Past Surgical History:  Procedure Laterality Date  . ABDOMINAL HYSTERECTOMY  1994   TAH, not clear if unilateral oophorectomy as well.  [facility] [place]  . CHOLECYSTECTOMY  1973   open  . DECOMPRESSIVE LUMBAR LAMINECTOMY LEVEL 4  2005   In [place]  . THYROIDECTOMY  2012   multinodular goiter   Family History:  Family History  Problem Relation Age of Onset  . Heart disease Mother        unclear details "atherosclerosis"  . Diabetes Mother   . Alcohol abuse Sister   . Diabetes Brother   . Diabetes Brother   . Drug abuse Brother   . Diabetes Daughter   . Heart disease Son        CHF  . Alcohol abuse Son    Family Psychiatric  History:  Social History:  Social History   Substance and Sexual Activity  Alcohol Use No     Social History   Substance and Sexual Activity  Drug Use No    Social History   Socioeconomic History  . Marital status: Single  Spouse name: Not on file  . Number of children: 7  . Years of education: 29  . Highest education level: Not on file  Occupational History  . Occupation: pastor--travels a lot.  Tobacco Use  . Smoking status: Former Research scientist (life sciences)  . Smokeless tobacco: Never Used  . Tobacco comment: No smoking since 79 yo - smoked 10 yrs  Substance and Sexual Activity  . Alcohol use: No  . Drug use: No  . Sexual activity: Not on file  Other Topics Concern  . Not on file  Social History Narrative   Lives  alone in [place].   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   Social Determinants of Health   Financial Resource Strain:   . Difficulty of Paying Living Expenses: Not on file  Food Insecurity:   . Worried About Charity fundraiser in the Last Year: Not on file  . Ran Out of Food in the Last Year: Not on file  Transportation Needs:   . Lack of Transportation (Medical): Not on file  . Lack of Transportation (Non-Medical): Not on file  Physical Activity:   . Days of Exercise per Week: Not on file  . Minutes of Exercise per Session: Not on file  Stress:   . Feeling of Stress : Not on file  Social Connections:   . Frequency of Communication with Friends and Family: Not on file  . Frequency of Social Gatherings with Friends and Family: Not on file  . Attends Religious Services: Not on file  . Active Member of Clubs or Organizations: Not on file  . Attends Archivist Meetings: Not on file  . Marital Status: Not on file   Additional Social History:    Allergies:   Allergies  Allergen Reactions  . Clindamycin Hcl Other (See Comments)    REACTION: C. difficile colitis  . Sulfamethoxazole-Trimethoprim Other (See Comments)    Other reaction(s): Eye redness  . Zanaflex [Tizanidine Hcl] Anaphylaxis  . Metronidazole Hives  . Statins Other (See Comments)    Side Effect: muscle pain Has tried Zocor, Crestor--she does not want to take a statin, period  . Bee Venom   . Ace Inhibitors Cough  . Adhesive [Tape] Itching, Rash and Other (See Comments)    Blistering of skin     Labs:  Results for orders placed or performed during the hospital encounter of 06/24/19 (from the past 48 hour(s))  CBC with Differential     Status: None   Collection Time: 06/24/19  3:20 PM  Result Value Ref Range   WBC 4.4 4.0 - 10.5 K/uL   RBC 4.10 3.87 - 5.11 MIL/uL   Hemoglobin 12.3 12.0 - 15.0 g/dL   HCT 37.1 36.0 - 46.0 %   MCV 90.5 80.0 - 100.0  fL   MCH 30.0 26.0 - 34.0 pg   MCHC 33.2 30.0 - 36.0 g/dL   RDW 14.8 11.5 - 15.5 %   Platelets 265 150 - 400 K/uL   nRBC 0.0 0.0 - 0.2 %   Neutrophils Relative % 67 %   Neutro Abs 2.9 1.7 - 7.7 K/uL   Lymphocytes Relative 23 %   Lymphs Abs 1.0 0.7 - 4.0 K/uL   Monocytes Relative 8 %   Monocytes Absolute 0.4 0.1 - 1.0 K/uL   Eosinophils Relative 1 %   Eosinophils Absolute 0.0 0.0 - 0.5 K/uL   Basophils Relative 1 %   Basophils  Absolute 0.0 0.0 - 0.1 K/uL   Immature Granulocytes 0 %   Abs Immature Granulocytes 0.01 0.00 - 0.07 K/uL    Comment: Performed at [facility], [place] [address] [place], [place] [address]  Comprehensive metabolic panel     Status: Abnormal   Collection Time: 06/24/19  3:20 PM  Result Value Ref Range   Sodium 138 135 - 145 mmol/L   Potassium 3.9 3.5 - 5.1 mmol/L   Chloride 104 98 - 111 mmol/L   CO2 25 22 - 32 mmol/L   Glucose, Bld 117 (H) 70 - 99 mg/dL    Comment: Glucose reference range applies only to samples taken after fasting for at least 8 hours.   BUN 10 8 - 23 mg/dL   Creatinine, Ser 0.71 0.44 - 1.00 mg/dL   Calcium 9.5 8.9 - 10.3 mg/dL   Total Protein 7.3 6.5 - 8.1 g/dL   Albumin 3.9 3.5 - 5.0 g/dL   AST 22 15 - 41 U/L   ALT 12 0 - 44 U/L   Alkaline Phosphatase 49 38 - 126 U/L   Total Bilirubin 0.6 0.3 - 1.2 mg/dL   GFR calc non Af Amer >60 >60 mL/min   GFR calc Af Amer >60 >60 mL/min   Anion gap 9 5 - 15    Comment: Performed at [facility], [place] [address], [place], [place] [address]  Urinalysis, Routine w reflex microscopic- may I&O cath if menses     Status: Abnormal   Collection Time: 06/24/19  3:40 PM  Result Value Ref Range   Color, Urine YELLOW YELLOW   APPearance CLEAR CLEAR   Specific Gravity, Urine 1.012 1.005 - 1.030   pH 7.0 5.0 - 8.0   Glucose, UA 50 (A) NEGATIVE mg/dL   Hgb urine dipstick NEGATIVE NEGATIVE   Bilirubin Urine NEGATIVE NEGATIVE   Ketones, ur NEGATIVE NEGATIVE mg/dL    Protein, ur NEGATIVE NEGATIVE mg/dL   Nitrite NEGATIVE NEGATIVE   Leukocytes,Ua SMALL (A) NEGATIVE   RBC / HPF 0-5 0 - 5 RBC/hpf   WBC, UA 11-20 0 - 5 WBC/hpf   Bacteria, UA RARE (A) NONE SEEN   Squamous Epithelial / LPF 0-5 0 - 5   Mucus PRESENT     Comment: Performed at [facility], [place] [address], [place], [place] [address]  SARS CORONAVIRUS 2 (TAT 6-24 HRS) Nasopharyngeal Nasopharyngeal Swab     Status: None   Collection Time: 06/24/19  8:00 PM   Specimen: Nasopharyngeal Swab  Result Value Ref Range   SARS Coronavirus 2 NEGATIVE NEGATIVE    Comment: (NOTE) SARS-CoV-2 target nucleic acids are NOT DETECTED. The SARS-CoV-2 RNA is generally detectable in upper and lower respiratory specimens during the acute phase of infection. Negative results do not preclude SARS-CoV-2 infection, do not rule out co-infections with other pathogens, and should not be used as the sole basis for treatment or other patient management decisions. Negative results must be combined with clinical observations, patient history, and epidemiological information. The expected result is Negative. Fact Sheet for Patients: SugarRoll.be Fact Sheet for Healthcare Providers: https://www.woods-mathews.com/ This test is not yet approved or cleared by the [place] FDA and  has been authorized for detection and/or diagnosis of SARS-CoV-2 by FDA under an Emergency Use Authorization (EUA). This EUA will remain  in effect (meaning this test can be used) for the duration of the COVID-19 declaration under Section 56 4(b)(1) of the Act, 21 U.S.C. section 360bbb-3(b)(1),  unless the authorization is terminated or revoked sooner. Performed at [facility], [place] [address], [place], [place] [address]     Medications:  Current Facility-Administered Medications  Medication Dose Route Frequency Provider Last Rate Last Admin  . acetaminophen (TYLENOL)  tablet 650 mg  650 mg Oral Q6H PRN Pokhrel, Laxman, MD       Or  . acetaminophen (TYLENOL) suppository 650 mg  650 mg Rectal Q6H PRN Pokhrel, Laxman, MD      . amLODipine (NORVASC) tablet 5 mg  5 mg Oral Daily Pokhrel, Laxman, MD   5 mg at 06/25/19 0857  . bisacodyl (DULCOLAX) suppository 10 mg  10 mg Rectal Daily PRN Pokhrel, Laxman, MD   10 mg at 06/25/19 1219  . cefTRIAXone (ROCEPHIN) 1 g in sodium chloride 0.9 % 100 mL IVPB  1 g Intravenous Q24H Pokhrel, Laxman, MD      . donepezil (ARICEPT) tablet 5 mg  5 mg Oral QHS Pokhrel, Laxman, MD      . enoxaparin (LOVENOX) injection 40 mg  40 mg Subcutaneous Q24H Pokhrel, Laxman, MD      . latanoprost (XALATAN) 0.005 % ophthalmic solution 1 drop  1 drop Left Eye QHS Pokhrel, Laxman, MD      . levothyroxine (SYNTHROID) tablet 75 mcg  75 mcg Oral QAC breakfast Pokhrel, Laxman, MD      . losartan (COZAAR) tablet 100 mg  100 mg Oral Daily Pokhrel, Laxman, MD   100 mg at 06/25/19 0857  . ondansetron (ZOFRAN) tablet 4 mg  4 mg Oral Q6H PRN Pokhrel, Laxman, MD       Or  . ondansetron (ZOFRAN) injection 4 mg  4 mg Intravenous Q6H PRN Pokhrel, Laxman, MD      . polyethylene glycol (MIRALAX / GLYCOLAX) packet 17 g  17 g Oral Daily PRN Pokhrel, Laxman, MD   17 g at 06/25/19 1219  . pravastatin (PRAVACHOL) tablet 20 mg  20 mg Oral q1800 Pokhrel, Laxman, MD      . traZODone (DESYREL) tablet 100 mg  100 mg Oral QHS PRN Pokhrel, Laxman, MD        Musculoskeletal: Strength & Muscle Tone: patient assessed via tele health [place]: patient assessed via tele health Patient leans: N/A  Psychiatric Specialty Exam: Physical Exam  Psychiatric: She has a normal mood and affect. Her speech is normal and behavior is normal. Judgment and thought content normal. Cognition and memory are impaired.    Review of Systems  Constitutional: Negative.   HENT: Negative.   Eyes: Negative.   Respiratory: Negative.   Cardiovascular: Negative.   Gastrointestinal:  Negative.   Musculoskeletal: Negative.   Psychiatric/Behavioral: Negative.     Blood pressure 140/76, pulse 94, temperature 97.9 F (36.6 C), temperature source Oral, resp. rate 16, height 5\' 4"  (1.626 m), weight 72.1 kg, SpO2 100 %.Body mass index is 27.29 kg/m.  General Appearance: Casual  Eye Contact:  Good  Speech:  Clear and Coherent  Volume:  Normal  Mood:  Euthymic  Affect:  Appropriate  Thought Process:  Coherent  Orientation:  Other:  only to place, person but not to time  Thought Content:  Logical  Suicidal Thoughts:  No  Homicidal Thoughts:  No  Memory:  Immediate;   Good Recent;   Poor Remote;   Fair  Judgement:  Intact  Insight:  Fair  Psychomotor Activity:  Normal  Concentration:  Concentration: Fair and Attention Span: Fair  Recall:  [employer]  of Knowledge:  Fair  Language:  Good  Akathisia:  No  Handed:  Right  AIMS (if indicated):     Assets:  Communication Skills Desire for Improvement Social Support  ADL's:  marginal  Cognition:  Impaired,  Mild  Sleep:   good     Treatment Plan Summary: 79 year old female with history of Dementia and multiple medical problem who was admitted with altered mental status, confusion and hallucinations in the setting of UTI and severe constipation. Today, patient is alert, awake, pleasant, calm, cooperative, denies psychosis, delusions, depression or self harming thoughts. Based on my evaluation today, there is no evidence for psychosis or agitation. However, elderly people with UTI, infections and probably severe constipation are prone to hallucinations and disorientation.  Recommendations: -Consider adequate treatment for UTI and constipation. -Consider referring patient to a neurologist for evaluation and treatment of cognitive deficit upon discharge.   Disposition: No evidence of imminent risk to self or others at present.   Patient does not meet criteria for psychiatric inpatient admission. Supportive therapy  provided about ongoing stressors. Psychiatric service is signing out.Re-consult as needed.  This service was provided via telemedicine using a 2-way, interactive audio and video technology.  Names of all persons participating in this telemedicine service and their role in this encounter. Name: Abran Duke Role: Patient  Name: Corena Pilgrim, MD Role:Psychiatrist    Corena Pilgrim, MD 06/25/2019 1:48 PM

## 2019-06-25 NOTE — Progress Notes (Signed)
0530: This AM attempt to draw AM labs, pt refused. Sitter attempt to get morning vitals, refused. RN to attempt to give morning meds, refused.   Verbal redirection and re-education provided. Pt states "I am not here for any medical attention" Plan of care explained two more times, pt still continues to refuse at this time.  Will pass on to on-coming RN. Pt in bed in NAD.

## 2019-06-25 NOTE — Progress Notes (Signed)
PROGRESS NOTE  Joy Ward [id number] DOB: Jun 30, 1940 DOA: 06/24/2019 PCP: Gayland Curry, DO   LOS: 1 day   Brief narrative: As per HPI,  Joy Ward is a 79 y.o. female with the past medical history of hypertension, hyperlipidemia, diabetes mellitus type 2, history of stroke, mild dementia currently at assisted living facility (just moved yesterday) was brought into the hospital with complaints of altered mental status confusion and hallucinations.  Spoke with the patient's daughter Joy Ward who stated that patient did not know where she was and thought she was walking while she was not and thought she was in [place] while she was not.  She was also noncompliant to her antibiotic regimen that was prescribed for her UTI.  Patient had presented to the ED on 21st when she was given Keflex for UTI.  Urine culture grew E. coli at that time. In the ED, patient was noted to be slightly confused. Urine analysis was mildly abnormal.  Patient was then considered for admission to the hospital for metabolic encephalopathy and hallucinations, UTI with failed outpatient treatment.  Assessment/Plan:  Principal Problem:   Altered mental status Active Problems:   HYPERLIPIDEMIA, MIXED   Essential hypertension   Hallucinations   Metabolic encephalopathy   Altered mental status with hallucinations, patient does have a history of vascular dementia.  Likely exacerbated at this time by possible incomplete treatment of UTI, constipation.  Pending urine cultures.  Continue on Rocephin IV for now..  Will closely monitor.  Patient is currently at her assisted living facility.  Hold Norco.    Discontinue IV fluids today. Constipation.  Try MiraLAX, Dulcolax suppository.  This could be contributing to her confusion.  Inadequately treated UTI. Keflex was prescribed but hasn't taking it as per the patient's daughter.  We will continue IV Rocephin to complete 3-day course.  History of vascular dementia.   Now with hallucinations.    Will restart Aricept and trazodone since her patient was agitated yesterday.  History of hypothyroidism.  On Synthroid.  Will continue.  Essential hypertension.  Continue amlodipine, losartan.    Continue to hold Lasix  Diabetes mellitus type 2.  We will put the patient on sliding scale insulin, Accu-Cheks diabetic diet as able.  On Metformin at home.  Hyperlipidemia.  Continue pravastatin.  Check CK levels.  History of stroke.  Continue losartan, pravastatin.  No focal deficits at this time.   VTE Prophylaxis: Lovenox subcu  Code Status: Full code  Family Communication: None today  Disposition Plan:  . Patient is from assisted living facility . Likely disposition to assisted living facility by tomorrow . Barriers to discharge: IV antibiotics, closer monitoring, urine culture pending   Consultants:  None  Procedures:  None  Antibiotics:  . IV Rocephin  Anti-infectives (From admission, onward)   Start     Dose/Rate Route Frequency Ordered Stop   06/25/19 1800  cefTRIAXone (ROCEPHIN) 1 g in sodium chloride 0.9 % 100 mL IVPB     1 g 200 mL/hr over 30 Minutes Intravenous Every 24 hours 06/24/19 1950     06/24/19 1615  cefTRIAXone (ROCEPHIN) 1 g in sodium chloride 0.9 % 100 mL IVPB     1 g 200 mL/hr over 30 Minutes Intravenous  Once 06/24/19 1610 06/24/19 1859       Subjective: Today, patient was seen and examined at bedside.  Alert awake and communicative but unsure where she is.  Was agitated trying to pull lines yesterday.  Objective: Vitals:   06/24/19  2042 06/25/19 0822  BP: (!) 152/80   Pulse: 84   Resp: (!) 21   Temp: 97.8 F (36.6 C)   SpO2: 100% 97%    Intake/Output Summary (Last 24 hours) at 06/25/2019 1148 Last data filed at 06/25/2019 0915 Gross per 24 hour  Intake 595.61 ml  Output 600 ml  Net -4.39 ml   Filed Weights   06/24/19 1404  Weight: 72.1 kg   Body mass index is 27.29 kg/m.   Physical  Exam: GENERAL: Patient is alert awake and communicative, disoriented not in obvious distress. HENT: No scleral pallor or icterus. Pupils equally reactive to light. Oral mucosa is moist NECK: is supple, no gross swelling noted. CHEST: Clear to auscultation. No crackles or wheezes.  Diminished breath sounds bilaterally. CVS: S1 and S2 heard, no murmur. Regular rate and rhythm.  ABDOMEN: Soft, non-tender, bowel sounds are present. EXTREMITIES: No edema. CNS: Cranial nerves are intact. No focal motor deficits. SKIN: warm and dry without rashes.  Data Review: I have personally reviewed the following laboratory data and studies,  CBC: Recent Labs  Lab 06/24/19 1520  WBC 4.4  NEUTROABS 2.9  HGB 12.3  HCT 37.1  MCV 90.5  PLT 99991111   Basic Metabolic Panel: Recent Labs  Lab 06/24/19 1520  NA 138  K 3.9  CL 104  CO2 25  GLUCOSE 117*  BUN 10  CREATININE 0.71  CALCIUM 9.5   Liver Function Tests: Recent Labs  Lab 06/24/19 1520  AST 22  ALT 12  ALKPHOS 49  BILITOT 0.6  PROT 7.3  ALBUMIN 3.9   No results for input(s): LIPASE, AMYLASE in the last 168 hours. No results for input(s): AMMONIA in the last 168 hours. Cardiac Enzymes: No results for input(s): CKTOTAL, CKMB, CKMBINDEX, TROPONINI in the last 168 hours. BNP (last 3 results) No results for input(s): BNP in the last 8760 hours.  ProBNP (last 3 results) No results for input(s): PROBNP in the last 8760 hours.  CBG: No results for input(s): GLUCAP in the last 168 hours. Recent Results (from the past 240 hour(s))  Urine Culture     Status: Abnormal   Collection Time: 06/18/19  5:18 PM   Specimen: Urine, Random  Result Value Ref Range Status   Specimen Description URINE, RANDOM  Final   Special Requests   Final    NONE Performed at [facility], [address] [place], [place] [address]    Culture >=100,000 COLONIES/mL ESCHERICHIA COLI (A)  Final   Report Status 06/20/2019 FINAL  Final   Organism ID,  Bacteria ESCHERICHIA COLI (A)  Final      Susceptibility   Escherichia coli - MIC*    AMPICILLIN <=2 SENSITIVE Sensitive     CEFAZOLIN <=4 SENSITIVE Sensitive     CEFTRIAXONE <=0.25 SENSITIVE Sensitive     CIPROFLOXACIN <=0.25 SENSITIVE Sensitive     GENTAMICIN <=1 SENSITIVE Sensitive     IMIPENEM <=0.25 SENSITIVE Sensitive     NITROFURANTOIN <=16 SENSITIVE Sensitive     TRIMETH/SULFA <=20 SENSITIVE Sensitive     AMPICILLIN/SULBACTAM <=2 SENSITIVE Sensitive     PIP/TAZO <=4 SENSITIVE Sensitive     * >=100,000 COLONIES/mL ESCHERICHIA COLI  Novel Coronavirus, NAA (Hosp order, Send-out to Ref Lab; TAT 18-24 hrs     Status: None   Collection Time: 06/18/19  6:40 PM   Specimen: Nasopharyngeal Swab; Respiratory  Result Value Ref Range Status   SARS-CoV-2, NAA NOT DETECTED NOT DETECTED Final  Comment: (NOTE) This nucleic acid amplification test was developed and its performance characteristics determined by [employer]. Nucleic acid amplification tests include RT-PCR and TMA. This test has not been FDA cleared or approved. This test has been authorized by FDA under an Emergency Use Authorization (EUA). This test is only authorized for the duration of time the declaration that circumstances exist justifying the authorization of the emergency use of in vitro diagnostic tests for detection of SARS-CoV-2 virus and/or diagnosis of COVID-19 infection under section 564(b)(1) of the Act, 21 U.S.C. [id number]) (1), unless the authorization is terminated or revoked sooner. When diagnostic testing is negative, the possibility of a false negative result should be considered in the context of a patient's recent exposures and the presence of clinical signs and symptoms consistent with COVID-19. An individual without symptoms of COVID- 19 and who is not shedding SARS-CoV-2  virus would expect to have a negative (not detected) result in this assay. Performed At: [facility] [address] [place], [place] [id number] Rush Farmer MD [id number]    [place]  Final    Comment: Performed at [facility], [place] [address], [place], [place] [address]  SARS CORONAVIRUS 2 (TAT 6-24 HRS) Nasopharyngeal Nasopharyngeal Swab     Status: None   Collection Time: 06/24/19  8:00 PM   Specimen: Nasopharyngeal Swab  Result Value Ref Range Status   SARS Coronavirus 2 NEGATIVE NEGATIVE Final    Comment: (NOTE) SARS-CoV-2 target nucleic acids are NOT DETECTED. The SARS-CoV-2 RNA is generally detectable in upper and lower respiratory specimens during the acute phase of infection. Negative results do not preclude SARS-CoV-2 infection, do not rule out co-infections with other pathogens, and should not be used as the sole basis for treatment or other patient management decisions. Negative results must be combined with clinical observations, patient history, and epidemiological information. The expected result is Negative. Fact Sheet for Patients: SugarRoll.be Fact Sheet for Healthcare Providers: https://www.woods-mathews.com/ This test is not yet approved or cleared by the [place] FDA and  has been authorized for detection and/or diagnosis of SARS-CoV-2 by FDA under an Emergency Use Authorization (EUA). This EUA will remain  in effect (meaning this test can be used) for the duration of the COVID-19 declaration under Section 56 4(b)(1) of the Act, 21 U.S.C. section 360bbb-3(b)(1), unless the authorization is terminated or revoked sooner. Performed at [facility], [place] [address] [place], [place] [address]      Studies: DG Chest Portable 1 View  Result Date: 06/24/2019 CLINICAL DATA:  79 year old female with pneumonia EXAM: PORTABLE CHEST 1 VIEW COMPARISON:  04/30/2019 FINDINGS: Cardiomediastinal silhouette unchanged in size and contour. No evidence of central vascular congestion. No  pneumothorax or pleural effusion. No confluent airspace disease. Surgical changes of the right neck base. Coarsened interstitial markings.  No displaced fracture IMPRESSION: Chronic lung changes without evidence of acute cardiopulmonary disease Electronically Signed   By: Corrie Mckusick D.O.   On: 06/24/2019 14:51      Flora Lipps, MD  [facility] 06/25/2019

## 2019-06-25 NOTE — Progress Notes (Signed)
Called to pt room because sitter states that pt says they are wet but refuses to allow sitter to change.   RN to enter room and assess situation. Pt refuses to even let RN check to see it pt is wet. When ask to verbalizes "yes or no" to being wet, pt refuses to respond and closes eyes.   Multiple verbal attempts made to get pt to answer.   No response. Will readdress situation at a later time. Pt remains in NAD. VSS.   Will continue to assess and monitor pt. Sitter remains at bedside.

## 2019-06-26 DIAGNOSIS — N390 Urinary tract infection, site not specified: Secondary | ICD-10-CM

## 2019-06-26 LAB — COMPREHENSIVE METABOLIC PANEL
ALT: 11 U/L (ref 0–44)
AST: 18 U/L (ref 15–41)
Albumin: 3.6 g/dL (ref 3.5–5.0)
Alkaline Phosphatase: 46 U/L (ref 38–126)
Anion gap: 9 (ref 5–15)
BUN: 8 mg/dL (ref 8–23)
CO2: 22 mmol/L (ref 22–32)
Calcium: 9.3 mg/dL (ref 8.9–10.3)
Chloride: 106 mmol/L (ref 98–111)
Creatinine, Ser: 0.6 mg/dL (ref 0.44–1.00)
GFR calc Af Amer: >60 mL/min (ref >60)
GFR calc non Af Amer: >60 mL/min (ref >60)
Glucose, Bld: 151 mg/dL — ABNORMAL HIGH (ref 70–99)
Potassium: 3.5 mmol/L (ref 3.5–5.1)
Sodium: 137 mmol/L (ref 135–145)
Total Bilirubin: 0.6 mg/dL (ref 0.3–1.2)
Total Protein: 6.6 g/dL (ref 6.5–8.1)

## 2019-06-26 MED ORDER — HYDROCODONE-ACETAMINOPHEN 5-325 MG PO TABS
1.0000 | ORAL_TABLET | Freq: Once | ORAL | Status: AC
  Administered 2019-06-26: 1 via ORAL
  Filled 2019-06-26: qty 1

## 2019-06-26 MED ORDER — QUETIAPINE FUMARATE 25 MG PO TABS: 50.0000 mg | ORAL_TABLET | Freq: Every day | ORAL | Status: DC

## 2019-06-26 MED ORDER — QUETIAPINE FUMARATE 25 MG PO TABS
50.0000 mg | ORAL_TABLET | Freq: Every day | ORAL | Status: DC
  Administered 2019-06-26 – 2019-06-27 (×2): 50 mg via ORAL
  Filled 2019-06-26 (×2): qty 2

## 2019-06-26 MED ORDER — TRAZODONE HCL 100 MG PO TABS: 100.0000 mg | ORAL_TABLET | Freq: Every day | ORAL | Status: DC

## 2019-06-26 NOTE — Progress Notes (Signed)
Patient with increasing agitation, able to get one wrist restraint off and was climbing out of the bed. Other restraint removed and patient toileted. Patient then walked around room preparing to leave. Fixated on "people stole me from my home and brought me here". Attempts to reorient unsuccessful. Security happened to be rounding on floor and heard patient yelling in room. They spoke with her regarding her restraints and cooperation. Patient agreeable to getting back into bed at this time. Told this nurse she would try to rest until the doctor came to release her home. Bed alarm on, side rails up, will continue to monitor.

## 2019-06-26 NOTE — TOC Initial Note (Signed)
Transition of Care [facility]) - Initial/Assessment Note    Patient Details  Name: Joy Ward MRN: [id number] Date of Birth: 07-25-1940  Transition of Care [facility]) CM/SW Contact:    Dessa Phi, RN Phone Number: 06/26/2019, 12:33 PM  Clinical Narrative: Phoebe Perch sent confirmed w/[facility]. PT-no PT f/u. D/c plan return back to [facility].                  Expected Discharge Plan: Assisted Living Barriers to Discharge: Continued Medical Work up   Patient Goals and CMS Choice Patient states their goals for this hospitalization and ongoing recovery are:: return back to ALF [facility].gov Compare Post Acute Care list provided to:: Patient Represenative (must comment) Choice offered to / list presented to : Adult Children  Expected Discharge Plan and Services Expected Discharge Plan: Assisted Living   Discharge Planning Services: CM Consult   Living arrangements for the past 2 months: [place]                                      Prior Living Arrangements/Services Living arrangements for the past 2 months: [place] Lives with:: Facility Resident                   Activities of Daily Living Home Assistive Devices/Equipment: None ADL Screening (condition at time of admission) Patient's cognitive ability adequate to safely complete daily activities?: Yes Is the patient deaf or have difficulty hearing?: No Does the patient have difficulty seeing, even when wearing glasses/contacts?: No Does the patient have difficulty concentrating, remembering, or making decisions?: No Patient able to express need for assistance with ADLs?: Yes Does the patient have difficulty dressing or bathing?: No Independently performs ADLs?: Yes (appropriate for developmental age) Does the patient have difficulty walking or climbing stairs?: Yes Weakness of Legs: Both Weakness of Arms/Hands: None  Permission Sought/Granted                   Emotional Assessment              Admission diagnosis:  Metabolic encephalopathy 99991111 Disorientation [R41.0] Acute cystitis without hematuria [N30.00] Patient Active Problem List   Diagnosis Date Noted  . Hallucinations 06/24/2019  . Metabolic encephalopathy 99991111  . Altered mental status   . UTI (urinary tract infection) 04/30/2019  . Dementia without behavioral disturbance ([place])   . Vascular dementia with behavior disturbance ([place]) 04/14/2019  . Primary insomnia 04/14/2019  . Hypothyroidism 04/14/2019  . Diabetes mellitus ([place]) 04/14/2019  . Memory loss or impairment 03/29/2019  . Diabetic peripheral neuropathy associated with type 2 diabetes mellitus ([place]) 12/29/2017  . Peripheral edema 12/29/2017  . Elevated liver enzymes 10/15/2016  . Chest pain 04/22/2016  . Systolic murmur [id number]  . Hypercalcemia 04/22/2016  . Hyperlipidemia 04/22/2016  . Baker's cyst of knee, right 04/21/2016  . HYPOKALEMIA 01/24/2010  . CONSTIPATION 12/23/2009  . INTERMITTENT VERTIGO 12/23/2009  . SINUSITIS, ACUTE 04/25/2009  . VAGINITIS, CANDIDAL 02/14/2009  . HIP PAIN, RIGHT 02/14/2009  . HYPERLIPIDEMIA, MIXED 01/01/2009  . BACK PAIN, LUMBAR, WITH RADICULOPATHY 12/25/2008  . KNEE PAIN, BILATERAL 12/18/2008  . TROCHANTERIC BURSITIS, BILATERAL 12/18/2008  . Disturbance in sleep behavior 12/18/2008  . ONYCHOMYCOSIS, TOENAILS 10/30/2008  . ENTERITIS, CLOSTRIDIUM DIFFICILE 10/15/2006  . Diabetes type 2, uncontrolled ([place]) 09/18/2006  . Essential hypertension 09/18/2006  . Allergic rhinitis 09/18/2006  . GOITER,  MULTINODULAR 06/29/2006  . STROKE 04/27/2001   PCP:  Gayland Curry, DO Pharmacy:   [facility] [place], [place] [address] Phone: [phone] Fax: [phone]  [facility] [place], [place] [address] [place] [address] Phone: [phone] Fax: [phone]  [place]), [place] - [address] [place] [address] Phone: [phone] Fax: [phone]  [facility] [place], [place] [address] Phone: [phone] Fax: [phone]  [facility] [place], [place] [address] Phone: [phone] Fax: [phone]  [facility] [place], [place] [address] [address] [place] [address] Phone: [phone] Fax: [phone]     Social Determinants of Health (SDOH) Interventions    Readmission Risk Interventions No flowsheet data found.

## 2019-06-26 NOTE — Progress Notes (Signed)
Patient alert to self only, very forgetful and very difficult to redirect, has made frequent attempts this evening to get OOB without assistance and wander around the room, has also had a few episodes of incontinence stating "they all tell me to pee in the bed", when up patient is steady ambulating but is confused and therefore wandering outside of her room is a concern. Bed alarm reactivated each time patient is placed back in bed, patient spoke with grandson earlier over facetime, this nurse spoke with daughter via phone call, update given to daughter. Will continue to monitor patient, continue fall prevention and safety measures, and hopefully allow patient to get some rest.

## 2019-06-26 NOTE — Progress Notes (Addendum)
PROGRESS NOTE  Joy Ward [id number] DOB: 1941/03/01 DOA: 06/24/2019 PCP: Gayland Curry, DO   LOS: 1 day   Brief narrative: As per HPI,  Joy Ward is a 79 y.o. female with the past medical history of hypertension, hyperlipidemia, diabetes mellitus type 2, history of stroke, mild dementia currently at assisted living facility, moved 1 day prior to presentation to the hospital was brought into the hospital with complaints of altered mental status confusion and hallucinations.  Spoke with the patient's daughter [name] who stated that patient did not know where she was and thought she was walking while she was not and thought she was in [place] while she was not.  She was also noncompliant to her antibiotic regimen that was prescribed for her UTI.  Patient had presented to the ED on 21st when she was given Keflex for UTI.  Urine culture grew E. coli at that time. In the ED, patient was noted to be slightly confused. Urine analysis was mildly abnormal.  Patient was then considered for admission to the hospital for metabolic encephalopathy and hallucinations, UTI with failed outpatient treatment.  Assessment/Plan:  Principal Problem:   Dementia without behavioral disturbance (HCC) Active Problems:   HYPERLIPIDEMIA, MIXED   Essential hypertension   Altered mental status   Hallucinations   Metabolic encephalopathy   Altered mental status with confusion, disorientation, patient does have a history of vascular dementia.  Likely exacerbated at this time by possible incomplete treatment of UTI, constipation.  Complete the course of Rocephin today.  Urine culture with no growth.  Constipation has been addressed.  Continue on bowel regimen.  Patient likely has dementia with behavioral changes.  We will change trazodone to Seroquel daily at nighttime.  Currently at the memory care unit assisted living facility.  On Aricept which has been restarted.  Constipation.  On MiraLAX, Dulcolax  suppository.  Improved at this time.  Inadequately treated UTI. Keflex was prescribed but hasn't taking it as per the patient's daughter.  We will continue IV Rocephin to complete 3-day course.  Will complete Rocephin today.  History of vascular dementia.  With increasing confusion especially in the evening time.  Likely sundowning.  Required safety sitter and restraints.   Aricept has been restarted.  We will change trazodone to Seroquel daily at nighttime.  History of hypothyroidism.  On Synthroid.  Will continue.  Essential hypertension.  Continue amlodipine, losartan.    Blood pressure is reasonably controlled.  Lasix on hold.  Diabetes mellitus type 2.   on sliding scale insulin, Accu-Cheks diabetic diet as able.  On Metformin at home.  Hyperlipidemia.  Continue pravastatin.  Check CK levels.  History of stroke.  Continue losartan, pravastatin.  No focal deficits at this time.  VTE Prophylaxis: Lovenox subcu  Code Status: Full code  Family Communication: I tried to reach the patient's her daughter on the phone at her home and cell phone number but was unable to reach.  Addendum:  06/26/2019 4:46 PM  Tried again to reach but unable.  Disposition Plan:  . Patient is from assisted living facility . Likely disposition to assisted living facility tomorrow.  Transition of care has been consulted and is on board.  FL 2 has been sent . Barriers to discharge: IV antibiotics-complete today.   Consultants:  None  Procedures:  None  Antibiotics:  . IV Rocephin  Anti-infectives (From admission, onward)   Start     Dose/Rate Route Frequency Ordered Stop   06/25/19 1800  cefTRIAXone (ROCEPHIN)  1 g in sodium chloride 0.9 % 100 mL IVPB     1 g 200 mL/hr over 30 Minutes Intravenous Every 24 hours 06/24/19 1950     06/24/19 1615  cefTRIAXone (ROCEPHIN) 1 g in sodium chloride 0.9 % 100 mL IVPB     1 g 200 mL/hr over 30 Minutes Intravenous  Once 06/24/19 1610 06/24/19 1859       Subjective: Today, patient was seen and examined at bedside.  Patient becomes more confused and agitated in the evening times.  Was communicated with me this morning.  She was disoriented.  Denies any pain, fever, chills or rigor.    Objective: Vitals:   06/25/19 2033 06/26/19 1308  BP: (!) 154/88 (!) 135/59  Pulse: 82 (!) 42  Resp:  20  Temp: 98.3 F (36.8 C)   SpO2: 100% 100%    Intake/Output Summary (Last 24 hours) at 06/26/2019 1337 Last data filed at 06/26/2019 1301 Gross per 24 hour  Intake 240 ml  Output 525 ml  Net -285 ml   Filed Weights   06/24/19 1404  Weight: 72.1 kg   Body mass index is 27.29 kg/m.   Physical Exam: GENERAL: Patient is alert awake and communicative, disoriented but not in obvious distress.  Pleasantly confused HENT: No scleral pallor or icterus. Pupils equally reactive to light. Oral mucosa is moist NECK: is supple, no gross swelling noted. CHEST: Clear to auscultation. No crackles or wheezes.  Diminished breath sounds bilaterally. CVS: S1 and S2 heard, no murmur. Regular rate and rhythm.  ABDOMEN: Soft, non-tender, bowel sounds are present. EXTREMITIES: No edema. CNS: Cranial nerves are intact. No focal motor deficits. SKIN: warm and dry without rashes.  Data Review: I have personally reviewed the following laboratory data and studies,  CBC: Recent Labs  Lab 06/24/19 1520  WBC 4.4  NEUTROABS 2.9  HGB 12.3  HCT 37.1  MCV 90.5  PLT 99991111   Basic Metabolic Panel: Recent Labs  Lab 06/24/19 1520 06/26/19 0145  NA 138 137  K 3.9 3.5  CL 104 106  CO2 25 22  GLUCOSE 117* 151*  BUN 10 8  CREATININE 0.71 0.60  CALCIUM 9.5 9.3   Liver Function Tests: Recent Labs  Lab 06/24/19 1520 06/26/19 0145  AST 22 18  ALT 12 11  ALKPHOS 49 46  BILITOT 0.6 0.6  PROT 7.3 6.6  ALBUMIN 3.9 3.6   No results for input(s): LIPASE, AMYLASE in the last 168 hours. No results for input(s): AMMONIA in the last 168 hours. Cardiac Enzymes: No  results for input(s): CKTOTAL, CKMB, CKMBINDEX, TROPONINI in the last 168 hours. BNP (last 3 results) No results for input(s): BNP in the last 8760 hours.  ProBNP (last 3 results) No results for input(s): PROBNP in the last 8760 hours.  CBG: No results for input(s): GLUCAP in the last 168 hours. Recent Results (from the past 240 hour(s))  Urine Culture     Status: Abnormal   Collection Time: 06/18/19  5:18 PM   Specimen: Urine, Random  Result Value Ref Range Status   Specimen Description URINE, RANDOM  Final   Special Requests   Final    NONE Performed at [facility], [address] [place], [place] [address]    Culture >=100,000 COLONIES/mL ESCHERICHIA COLI (A)  Final   Report Status 06/20/2019 FINAL  Final   Organism ID, Bacteria ESCHERICHIA COLI (A)  Final      Susceptibility   Escherichia coli - MIC*  AMPICILLIN <=2 SENSITIVE Sensitive     CEFAZOLIN <=4 SENSITIVE Sensitive     CEFTRIAXONE <=0.25 SENSITIVE Sensitive     CIPROFLOXACIN <=0.25 SENSITIVE Sensitive     GENTAMICIN <=1 SENSITIVE Sensitive     IMIPENEM <=0.25 SENSITIVE Sensitive     NITROFURANTOIN <=16 SENSITIVE Sensitive     TRIMETH/SULFA <=20 SENSITIVE Sensitive     AMPICILLIN/SULBACTAM <=2 SENSITIVE Sensitive     PIP/TAZO <=4 SENSITIVE Sensitive     * >=100,000 COLONIES/mL ESCHERICHIA COLI  Novel Coronavirus, NAA (Hosp order, Send-out to Ref Lab; TAT 18-24 hrs     Status: None   Collection Time: 06/18/19  6:40 PM   Specimen: Nasopharyngeal Swab; Respiratory  Result Value Ref Range Status   SARS-CoV-2, NAA NOT DETECTED NOT DETECTED Final    Comment: (NOTE) This nucleic acid amplification test was developed and its performance characteristics determined by [employer]. Nucleic acid amplification tests include RT-PCR and TMA. This test has not been FDA cleared or approved. This test has been authorized by FDA under an Emergency Use Authorization (EUA). This test is only authorized for the  duration of time the declaration that circumstances exist justifying the authorization of the emergency use of in vitro diagnostic tests for detection of SARS-CoV-2 virus and/or diagnosis of COVID-19 infection under section 564(b)(1) of the Act, 21 U.S.C. [id number]) (1), unless the authorization is terminated or revoked sooner. When diagnostic testing is negative, the possibility of a false negative result should be considered in the context of a patient's recent exposures and the presence of clinical signs and symptoms consistent with COVID-19. An individual without symptoms of COVID- 19 and who is not shedding SARS-CoV-2  virus would expect to have a negative (not detected) result in this assay. Performed At: [facility] [address] [place], [place] [id number] Rush Farmer MD [id number]    [place]  Final    Comment: Performed at [facility], [place] [address] [place], [place] [address]  Urine culture     Status: Abnormal   Collection Time: 06/24/19  2:23 PM   Specimen: Urine, Random  Result Value Ref Range Status   Specimen Description   Final    URINE, RANDOM Performed at [place] [address], [place], [place] [address]    Special Requests   Final    NONE Performed at [facility], [place] [address], [place], [place] [address]    Culture (A)  Final    <10,000 COLONIES/mL INSIGNIFICANT GROWTH Performed at [facility] [address], [place],  [address]    Report Status 06/25/2019 FINAL  Final  SARS CORONAVIRUS 2 (TAT 6-24 HRS) Nasopharyngeal Nasopharyngeal Swab     Status: None   Collection Time: 06/24/19  8:00 PM   Specimen: Nasopharyngeal Swab  Result Value Ref Range Status   SARS Coronavirus 2 NEGATIVE NEGATIVE Final    Comment: (NOTE) SARS-CoV-2 target nucleic acids are NOT DETECTED. The SARS-CoV-2 RNA is generally detectable in upper and lower respiratory  specimens during the acute phase of infection. Negative results do not preclude SARS-CoV-2 infection, do not rule out co-infections with other pathogens, and should not be used as the sole basis for treatment or other patient management decisions. Negative results must be combined with clinical observations, patient history, and epidemiological information. The expected result is Negative. Fact Sheet for Patients: SugarRoll.be Fact Sheet for Healthcare Providers: https://www.woods-mathews.com/ This test is not yet approved or cleared by the [place]  FDA and  has been authorized for detection and/or diagnosis of SARS-CoV-2 by FDA under an Emergency Use Authorization (EUA). This EUA will remain  in effect (meaning this test can be used) for the duration of the COVID-19 declaration under Section 56 4(b)(1) of the Act, 21 U.S.C. section 360bbb-3(b)(1), unless the authorization is terminated or revoked sooner. Performed at [facility], [place] [address], [place], [place] [address]      Studies: DG Chest Portable 1 View  Result Date: 06/24/2019 CLINICAL DATA:  79 year old female with pneumonia EXAM: PORTABLE CHEST 1 VIEW COMPARISON:  04/30/2019 FINDINGS: Cardiomediastinal silhouette unchanged in size and contour. No evidence of central vascular congestion. No pneumothorax or pleural effusion. No confluent airspace disease. Surgical changes of the right neck base. Coarsened interstitial markings.  No displaced fracture IMPRESSION: Chronic lung changes without evidence of acute cardiopulmonary disease Electronically Signed   By: Corrie Mckusick D.O.   On: 06/24/2019 14:51      Flora Lipps, MD  [facility] 06/26/2019

## 2019-06-26 NOTE — NC FL2 (Signed)
[place] MEDICAID FL2 LEVEL OF CARE SCREENING TOOL     IDENTIFICATION  Patient Name: Joy Ward Birthdate: 04/29/1940 Sex: female Admission Date (Current Location): 06/24/2019  [facility] and [place] Number:  Herbalist and Address:  [facility],  [place] [address] [place]      Provider Number: [id number]  Attending Physician Name and Address:  Flora Lipps, MD  Relative Name and Phone Number:  dtr Weda Levine [phone]    Current Level of Care: Hospital Recommended Level of Care: [place] Prior Approval Number:    Date Approved/Denied:   PASRR Number:    Discharge Plan: Other (Comment)(ALF)    Current Diagnoses: Patient Active Problem List   Diagnosis Date Noted  . Hallucinations 06/24/2019  . Metabolic encephalopathy [id number]  . Altered mental status   . UTI (urinary tract infection) 04/30/2019  . Dementia without behavioral disturbance ([place])   . Vascular dementia with behavior disturbance ([place]) 04/14/2019  . Primary insomnia 04/14/2019  . Hypothyroidism 04/14/2019  . Diabetes mellitus ([place]) 04/14/2019  . Memory loss or impairment 03/29/2019  . Diabetic peripheral neuropathy associated with type 2 diabetes mellitus ([place]) 12/29/2017  . Peripheral edema 12/29/2017  . Elevated liver enzymes 10/15/2016  . Chest pain 04/22/2016  . Systolic murmur [id number]  . Hypercalcemia 04/22/2016  . Hyperlipidemia 04/22/2016  . Baker's cyst of knee, right 04/21/2016  . HYPOKALEMIA 01/24/2010  . CONSTIPATION 12/23/2009  . INTERMITTENT VERTIGO 12/23/2009  . SINUSITIS, ACUTE 04/25/2009  . VAGINITIS, CANDIDAL 02/14/2009  . HIP PAIN, RIGHT 02/14/2009  . HYPERLIPIDEMIA, MIXED 01/01/2009  . BACK PAIN, LUMBAR, WITH RADICULOPATHY 12/25/2008  . KNEE PAIN, BILATERAL 12/18/2008  . TROCHANTERIC BURSITIS, BILATERAL 12/18/2008  . Disturbance in sleep behavior 12/18/2008  . ONYCHOMYCOSIS, TOENAILS 10/30/2008  . ENTERITIS,  CLOSTRIDIUM DIFFICILE 10/15/2006  . Diabetes type 2, uncontrolled ([place]) 09/18/2006  . Essential hypertension 09/18/2006  . Allergic rhinitis 09/18/2006  . GOITER, MULTINODULAR 06/29/2006  . STROKE 04/27/2001    Orientation RESPIRATION BLADDER Height & Weight     Self, Situation  Normal Continent Weight: 72.1 kg Height:  5\' 4"  (162.6 cm)  BEHAVIORAL SYMPTOMS/MOOD NEUROLOGICAL BOWEL NUTRITION STATUS      Continent Diet(CHO MOD)  AMBULATORY STATUS COMMUNICATION OF NEEDS Skin   Limited Assist Verbally Normal                       Personal Care Assistance Level of Assistance  Bathing, Feeding, Dressing Bathing Assistance: Limited assistance Feeding assistance: Limited assistance Dressing Assistance: Limited assistance     Functional Limitations Info  Sight, Hearing, Speech Sight Info: Impaired(eyeglasses) Hearing Info: Adequate Speech Info: Adequate    SPECIAL CARE FACTORS FREQUENCY                       Contractures      Additional Factors Info  Code Status, Allergies Code Status Info: Full code Allergies Info: Clindamycin Hcl, Sulfamethoxazole-trimethoprim, Zanaflex Tizanidine Hcl, Metronidazole, Statins, Bee Venom, Ace Inhibitors, Adhesive Tape           Current Medications (06/26/2019):  This is the current hospital active medication list Current Facility-Administered Medications  Medication Dose Route Frequency Provider Last Rate Last Admin  . acetaminophen (TYLENOL) tablet 650 mg  650 mg Oral Q6H PRN Pokhrel, Laxman, MD   650 mg at 06/26/19 1129   Or  . acetaminophen (TYLENOL) suppository 650 mg  650 mg Rectal Q6H PRN Pokhrel, Corrie Mckusick, MD      .  amLODipine (NORVASC) tablet 5 mg  5 mg Oral Daily Pokhrel, Laxman, MD   5 mg at 06/26/19 1129  . bisacodyl (DULCOLAX) suppository 10 mg  10 mg Rectal Daily PRN Pokhrel, Laxman, MD   10 mg at 06/25/19 1219  . cefTRIAXone (ROCEPHIN) 1 g in sodium chloride 0.9 % 100 mL IVPB  1 g Intravenous Q24H Pokhrel, Laxman,  MD 200 mL/hr at 06/25/19 1814 1 g at 06/25/19 1814  . donepezil (ARICEPT) tablet 5 mg  5 mg Oral QHS Pokhrel, Laxman, MD   5 mg at 06/25/19 2110  . enoxaparin (LOVENOX) injection 40 mg  40 mg Subcutaneous Q24H Pokhrel, Laxman, MD   40 mg at 06/25/19 2110  . latanoprost (XALATAN) 0.005 % ophthalmic solution 1 drop  1 drop Left Eye QHS Pokhrel, Laxman, MD      . levothyroxine (SYNTHROID) tablet 75 mcg  75 mcg Oral QAC breakfast Pokhrel, Laxman, MD      . losartan (COZAAR) tablet 100 mg  100 mg Oral Daily Pokhrel, Laxman, MD   100 mg at 06/26/19 1129  . ondansetron (ZOFRAN) tablet 4 mg  4 mg Oral Q6H PRN Pokhrel, Laxman, MD       Or  . ondansetron (ZOFRAN) injection 4 mg  4 mg Intravenous Q6H PRN Pokhrel, Laxman, MD      . polyethylene glycol (MIRALAX / GLYCOLAX) packet 17 g  17 g Oral Daily PRN Pokhrel, Laxman, MD   17 g at 06/25/19 1219  . pravastatin (PRAVACHOL) tablet 20 mg  20 mg Oral q1800 Pokhrel, Laxman, MD   20 mg at 06/25/19 1808  . traZODone (DESYREL) tablet 100 mg  100 mg Oral QHS PRN Pokhrel, Laxman, MD   100 mg at 06/25/19 2110     Discharge Medications: Please see discharge summary for a list of discharge medications.  Relevant Imaging Results:  Relevant Lab Results:   Additional Information SSN: [id number]  Salvatore Shear, Juliann Pulse, RN

## 2019-06-26 NOTE — Evaluation (Signed)
Occupational Therapy Evaluation Patient Details Name: Joy Ward MRN: [id number] DOB: 05-19-1940 Today's Date: 06/26/2019    History of Present Illness Joy Ward is a 79 y.o. female with the past medical history of hypertension, hyperlipidemia, diabetes mellitus type 2, history of stroke, mild dementia currently at assisted living facility (just moved yesterday-06/23/19) was brought into the hospital with complaints of altered mental status confusion and hallucinations. Was being treated for UTI but not compliant wtih taking medications.   Clinical Impression   This 79 yo female admitted with above presents to acute OT with limited eval due to pleasantly not wanting to get OOB. Spoke to RN before I entered room based off of PT note earlier today. RN reports she is not sure if pt can do her own basic ADLs. In to see pt, she is very pleasant, but not wanting to get up, "I have been walking all morning and my knees hurt". Asked if she needed to go to bathroom and she said "no, but I am hungry when is supper". Pt was able to scoot herself laterally and up in bed without assist as well as feed herself the items I brought her. Pt may benefit from continued OT services at ALF depending on her baseline compared to how she is now, but without her willingness to get OOB hard to determine. We will continue to follow.    Follow Up Recommendations  Other (comment)(defer to A'd living facility based on PLOF and current LOF)    Equipment Recommendations  None recommended by OT       Precautions / Restrictions Precautions Precautions: Fall Restrictions Weight Bearing Restrictions: No      Mobility Bed Mobility               General bed mobility comments: scooting over in bed and scooting up in bed S           ADL either performed or assessed with clinical judgement   ADL Overall ADL's : Needs assistance/impaired Eating/Feeding: Independent;Bed level   Grooming: Wash/dry face;Bed  level   Upper Body Bathing: Supervision/ safety;Set up;Bed level       Upper Body Dressing : Minimal assistance;Bed level                           Vision Patient Visual Report: No change from baseline              Pertinent Vitals/Pain Pain Assessment: Faces Faces Pain Scale: Hurts little more Pain Location: Bil knees Pain Descriptors / Indicators: Aching;Sore Pain Intervention(s): (pt did not want to get OOB due to this)     Hand Dominance Right   Extremity/Trunk Assessment Upper Extremity Assessment Upper Extremity Assessment: Overall WFL for tasks assessed           Communication Communication Communication: No difficulties   Cognition Arousal/Alertness: Awake/alert Behavior During Therapy: WFL for tasks assessed/performed Overall Cognitive Status: History of cognitive impairments - at baseline                                 General Comments: Pt knew she was not in "her" room--wasnt sure where she was. Explained to her that she was not her in room because she was not feeling well and so they had moved her for awhile to a room where they could help her feel better, but she would be going back to  her room once she felt better.              Home Living Family/patient expects to be discharged to:: Assisted living                                             OT Problem List: Decreased cognition      OT Treatment/Interventions: Self-care/ADL training;Patient/family education;Balance training    OT Goals(Current goals can be found in the care plan section) Acute Rehab OT Goals Patient Stated Goal: to rest in bed OT Goal Formulation: Patient unable to participate in goal setting Time For Goal Achievement: 07/10/19 Potential to Achieve Goals: Fair  OT Frequency: Min 2X/week              AM-PAC OT "6 Clicks" Daily Activity     Outcome Measure Help from another person eating meals?: None Help from another  person taking care of personal grooming?: A Little Help from another person toileting, which includes using toliet, bedpan, or urinal?: Total Help from another person bathing (including washing, rinsing, drying)?: A Lot Help from another person to put on and taking off regular upper body clothing?: A Lot Help from another person to put on and taking off regular lower body clothing?: Total 6 Click Score: 13   End of Session Nurse Communication: (pt pleasantly not willing to want to get OOB)  Activity Tolerance: Patient tolerated treatment well Patient left: in bed;with call bell/phone within reach;with bed alarm set  OT Visit Diagnosis: Other symptoms and signs involving cognitive function                Time: [id number] OT Time Calculation (min): 10 min Charges:  OT General Charges $OT Visit: 1 Visit OT Evaluation $OT Eval Low Complexity: 1 Low  Cathy OTR/L Acute Rehab Services Pager [phone] Office [phone]     06/26/2019, 4:41 PM

## 2019-06-26 NOTE — Plan of Care (Signed)

## 2019-06-26 NOTE — Progress Notes (Signed)
PT Cancellation Note / Screen  Patient Details Name: Normani Borntreger MRN: [id number] DOB: [birth year]   Cancelled Treatment:    Reason Eval/Treat Not Completed: PT screened, no needs identified, will sign off Pt reports she ambulated this morning and requests to rest at this time.  RN and nurse tech report they observed pt ambulating earlier and do not believe she needs PT at this time.  PT to sign off.   Arrion Burruel,KATHrine E 06/26/2019, 11:02 AM Arlyce Dice, DPT Acute Rehabilitation Services Office: [phone]

## 2019-06-26 NOTE — Progress Notes (Signed)
Patient becoming increasingly agitated and keeps trying to put on her coat and leave. Order obtained for soft wrist restraints. It was explained to the patient what we were going to do and why, also explained criteria for discontinuation of the restraints. Patient remains confused and is not understanding. Bilateral soft wrist restraints applied without difficulty, not too tight, remote with call bell placed in patients left hand, patient convinced that she was in another room and had left all of her belongings there, attempts to reorient unsuccessful, will continue to monitor.  Just now received call from central telemetry that patient having bigeminy and trigeminy PVC's, will notify provider through text page on [place].

## 2019-06-27 MED ORDER — HYDROCODONE-ACETAMINOPHEN 5-325 MG PO TABS
1.0000 | ORAL_TABLET | Freq: Four times a day (QID) | ORAL | Status: AC | PRN
  Administered 2019-06-27 – 2019-06-28 (×2): 1 via ORAL
  Filled 2019-06-27 (×2): qty 1

## 2019-06-27 NOTE — TOC Progression Note (Signed)
Transition of Care [facility]) - Progression Note    Patient Details  Name: Joy Ward MRN: [id number] Date of Birth: 06-02-1940  Transition of [facility]) CM/SW Contact  Tonni Mansour, Juliann Pulse, RN Phone Number: 06/27/2019, 3:23 PM  Clinical Narrative: [facility] spoke to rep [place] covid results-if stable can return back tomorrow.      Expected Discharge Plan: Assisted Living Barriers to Discharge: Continued Medical Work up  Expected Discharge Plan and Services Expected Discharge Plan: Assisted Living   Discharge Planning Services: CM Consult   Living arrangements for the past 2 months: Assisted Living Facility                                       Social Determinants of Health (SDOH) Interventions    Readmission Risk Interventions No flowsheet data found.

## 2019-06-27 NOTE — Progress Notes (Signed)
PROGRESS NOTE  Joy Ward [id number] DOB: [birth year] DOA: 06/24/2019 PCP: Gayland Curry, DO   LOS: 2 days   Brief narrative: As per HPI,  Joy Ward is a 79 y.o. female with the past medical history of hypertension, hyperlipidemia, diabetes mellitus type 2, history of stroke, mild dementia currently at assisted living facility, moved 1 day prior to presentation to the hospital was brought into the hospital with complaints of altered mental status confusion and hallucinations.  Spoke with the patient's daughter [name] who stated that patient did not know where she was and thought she was walking while she was not and thought she was in [place] while she was not.  She was also noncompliant to her antibiotic regimen that was prescribed for her UTI.  Patient had presented to the ED on 21st when she was given Keflex for UTI.  Urine culture grew E. coli at that time. In the ED, patient was noted to be slightly confused. Urine analysis was mildly abnormal.  Patient was then considered for admission to the hospital for metabolic encephalopathy and hallucinations, UTI with failed outpatient treatment.  Assessment/Plan:  Principal Problem:   Dementia without behavioral disturbance (HCC) Active Problems:   HYPERLIPIDEMIA, MIXED   Essential hypertension   Altered mental status   Hallucinations   Metabolic encephalopathy   Altered mental status with confusion, disorientation, patient does have a history of vascular dementia.  Likely exacerbated at this time by possible incomplete treatment of UTI, constipation.  Improved.  Patient likely at her baseline at this time.  At this time, it looks like patient has a predominant vascular dementia causing her symptoms.  Complete the course of Rocephin. Urine culture with no growth.  Constipation has been addressed.  Continue on bowel regimen.  Started try Seroquel yesterday.  Currently at the memory care unit assisted living facility.  Continue  Aricept.  Was on trazodone which has been discontinued.  Patient was also seen by psychiatry who feel dementia is the predominant issue and will need outpatient evaluation after acute issues are addressed.  Constipation.  On MiraLAX, Dulcolax suppository.    Inadequately treated UTI. Keflex was prescribed but hasn't taking it as per the patient's daughter.Received IV Rocephin and completed 3-day course.    History of vascular dementia with sundowing. Required safety sitter and restraints.   Aricept has been restarted. Trazodone changed to Seroquel daily at nighttime.  History of hypothyroidism.  On Synthroid.  Will continue.  Essential hypertension.  Continue amlodipine, losartan.   will resume lasix.   Diabetes mellitus type 2.   on sliding scale insulin, Accu-Cheks diabetic diet as able.  On Metformin at home.  Hyperlipidemia.  Continue pravastatin.  Check CK levels.  History of stroke.  Continue losartan, pravastatin.  No focal deficits at this time.  VTE Prophylaxis: Lovenox subcu  Code Status: Full code  Family Communication: I was unable to reach the patient's daughter again on the phone today.  Disposition Plan:  . Patient is from assisted living facility . Likely disposition to assisted living facility tomorrow.  Transition of care has been consulted and is on board.  FL 2 has been sent, will need COVID test today.  . Medically stable for discharge . Barriers to discharge: COVID test pending, disposition to ALF.  Consultants:  None  Procedures:  Psychiatry  Antibiotics:  . IV Rocephin completed  Anti-infectives (From admission, onward)   Start     Dose/Rate Route Frequency Ordered Stop   06/25/19 1800  cefTRIAXone (  ROCEPHIN) 1 g in sodium chloride 0.9 % 100 mL IVPB     1 g 200 mL/hr over 30 Minutes Intravenous Every 24 hours 06/24/19 1950     06/24/19 1615  cefTRIAXone (ROCEPHIN) 1 g in sodium chloride 0.9 % 100 mL IVPB     1 g 200 mL/hr over 30 Minutes  Intravenous  Once 06/24/19 1610 06/24/19 1859     Subjective: Today, patient was seen and examined at bedside.  Patient mild was much more calm yesterday.  Has been having bowel movements.  Objective: Vitals:   06/27/19 0514 06/27/19 1336  BP: (!) 150/93 130/82  Pulse: 80 76  Resp:  16  Temp:  98.2 F (36.8 C)  SpO2: 98% 100%    Intake/Output Summary (Last 24 hours) at 06/27/2019 1501 Last data filed at 06/27/2019 1300 Gross per 24 hour  Intake 240 ml  Output --  Net 240 ml   Filed Weights   06/24/19 1404 06/27/19 0500  Weight: 72.1 kg 68.7 kg   Body mass index is 26 kg/m.   Physical Exam: GENERAL: Patient is alert awake and communicative, pleasantly confused and disoriented from underlying dementia.   HENT: No scleral pallor or icterus. Pupils equally reactive to light. Oral mucosa is moist NECK: is supple, no gross swelling noted. CHEST: Clear to auscultation. No crackles or wheezes.  Diminished breath sounds bilaterally. CVS: S1 and S2 heard, no murmur. Regular rate and rhythm.  ABDOMEN: Soft, non-tender, bowel sounds are present. EXTREMITIES: No edema. CNS: Cranial nerves are intact. No focal motor deficits.  Pleasantly confused SKIN: warm and dry without rashes.  Data Review: I have personally reviewed the following laboratory data and studies,  CBC: Recent Labs  Lab 06/24/19 1520  WBC 4.4  NEUTROABS 2.9  HGB 12.3  HCT 37.1  MCV 90.5  PLT 99991111   Basic Metabolic Panel: Recent Labs  Lab 06/24/19 1520 06/26/19 0145  NA 138 137  K 3.9 3.5  CL 104 106  CO2 25 22  GLUCOSE 117* 151*  BUN 10 8  CREATININE 0.71 0.60  CALCIUM 9.5 9.3   Liver Function Tests: Recent Labs  Lab 06/24/19 1520 06/26/19 0145  AST 22 18  ALT 12 11  ALKPHOS 49 46  BILITOT 0.6 0.6  PROT 7.3 6.6  ALBUMIN 3.9 3.6   No results for input(s): LIPASE, AMYLASE in the last 168 hours. No results for input(s): AMMONIA in the last 168 hours. Cardiac Enzymes: No results for  input(s): CKTOTAL, CKMB, CKMBINDEX, TROPONINI in the last 168 hours. BNP (last 3 results) No results for input(s): BNP in the last 8760 hours.  ProBNP (last 3 results) No results for input(s): PROBNP in the last 8760 hours.  CBG: No results for input(s): GLUCAP in the last 168 hours. Recent Results (from the past 240 hour(s))  Urine Culture     Status: Abnormal   Collection Time: 06/18/19  5:18 PM   Specimen: Urine, Random  Result Value Ref Range Status   Specimen Description URINE, RANDOM  Final   Special Requests   Final    NONE Performed at [facility], [address] [place], [place] [address]    Culture >=100,000 COLONIES/mL ESCHERICHIA COLI (A)  Final   Report Status 06/20/2019 FINAL  Final   Organism ID, Bacteria ESCHERICHIA COLI (A)  Final      Susceptibility   Escherichia coli - MIC*    AMPICILLIN <=2 SENSITIVE Sensitive     CEFAZOLIN <=4 SENSITIVE  Sensitive     CEFTRIAXONE <=0.25 SENSITIVE Sensitive     CIPROFLOXACIN <=0.25 SENSITIVE Sensitive     GENTAMICIN <=1 SENSITIVE Sensitive     IMIPENEM <=0.25 SENSITIVE Sensitive     NITROFURANTOIN <=16 SENSITIVE Sensitive     TRIMETH/SULFA <=20 SENSITIVE Sensitive     AMPICILLIN/SULBACTAM <=2 SENSITIVE Sensitive     PIP/TAZO <=4 SENSITIVE Sensitive     * >=100,000 COLONIES/mL ESCHERICHIA COLI  Novel Coronavirus, NAA (Hosp order, Send-out to Ref Lab; TAT 18-24 hrs     Status: None   Collection Time: 06/18/19  6:40 PM   Specimen: Nasopharyngeal Swab; Respiratory  Result Value Ref Range Status   SARS-CoV-2, NAA NOT DETECTED NOT DETECTED Final    Comment: (NOTE) This nucleic acid amplification test was developed and its performance characteristics determined by [employer]. Nucleic acid amplification tests include RT-PCR and TMA. This test has not been FDA cleared or approved. This test has been authorized by FDA under an Emergency Use Authorization (EUA). This test is only authorized for the duration of  time the declaration that circumstances exist justifying the authorization of the emergency use of in vitro diagnostic tests for detection of SARS-CoV-2 virus and/or diagnosis of COVID-19 infection under section 564(b)(1) of the Act, 21 U.S.C. [id number]) (1), unless the authorization is terminated or revoked sooner. When diagnostic testing is negative, the possibility of a false negative result should be considered in the context of a patient's recent exposures and the presence of clinical signs and symptoms consistent with COVID-19. An individual without symptoms of COVID- 19 and who is not shedding SARS-CoV-2  virus would expect to have a negative (not detected) result in this assay. Performed At: [facility] [address] [place], [place] [id number] Rush Farmer MD [id number]    [place]  Final    Comment: Performed at [facility], [place] [address] [place], [place] [address]  Urine culture     Status: Abnormal   Collection Time: 06/24/19  2:23 PM   Specimen: Urine, Random  Result Value Ref Range Status   Specimen Description   Final    URINE, RANDOM Performed at [facility] [address], [place], [place] [address]    Special Requests   Final    NONE Performed at [facility], [place] [address], [place],  [address]    Culture (A)  Final    <10,000 COLONIES/mL INSIGNIFICANT GROWTH Performed at [facility] [address], [place],  [address]    Report Status 06/25/2019 FINAL  Final  SARS CORONAVIRUS 2 (TAT 6-24 HRS) Nasopharyngeal Nasopharyngeal Swab     Status: None   Collection Time: 06/24/19  8:00 PM   Specimen: Nasopharyngeal Swab  Result Value Ref Range Status   SARS Coronavirus 2 NEGATIVE NEGATIVE Final    Comment: (NOTE) SARS-CoV-2 target nucleic acids are NOT DETECTED. The SARS-CoV-2 RNA is generally detectable in upper and lower respiratory specimens during  the acute phase of infection. Negative results do not preclude SARS-CoV-2 infection, do not rule out co-infections with other pathogens, and should not be used as the sole basis for treatment or other patient management decisions. Negative results must be combined with clinical observations, patient history, and epidemiological information. The expected result is Negative. Fact Sheet for Patients: SugarRoll[employer]be Fact Sheet for Healthcare Providers: https://www.woods-mathews.com/ This test is not yet approved or cleared by the [place] FDA and  has been authorized for detection and/or diagnosis of  SARS-CoV-2 by FDA under an Emergency Use Authorization (EUA). This EUA will remain  in effect (meaning this test can be used) for the duration of the COVID-19 declaration under Section 56 4(b)(1) of the Act, 21 U.S.C. section 360bbb-3(b)(1), unless the authorization is terminated or revoked sooner. Performed at [facility], [place] [address], [place], [place] [address]      Studies: No results found.    Flora Lipps, MD  [facility] 06/27/2019

## 2019-06-28 LAB — SARS CORONAVIRUS 2 (TAT 6-24 HRS): SARS Coronavirus 2: NEGATIVE

## 2019-06-28 MED ORDER — HYDROCODONE-ACETAMINOPHEN 5-325 MG PO TABS: 1.0000 | ORAL_TABLET | Freq: Four times a day (QID) | ORAL | 0 refills | Status: DC | PRN

## 2019-06-28 MED ORDER — POLYETHYLENE GLYCOL 3350 17 G PO PACK: 17.0000 g | PACK | Freq: Every day | ORAL | 2 refills | Status: DC | PRN

## 2019-06-28 MED ORDER — LIDOCAINE 5 % EX PTCH: 1.0000 | MEDICATED_PATCH | Freq: Two times a day (BID) | CUTANEOUS | 2 refills | Status: DC

## 2019-06-28 MED ORDER — QUETIAPINE FUMARATE 50 MG PO TABS: 50.0000 mg | ORAL_TABLET | Freq: Every day | ORAL | 2 refills | Status: DC

## 2019-06-28 MED ORDER — ACETAMINOPHEN 325 MG PO TABS: 650.0000 mg | ORAL_TABLET | Freq: Four times a day (QID) | ORAL | Status: DC | PRN

## 2019-06-28 MED ORDER — QUETIAPINE FUMARATE 50 MG PO TABS: 50.0000 mg | ORAL_TABLET | Freq: Every day | ORAL | Status: DC

## 2019-06-28 MED ORDER — BISACODYL 10 MG RE SUPP: 10.0000 mg | Freq: Every day | RECTAL | 0 refills | Status: DC | PRN

## 2019-06-28 MED ORDER — POLYETHYLENE GLYCOL 3350 17 G PO PACK: 17.0000 g | PACK | Freq: Every day | ORAL | Status: DC | PRN

## 2019-06-28 MED ORDER — ACETAMINOPHEN 325 MG PO TABS: 650.0000 mg | ORAL_TABLET | Freq: Four times a day (QID) | ORAL | 0 refills | Status: DC | PRN

## 2019-06-28 MED ORDER — LIDOCAINE 5 % EX PTCH: 1.0000 | MEDICATED_PATCH | Freq: Two times a day (BID) | CUTANEOUS | Status: DC

## 2019-06-28 NOTE — Progress Notes (Signed)
Patient vital signs stable. RN removed telemetry leads, returned telemetry box and leads to nurse station. RN removed IV intact. NT assisted patient in getting dressed.  RN transported patient via wheelchair to main entrance where daughter was waiting.  RN assisted patient safely into daughter's vehicle.

## 2019-06-28 NOTE — Discharge Summary (Signed)
Physician Discharge Summary  Joy Ward [id number] DOB: [birth year] DOA: 06/24/2019  PCP: Gayland Curry, DO  Admit date: 06/24/2019 Discharge date: 06/28/2019  Admitted From: Home  Discharge disposition: Home   Recommendations for Outpatient Follow-Up:   . Follow up with your primary care provider in 3 to 5 days. . Check CBC, BMP in the next visit. . Trazodone has been discontinued and patient has been started on Seroquel with good response.  Marland Kitchen  Please try to avoid narcotics as much as possible.  Been prescribed diclofenac gel and lidocaine patch for pain. . Increase oral fluids.  Discharge Diagnosis:   Principal Problem:   Dementia without behavioral disturbance ([place]) Active Problems:   HYPERLIPIDEMIA, MIXED   Essential hypertension   Altered mental status   Hallucinations   Metabolic encephalopathy   Discharge Condition: Improved.  Diet recommendation: Low sodium, heart healthy.    Wound care: None.  Code status: Full.   History of Present Illness:   Joy Zellousis a 79 y.o.femalewith the past medical history of hypertension, hyperlipidemia, diabetes mellitus type 2, history of stroke, mild dementia currently at assisted living facility, moved 1 day prior to presentation to the hospital was brought into the hospital with complaints of altered mental status confusion and hallucinations.Spoke with the patient's daughter [name] who stated that patient did not know where she wasand thoughtshe was walking while she was not and thought she was in [place] while she was not. She was also noncompliant to her antibiotic regimen that was prescribed for her UTI. Patient had presented to the ED on 21st when she was given Keflex for UTI. Urine culture grew E. coli at that time. In the ED, patient was noted tobe slightly confused. Urine analysis was mildly abnormal. Patient was then considered for admission to the hospital for metabolic encephalopathy and  hallucinations,UTI with failed outpatient treatment.  Hospital Course:   Following conditions were addressed during hospitalization as listed below,  Altered mental statuswith confusion, disorientation, patient does have a history of vascular dementia.  Likely exacerbated at this time by possible incomplete treatment of UTI, constipation.  Improved.  Patient likely at her baseline at this time.  At this time, it looks like patient has a predominant vascular dementia causing her symptoms.  Completed the course of Rocephin. Urine culture with no growth.  Constipation has been addressed.  Continue on bowel regimen on discharge..  Patient has responded to Seroquel pretty well.  We will continue on discharge.  Discontinued trazodone.  Currently at the memory care unit assisted living facility.  Continue Aricept.  Patient was also seen by psychiatry who feel dementia is the predominant issue and will need outpatient evaluation after acute issues are addressed.  Try to avoid narcotics as much as possible.  Chronic back pain.  Patient is on narcotics for a long time.  I have encouraged to decrease the narcotics as much as possible.  We will decrease the dose of Norco to 5 mg.  Will prescribe Lidoderm patch and diclofenac gel.  Spoke with the patient's daughter about constipation and confusion with narcotics.  Constipation.  On MiraLAX, Dulcolax suppository.    Will continue on discharge, please ensure that the patient is not constipated.  Inadequately treated UTI.Keflex was prescribed but hasn't taking it as per the patient's daughter.Received IV Rocephin and completed 3-day course.    History ofvasculardementia with sundowing. Required safety sitter and restraints initially.  Patient was continued on Aricept.  Trazodone was changed to Seroquel with good  response without agitation.Marland Kitchen  History of hypothyroidism. On Synthroid. Will continue.  Essential hypertension.Continue amlodipine,losartan,  Lasix.  Encourage oral fluids.  Diabetes mellitus type 2.   Resume Metformin at home.  Hyperlipidemia. Continue pravastatin.   History of stroke.Continue losartan, pravastatin.  No focal deficits at this time.  Disposition.  At this time, patient is stable for disposition back to assisted living facility.  I spoke with the patient's daughter Ms. Joy Ward on the phone and updated her about the clinical condition of the patient.  Medical Consultants:    Psychiatry  Procedures:    None Subjective:   Today, patient is alert awake and communicative.  Calm and composed  Discharge Exam:   Vitals:   06/27/19 2023 06/28/19 0531  BP: 132/64 123/74  Pulse: 73 73  Resp: 16 17  Temp: 98.2 F (36.8 C) 97.9 F (36.6 C)  SpO2: 100% 98%   Vitals:   06/27/19 1336 06/27/19 2023 06/28/19 0500 06/28/19 0531  BP: 130/82 132/64  123/74  Pulse: 76 73  73  Resp: 16 16  17   Temp: 98.2 F (36.8 C) 98.2 F (36.8 C)  97.9 F (36.6 C)  TempSrc: Oral Oral  Oral  SpO2: 100% 100%  98%  Weight:   70.9 kg   Height:       General: Alert awake, not in obvious distress, has underlying dementia with confusion HENT: pupils equally reacting to light,  No scleral pallor or icterus noted. Oral mucosa is moist.  Chest:  Clear breath sounds.  Diminished breath sounds bilaterally. No crackles or wheezes.  CVS: S1 &S2 heard. No murmur.  Regular rate and rhythm. Abdomen: Soft, nontender, nondistended.  Bowel sounds are heard.   Extremities: No cyanosis, clubbing or edema.  Peripheral pulses are palpable. Psych: Alert, awake and communicative.  Disoriented. CNS:  No cranial nerve deficits.  Power equal in all extremities.   Skin: Warm and dry.  No rashes noted.  The results of significant diagnostics from this hospitalization (including imaging, microbiology, ancillary and laboratory) are listed below for reference.     Diagnostic Studies:   DG Chest Portable 1 View  Result Date:  06/24/2019 CLINICAL DATA:  79 year old female with pneumonia EXAM: PORTABLE CHEST 1 VIEW COMPARISON:  04/30/2019 FINDINGS: Cardiomediastinal silhouette unchanged in size and contour. No evidence of central vascular congestion. No pneumothorax or pleural effusion. No confluent airspace disease. Surgical changes of the right neck base. Coarsened interstitial markings.  No displaced fracture IMPRESSION: Chronic lung changes without evidence of acute cardiopulmonary disease Electronically Signed   By: Corrie Mckusick D.O.   On: 06/24/2019 14:51     Labs:   Basic Metabolic Panel: Recent Labs  Lab 06/24/19 1520 06/26/19 0145  NA 138 137  K 3.9 3.5  CL 104 106  CO2 25 22  GLUCOSE 117* 151*  BUN 10 8  CREATININE 0.71 0.60  CALCIUM 9.5 9.3   GFR Estimated Creatinine Clearance: 56.9 mL/min (by C-G formula based on SCr of 0.6 mg/dL). Liver Function Tests: Recent Labs  Lab 06/24/19 1520 06/26/19 0145  AST 22 18  ALT 12 11  ALKPHOS 49 46  BILITOT 0.6 0.6  PROT 7.3 6.6  ALBUMIN 3.9 3.6   No results for input(s): LIPASE, AMYLASE in the last 168 hours. No results for input(s): AMMONIA in the last 168 hours. Coagulation profile No results for input(s): INR, PROTIME in the last 168 hours.  CBC: Recent Labs  Lab 06/24/19 1520  WBC 4.4  NEUTROABS 2.9  HGB 12.3  HCT 37.1  MCV 90.5  PLT 265   Cardiac Enzymes: No results for input(s): CKTOTAL, CKMB, CKMBINDEX, TROPONINI in the last 168 hours. BNP: Invalid input(s): POCBNP CBG: No results for input(s): GLUCAP in the last 168 hours. D-Dimer No results for input(s): DDIMER in the last 72 hours. Hgb A1c No results for input(s): HGBA1C in the last 72 hours. Lipid Profile No results for input(s): CHOL, HDL, LDLCALC, TRIG, CHOLHDL, LDLDIRECT in the last 72 hours. Thyroid function studies No results for input(s): TSH, T4TOTAL, T3FREE, THYROIDAB in the last 72 hours.  Invalid input(s): FREET3 Anemia work up No results for input(s):  VITAMINB12, FOLATE, FERRITIN, TIBC, IRON, RETICCTPCT in the last 72 hours. Microbiology Recent Results (from the past 240 hour(s))  Urine Culture     Status: Abnormal   Collection Time: 06/18/19  5:18 PM   Specimen: Urine, Random  Result Value Ref Range Status   Specimen Description URINE, RANDOM  Final   Special Requests   Final    NONE Performed at [facility], [address] [place], [place] [address]    Culture >=100,000 COLONIES/mL ESCHERICHIA COLI (A)  Final   Report Status 06/20/2019 FINAL  Final   Organism ID, Bacteria ESCHERICHIA COLI (A)  Final      Susceptibility   Escherichia coli - MIC*    AMPICILLIN <=2 SENSITIVE Sensitive     CEFAZOLIN <=4 SENSITIVE Sensitive     CEFTRIAXONE <=0.25 SENSITIVE Sensitive     CIPROFLOXACIN <=0.25 SENSITIVE Sensitive     GENTAMICIN <=1 SENSITIVE Sensitive     IMIPENEM <=0.25 SENSITIVE Sensitive     NITROFURANTOIN <=16 SENSITIVE Sensitive     TRIMETH/SULFA <=20 SENSITIVE Sensitive     AMPICILLIN/SULBACTAM <=2 SENSITIVE Sensitive     PIP/TAZO <=4 SENSITIVE Sensitive     * >=100,000 COLONIES/mL ESCHERICHIA COLI  Novel Coronavirus, NAA (Hosp order, Send-out to Ref Lab; TAT 18-24 hrs     Status: None   Collection Time: 06/18/19  6:40 PM   Specimen: Nasopharyngeal Swab; Respiratory  Result Value Ref Range Status   SARS-CoV-2, NAA NOT DETECTED NOT DETECTED Final    Comment: (NOTE) This nucleic acid amplification test was developed and its performance characteristics determined by [employer]. Nucleic acid amplification tests include RT-PCR and TMA. This test has not been FDA cleared or approved. This test has been authorized by FDA under an Emergency Use Authorization (EUA). This test is only authorized for the duration of time the declaration that circumstances exist justifying the authorization of the emergency use of in vitro diagnostic tests for detection of SARS-CoV-2 virus and/or diagnosis of COVID-19 infection  under section 564(b)(1) of the Act, 21 U.S.C. [id number]) (1), unless the authorization is terminated or revoked sooner. When diagnostic testing is negative, the possibility of a false negative result should be considered in the context of a patient's recent exposures and the presence of clinical signs and symptoms consistent with COVID-19. An individual without symptoms of COVID- 19 and who is not shedding SARS-CoV-2  virus would expect to have a negative (not detected) result in this assay. Performed At: [facility] [address] [place], [place] [id number] Rush Farmer MD [id number]    [place]  Final    Comment: Performed at [facility], [place] [address], [place], [place] [address]  Urine culture     Status: Abnormal   Collection Time: 06/24/19  2:23 PM   Specimen: Urine, Random  Result Value Ref  Range Status   Specimen Description   Final    URINE, RANDOM Performed at [facility], [place] [address] [place], [place] [address]    Special Requests   Final    NONE Performed at [facility], [place] [address] [place], [place] [address]    Culture (A)  Final    <10,000 COLONIES/mL INSIGNIFICANT GROWTH Performed at [facility] [address], [place], [place] [address]    Report Status 06/25/2019 FINAL  Final  SARS CORONAVIRUS 2 (TAT 6-24 HRS) Nasopharyngeal Nasopharyngeal Swab     Status: None   Collection Time: 06/24/19  8:00 PM   Specimen: Nasopharyngeal Swab  Result Value Ref Range Status   SARS Coronavirus 2 NEGATIVE NEGATIVE Final    Comment: (NOTE) SARS-CoV-2 target nucleic acids are NOT DETECTED. The SARS-CoV-2 RNA is generally detectable in upper and lower respiratory specimens during the acute phase of infection. Negative results do not preclude SARS-CoV-2 infection, do not rule out co-infections with other pathogens, and should not be used as the sole basis for treatment or  other patient management decisions. Negative results must be combined with clinical observations, patient history, and epidemiological information. The expected result is Negative. Fact Sheet for Patients: SugarRoll.be Fact Sheet for Healthcare Providers: https://www.woods-mathews.com/ This test is not yet approved or cleared by the [place] FDA and  has been authorized for detection and/or diagnosis of SARS-CoV-2 by FDA under an Emergency Use Authorization (EUA). This EUA will remain  in effect (meaning this test can be used) for the duration of the COVID-19 declaration under Section 56 4(b)(1) of the Act, 21 U.S.C. section 360bbb-3(b)(1), unless the authorization is terminated or revoked sooner. Performed at [facility], [place] [address] [place], [place] [address]   SARS CORONAVIRUS 2 (TAT 6-24 HRS) Nasopharyngeal Nasopharyngeal Swab     Status: None   Collection Time: 06/27/19  5:30 PM   Specimen: Nasopharyngeal Swab  Result Value Ref Range Status   SARS Coronavirus 2 NEGATIVE NEGATIVE Final    Comment: (NOTE) SARS-CoV-2 target nucleic acids are NOT DETECTED. The SARS-CoV-2 RNA is generally detectable in upper and lower respiratory specimens during the acute phase of infection. Negative results do not preclude SARS-CoV-2 infection, do not rule out co-infections with other pathogens, and should not be used as the sole basis for treatment or other patient management decisions. Negative results must be combined with clinical observations, patient history, and epidemiological information. The expected result is Negative. Fact Sheet for Patients: SugarRoll.be Fact Sheet for Healthcare Providers: https://www.woods-mathews.com/ This test is not yet approved or cleared by the [place] FDA and  has been authorized for detection and/or diagnosis of SARS-CoV-2 by FDA under an Emergency Use  Authorization (EUA). This EUA will remain  in effect (meaning this test can be used) for the duration of the COVID-19 declaration under Section 56 4(b)(1) of the Act, 21 U.S.C. section 360bbb-3(b)(1), unless the authorization is terminated or revoked sooner. Performed at [facility], [place] [address], [place], [place] [address]      Discharge Instructions:   Discharge Instructions    Diet general   Complete by: As directed    Discharge instructions   Complete by: As directed    Follow up with PCP at ALF in 3-5 days.   Increase activity slowly   Complete by: As directed      Allergies as of 06/28/2019      Reactions   Clindamycin Hcl  Other (See Comments)   REACTION: C. difficile colitis   Sulfamethoxazole-trimethoprim Other (See Comments)   Other reaction(s): Eye redness   Zanaflex [tizanidine Hcl] Anaphylaxis   Metronidazole Hives   Statins Other (See Comments)   Side Effect: muscle pain Has tried Zocor, Crestor--she does not want to take a statin, period   Bee Venom    Ace Inhibitors Cough   Adhesive [tape] Itching, Rash, Other (See Comments)   Blistering of skin      Medication List    STOP taking these medications   cephALEXin 500 MG capsule Commonly known as: KEFLEX   HYDROcodone-acetaminophen 10-325 MG tablet Commonly known as: NORCO   traZODone 100 MG tablet Commonly known as: DESYREL     TAKE these medications   acetaminophen 325 MG tablet Commonly known as: TYLENOL Take 2 tablets (650 mg total) by mouth every 6 (six) hours as needed for mild pain (or Fever >/= 101).   amLODipine 5 MG tablet Commonly known as: NORVASC Take 1 tablet (5 mg total) by mouth daily.   bimatoprost 0.01 % Soln Commonly known as: LUMIGAN Place 1 drop into the left eye at bedtime.   bisacodyl 10 MG suppository Commonly known as: DULCOLAX Place 1 suppository (10 mg total) rectally daily as needed for moderate constipation.   diclofenac sodium 1 % Gel Commonly  known as: VOLTAREN Apply 4 g topically 2 (two) times daily as needed (pain).   donepezil 5 MG tablet Commonly known as: ARICEPT Take 1 tablet (5 mg total) by mouth at bedtime.   Fish Oil 1000 MG Caps Take 1,000 mg by mouth daily.   furosemide 20 MG tablet Commonly known as: LASIX Take 1 tablet (20 mg total) by mouth daily.   levothyroxine 75 MCG tablet Commonly known as: SYNTHROID Take 1 tablet (75 mcg total) by mouth daily before breakfast.   lidocaine 5 % Commonly known as: Lidoderm Place 1 patch onto the skin every 12 (twelve) hours. Remove & Discard patch within 12 hours or as directed by MD. At the site of back pain   losartan 100 MG tablet Commonly known as: COZAAR Take 1 tablet (100 mg total) by mouth daily.   metFORMIN 500 MG 24 hr tablet Commonly known as: GLUCOPHAGE-XR 1 tab by mouth twice daily with meals What changed:   how much to take  how to take this  when to take this   multivitamin with minerals Tabs tablet Take 1 tablet by mouth daily.   polyethylene glycol 17 g packet Commonly known as: MIRALAX / GLYCOLAX Take 17 g by mouth daily as needed for mild constipation.   pravastatin 20 MG tablet Commonly known as: PRAVACHOL Take 1 tablet (20 mg total) by mouth daily at 6 PM.   QUEtiapine 50 MG tablet Commonly known as: SEROQUEL Take 1 tablet (50 mg total) by mouth at bedtime.        Time coordinating discharge: 39 minutes  Signed:  Candy Leverett  [facility] 06/28/2019, 9:06 AM

## 2019-06-28 NOTE — Care Management Important Message (Signed)
Important Message  Patient Details IM Letter given to Dessa Phi RN Case Manager to present to the Patient Name: Kathe Campa MRN: [id number] Date of Birth: [birth year]   Medicare Important Message Given:  Yes     Kerin Salen 06/28/2019, 11:16 AM

## 2019-06-28 NOTE — TOC Transition Note (Signed)
Transition of Care [facility]) - CM/SW Discharge Note   Patient Details  Name: Joy Ward MRN: [id number] Date of Birth: [birth year]  [facility] CM/SW Contact:  Dessa Phi, RN Phone Number: 06/28/2019, 10:18 AM   Clinical Narrative: Faxed  D/c summary w/confirmation-d/c back to [facility]-rep Barbara tel#[phone] for nurse report, going to rm #[phone]. Daughter [name] will transport back on own. No further CM needs.     Final next level of care: Assisted Living Barriers to Discharge: No Barriers Identified   Patient Goals and CMS Choice Patient states their goals for this hospitalization and ongoing recovery are:: return back to ALF [facility].gov Compare Post Acute Care list provided to:: Patient Represenative (must comment) Choice offered to / list presented to : Adult Children  Discharge Placement              Patient chooses bed at: [facility] Patient to be transferred to facility by: [facility]      Discharge Plan and Services   Discharge Planning Services: CM Consult                                 Social Determinants of Health (SDOH) Interventions     Readmission Risk Interventions No flowsheet data found.

## 2019-06-29 ENCOUNTER — Other Ambulatory Visit: Payer: Medicare Other

## 2019-06-29 ENCOUNTER — Other Ambulatory Visit: Payer: Self-pay

## 2019-06-29 DIAGNOSIS — E039 Hypothyroidism, unspecified: Secondary | ICD-10-CM

## 2019-06-29 DIAGNOSIS — E1169 Type 2 diabetes mellitus with other specified complication: Secondary | ICD-10-CM

## 2019-06-30 ENCOUNTER — Telehealth: Payer: Self-pay | Admitting: *Deleted

## 2019-06-30 LAB — BASIC METABOLIC PANEL
BUN/Creatinine Ratio: 13 (calc) (ref 6–22)
BUN: 12 mg/dL (ref 7–25)
CO2: 28 mmol/L (ref 20–32)
Calcium: 10.2 mg/dL (ref 8.6–10.4)
Chloride: 99 mmol/L (ref 98–110)
Creat: 0.94 mg/dL — ABNORMAL HIGH (ref 0.60–0.93)
Glucose, Bld: 156 mg/dL — ABNORMAL HIGH (ref 65–99)
Potassium: 4 mmol/L (ref 3.5–5.3)
Sodium: 137 mmol/L (ref 135–146)

## 2019-06-30 LAB — CBC WITH DIFFERENTIAL/PLATELET
Absolute Monocytes: 330 cells/uL (ref 200–950)
Basophils Absolute: 50 cells/uL (ref 0–200)
Basophils Relative: 0.9 %
Eosinophils Absolute: 101 cells/uL (ref 15–500)
Eosinophils Relative: 1.8 %
HCT: 35.9 % (ref 35.0–45.0)
Hemoglobin: 11.9 g/dL (ref 11.7–15.5)
Lymphs Abs: 1680 cells/uL (ref 850–3900)
MCH: 29.9 pg (ref 27.0–33.0)
MCHC: 33.1 g/dL (ref 32.0–36.0)
MCV: 90.2 fL (ref 80.0–100.0)
MPV: 11.1 fL (ref 7.5–12.5)
Monocytes Relative: 5.9 %
Neutro Abs: 3438 cells/uL (ref 1500–7800)
Neutrophils Relative %: 61.4 %
Platelets: 262 10*3/uL (ref 140–400)
RBC: 3.98 10*6/uL (ref 3.80–5.10)
RDW: 14.4 % (ref 11.0–15.0)
Total Lymphocyte: 30 %
WBC: 5.6 10*3/uL (ref 3.8–10.8)

## 2019-06-30 LAB — TSH: TSH: 7.79 mIU/L — ABNORMAL HIGH (ref 0.40–4.50)

## 2019-06-30 NOTE — Telephone Encounter (Signed)
Transition Care Management Follow-up Telephone Call  Date of discharge and from where: 06/28/19 [place]  How have you been since you were released from the hospital? Doing well  Any questions or concerns? No   Items Reviewed:  Did the pt receive and understand the discharge instructions provided? Yes   Medications obtained and verified? Yes  Patient kept stating she was still taking current medications.   Any new allergies since your discharge? No   Dietary orders reviewed? Yes  Do you have support at home? Yes   Other (ie: DME, Home Health, etc) Home Health through facility [place]: (I = Independent and D = Dependent) ADL's: I with assistance  Bathing/Dressing- I with assistance   Meal Prep- D  Eating- I  Maintaining continence- I  Transferring/Ambulation- I  Managing Meds- D   Follow up appointments reviewed:    PCP Hospital f/u appt confirmed? Yes  Scheduled to see Dr Mariea Clonts on 07/03/19.  [facility] f/u appt confirmed? No    Are transportation arrangements needed? No   If their condition worsens, is the pt aware to call  their PCP or go to the ED? Yes  Was the patient provided with contact information for the PCP's office or ED? Yes  Was the pt encouraged to call back with questions or concerns? Yes

## 2019-06-30 NOTE — Progress Notes (Signed)
Now it appears she is not getting enough thyroid medication.  This can be due to missed doses or that she needs to be on a higher dosage.  Please confirm with [name] if her mother is taking her synthroid daily first thing in the am on an empty stomach. (?missed doses with multiple ED visits/transfer to facility)  It also looks like she has not been drinking enough water. Blood counts are normal.

## 2019-07-03 ENCOUNTER — Ambulatory Visit (INDEPENDENT_AMBULATORY_CARE_PROVIDER_SITE_OTHER): Payer: Medicare Other | Admitting: Internal Medicine

## 2019-07-03 ENCOUNTER — Encounter: Payer: Self-pay | Admitting: Internal Medicine

## 2019-07-03 ENCOUNTER — Other Ambulatory Visit: Payer: Self-pay

## 2019-07-03 VITALS — BP 120/78 | HR 70 | Temp 97.1°F | Ht 64.0 in | Wt 153.0 lb

## 2019-07-03 DIAGNOSIS — F01518 Vascular dementia, unspecified severity, with other behavioral disturbance: Secondary | ICD-10-CM

## 2019-07-03 DIAGNOSIS — G8929 Other chronic pain: Secondary | ICD-10-CM | POA: Diagnosis not present

## 2019-07-03 DIAGNOSIS — N3281 Overactive bladder: Secondary | ICD-10-CM | POA: Diagnosis not present

## 2019-07-03 DIAGNOSIS — R443 Hallucinations, unspecified: Secondary | ICD-10-CM | POA: Diagnosis not present

## 2019-07-03 DIAGNOSIS — E039 Hypothyroidism, unspecified: Secondary | ICD-10-CM

## 2019-07-03 DIAGNOSIS — M5416 Radiculopathy, lumbar region: Secondary | ICD-10-CM

## 2019-07-03 DIAGNOSIS — F0151 Vascular dementia with behavioral disturbance: Secondary | ICD-10-CM

## 2019-07-03 DIAGNOSIS — K5901 Slow transit constipation: Secondary | ICD-10-CM

## 2019-07-03 DIAGNOSIS — Z515 Encounter for palliative care: Secondary | ICD-10-CM | POA: Insufficient documentation

## 2019-07-03 MED ORDER — POLYETHYLENE GLYCOL 3350 17 G PO PACK: 17.0000 g | PACK | Freq: Every day | ORAL | 11 refills | Status: DC | PRN

## 2019-07-03 MED ORDER — HYDROCODONE-ACETAMINOPHEN 5-325 MG PO TABS: 1.0000 | ORAL_TABLET | Freq: Four times a day (QID) | ORAL | 0 refills | Status: AC | PRN
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
## 2019-07-14 ENCOUNTER — Encounter: Payer: Self-pay | Admitting: Internal Medicine

## 2019-07-17 ENCOUNTER — Other Ambulatory Visit: Payer: Self-pay | Admitting: Internal Medicine

## 2019-07-17 ENCOUNTER — Telehealth: Payer: Self-pay

## 2019-07-17 DIAGNOSIS — E785 Hyperlipidemia, unspecified: Secondary | ICD-10-CM

## 2019-07-17 DIAGNOSIS — N3000 Acute cystitis without hematuria: Secondary | ICD-10-CM

## 2019-07-17 DIAGNOSIS — E1169 Type 2 diabetes mellitus with other specified complication: Secondary | ICD-10-CM

## 2019-07-17 MED ORDER — CIPROFLOXACIN HCL 500 MG PO TABS: 500.0000 mg | ORAL_TABLET | Freq: Two times a day (BID) | ORAL | 0 refills | Status: DC

## 2019-07-17 MED ORDER — PRAVASTATIN SODIUM 20 MG PO TABS: 20.0000 mg | ORAL_TABLET | Freq: Every day | ORAL | 5 refills | Status: DC

## 2019-07-17 MED ORDER — PRAVASTATIN SODIUM 20 MG PO TABS: 20.0000 mg | ORAL_TABLET | Freq: Every day | ORAL | 11 refills | Status: DC

## 2019-07-17 NOTE — Telephone Encounter (Signed)
Ok to increase seroquel to 100mg  qhs.  This order will need to be sent to the facility and the Rx to the pharmacy of choice.  Thanks.

## 2019-07-17 NOTE — Telephone Encounter (Signed)
Joy Ward, patient's daughter, states that the patient is still not sleeping on the current dose of 50 mg of Seroquel, per the facility. Joy Ward is asking if you can increase the dose, or try another medication for sleep. Daughter only mentioned sleep issues and did not mention if hallucinations had improved.   "-she is to be back on seroquel therapy and risks/black box warning explained to her daughter and she still agrees to long-term use for hallucinations b/c they are affecting her qol and resulting in numerous ED visits"

## 2019-07-18 ENCOUNTER — Other Ambulatory Visit: Payer: Self-pay

## 2019-07-18 ENCOUNTER — Ambulatory Visit: Payer: Medicare Other

## 2019-07-18 ENCOUNTER — Inpatient Hospital Stay: Admission: RE | Admit: 2019-07-18 | Payer: Medicare Other | Source: Ambulatory Visit

## 2019-07-18 DIAGNOSIS — R443 Hallucinations, unspecified: Secondary | ICD-10-CM

## 2019-07-18 MED ORDER — QUETIAPINE FUMARATE 100 MG PO TABS: 100.0000 mg | ORAL_TABLET | Freq: Every day | ORAL | 0 refills | Status: DC

## 2019-07-18 NOTE — Telephone Encounter (Signed)
Prepared order to fax to [facility] facility.  Maylon Peppers, daughter, to verify the preferred pharmacy.  VM left to return call.

## 2019-07-18 NOTE — Telephone Encounter (Signed)
Maylon Peppers, patient's daughter again.  She requested Rx be sent to [facility]. Rx was sent and order faxed to [facility] with new dosing of 100 mg of Seroquel QHS.

## 2019-07-19 ENCOUNTER — Telehealth: Payer: Self-pay | Admitting: *Deleted

## 2019-07-19 NOTE — Telephone Encounter (Signed)
Ok to d/c hydrocodone order.

## 2019-07-19 NOTE — Telephone Encounter (Signed)
Joella Prince with [facility] called and stated that patient is seeing a Pain Management Dr. And he is prescribing Hydrocodone.  Roselyn Reef stated that she needs an order from Dr. Mariea Clonts to Discontinue the order for Hydrocodone 5mg  that she had patient on, so she can request an order from the Pain Management Dr. For the new Rx Needs order to discontinue medication faxed to [phone] Please Advise.

## 2019-07-20 NOTE — Telephone Encounter (Signed)
Printed and faxed to [facility].

## 2019-07-23 ENCOUNTER — Emergency Department (HOSPITAL_COMMUNITY)
Admission: EM | Admit: 2019-07-23 | Discharge: 2019-07-23 | Disposition: A | Discharge status: Home / Self Care | Payer: Medicare Other | Attending: Emergency Medicine | Admitting: Emergency Medicine

## 2019-07-23 ENCOUNTER — Encounter (HOSPITAL_COMMUNITY): Payer: Self-pay | Admitting: Emergency Medicine

## 2019-07-23 ENCOUNTER — Emergency Department (HOSPITAL_COMMUNITY): Payer: Medicare Other

## 2019-07-23 DIAGNOSIS — Z7984 Long term (current) use of oral hypoglycemic drugs: Secondary | ICD-10-CM | POA: Diagnosis not present

## 2019-07-23 DIAGNOSIS — E119 Type 2 diabetes mellitus without complications: Secondary | ICD-10-CM | POA: Diagnosis not present

## 2019-07-23 DIAGNOSIS — Z9049 Acquired absence of other specified parts of digestive tract: Secondary | ICD-10-CM | POA: Diagnosis not present

## 2019-07-23 DIAGNOSIS — E039 Hypothyroidism, unspecified: Secondary | ICD-10-CM | POA: Diagnosis not present

## 2019-07-23 DIAGNOSIS — N390 Urinary tract infection, site not specified: Secondary | ICD-10-CM

## 2019-07-23 DIAGNOSIS — Z87891 Personal history of nicotine dependence: Secondary | ICD-10-CM | POA: Diagnosis not present

## 2019-07-23 DIAGNOSIS — Z79899 Other long term (current) drug therapy: Secondary | ICD-10-CM | POA: Insufficient documentation

## 2019-07-23 DIAGNOSIS — Z7401 Bed confinement status: Secondary | ICD-10-CM | POA: Diagnosis not present

## 2019-07-23 DIAGNOSIS — Z8673 Personal history of transient ischemic attack (TIA), and cerebral infarction without residual deficits: Secondary | ICD-10-CM | POA: Insufficient documentation

## 2019-07-23 DIAGNOSIS — R42 Dizziness and giddiness: Secondary | ICD-10-CM | POA: Diagnosis not present

## 2019-07-23 DIAGNOSIS — I10 Essential (primary) hypertension: Secondary | ICD-10-CM | POA: Diagnosis not present

## 2019-07-23 DIAGNOSIS — R4182 Altered mental status, unspecified: Secondary | ICD-10-CM | POA: Diagnosis not present

## 2019-07-23 DIAGNOSIS — G459 Transient cerebral ischemic attack, unspecified: Secondary | ICD-10-CM | POA: Diagnosis not present

## 2019-07-23 DIAGNOSIS — R410 Disorientation, unspecified: Secondary | ICD-10-CM | POA: Diagnosis not present

## 2019-07-23 DIAGNOSIS — M2550 Pain in unspecified joint: Secondary | ICD-10-CM | POA: Diagnosis not present

## 2019-07-23 LAB — COMPREHENSIVE METABOLIC PANEL
ALT: 17 U/L (ref 0–44)
AST: 19 U/L (ref 15–41)
Albumin: 3.5 g/dL (ref 3.5–5.0)
Alkaline Phosphatase: 58 U/L (ref 38–126)
Anion gap: 9 (ref 5–15)
BUN: 9 mg/dL (ref 8–23)
CO2: 26 mmol/L (ref 22–32)
Calcium: 9.1 mg/dL (ref 8.9–10.3)
Chloride: 103 mmol/L (ref 98–111)
Creatinine, Ser: 0.59 mg/dL (ref 0.44–1.00)
GFR calc Af Amer: >60 mL/min (ref >60)
GFR calc non Af Amer: >60 mL/min (ref >60)
Glucose, Bld: 131 mg/dL — ABNORMAL HIGH (ref 70–99)
Potassium: 3.3 mmol/L — ABNORMAL LOW (ref 3.5–5.1)
Sodium: 138 mmol/L (ref 135–145)
Total Bilirubin: 0.7 mg/dL (ref 0.3–1.2)
Total Protein: 6.5 g/dL (ref 6.5–8.1)

## 2019-07-23 LAB — URINALYSIS, ROUTINE W REFLEX MICROSCOPIC
Bilirubin Urine: NEGATIVE
Glucose, UA: NEGATIVE mg/dL
Hgb urine dipstick: NEGATIVE
Ketones, ur: NEGATIVE mg/dL
Nitrite: POSITIVE — AB
Protein, ur: NEGATIVE mg/dL
Specific Gravity, Urine: 1.005 (ref 1.005–1.030)
pH: 7 (ref 5.0–8.0)

## 2019-07-23 LAB — DIFFERENTIAL
Abs Immature Granulocytes: 0.01 10*3/uL (ref 0.00–0.07)
Basophils Absolute: 0 10*3/uL (ref 0.0–0.1)
Basophils Relative: 1 %
Eosinophils Absolute: 0.2 10*3/uL (ref 0.0–0.5)
Eosinophils Relative: 4 %
Immature Granulocytes: 0 %
Lymphocytes Relative: 44 %
Lymphs Abs: 2 10*3/uL (ref 0.7–4.0)
Monocytes Absolute: 0.4 10*3/uL (ref 0.1–1.0)
Monocytes Relative: 8 %
Neutro Abs: 1.9 10*3/uL (ref 1.7–7.7)
Neutrophils Relative %: 43 %

## 2019-07-23 LAB — CBC
HCT: 34.6 % — ABNORMAL LOW (ref 36.0–46.0)
Hemoglobin: 11.1 g/dL — ABNORMAL LOW (ref 12.0–15.0)
MCH: 29.4 pg (ref 26.0–34.0)
MCHC: 32.1 g/dL (ref 30.0–36.0)
MCV: 91.8 fL (ref 80.0–100.0)
Platelets: 210 10*3/uL (ref 150–400)
RBC: 3.77 MIL/uL — ABNORMAL LOW (ref 3.87–5.11)
RDW: 14.4 % (ref 11.5–15.5)
WBC: 4.4 10*3/uL (ref 4.0–10.5)
nRBC: 0 % (ref 0.0–0.2)

## 2019-07-23 LAB — CBG MONITORING, ED: Glucose-Capillary: 104 mg/dL — ABNORMAL HIGH (ref 70–99)

## 2019-07-23 LAB — TROPONIN I (HIGH SENSITIVITY): Troponin I (High Sensitivity): 3 ng/L (ref <18)

## 2019-07-23 MED ORDER — HYDROCODONE-ACETAMINOPHEN 10-325 MG PO TABS
1.0000 | ORAL_TABLET | Freq: Once | ORAL | Status: AC
  Administered 2019-07-23: 1 via ORAL
  Filled 2019-07-23: qty 1

## 2019-07-23 MED ORDER — SODIUM CHLORIDE 0.9 % IV SOLN
2.0000 g | Freq: Once | INTRAVENOUS | Status: AC
  Administered 2019-07-23: 08:00:00 2 g via INTRAVENOUS
  Filled 2019-07-23: qty 20

## 2019-07-23 MED ORDER — CEPHALEXIN 500 MG PO CAPS: 500.0000 mg | ORAL_CAPSULE | Freq: Two times a day (BID) | ORAL | 0 refills | Status: AC

## 2019-07-23 MED ORDER — POTASSIUM CHLORIDE CRYS ER 20 MEQ PO TBCR
40.0000 meq | EXTENDED_RELEASE_TABLET | Freq: Once | ORAL | Status: AC
  Administered 2019-07-23: 09:00:00 40 meq via ORAL
  Filled 2019-07-23: qty 2

## 2019-07-23 NOTE — ED Notes (Signed)
Pt able to ambulate with minimal assistance to the restroom

## 2019-07-23 NOTE — Discharge Instructions (Addendum)
You were found to have a urinary tract infection today Please take medication as prescribed. We have sent the urine for culture and you will receive a call in 2-3 days time if the antibiotic needs to be changed Please follow up with your PCP Dr. Mariea Clonts for further evaluation if your dizziness continues however your MRI did not show any acute findings today.  Return to the ED for any worsening symptoms including worsening dizziness, vision changes, passing out, vomiting, chest pain, shortness of breath, speech changes, weakness or numbness on one side of your body

## 2019-07-23 NOTE — ED Notes (Signed)
Report called and given to [facility]. PTAR at bedside.

## 2019-07-23 NOTE — ED Provider Notes (Signed)
[facility] DEPT Provider Note   CSN: [id number] Arrival date & time: 07/23/19  0534     History Chief Complaint  Patient presents with  . Dizziness    Joy Ward is a 79 y.o. female with PMHx chronic back pain, DDD, diabetes, HTN, HLD, hypothyroidism, dementia who presents to the ED today via EMS from SNF for complaint of dizziness.  Per triage report she was sleeping and got up out of bed and felt dizzy prompting staff wanting her to get checked out.  EMS ports that nursing staff told him patient was slurring her words around 4:30 AM however with EMS assessment there were no focal neuro deficits or slurring of speech.  She does have history of frequent UTIs and was recently taken off of antibiotics for it.  She was admitted to the hospital from 2/27-03/03 for AMS; found to be noncompliant with her abx regimen for her UTI. Pt currently denies any dizziness but is unsure when it stopped. She cannot recall any slurring of her speech. She has no other complaints at this time.   The history is provided by the patient, the nursing home, the EMS personnel and medical records.       Past Medical History:  Diagnosis Date  . Allergy   . Arthritis   . Chronic back pain   . DDD (degenerative disc disease), lumbar   . Diabetes mellitus without complication ([place])   . Diabetes type 2, uncontrolled ([place]) 09/18/2006   Annotation: Noninsulin dependent Qualifier: Diagnosis of  By: Amil Amen MD, Benjamine Mola    . Former tobacco use   . Hyperlipidemia    a. H/o muscle aches with statins per PCP notes.  . Hypertension   . Hypothyroidism    a. prior thyroid removal.  . Memory loss or impairment 03/29/2019  . Stroke [facility])    a. Listed in PCP notes: "History of stroke:  History of terrible headache.  Had a CT scan of brain and found "ministroke"  Was removed from ERT subsequently. "    Patient Active Problem List   Diagnosis Date Noted  . Palliative care patient  07/03/2019  . Hallucinations 06/24/2019  . Metabolic encephalopathy [id number]  . Altered mental status   . UTI (urinary tract infection) 04/30/2019  . Dementia without behavioral disturbance ([place])   . Vascular dementia with behavior disturbance ([place]) 04/14/2019  . Primary insomnia 04/14/2019  . Hypothyroidism 04/14/2019  . Diabetes mellitus ([place]) 04/14/2019  . Memory loss or impairment 03/29/2019  . Diabetic peripheral neuropathy associated with type 2 diabetes mellitus ([place]) 12/29/2017  . Peripheral edema 12/29/2017  . Elevated liver enzymes 10/15/2016  . Chest pain 04/22/2016  . Systolic murmur [id number]  . Hypercalcemia 04/22/2016  . Hyperlipidemia 04/22/2016  . Baker's cyst of knee, right 04/21/2016  . HYPOKALEMIA 01/24/2010  . CONSTIPATION 12/23/2009  . INTERMITTENT VERTIGO 12/23/2009  . SINUSITIS, ACUTE 04/25/2009  . VAGINITIS, CANDIDAL 02/14/2009  . HIP PAIN, RIGHT 02/14/2009  . HYPERLIPIDEMIA, MIXED 01/01/2009  . BACK PAIN, LUMBAR, WITH RADICULOPATHY 12/25/2008  . KNEE PAIN, BILATERAL 12/18/2008  . TROCHANTERIC BURSITIS, BILATERAL 12/18/2008  . Disturbance in sleep behavior 12/18/2008  . ONYCHOMYCOSIS, TOENAILS 10/30/2008  . ENTERITIS, CLOSTRIDIUM DIFFICILE 10/15/2006  . Diabetes type 2, uncontrolled ([place]) 09/18/2006  . Essential hypertension 09/18/2006  . Allergic rhinitis 09/18/2006  . GOITER, MULTINODULAR 06/29/2006  . STROKE 04/27/2001    Past Surgical History:  Procedure Laterality Date  . ABDOMINAL HYSTERECTOMY  1994   TAH, not clear if  unilateral oophorectomy as well.  [facility] [place]  . CHOLECYSTECTOMY  1973   open  . DECOMPRESSIVE LUMBAR LAMINECTOMY LEVEL 4  2005   In [place]  . THYROIDECTOMY  2012   multinodular goiter     OB History   No obstetric history on file.     Family History  Problem Relation Age of Onset  . Heart disease Mother        unclear details "atherosclerosis"  . Diabetes Mother   . Alcohol  abuse Sister   . Diabetes Brother   . Diabetes Brother   . Drug abuse Brother   . Diabetes Daughter   . Heart disease Son        CHF  . Alcohol abuse Son     Social History   Tobacco Use  . Smoking status: Former Research scientist (life sciences)  . Smokeless tobacco: Never Used  . Tobacco comment: No smoking since 79 yo - smoked 10 yrs  Substance Use Topics  . Alcohol use: No  . Drug use: No    Home Medications Prior to Admission medications   Medication Sig Start Date End Date Taking? Authorizing Provider  acetaminophen (TYLENOL) 325 MG tablet Take 2 tablets (650 mg total) by mouth every 6 (six) hours as needed for mild pain (or Fever >/= 101). 06/28/19  Yes Pokhrel, Laxman, MD  amLODipine (NORVASC) 5 MG tablet Take 1 tablet (5 mg total) by mouth daily. 04/05/19  Yes Mack Hook, MD  bisacodyl (DULCOLAX) 10 MG suppository Place 1 suppository (10 mg total) rectally daily as needed for moderate constipation. 06/28/19  Yes Pokhrel, Laxman, MD  donepezil (ARICEPT) 5 MG tablet Take 1 tablet (5 mg total) by mouth at bedtime. 04/05/19  Yes Mack Hook, MD  furosemide (LASIX) 20 MG tablet Take 1 tablet (20 mg total) by mouth daily. 05/15/19  Yes Reed, Tiffany L, DO  levothyroxine (SYNTHROID) 75 MCG tablet Take 1 tablet (75 mcg total) by mouth daily before breakfast. 04/14/19  Yes Reed, Tiffany L, DO  loperamide (IMODIUM) 2 MG capsule Take 2 mg by mouth as needed for diarrhea or loose stools (do not exceed 8 dsoses in 24 hours).   Yes [provider]  losartan (COZAAR) 100 MG tablet Take 1 tablet (100 mg total) by mouth daily. 04/05/19  Yes Mack Hook, MD  metFORMIN (GLUCOPHAGE-XR) 500 MG 24 hr tablet 1 tab by mouth twice daily with meals Patient taking differently: Take 500 mg by mouth 2 (two) times daily with a meal.  05/15/19  Yes Reed, Tiffany L, DO  Omega-3 Fatty Acids (FISH OIL) 1000 MG CAPS Take 1,000 mg by mouth daily.    Yes [provider]  polyethylene glycol (MIRALAX /  GLYCOLAX) 17 g packet Take 17 g by mouth daily as needed for mild constipation. 07/03/19  Yes Reed, Tiffany L, DO  pravastatin (PRAVACHOL) 20 MG tablet Take 1 tablet (20 mg total) by mouth daily at 6 PM. 07/17/19  Yes Reed, Tiffany L, DO  QUEtiapine (SEROQUEL) 100 MG tablet Take 1 tablet (100 mg total) by mouth at bedtime. 07/18/19  Yes Reed, Tiffany L, DO  bimatoprost (LUMIGAN) 0.01 % SOLN Place 1 drop into the left eye at bedtime. Patient not taking: Reported on 07/23/2019 03/04/18   Mack Hook, MD  cephALEXin (KEFLEX) 500 MG capsule Take 1 capsule (500 mg total) by mouth 2 (two) times daily for 5 days. 07/23/19 07/28/19  Eustaquio Maize, PA-C  diclofenac sodium (VOLTAREN) 1 % GEL  Apply 4 g topically 2 (two) times daily as needed (pain). Patient not taking: Reported on 07/23/2019 03/04/18   Mack Hook, MD  lidocaine (LIDODERM) 5 % Place 1 patch onto the skin every 12 (twelve) hours. Remove & Discard patch within 12 hours or as directed by MD. At the site of back pain Patient not taking: Reported on 07/23/2019 06/28/19 06/27/20  Flora Lipps, MD    Allergies    Clindamycin hcl, Sulfamethoxazole-trimethoprim, Zanaflex [tizanidine hcl], Metronidazole, Statins, Bee venom, Ace inhibitors, and Adhesive [tape]  Review of Systems   Review of Systems  Constitutional: Negative for chills and fever.  Respiratory: Negative for shortness of breath.   Cardiovascular: Negative for chest pain.  Gastrointestinal: Negative for abdominal pain, nausea and vomiting.  Neurological: Positive for dizziness.  All other systems reviewed and are negative.   Physical Exam Updated Vital Signs BP (!) 163/88 (BP Location: Left Arm)   Pulse 78   Temp 98 F (36.7 C) (Oral)   Resp 15   Ht 5\' 5"  (1.651 m)   Wt 68 kg   SpO2 95%   BMI 24.96 kg/m   Physical Exam Vitals and nursing note reviewed.  Constitutional:      Appearance: She is not ill-appearing or diaphoretic.  HENT:     Head: Normocephalic and  atraumatic.     Right Ear: Tympanic membrane normal.     Left Ear: Tympanic membrane normal.     Mouth/Throat:     Mouth: Mucous membranes are moist.  Eyes:     Extraocular Movements: Extraocular movements intact.     Conjunctiva/sclera: Conjunctivae normal.     Pupils: Pupils are equal, round, and reactive to light.     Comments: No nystagmus  Cardiovascular:     Rate and Rhythm: Normal rate and regular rhythm.     Pulses: Normal pulses.  Pulmonary:     Effort: Pulmonary effort is normal.     Breath sounds: Normal breath sounds. No wheezing, rhonchi or rales.  Abdominal:     Palpations: Abdomen is soft.     Tenderness: There is no abdominal tenderness. There is no guarding or rebound.  Musculoskeletal:     Cervical back: Neck supple.  Skin:    General: Skin is warm and dry.  Neurological:     Mental Status: She is alert.     Comments: CN 3-12 grossly intact A&O x3; does not know the year however can recall that Barbette Or is president [employer] Sensation and strength intact Gait nonataxic including with tandem walking Coordination with finger-to-nose WNL Neg romberg, neg pronator drift     ED Results / Procedures / Treatments   Labs (all labs ordered are listed, but only abnormal results are displayed) Labs Reviewed  URINALYSIS, ROUTINE W REFLEX MICROSCOPIC - Abnormal; Notable for the following components:      Result Value   Nitrite POSITIVE (*)    Leukocytes,Ua SMALL (*)    Bacteria, UA MANY (*)    All other components within normal limits  COMPREHENSIVE METABOLIC PANEL - Abnormal; Notable for the following components:   Potassium 3.3 (*)    Glucose, Bld 131 (*)    All other components within normal limits  CBC - Abnormal; Notable for the following components:   RBC 3.77 (*)    Hemoglobin 11.1 (*)    HCT 34.6 (*)    All other components within normal limits  CBG MONITORING, ED - Abnormal; Notable for the following components:   Glucose-Capillary 104 (*)  All other  components within normal limits  URINE CULTURE  DIFFERENTIAL  CBC WITH DIFFERENTIAL/PLATELET  TROPONIN I (HIGH SENSITIVITY)    EKG EKG Interpretation  Date/Time:  Sunday July 23 2019 07:16:06 EDT Ventricular Rate:  68 PR Interval:    QRS Duration: 92 QT Interval:  468 QTC Calculation: 498 R Axis:   67 Text Interpretation: Sinus rhythm Prolonged PR interval Nonspecific T abnormalities, lateral leads Borderline prolonged QT interval When compared to prior, longer QTC and PR. No STEMI Confirmed by Tegeler, Chris ([id number]) on 07/23/2019 8:03:38 AM   Radiology MR BRAIN WO CONTRAST  Result Date: 07/23/2019 CLINICAL DATA:  TIA.  Dizziness EXAM: MRI HEAD WITHOUT CONTRAST TECHNIQUE: Multiplanar, multiecho pulse sequences of the brain and surrounding structures were obtained without intravenous contrast. COMPARISON:  CT head 05/01/2019 FINDINGS: Brain: Image quality degraded by motion. Rapid scanning technique utilized due to motion also degrading image quality. Negative for acute infarct. Hyperintensity in the left lateral cerebellum. There is some volume loss in this area on coronal T2 suggestive of chronic infarction. No change from the prior CT. Generalized atrophy. Moderate to advanced chronic microvascular ischemic changes throughout the cerebral white matter bilaterally. Negative for hemorrhage or mass. Vascular: Normal arterial flow voids. Skull and upper cervical spine: No focal skeletal lesion. Sinuses/Orbits: Mild mucosal edema left maxillary sinus. Negative orbit Other: None IMPRESSION: No acute abnormality Extensive chronic ischemic change in the white matter. Chronic infarct left lateral cerebellum. Electronically Signed   By: Charles  Clark M.D.   On: 07/23/2019 11:27    Procedures Procedures (including critical care time)  Medications Ordered in ED Medications  HYDROcodone-acetaminophen (NORCO) 10-325 MG per tablet 1 tablet (has no administration in time range)  cefTRIAXone  (ROCEPHIN) 2 g in sodium chloride 0.9 % 100 mL IVPB (0 g Intravenous Stopped 07/23/19 0819)  potassium chloride SA (KLOR-CON) CR tablet 40 mEq (40 mEq Oral Given 07/23/19 0929)    ED Course  I have reviewed the triage vital signs and the nursing notes.  Pertinent labs & imaging results that were available during my care of the patient were reviewed by me and considered in my medical decision making (see chart for details).  Clinical Course as of Jul 23 1207  Sun Jul 23, 2019  0901 Nitrite(!): POSITIVE [MV]  0901 Leukocytes,Ua(!): SMALL [MV]  0906 Potassium(!): 3.3 [MV]    Clinical Course User Index [MV] , , PA-C   MDM Rules/Calculators/A&P                      78  year old female from skilled nursing facility who presents the ED today complaining of dizziness began around 430 this morning.  EMS was called the patient to be evaluated.  When EMS arrived nursing staff reported that patient was noted to have slurring of speech however with EMS patient had no focal neuro deficits and no slurring.  She does have questionable history of TIA.  On arrival to the ED patient is afebrile, nontachycardic and nontachypneic.  Blood pressure 163/88.  Has no complaints currently besides feeling cold in her room.  She does have history of frequent UTIs and states she thinks she may have a UTI at this time.  Patient is alert and oriented to person, place, time.  Clear speech with no slurring.  No facial droop.  Cranial nerves intact.  Strength and sensation intact to all extremities.  Will work-up her dizziness at this time and rule out infection given frequent UTIs.  Will perform  orthostatics.  Given history of TIAs will obtain MRI. Discussed case with attending physician Dr. Sherry Ruffing who has evaluated patient and agrees with plan.   U/A positive for nitrites and leuks. Most recent UTI in January was positive for E coli and pansensitive. Will treat with IV rocephin in the ED.   CBC without  leukocytosis. Hgb stable compared to baseline.  Potassium 3.3, will replete in the ED with [facility]. Glucose 131. No other electrolyte abnormalities.  Troponin 3. Pt without any chest pain or shortness of breath. Do not feel she needs repeat.   MRI without acute changes. Does show chronic changes with old infarct.   Pt was ambulated to the restroom without complaint of dizziness. Feel she is stable for discharge home. Will treat for UTI with keflex pending culture. Prior to discharge pt requesting pain medications; she has hx of chronic back pain and states it has been acting up due to the uncomfortable bed in the ED. She typically takes 10-325 mg Norco for pain; will give tablet in the ED at time of discharge. [facility]AR to take patient home. Strict return precautions have been discussed with pt. She is in agreement with plan and stable for discharge home.   This note was prepared using Dragon voice recognition software and may include unintentional dictation errors due to the inherent limitations of voice recognition software.  Final Clinical Impression(s) / ED Diagnoses Final diagnoses:  Lower urinary tract infectious disease  Dizziness    Rx / DC Orders ED Discharge Orders         Ordered    cephALEXin (KEFLEX) 500 MG capsule  2 times daily     07/23/19 1201           Discharge Instructions     You were found to have a urinary tract infection today Please take medication as prescribed. We have sent the urine for culture and you will receive a call in 2-3 days time if the antibiotic needs to be changed Please follow up with your PCP Dr. Mariea Clonts for further evaluation if your dizziness continues however your MRI did not show any acute findings today.  Return to the ED for any worsening symptoms including worsening dizziness, vision changes, passing out, vomiting, chest pain, shortness of breath, speech changes, weakness or numbness on one side of your body       Eustaquio Maize,  PA-C 07/23/19 1209    Tegeler, Gwenyth Allegra, MD 07/23/19 [phone]

## 2019-07-23 NOTE — ED Triage Notes (Addendum)
79 yr old, african american female Pt comes to ed via ems, c/o dizziness and nursing staff states pt was slurring her wording tonight around 4:30am, ems assessment was negative for deficits.  Pt has dementia at baseline, alert x 4, walks.  Pt was sleeping got out the bed around 4:30am tonight felt dizzy/  and staff wanted her checked out. V/s 142/90, pluse 72, spo2 95 room air, cbg 155, temp 97.0 tympanic.  Pt has a hx of freq UTIs and has been dealing with sinus congestion. Hx of diabetes type II, HTN and thyroid symptomology. Pt just got off antibiotics.

## 2019-07-25 LAB — URINE CULTURE: Culture: >=100000 — AB

## 2019-07-26 ENCOUNTER — Telehealth: Payer: Self-pay | Admitting: Emergency Medicine

## 2019-07-26 NOTE — Telephone Encounter (Signed)
Post ED Visit - Positive Culture Follow-up  Culture report reviewed by antimicrobial stewardship pharmacist: [place] Team []  Nathan Batchelder, Pharm.D. []  [address], Pharm.D., BCPS AQ-ID []  Heide Guile, Pharm.D., BCPS []  Parks Neptune, Pharm.D., BCPS []  [place], Pharm.D., BCPS, AAHIVP []  [place], Pharm.D., BCPS, AAHIVP []  Legrand Como, PharmD, BCPS []  Salome Arnt, PharmD, BCPS []  Johnnette Gourd, PharmD, BCPS []  Hughes Better, PharmD []  Leeroy Cha, PharmD, BCPS []  Laqueta Linden, PharmD  [place] Team []  [address], PharmD []  Leodis Sias, PharmD []  Lindell Spar, PharmD []  Royetta Asal, Rph []  Graylin Shiver) Rema Fendt, PharmD []  Glennon Mac, PharmD []  Arlyn Dunning, PharmD [x]  Netta Cedars, PharmD []  Dia Sitter, PharmD []  Leone Haven, PharmD []  Gretta Arab, PharmD []  Theodis Shove, PharmD []  Peggyann Juba, PharmD   Positive urine culture Treated with cephalexin, organism sensitive to the same and no further patient follow-up is required at this time.  Reuel Boom 07/26/2019, 12:26 PM

## 2019-07-30 ENCOUNTER — Emergency Department (HOSPITAL_COMMUNITY): Payer: Medicare Other

## 2019-07-30 ENCOUNTER — Inpatient Hospital Stay (HOSPITAL_COMMUNITY)
Admission: EM | Admit: 2019-07-30 | Discharge: 2019-08-01 | DRG: 312 | Disposition: A | Discharge status: Nursing Home (Includes ICF) | Payer: Medicare Other | Attending: Infectious Disease | Admitting: Infectious Disease

## 2019-07-30 ENCOUNTER — Other Ambulatory Visit: Payer: Self-pay

## 2019-07-30 ENCOUNTER — Encounter (HOSPITAL_COMMUNITY): Payer: Self-pay | Admitting: Emergency Medicine

## 2019-07-30 DIAGNOSIS — E872 Acidosis, unspecified: Secondary | ICD-10-CM

## 2019-07-30 DIAGNOSIS — Z87440 Personal history of urinary (tract) infections: Secondary | ICD-10-CM | POA: Diagnosis not present

## 2019-07-30 DIAGNOSIS — Z87891 Personal history of nicotine dependence: Secondary | ICD-10-CM

## 2019-07-30 DIAGNOSIS — E878 Other disorders of electrolyte and fluid balance, not elsewhere classified: Secondary | ICD-10-CM | POA: Diagnosis present

## 2019-07-30 DIAGNOSIS — E890 Postprocedural hypothyroidism: Secondary | ICD-10-CM | POA: Diagnosis present

## 2019-07-30 DIAGNOSIS — Z888 Allergy status to other drugs, medicaments and biological substances status: Secondary | ICD-10-CM | POA: Diagnosis not present

## 2019-07-30 DIAGNOSIS — Z8673 Personal history of transient ischemic attack (TIA), and cerebral infarction without residual deficits: Secondary | ICD-10-CM

## 2019-07-30 DIAGNOSIS — K921 Melena: Secondary | ICD-10-CM | POA: Diagnosis not present

## 2019-07-30 DIAGNOSIS — Z813 Family history of other psychoactive substance abuse and dependence: Secondary | ICD-10-CM | POA: Diagnosis not present

## 2019-07-30 DIAGNOSIS — E119 Type 2 diabetes mellitus without complications: Secondary | ICD-10-CM | POA: Diagnosis present

## 2019-07-30 DIAGNOSIS — Z882 Allergy status to sulfonamides status: Secondary | ICD-10-CM

## 2019-07-30 DIAGNOSIS — Z03818 Encounter for observation for suspected exposure to other biological agents ruled out: Secondary | ICD-10-CM | POA: Diagnosis not present

## 2019-07-30 DIAGNOSIS — I119 Hypertensive heart disease without heart failure: Secondary | ICD-10-CM | POA: Diagnosis present

## 2019-07-30 DIAGNOSIS — F0390 Unspecified dementia without behavioral disturbance: Secondary | ICD-10-CM | POA: Diagnosis present

## 2019-07-30 DIAGNOSIS — D61818 Other pancytopenia: Secondary | ICD-10-CM | POA: Diagnosis not present

## 2019-07-30 DIAGNOSIS — S0990XA Unspecified injury of head, initial encounter: Secondary | ICD-10-CM | POA: Diagnosis not present

## 2019-07-30 DIAGNOSIS — E785 Hyperlipidemia, unspecified: Secondary | ICD-10-CM | POA: Diagnosis present

## 2019-07-30 DIAGNOSIS — Z833 Family history of diabetes mellitus: Secondary | ICD-10-CM

## 2019-07-30 DIAGNOSIS — K5900 Constipation, unspecified: Secondary | ICD-10-CM | POA: Diagnosis present

## 2019-07-30 DIAGNOSIS — Z7984 Long term (current) use of oral hypoglycemic drugs: Secondary | ICD-10-CM

## 2019-07-30 DIAGNOSIS — Z79890 Hormone replacement therapy (postmenopausal): Secondary | ICD-10-CM

## 2019-07-30 DIAGNOSIS — R55 Syncope and collapse: Principal | ICD-10-CM | POA: Diagnosis present

## 2019-07-30 DIAGNOSIS — R000 Tachycardia, unspecified: Secondary | ICD-10-CM | POA: Diagnosis present

## 2019-07-30 DIAGNOSIS — I10 Essential (primary) hypertension: Secondary | ICD-10-CM

## 2019-07-30 DIAGNOSIS — Z8249 Family history of ischemic heart disease and other diseases of the circulatory system: Secondary | ICD-10-CM | POA: Diagnosis not present

## 2019-07-30 DIAGNOSIS — Z91048 Other nonmedicinal substance allergy status: Secondary | ICD-10-CM

## 2019-07-30 DIAGNOSIS — R410 Disorientation, unspecified: Secondary | ICD-10-CM | POA: Diagnosis not present

## 2019-07-30 DIAGNOSIS — I440 Atrioventricular block, first degree: Secondary | ICD-10-CM | POA: Diagnosis present

## 2019-07-30 DIAGNOSIS — Z79899 Other long term (current) drug therapy: Secondary | ICD-10-CM

## 2019-07-30 DIAGNOSIS — Z20822 Contact with and (suspected) exposure to covid-19: Secondary | ICD-10-CM | POA: Diagnosis present

## 2019-07-30 DIAGNOSIS — Z811 Family history of alcohol abuse and dependence: Secondary | ICD-10-CM | POA: Diagnosis not present

## 2019-07-30 DIAGNOSIS — R531 Weakness: Secondary | ICD-10-CM | POA: Diagnosis not present

## 2019-07-30 DIAGNOSIS — K922 Gastrointestinal hemorrhage, unspecified: Secondary | ICD-10-CM

## 2019-07-30 DIAGNOSIS — W19XXXA Unspecified fall, initial encounter: Secondary | ICD-10-CM | POA: Diagnosis not present

## 2019-07-30 DIAGNOSIS — R600 Localized edema: Secondary | ICD-10-CM | POA: Diagnosis not present

## 2019-07-30 DIAGNOSIS — I350 Nonrheumatic aortic (valve) stenosis: Secondary | ICD-10-CM | POA: Diagnosis not present

## 2019-07-30 LAB — URINALYSIS, ROUTINE W REFLEX MICROSCOPIC
Bilirubin Urine: NEGATIVE
Glucose, UA: NEGATIVE mg/dL
Hgb urine dipstick: NEGATIVE
Ketones, ur: NEGATIVE mg/dL
Leukocytes,Ua: NEGATIVE
Nitrite: NEGATIVE
Protein, ur: NEGATIVE mg/dL
Specific Gravity, Urine: 1.006 (ref 1.005–1.030)
pH: 9 — ABNORMAL HIGH (ref 5.0–8.0)

## 2019-07-30 LAB — CBC WITH DIFFERENTIAL/PLATELET
Abs Immature Granulocytes: 0.01 10*3/uL (ref 0.00–0.07)
Basophils Absolute: 0 10*3/uL (ref 0.0–0.1)
Basophils Relative: 0 %
Eosinophils Absolute: 0 10*3/uL (ref 0.0–0.5)
Eosinophils Relative: 1 %
HCT: 29.1 % — ABNORMAL LOW (ref 36.0–46.0)
Hemoglobin: 8.7 g/dL — ABNORMAL LOW (ref 12.0–15.0)
Immature Granulocytes: 1 %
Lymphocytes Relative: 33 %
Lymphs Abs: 0.6 10*3/uL — ABNORMAL LOW (ref 0.7–4.0)
MCH: 29.9 pg (ref 26.0–34.0)
MCHC: 29.9 g/dL — ABNORMAL LOW (ref 30.0–36.0)
MCV: 100 fL (ref 80.0–100.0)
Monocytes Absolute: 0.1 10*3/uL (ref 0.1–1.0)
Monocytes Relative: 5 %
Neutro Abs: 1.2 10*3/uL — ABNORMAL LOW (ref 1.7–7.7)
Neutrophils Relative %: 60 %
Platelets: 64 10*3/uL — ABNORMAL LOW (ref 150–400)
RBC: 2.91 MIL/uL — ABNORMAL LOW (ref 3.87–5.11)
RDW: 14.7 % (ref 11.5–15.5)
WBC: 2 10*3/uL — ABNORMAL LOW (ref 4.0–10.5)
nRBC: 0 % (ref 0.0–0.2)

## 2019-07-30 LAB — BASIC METABOLIC PANEL
Anion gap: 8 (ref 5–15)
BUN: 5 mg/dL — ABNORMAL LOW (ref 8–23)
CO2: 15 mmol/L — ABNORMAL LOW (ref 22–32)
Calcium: 5.6 mg/dL — CL (ref 8.9–10.3)
Chloride: 120 mmol/L — ABNORMAL HIGH (ref 98–111)
Creatinine, Ser: 0.46 mg/dL (ref 0.44–1.00)
GFR calc Af Amer: >60 mL/min (ref >60)
GFR calc non Af Amer: >60 mL/min (ref >60)
Glucose, Bld: 88 mg/dL (ref 70–99)
Potassium: 4.9 mmol/L (ref 3.5–5.1)
Sodium: 143 mmol/L (ref 135–145)

## 2019-07-30 LAB — CBG MONITORING, ED
Glucose-Capillary: 116 mg/dL — ABNORMAL HIGH (ref 70–99)
Glucose-Capillary: 162 mg/dL — ABNORMAL HIGH (ref 70–99)

## 2019-07-30 LAB — HEMOGLOBIN A1C
Hgb A1c MFr Bld: 6.7 % — ABNORMAL HIGH (ref 4.8–5.6)
Mean Plasma Glucose: 145.59 mg/dL

## 2019-07-30 LAB — D-DIMER, QUANTITATIVE: D-Dimer, Quant: 3.52 ug/mL-FEU — ABNORMAL HIGH (ref 0.00–0.50)

## 2019-07-30 LAB — POC OCCULT BLOOD, ED: Fecal Occult Bld: NEGATIVE

## 2019-07-30 MED ORDER — ACETAMINOPHEN 650 MG RE SUPP: 650.0000 mg | Freq: Four times a day (QID) | RECTAL | Status: DC | PRN

## 2019-07-30 MED ORDER — SODIUM CHLORIDE 0.9% FLUSH
3.0000 mL | Freq: Two times a day (BID) | INTRAVENOUS | Status: DC
  Administered 2019-07-31 (×2): 3 mL via INTRAVENOUS

## 2019-07-30 MED ORDER — SODIUM CHLORIDE 0.9 % IV SOLN: INTRAVENOUS | Status: DC

## 2019-07-30 MED ORDER — INSULIN ASPART 100 UNIT/ML ~~LOC~~ SOLN: 0.0000 [IU] | Freq: Every day | SUBCUTANEOUS | Status: DC

## 2019-07-30 MED ORDER — INSULIN ASPART 100 UNIT/ML ~~LOC~~ SOLN
0.0000 [IU] | Freq: Three times a day (TID) | SUBCUTANEOUS | Status: DC
  Administered 2019-07-31 – 2019-08-01 (×4): 2 [IU] via SUBCUTANEOUS
  Administered 2019-08-01: 10:00:00 1 [IU] via SUBCUTANEOUS

## 2019-07-30 MED ORDER — SODIUM CHLORIDE 0.9 % IV BOLUS
500.0000 mL | Freq: Once | INTRAVENOUS | Status: AC
  Administered 2019-07-30: 500 mL via INTRAVENOUS

## 2019-07-30 MED ORDER — ACETAMINOPHEN 325 MG PO TABS
650.0000 mg | ORAL_TABLET | Freq: Four times a day (QID) | ORAL | Status: DC | PRN
  Filled 2019-07-30: qty 2

## 2019-07-30 NOTE — ED Notes (Signed)
Family at bedside. 

## 2019-07-30 NOTE — ED Triage Notes (Signed)
To ED via [facility] from [facility]-- per staff-- had a syncopal episode , pt states "I fell out, maybe fainted" hx of dementia, does know she lives at the [facility], unsure of exact date. Speech is clear, states "I think I have a UTI"  C/o bilateral knee pain--

## 2019-07-30 NOTE — H&P (Signed)
History and Physical    Joy Ward [id number] DOB: 12/19/1940 DOA: 07/30/2019  PCP: Gayland Curry, DO Patient coming from: Nursing home  Chief Complaint: Syncope  HPI: Joy Ward is a 79 y.o. female with medical history significant of dementia, CVA, type 2 diabetes, hypertension, hyperlipidemia, hypothyroidism presenting to the ED from her nursing home for evaluation of syncope.  Patient states she lives at a memory care unit of her nursing home.  She remembers she was in her room and then passed out.  She does not recall whether she was standing or what exactly she was doing at that time.  Denies chest pain, shortness of breath, cough, nausea, vomiting, abdominal pain, diarrhea, or dysuria.  States she was diagnosed with a UTI and treated but does not remember how long ago.  Denies previous episodes of syncope.  Denies history of seizures or blood clots.  ED Course: Blood pressure elevated with systolic in the [id number], remainder of vital signs stable.  Labs showing WBC count 2.0, previously normal.   Differential suggestive of neutropenia and mild lymphopenia.  Hemoglobin 8.7, previously in the 11-12 range.  FOBT negative.  Platelet count 64, previously normal.  Chloride 120, bicarb 15, calcium 5.6.  Lab specimen was thought to be possibly contaminated with normal saline and repeat labs were ordered but currently pending.  UA pending.  Head CT negative for acute intracranial abnormality. Chest x-ray negative for acute finding. X-rays of bilateral knees showing no fracture or dislocation. Patient received a 500 cc normal saline bolus.  Review of Systems:  All systems reviewed and apart from history of presenting illness, are negative.  Past Medical History:  Diagnosis Date  . Allergy   . Arthritis   . Chronic back pain   . DDD (degenerative disc disease), lumbar   . Diabetes mellitus without complication ([place])   . Diabetes type 2, uncontrolled ([place]) 09/18/2006   Annotation:  Noninsulin dependent Qualifier: Diagnosis of  By: Amil Amen MD, Benjamine Mola    . Former tobacco use   . Hyperlipidemia    a. H/o muscle aches with statins per PCP notes.  . Hypertension   . Hypothyroidism    a. prior thyroid removal.  . Memory loss or impairment 03/29/2019  . Stroke [facility])    a. Listed in PCP notes: "History of stroke:  History of terrible headache.  Had a CT scan of brain and found "ministroke"  Was removed from ERT subsequently. "    Past Surgical History:  Procedure Laterality Date  . ABDOMINAL HYSTERECTOMY  1994   TAH, not clear if unilateral oophorectomy as well.  Marland Kitchen [place]  . CHOLECYSTECTOMY  1973   open  . DECOMPRESSIVE LUMBAR LAMINECTOMY LEVEL 4  2005   In [place]  . THYROIDECTOMY  2012   multinodular goiter     reports that she has quit smoking. She has never used smokeless tobacco. She reports that she does not drink alcohol or use drugs.  Allergies  Allergen Reactions  . Clindamycin Hcl Other (See Comments)    REACTION: C. difficile colitis  . Sulfamethoxazole-Trimethoprim Other (See Comments)     Eye redness  . Zanaflex [Tizanidine Hcl] Anaphylaxis  . Metronidazole Hives  . Statins Other (See Comments)    Side Effect: muscle pain Has tried Zocor, Crestor--she does not want to take a statin, period  . Bee Venom Other (See Comments)    Unknown reaction  . Ace Inhibitors Cough  . Adhesive [Tape] Itching, Rash  and Other (See Comments)    Blistering of skin     Family History  Problem Relation Age of Onset  . Heart disease Mother        unclear details "atherosclerosis"  . Diabetes Mother   . Alcohol abuse Sister   . Diabetes Brother   . Diabetes Brother   . Drug abuse Brother   . Diabetes Daughter   . Heart disease Son        CHF  . Alcohol abuse Son     Prior to Admission medications   Medication Sig Start Date End Date Taking? Authorizing Provider  acetaminophen (TYLENOL) 325 MG tablet Take 2 tablets  (650 mg total) by mouth every 6 (six) hours as needed for mild pain (or Fever >/= 101). 06/28/19   Pokhrel, Corrie Mckusick, MD  amLODipine (NORVASC) 5 MG tablet Take 1 tablet (5 mg total) by mouth daily. 04/05/19   Mack Hook, MD  bimatoprost (LUMIGAN) 0.01 % SOLN Place 1 drop into the left eye at bedtime. Patient not taking: Reported on 07/23/2019 03/04/18   Mack Hook, MD  bisacodyl (DULCOLAX) 10 MG suppository Place 1 suppository (10 mg total) rectally daily as needed for moderate constipation. 06/28/19   Pokhrel, Corrie Mckusick, MD  diclofenac sodium (VOLTAREN) 1 % GEL Apply 4 g topically 2 (two) times daily as needed (pain). Patient not taking: Reported on 07/23/2019 03/04/18   Mack Hook, MD  donepezil (ARICEPT) 5 MG tablet Take 1 tablet (5 mg total) by mouth at bedtime. 04/05/19   Mack Hook, MD  furosemide (LASIX) 20 MG tablet Take 1 tablet (20 mg total) by mouth daily. 05/15/19   Reed, Tiffany L, DO  levothyroxine (SYNTHROID) 75 MCG tablet Take 1 tablet (75 mcg total) by mouth daily before breakfast. 04/14/19   Reed, Tiffany L, DO  lidocaine (LIDODERM) 5 % Place 1 patch onto the skin every 12 (twelve) hours. Remove & Discard patch within 12 hours or as directed by MD. At the site of back pain Patient not taking: Reported on 07/23/2019 06/28/19 06/27/20  Flora Lipps, MD  loperamide (IMODIUM) 2 MG capsule Take 2 mg by mouth as needed for diarrhea or loose stools (do not exceed 8 dsoses in 24 hours).    [provider]  losartan (COZAAR) 100 MG tablet Take 1 tablet (100 mg total) by mouth daily. 04/05/19   Mack Hook, MD  metFORMIN (GLUCOPHAGE-XR) 500 MG 24 hr tablet 1 tab by mouth twice daily with meals Patient taking differently: Take 500 mg by mouth 2 (two) times daily with a meal.  05/15/19   Reed, Tiffany L, DO  Omega-3 Fatty Acids (FISH OIL) 1000 MG CAPS Take 1,000 mg by mouth daily.     [provider]  polyethylene glycol (MIRALAX / GLYCOLAX) 17 g  packet Take 17 g by mouth daily as needed for mild constipation. 07/03/19   Reed, Tiffany L, DO  pravastatin (PRAVACHOL) 20 MG tablet Take 1 tablet (20 mg total) by mouth daily at 6 PM. 07/17/19   Reed, Tiffany L, DO  QUEtiapine (SEROQUEL) 100 MG tablet Take 1 tablet (100 mg total) by mouth at bedtime. 07/18/19   Gayland Curry, DO    Physical Exam: Vitals:   07/30/19 2000 07/30/19 2015 07/30/19 2030 07/30/19 2045  BP: (!) 153/84  134/71   Pulse: 86 87 85 86  Resp: (!) 0 18 12 18   Temp:      TempSrc:      SpO2: 98% 100% 99% 100%  Weight:      Height:        Physical Exam  Constitutional: She is oriented to person, place, and time. She appears well-developed and well-nourished. No distress.  HENT:  Head: Normocephalic.  Eyes: EOM are normal. Right eye exhibits no discharge. Left eye exhibits no discharge.  Cardiovascular: Normal rate, regular rhythm and intact distal pulses.  Pulmonary/Chest: Effort normal and breath sounds normal. No respiratory distress. She has no wheezes. She has no rales.  Abdominal: Soft. Bowel sounds are normal. She exhibits no distension. There is no abdominal tenderness. There is no guarding.  Musculoskeletal:        General: No edema.     Cervical back: Neck supple.  Neurological: She is alert and oriented to person, place, and time.  Speech fluent, tongue midline, no facial droop Strength 5 out of 5 in bilateral upper and lower extremities. Sensation to light touch intact throughout.  Skin: Skin is warm and dry. She is not diaphoretic.     Labs on Admission: I have personally reviewed following labs and imaging studies  CBC: Recent Labs  Lab 07/30/19 1854  WBC 2.0*  NEUTROABS 1.2*  HGB 8.7*  HCT 29.1*  MCV 100.0  PLT 64*   Basic Metabolic Panel: Recent Labs  Lab 07/30/19 1854  NA 143  K 4.9  CL 120*  CO2 15*  GLUCOSE 88  BUN 5*  CREATININE 0.46  CALCIUM 5.6*   GFR: Estimated Creatinine Clearance: 53 mL/min (by C-G formula based  on SCr of 0.46 mg/dL). Liver Function Tests: No results for input(s): AST, ALT, ALKPHOS, BILITOT, PROT, ALBUMIN in the last 168 hours. No results for input(s): LIPASE, AMYLASE in the last 168 hours. No results for input(s): AMMONIA in the last 168 hours. Coagulation Profile: No results for input(s): INR, PROTIME in the last 168 hours. Cardiac Enzymes: No results for input(s): CKTOTAL, CKMB, CKMBINDEX, TROPONINI in the last 168 hours. BNP (last 3 results) No results for input(s): PROBNP in the last 8760 hours. HbA1C: No results for input(s): HGBA1C in the last 72 hours. CBG: Recent Labs  Lab 07/30/19 1823  GLUCAP 116*   Lipid Profile: No results for input(s): CHOL, HDL, LDLCALC, TRIG, CHOLHDL, LDLDIRECT in the last 72 hours. Thyroid Function Tests: No results for input(s): TSH, T4TOTAL, FREET4, T3FREE, THYROIDAB in the last 72 hours. Anemia Panel: No results for input(s): VITAMINB12, FOLATE, FERRITIN, TIBC, IRON, RETICCTPCT in the last 72 hours. Urine analysis:    Component Value Date/Time   COLORURINE STRAW (A) 07/30/2019 2125   APPEARANCEUR CLEAR 07/30/2019 2125   LABSPEC 1.006 07/30/2019 2125   PHURINE 9.0 (H) 07/30/2019 2125   GLUCOSEU NEGATIVE 07/30/2019 2125   HGBUR NEGATIVE 07/30/2019 2125   HGBUR negative 02/14/2009 0938   BILIRUBINUR NEGATIVE 07/30/2019 2125   KETONESUR NEGATIVE 07/30/2019 2125   PROTEINUR NEGATIVE 07/30/2019 2125   UROBILINOGEN 0.2 06/18/2019 1708   NITRITE NEGATIVE 07/30/2019 2125   LEUKOCYTESUR NEGATIVE 07/30/2019 2125    Radiological Exams on Admission: DG Knee 2 Views Right  Result Date: 07/30/2019 CLINICAL DATA:  Syncope, knee pain EXAM: RIGHT KNEE - 1-2 VIEW; LEFT KNEE - COMPLETE 4+ VIEW COMPARISON:  None. FINDINGS: No fracture or dislocation of the bilateral knees. Moderate arthrosis of the patellofemoral compartments. No knee joint effusion. Soft tissue edema anteriorly. IMPRESSION: No fracture or dislocation of the bilateral knees.  Electronically Signed   By: Eddie Candle M.D.   On: 07/30/2019 18:15   CT Head Wo Contrast  Result  Date: 07/30/2019 CLINICAL DATA:  Syncopal episode. Head trauma, minor (Age >= 65y) EXAM: CT HEAD WITHOUT CONTRAST TECHNIQUE: Contiguous axial images were obtained from the base of the skull through the vertex without intravenous contrast. COMPARISON:  Brain MRI 1 week ago 07/23/2019, and CT 05/01/2019 FINDINGS: Brain: Stable degree of atrophy. Stable advanced chronic small vessel ischemia. No intracranial hemorrhage, mass effect, or midline shift. No hydrocephalus. The basilar cisterns are patent. Remote left cerebellar infarct with is better defined on recent MRI. No evidence of territorial infarct or acute ischemia. No extra-axial or intracranial fluid collection. Vascular: Atherosclerosis of skullbase vasculature without hyperdense vessel or abnormal calcification. Skull: No fracture or focal lesion. Sinuses/Orbits: Mucous retention cyst left maxillary sinus. The mastoid air cells are clear. The visualized orbits are unremarkable. Other: None. IMPRESSION: 1. No acute intracranial abnormality. 2. Stable atrophy and advanced chronic small vessel ischemia. Remote left cerebellar infarct was better delineated on recent MRI. Electronically Signed   By: Keith Rake M.D.   On: 07/30/2019 17:44   DG Chest Port 1 View  Result Date: 07/30/2019 CLINICAL DATA:  Syncope, knee pain EXAM: PORTABLE CHEST 1 VIEW COMPARISON:  06/24/2019 FINDINGS: The heart size and mediastinal contours are within normal limits. Both lungs are clear. The visualized skeletal structures are unremarkable. IMPRESSION: Normal study. Electronically Signed   By: Rolm Baptise M.D.   On: 07/30/2019 18:15   DG Knee Complete 4 Views Left  Result Date: 07/30/2019 CLINICAL DATA:  Syncope, knee pain EXAM: RIGHT KNEE - 1-2 VIEW; LEFT KNEE - COMPLETE 4+ VIEW COMPARISON:  None. FINDINGS: No fracture or dislocation of the bilateral knees. Moderate  arthrosis of the patellofemoral compartments. No knee joint effusion. Soft tissue edema anteriorly. IMPRESSION: No fracture or dislocation of the bilateral knees. Electronically Signed   By: Eddie Candle M.D.   On: 07/30/2019 18:15    EKG: Independently reviewed.  Sinus rhythm.  First-degree AV block similar to prior tracing from 07/23/2019.  Poor quality study with artifact in several leads.  Assessment/Plan Principal Problem:   Syncope Active Problems:   Pancytopenia (HCC)   Hyperchloremia   Metabolic acidosis   Hypocalcemia   Syncope: Differentials include orthostatic, vasovagal, arrhythmia/structural heart disease, and possible seizure activity.  Acute stroke less likely as head CT negative for acute intracranial abnormality and neuro exam nonfocal.  EKG showing first-degree AV block.  Plan is to continue cardiac monitoring and check TSH level.  Order orthostatics, echocardiogram, carotid Dopplers, and EEG.  PE less likely given no tachycardia or hypoxia, check D-dimer level.  Pancytopenia: Lab abnormalities appear new compared to labs done a week ago.  Repeat CBC has been ordered for confirmation and currently pending.  If repeat CBC confirms pancytopenia, order peripheral blood smear and anemia panel.  FOBT negative.  Hyperchloremia, metabolic acidosis, hypocalcemia: Lab abnormalities appear new compared to labs done a week ago. Lab specimen was thought to be possibly contaminated with normal saline and repeat BMP was ordered but currently pending.  Repeat labs confirm these findings, give bicarb and calcium supplementation.  Noninsulin-dependent type 2 diabetes: Check A1c.  Sliding scale insulin sensitive ACHS and CBG checks.  Hold home Metformin.  Hypertension: Currently normotensive.  Hold antihypertensives at this time, orthostatics pending.  Pharmacy med rec pending.  DVT prophylaxis: SCDs given concern for thrombocytopenia Code Status: Patient wishes to be full code. Family  Communication: No family available at this time. Disposition Plan: Anticipate discharge after clinical improvement. Consults called: None Admission status: It is my  clinical opinion that referral for OBSERVATION is reasonable and necessary in this patient based on the above information provided. The aforementioned taken together are felt to place the patient at high risk for further clinical deterioration. However it is anticipated that the patient may be medically stable for discharge from the hospital within 24 to 48 hours.  The medical decision making on this patient was of high complexity and the patient is at high risk for clinical deterioration, therefore this is a level 3 visit.  Shela Leff MD [facility]  If 7PM-7AM, please contact night-coverage [email or web address]  07/30/2019, 9:45 PM

## 2019-07-30 NOTE — ED Notes (Signed)
Patient is resting comfortably. 

## 2019-07-30 NOTE — ED Provider Notes (Signed)
[facility] EMERGENCY DEPARTMENT Provider Note   CSN: [id number] Arrival date & time: 07/30/19  1647     History Chief Complaint  Patient presents with  . Loss of Consciousness    Joy Ward is a 79 y.o. female.  HPI   Patient presented to the ED for evaluation after a syncopal episode.  Patient is a resident of [facility].  Patient thinks she may have fainted as she was walking to the bathroom.  She is not sure.  Patient was recently diagnosed with a urinary tract infection.  Denies any trouble with headache or chest pain.  She denies any focal numbness or weakness.  Past Medical History:  Diagnosis Date  . Allergy   . Arthritis   . Chronic back pain   . DDD (degenerative disc disease), lumbar   . Diabetes mellitus without complication ([facility])   . Diabetes type 2, uncontrolled ([place]) 09/18/2006   Annotation: Noninsulin dependent Qualifier: Diagnosis of  By: Amil Amen MD, Benjamine Mola    . Former tobacco use   . Hyperlipidemia    a. H/o muscle aches with statins per PCP notes.  . Hypertension   . Hypothyroidism    a. prior thyroid removal.  . Memory loss or impairment 03/29/2019  . Stroke [facility])    a. Listed in PCP notes: "History of stroke:  History of terrible headache.  Had a CT scan of brain and found "ministroke"  Was removed from ERT subsequently. "    Patient Active Problem List   Diagnosis Date Noted  . Palliative care patient 07/03/2019  . Hallucinations 06/24/2019  . Metabolic encephalopathy [id number]  . Altered mental status   . UTI (urinary tract infection) 04/30/2019  . Dementia without behavioral disturbance ([place])   . Vascular dementia with behavior disturbance ([place]) 04/14/2019  . Primary insomnia 04/14/2019  . Hypothyroidism 04/14/2019  . Diabetes mellitus ([place]) 04/14/2019  . Memory loss or impairment 03/29/2019  . Diabetic peripheral neuropathy associated with type 2 diabetes mellitus ([place]) 12/29/2017  . Peripheral edema 12/29/2017   . Elevated liver enzymes 10/15/2016  . Chest pain 04/22/2016  . Systolic murmur [id number]  . Hypercalcemia 04/22/2016  . Hyperlipidemia 04/22/2016  . Baker's cyst of knee, right 04/21/2016  . HYPOKALEMIA 01/24/2010  . CONSTIPATION 12/23/2009  . INTERMITTENT VERTIGO 12/23/2009  . SINUSITIS, ACUTE 04/25/2009  . VAGINITIS, CANDIDAL 02/14/2009  . HIP PAIN, RIGHT 02/14/2009  . HYPERLIPIDEMIA, MIXED 01/01/2009  . BACK PAIN, LUMBAR, WITH RADICULOPATHY 12/25/2008  . KNEE PAIN, BILATERAL 12/18/2008  . TROCHANTERIC BURSITIS, BILATERAL 12/18/2008  . Disturbance in sleep behavior 12/18/2008  . ONYCHOMYCOSIS, TOENAILS 10/30/2008  . ENTERITIS, CLOSTRIDIUM DIFFICILE 10/15/2006  . Diabetes type 2, uncontrolled ([place]) 09/18/2006  . Essential hypertension 09/18/2006  . Allergic rhinitis 09/18/2006  . GOITER, MULTINODULAR 06/29/2006  . STROKE 04/27/2001    Past Surgical History:  Procedure Laterality Date  . ABDOMINAL HYSTERECTOMY  1994   TAH, not clear if unilateral oophorectomy as well.  [facility] [place]  . CHOLECYSTECTOMY  1973   open  . DECOMPRESSIVE LUMBAR LAMINECTOMY LEVEL 4  2005   In [place]  . THYROIDECTOMY  2012   multinodular goiter     OB History   No obstetric history on file.     Family History  Problem Relation Age of Onset  . Heart disease Mother        unclear details "atherosclerosis"  . Diabetes Mother   . Alcohol abuse Sister   . Diabetes  Brother   . Diabetes Brother   . Drug abuse Brother   . Diabetes Daughter   . Heart disease Son        CHF  . Alcohol abuse Son     Social History   Tobacco Use  . Smoking status: Former Research scientist (life sciences)  . Smokeless tobacco: Never Used  . Tobacco comment: No smoking since 79 yo - smoked 10 yrs  Substance Use Topics  . Alcohol use: No  . Drug use: No    Home Medications Prior to Admission medications   Medication Sig Start Date End Date Taking? Authorizing Provider  acetaminophen (TYLENOL) 325  MG tablet Take 2 tablets (650 mg total) by mouth every 6 (six) hours as needed for mild pain (or Fever >/= 101). 06/28/19   Pokhrel, Corrie Mckusick, MD  amLODipine (NORVASC) 5 MG tablet Take 1 tablet (5 mg total) by mouth daily. 04/05/19   Mack Hook, MD  bimatoprost (LUMIGAN) 0.01 % SOLN Place 1 drop into the left eye at bedtime. Patient not taking: Reported on 07/23/2019 03/04/18   Mack Hook, MD  bisacodyl (DULCOLAX) 10 MG suppository Place 1 suppository (10 mg total) rectally daily as needed for moderate constipation. 06/28/19   Pokhrel, Corrie Mckusick, MD  diclofenac sodium (VOLTAREN) 1 % GEL Apply 4 g topically 2 (two) times daily as needed (pain). Patient not taking: Reported on 07/23/2019 03/04/18   Mack Hook, MD  donepezil (ARICEPT) 5 MG tablet Take 1 tablet (5 mg total) by mouth at bedtime. 04/05/19   Mack Hook, MD  furosemide (LASIX) 20 MG tablet Take 1 tablet (20 mg total) by mouth daily. 05/15/19   Reed, Tiffany L, DO  levothyroxine (SYNTHROID) 75 MCG tablet Take 1 tablet (75 mcg total) by mouth daily before breakfast. 04/14/19   Reed, Tiffany L, DO  lidocaine (LIDODERM) 5 % Place 1 patch onto the skin every 12 (twelve) hours. Remove & Discard patch within 12 hours or as directed by MD. At the site of back pain Patient not taking: Reported on 07/23/2019 06/28/19 06/27/20  Flora Lipps, MD  loperamide (IMODIUM) 2 MG capsule Take 2 mg by mouth as needed for diarrhea or loose stools (do not exceed 8 dsoses in 24 hours).    [provider]  losartan (COZAAR) 100 MG tablet Take 1 tablet (100 mg total) by mouth daily. 04/05/19   Mack Hook, MD  metFORMIN (GLUCOPHAGE-XR) 500 MG 24 hr tablet 1 tab by mouth twice daily with meals Patient taking differently: Take 500 mg by mouth 2 (two) times daily with a meal.  05/15/19   Reed, Tiffany L, DO  Omega-3 Fatty Acids (FISH OIL) 1000 MG CAPS Take 1,000 mg by mouth daily.     [provider]  polyethylene glycol  (MIRALAX / GLYCOLAX) 17 g packet Take 17 g by mouth daily as needed for mild constipation. 07/03/19   Reed, Tiffany L, DO  pravastatin (PRAVACHOL) 20 MG tablet Take 1 tablet (20 mg total) by mouth daily at 6 PM. 07/17/19   Reed, Tiffany L, DO  QUEtiapine (SEROQUEL) 100 MG tablet Take 1 tablet (100 mg total) by mouth at bedtime. 07/18/19   Reed, Tiffany L, DO    Allergies    Clindamycin hcl, Sulfamethoxazole-trimethoprim, Zanaflex [tizanidine hcl], Metronidazole, Statins, Bee venom, Ace inhibitors, and Adhesive [tape]  Review of Systems   Review of Systems  All other systems reviewed and are negative.   Physical Exam Updated Vital Signs BP (!) 156/90 (BP Location: Right Arm)  Pulse 78   Temp 97.8 F (36.6 C) (Oral)   Resp 16   Ht 1.651 m (5\' 5" )   Wt 68 kg   SpO2 100%   BMI 24.95 kg/m   Physical Exam Vitals and nursing note reviewed.  Constitutional:      General: She is not in acute distress.    Appearance: She is well-developed.  HENT:     Head: Normocephalic and atraumatic.     Right Ear: External ear normal.     Left Ear: External ear normal.  Eyes:     General: No scleral icterus.       Right eye: No discharge.        Left eye: No discharge.     Conjunctiva/sclera: Conjunctivae normal.  Neck:     Trachea: No tracheal deviation.  Cardiovascular:     Rate and Rhythm: Normal rate and regular rhythm.  Pulmonary:     Effort: Pulmonary effort is normal. No respiratory distress.     Breath sounds: Normal breath sounds. No stridor. No wheezing or rales.  Abdominal:     General: Bowel sounds are normal. There is no distension.     Palpations: Abdomen is soft.     Tenderness: There is no abdominal tenderness. There is no guarding or rebound.  Genitourinary:    Comments: Guaiac negative, brown stool Musculoskeletal:        General: No tenderness.     Cervical back: Neck supple.  Skin:    General: Skin is warm and dry.     Findings: No rash.  Neurological:     Mental  Status: She is alert. Mental status is at baseline.     Cranial Nerves: No cranial nerve deficit (No facial droop, extraocular movements intact, tongue midline ).     Sensory: No sensory deficit.     Motor: No abnormal muscle tone or seizure activity.     Coordination: Coordination normal.     Comments: No pronator drift bilateral upper extrem, able to hold both legs off bed for 5 seconds, sensation intact in all extremities, no visual field cuts, no left or right sided neglect, normal finger-nose exam bilaterally, no nystagmus noted  Patient is alert and awake.  She is aware of her surroundings.  She answers questions appropriately.  She is not sure of the date which is usual for her     ED Results / Procedures / Treatments   Labs (all labs ordered are listed, but only abnormal results are displayed) Labs Reviewed  BASIC METABOLIC PANEL - Abnormal; Notable for the following components:      Result Value   Chloride 120 (*)    CO2 15 (*)    BUN 5 (*)    Calcium 5.6 (*)    All other components within normal limits  CBC WITH DIFFERENTIAL/PLATELET - Abnormal; Notable for the following components:   WBC 2.0 (*)    RBC 2.91 (*)    Hemoglobin 8.7 (*)    HCT 29.1 (*)    MCHC 29.9 (*)    Platelets 64 (*)    Neutro Abs 1.2 (*)    Lymphs Abs 0.6 (*)    All other components within normal limits  CBG MONITORING, ED - Abnormal; Notable for the following components:   Glucose-Capillary 116 (*)    All other components within normal limits  URINALYSIS, ROUTINE W REFLEX MICROSCOPIC  CBC  BASIC METABOLIC PANEL  POC OCCULT BLOOD, ED    EKG EKG Interpretation  Date/Time:  Sunday July 30 2019 17:14:09 EDT Ventricular Rate:  76 PR Interval:    QRS Duration: 85 QT Interval:  419 QTC Calculation: 472 R Axis:   49 Text Interpretation: Sinus rhythm Prolonged PR interval Probable left atrial enlargement Anteroseptal infarct, age indeterminate Poor data quality Otherwise no significant change  Confirmed by Dorie Rank [phone]) on 07/30/2019 5:16:17 PM   Radiology DG Knee 2 Views Right  Result Date: 07/30/2019 CLINICAL DATA:  Syncope, knee pain EXAM: RIGHT KNEE - 1-2 VIEW; LEFT KNEE - COMPLETE 4+ VIEW COMPARISON:  None. FINDINGS: No fracture or dislocation of the bilateral knees. Moderate arthrosis of the patellofemoral compartments. No knee joint effusion. Soft tissue edema anteriorly. IMPRESSION: No fracture or dislocation of the bilateral knees. Electronically Signed   By: Eddie Candle M.D.   On: 07/30/2019 18:15   CT Head Wo Contrast  Result Date: 07/30/2019 CLINICAL DATA:  Syncopal episode. Head trauma, minor (Age >= 65y) EXAM: CT HEAD WITHOUT CONTRAST TECHNIQUE: Contiguous axial images were obtained from the base of the skull through the vertex without intravenous contrast. COMPARISON:  Brain MRI 1 week ago 07/23/2019, and CT 05/01/2019 FINDINGS: Brain: Stable degree of atrophy. Stable advanced chronic small vessel ischemia. No intracranial hemorrhage, mass effect, or midline shift. No hydrocephalus. The basilar cisterns are patent. Remote left cerebellar infarct with is better defined on recent MRI. No evidence of territorial infarct or acute ischemia. No extra-axial or intracranial fluid collection. Vascular: Atherosclerosis of skullbase vasculature without hyperdense vessel or abnormal calcification. Skull: No fracture or focal lesion. Sinuses/Orbits: Mucous retention cyst left maxillary sinus. The mastoid air cells are clear. The visualized orbits are unremarkable. Other: None. IMPRESSION: 1. No acute intracranial abnormality. 2. Stable atrophy and advanced chronic small vessel ischemia. Remote left cerebellar infarct was better delineated on recent MRI. Electronically Signed   By: Keith Rake M.D.   On: 07/30/2019 17:44   DG Chest Port 1 View  Result Date: 07/30/2019 CLINICAL DATA:  Syncope, knee pain EXAM: PORTABLE CHEST 1 VIEW COMPARISON:  06/24/2019 FINDINGS: The heart size and  mediastinal contours are within normal limits. Both lungs are clear. The visualized skeletal structures are unremarkable. IMPRESSION: Normal study. Electronically Signed   By: Rolm Baptise M.D.   On: 07/30/2019 18:15   DG Knee Complete 4 Views Left  Result Date: 07/30/2019 CLINICAL DATA:  Syncope, knee pain EXAM: RIGHT KNEE - 1-2 VIEW; LEFT KNEE - COMPLETE 4+ VIEW COMPARISON:  None. FINDINGS: No fracture or dislocation of the bilateral knees. Moderate arthrosis of the patellofemoral compartments. No knee joint effusion. Soft tissue edema anteriorly. IMPRESSION: No fracture or dislocation of the bilateral knees. Electronically Signed   By: Eddie Candle M.D.   On: 07/30/2019 18:15    Procedures Procedures (including critical care time)  Medications Ordered in ED Medications  sodium chloride 0.9 % bolus 500 mL (500 mLs Intravenous New Bag/Given 07/30/19 1859)    And  0.9 %  sodium chloride infusion (has no administration in time range)    ED Course  I have reviewed the triage vital signs and the nursing notes.  Pertinent labs & imaging results that were available during my care of the patient were reviewed by me and considered in my medical decision making (see chart for details).  Clinical Course as of Jul 29 2024  Nancy Fetter Jul 30, 2019  2025 Labs notable for multiple abnormalities.  Patient has a low white blood cell count, red blood cell count and platelet count.  Electrolyte panel also shows a low bicarb as well as calcium level and an elevated chloride.  I am suspicious that her lab draw may have been contaminated by saline.  We will repeat.   [JK]  2025 Patient does remain hemodynamically stable.  Considering her syncopal episode and these lab abnormalities I will consult with the medical service for admission and observation.   [JK]    Clinical Course User Index [JK] Dorie Rank, MD   MDM Rules/Calculators/A&P                      Patient presented to ED for evaluation of a syncopal  episode.  Patient's physical exam is reassuring.  No focal deficits at this time.  I doubt acute stroke.  Laboratory tests however do show several abnormalities.  Question if this is a lab error.  However considering these abnormalities, her risk factors at her age I will consult the medical service for admission and observation. Final Clinical Impression(s) / ED Diagnoses Final diagnoses:  Syncope and collapse  Pancytopenia ([place])  Hypocalcemia    Rx / DC Orders ED Discharge Orders    None       Dorie Rank, MD 07/30/19 2027

## 2019-07-31 ENCOUNTER — Ambulatory Visit (HOSPITAL_COMMUNITY): Payer: Medicare Other

## 2019-07-31 ENCOUNTER — Observation Stay (HOSPITAL_COMMUNITY): Payer: Medicare Other

## 2019-07-31 ENCOUNTER — Observation Stay (HOSPITAL_BASED_OUTPATIENT_CLINIC_OR_DEPARTMENT_OTHER): Payer: Medicare Other

## 2019-07-31 DIAGNOSIS — I350 Nonrheumatic aortic (valve) stenosis: Secondary | ICD-10-CM | POA: Diagnosis not present

## 2019-07-31 DIAGNOSIS — R55 Syncope and collapse: Secondary | ICD-10-CM | POA: Diagnosis not present

## 2019-07-31 DIAGNOSIS — K922 Gastrointestinal hemorrhage, unspecified: Secondary | ICD-10-CM | POA: Diagnosis not present

## 2019-07-31 DIAGNOSIS — K921 Melena: Secondary | ICD-10-CM | POA: Diagnosis not present

## 2019-07-31 LAB — COMPREHENSIVE METABOLIC PANEL
ALT: 21 U/L (ref 0–44)
AST: 25 U/L (ref 15–41)
Albumin: 3.4 g/dL — ABNORMAL LOW (ref 3.5–5.0)
Alkaline Phosphatase: 59 U/L (ref 38–126)
Anion gap: 11 (ref 5–15)
BUN: 7 mg/dL — ABNORMAL LOW (ref 8–23)
CO2: 24 mmol/L (ref 22–32)
Calcium: 9.3 mg/dL (ref 8.9–10.3)
Chloride: 105 mmol/L (ref 98–111)
Creatinine, Ser: 0.78 mg/dL (ref 0.44–1.00)
GFR calc Af Amer: >60 mL/min (ref >60)
GFR calc non Af Amer: >60 mL/min (ref >60)
Glucose, Bld: 195 mg/dL — ABNORMAL HIGH (ref 70–99)
Potassium: 3.3 mmol/L — ABNORMAL LOW (ref 3.5–5.1)
Sodium: 140 mmol/L (ref 135–145)
Total Bilirubin: 0.7 mg/dL (ref 0.3–1.2)
Total Protein: 6.3 g/dL — ABNORMAL LOW (ref 6.5–8.1)

## 2019-07-31 LAB — CBC
HCT: 37.9 % (ref 36.0–46.0)
Hemoglobin: 12.2 g/dL (ref 12.0–15.0)
MCH: 29.2 pg (ref 26.0–34.0)
MCHC: 32.2 g/dL (ref 30.0–36.0)
MCV: 90.7 fL (ref 80.0–100.0)
Platelets: 204 10*3/uL (ref 150–400)
RBC: 4.18 MIL/uL (ref 3.87–5.11)
RDW: 13.9 % (ref 11.5–15.5)
WBC: 5.5 10*3/uL (ref 4.0–10.5)
nRBC: 0 % (ref 0.0–0.2)

## 2019-07-31 LAB — RESPIRATORY PANEL BY RT PCR (FLU A&B, COVID)
Influenza A by PCR: NEGATIVE
Influenza B by PCR: NEGATIVE
SARS Coronavirus 2 by RT PCR: NEGATIVE

## 2019-07-31 LAB — GLUCOSE, CAPILLARY
Glucose-Capillary: 182 mg/dL — ABNORMAL HIGH (ref 70–99)
Glucose-Capillary: 152 mg/dL — ABNORMAL HIGH (ref 70–99)
Glucose-Capillary: 164 mg/dL — ABNORMAL HIGH (ref 70–99)
Glucose-Capillary: 141 mg/dL — ABNORMAL HIGH (ref 70–99)

## 2019-07-31 LAB — TSH: TSH: 39.015 u[IU]/mL — ABNORMAL HIGH (ref 0.350–4.500)

## 2019-07-31 LAB — T4, FREE: Free T4: 0.42 ng/dL — ABNORMAL LOW (ref 0.61–1.12)

## 2019-07-31 LAB — ECHOCARDIOGRAM COMPLETE
Height: 65 in
Weight: 2398.6 oz

## 2019-07-31 MED ORDER — LEVOTHYROXINE SODIUM 75 MCG PO TABS
75.0000 ug | ORAL_TABLET | Freq: Every day | ORAL | Status: DC
  Administered 2019-07-31 – 2019-08-01 (×2): 75 ug via ORAL
  Filled 2019-07-31 (×2): qty 1

## 2019-07-31 MED ORDER — POTASSIUM CHLORIDE CRYS ER 20 MEQ PO TBCR
20.0000 meq | EXTENDED_RELEASE_TABLET | Freq: Once | ORAL | Status: AC
  Administered 2019-07-31: 06:00:00 20 meq via ORAL
  Filled 2019-07-31: qty 1

## 2019-07-31 MED ORDER — OXYCODONE HCL 5 MG PO TABS
5.0000 mg | ORAL_TABLET | Freq: Once | ORAL | Status: AC
  Administered 2019-07-31: 08:00:00 5 mg via ORAL
  Filled 2019-07-31: qty 1

## 2019-07-31 MED ORDER — MAGNESIUM HYDROXIDE 400 MG/5ML PO SUSP: 30.0000 mL | Freq: Every evening | ORAL | Status: DC | PRN

## 2019-07-31 MED ORDER — GUAIFENESIN 100 MG/5ML PO SOLN
200.0000 mg | Freq: Four times a day (QID) | ORAL | Status: DC | PRN
  Filled 2019-07-31: qty 10

## 2019-07-31 MED ORDER — POTASSIUM CHLORIDE CRYS ER 20 MEQ PO TBCR
20.0000 meq | EXTENDED_RELEASE_TABLET | Freq: Once | ORAL | Status: AC
  Administered 2019-07-31: 20 meq via ORAL
  Filled 2019-07-31: qty 1

## 2019-07-31 MED ORDER — ALUM & MAG HYDROXIDE-SIMETH 200-200-20 MG/5ML PO SUSP: 30.0000 mL | Freq: Four times a day (QID) | ORAL | Status: DC | PRN

## 2019-07-31 MED ORDER — POLYETHYLENE GLYCOL 3350 17 G PO PACK
17.0000 g | PACK | Freq: Every day | ORAL | Status: DC | PRN
  Administered 2019-08-01: 16:00:00 17 g via ORAL
  Filled 2019-07-31: qty 1

## 2019-07-31 MED ORDER — IOHEXOL 350 MG/ML SOLN
75.0000 mL | Freq: Once | INTRAVENOUS | Status: AC | PRN
  Administered 2019-07-31: 02:00:00 75 mL via INTRAVENOUS

## 2019-07-31 MED ORDER — ACETAMINOPHEN 500 MG PO TABS: 500.0000 mg | ORAL_TABLET | Freq: Four times a day (QID) | ORAL | Status: DC | PRN

## 2019-07-31 MED ORDER — AMLODIPINE BESYLATE 5 MG PO TABS
5.0000 mg | ORAL_TABLET | Freq: Every day | ORAL | Status: DC
  Administered 2019-07-31 – 2019-08-01 (×2): 5 mg via ORAL
  Filled 2019-07-31 (×2): qty 1

## 2019-07-31 MED ORDER — BISACODYL 10 MG RE SUPP: 10.0000 mg | Freq: Every day | RECTAL | Status: DC | PRN

## 2019-07-31 MED ORDER — DONEPEZIL HCL 5 MG PO TABS
5.0000 mg | ORAL_TABLET | Freq: Every day | ORAL | Status: DC
  Administered 2019-07-31: 21:00:00 5 mg via ORAL
  Filled 2019-07-31: qty 1

## 2019-07-31 MED ORDER — HYDROCODONE-ACETAMINOPHEN 10-325 MG PO TABS
1.0000 | ORAL_TABLET | Freq: Three times a day (TID) | ORAL | Status: DC
  Administered 2019-07-31 – 2019-08-01 (×5): 1 via ORAL
  Filled 2019-07-31 (×5): qty 1

## 2019-07-31 MED ORDER — PRAVASTATIN SODIUM 10 MG PO TABS: 20.0000 mg | ORAL_TABLET | Freq: Every day | ORAL | Status: DC

## 2019-07-31 MED ORDER — QUETIAPINE FUMARATE 50 MG PO TABS
100.0000 mg | ORAL_TABLET | Freq: Every day | ORAL | Status: DC
  Administered 2019-07-31: 21:00:00 100 mg via ORAL
  Filled 2019-07-31: qty 2

## 2019-07-31 NOTE — Progress Notes (Signed)
  Echocardiogram 2D Echocardiogram has been performed.  Joy Ward 07/31/2019, 8:55 AM

## 2019-07-31 NOTE — Progress Notes (Signed)
EEG complete - results pending 

## 2019-07-31 NOTE — Progress Notes (Signed)
Carotid artery duplex completed. Refer to "CV Proc" under chart review to view preliminary results.  07/31/2019 10:56 AM Kelby Aline., MHA, RVT, RDCS, RDMS

## 2019-07-31 NOTE — ED Notes (Signed)
Off Floor to CT 

## 2019-07-31 NOTE — Progress Notes (Signed)
PROGRESS NOTE    Hazzel Schippers  [id number] DOB: Jul 31, 1940 DOA: 07/30/2019 PCP: Gayland Curry, DO    Brief Narrative:  Karia Iseman is a 79 y.o. female with medical history significant of dementia, CVA, type 2 diabetes, hypertension, hyperlipidemia, hypothyroidism presenting to the ED from her nursing home for evaluation of syncope. Patient lives at a memory care unit of her nursing home. She remembers she was in her room and then passed out.  She does not recall whether she was standing or what exactly she was doing at that time.  Denies chest pain, shortness of breath, cough, nausea, vomiting, abdominal pain, diarrhea, or dysuria.  ED Course: Blood pressure elevated with systolic in the [id number], remainder of vital signs stable.  Labs showing WBC count 2.0, previously normal.   Differential suggestive of neutropenia and mild lymphopenia.  Hemoglobin 8.7, previously in the 11-12 range.  FOBT reportedly negative.  Platelet count 64, previously normal.  Chloride 120, bicarb 15, calcium 5.6.  Lab specimen was thought to be possibly contaminated with normal saline and repeat labs were ordered but currently pending. Head CT negative for acute intracranial abnormality. Chest x-ray negative for acute finding. X-rays of bilateral knees showing no fracture or dislocation. Patient received a 500 cc normal saline bolus.  07/31/2019: Per the patient's daughter patient was noted to have rectal bleeding yesterday.  No report charted per the nurse.  Daughter also states that she had similar issues when she was admitted in February 2021 at which time she was diagnosed and treated for UTI but daughter is concerned that the abdominal issues was not addressed.  Patient also has history of constipation.  Assessment & Plan:   Principal Problem:   Syncope Active Problems:   Pancytopenia (HCC)   Hyperchloremia   Metabolic acidosis   Hypocalcemia   Lower GI bleed  Syncope: Exact etiology unclear at this time.   Orthostatic vitals were negative, head CT negative for acute intracranial abnormality. Differentials include vasovagal, arrhythmia/structural heart disease. EKG showing first-degree AV block.  CTA was negative for PE.  Echocardiogram and carotid Dopplers did not show any acute findings.  EEG suggestive of mild diffuse encephalopathy, nonspecific etiology.  No seizures noted.  GI Bleed: Daughter says she was at the patient's bedside last night and noticed bright red blood per rectum.  Also has severe constipation per the daughter.  Per the patient's daughter this has been ongoing issue.  Patient last admitted here in February 2021 at which time she was treated for UTI.  Abdominal issues not addressed per the daughter.  Nursing did not report any GI bleed. Will order stool occult blood.  Order hemoglobin for a.m.  Order CT of the abdomen and pelvis without contrast.  Reluctant to give contrast since she just got contrast today for CTA.  Pancytopenia: Lab abnormalities at the time of admission showed pancytopenia.  Repeat labs unremarkable.  Hyperchloremia, metabolic acidosis, hypocalcemia: Lab abnormalities appear new compared to labs done a week ago. Lab specimen was thought to be possibly contaminated with normal saline. Repeat BMP improved.  Noninsulin-dependent type 2 diabetes: Hb A1c 6.7. Continue sliding scale insulin sensitive ACHS and CBG checks.  Hold home Metformin.  Hypertension: Orthostatic vitals negative.  Has been started on Norvasc.  Continue to monitor blood pressure closely.  DVT prophylaxis: SCD Code Status: Full Family Communication: Discussed with daughter [name] via phone Disposition Plan:  . Patient came from: Assisted living            .  Anticipated d/c place: Once acute issues resolve  Consultants:   None  Procedures:   None  Antimicrobials:   None   Subjective: Patient denies having any complaints.  However, she has dementia and is a poor historian.   According to the patient's daughter patient had rectal bleeding yesterday.  Objective: Vitals:   07/31/19 0505 07/31/19 0731 07/31/19 0830 07/31/19 1134  BP: (!) 147/93 (!) 154/77 (!) 151/86   Pulse: 93 100 86 78  Resp: 19 20 16 15   Temp: 98.4 F (36.9 C) 98.3 F (36.8 C)    TempSrc: Oral Oral    SpO2: 100% 99% 93% 99%  Weight:      Height:        Intake/Output Summary (Last 24 hours) at 07/31/2019 1444 Last data filed at 07/31/2019 1019 Gross per 24 hour  Intake 123 ml  Output --  Net 123 ml   Filed Weights   07/30/19 1703  Weight: 68 kg    Examination:  General exam: Has history of dementia, awake and oriented but agitated at times Respiratory system: Clear to auscultation. Respiratory effort normal. Cardiovascular system: S1 & S2 heard, RRR. No murmurs. No pedal edema. Gastrointestinal system: Abdomen is nondistended, soft and nontender. No organomegaly or masses felt. Normal bowel sounds heard. Central nervous system: Awake, oriented x2. No focal neurological deficits. Extremities: No edema. Skin: No rashes, lesions or ulcers Psychiatry: She has dementia, agitated at times    Data Reviewed: I have personally reviewed following labs and imaging studies  CBC: Recent Labs  Lab 07/30/19 1854 07/31/19 0010  WBC 2.0* 5.5  NEUTROABS 1.2*  --   HGB 8.7* 12.2  HCT 29.1* 37.9  MCV 100.0 90.7  PLT 64* 0000000   Basic Metabolic Panel: Recent Labs  Lab 07/30/19 1854 07/31/19 0010  NA 143 140  K 4.9 3.3*  CL 120* 105  CO2 15* 24  GLUCOSE 88 195*  BUN 5* 7*  CREATININE 0.46 0.78  CALCIUM 5.6* 9.3   GFR: Estimated Creatinine Clearance: 53 mL/min (by C-G formula based on SCr of 0.78 mg/dL). Liver Function Tests: Recent Labs  Lab 07/31/19 0010  AST 25  ALT 21  ALKPHOS 59  BILITOT 0.7  PROT 6.3*  ALBUMIN 3.4*   No results for input(s): LIPASE, AMYLASE in the last 168 hours. No results for input(s): AMMONIA in the last 168 hours. Coagulation Profile: No  results for input(s): INR, PROTIME in the last 168 hours. Cardiac Enzymes: No results for input(s): CKTOTAL, CKMB, CKMBINDEX, TROPONINI in the last 168 hours. BNP (last 3 results) No results for input(s): PROBNP in the last 8760 hours. HbA1C: Recent Labs    07/30/19 2106  HGBA1C 6.7*   CBG: Recent Labs  Lab 07/30/19 1823 07/30/19 2232 07/31/19 0739 07/31/19 1139  GLUCAP 116* 162* 182* 152*   Lipid Profile: No results for input(s): CHOL, HDL, LDLCALC, TRIG, CHOLHDL, LDLDIRECT in the last 72 hours. Thyroid Function Tests: Recent Labs    07/30/19 2215 07/31/19 0909  TSH 39.015*  --   FREET4  --  0.42*   Anemia Panel: No results for input(s): VITAMINB12, FOLATE, FERRITIN, TIBC, IRON, RETICCTPCT in the last 72 hours. Sepsis Labs: No results for input(s): PROCALCITON, LATICACIDVEN in the last 168 hours.  Recent Results (from the past 240 hour(s))  Urine culture     Status: Abnormal   Collection Time: 07/23/19  7:25 AM   Specimen: Urine, Random  Result Value Ref Range Status   Specimen Description  Final    URINE, RANDOM Performed at [facility], [place] [address], [place], [place] [address]    Special Requests   Final    NONE Performed at [facility], [place] [address], [place], [place] [address]    Culture >=100,000 COLONIES/mL ESCHERICHIA COLI (A)  Final   Report Status 07/25/2019 FINAL  Final   Organism ID, Bacteria ESCHERICHIA COLI (A)  Final      Susceptibility   Escherichia coli - MIC*    AMPICILLIN 4 SENSITIVE Sensitive     CEFAZOLIN <=4 SENSITIVE Sensitive     CEFTRIAXONE <=0.25 SENSITIVE Sensitive     CIPROFLOXACIN <=0.25 SENSITIVE Sensitive     GENTAMICIN <=1 SENSITIVE Sensitive     IMIPENEM <=0.25 SENSITIVE Sensitive     NITROFURANTOIN <=16 SENSITIVE Sensitive     TRIMETH/SULFA <=20 SENSITIVE Sensitive     AMPICILLIN/SULBACTAM <=2 SENSITIVE Sensitive     PIP/TAZO <=4 SENSITIVE Sensitive     * >=100,000 COLONIES/mL  ESCHERICHIA COLI  Respiratory Panel by RT PCR (Flu A&B, Covid) - Nasopharyngeal Swab     Status: None   Collection Time: 07/31/19  2:37 AM   Specimen: Nasopharyngeal Swab  Result Value Ref Range Status   SARS Coronavirus 2 by RT PCR NEGATIVE NEGATIVE Final    Comment: (NOTE) SARS-CoV-2 target nucleic acids are NOT DETECTED. The SARS-CoV-2 RNA is generally detectable in upper respiratoy specimens during the acute phase of infection. The lowest concentration of SARS-CoV-2 viral copies this assay can detect is 131 copies/mL. A negative result does not preclude SARS-Cov-2 infection and should not be used as the sole basis for treatment or other patient management decisions. A negative result may occur with  improper specimen collection/handling, submission of specimen other than nasopharyngeal swab, presence of viral mutation(s) within the areas targeted by this assay, and inadequate number of viral copies (<131 copies/mL). A negative result must be combined with clinical observations, patient history, and epidemiological information. The expected result is Negative. Fact Sheet for Patients:  PinkCheek.be Fact Sheet for Healthcare Providers:  GravelBags.it This test is not yet ap proved or cleared by the [place] FDA and  has been authorized for detection and/or diagnosis of SARS-CoV-2 by FDA under an Emergency Use Authorization (EUA). This EUA will remain  in effect (meaning this test can be used) for the duration of the COVID-19 declaration under Section 564(b)(1) of the Act, 21 U.S.C. section 360bbb-3(b)(1), unless the authorization is terminated or revoked sooner.    Influenza A by PCR NEGATIVE NEGATIVE Final   Influenza B by PCR NEGATIVE NEGATIVE Final    Comment: (NOTE) The Xpert Xpress SARS-CoV-2/FLU/RSV assay is intended as an aid in  the diagnosis of influenza from Nasopharyngeal swab specimens and  should not be  used as a sole basis for treatment. Nasal washings and  aspirates are unacceptable for Xpert Xpress SARS-CoV-2/FLU/RSV  testing. Fact Sheet for Patients: PinkCheek.be Fact Sheet for Healthcare Providers: GravelBags.it This test is not yet approved or cleared by the [place] FDA and  has been authorized for detection and/or diagnosis of SARS-CoV-2 by  FDA under an Emergency Use Authorization (EUA). This EUA will remain  in effect (meaning this test can be used) for the duration of the  Covid-19 declaration under Section 564(b)(1) of the Act, 21  U.S.C. section 360bbb-3(b)(1), unless the authorization is  terminated or revoked. Performed at [facility], [place] [address] [place], [place] [address]  Radiology Studies: DG Knee 2 Views Right  Result Date: 07/30/2019 CLINICAL DATA:  Syncope, knee pain EXAM: RIGHT KNEE - 1-2 VIEW; LEFT KNEE - COMPLETE 4+ VIEW COMPARISON:  None. FINDINGS: No fracture or dislocation of the bilateral knees. Moderate arthrosis of the patellofemoral compartments. No knee joint effusion. Soft tissue edema anteriorly. IMPRESSION: No fracture or dislocation of the bilateral knees. Electronically Signed   By: Eddie Candle M.D.   On: 07/30/2019 18:15   CT Head Wo Contrast  Result Date: 07/30/2019 CLINICAL DATA:  Syncopal episode. Head trauma, minor (Age >= 65y) EXAM: CT HEAD WITHOUT CONTRAST TECHNIQUE: Contiguous axial images were obtained from the base of the skull through the vertex without intravenous contrast. COMPARISON:  Brain MRI 1 week ago 07/23/2019, and CT 05/01/2019 FINDINGS: Brain: Stable degree of atrophy. Stable advanced chronic small vessel ischemia. No intracranial hemorrhage, mass effect, or midline shift. No hydrocephalus. The basilar cisterns are patent. Remote left cerebellar infarct with is better defined on recent MRI. No evidence of territorial infarct or acute ischemia. No  extra-axial or intracranial fluid collection. Vascular: Atherosclerosis of skullbase vasculature without hyperdense vessel or abnormal calcification. Skull: No fracture or focal lesion. Sinuses/Orbits: Mucous retention cyst left maxillary sinus. The mastoid air cells are clear. The visualized orbits are unremarkable. Other: None. IMPRESSION: 1. No acute intracranial abnormality. 2. Stable atrophy and advanced chronic small vessel ischemia. Remote left cerebellar infarct was better delineated on recent MRI. Electronically Signed   By: Keith Rake M.D.   On: 07/30/2019 17:44   CT ANGIO CHEST PE W OR WO CONTRAST  Result Date: 07/31/2019 CLINICAL DATA:  Syncopal episode. EXAM: CT ANGIOGRAPHY CHEST WITH CONTRAST TECHNIQUE: Multidetector CT imaging of the chest was performed using the standard protocol during bolus administration of intravenous contrast. Multiplanar CT image reconstructions and MIPs were obtained to evaluate the vascular anatomy. CONTRAST:  5mL OMNIPAQUE IOHEXOL 350 MG/ML SOLN COMPARISON:  August 22, 2009 FINDINGS: Cardiovascular: There is mild calcification of the aortic arch. Satisfactory opacification of the pulmonary arteries to the segmental level. No evidence of pulmonary embolism. Normal heart size. No pericardial effusion. Moderate severity coronary artery calcification is noted. Mediastinum/Nodes: No enlarged mediastinal, hilar, or axillary lymph nodes. Thyroid gland, trachea, and esophagus demonstrate no significant findings. Lungs/Pleura: A stable 4 mm noncalcified lung nodule is seen within the posterior aspect of the right apex. Additional predominant stable 6 mm noncalcified lung nodule is seen within the inferior aspect of the right upper lobe. Upper Abdomen: There is a small hiatal hernia. Musculoskeletal: Multilevel degenerative changes seen throughout the thoracic spine. Review of the MIP images confirms the above findings. IMPRESSION: 1. No CT evidence of pulmonary embolism. 2.  Moderate severity coronary artery calcification. 3. Small hiatal hernia. 4. Two stable noncalcified lung nodules within the right upper lobe, the largest measuring 6 mm. No follow-up needed if patient is low-risk (and has no known or suspected primary neoplasm). Non-contrast chest CT can be considered in 12 months if patient is high-risk. This recommendation follows the consensus statement: Guidelines for Management of Incidental Pulmonary Nodules Detected on CT Images:From the Fleischner Society 2017; published online before print ( ID Nonie Hoyer. ID ). Aortic Atherosclerosis (ICD10-I70.0). Electronically Signed   By: Virgina Norfolk M.D.   On: 07/31/2019 02:04   DG Chest Port 1 View  Result Date: 07/30/2019 CLINICAL DATA:  Syncope, knee pain EXAM: PORTABLE CHEST 1 VIEW COMPARISON:  06/24/2019 FINDINGS: The heart size and mediastinal contours are within normal limits. Both lungs are  clear. The visualized skeletal structures are unremarkable. IMPRESSION: Normal study. Electronically Signed   By: Rolm Baptise M.D.   On: 07/30/2019 18:15   DG Knee Complete 4 Views Left  Result Date: 07/30/2019 CLINICAL DATA:  Syncope, knee pain EXAM: RIGHT KNEE - 1-2 VIEW; LEFT KNEE - COMPLETE 4+ VIEW COMPARISON:  None. FINDINGS: No fracture or dislocation of the bilateral knees. Moderate arthrosis of the patellofemoral compartments. No knee joint effusion. Soft tissue edema anteriorly. IMPRESSION: No fracture or dislocation of the bilateral knees. Electronically Signed   By: Eddie Candle M.D.   On: 07/30/2019 18:15   EEG adult  Result Date: 07/31/2019 Lora Havens, MD     07/31/2019 11:08 AM Patient Name: Denny Mcdowall MRN: [id number] Epilepsy Attending: Lora Havens Referring Physician/Provider: Dr. Shela Leff Date: 07/31/2019 Duration: 25.16 minutes Patient history: 79 year old female who presented with syncope.  EEG evaluate for seizures. Level of alertness: Awake AEDs during EEG study: None Technical aspects:  This EEG study was done with scalp electrodes positioned according to the 10-20 International system of electrode placement. Electrical activity was acquired at a sampling rate of 500Hz  and reviewed with a high frequency filter of 70Hz  and a low frequency filter of 1Hz . EEG data were recorded continuously and digitally stored. Description: The posterior dominant rhythm consists of 8 Hz activity of moderate voltage (25-35 uV) seen predominantly in posterior head regions, symmetric and reactive to eye opening and eye closing.  Intermittent generalized 2 to 3 Hz delta slowing was also noted.  Photic driving was not seen during photic stimulation.  Hyperventilation was not performed. Abnormality -Intermittent slow, generalized IMPRESSION: This study is suggestive of mild diffuse encephalopathy, nonspecific etiology.  No seizures or epileptiform discharges were seen throughout the recording. Lora Havens   ECHOCARDIOGRAM COMPLETE  Result Date: 07/31/2019    ECHOCARDIOGRAM REPORT   Patient Name:   MARRIANA SHETLEY Date of Exam: 07/31/2019 Medical Rec #:  [id number]      Height:       65.0 in Accession #:    [id number]     Weight:       149.9 lb Date of Birth:  July 24, 1940      BSA:          1.750 m Patient Age:    17 years       BP:           154/77 mmHg Patient Gender: F              HR:           100 bpm. Exam Location:  Inpatient Procedure: 2D Echo, Cardiac Doppler and Color Doppler Indications:    Syncope  History:        Patient has prior history of Echocardiogram examinations, most                 recent 04/23/2016. Stroke; Risk Factors:Diabetes, Hypertension                 and Dyslipidemia. Dementia.  Sonographer:    Dustin Flock Referring Phys: [id number] [place]  1. Left ventricular ejection fraction, by estimation, is 65 to 70%. The left ventricle has normal function. The left ventricle has no regional wall motion abnormalities. There is mild concentric left ventricular hypertrophy.  Indeterminate diastolic filling due to E-A fusion.  2. Right ventricular systolic function is normal. The right ventricular size is normal. There is normal pulmonary artery systolic pressure. The estimated right ventricular  systolic pressure is 123456 mmHg.  3. The mitral valve is degenerative. Trivial mitral valve regurgitation. No evidence of mitral stenosis.  4. The aortic valve is tricuspid. Aortic valve regurgitation is not visualized. Mild aortic valve stenosis.  5. The inferior vena cava is normal in size with greater than 50% respiratory variability, suggesting right atrial pressure of 3 mmHg. Comparison(s): A prior study was performed on 04/23/2016. No significant change from prior study. Prior images reviewed side by side. FINDINGS  Left Ventricle: Left ventricular ejection fraction, by estimation, is 65 to 70%. The left ventricle has normal function. The left ventricle has no regional wall motion abnormalities. The left ventricular internal cavity size was normal in size. There is  mild concentric left ventricular hypertrophy. Indeterminate diastolic filling due to E-A fusion. Right Ventricle: The right ventricular size is normal. No increase in right ventricular wall thickness. Right ventricular systolic function is normal. There is normal pulmonary artery systolic pressure. The tricuspid regurgitant velocity is 2.03 m/s, and  with an assumed right atrial pressure of 3 mmHg, the estimated right ventricular systolic pressure is 123456 mmHg. Left Atrium: Left atrial size was normal in size. Right Atrium: Right atrial size was normal in size. Pericardium: There is no evidence of pericardial effusion. Presence of pericardial fat pad. Mitral Valve: The mitral valve is degenerative in appearance. Mild mitral annular calcification. Trivial mitral valve regurgitation. No evidence of mitral valve stenosis. Tricuspid Valve: The tricuspid valve is grossly normal. Tricuspid valve regurgitation is not demonstrated. No  evidence of tricuspid stenosis. Aortic Valve: The aortic valve is tricuspid. . There is mild thickening and mild calcification of the aortic valve. Aortic valve regurgitation is not visualized. Mild aortic stenosis is present. There is mild thickening of the aortic valve. There is mild  calcification of the aortic valve. Aortic valve mean gradient measures 5.5 mmHg. Aortic valve peak gradient measures 9.5 mmHg. Aortic valve area, by VTI measures 1.56 cm. Pulmonic Valve: The pulmonic valve was grossly normal. Pulmonic valve regurgitation is not visualized. No evidence of pulmonic stenosis. Aorta: The aortic root is normal in size and structure. Venous: The inferior vena cava is normal in size with greater than 50% respiratory variability, suggesting right atrial pressure of 3 mmHg. IAS/Shunts: The interatrial septum appears to be lipomatous. The atrial septum is grossly normal.  LEFT VENTRICLE PLAX 2D LVIDd:         3.20 cm  Diastology LVIDs:         2.40 cm  LV e' lateral:   8.38 cm/s LV PW:         1.20 cm  LV E/e' lateral: 7.4 LV IVS:        1.30 cm  LV e' medial:    6.31 cm/s LVOT diam:     1.90 cm  LV E/e' medial:  9.8 LV SV:         49 LV SV Index:   28 LVOT Area:     2.84 cm  RIGHT VENTRICLE RV Basal diam:  2.60 cm RV S prime:     9.36 cm/s TAPSE (M-mode): 2.7 cm LEFT ATRIUM             Index       RIGHT ATRIUM           Index LA diam:        3.50 cm 2.00 cm/m  RA Area:     12.40 cm LA Vol (A2C):   33.9 ml 19.37 ml/m RA Volume:  27.80 ml  15.89 ml/m LA Vol (A4C):   51.2 ml 29.26 ml/m LA Biplane Vol: 43.3 ml 24.74 ml/m  AORTIC VALVE AV Area (Vmax):    1.53 cm AV Area (Vmean):   1.37 cm AV Area (VTI):     1.56 cm AV Vmax:           154.17 cm/s AV Vmean:          113.328 cm/s AV VTI:            0.314 m AV Peak Grad:      9.5 mmHg AV Mean Grad:      5.5 mmHg LVOT Vmax:         83.30 cm/s LVOT Vmean:        54.900 cm/s LVOT VTI:          0.173 m LVOT/AV VTI ratio: 0.55  AORTA Ao Root diam: 2.70 cm  MITRAL VALVE                TRICUSPID VALVE MV Area (PHT): 4.36 cm     TR Peak grad:   16.5 mmHg MV Decel Time: 174 msec     TR Vmax:        203.00 cm/s MV E velocity: 61.80 cm/s MV A velocity: 122.00 cm/s  SHUNTS MV E/A ratio:  0.51         Systemic VTI:  0.17 m                             Systemic Diam: 1.90 cm Eleonore Chiquito MD Electronically signed by Eleonore Chiquito MD Signature Date/Time: 07/31/2019/2:13:35 PM    Final    VAS US CAROTID  Result Date: 07/31/2019 Carotid Arterial Duplex Study Indications: Syncope. Performing Technologist: Maudry Mayhew MHA, RDMS, RVT, RDCS  Examination Guidelines: A complete evaluation includes B-mode imaging, spectral Doppler, color Doppler, and power Doppler as needed of all accessible portions of each vessel. Bilateral testing is considered an integral part of a complete examination. Limited examinations for reoccurring indications may be performed as noted.  Right Carotid Findings: +----------+--------+--------+--------+-----------------------+--------+           PSV cm/sEDV cm/sStenosisPlaque Description     Comments +----------+--------+--------+--------+-----------------------+--------+ CCA Prox  78      14                                              +----------+--------+--------+--------+-----------------------+--------+ CCA Distal62      16              smooth and heterogenous         +----------+--------+--------+--------+-----------------------+--------+ ICA Prox  39      15              smooth and heterogenous         +----------+--------+--------+--------+-----------------------+--------+ ICA Distal71      25                                              +----------+--------+--------+--------+-----------------------+--------+ ECA       58      9                                               +----------+--------+--------+--------+-----------------------+--------+  +----------+--------+-------+----------------+-------------------+  PSV cm/sEDV cmsDescribe        Arm Pressure (mmHG) +----------+--------+-------+----------------+-------------------+ [id number]             Multiphasic, WNL                    +----------+--------+-------+----------------+-------------------+ +---------+--------+--+--------+--+---------+ VertebralPSV cm/s73EDV cm/s21Antegrade +---------+--------+--+--------+--+---------+  Left Carotid Findings: +----------+--------+--------+--------+-----------------------+--------+           PSV cm/sEDV cm/sStenosisPlaque Description     Comments +----------+--------+--------+--------+-----------------------+--------+ CCA Prox  90      19                                              +----------+--------+--------+--------+-----------------------+--------+ CCA Distal74      14              smooth and heterogenous         +----------+--------+--------+--------+-----------------------+--------+ ICA Prox  71      15              smooth and heterogenous         +----------+--------+--------+--------+-----------------------+--------+ ICA Distal41      16                                              +----------+--------+--------+--------+-----------------------+--------+ ECA       58      7                                               +----------+--------+--------+--------+-----------------------+--------+ +----------+--------+--------+----------------+-------------------+           PSV cm/sEDV cm/sDescribe        Arm Pressure (mmHG) +----------+--------+--------+----------------+-------------------+ [id number]             Multiphasic, WNL                    +----------+--------+--------+----------------+-------------------+ +---------+--------+--+--------+--+---------+ VertebralPSV cm/s52EDV cm/s15Antegrade +---------+--------+--+--------+--+---------+   Summary: Right Carotid:  Velocities in the right ICA are consistent with a 1-39% stenosis. Left Carotid: Velocities in the left ICA are consistent with a 1-39% stenosis. Vertebrals:  Bilateral vertebral arteries demonstrate antegrade flow. Subclavians: Normal flow hemodynamics were seen in bilateral subclavian              arteries. *See table(s) above for measurements and observations.  Electronically signed by Antony Contras MD on 07/31/2019 at 12:31:19 PM.    Final         Scheduled Meds: . amLODipine  5 mg Oral Daily  . donepezil  5 mg Oral QHS  . HYDROcodone-acetaminophen  1 tablet Oral TID  . insulin aspart  0-5 Units Subcutaneous QHS  . insulin aspart  0-9 Units Subcutaneous TID WC  . [START ON 08/01/2019] levothyroxine  75 mcg Oral QAC breakfast  . potassium chloride  20 mEq Oral Once  . QUEtiapine  100 mg Oral QHS  . sodium chloride flush  3 mL Intravenous Q12H   Continuous Infusions:   LOS: 0 days     Yaakov Guthrie, MD [facility]   To contact the attending provider between 7A-7P or the covering provider during after hours 7P-7A, please log into the web site [email or web address]  and access using universal [place] password for that web site. If you do not have the password, please call the hospital operator.  07/31/2019, 2:44 PM

## 2019-07-31 NOTE — Care Management Obs Status (Signed)
[place] NOTIFICATION   Patient Details  Name: Joy Ward MRN: [id number] Date of Birth: [birth year]   Medicare Observation Status Notification Given:  Yes    Bethena Roys, RN 07/31/2019, 2:14 PM

## 2019-07-31 NOTE — TOC Initial Note (Signed)
Transition of Care [facility]) - Initial/Assessment Note    Patient Details  Name: Joy Ward MRN: [id number] Date of Birth: July 14, 1940  [facility]) CM/SW Contact:    Trula Ore, [place] Phone Number: 07/31/2019, 1:58 PM  Clinical Narrative:           CSW spoke with patients daughter [name]. CSW confirmed with patients daughter that patient is from [facility]. Patients daughter [name] confirmed with CSW that patient will be going back to [facility] [place].  CSW will continue to follow.          Expected Discharge Plan: Assisted Living Barriers to Discharge: Continued Medical Work up   Patient Goals and CMS Choice   CMS Medicare.gov Compare Post Acute Care list provided to:: Patient Represenative (must comment)(Daughter [name]) Choice offered to / list presented to : Adult Children  Expected Discharge Plan and Services Expected Discharge Plan: Assisted Living       Living arrangements for the past 2 months: [place]                                      Prior Living Arrangements/Services Living arrangements for the past 2 months: [place] Lives with:: Facility Resident Patient language and need for interpreter reviewed:: Yes Do you feel safe going back to the place where you live?: Yes      Need for Family Participation in Patient Care: Yes (Comment) Care giver support system in place?: Yes (comment)   Criminal Activity/Legal Involvement Pertinent to Current Situation/Hospitalization: No - Comment as needed  Activities of Daily Living      Permission Sought/Granted Permission sought to share information with : Case Manager, Family Supports, Chartered certified accountant granted to share information with : Yes, Verbal Permission Granted  Share Information with NAME: [name]  Permission granted to share info w AGENCY: [place] granted to share info w  Relationship: Daughter  Permission granted to share info w Contact Information: [name] [phone]  Emotional Assessment       Orientation: : Oriented to Self, Oriented to Place Alcohol / Substance Use: Not Applicable Psych Involvement: No (comment)  Admission diagnosis:  Hypocalcemia [E83.51] Syncope and collapse [R55] Syncope [R55] Pancytopenia ([place]) [id number] Patient Active Problem List   Diagnosis Date Noted  . Syncope 07/30/2019  . Pancytopenia ([place]) 07/30/2019  . Hyperchloremia 07/30/2019  . Metabolic acidosis [id number]  . Hypocalcemia 07/30/2019  . Palliative care patient 07/03/2019  . Hallucinations 06/24/2019  . Metabolic encephalopathy [id number]  . Altered mental status   . UTI (urinary tract infection) 04/30/2019  . Dementia without behavioral disturbance ([place])   . Vascular dementia with behavior disturbance ([place]) 04/14/2019  . Primary insomnia 04/14/2019  . Hypothyroidism 04/14/2019  . Diabetes mellitus ([place]) 04/14/2019  . Memory loss or impairment 03/29/2019  . Diabetic peripheral neuropathy associated with type 2 diabetes mellitus ([place]) 12/29/2017  . Peripheral edema 12/29/2017  . Elevated liver enzymes 10/15/2016  . Chest pain 04/22/2016  . Systolic murmur [id number]  . Hypercalcemia 04/22/2016  . Hyperlipidemia 04/22/2016  . Baker's cyst of knee, right 04/21/2016  . HYPOKALEMIA 01/24/2010  . CONSTIPATION 12/23/2009  . INTERMITTENT VERTIGO 12/23/2009  . SINUSITIS, ACUTE 04/25/2009  . VAGINITIS, CANDIDAL 02/14/2009  . HIP PAIN, RIGHT 02/14/2009  . HYPERLIPIDEMIA, MIXED 01/01/2009  . BACK PAIN, LUMBAR, WITH RADICULOPATHY 12/25/2008  . KNEE PAIN, BILATERAL  12/18/2008  . TROCHANTERIC BURSITIS, BILATERAL 12/18/2008  . Disturbance in sleep behavior 12/18/2008  . ONYCHOMYCOSIS, TOENAILS 10/30/2008  . ENTERITIS, CLOSTRIDIUM DIFFICILE 10/15/2006  . Diabetes type 2, uncontrolled ([place]) 09/18/2006  . Essential hypertension 09/18/2006  . Allergic rhinitis  09/18/2006  . GOITER, MULTINODULAR 06/29/2006  . STROKE 04/27/2001   PCP:  Gayland Curry, DO Pharmacy:  No Pharmacies Listed    Social Determinants of Health (SDOH) Interventions    Readmission Risk Interventions No flowsheet data found.

## 2019-07-31 NOTE — Procedures (Signed)
Patient Name: Joy Ward  MRN: [id number]  Epilepsy Attending: Lora Havens  Referring Physician/Provider: Dr. Shela Leff Date: 07/31/2019 Duration: 25.16 minutes  Patient history: 79 year old female who presented with syncope.  EEG evaluate for seizures.  Level of alertness: Awake  AEDs during EEG study: None  Technical aspects: This EEG study was done with scalp electrodes positioned according to the 10-20 International system of electrode placement. Electrical activity was acquired at a sampling rate of 500Hz  and reviewed with a high frequency filter of 70Hz  and a low frequency filter of 1Hz . EEG data were recorded continuously and digitally stored.   Description: The posterior dominant rhythm consists of 8 Hz activity of moderate voltage (25-35 uV) seen predominantly in posterior head regions, symmetric and reactive to eye opening and eye closing.  Intermittent generalized 2 to 3 Hz delta slowing was also noted.  Photic driving was not seen during photic stimulation.  Hyperventilation was not performed.  Abnormality -Intermittent slow, generalized  IMPRESSION: This study is suggestive of mild diffuse encephalopathy, nonspecific etiology.  No seizures or epileptiform discharges were seen throughout the recording.  Stephen Baruch Barbra Sarks

## 2019-07-31 NOTE — Progress Notes (Signed)
Patient c/o back pain, refusing tylenol at this time, requesting "her regular medications".  Dr. Barth Kirks notified.

## 2019-08-01 DIAGNOSIS — I119 Hypertensive heart disease without heart failure: Secondary | ICD-10-CM | POA: Diagnosis present

## 2019-08-01 DIAGNOSIS — Z8673 Personal history of transient ischemic attack (TIA), and cerebral infarction without residual deficits: Secondary | ICD-10-CM | POA: Diagnosis not present

## 2019-08-01 DIAGNOSIS — E890 Postprocedural hypothyroidism: Secondary | ICD-10-CM | POA: Diagnosis present

## 2019-08-01 DIAGNOSIS — Z882 Allergy status to sulfonamides status: Secondary | ICD-10-CM | POA: Diagnosis not present

## 2019-08-01 DIAGNOSIS — Z888 Allergy status to other drugs, medicaments and biological substances status: Secondary | ICD-10-CM | POA: Diagnosis not present

## 2019-08-01 DIAGNOSIS — E785 Hyperlipidemia, unspecified: Secondary | ICD-10-CM | POA: Diagnosis present

## 2019-08-01 DIAGNOSIS — Z91048 Other nonmedicinal substance allergy status: Secondary | ICD-10-CM | POA: Diagnosis not present

## 2019-08-01 DIAGNOSIS — K5900 Constipation, unspecified: Secondary | ICD-10-CM | POA: Diagnosis present

## 2019-08-01 DIAGNOSIS — Z20822 Contact with and (suspected) exposure to covid-19: Secondary | ICD-10-CM | POA: Diagnosis present

## 2019-08-01 DIAGNOSIS — Z8249 Family history of ischemic heart disease and other diseases of the circulatory system: Secondary | ICD-10-CM | POA: Diagnosis not present

## 2019-08-01 DIAGNOSIS — R000 Tachycardia, unspecified: Secondary | ICD-10-CM | POA: Diagnosis present

## 2019-08-01 DIAGNOSIS — E119 Type 2 diabetes mellitus without complications: Secondary | ICD-10-CM | POA: Diagnosis present

## 2019-08-01 DIAGNOSIS — Z87891 Personal history of nicotine dependence: Secondary | ICD-10-CM | POA: Diagnosis not present

## 2019-08-01 DIAGNOSIS — I440 Atrioventricular block, first degree: Secondary | ICD-10-CM | POA: Diagnosis present

## 2019-08-01 DIAGNOSIS — E878 Other disorders of electrolyte and fluid balance, not elsewhere classified: Secondary | ICD-10-CM | POA: Diagnosis present

## 2019-08-01 DIAGNOSIS — D61818 Other pancytopenia: Secondary | ICD-10-CM | POA: Diagnosis present

## 2019-08-01 DIAGNOSIS — Z811 Family history of alcohol abuse and dependence: Secondary | ICD-10-CM | POA: Diagnosis not present

## 2019-08-01 DIAGNOSIS — Z833 Family history of diabetes mellitus: Secondary | ICD-10-CM | POA: Diagnosis not present

## 2019-08-01 DIAGNOSIS — F0390 Unspecified dementia without behavioral disturbance: Secondary | ICD-10-CM | POA: Diagnosis present

## 2019-08-01 DIAGNOSIS — E872 Acidosis: Secondary | ICD-10-CM | POA: Diagnosis present

## 2019-08-01 DIAGNOSIS — R55 Syncope and collapse: Secondary | ICD-10-CM | POA: Diagnosis present

## 2019-08-01 DIAGNOSIS — Z87440 Personal history of urinary (tract) infections: Secondary | ICD-10-CM | POA: Diagnosis not present

## 2019-08-01 DIAGNOSIS — Z813 Family history of other psychoactive substance abuse and dependence: Secondary | ICD-10-CM | POA: Diagnosis not present

## 2019-08-01 DIAGNOSIS — K922 Gastrointestinal hemorrhage, unspecified: Secondary | ICD-10-CM | POA: Diagnosis not present

## 2019-08-01 LAB — BASIC METABOLIC PANEL
Anion gap: 12 (ref 5–15)
BUN: 9 mg/dL (ref 8–23)
CO2: 23 mmol/L (ref 22–32)
Calcium: 9.5 mg/dL (ref 8.9–10.3)
Chloride: 102 mmol/L (ref 98–111)
Creatinine, Ser: 0.6 mg/dL (ref 0.44–1.00)
GFR calc Af Amer: >60 mL/min (ref >60)
GFR calc non Af Amer: >60 mL/min (ref >60)
Glucose, Bld: 178 mg/dL — ABNORMAL HIGH (ref 70–99)
Potassium: 3.7 mmol/L (ref 3.5–5.1)
Sodium: 137 mmol/L (ref 135–145)

## 2019-08-01 LAB — CBC
HCT: 37.9 % (ref 36.0–46.0)
Hemoglobin: 12.2 g/dL (ref 12.0–15.0)
MCH: 29.5 pg (ref 26.0–34.0)
MCHC: 32.2 g/dL (ref 30.0–36.0)
MCV: 91.5 fL (ref 80.0–100.0)
Platelets: 178 10*3/uL (ref 150–400)
RBC: 4.14 MIL/uL (ref 3.87–5.11)
RDW: 14.1 % (ref 11.5–15.5)
WBC: 6.7 10*3/uL (ref 4.0–10.5)
nRBC: 0 % (ref 0.0–0.2)

## 2019-08-01 LAB — GLUCOSE, CAPILLARY
Glucose-Capillary: 141 mg/dL — ABNORMAL HIGH (ref 70–99)
Glucose-Capillary: 163 mg/dL — ABNORMAL HIGH (ref 70–99)
Glucose-Capillary: 153 mg/dL — ABNORMAL HIGH (ref 70–99)

## 2019-08-01 MED ORDER — MAGNESIUM HYDROXIDE 400 MG/5ML PO SUSP: 30.0000 mL | Freq: Every evening | ORAL | 0 refills | Status: DC | PRN

## 2019-08-01 MED ORDER — ALUM & MAG HYDROXIDE-SIMETH 200-200-20 MG/5ML PO SUSP: 30.0000 mL | Freq: Four times a day (QID) | ORAL | 0 refills | Status: DC | PRN

## 2019-08-01 MED ORDER — METFORMIN HCL ER 500 MG PO TB24: 500.0000 mg | ORAL_TABLET | Freq: Every day | ORAL | Status: DC

## 2019-08-01 MED ORDER — POLYETHYLENE GLYCOL 3350 17 G PO PACK: 17.0000 g | PACK | Freq: Every day | ORAL | 0 refills | Status: DC

## 2019-08-01 MED ORDER — METOPROLOL TARTRATE 25 MG PO TABS: 12.5000 mg | ORAL_TABLET | Freq: Two times a day (BID) | ORAL | 0 refills | Status: DC

## 2019-08-01 MED ORDER — LEVOTHYROXINE SODIUM 100 MCG PO TABS: 100.0000 ug | ORAL_TABLET | Freq: Every day | ORAL | 0 refills | Status: DC

## 2019-08-01 NOTE — Evaluation (Signed)
Physical Therapy Evaluation Patient Details Name: Joy Ward MRN: [id number] DOB: 07/04/1940 Today's Date: 08/01/2019   History of Present Illness  79 y.o. female with medical history significant of dementia, CVA, type 2 diabetes, hypertension, hyperlipidemia, hypothyroidism presenting to the ED from her nursing home for evaluation of syncope. Patient lives at a memory care unit of her nursing home. Pt hospitalized 02/21 with UTI, this admisson found to have syncope, pancytopenia, metabolic acidosis, hypocalcemia, lower GI bleed.   Clinical Impression   Pt admitted with above diagnosis. PTA states was living in assisted living and getting help with ADLs etc, she states she was ambulatory and that she used no AD for this. Pt currently with functional limitations due to the deficits listed below (see PT Problem List). This pm pt c/o pain in BLE (L>R) states its not so bad but declined attempting ambulation sec to pain. Was able to get to edge of bed with stand by assist and stand at edge of bed with stand by assist to min guard and no AD. Pt will benefit from skilled PT to increase their independence and safety with mobility to allow discharge to the venue listed below.       Follow Up Recommendations No PT follow up    Equipment Recommendations  None recommended by PT    Recommendations for Other Services       Precautions / Restrictions Precautions Precautions: Fall Restrictions Weight Bearing Restrictions: No      Mobility  Bed Mobility Overal bed mobility: Needs Assistance Bed Mobility: Supine to Sit;Sit to Supine     Supine to sit: Supervision Sit to supine: Supervision   General bed mobility comments: able to get to edge of bed with supervision and cues  Transfers Overall transfer level: Needs assistance Equipment used: None Transfers: Sit to/from Stand Sit to Stand: Min guard;Supervision         General transfer comment: able to get to stance at edge of  bed  Ambulation/Gait             General Gait Details: declined ambulation stating her legs hurt and she did not feel like ambulating   Stairs            Wheelchair Mobility    Modified Rankin (Stroke Patients Only)       Balance Overall balance assessment: Needs assistance Sitting-balance support: Feet supported Sitting balance-Leahy Scale: Fair     Standing balance support: During functional activity Standing balance-Leahy Scale: Fair Standing balance comment: no UE support and no AD                             Pertinent Vitals/Pain Pain Assessment: Faces Faces Pain Scale: Hurts even more Pain Location: BLE L>R Pain Descriptors / Indicators: Aching;Grimacing Pain Intervention(s): Limited activity within patient's tolerance;Monitored during session    Home Living Family/patient expects to be discharged to:: Assisted living               Home Equipment: None      Prior Function Level of Independence: Needs assistance   Gait / Transfers Assistance Needed: states was ambulating without walker  ADL's / Homemaking Assistance Needed: stated was assisted at home  Comments: not sure of accuracy of all hx info given  but pt knows she now lives at assisted living and has been there few months     Hand Dominance   Dominant Hand: Right    Extremity/Trunk Assessment  Upper Extremity Assessment Upper Extremity Assessment: Generalized weakness    Lower Extremity Assessment Lower Extremity Assessment: Generalized weakness    Cervical / Trunk Assessment Cervical / Trunk Assessment: Kyphotic  Communication   Communication: No difficulties  Cognition Arousal/Alertness: Awake/alert Behavior During Therapy: WFL for tasks assessed/performed Overall Cognitive Status: History of cognitive impairments - at baseline                                        General Comments      Exercises     Assessment/Plan    PT  Assessment Patient needs continued PT services  PT Problem List Decreased strength;Decreased activity tolerance;Decreased balance;Decreased mobility;Decreased coordination;Decreased cognition;Decreased knowledge of use of DME;Decreased safety awareness       PT Treatment Interventions DME instruction;Gait training;Functional mobility training;Therapeutic activities;Therapeutic exercise;Balance training;Neuromuscular re-education;Patient/family education    PT Goals (Current goals can be found in the Care Plan section)  Acute Rehab PT Goals Patient Stated Goal: no goals stated at this time PT Goal Formulation: Patient unable to participate in goal setting Time For Goal Achievement: 08/15/19 Potential to Achieve Goals: Fair    Frequency Min 2X/week   Barriers to discharge        Co-evaluation               AM-PAC PT "6 Clicks" Mobility  Outcome Measure Help needed turning from your back to your side while in a flat bed without using bedrails?: None Help needed moving from lying on your back to sitting on the side of a flat bed without using bedrails?: None Help needed moving to and from a bed to a chair (including a wheelchair)?: A Little Help needed standing up from a chair using your arms (e.g., wheelchair or bedside chair)?: A Little Help needed to walk in hospital room?: A Lot Help needed climbing 3-5 steps with a railing? : A Lot 6 Click Score: 18    End of Session   Activity Tolerance: Patient limited by pain;Patient limited by fatigue Patient left: in bed;with call bell/phone within reach;with bed alarm set Nurse Communication: Mobility status PT Visit Diagnosis: Other abnormalities of gait and mobility (R26.89);Muscle weakness (generalized) (M62.81)    Time: [id number] PT Time Calculation (min) (ACUTE ONLY): 12 min   Charges:   PT Evaluation $PT Eval Moderate Complexity: [place], PT   Delford Field 08/01/2019, 2:36 PM

## 2019-08-01 NOTE — Discharge Summary (Signed)
Physician Discharge Summary  Caralyn Becket [id number] DOB: [birth year] DOA: 07/30/2019  PCP: Gayland Curry, DO  Admit date: 07/30/2019 Discharge date: 08/01/2019  Admitted From:  ALF Disposition:  ALF  Recommendations for Outpatient Follow-up:  1. Follow up with PCP in 1-2 weeks 2. Please obtain BMP/CBC in one week   Home Health: No Equipment/Devices: No  Discharge Condition: Stable CODE STATUS: Full Diet recommendation: Heart Healthy / Carb Modified   Brief/Interim Summary: Jinna Zellousis a 79 y.o.femalewith medical history significant ofdementia, CVA, type 2 diabetes, hypertension, hyperlipidemia, hypothyroidism presented to the ED from her ALF for evaluation of syncope.Patient lives at a memory care unit of her nursing home. She remembers she was in her room and then passed out. She does not recall whether she was standing or what exactly she was doing at that time. No chest pain, shortness of breath, cough, nausea, vomiting, abdominal pain, diarrhea, or dysuria.  ED Course:Blood pressure elevated with systolic in the [id number], remainder of vital signs stable. Labs showing WBC count 2.0, previously normal. Differential suggestive of neutropenia and mild lymphopenia.Hemoglobin 8.7, previously in the 11-12 range. FOBT negative. Platelet count 64, previously normal. Chloride 120, bicarb 15, calcium 5.6. Lab specimen was thought to be possibly contaminated with normal saline and repeat labs were ordered which came back normal. Head CT negative for acute intracranial abnormality. Chest x-ray negative for acute finding. X-raysof bilateral knees showing no fracture or dislocation.   07/31/2019: Per the patient's daughter, patient was noted to have rectal bleeding yesterday. No report charted per the nurse.  Daughter also states that she had similar issues when she was admitted in February 2021 at which time she was diagnosed and treated for UTI but daughter is concerned that the  abdominal issues was not addressed. Patient also has history of constipation.  Following the hospital course with the problem list:  Syncope: Exact etiology unclear at this time.  Orthostatic vitals were negative, head CT negative for acute intracranial abnormality. Differentials include vasovagal, arrhythmia/structural heart disease. EKG showing first-degree AV block.   However, she has been tachycardic. CTA was negative for PE.  Echocardiogram and carotid Dopplers did not show any acute findings.  EEG suggestive of mild diffuse encephalopathy, nonspecific etiology.  No seizures noted.  This hospitalization UA was negative for UTI.  GI Bleed? Daughter says she was at the patient's bedside and noticed bright red blood per rectum.  Also has severe constipation per the daughter.  Per the patient's daughter this has been ongoing issue.  Patient last admitted here in 3/28 at which time she was treated for UTI.  Abdominal issues not addressed per the daughter.  Nursing did not report any GI bleed.  Stool for occult blood was negative.  Hemoglobin stable at 12.2. CT of the abdomen and pelvis without contrast did not show any acute findings.  Reluctant to give contrast since she recently got contrast for CTA.  Constipation: Daughter reports patient has severe constipation and is requesting to change the MiraLAX dose to daily which has been done.  She is also on as needed Dulcolax.  Follow-up with primary care physician outpatient.  Pancytopenia:Lab abnormalities at the time of admission showed pancytopenia. Lab specimen was thought to be possibly contaminated with normal saline and repeat labs were ordered which came back normal.  Repeat labs have been unremarkable.  Hyperchloremia, metabolic acidosis, hypocalcemia: Lab abnormalities likely as Lab specimen was thought to be possibly contaminated with normal saline. Repeat BMP normal.  Noninsulin-dependent type 2  diabetes:Hb A1c 6.7. Continuesliding  scale insulin sensitive ACHS and CBGchecks. Hold home Metformin.  Hypertension: Orthostatic vitals negative.  Patient's daughter concerned that the patient is not drinking enough water and high risk for dehydration.  Therefore stopped the diuretic and Cozaar.   Started on low-dose Norvasc due to elevated diastolic blood pressure.  She also has been tachycardic therefore started low-dose metoprolol.  Discussed with the patient daughter and advised to have her follow-up closely with her primary care doctor to have her blood pressure monitored in the outpatient setting and adjust medications as needed.  Hypothyroidism: TSH came back elevated at 39.015.  Free T4 0.42.  Increased the dose of Levoxyl.  Advised to follow-up outpatient with her primary care physician and have her TFTs checked in a couple of weeks and adjust accordingly.  Dementia: Continue home medications.  Follow-up outpatient.  Discharge Diagnoses:  Principal Problem:   Syncope Active Problems:   Pancytopenia (HCC)   Hyperchloremia   Metabolic acidosis   Hypocalcemia   Lower GI bleed   Discharge Instructions Discharge plan of care discussed with the patient daughter [name] over the phone.  Answered all of her questions appropriately to the best of my knowledge.  She verbalized understanding and is okay with the patient being discharged back to the assisted living facility.  Allergies as of 08/01/2019      Reactions   Clindamycin Hcl Other (See Comments)   REACTION: C. difficile colitis   Sulfamethoxazole-trimethoprim Other (See Comments)    Eye redness   Zanaflex [tizanidine Hcl] Anaphylaxis   Metronidazole Hives   Statins Other (See Comments)   Side Effect: muscle pain Has tried Zocor, Crestor--she does not want to take a statin, period   Bee Venom Other (See Comments)   Unknown reaction   Ace Inhibitors Cough   Adhesive [tape] Itching, Rash, Other (See Comments)   Blistering of skin      Medication List    STOP  taking these medications   furosemide 20 MG tablet Commonly known as: LASIX   losartan 100 MG tablet Commonly known as: COZAAR     TAKE these medications   acetaminophen 500 MG tablet Commonly known as: TYLENOL Take 500 mg by mouth every 6 (six) hours as needed (fever 99.5-101, minor headache, minor discomfort). What changed: Another medication with the same name was removed. Continue taking this medication, and follow the directions you see here.   alum & mag hydroxide-simeth 200-200-20 MG/5ML suspension Commonly known as: Mi-Acid Take 30 mLs by mouth every 6 (six) hours as needed for indigestion or heartburn.   amLODipine 5 MG tablet Commonly known as: NORVASC Take 1 tablet (5 mg total) by mouth daily.   bimatoprost 0.01 % Soln Commonly known as: LUMIGAN Place 1 drop into the left eye at bedtime.   bisacodyl 10 MG suppository Commonly known as: DULCOLAX Place 1 suppository (10 mg total) rectally daily as needed for moderate constipation.   diclofenac sodium 1 % Gel Commonly known as: VOLTAREN Apply 4 g topically 2 (two) times daily as needed (pain).   donepezil 5 MG tablet Commonly known as: ARICEPT Take 1 tablet (5 mg total) by mouth at bedtime.   Fish Oil 1000 MG Caps Take 1,000 mg by mouth daily.   HYDROcodone-acetaminophen 10-325 MG tablet Commonly known as: NORCO Take 1 tablet by mouth 3 (three) times daily. 8am, 2pm, 8pm   levothyroxine 100 MCG tablet Commonly known as: Synthroid Take 1 tablet (100 mcg total) by mouth daily. What  changed:   medication strength  how much to take  when to take this   lidocaine 5 % Commonly known as: Lidoderm Place 1 patch onto the skin every 12 (twelve) hours. Remove & Discard patch within 12 hours or as directed by MD. At the site of back pain   loperamide 2 MG capsule Commonly known as: IMODIUM Take 2 mg by mouth as needed for diarrhea or loose stools (do not exceed 8 dsoses in 24 hours).   magnesium hydroxide  400 MG/5ML suspension Commonly known as: MILK OF MAGNESIA Take 30 mLs by mouth at bedtime as needed (constipation).   metFORMIN 500 MG 24 hr tablet Commonly known as: GLUCOPHAGE-XR Take 1 tablet (500 mg total) by mouth daily with breakfast.   metoprolol tartrate 25 MG tablet Commonly known as: LOPRESSOR Take 0.5 tablets (12.5 mg total) by mouth 2 (two) times daily.   polyethylene glycol 17 g packet Commonly known as: MIRALAX / GLYCOLAX Take 17 g by mouth daily. What changed:   when to take this  reasons to take this   pravastatin 20 MG tablet Commonly known as: PRAVACHOL Take 1 tablet (20 mg total) by mouth daily at 6 PM.   QUEtiapine 100 MG tablet Commonly known as: SEROQUEL Take 1 tablet (100 mg total) by mouth at bedtime.   Robafen 100 MG/5ML syrup Generic drug: guaifenesin Take 200 mg by mouth every 6 (six) hours as needed for cough.   Triple Antibiotic [phone] Oint Apply 1 application topically daily as needed (minor skin tears or abrasions).       Allergies  Allergen Reactions  . Clindamycin Hcl Other (See Comments)    REACTION: C. difficile colitis  . Sulfamethoxazole-Trimethoprim Other (See Comments)     Eye redness  . Zanaflex [Tizanidine Hcl] Anaphylaxis  . Metronidazole Hives  . Statins Other (See Comments)    Side Effect: muscle pain Has tried Zocor, Crestor--she does not want to take a statin, period  . Bee Venom Other (See Comments)    Unknown reaction  . Ace Inhibitors Cough  . Adhesive [Tape] Itching, Rash and Other (See Comments)    Blistering of skin     Consultations:  None   Procedures/Studies: CT ABDOMEN PELVIS WO CONTRAST  Result Date: 07/31/2019 CLINICAL DATA:  79 year old female with melena. EXAM: CT ABDOMEN AND PELVIS WITHOUT CONTRAST TECHNIQUE: Multidetector CT imaging of the abdomen and pelvis was performed following the standard protocol without IV contrast. COMPARISON:  None. FINDINGS: Evaluation of this exam is  limited in the absence of intravenous contrast. Lower chest: The visualized lung bases are clear. No intra-abdominal free air or free fluid. Hepatobiliary: The liver is unremarkable. The gallbladder is not visualized, likely surgically absent. No retained calcified stone noted in the central CBD. Pancreas: Unremarkable. No pancreatic ductal dilatation or surrounding inflammatory changes. Spleen: Normal in size without focal abnormality. Adrenals/Urinary Tract: The adrenal glands are unremarkable. Higher attenuating content within the renal collecting systems and urinary bladder likely represents retained contrast from recent IV administration. Blood product is less likely but not excluded. Clinical correlation is recommended. There is no hydronephrosis on either side. Stomach/Bowel: There is moderate stool throughout the colon. There is no bowel obstruction or active inflammation. The appendix is not visualized with certainty. No inflammatory changes identified in the right lower quadrant. Vascular/Lymphatic: Mild aortoiliac atherosclerotic disease. The IVC is unremarkable. No portal venous gas. There is no adenopathy. Reproductive: Hysterectomy. Other: Midline vertical anterior abdominal wall incisional scar. Musculoskeletal: Osteopenia with scoliosis  and degenerative changes of the spine. Lower lumbar posterior fusion. No acute osseous pathology. IMPRESSION: 1. No acute intra-abdominal or pelvic pathology. 2. No bowel obstruction. 3. Aortic Atherosclerosis (ICD10-I70.0). Electronically Signed   By: Anner Crete M.D.   On: 07/31/2019 18:49   DG Knee 2 Views Right  Result Date: 07/30/2019 CLINICAL DATA:  Syncope, knee pain EXAM: RIGHT KNEE - 1-2 VIEW; LEFT KNEE - COMPLETE 4+ VIEW COMPARISON:  None. FINDINGS: No fracture or dislocation of the bilateral knees. Moderate arthrosis of the patellofemoral compartments. No knee joint effusion. Soft tissue edema anteriorly. IMPRESSION: No fracture or dislocation of  the bilateral knees. Electronically Signed   By: Eddie Candle M.D.   On: 07/30/2019 18:15   CT Head Wo Contrast  Result Date: 07/30/2019 CLINICAL DATA:  Syncopal episode. Head trauma, minor (Age >= 65y) EXAM: CT HEAD WITHOUT CONTRAST TECHNIQUE: Contiguous axial images were obtained from the base of the skull through the vertex without intravenous contrast. COMPARISON:  Brain MRI 1 week ago 07/23/2019, and CT 05/01/2019 FINDINGS: Brain: Stable degree of atrophy. Stable advanced chronic small vessel ischemia. No intracranial hemorrhage, mass effect, or midline shift. No hydrocephalus. The basilar cisterns are patent. Remote left cerebellar infarct with is better defined on recent MRI. No evidence of territorial infarct or acute ischemia. No extra-axial or intracranial fluid collection. Vascular: Atherosclerosis of skullbase vasculature without hyperdense vessel or abnormal calcification. Skull: No fracture or focal lesion. Sinuses/Orbits: Mucous retention cyst left maxillary sinus. The mastoid air cells are clear. The visualized orbits are unremarkable. Other: None. IMPRESSION: 1. No acute intracranial abnormality. 2. Stable atrophy and advanced chronic small vessel ischemia. Remote left cerebellar infarct was better delineated on recent MRI. Electronically Signed   By: Keith Rake M.D.   On: 07/30/2019 17:44   CT ANGIO CHEST PE W OR WO CONTRAST  Result Date: 07/31/2019 CLINICAL DATA:  Syncopal episode. EXAM: CT ANGIOGRAPHY CHEST WITH CONTRAST TECHNIQUE: Multidetector CT imaging of the chest was performed using the standard protocol during bolus administration of intravenous contrast. Multiplanar CT image reconstructions and MIPs were obtained to evaluate the vascular anatomy. CONTRAST:  82mL OMNIPAQUE IOHEXOL 350 MG/ML SOLN COMPARISON:  August 22, 2009 FINDINGS: Cardiovascular: There is mild calcification of the aortic arch. Satisfactory opacification of the pulmonary arteries to the segmental level. No  evidence of pulmonary embolism. Normal heart size. No pericardial effusion. Moderate severity coronary artery calcification is noted. Mediastinum/Nodes: No enlarged mediastinal, hilar, or axillary lymph nodes. Thyroid gland, trachea, and esophagus demonstrate no significant findings. Lungs/Pleura: A stable 4 mm noncalcified lung nodule is seen within the posterior aspect of the right apex. Additional predominant stable 6 mm noncalcified lung nodule is seen within the inferior aspect of the right upper lobe. Upper Abdomen: There is a small hiatal hernia. Musculoskeletal: Multilevel degenerative changes seen throughout the thoracic spine. Review of the MIP images confirms the above findings. IMPRESSION: 1. No CT evidence of pulmonary embolism. 2. Moderate severity coronary artery calcification. 3. Small hiatal hernia. 4. Two stable noncalcified lung nodules within the right upper lobe, the largest measuring 6 mm. No follow-up needed if patient is low-risk (and has no known or suspected primary neoplasm). Non-contrast chest CT can be considered in 12 months if patient is high-risk. This recommendation follows the consensus statement: Guidelines for Management of Incidental Pulmonary Nodules Detected on CT Images:From the Fleischner Society 2017; published online before print ( ID Nonie Hoyer. ID ). Aortic Atherosclerosis (ICD10-I70.0). Electronically Signed   By: Joyce Gross.D.  On: 07/31/2019 02:04   MR BRAIN WO CONTRAST  Result Date: 07/23/2019 CLINICAL DATA:  TIA.  Dizziness EXAM: MRI HEAD WITHOUT CONTRAST TECHNIQUE: Multiplanar, multiecho pulse sequences of the brain and surrounding structures were obtained without intravenous contrast. COMPARISON:  CT head 05/01/2019 FINDINGS: Brain: Image quality degraded by motion. Rapid scanning technique utilized due to motion also degrading image quality. Negative for acute infarct. Hyperintensity in the left lateral cerebellum. There is some volume loss in this area  on coronal T2 suggestive of chronic infarction. No change from the prior CT. Generalized atrophy. Moderate to advanced chronic microvascular ischemic changes throughout the cerebral white matter bilaterally. Negative for hemorrhage or mass. Vascular: Normal arterial flow voids. Skull and upper cervical spine: No focal skeletal lesion. Sinuses/Orbits: Mild mucosal edema left maxillary sinus. Negative orbit Other: None IMPRESSION: No acute abnormality Extensive chronic ischemic change in the white matter. Chronic infarct left lateral cerebellum. Electronically Signed   By: Franchot Gallo M.D.   On: 07/23/2019 11:27   DG Chest Port 1 View  Result Date: 07/30/2019 CLINICAL DATA:  Syncope, knee pain EXAM: PORTABLE CHEST 1 VIEW COMPARISON:  06/24/2019 FINDINGS: The heart size and mediastinal contours are within normal limits. Both lungs are clear. The visualized skeletal structures are unremarkable. IMPRESSION: Normal study. Electronically Signed   By: Rolm Baptise M.D.   On: 07/30/2019 18:15   DG Knee Complete 4 Views Left  Result Date: 07/30/2019 CLINICAL DATA:  Syncope, knee pain EXAM: RIGHT KNEE - 1-2 VIEW; LEFT KNEE - COMPLETE 4+ VIEW COMPARISON:  None. FINDINGS: No fracture or dislocation of the bilateral knees. Moderate arthrosis of the patellofemoral compartments. No knee joint effusion. Soft tissue edema anteriorly. IMPRESSION: No fracture or dislocation of the bilateral knees. Electronically Signed   By: Eddie Candle M.D.   On: 07/30/2019 18:15   EEG adult  Result Date: 07/31/2019 Lora Havens, MD     07/31/2019 11:08 AM Patient Name: Zaylei Pluff MRN: [id number] Epilepsy Attending: Lora Havens Referring Physician/Provider: Dr. Shela Leff Date: 07/31/2019 Duration: 25.16 minutes Patient history: 79 year old female who presented with syncope.  EEG evaluate for seizures. Level of alertness: Awake AEDs during EEG study: None Technical aspects: This EEG study was done with scalp electrodes  positioned according to the 10-20 International system of electrode placement. Electrical activity was acquired at a sampling rate of 500Hz  and reviewed with a high frequency filter of 70Hz  and a low frequency filter of 1Hz . EEG data were recorded continuously and digitally stored. Description: The posterior dominant rhythm consists of 8 Hz activity of moderate voltage (25-35 uV) seen predominantly in posterior head regions, symmetric and reactive to eye opening and eye closing.  Intermittent generalized 2 to 3 Hz delta slowing was also noted.  Photic driving was not seen during photic stimulation.  Hyperventilation was not performed. Abnormality -Intermittent slow, generalized IMPRESSION: This study is suggestive of mild diffuse encephalopathy, nonspecific etiology.  No seizures or epileptiform discharges were seen throughout the recording. Lora Havens   ECHOCARDIOGRAM COMPLETE  Result Date: 07/31/2019    ECHOCARDIOGRAM REPORT   Patient Name:   LANECIA PROFFIT Date of Exam: 07/31/2019 Medical Rec #:  [id number]      Height:       65.0 in Accession #:    [id number]     Weight:       149.9 lb Date of Birth:  Nov 11, 1940      BSA:          1.750 m  Patient Age:    52 years       BP:           154/77 mmHg Patient Gender: F              HR:           100 bpm. Exam Location:  Inpatient Procedure: 2D Echo, Cardiac Doppler and Color Doppler Indications:    Syncope  History:        Patient has prior history of Echocardiogram examinations, most                 recent 04/23/2016. Stroke; Risk Factors:Diabetes, Hypertension                 and Dyslipidemia. Dementia.  Sonographer:    Dustin Flock Referring Phys: [id number] [place]  1. Left ventricular ejection fraction, by estimation, is 65 to 70%. The left ventricle has normal function. The left ventricle has no regional wall motion abnormalities. There is mild concentric left ventricular hypertrophy. Indeterminate diastolic filling due to E-A  fusion.  2. Right ventricular systolic function is normal. The right ventricular size is normal. There is normal pulmonary artery systolic pressure. The estimated right ventricular systolic pressure is 123456 mmHg.  3. The mitral valve is degenerative. Trivial mitral valve regurgitation. No evidence of mitral stenosis.  4. The aortic valve is tricuspid. Aortic valve regurgitation is not visualized. Mild aortic valve stenosis.  5. The inferior vena cava is normal in size with greater than 50% respiratory variability, suggesting right atrial pressure of 3 mmHg. Comparison(s): A prior study was performed on 04/23/2016. No significant change from prior study. Prior images reviewed side by side. FINDINGS  Left Ventricle: Left ventricular ejection fraction, by estimation, is 65 to 70%. The left ventricle has normal function. The left ventricle has no regional wall motion abnormalities. The left ventricular internal cavity size was normal in size. There is  mild concentric left ventricular hypertrophy. Indeterminate diastolic filling due to E-A fusion. Right Ventricle: The right ventricular size is normal. No increase in right ventricular wall thickness. Right ventricular systolic function is normal. There is normal pulmonary artery systolic pressure. The tricuspid regurgitant velocity is 2.03 m/s, and  with an assumed right atrial pressure of 3 mmHg, the estimated right ventricular systolic pressure is 123456 mmHg. Left Atrium: Left atrial size was normal in size. Right Atrium: Right atrial size was normal in size. Pericardium: There is no evidence of pericardial effusion. Presence of pericardial fat pad. Mitral Valve: The mitral valve is degenerative in appearance. Mild mitral annular calcification. Trivial mitral valve regurgitation. No evidence of mitral valve stenosis. Tricuspid Valve: The tricuspid valve is grossly normal. Tricuspid valve regurgitation is not demonstrated. No evidence of tricuspid stenosis. Aortic Valve:  The aortic valve is tricuspid. . There is mild thickening and mild calcification of the aortic valve. Aortic valve regurgitation is not visualized. Mild aortic stenosis is present. There is mild thickening of the aortic valve. There is mild  calcification of the aortic valve. Aortic valve mean gradient measures 5.5 mmHg. Aortic valve peak gradient measures 9.5 mmHg. Aortic valve area, by VTI measures 1.56 cm. Pulmonic Valve: The pulmonic valve was grossly normal. Pulmonic valve regurgitation is not visualized. No evidence of pulmonic stenosis. Aorta: The aortic root is normal in size and structure. Venous: The inferior vena cava is normal in size with greater than 50% respiratory variability, suggesting right atrial pressure of 3 mmHg. IAS/Shunts: The interatrial septum appears to  be lipomatous. The atrial septum is grossly normal.  LEFT VENTRICLE PLAX 2D LVIDd:         3.20 cm  Diastology LVIDs:         2.40 cm  LV e' lateral:   8.38 cm/s LV PW:         1.20 cm  LV E/e' lateral: 7.4 LV IVS:        1.30 cm  LV e' medial:    6.31 cm/s LVOT diam:     1.90 cm  LV E/e' medial:  9.8 LV SV:         49 LV SV Index:   28 LVOT Area:     2.84 cm  RIGHT VENTRICLE RV Basal diam:  2.60 cm RV S prime:     9.36 cm/s TAPSE (M-mode): 2.7 cm LEFT ATRIUM             Index       RIGHT ATRIUM           Index LA diam:        3.50 cm 2.00 cm/m  RA Area:     12.40 cm LA Vol (A2C):   33.9 ml 19.37 ml/m RA Volume:   27.80 ml  15.89 ml/m LA Vol (A4C):   51.2 ml 29.26 ml/m LA Biplane Vol: 43.3 ml 24.74 ml/m  AORTIC VALVE AV Area (Vmax):    1.53 cm AV Area (Vmean):   1.37 cm AV Area (VTI):     1.56 cm AV Vmax:           154.17 cm/s AV Vmean:          113.328 cm/s AV VTI:            0.314 m AV Peak Grad:      9.5 mmHg AV Mean Grad:      5.5 mmHg LVOT Vmax:         83.30 cm/s LVOT Vmean:        54.900 cm/s LVOT VTI:          0.173 m LVOT/AV VTI ratio: 0.55  AORTA Ao Root diam: 2.70 cm MITRAL VALVE                TRICUSPID VALVE MV Area  (PHT): 4.36 cm     TR Peak grad:   16.5 mmHg MV Decel Time: 174 msec     TR Vmax:        203.00 cm/s MV E velocity: 61.80 cm/s MV A velocity: 122.00 cm/s  SHUNTS MV E/A ratio:  0.51         Systemic VTI:  0.17 m                             Systemic Diam: 1.90 cm Eleonore Chiquito MD Electronically signed by Eleonore Chiquito MD Signature Date/Time: 07/31/2019/2:13:35 PM    Final    VAS US CAROTID  Result Date: 07/31/2019 Carotid Arterial Duplex Study Indications: Syncope. Performing Technologist: Maudry Mayhew MHA, RDMS, RVT, RDCS  Examination Guidelines: A complete evaluation includes B-mode imaging, spectral Doppler, color Doppler, and power Doppler as needed of all accessible portions of each vessel. Bilateral testing is considered an integral part of a complete examination. Limited examinations for reoccurring indications may be performed as noted.  Right Carotid Findings: +----------+--------+--------+--------+-----------------------+--------+           PSV cm/sEDV cm/sStenosisPlaque Description     Comments +----------+--------+--------+--------+-----------------------+--------+ CCA  Prox  78      14                                              +----------+--------+--------+--------+-----------------------+--------+ CCA Distal62      16              smooth and heterogenous         +----------+--------+--------+--------+-----------------------+--------+ ICA Prox  39      15              smooth and heterogenous         +----------+--------+--------+--------+-----------------------+--------+ ICA Distal71      25                                              +----------+--------+--------+--------+-----------------------+--------+ ECA       58      9                                               +----------+--------+--------+--------+-----------------------+--------+ +----------+--------+-------+----------------+-------------------+           PSV cm/sEDV cmsDescribe         Arm Pressure (mmHG) +----------+--------+-------+----------------+-------------------+ [id number]             Multiphasic, WNL                    +----------+--------+-------+----------------+-------------------+ +---------+--------+--+--------+--+---------+ VertebralPSV cm/s73EDV cm/s21Antegrade +---------+--------+--+--------+--+---------+  Left Carotid Findings: +----------+--------+--------+--------+-----------------------+--------+           PSV cm/sEDV cm/sStenosisPlaque Description     Comments +----------+--------+--------+--------+-----------------------+--------+ CCA Prox  90      19                                              +----------+--------+--------+--------+-----------------------+--------+ CCA Distal74      14              smooth and heterogenous         +----------+--------+--------+--------+-----------------------+--------+ ICA Prox  71      15              smooth and heterogenous         +----------+--------+--------+--------+-----------------------+--------+ ICA Distal41      16                                              +----------+--------+--------+--------+-----------------------+--------+ ECA       58      7                                               +----------+--------+--------+--------+-----------------------+--------+ +----------+--------+--------+----------------+-------------------+           PSV cm/sEDV cm/sDescribe        Arm Pressure (mmHG) +----------+--------+--------+----------------+-------------------+ [id number]  Multiphasic, WNL                    +----------+--------+--------+----------------+-------------------+ +---------+--------+--+--------+--+---------+ VertebralPSV cm/s52EDV cm/s15Antegrade +---------+--------+--+--------+--+---------+   Summary: Right Carotid: Velocities in the right ICA are consistent with a 1-39% stenosis. Left Carotid: Velocities in the left ICA are  consistent with a 1-39% stenosis. Vertebrals:  Bilateral vertebral arteries demonstrate antegrade flow. Subclavians: Normal flow hemodynamics were seen in bilateral subclavian              arteries. *See table(s) above for measurements and observations.  Electronically signed by Antony Contras MD on 07/31/2019 at 12:31:19 PM.    Final        Subjective: Patient has dementia and has on and off confusion, agitation otherwise stable.  Discharge Exam: Vitals:   08/01/19 1009 08/01/19 1142  BP: 125/80 121/90  Pulse:  78  Resp:  14  Temp:  98 F (36.7 C)  SpO2:  95%   Vitals:   08/01/19 0421 08/01/19 0817 08/01/19 1009 08/01/19 1142  BP: (!) 144/93 136/86 125/80 121/90  Pulse:  76  78  Resp: 18 12  14   Temp: 97.8 F (36.6 C) 98.2 F (36.8 C)  98 F (36.7 C)  TempSrc: Oral Oral  Oral  SpO2: 97% 94%  95%  Weight:      Height:        General: Pt is alert, confused at times due to dementia, not in acute distress Cardiovascular: RRR, S1/S2 +, no rubs, no gallops Respiratory: CTA bilaterally, no wheezing, no rhonchi Abdominal: Soft, NT, ND, bowel sounds + Extremities: no edema, no cyanosis    The results of significant diagnostics from this hospitalization (including imaging, microbiology, ancillary and laboratory) are listed below for reference.     Microbiology: Recent Results (from the past 240 hour(s))  Urine culture     Status: Abnormal   Collection Time: 07/23/19  7:25 AM   Specimen: Urine, Random  Result Value Ref Range Status   Specimen Description   Final    URINE, RANDOM Performed at [facility] [address], [place], [place] [address]    Special Requests   Final    NONE Performed at [facility], [place] [address] [place], [place] [address]    Culture >=100,000 COLONIES/mL ESCHERICHIA COLI (A)  Final   Report Status 07/25/2019 FINAL  Final   Organism ID, Bacteria ESCHERICHIA COLI (A)  Final      Susceptibility    Escherichia coli - MIC*    AMPICILLIN 4 SENSITIVE Sensitive     CEFAZOLIN <=4 SENSITIVE Sensitive     CEFTRIAXONE <=0.25 SENSITIVE Sensitive     CIPROFLOXACIN <=0.25 SENSITIVE Sensitive     GENTAMICIN <=1 SENSITIVE Sensitive     IMIPENEM <=0.25 SENSITIVE Sensitive     NITROFURANTOIN <=16 SENSITIVE Sensitive     TRIMETH/SULFA <=20 SENSITIVE Sensitive     AMPICILLIN/SULBACTAM <=2 SENSITIVE Sensitive     PIP/TAZO <=4 SENSITIVE Sensitive     * >=100,000 COLONIES/mL ESCHERICHIA COLI  Respiratory Panel by RT PCR (Flu A&B, Covid) - Nasopharyngeal Swab     Status: None   Collection Time: 07/31/19  2:37 AM   Specimen: Nasopharyngeal Swab  Result Value Ref Range Status   SARS Coronavirus 2 by RT PCR NEGATIVE NEGATIVE Final    Comment: (NOTE) SARS-CoV-2 target nucleic acids are NOT DETECTED. The SARS-CoV-2 RNA is generally detectable in upper respiratoy specimens during the acute phase of infection. The lowest concentration  of SARS-CoV-2 viral copies this assay can detect is 131 copies/mL. A negative result does not preclude SARS-Cov-2 infection and should not be used as the sole basis for treatment or other patient management decisions. A negative result may occur with  improper specimen collection/handling, submission of specimen other than nasopharyngeal swab, presence of viral mutation(s) within the areas targeted by this assay, and inadequate number of viral copies (<131 copies/mL). A negative result must be combined with clinical observations, patient history, and epidemiological information. The expected result is Negative. Fact Sheet for Patients:  PinkCheek.be Fact Sheet for Healthcare Providers:  GravelBags.it This test is not yet ap proved or cleared by the [place] FDA and  has been authorized for detection and/or diagnosis of SARS-CoV-2 by FDA under an Emergency Use Authorization (EUA). This EUA will remain  in  effect (meaning this test can be used) for the duration of the COVID-19 declaration under Section 564(b)(1) of the Act, 21 U.S.C. section 360bbb-3(b)(1), unless the authorization is terminated or revoked sooner.    Influenza A by PCR NEGATIVE NEGATIVE Final   Influenza B by PCR NEGATIVE NEGATIVE Final    Comment: (NOTE) The Xpert Xpress SARS-CoV-2/FLU/RSV assay is intended as an aid in  the diagnosis of influenza from Nasopharyngeal swab specimens and  should not be used as a sole basis for treatment. Nasal washings and  aspirates are unacceptable for Xpert Xpress SARS-CoV-2/FLU/RSV  testing. Fact Sheet for Patients: PinkCheek.be Fact Sheet for Healthcare Providers: GravelBags.it This test is not yet approved or cleared by the [place] FDA and  has been authorized for detection and/or diagnosis of SARS-CoV-2 by  FDA under an Emergency Use Authorization (EUA). This EUA will remain  in effect (meaning this test can be used) for the duration of the  Covid-19 declaration under Section 564(b)(1) of the Act, 21  U.S.C. section 360bbb-3(b)(1), unless the authorization is  terminated or revoked. Performed at [facility], [place] [address], [place], [place] [address]      Labs: BNP (last 3 results) No results for input(s): BNP in the last 8760 hours. Basic Metabolic Panel: Recent Labs  Lab 07/30/19 1854 07/31/19 0010 08/01/19 0344  NA 143 140 137  K 4.9 3.3* 3.7  CL 120* 105 102  CO2 15* 24 23  GLUCOSE 88 195* 178*  BUN 5* 7* 9  CREATININE 0.46 0.78 0.60  CALCIUM 5.6* 9.3 9.5   Liver Function Tests: Recent Labs  Lab 07/31/19 0010  AST 25  ALT 21  ALKPHOS 59  BILITOT 0.7  PROT 6.3*  ALBUMIN 3.4*   No results for input(s): LIPASE, AMYLASE in the last 168 hours. No results for input(s): AMMONIA in the last 168 hours. CBC: Recent Labs  Lab 07/30/19 1854 07/31/19 0010 08/01/19 0344  WBC 2.0* 5.5 6.7   NEUTROABS 1.2*  --   --   HGB 8.7* 12.2 12.2  HCT 29.1* 37.9 37.9  MCV 100.0 90.7 91.5  PLT 64* 204 178   Cardiac Enzymes: No results for input(s): CKTOTAL, CKMB, CKMBINDEX, TROPONINI in the last 168 hours. BNP: Invalid input(s): POCBNP CBG: Recent Labs  Lab 07/31/19 1139 07/31/19 1650 07/31/19 2105 08/01/19 0816 08/01/19 1141  GLUCAP 152* 164* 141* 141* 163*   D-Dimer Recent Labs    07/30/19 2108  DDIMER 3.52*   Hgb A1c Recent Labs    07/30/19 2106  HGBA1C 6.7*   Lipid Profile No results for input(s): CHOL, HDL, LDLCALC, TRIG, CHOLHDL, LDLDIRECT in the last 72  hours. Thyroid function studies Recent Labs    07/30/19 2215  TSH 39.015*   Anemia work up No results for input(s): VITAMINB12, FOLATE, FERRITIN, TIBC, IRON, RETICCTPCT in the last 72 hours. Urinalysis    Component Value Date/Time   COLORURINE STRAW (A) 07/30/2019 2125   APPEARANCEUR CLEAR 07/30/2019 2125   LABSPEC 1.006 07/30/2019 2125   PHURINE 9.0 (H) 07/30/2019 2125   GLUCOSEU NEGATIVE 07/30/2019 2125   HGBUR NEGATIVE 07/30/2019 2125   HGBUR negative 02/14/2009 0938   BILIRUBINUR NEGATIVE 07/30/2019 2125   KETONESUR NEGATIVE 07/30/2019 2125   PROTEINUR NEGATIVE 07/30/2019 2125   UROBILINOGEN 0.2 06/18/2019 1708   NITRITE NEGATIVE 07/30/2019 2125   LEUKOCYTESUR NEGATIVE 07/30/2019 2125   Sepsis Labs Invalid input(s): PROCALCITONIN,  WBC,  LACTICIDVEN Microbiology Recent Results (from the past 240 hour(s))  Urine culture     Status: Abnormal   Collection Time: 07/23/19  7:25 AM   Specimen: Urine, Random  Result Value Ref Range Status   Specimen Description   Final    URINE, RANDOM Performed at [facility], [place] [address], [place], [place] [address]    Special Requests   Final    NONE Performed at [facility], [place] [address], [place], [place] [address]    Culture >=100,000 COLONIES/mL ESCHERICHIA COLI (A)  Final   Report Status 07/25/2019  FINAL  Final   Organism ID, Bacteria ESCHERICHIA COLI (A)  Final      Susceptibility   Escherichia coli - MIC*    AMPICILLIN 4 SENSITIVE Sensitive     CEFAZOLIN <=4 SENSITIVE Sensitive     CEFTRIAXONE <=0.25 SENSITIVE Sensitive     CIPROFLOXACIN <=0.25 SENSITIVE Sensitive     GENTAMICIN <=1 SENSITIVE Sensitive     IMIPENEM <=0.25 SENSITIVE Sensitive     NITROFURANTOIN <=16 SENSITIVE Sensitive     TRIMETH/SULFA <=20 SENSITIVE Sensitive     AMPICILLIN/SULBACTAM <=2 SENSITIVE Sensitive     PIP/TAZO <=4 SENSITIVE Sensitive     * >=100,000 COLONIES/mL ESCHERICHIA COLI  Respiratory Panel by RT PCR (Flu A&B, Covid) - Nasopharyngeal Swab     Status: None   Collection Time: 07/31/19  2:37 AM   Specimen: Nasopharyngeal Swab  Result Value Ref Range Status   SARS Coronavirus 2 by RT PCR NEGATIVE NEGATIVE Final    Comment: (NOTE) SARS-CoV-2 target nucleic acids are NOT DETECTED. The SARS-CoV-2 RNA is generally detectable in upper respiratoy specimens during the acute phase of infection. The lowest concentration of SARS-CoV-2 viral copies this assay can detect is 131 copies/mL. A negative result does not preclude SARS-Cov-2 infection and should not be used as the sole basis for treatment or other patient management decisions. A negative result may occur with  improper specimen collection/handling, submission of specimen other than nasopharyngeal swab, presence of viral mutation(s) within the areas targeted by this assay, and inadequate number of viral copies (<131 copies/mL). A negative result must be combined with clinical observations, patient history, and epidemiological information. The expected result is Negative. Fact Sheet for Patients:  PinkCheek.be Fact Sheet for Healthcare Providers:  GravelBags.it This test is not yet ap proved or cleared by the [place] FDA and  has been authorized for detection and/or diagnosis of  SARS-CoV-2 by FDA under an Emergency Use Authorization (EUA). This EUA will remain  in effect (meaning this test can be used) for the duration of the COVID-19 declaration under Section 564(b)(1) of the Act, 21 U.S.C. section 360bbb-3(b)(1), unless the authorization is terminated or  revoked sooner.    Influenza A by PCR NEGATIVE NEGATIVE Final   Influenza B by PCR NEGATIVE NEGATIVE Final    Comment: (NOTE) The Xpert Xpress SARS-CoV-2/FLU/RSV assay is intended as an aid in  the diagnosis of influenza from Nasopharyngeal swab specimens and  should not be used as a sole basis for treatment. Nasal washings and  aspirates are unacceptable for Xpert Xpress SARS-CoV-2/FLU/RSV  testing. Fact Sheet for Patients: PinkCheek.be Fact Sheet for Healthcare Providers: GravelBags.it This test is not yet approved or cleared by the [place] FDA and  has been authorized for detection and/or diagnosis of SARS-CoV-2 by  FDA under an Emergency Use Authorization (EUA). This EUA will remain  in effect (meaning this test can be used) for the duration of the  Covid-19 declaration under Section 564(b)(1) of the Act, 21  U.S.C. section 360bbb-3(b)(1), unless the authorization is  terminated or revoked. Performed at [facility], [place] [address], [place], [place] [address]      Time coordinating discharge: Over 30 minutes  SIGNED:   Yaakov Guthrie, MD  [facility] 08/01/2019, 3:54 PM Pager on amion  If 7PM-7AM, please contact night-coverage [email or web address] Password [email or web address]

## 2019-08-01 NOTE — Progress Notes (Signed)
Patient confused alert to self only frequently setting bed alarm off.Patient insisting upon getting up to chair tired of laying in bed.Assisited patient up to chair and chair alarm set low bed ordered for patient.

## 2019-08-01 NOTE — TOC Transition Note (Signed)
Transition of Care [facility]) - CM/SW Discharge Note   Patient Details  Name: Joy Ward MRN: [id number] Date of Birth: [birth year]  Transition of Care [facility]) CM/SW Contact:  Trula Ore, [place] Phone Number: 08/01/2019, 5:17 PM   Clinical Narrative:   Patient will DC to: [facility]   Anticipated DC date: 08/01/2019  Family notified: Joy Ward   Transport by: Daughter Joy Ward   ?  Per MD patient ready for DC to [facility] . RN, patient, patient's family, and facility notified of DC. Discharge Summary sent to facility. RN given number for report [phone]. DC packet on chart. Ambulance transport requested for patient.  CSW signing off.      Final next level of care: Assisted Living Barriers to Discharge: Continued Medical Work up   Patient Goals and CMS Choice Patient states their goals for this hospitalization and ongoing recovery are:: to go to [facility]F CMS Medicare.gov Compare Post Acute Care list provided to:: Patient Represenative (must comment)(daughter Joy Ward) Choice offered to / list presented to : Adult Children  Discharge Placement              Patient chooses bed at: ([facility]) Patient to be transferred to facility by: daughter Joy Ward Name of family member notified: Joy Ward Patient and family notified of of transfer: 08/01/19  Discharge Plan and Services                                     Social Determinants of Health (SDOH) Interventions     Readmission Risk Interventions No flowsheet data found.

## 2019-08-01 NOTE — NC FL2 (Addendum)
[place] MEDICAID FL2 LEVEL OF CARE SCREENING TOOL     IDENTIFICATION  Patient Name: Joy Ward Birthdate: 1941-01-10 Sex: female Admission Date (Current Location): 07/30/2019  [facility] and [place] Number:  Herbalist and Address:  [facility], [place] [address], [place], [place] [address]      Provider Number: [id number]  Attending Physician Name and Address:  Yaakov Guthrie, MD  Relative Name and Phone Number:  [name] [phone]    Current Level of Care: Hospital Recommended Level of Care: [place] Prior Approval Number:    Date Approved/Denied: 05/04/19 PASRR Number: [id number]  Discharge Plan: Other (Comment)(ALF [facility])    Current Diagnoses: Patient Active Problem List   Diagnosis Date Noted  . Lower GI bleed 07/31/2019  . Syncope 07/30/2019  . Pancytopenia ([place]) 07/30/2019  . Hyperchloremia 07/30/2019  . Metabolic acidosis [id number]  . Hypocalcemia 07/30/2019  . Palliative care patient 07/03/2019  . Hallucinations 06/24/2019  . Metabolic encephalopathy [id number]  . Altered mental status   . UTI (urinary tract infection) 04/30/2019  . Dementia without behavioral disturbance ([place])   . Vascular dementia with behavior disturbance ([place]) 04/14/2019  . Primary insomnia 04/14/2019  . Hypothyroidism 04/14/2019  . Diabetes mellitus ([place]) 04/14/2019  . Memory loss or impairment 03/29/2019  . Diabetic peripheral neuropathy associated with type 2 diabetes mellitus ([place]) 12/29/2017  . Peripheral edema 12/29/2017  . Elevated liver enzymes 10/15/2016  . Chest pain 04/22/2016  . Systolic murmur [id number]  . Hypercalcemia 04/22/2016  . Hyperlipidemia 04/22/2016  . Baker's cyst of knee, right 04/21/2016  . HYPOKALEMIA 01/24/2010  . CONSTIPATION 12/23/2009  . INTERMITTENT VERTIGO 12/23/2009  . SINUSITIS, ACUTE 04/25/2009  . VAGINITIS, CANDIDAL 02/14/2009  . HIP PAIN, RIGHT 02/14/2009  . HYPERLIPIDEMIA,  MIXED 01/01/2009  . BACK PAIN, LUMBAR, WITH RADICULOPATHY 12/25/2008  . KNEE PAIN, BILATERAL 12/18/2008  . TROCHANTERIC BURSITIS, BILATERAL 12/18/2008  . Disturbance in sleep behavior 12/18/2008  . ONYCHOMYCOSIS, TOENAILS 10/30/2008  . ENTERITIS, CLOSTRIDIUM DIFFICILE 10/15/2006  . Diabetes type 2, uncontrolled ([place]) 09/18/2006  . Essential hypertension 09/18/2006  . Allergic rhinitis 09/18/2006  . GOITER, MULTINODULAR 06/29/2006  . STROKE 04/27/2001    Orientation RESPIRATION BLADDER Height & Weight     Self, Place  Normal Continent, External catheter Weight: 149 lb 14.6 oz (68 kg) Height:  5\' 5"  (165.1 cm)  BEHAVIORAL SYMPTOMS/MOOD NEUROLOGICAL BOWEL NUTRITION STATUS      Continent(WDL)  Heart Healthy / Carb Modified   AMBULATORY STATUS COMMUNICATION OF NEEDS Skin   Limited Assist Verbally Normal                       Personal Care Assistance Level of Assistance  Bathing, Feeding, Dressing Bathing Assistance: Maximum assistance Feeding assistance: Independent Dressing Assistance: Maximum assistance     Functional Limitations Info  Sight, Hearing, Speech Sight Info: Adequate Hearing Info: Adequate Speech Info: Adequate    SPECIAL CARE FACTORS FREQUENCY                       Contractures Contractures Info: Not present    Additional Factors Info  Code Status, Allergies Code Status Info: FULL Allergies Info: Clindamycin Hcl,Sulfamethoxazole-trimethoprim,Zanaflex tizanidine Hcl,Metronidazole,Statins,Bee Venom,Ace Inhibitors,Adhesive tape           Medication List    STOP taking these medications   furosemide 20 MG tablet Commonly known as: LASIX   losartan 100 MG tablet Commonly known as: COZAAR  TAKE these medications   acetaminophen 500 MG tablet Commonly known as: TYLENOL Take 500 mg by mouth every 6 (six) hours as needed (fever 99.5-101, minor headache, minor discomfort). What changed: Another medication with the same name was  removed. Continue taking this medication, and follow the directions you see here.   alum & mag hydroxide-simeth 200-200-20 MG/5ML suspension Commonly known as: Mi-Acid Take 30 mLs by mouth every 6 (six) hours as needed for indigestion or heartburn.   amLODipine 5 MG tablet Commonly known as: NORVASC Take 1 tablet (5 mg total) by mouth daily.   bimatoprost 0.01 % Soln Commonly known as: LUMIGAN Place 1 drop into the left eye at bedtime.   bisacodyl 10 MG suppository Commonly known as: DULCOLAX Place 1 suppository (10 mg total) rectally daily as needed for moderate constipation.   diclofenac sodium 1 % Gel Commonly known as: VOLTAREN Apply 4 g topically 2 (two) times daily as needed (pain).   donepezil 5 MG tablet Commonly known as: ARICEPT Take 1 tablet (5 mg total) by mouth at bedtime.   Fish Oil 1000 MG Caps Take 1,000 mg by mouth daily.   HYDROcodone-acetaminophen 10-325 MG tablet Commonly known as: NORCO Take 1 tablet by mouth 3 (three) times daily. 8am, 2pm, 8pm   levothyroxine 100 MCG tablet Commonly known as: Synthroid Take 1 tablet (100 mcg total) by mouth daily. What changed:   medication strength  how much to take  when to take this   lidocaine 5 % Commonly known as: Lidoderm Place 1 patch onto the skin every 12 (twelve) hours. Remove & Discard patch within 12 hours or as directed by MD. At the site of back pain   loperamide 2 MG capsule Commonly known as: IMODIUM Take 2 mg by mouth as needed for diarrhea or loose stools (do not exceed 8 dsoses in 24 hours).   magnesium hydroxide 400 MG/5ML suspension Commonly known as: MILK OF MAGNESIA Take 30 mLs by mouth at bedtime as needed (constipation).   metFORMIN 500 MG 24 hr tablet Commonly known as: GLUCOPHAGE-XR Take 1 tablet (500 mg total) by mouth daily with breakfast.   metoprolol tartrate 25 MG tablet Commonly known as: LOPRESSOR Take 0.5 tablets (12.5 mg total) by mouth 2 (two)  times daily.   polyethylene glycol 17 g packet Commonly known as: MIRALAX / GLYCOLAX Take 17 g by mouth daily. What changed:   when to take this  reasons to take this   pravastatin 20 MG tablet Commonly known as: PRAVACHOL Take 1 tablet (20 mg total) by mouth daily at 6 PM.   QUEtiapine 100 MG tablet Commonly known as: SEROQUEL Take 1 tablet (100 mg total) by mouth at bedtime.   Robafen 100 MG/5ML syrup Generic drug: guaifenesin Take 200 mg by mouth every 6 (six) hours as needed for cough.   Triple Antibiotic [phone] Oint Apply 1 application topically daily as needed (minor skin tears or abrasions).      Relevant Imaging Results:  Relevant Lab Results:   Additional Information SSN [id number]  Trula Ore, LCSWA

## 2019-08-01 NOTE — Progress Notes (Signed)
2210 Patient given bath by nurse tech. Low bed has arrived to room. Patient now refusing to get back in bed and refusing to stay seated in chair and refusing telemetry monitor.Patient remains confused unable to reorient.Patient states," I am a grown woman you can't tell me when to go to bed and when to sit down." Tried to explain to patient concerned about her safety and that she might fall.Patient stated," No one else has been concerned so. Why are you?"Expalined to patient that I did not want her to fall and get hurt.This nurse stayed in room with patient.2240 Patient now trying to leave saying this is not where she usually sleeps and that she is leaving.Explained to patient that she is in hospital.Patient stated," I don't believe you."Called patient daughter [name] to ask her to come back to hospital to sit with her mother. [name] verbalized unable to come back to hospital tonight. [place] NP Forrest Moron paged order received for restraints.[place] Sharee Pimple in to assist with patient and patient daughter [name] called again to inform her of need for restraints and once again asked by this nurse to come sit with mother.[name] again stated,"I can't come back to hospital tonight. If you have to restrain her it's okay."[id number] Security called to assist with getting patient back in bed.Patient has been swinging her arms at staff like she is trying to hit someone and has dug her nails in one staff members hand.2240 Patient placed in soft wrist and waist belt restraints.

## 2019-08-02 ENCOUNTER — Other Ambulatory Visit: Payer: Self-pay | Admitting: *Deleted

## 2019-08-02 ENCOUNTER — Telehealth: Payer: Self-pay | Admitting: *Deleted

## 2019-08-02 DIAGNOSIS — E1169 Type 2 diabetes mellitus with other specified complication: Secondary | ICD-10-CM

## 2019-08-02 DIAGNOSIS — E785 Hyperlipidemia, unspecified: Secondary | ICD-10-CM

## 2019-08-02 DIAGNOSIS — R443 Hallucinations, unspecified: Secondary | ICD-10-CM

## 2019-08-02 MED ORDER — AMLODIPINE BESYLATE 5 MG PO TABS: 5.0000 mg | ORAL_TABLET | Freq: Every day | ORAL | 3 refills | Status: DC

## 2019-08-02 MED ORDER — PRAVASTATIN SODIUM 20 MG PO TABS: 20.0000 mg | ORAL_TABLET | Freq: Every day | ORAL | 3 refills | Status: DC

## 2019-08-02 MED ORDER — METFORMIN HCL ER 500 MG PO TB24: 500.0000 mg | ORAL_TABLET | Freq: Every day | ORAL | 3 refills | Status: DC

## 2019-08-02 MED ORDER — DONEPEZIL HCL 5 MG PO TABS: 5.0000 mg | ORAL_TABLET | Freq: Every day | ORAL | 3 refills | Status: DC

## 2019-08-02 MED ORDER — QUETIAPINE FUMARATE 100 MG PO TABS: 100.0000 mg | ORAL_TABLET | Freq: Every day | ORAL | 3 refills | Status: DC

## 2019-08-02 MED ORDER — LEVOTHYROXINE SODIUM 100 MCG PO TABS: 100.0000 ug | ORAL_TABLET | Freq: Every day | ORAL | 3 refills | Status: DC

## 2019-08-02 NOTE — Telephone Encounter (Signed)
Patient daughter called requesting refills. Stated that patient will be out of them tomorrow. Requesting Rx's to go to [facility]. Rx's sent. Patient has an appointment on Monday 4/12 with Dr. Mariea Clonts.   Pended Pravastatin and sent to Dr. Mariea Clonts for approval due to [place].

## 2019-08-02 NOTE — Telephone Encounter (Signed)
Transition Care Management Follow-up Telephone Call  Date of discharge and from where: 08/01/19 [place]  How have you been since you were released from the hospital? Better being seen now at Dr. Gabriel Carina  Any questions or concerns? Yes  Needs refills on medications. Will call back at Specialist office now being seen.   Items Reviewed:  Did the pt receive and understand the discharge instructions provided? Yes   Medications obtained and verified? Yes  will call back to request refills to be sent to [facility].   Any new allergies since your discharge? No   Dietary orders reviewed? Yes  Do you have support at home? Yes   Other (ie: DME, Home Health, etc) at [place]: (I = Independent and D = Dependent) ADL's: I with assistance  Bathing/Dressing- I with assistance   Meal Prep- D  Eating- I  Maintaining continence- I  Transferring/Ambulation- I with assistance  Managing Meds- D   Follow up appointments reviewed:    PCP Hospital f/u appt confirmed? Yes  Scheduled to see 08/07/19.  [facility] f/u appt confirmed? No    Are transportation arrangements needed? No   If their condition worsens, is the pt aware to call  their PCP or go to the ED? Yes  Was the patient provided with contact information for the PCP's office or ED? Yes  Was the pt encouraged to call back with questions or concerns? Yes

## 2019-08-04 ENCOUNTER — Telehealth: Payer: Self-pay

## 2019-08-04 ENCOUNTER — Ambulatory Visit: Payer: Self-pay | Admitting: Internal Medicine

## 2019-08-04 NOTE — Telephone Encounter (Signed)
yes, please do suggest to her that she reach out to [place] for an acute visit if her mom is ill because many times we can help run tests and get her taken care of without putting her through the stress of the emergency room. I left message for linda to call me back.

## 2019-08-06 ENCOUNTER — Encounter (HOSPITAL_COMMUNITY): Payer: Self-pay | Admitting: Pharmacy Technician

## 2019-08-06 ENCOUNTER — Emergency Department (HOSPITAL_COMMUNITY): Payer: Medicare Other

## 2019-08-06 ENCOUNTER — Observation Stay (HOSPITAL_COMMUNITY)
Admission: EM | Admit: 2019-08-06 | Discharge: 2019-08-07 | Disposition: A | Discharge status: Nursing Home (Includes ICF) | Payer: Medicare Other | Attending: Internal Medicine | Admitting: Internal Medicine

## 2019-08-06 ENCOUNTER — Other Ambulatory Visit: Payer: Self-pay

## 2019-08-06 DIAGNOSIS — E782 Mixed hyperlipidemia: Secondary | ICD-10-CM | POA: Insufficient documentation

## 2019-08-06 DIAGNOSIS — R9082 White matter disease, unspecified: Secondary | ICD-10-CM | POA: Diagnosis not present

## 2019-08-06 DIAGNOSIS — F0151 Vascular dementia with behavioral disturbance: Secondary | ICD-10-CM | POA: Diagnosis not present

## 2019-08-06 DIAGNOSIS — R918 Other nonspecific abnormal finding of lung field: Secondary | ICD-10-CM | POA: Diagnosis not present

## 2019-08-06 DIAGNOSIS — E1142 Type 2 diabetes mellitus with diabetic polyneuropathy: Secondary | ICD-10-CM | POA: Diagnosis not present

## 2019-08-06 DIAGNOSIS — Z79899 Other long term (current) drug therapy: Secondary | ICD-10-CM | POA: Diagnosis not present

## 2019-08-06 DIAGNOSIS — Z881 Allergy status to other antibiotic agents status: Secondary | ICD-10-CM | POA: Insufficient documentation

## 2019-08-06 DIAGNOSIS — Z87891 Personal history of nicotine dependence: Secondary | ICD-10-CM | POA: Diagnosis not present

## 2019-08-06 DIAGNOSIS — Z20822 Contact with and (suspected) exposure to covid-19: Secondary | ICD-10-CM | POA: Diagnosis not present

## 2019-08-06 DIAGNOSIS — M545 Low back pain: Secondary | ICD-10-CM | POA: Insufficient documentation

## 2019-08-06 DIAGNOSIS — G319 Degenerative disease of nervous system, unspecified: Secondary | ICD-10-CM | POA: Insufficient documentation

## 2019-08-06 DIAGNOSIS — E039 Hypothyroidism, unspecified: Secondary | ICD-10-CM | POA: Diagnosis not present

## 2019-08-06 DIAGNOSIS — Z7984 Long term (current) use of oral hypoglycemic drugs: Secondary | ICD-10-CM | POA: Insufficient documentation

## 2019-08-06 DIAGNOSIS — G8929 Other chronic pain: Secondary | ICD-10-CM | POA: Diagnosis not present

## 2019-08-06 DIAGNOSIS — N390 Urinary tract infection, site not specified: Secondary | ICD-10-CM | POA: Diagnosis not present

## 2019-08-06 DIAGNOSIS — E890 Postprocedural hypothyroidism: Secondary | ICD-10-CM | POA: Insufficient documentation

## 2019-08-06 DIAGNOSIS — I119 Hypertensive heart disease without heart failure: Secondary | ICD-10-CM | POA: Insufficient documentation

## 2019-08-06 DIAGNOSIS — Z888 Allergy status to other drugs, medicaments and biological substances status: Secondary | ICD-10-CM | POA: Insufficient documentation

## 2019-08-06 DIAGNOSIS — R55 Syncope and collapse: Principal | ICD-10-CM | POA: Insufficient documentation

## 2019-08-06 DIAGNOSIS — Z8249 Family history of ischemic heart disease and other diseases of the circulatory system: Secondary | ICD-10-CM | POA: Insufficient documentation

## 2019-08-06 DIAGNOSIS — Z79890 Hormone replacement therapy (postmenopausal): Secondary | ICD-10-CM | POA: Diagnosis not present

## 2019-08-06 DIAGNOSIS — I10 Essential (primary) hypertension: Secondary | ICD-10-CM | POA: Diagnosis not present

## 2019-08-06 DIAGNOSIS — M1990 Unspecified osteoarthritis, unspecified site: Secondary | ICD-10-CM | POA: Diagnosis not present

## 2019-08-06 DIAGNOSIS — E119 Type 2 diabetes mellitus without complications: Secondary | ICD-10-CM

## 2019-08-06 DIAGNOSIS — Z8673 Personal history of transient ischemic attack (TIA), and cerebral infarction without residual deficits: Secondary | ICD-10-CM | POA: Insufficient documentation

## 2019-08-06 DIAGNOSIS — Z66 Do not resuscitate: Secondary | ICD-10-CM | POA: Diagnosis not present

## 2019-08-06 DIAGNOSIS — M4812 Ankylosing hyperostosis [Forestier], cervical region: Secondary | ICD-10-CM | POA: Diagnosis not present

## 2019-08-06 DIAGNOSIS — F01518 Vascular dementia, unspecified severity, with other behavioral disturbance: Secondary | ICD-10-CM | POA: Diagnosis present

## 2019-08-06 DIAGNOSIS — Z833 Family history of diabetes mellitus: Secondary | ICD-10-CM | POA: Insufficient documentation

## 2019-08-06 DIAGNOSIS — M6281 Muscle weakness (generalized): Secondary | ICD-10-CM | POA: Insufficient documentation

## 2019-08-06 DIAGNOSIS — R231 Pallor: Secondary | ICD-10-CM | POA: Diagnosis not present

## 2019-08-06 DIAGNOSIS — F0390 Unspecified dementia without behavioral disturbance: Secondary | ICD-10-CM | POA: Diagnosis present

## 2019-08-06 LAB — CBC WITH DIFFERENTIAL/PLATELET
Abs Immature Granulocytes: 0.03 10*3/uL (ref 0.00–0.07)
Basophils Absolute: 0 10*3/uL (ref 0.0–0.1)
Basophils Relative: 1 %
Eosinophils Absolute: 0.1 10*3/uL (ref 0.0–0.5)
Eosinophils Relative: 2 %
HCT: 38.9 % (ref 36.0–46.0)
Hemoglobin: 12.4 g/dL (ref 12.0–15.0)
Immature Granulocytes: 1 %
Lymphocytes Relative: 22 %
Lymphs Abs: 1.4 10*3/uL (ref 0.7–4.0)
MCH: 29.8 pg (ref 26.0–34.0)
MCHC: 31.9 g/dL (ref 30.0–36.0)
MCV: 93.5 fL (ref 80.0–100.0)
Monocytes Absolute: 0.5 10*3/uL (ref 0.1–1.0)
Monocytes Relative: 7 %
Neutro Abs: 4.3 10*3/uL (ref 1.7–7.7)
Neutrophils Relative %: 67 %
Platelets: 233 10*3/uL (ref 150–400)
RBC: 4.16 MIL/uL (ref 3.87–5.11)
RDW: 14.1 % (ref 11.5–15.5)
WBC: 6.4 10*3/uL (ref 4.0–10.5)
nRBC: 0 % (ref 0.0–0.2)

## 2019-08-06 LAB — COMPREHENSIVE METABOLIC PANEL
ALT: 15 U/L (ref 0–44)
AST: 21 U/L (ref 15–41)
Albumin: 3.5 g/dL (ref 3.5–5.0)
Alkaline Phosphatase: 55 U/L (ref 38–126)
Anion gap: 11 (ref 5–15)
BUN: 10 mg/dL (ref 8–23)
CO2: 24 mmol/L (ref 22–32)
Calcium: 9.6 mg/dL (ref 8.9–10.3)
Chloride: 105 mmol/L (ref 98–111)
Creatinine, Ser: 0.86 mg/dL (ref 0.44–1.00)
GFR calc Af Amer: >60 mL/min (ref >60)
GFR calc non Af Amer: >60 mL/min (ref >60)
Glucose, Bld: 125 mg/dL — ABNORMAL HIGH (ref 70–99)
Potassium: 3.9 mmol/L (ref 3.5–5.1)
Sodium: 140 mmol/L (ref 135–145)
Total Bilirubin: 0.8 mg/dL (ref 0.3–1.2)
Total Protein: 6.5 g/dL (ref 6.5–8.1)

## 2019-08-06 LAB — URINALYSIS, ROUTINE W REFLEX MICROSCOPIC
Bilirubin Urine: NEGATIVE
Glucose, UA: NEGATIVE mg/dL
Hgb urine dipstick: NEGATIVE
Ketones, ur: NEGATIVE mg/dL
Leukocytes,Ua: NEGATIVE
Nitrite: NEGATIVE
Protein, ur: NEGATIVE mg/dL
Specific Gravity, Urine: 1.015 (ref 1.005–1.030)
pH: 6 (ref 5.0–8.0)

## 2019-08-06 LAB — SARS CORONAVIRUS 2 (TAT 6-24 HRS): SARS Coronavirus 2: NEGATIVE

## 2019-08-06 LAB — TROPONIN I (HIGH SENSITIVITY)
Troponin I (High Sensitivity): 4 ng/L (ref <18)
Troponin I (High Sensitivity): 4 ng/L (ref <18)

## 2019-08-06 LAB — GLUCOSE, CAPILLARY: Glucose-Capillary: 113 mg/dL — ABNORMAL HIGH (ref 70–99)

## 2019-08-06 MED ORDER — ACETAMINOPHEN 325 MG PO TABS: 650.0000 mg | ORAL_TABLET | Freq: Four times a day (QID) | ORAL | Status: DC | PRN

## 2019-08-06 MED ORDER — LACTATED RINGERS IV SOLN: INTRAVENOUS | Status: AC

## 2019-08-06 MED ORDER — ONDANSETRON HCL 4 MG PO TABS: 4.0000 mg | ORAL_TABLET | Freq: Four times a day (QID) | ORAL | Status: DC | PRN

## 2019-08-06 MED ORDER — SODIUM CHLORIDE 0.9% FLUSH
3.0000 mL | Freq: Two times a day (BID) | INTRAVENOUS | Status: DC
  Administered 2019-08-06 – 2019-08-07 (×2): 3 mL via INTRAVENOUS

## 2019-08-06 MED ORDER — INSULIN ASPART 100 UNIT/ML ~~LOC~~ SOLN
0.0000 [IU] | Freq: Three times a day (TID) | SUBCUTANEOUS | Status: DC
  Administered 2019-08-07: 13:00:00 2 [IU] via SUBCUTANEOUS

## 2019-08-06 MED ORDER — HYDROCODONE-ACETAMINOPHEN 5-325 MG PO TABS
1.0000 | ORAL_TABLET | ORAL | Status: DC | PRN
  Administered 2019-08-06 – 2019-08-07 (×3): 1 via ORAL
  Filled 2019-08-06 (×3): qty 1

## 2019-08-06 MED ORDER — QUETIAPINE FUMARATE 25 MG PO TABS
100.0000 mg | ORAL_TABLET | Freq: Every day | ORAL | Status: DC
  Administered 2019-08-06: 100 mg via ORAL
  Filled 2019-08-06: qty 4

## 2019-08-06 MED ORDER — ONDANSETRON HCL 4 MG/2ML IJ SOLN: 4.0000 mg | Freq: Four times a day (QID) | INTRAMUSCULAR | Status: DC | PRN

## 2019-08-06 MED ORDER — PRAVASTATIN SODIUM 10 MG PO TABS
20.0000 mg | ORAL_TABLET | Freq: Every day | ORAL | Status: DC
  Administered 2019-08-06 – 2019-08-07 (×2): 20 mg via ORAL
  Filled 2019-08-06 (×2): qty 2

## 2019-08-06 MED ORDER — HYDRALAZINE HCL 20 MG/ML IJ SOLN: 5.0000 mg | INTRAMUSCULAR | Status: DC | PRN

## 2019-08-06 MED ORDER — BISACODYL 5 MG PO TBEC: 5.0000 mg | DELAYED_RELEASE_TABLET | Freq: Every day | ORAL | Status: DC | PRN

## 2019-08-06 MED ORDER — MORPHINE SULFATE (PF) 2 MG/ML IV SOLN
2.0000 mg | INTRAVENOUS | Status: DC | PRN
  Administered 2019-08-06: 2 mg via INTRAVENOUS
  Filled 2019-08-06: qty 1

## 2019-08-06 MED ORDER — POLYETHYLENE GLYCOL 3350 17 G PO PACK: 17.0000 g | PACK | Freq: Every day | ORAL | Status: DC | PRN

## 2019-08-06 MED ORDER — ENOXAPARIN SODIUM 40 MG/0.4ML ~~LOC~~ SOLN
40.0000 mg | SUBCUTANEOUS | Status: DC
  Administered 2019-08-06 – 2019-08-07 (×2): 40 mg via SUBCUTANEOUS
  Filled 2019-08-06 (×2): qty 0.4

## 2019-08-06 MED ORDER — LEVOTHYROXINE SODIUM 100 MCG PO TABS
100.0000 ug | ORAL_TABLET | Freq: Every day | ORAL | Status: DC
  Administered 2019-08-07: 100 ug via ORAL
  Filled 2019-08-06: qty 1

## 2019-08-06 MED ORDER — DOCUSATE SODIUM 100 MG PO CAPS
100.0000 mg | ORAL_CAPSULE | Freq: Two times a day (BID) | ORAL | Status: DC
  Administered 2019-08-06 – 2019-08-07 (×2): 100 mg via ORAL
  Filled 2019-08-06 (×2): qty 1

## 2019-08-06 MED ORDER — TRAZODONE HCL 50 MG PO TABS
100.0000 mg | ORAL_TABLET | Freq: Every evening | ORAL | Status: DC | PRN
  Administered 2019-08-07: 100 mg via ORAL
  Filled 2019-08-06: qty 2

## 2019-08-06 MED ORDER — DONEPEZIL HCL 5 MG PO TABS
5.0000 mg | ORAL_TABLET | Freq: Every day | ORAL | Status: DC
  Administered 2019-08-06: 23:00:00 5 mg via ORAL
  Filled 2019-08-06: qty 1

## 2019-08-06 MED ORDER — ACETAMINOPHEN 650 MG RE SUPP: 650.0000 mg | Freq: Four times a day (QID) | RECTAL | Status: DC | PRN

## 2019-08-06 MED ORDER — ZOLPIDEM TARTRATE 5 MG PO TABS
5.0000 mg | ORAL_TABLET | Freq: Every evening | ORAL | Status: DC | PRN
  Filled 2019-08-06: qty 1

## 2019-08-06 NOTE — ED Notes (Signed)
Patient transported to CT 

## 2019-08-06 NOTE — ED Notes (Signed)
Pt requested medication to help her sleep. RN offered Ambien. Pt and family member stated the pt normally takes trazodone and Seroquel. Pt wants to want until she is moved upstairs. RN will relay in report.

## 2019-08-06 NOTE — ED Triage Notes (Signed)
Pt bib ems from [place] with c/o syncopal episode. Staff state they heard her fall, pt initially unconscious. Pt cool, clammy on scene. Pt does not recall the event. 12 lead unremarkable. No blood thinners. VSS with ems. CBG 154.

## 2019-08-06 NOTE — ED Provider Notes (Signed)
[facility] EMERGENCY DEPARTMENT Provider Note   CSN: [id number] Arrival date & time: 08/06/19  1136     History Chief Complaint  Patient presents with  . Loss of Consciousness    Joy Ward is a 79 y.o. female.  Patient is a 79 year old female with past medical history of hypertension, hypothyroidism, stroke, diabetes, presenting to the emergency department from her assisted living facility for evaluation of syncope.  Per EMS, staff heard the patient fall but did not witness her fall.  She was unconscious for a few seconds before coming to.  She was also cool and clammy at that time.  The patient does not recall the event and reports that she had just eaten lunch and was laying in bed when she woke up to paramedics.  Patient had an admission to the hospital on the fourth of this month for the same.  Etiology of her syncope at that time was unclear.  Patient currently reports that she just generally is feeling unwell but has no specific complaints.  She denies any chest pain, shortness of breath, numbness, tingling, weakness.        Past Medical History:  Diagnosis Date  . Allergy   . Arthritis   . Chronic back pain   . DDD (degenerative disc disease), lumbar   . Diabetes mellitus without complication ([place])   . Diabetes type 2, uncontrolled ([place]) 09/18/2006   Annotation: Noninsulin dependent Qualifier: Diagnosis of  By: Amil Amen MD, Benjamine Mola    . Former tobacco use   . Hyperlipidemia    a. H/o muscle aches with statins per PCP notes.  . Hypertension   . Hypothyroidism    a. prior thyroid removal.  . Memory loss or impairment 03/29/2019  . Stroke [facility])    a. Listed in PCP notes: "History of stroke:  History of terrible headache.  Had a CT scan of brain and found "ministroke"  Was removed from ERT subsequently. "    Patient Active Problem List   Diagnosis Date Noted  . Lower GI bleed 07/31/2019  . Syncope 07/30/2019  . Pancytopenia ([place]) 07/30/2019   . Hyperchloremia 07/30/2019  . Metabolic acidosis [id number]  . Hypocalcemia 07/30/2019  . Palliative care patient 07/03/2019  . Hallucinations 06/24/2019  . Metabolic encephalopathy [id number]  . Altered mental status   . UTI (urinary tract infection) 04/30/2019  . Dementia without behavioral disturbance ([place])   . Vascular dementia with behavior disturbance ([place]) 04/14/2019  . Primary insomnia 04/14/2019  . Hypothyroidism 04/14/2019  . Diabetes mellitus ([place]) 04/14/2019  . Memory loss or impairment 03/29/2019  . Diabetic peripheral neuropathy associated with type 2 diabetes mellitus ([place]) 12/29/2017  . Peripheral edema 12/29/2017  . Elevated liver enzymes 10/15/2016  . Chest pain 04/22/2016  . Systolic murmur [id number]  . Hypercalcemia 04/22/2016  . Hyperlipidemia 04/22/2016  . Baker's cyst of knee, right 04/21/2016  . HYPOKALEMIA 01/24/2010  . CONSTIPATION 12/23/2009  . INTERMITTENT VERTIGO 12/23/2009  . SINUSITIS, ACUTE 04/25/2009  . VAGINITIS, CANDIDAL 02/14/2009  . HIP PAIN, RIGHT 02/14/2009  . HYPERLIPIDEMIA, MIXED 01/01/2009  . BACK PAIN, LUMBAR, WITH RADICULOPATHY 12/25/2008  . KNEE PAIN, BILATERAL 12/18/2008  . TROCHANTERIC BURSITIS, BILATERAL 12/18/2008  . Disturbance in sleep behavior 12/18/2008  . ONYCHOMYCOSIS, TOENAILS 10/30/2008  . ENTERITIS, CLOSTRIDIUM DIFFICILE 10/15/2006  . Diabetes type 2, uncontrolled ([place]) 09/18/2006  . Essential hypertension 09/18/2006  . Allergic rhinitis 09/18/2006  . GOITER, MULTINODULAR 06/29/2006  . STROKE 04/27/2001    Past Surgical History:  Procedure Laterality Date  . ABDOMINAL HYSTERECTOMY  1994   TAH, not clear if unilateral oophorectomy as well.  [facility] [place]  . CHOLECYSTECTOMY  1973   open  . DECOMPRESSIVE LUMBAR LAMINECTOMY LEVEL 4  2005   In [place]  . THYROIDECTOMY  2012   multinodular goiter     OB History   No obstetric history on file.     Family History  Problem  Relation Age of Onset  . Heart disease Mother        unclear details "atherosclerosis"  . Diabetes Mother   . Alcohol abuse Sister   . Diabetes Brother   . Diabetes Brother   . Drug abuse Brother   . Diabetes Daughter   . Heart disease Son        CHF  . Alcohol abuse Son     Social History   Tobacco Use  . Smoking status: Former Research scientist (life sciences)  . Smokeless tobacco: Never Used  . Tobacco comment: No smoking since 79 yo - smoked 10 yrs  Substance Use Topics  . Alcohol use: No  . Drug use: No    Home Medications Prior to Admission medications   Medication Sig Start Date End Date Taking? Authorizing Provider  acetaminophen (TYLENOL) 500 MG tablet Take 500 mg by mouth every 6 (six) hours as needed (fever 99.5-101, minor headache, minor discomfort).   Yes [provider]  amLODipine (NORVASC) 5 MG tablet Take 1 tablet (5 mg total) by mouth daily. 08/02/19  Yes Reed, Tiffany L, DO  bisacodyl (DULCOLAX) 10 MG suppository Place 1 suppository (10 mg total) rectally daily as needed for moderate constipation. 06/28/19  Yes Pokhrel, Laxman, MD  donepezil (ARICEPT) 5 MG tablet Take 1 tablet (5 mg total) by mouth at bedtime. 08/02/19  Yes Reed, Tiffany L, DO  furosemide (LASIX) 20 MG tablet Take 20 mg by mouth daily.   Yes [provider]  HYDROcodone-acetaminophen (NORCO) 10-325 MG tablet Take 1 tablet by mouth every 6 (six) hours as needed for moderate pain.    Yes [provider]  levothyroxine (SYNTHROID) 100 MCG tablet Take 1 tablet (100 mcg total) by mouth daily. 08/02/19 08/01/20 Yes Reed, Tiffany L, DO  loperamide (IMODIUM) 2 MG capsule Take 2 mg by mouth as needed for diarrhea or loose stools (do not exceed 8 dsoses in 24 hours).   Yes [provider]  losartan (COZAAR) 100 MG tablet Take 100 mg by mouth daily.   Yes [provider]  magnesium hydroxide (MILK OF MAGNESIA) 400 MG/5ML suspension Take 30 mLs by mouth at bedtime as needed (constipation). 08/01/19   Yes Matcha, Anupama, MD  Omega-3 Fatty Acids (FISH OIL) 1000 MG CAPS Take 1,000 mg by mouth daily.    Yes [provider]  polyethylene glycol (MIRALAX / GLYCOLAX) 17 g packet Take 17 g by mouth daily. 08/01/19  Yes Matcha, Anupama, MD  pravastatin (PRAVACHOL) 20 MG tablet Take 1 tablet (20 mg total) by mouth daily at 6 PM. 08/02/19  Yes Reed, Tiffany L, DO  QUEtiapine (SEROQUEL) 100 MG tablet Take 100 mg by mouth at bedtime.   Yes [provider]  alum & mag hydroxide-simeth (MI-ACID) 200-200-20 MG/5ML suspension Take 30 mLs by mouth every 6 (six) hours as needed for indigestion or heartburn. Patient not taking: Reported on 08/06/2019 08/01/19   Matcha, Beverely Pace, MD  bimatoprost (LUMIGAN) 0.01 % SOLN Place 1 drop into the left eye at bedtime. Patient not taking:  Reported on 07/23/2019 03/04/18   Mack Hook, MD  diclofenac sodium (VOLTAREN) 1 % GEL Apply 4 g topically 2 (two) times daily as needed (pain). Patient not taking: Reported on 07/23/2019 03/04/18   Mack Hook, MD  lidocaine (LIDODERM) 5 % Place 1 patch onto the skin every 12 (twelve) hours. Remove & Discard patch within 12 hours or as directed by MD. At the site of back pain Patient not taking: Reported on 07/23/2019 06/28/19 06/27/20  Flora Lipps, MD  metFORMIN (GLUCOPHAGE-XR) 500 MG 24 hr tablet Take 1 tablet (500 mg total) by mouth daily with breakfast. Patient not taking: Reported on 08/06/2019 08/02/19   Hollace Kinnier L, DO  metoprolol tartrate (LOPRESSOR) 25 MG tablet Take 0.5 tablets (12.5 mg total) by mouth 2 (two) times daily. Patient not taking: Reported on 08/06/2019 08/01/19 07/31/20  Yaakov Guthrie, MD  QUEtiapine (SEROQUEL) 100 MG tablet Take 1 tablet (100 mg total) by mouth at bedtime. Patient not taking: Reported on 08/06/2019 08/02/19   Hollace Kinnier L, DO    Allergies    Clindamycin hcl, Sulfamethoxazole-trimethoprim, Zanaflex [tizanidine hcl], Metronidazole, Statins, Bee venom, Ace inhibitors, and  Adhesive [tape]  Review of Systems   Review of Systems  Constitutional: Positive for fatigue. Negative for activity change, chills and fever.  HENT: Negative for ear pain and sore throat.   Eyes: Negative for pain and visual disturbance.  Respiratory: Negative for cough and shortness of breath.   Cardiovascular: Negative for chest pain and palpitations.  Gastrointestinal: Negative for abdominal pain and vomiting.  Genitourinary: Negative for dysuria and hematuria.  Musculoskeletal: Negative for arthralgias and back pain.  Skin: Negative for color change and rash.  Neurological: Negative for seizures and syncope.  All other systems reviewed and are negative.   Physical Exam Updated Vital Signs BP 127/71   Pulse 73   Temp 98 F (36.7 C) (Oral)   Resp 20   SpO2 96%   Physical Exam Vitals and nursing note reviewed.  Constitutional:      General: She is not in acute distress.    Appearance: Normal appearance. She is not ill-appearing, toxic-appearing or diaphoretic.  HENT:     Head: Normocephalic.     Nose: Nose normal.     Mouth/Throat:     Mouth: Mucous membranes are moist.  Eyes:     Conjunctiva/sclera: Conjunctivae normal.  Neck:     Comments: Diffuse muscular tenderness Cardiovascular:     Rate and Rhythm: Normal rate and regular rhythm.  Pulmonary:     Effort: Pulmonary effort is normal.  Musculoskeletal:        General: No swelling, deformity or signs of injury.     Cervical back: Tenderness present.     Comments: No bony tenderness, or deformities  Skin:    General: Skin is dry.     Findings: No bruising, erythema or rash.  Neurological:     General: No focal deficit present.     Mental Status: She is alert.     Cranial Nerves: No cranial nerve deficit.     Sensory: No sensory deficit.     Motor: No weakness.  Psychiatric:        Mood and Affect: Mood normal.     ED Results / Procedures / Treatments   Labs (all labs ordered are listed, but only  abnormal results are displayed) Labs Reviewed  COMPREHENSIVE METABOLIC PANEL - Abnormal; Notable for the following components:      Result Value   Glucose,  Bld 125 (*)    All other components within normal limits  CBC WITH DIFFERENTIAL/PLATELET  URINALYSIS, ROUTINE W REFLEX MICROSCOPIC  TROPONIN I (HIGH SENSITIVITY)  TROPONIN I (HIGH SENSITIVITY)    EKG EKG Interpretation  Date/Time:  Sunday August 06 2019 11:44:07 EDT Ventricular Rate:  61 PR Interval:    QRS Duration: 95 QT Interval:  432 QTC Calculation: 436 R Axis:   41 Text Interpretation: Sinus rhythm Prolonged PR interval Borderline T abnormalities, diffuse leads Confirmed by Lacretia Leigh ([id number]) on 08/06/2019 12:20:27 PM   Radiology CT Head Wo Contrast  Result Date: 08/06/2019 CLINICAL DATA:  Syncope, recurrent EXAM: CT HEAD WITHOUT CONTRAST CT CERVICAL SPINE WITHOUT CONTRAST TECHNIQUE: Multidetector CT imaging of the head and cervical spine was performed following the standard protocol without intravenous contrast. Multiplanar CT image reconstructions of the cervical spine were also generated. COMPARISON:  07/30/2019 FINDINGS: CT HEAD FINDINGS Brain: No evidence of acute infarction, hemorrhage, hydrocephalus, extra-axial collection or mass lesion/mass effect. Extensive white matter disease and signs of atrophy as before. Remote cerebellar infarct better demonstrated on previous MRIs. Vascular: No hyperdense vessel or unexpected calcification. Skull: Normal. Negative for fracture or focal lesion. Sinuses/Orbits: Mucous retention cyst or polyp in the left maxillary sinus. Similar to prior study. Other: None CT CERVICAL SPINE FINDINGS Alignment: Normal. Skull base and vertebrae: No acute fracture. No primary bone lesion or focal pathologic process. Soft tissues and spinal canal: No prevertebral fluid or swelling. No visible canal hematoma. Signs of thyroidectomy incidentally noted. Disc levels: Multilevel degenerative changes  greatest at C5-6 and C6-7 with uncovertebral degenerative changes at these levels worse at C6-7 and greatest on the left with neural foraminal narrowing as result. Facet arthropathy is greatest also on the left at at C4-5 with degenerative ankylosis of facets at this level. Upper chest: 4 mm right upper lobe nodule. 3 mm left upper lobe nodule. Other: None IMPRESSION: 1. No acute intracranial abnormality. 2. Extensive white matter disease and signs of atrophy as before. 3. No evidence for acute fracture or subluxation of the cervical spine. 4. Multilevel degenerative changes of the cervical spine greatest at C5-6 and C6-7 with left greater than right neural foraminal narrowing. 5. Small pulmonary nodules in the lung apices. No follow-up needed if patient is low-risk (and has no known or suspected primary neoplasm). Non-contrast chest CT can be considered in 12 months if patient is high-risk. This recommendation follows the consensus statement: Guidelines for Management of Incidental Pulmonary Nodules Detected on CT Images: From the Fleischner Society 2017; Radiology 2017; 284:228-243. Electronically Signed   By: Zetta Bills M.D.   On: 08/06/2019 13:57   CT Cervical Spine Wo Contrast  Result Date: 08/06/2019 CLINICAL DATA:  Syncope, recurrent EXAM: CT HEAD WITHOUT CONTRAST CT CERVICAL SPINE WITHOUT CONTRAST TECHNIQUE: Multidetector CT imaging of the head and cervical spine was performed following the standard protocol without intravenous contrast. Multiplanar CT image reconstructions of the cervical spine were also generated. COMPARISON:  07/30/2019 FINDINGS: CT HEAD FINDINGS Brain: No evidence of acute infarction, hemorrhage, hydrocephalus, extra-axial collection or mass lesion/mass effect. Extensive white matter disease and signs of atrophy as before. Remote cerebellar infarct better demonstrated on previous MRIs. Vascular: No hyperdense vessel or unexpected calcification. Skull: Normal. Negative for  fracture or focal lesion. Sinuses/Orbits: Mucous retention cyst or polyp in the left maxillary sinus. Similar to prior study. Other: None CT CERVICAL SPINE FINDINGS Alignment: Normal. Skull base and vertebrae: No acute fracture. No primary bone lesion or focal pathologic process.  Soft tissues and spinal canal: No prevertebral fluid or swelling. No visible canal hematoma. Signs of thyroidectomy incidentally noted. Disc levels: Multilevel degenerative changes greatest at C5-6 and C6-7 with uncovertebral degenerative changes at these levels worse at C6-7 and greatest on the left with neural foraminal narrowing as result. Facet arthropathy is greatest also on the left at at C4-5 with degenerative ankylosis of facets at this level. Upper chest: 4 mm right upper lobe nodule. 3 mm left upper lobe nodule. Other: None IMPRESSION: 1. No acute intracranial abnormality. 2. Extensive white matter disease and signs of atrophy as before. 3. No evidence for acute fracture or subluxation of the cervical spine. 4. Multilevel degenerative changes of the cervical spine greatest at C5-6 and C6-7 with left greater than right neural foraminal narrowing. 5. Small pulmonary nodules in the lung apices. No follow-up needed if patient is low-risk (and has no known or suspected primary neoplasm). Non-contrast chest CT can be considered in 12 months if patient is high-risk. This recommendation follows the consensus statement: Guidelines for Management of Incidental Pulmonary Nodules Detected on CT Images: From the Fleischner Society 2017; Radiology 2017; 284:228-243. Electronically Signed   By: Zetta Bills M.D.   On: 08/06/2019 13:57   DG Chest Portable 1 View  Result Date: 08/06/2019 CLINICAL DATA:  Syncope EXAM: PORTABLE CHEST 1 VIEW COMPARISON:  07/30/2019 FINDINGS: The heart size and mediastinal contours are within normal limits. No focal airspace consolidation, pleural effusion, or pneumothorax. The visualized skeletal structures are  unremarkable. IMPRESSION: No active disease. Electronically Signed   By: Davina Poke D.O.   On: 08/06/2019 13:14    Procedures Procedures (including critical care time)  Medications Ordered in ED Medications - No data to display  ED Course  I have reviewed the triage vital signs and the nursing notes.  Pertinent labs & imaging results that were available during my care of the patient were reviewed by me and considered in my medical decision making (see chart for details).  Clinical Course as of Aug 05 1540  Sun Aug 06, 2019  1251 Elderly female from ALF presenting with syncopal episode. Hx of same in the past of unknown etiology. Patient reports feeling unwell but no specific complaints. Does not remember syncopal episodes. Well appearing with normal vitals and is not orthostatic. Had admission last week for the same with reassuring EEG, carotid artery ultrasounds and echo. CTA without PE.    [KM]  1541 Work-up is reassuring including labs, troponin, chest x-ray, head CT scan.  Discussed with Dr. Zenia Resides.  Will likely need to admit patient again for cardiac monitoring and possible discharge on Holter monitor given her recurrent syncope   [KM]    Clinical Course User Index [KM] Kristine Royal   MDM Rules/Calculators/A&P                      The patient appears reasonably stabilized for admission considering the current resources, flow, and capabilities available in the ED at this time, and I doubt any other [facility] requiring further screening and/or treatment in the ED prior to admission.  Final Clinical Impression(s) / ED Diagnoses Final diagnoses:  Recurrent syncope    Rx / DC Orders ED Discharge Orders    None       Kristine Royal 08/07/19 1523    Lacretia Leigh, MD 08/08/19 [phone]

## 2019-08-06 NOTE — ED Notes (Signed)
RN notified pt daughter of pt new room number.

## 2019-08-06 NOTE — H&P (Signed)
History and Physical    Joy Ward SD:[id number] DOB: 30-Apr-1940 DOA: 08/06/2019  PCP: Gayland Curry, DO Consultants:  None [facility]; NOK: Daughter, Cleatis Polka, [phone]  Chief Complaint: recurrent syncope  HPI: Joy Ward is a 79 y.o. female with medical history significant of CVA; dementia; hypothyroidism; HTN; HLD; DM; and chronic back pain presenting with recurrent syncope.  She was hospitalized from 4/4-6 for syncope.  She is fairly anxious and can't remember exactly what happened.  She didn't feel well yesterday and this AM asked for breakfast in her room.  The staff told her she had to get up and go to breakfast.  She went and ate little (it wasn't very good) and went back to her room.  The next thing she remembers she was in a hospital bed.  Reportedly, the staff heard a thud and found her unconscious.  She feels ok now but is very concerned about what happened and frustrated about getting older and not remembering well.  She reports that she thinks she has had a UTI for months now but she can't describe any particular symptoms that make her think this or than maybe polyuria.  Her daughter reports that they found her in the bathroom and was drenched in sweat and they tried to lift her and she was dead weight.  Her daughter reported that she felt "weak as water" yesterday.  She hasn't been eating like she should and isn't drinking as much water as she needs to.  Prior to her hospitalization last week, she was seen in the ER on 3/28 for dizziness with standing.  She was diagnosed with a UTI and treated with antibiotics.  MRI was negative.  She has had recurrent UTIs with pansensitive E coli - culture + on 3/28, 2/21, 1/3.   ED Course:  Admitted last week for the same - syncope.  ALF staff heard a thud, found her unconscious for seconds.  Now at baseline, does not remember event.  Benign evaluation thus far, EKG with old 1st degree heart block.   Had EEG, dopplers, echo. ?arrythmia.  Review of Systems: As per HPI; otherwise review of systems reviewed and negative.    Past Medical History:  Diagnosis Date  . Allergy   . Arthritis   . Chronic back pain   . DDD (degenerative disc disease), lumbar   . Diabetes mellitus without complication ([facility])   . Diabetes type 2, uncontrolled ([facility]) 09/18/2006   Annotation: Noninsulin dependent Qualifier: Diagnosis of  By: Amil Amen MD, Benjamine Mola    . Former tobacco use   . Hyperlipidemia    a. H/o muscle aches with statins per PCP notes.  . Hypertension   . Hypothyroidism    a. prior thyroid removal.  . Memory loss or impairment 03/29/2019  . Stroke [facility])    a. Listed in PCP notes: "History of stroke:  History of terrible headache.  Had a CT scan of brain and found "ministroke"  Was removed from ERT subsequently. "    Past Surgical History:  Procedure Laterality Date  . ABDOMINAL HYSTERECTOMY  1994   TAH, not clear if unilateral oophorectomy as well.  Marland Kitchen [place]  . CHOLECYSTECTOMY  1973   open  . DECOMPRESSIVE LUMBAR LAMINECTOMY LEVEL 4  2005   In [place]  . THYROIDECTOMY  2012   multinodular goiter    Social History   Socioeconomic History  . Marital status: Single    Spouse name:  Not on file  . Number of children: 7  . Years of education: 43  . Highest education level: Not on file  Occupational History  . Occupation: pastor--travels a lot.  Tobacco Use  . Smoking status: Former Research scientist (life sciences)  . Smokeless tobacco: Never Used  . Tobacco comment: No smoking since 79 yo - smoked 10 yrs  Substance and Sexual Activity  . Alcohol use: No  . Drug use: No  . Sexual activity: Not on file  Other Topics Concern  . Not on file  Social History Narrative   Lives alone in [place].   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   Social Determinants of Health   Financial Resource Strain:   . Difficulty of Paying  Living Expenses:   Food Insecurity:   . Worried About Charity fundraiser in the Last Year:   . Arboriculturist in the Last Year:   Transportation Needs:   . Film/video editor (Medical):   Marland Kitchen Lack of Transportation (Non-Medical):   Physical Activity:   . Days of Exercise per Week:   . Minutes of Exercise per Session:   Stress:   . Feeling of Stress :   Social Connections:   . Frequency of Communication with Friends and Family:   . Frequency of Social Gatherings with Friends and Family:   . Attends Religious Services:   . Active Member of Clubs or Organizations:   . Attends Archivist Meetings:   Marland Kitchen Marital Status:   Intimate Partner Violence:   . Fear of Current or Ex-Partner:   . Emotionally Abused:   Marland Kitchen Physically Abused:   . Sexually Abused:     Allergies  Allergen Reactions  . Clindamycin Hcl Other (See Comments)    REACTION: C. difficile colitis  . Sulfamethoxazole-Trimethoprim Other (See Comments)     Eye redness  . Zanaflex [Tizanidine Hcl] Anaphylaxis  . Metronidazole Hives  . Statins Other (See Comments)    Side Effect: muscle pain Has tried Zocor, Crestor--she does not want to take a statin, period  . Bee Venom Other (See Comments)    Unknown reaction  . Ace Inhibitors Cough  . Adhesive [Tape] Itching, Rash and Other (See Comments)    Blistering of skin     Family History  Problem Relation Age of Onset  . Heart disease Mother        unclear details "atherosclerosis"  . Diabetes Mother   . Alcohol abuse Sister   . Diabetes Brother   . Diabetes Brother   . Drug abuse Brother   . Diabetes Daughter   . Heart disease Son        CHF  . Alcohol abuse Son     Prior to Admission medications   Medication Sig Start Date End Date Taking? Authorizing Provider  acetaminophen (TYLENOL) 500 MG tablet Take 500 mg by mouth every 6 (six) hours as needed (fever 99.5-101, minor headache, minor discomfort).   Yes [provider]  amLODipine  (NORVASC) 5 MG tablet Take 1 tablet (5 mg total) by mouth daily. 08/02/19  Yes Reed, Tiffany L, DO  bisacodyl (DULCOLAX) 10 MG suppository Place 1 suppository (10 mg total) rectally daily as needed for moderate constipation. 06/28/19  Yes Pokhrel, Laxman, MD  donepezil (ARICEPT) 5 MG tablet Take 1 tablet (5 mg total) by mouth at bedtime. 08/02/19  Yes Reed, Tiffany L, DO  furosemide (LASIX) 20 MG  tablet Take 20 mg by mouth daily.   Yes [provider]  HYDROcodone-acetaminophen (NORCO) 10-325 MG tablet Take 1 tablet by mouth every 6 (six) hours as needed for moderate pain.    Yes [provider]  levothyroxine (SYNTHROID) 100 MCG tablet Take 1 tablet (100 mcg total) by mouth daily. 08/02/19 08/01/20 Yes Reed, Tiffany L, DO  loperamide (IMODIUM) 2 MG capsule Take 2 mg by mouth as needed for diarrhea or loose stools (do not exceed 8 dsoses in 24 hours).   Yes [provider]  losartan (COZAAR) 100 MG tablet Take 100 mg by mouth daily.   Yes [provider]  magnesium hydroxide (MILK OF MAGNESIA) 400 MG/5ML suspension Take 30 mLs by mouth at bedtime as needed (constipation). 08/01/19  Yes Matcha, Anupama, MD  Omega-3 Fatty Acids (FISH OIL) 1000 MG CAPS Take 1,000 mg by mouth daily.    Yes [provider]  polyethylene glycol (MIRALAX / GLYCOLAX) 17 g packet Take 17 g by mouth daily. 08/01/19  Yes Matcha, Anupama, MD  pravastatin (PRAVACHOL) 20 MG tablet Take 1 tablet (20 mg total) by mouth daily at 6 PM. 08/02/19  Yes Reed, Tiffany L, DO  QUEtiapine (SEROQUEL) 100 MG tablet Take 100 mg by mouth at bedtime.   Yes [provider]  alum & mag hydroxide-simeth (MI-ACID) 200-200-20 MG/5ML suspension Take 30 mLs by mouth every 6 (six) hours as needed for indigestion or heartburn. Patient not taking: Reported on 08/06/2019 08/01/19   Matcha, Beverely Pace, MD  bimatoprost (LUMIGAN) 0.01 % SOLN Place 1 drop into the left eye at bedtime. Patient not taking: Reported on 07/23/2019  03/04/18   Mack Hook, MD  diclofenac sodium (VOLTAREN) 1 % GEL Apply 4 g topically 2 (two) times daily as needed (pain). Patient not taking: Reported on 07/23/2019 03/04/18   Mack Hook, MD  lidocaine (LIDODERM) 5 % Place 1 patch onto the skin every 12 (twelve) hours. Remove & Discard patch within 12 hours or as directed by MD. At the site of back pain Patient not taking: Reported on 07/23/2019 06/28/19 06/27/20  Flora Lipps, MD  metFORMIN (GLUCOPHAGE-XR) 500 MG 24 hr tablet Take 1 tablet (500 mg total) by mouth daily with breakfast. Patient not taking: Reported on 08/06/2019 08/02/19   Hollace Kinnier L, DO  metoprolol tartrate (LOPRESSOR) 25 MG tablet Take 0.5 tablets (12.5 mg total) by mouth 2 (two) times daily. Patient not taking: Reported on 08/06/2019 08/01/19 07/31/20  Yaakov Guthrie, MD  QUEtiapine (SEROQUEL) 100 MG tablet Take 1 tablet (100 mg total) by mouth at bedtime. Patient not taking: Reported on 08/06/2019 08/02/19   Gayland Curry, DO    Physical Exam: Vitals:   08/06/19 1500 08/06/19 1515 08/06/19 1600 08/06/19 1615  BP: 126/68 127/71 134/75 104/85  Pulse: 69 73 70 73  Resp: 15 20 20 20   Temp:      TempSrc:      SpO2: 99% 96% 100% 100%     . General:  Appears calm and comfortable and is NAD . Eyes:  PERRL, EOMI, normal lids, iris . ENT:  grossly normal hearing, lips & tongue, mmm; appropriate dentition . Neck:  no LAD, masses or thyromegaly . Cardiovascular:  RRR, no m/r/g.   . Respiratory:   CTA bilaterally with no wheezes/rales/rhonchi.  Normal respiratory effort. . Abdomen:  soft, NT, ND, NABS . Skin:  no rash or induration seen on limited exam . Musculoskeletal:  grossly normal tone BUE/BLE, good ROM, no bony abnormality .  Lower extremity:  No LE edema.  Limited foot exam with no ulcerations.  2+ distal pulses. Marland Kitchen Psychiatric:  grossly normal mood and affect, speech fluent and appropriate, AOx2 but forgets things like where she is and time and has to ask  again . Neurologic:  CN 2-12 grossly intact, moves all extremities in coordinated fashion    Radiological Exams on Admission: CT Head Wo Contrast  Result Date: 08/06/2019 CLINICAL DATA:  Syncope, recurrent EXAM: CT HEAD WITHOUT CONTRAST CT CERVICAL SPINE WITHOUT CONTRAST TECHNIQUE: Multidetector CT imaging of the head and cervical spine was performed following the standard protocol without intravenous contrast. Multiplanar CT image reconstructions of the cervical spine were also generated. COMPARISON:  07/30/2019 FINDINGS: CT HEAD FINDINGS Brain: No evidence of acute infarction, hemorrhage, hydrocephalus, extra-axial collection or mass lesion/mass effect. Extensive white matter disease and signs of atrophy as before. Remote cerebellar infarct better demonstrated on previous MRIs. Vascular: No hyperdense vessel or unexpected calcification. Skull: Normal. Negative for fracture or focal lesion. Sinuses/Orbits: Mucous retention cyst or polyp in the left maxillary sinus. Similar to prior study. Other: None CT CERVICAL SPINE FINDINGS Alignment: Normal. Skull base and vertebrae: No acute fracture. No primary bone lesion or focal pathologic process. Soft tissues and spinal canal: No prevertebral fluid or swelling. No visible canal hematoma. Signs of thyroidectomy incidentally noted. Disc levels: Multilevel degenerative changes greatest at C5-6 and C6-7 with uncovertebral degenerative changes at these levels worse at C6-7 and greatest on the left with neural foraminal narrowing as result. Facet arthropathy is greatest also on the left at at C4-5 with degenerative ankylosis of facets at this level. Upper chest: 4 mm right upper lobe nodule. 3 mm left upper lobe nodule. Other: None IMPRESSION: 1. No acute intracranial abnormality. 2. Extensive white matter disease and signs of atrophy as before. 3. No evidence for acute fracture or subluxation of the cervical spine. 4. Multilevel degenerative changes of the cervical  spine greatest at C5-6 and C6-7 with left greater than right neural foraminal narrowing. 5. Small pulmonary nodules in the lung apices. No follow-up needed if patient is low-risk (and has no known or suspected primary neoplasm). Non-contrast chest CT can be considered in 12 months if patient is high-risk. This recommendation follows the consensus statement: Guidelines for Management of Incidental Pulmonary Nodules Detected on CT Images: From the Fleischner Society 2017; Radiology 2017; 284:228-243. Electronically Signed   By: Zetta Bills M.D.   On: 08/06/2019 13:57   CT Cervical Spine Wo Contrast  Result Date: 08/06/2019 CLINICAL DATA:  Syncope, recurrent EXAM: CT HEAD WITHOUT CONTRAST CT CERVICAL SPINE WITHOUT CONTRAST TECHNIQUE: Multidetector CT imaging of the head and cervical spine was performed following the standard protocol without intravenous contrast. Multiplanar CT image reconstructions of the cervical spine were also generated. COMPARISON:  07/30/2019 FINDINGS: CT HEAD FINDINGS Brain: No evidence of acute infarction, hemorrhage, hydrocephalus, extra-axial collection or mass lesion/mass effect. Extensive white matter disease and signs of atrophy as before. Remote cerebellar infarct better demonstrated on previous MRIs. Vascular: No hyperdense vessel or unexpected calcification. Skull: Normal. Negative for fracture or focal lesion. Sinuses/Orbits: Mucous retention cyst or polyp in the left maxillary sinus. Similar to prior study. Other: None CT CERVICAL SPINE FINDINGS Alignment: Normal. Skull base and vertebrae: No acute fracture. No primary bone lesion or focal pathologic process. Soft tissues and spinal canal: No prevertebral fluid or swelling. No visible canal hematoma. Signs of thyroidectomy incidentally noted. Disc levels: Multilevel degenerative changes greatest at C5-6 and C6-7 with uncovertebral  degenerative changes at these levels worse at C6-7 and greatest on the left with neural foraminal  narrowing as result. Facet arthropathy is greatest also on the left at at C4-5 with degenerative ankylosis of facets at this level. Upper chest: 4 mm right upper lobe nodule. 3 mm left upper lobe nodule. Other: None IMPRESSION: 1. No acute intracranial abnormality. 2. Extensive white matter disease and signs of atrophy as before. 3. No evidence for acute fracture or subluxation of the cervical spine. 4. Multilevel degenerative changes of the cervical spine greatest at C5-6 and C6-7 with left greater than right neural foraminal narrowing. 5. Small pulmonary nodules in the lung apices. No follow-up needed if patient is low-risk (and has no known or suspected primary neoplasm). Non-contrast chest CT can be considered in 12 months if patient is high-risk. This recommendation follows the consensus statement: Guidelines for Management of Incidental Pulmonary Nodules Detected on CT Images: From the Fleischner Society 2017; Radiology 2017; 284:228-243. Electronically Signed   By: Zetta Bills M.D.   On: 08/06/2019 13:57   DG Chest Portable 1 View  Result Date: 08/06/2019 CLINICAL DATA:  Syncope EXAM: PORTABLE CHEST 1 VIEW COMPARISON:  07/30/2019 FINDINGS: The heart size and mediastinal contours are within normal limits. No focal airspace consolidation, pleural effusion, or pneumothorax. The visualized skeletal structures are unremarkable. IMPRESSION: No active disease. Electronically Signed   By: Davina Poke D.O.   On: 08/06/2019 13:14    EKG: Independently reviewed.  NSR with rate 61; nonspecific ST changes with no evidence of acute ischemia   Labs on Admission: I have personally reviewed the available labs and imaging studies at the time of the admission.  Pertinent labs:   Glucose 125 HS troponin 4, 4 Normal CBC   Assessment/Plan Principal Problem:   Syncope Active Problems:   HYPERLIPIDEMIA, MIXED   Essential hypertension   Vascular dementia with behavior disturbance (HCC)    Hypothyroidism   Diabetes mellitus ([place])   Recurrent UTI   Syncope -She was previously admitted a week ago for the same issue -Etiology is not clear. The differential diagnosis is broad, including vasovagal syncope, seizure, TIA/stroke, arrhythmia, ACS, orthostatic status, carotid artery stenosis -During her last hospitalization, she had unremarkable Echo, carotids, and EEG -The patient is concerned that this is due to a UTI; that was negative during last hospitalization but will check UA and urine culture -Will monitor on telemetry overnight in observation for now -Orthostatic vital signs now and in AM -Troponin negative x 2 -Neuro checks  -Negative MRI on 3/28; no indication for repeat  -PT/OT eval and treat -This appears likely to be related to her chronic dementia and so if this is the case there will be little that can be done to prevent future episodes other than close monitoring at her ALF  Recurrent UTI -As noted above, she has had 3 culture-proven E coli UTIs so far this year -UA and culture are pending -UTI is an unusual cause of syncope but is a possible reason  Dementia -Continue Aricept and Seroquel -Daughter has concern about insomnia and requests an additional sleep aid; will add Trazodone  Hypothyroidism -recent thyroid testing was quite abnormal and indicated that she needs a higher dose of Synthroid -Synthroid dose was increased from 75 to 100 mcg during last hospitalization -Repeat thyroid testing in another 3-5 weeks  Pulmonary nodules -Incidentally seen on CT today -Consider 1-year f/u  HTN -Mildly low BP now so will hold home Norvasc and Cozaar  HLD -Continue  Pravachol  DM -Prior A1c was 6.7 -She was previously on Metformin but this is not on her current medication list -Will cover with sensitive-scale SSI if needed  Chronic back pain -She takes Norco as needed -This could also be increasing her risk for falls and should be used  judiciously   Note: This patient has been tested and is pending for the novel coronavirus COVID-19.  DVT prophylaxis:   Lovenox  Code Status:  DNR - confirmed with patient/family Family Communication: None present; I spoke with the patient's daughter by telephone at the time of admission. Disposition Plan:  The patient is from: ALF  Anticipated d/c is to: ALF - but possibly a different facility eventually  Anticipated d/c date will depend on clinical response to treatment, but possibly as early as tomorrow if she has excellent response to treatment  Patient is currently: medically stable Consults called: PT/OT Admission status:  It is my clinical opinion that referral for OBSERVATION is reasonable and necessary in this patient based on the above information provided. The aforementioned taken together are felt to place the patient at high risk for further clinical deterioration. However it is anticipated that the patient may be medically stable for discharge from the hospital within 24 to 48 hours.    Karmen Bongo MD [facility]   How to contact the [facility] Attending or Consulting provider [place] or covering provider during after hours [place], for this patient?  1. Check the care team in [facility] and look for a) attending/consulting [facility] provider listed and b) the [facility] team listed 2. Log into [email or web address] and use [facility]'s universal password to access. If you do not have the password, please contact the hospital operator. 3. Locate the [facility] provider you are looking for under [facility] and page to a number that you can be directly reached. 4. If you still have difficulty reaching the provider, please page the [facility] (Director on Call) for the Hospitalists listed on amion for assistance.   08/06/2019, 5:49 PM

## 2019-08-06 NOTE — ED Provider Notes (Signed)
Medical screening examination/treatment/procedure(s) were conducted as a shared visit with non-physician practitioner(s) and myself.  I personally evaluated the patient during the encounter.  EKG Interpretation  Date/Time:  Sunday August 06 2019 11:44:07 EDT Ventricular Rate:  61 PR Interval:    QRS Duration: 95 QT Interval:  432 QTC Calculation: 436 R Axis:   41 Text Interpretation: Sinus rhythm Prolonged PR interval Borderline T abnormalities, diffuse leads Confirmed by Lacretia Leigh ([id number]) on 08/06/2019 12:61:53 PM 79 year old female presents after having a witnessed syncopal episode at home.  Patient had recent mission for same and had work-up that that did not show an etiology.  Work-up here today is negative so far.  Suspect possible arrhythmia as cause of her symptoms.  Will consult hospitalist for admission   Lacretia Leigh, MD 08/06/19 1546

## 2019-08-06 NOTE — ED Notes (Signed)
Pt requesting pain medication and states her pain in unbearable compared to what it normally is at home. RN explained the benefit of having her PRN dose of Morphine to decrease the pain so she can make her pain more manageable with PO medication. Pt verbalized understanding and wishes to continue with this plan.

## 2019-08-07 ENCOUNTER — Encounter: Payer: Self-pay | Admitting: Internal Medicine

## 2019-08-07 DIAGNOSIS — I10 Essential (primary) hypertension: Secondary | ICD-10-CM | POA: Diagnosis not present

## 2019-08-07 DIAGNOSIS — E1169 Type 2 diabetes mellitus with other specified complication: Secondary | ICD-10-CM | POA: Diagnosis not present

## 2019-08-07 DIAGNOSIS — Z7401 Bed confinement status: Secondary | ICD-10-CM | POA: Diagnosis not present

## 2019-08-07 DIAGNOSIS — M2550 Pain in unspecified joint: Secondary | ICD-10-CM | POA: Diagnosis not present

## 2019-08-07 DIAGNOSIS — R55 Syncope and collapse: Secondary | ICD-10-CM | POA: Diagnosis not present

## 2019-08-07 LAB — POTASSIUM: Potassium: 4.6 mmol/L (ref 3.5–5.1)

## 2019-08-07 LAB — BASIC METABOLIC PANEL
Anion gap: 10 (ref 5–15)
BUN: 8 mg/dL (ref 8–23)
CO2: 24 mmol/L (ref 22–32)
Calcium: 9.3 mg/dL (ref 8.9–10.3)
Chloride: 106 mmol/L (ref 98–111)
Creatinine, Ser: 0.71 mg/dL (ref 0.44–1.00)
GFR calc Af Amer: >60 mL/min (ref >60)
GFR calc non Af Amer: >60 mL/min (ref >60)
Glucose, Bld: 122 mg/dL — ABNORMAL HIGH (ref 70–99)
Potassium: 2.9 mmol/L — ABNORMAL LOW (ref 3.5–5.1)
Sodium: 140 mmol/L (ref 135–145)

## 2019-08-07 LAB — GLUCOSE, CAPILLARY
Glucose-Capillary: 104 mg/dL — ABNORMAL HIGH (ref 70–99)
Glucose-Capillary: 181 mg/dL — ABNORMAL HIGH (ref 70–99)
Glucose-Capillary: 123 mg/dL — ABNORMAL HIGH (ref 70–99)

## 2019-08-07 LAB — CBC
HCT: 33.5 % — ABNORMAL LOW (ref 36.0–46.0)
Hemoglobin: 11.1 g/dL — ABNORMAL LOW (ref 12.0–15.0)
MCH: 29.4 pg (ref 26.0–34.0)
MCHC: 33.1 g/dL (ref 30.0–36.0)
MCV: 88.9 fL (ref 80.0–100.0)
Platelets: 224 10*3/uL (ref 150–400)
RBC: 3.77 MIL/uL — ABNORMAL LOW (ref 3.87–5.11)
RDW: 13.9 % (ref 11.5–15.5)
WBC: 5.4 10*3/uL (ref 4.0–10.5)
nRBC: 0 % (ref 0.0–0.2)

## 2019-08-07 LAB — MAGNESIUM: Magnesium: 1.7 mg/dL (ref 1.7–2.4)

## 2019-08-07 LAB — MRSA PCR SCREENING: MRSA by PCR: NEGATIVE

## 2019-08-07 MED ORDER — POTASSIUM CHLORIDE CRYS ER 20 MEQ PO TBCR
40.0000 meq | EXTENDED_RELEASE_TABLET | ORAL | Status: AC
  Administered 2019-08-07: 40 meq via ORAL
  Filled 2019-08-07: qty 2

## 2019-08-07 MED ORDER — POTASSIUM CHLORIDE 10 MEQ/100ML IV SOLN
10.0000 meq | INTRAVENOUS | Status: AC
  Administered 2019-08-07 (×4): 10 meq via INTRAVENOUS
  Filled 2019-08-07 (×4): qty 100

## 2019-08-07 MED ORDER — DICLOFENAC SODIUM 1 % EX GEL
2.0000 g | Freq: Three times a day (TID) | CUTANEOUS | Status: DC
  Administered 2019-08-07 (×3): 2 g via TOPICAL
  Filled 2019-08-07: qty 100

## 2019-08-07 MED ORDER — MAGNESIUM SULFATE IN D5W 1-5 GM/100ML-% IV SOLN
1.0000 g | Freq: Once | INTRAVENOUS | Status: AC
  Administered 2019-08-07: 1 g via INTRAVENOUS
  Filled 2019-08-07 (×2): qty 100

## 2019-08-07 NOTE — TOC Initial Note (Addendum)
Transition of Care [facility]) - Initial/Assessment Note    Patient Details  Name: Joy Ward MRN: [id number] Date of Birth: 1940-08-16  Transition of Care [facility]) CM/SW Contact:    Benard Halsted, LCSW Phone Number: 08/07/2019, 1:13 PM  Clinical Narrative:                 CSW confirmed that [facility] is able to accept patient back today and only requests her dc summary. CSW will follow up.   Expected Discharge Plan: Assisted Living Barriers to Discharge: No Barriers Identified   Patient Goals and CMS Choice Patient states their goals for this hospitalization and ongoing recovery are:: Return to ALF CMS Medicare.gov Compare Post Acute Care list provided to:: Patient Choice offered to / list presented to : Patient, Adult Children  Expected Discharge Plan and Services Expected Discharge Plan: Assisted Living In-house Referral: Clinical Social Work     Living arrangements for the past 2 months: [place]                                      Prior Living Arrangements/Services Living arrangements for the past 2 months: [place] Lives with:: Facility Resident Patient language and need for interpreter reviewed:: Yes Do you feel safe going back to the place where you live?: Yes      Need for Family Participation in Patient Care: Yes (Comment) Care giver support system in place?: Yes (comment) Current home services: DME Criminal Activity/Legal Involvement Pertinent to Current Situation/Hospitalization: No - Comment as needed  Activities of Daily Living      Permission Sought/Granted Permission sought to share information with : Facility Sport and exercise psychologist, Family Supports Permission granted to share information with : Yes, Verbal Permission Granted  Share Information with NAME: [name]  Permission granted to share info w AGENCY: [place] granted to share info w Relationship: Daughter  Permission granted to share  info w Contact Information: [name] [phone]  Emotional Assessment Appearance:: Appears stated age Attitude/Demeanor/Rapport: (Appropriate) Affect (typically observed): Appropriate Orientation: : Oriented to Self, Oriented to Place, Oriented to  Time, Oriented to Situation Alcohol / Substance Use: Not Applicable Psych Involvement: No (comment)  Admission diagnosis:  Syncope [R55] Recurrent syncope [R55] Patient Active Problem List   Diagnosis Date Noted  . Lower GI bleed 07/31/2019  . Syncope 07/30/2019  . Pancytopenia ([place]) 07/30/2019  . Hyperchloremia 07/30/2019  . Metabolic acidosis [id number]  . Hypocalcemia 07/30/2019  . Palliative care patient 07/03/2019  . Hallucinations 06/24/2019  . Metabolic encephalopathy [id number]  . Altered mental status   . Recurrent UTI 04/30/2019  . Vascular dementia with behavior disturbance ([place]) 04/14/2019  . Primary insomnia 04/14/2019  . Hypothyroidism 04/14/2019  . Diabetes mellitus ([place]) 04/14/2019  . Memory loss or impairment 03/29/2019  . Diabetic peripheral neuropathy associated with type 2 diabetes mellitus ([place]) 12/29/2017  . Peripheral edema 12/29/2017  . Elevated liver enzymes 10/15/2016  . Chest pain 04/22/2016  . Systolic murmur [id number]  . Hypercalcemia 04/22/2016  . Hyperlipidemia 04/22/2016  . Baker's cyst of knee, right 04/21/2016  . HYPOKALEMIA 01/24/2010  . CONSTIPATION 12/23/2009  . INTERMITTENT VERTIGO 12/23/2009  . SINUSITIS, ACUTE 04/25/2009  . VAGINITIS, CANDIDAL 02/14/2009  . HIP PAIN, RIGHT 02/14/2009  . HYPERLIPIDEMIA, MIXED 01/01/2009  . BACK PAIN, LUMBAR, WITH RADICULOPATHY 12/25/2008  . KNEE PAIN, BILATERAL 12/18/2008  . TROCHANTERIC BURSITIS, BILATERAL 12/18/2008  . Disturbance  in sleep behavior 12/18/2008  . ONYCHOMYCOSIS, TOENAILS 10/30/2008  . ENTERITIS, CLOSTRIDIUM DIFFICILE 10/15/2006  . Diabetes type 2, uncontrolled ([place]) 09/18/2006  . Essential hypertension 09/18/2006  . Allergic  rhinitis 09/18/2006  . GOITER, MULTINODULAR 06/29/2006  . STROKE 04/27/2001   PCP:  Gayland Curry, DO Pharmacy:   [place], [place] - [address] [place] [place] [address] Phone: [phone] Fax: [phone]     Social Determinants of Health (SDOH) Interventions    Readmission Risk Interventions No flowsheet data found.

## 2019-08-07 NOTE — Evaluation (Signed)
Occupational Therapy Evaluation and Discharge Patient Details Name: Joy Ward MRN: [id number] DOB: [birth year] Today's Date: 08/07/2019    History of Present Illness 79 y.o. female with medical history significant of dementia, CVA, type 2 diabetes, hypertension, hyperlipidemia, hypothyroidism presenting to the ED from her memory care facility for evaluation of syncope.    Clinical Impression   Pt overall functioning at a supervision level due to h/o syncope. No further OT needs. Recommend return to memory care facility.    Follow Up Recommendations  No OT follow up    Equipment Recommendations  None recommended by OT    Recommendations for Other Services       Precautions / Restrictions        Mobility Bed Mobility Overal bed mobility: Modified Independent             General bed mobility comments: HOB up  Transfers Overall transfer level: Needs assistance Equipment used: None Transfers: Sit to/from Stand Sit to Stand: Supervision              Balance Overall balance assessment: Needs assistance   Sitting balance-Leahy Scale: Normal       Standing balance-Leahy Scale: Good                             ADL either performed or assessed with clinical judgement   ADL                                         General ADL Comments: Pt overall functioning at a supervision level due to syncope for reason of admission.     Vision Patient Visual Report: No change from baseline       Perception     Praxis      Pertinent Vitals/Pain Pain Assessment: No/denies pain     Hand Dominance Right   Extremity/Trunk Assessment Upper Extremity Assessment Upper Extremity Assessment: Overall WFL for tasks assessed   Lower Extremity Assessment Lower Extremity Assessment: Defer to PT evaluation   Cervical / Trunk Assessment Cervical / Trunk Assessment: Other exceptions Cervical / Trunk Exceptions: hx of back sx    Communication Communication Communication: No difficulties   Cognition Arousal/Alertness: Awake/alert Behavior During Therapy: WFL for tasks assessed/performed Overall Cognitive Status: History of cognitive impairments - at baseline                                     General Comments       Exercises     Shoulder Instructions      Home Living Family/patient expects to be discharged to:: Assisted living                             Home Equipment: None          Prior Functioning/Environment Level of Independence: Needs assistance  Gait / Transfers Assistance Needed: ambulates independently ADL's / Homemaking Assistance Needed: assisted for IADL, independent in ADL            OT Problem List:        OT Treatment/Interventions:      OT Goals(Current goals can be found in the care plan section) Acute Rehab OT Goals Patient Stated Goal: return to memory  care facility  OT Frequency:     Barriers to D/C:            Co-evaluation              AM-PAC OT "6 Clicks" Daily Activity     Outcome Measure Help from another person eating meals?: None Help from another person taking care of personal grooming?: A Little Help from another person toileting, which includes using toliet, bedpan, or urinal?: A Little Help from another person bathing (including washing, rinsing, drying)?: A Little Help from another person to put on and taking off regular upper body clothing?: None Help from another person to put on and taking off regular lower body clothing?: A Little 6 Click Score: 20   End of Session    Activity Tolerance: Patient tolerated treatment well Patient left: in chair;with call bell/phone within reach;with nursing/sitter in room;with chair alarm set  OT Visit Diagnosis: Other symptoms and signs involving cognitive function                Time: 1400-1450 OT Time Calculation (min): 50 min Charges:  OT General Charges $OT Visit:  1 Visit OT Evaluation $OT Eval Moderate Complexity: 1 Mod OT Treatments $Self Care/Home Management : 23-37 mins  Nestor Lewandowsky, OTR/L Acute Rehabilitation Services Pager: [phone] Office: [phone]  Malka So 08/07/2019, 3:00 PM

## 2019-08-07 NOTE — TOC Progression Note (Signed)
Transition of Care [facility]) - Progression Note    Patient Details  Name: Abygail Gangloff MRN: [id number] Date of Birth: 10-21-1940  Transition of Care [facility]) CM/SW [place], LCSW Phone Number: 08/07/2019, 4:58 PM  Clinical Narrative:    CSW  Updated patient's daughter of discharge. She requests PTAR for transportation. CSW confirmed receipt of DC Summary by [facility].    Expected Discharge Plan: Assisted Living Barriers to Discharge: No Barriers Identified  Expected Discharge Plan and Services Expected Discharge Plan: Assisted Living In-house Referral: Clinical Social Work     Living arrangements for the past 2 months: [place] Expected Discharge Date: 08/07/19                                     Social Determinants of Health (SDOH) Interventions    Readmission Risk Interventions No flowsheet data found.

## 2019-08-07 NOTE — Progress Notes (Signed)
Patient was discharged to [facility] by MD order; discharged instructions review and sent to facility with care notes; IV DIC; skin intact;  Facility was called and report was given to the nurse who is going to receive the patient;  patient will be transported to facility via [place]. Patient's daughter aware of D/C.

## 2019-08-07 NOTE — Evaluation (Signed)
Physical Therapy Evaluation Patient Details Name: Joy Ward MRN: [id number] DOB: 1941-03-05 Today's Date: 08/07/2019   History of Present Illness  79 y.o. female with medical history significant of dementia, CVA, type 2 diabetes, hypertension, hyperlipidemia, hypothyroidism presenting to the ED from her memory care facility for evaluation of syncope.  Orthostatic were negative.  Clinical Impression  Pt admitted with above diagnosis. Pt presenting with fatigue that limited mobility today.  She was able to ambulate in room with mild unsteadiness. Required cues for safety and transfer technique.  No episodes of syncopal symptoms  Pt currently with functional limitations due to the deficits listed below (see PT Problem List). Pt will benefit from skilled PT to increase their independence and safety with mobility to allow discharge to the venue listed below.       Follow Up Recommendations Other (comment)(return to ALF)    Equipment Recommendations  None recommended by PT    Recommendations for Other Services       Precautions / Restrictions Precautions Precautions: Fall      Mobility  Bed Mobility Overal bed mobility: Modified Independent Bed Mobility: Sit to Supine       Sit to supine: Supervision   General bed mobility comments: HOB up  Transfers Overall transfer level: Needs assistance Equipment used: None Transfers: Sit to/from Stand Sit to Stand: Min guard         General transfer comment: min guard for safety  Ambulation/Gait Ambulation/Gait assistance: Min guard Gait Distance (Feet): 30 Feet Assistive device: 1 person hand held assist Gait Pattern/deviations: Step-through pattern;Decreased stride length Gait velocity: decreased   General Gait Details: min guard for safety; cautious slow gait with mild instability; pt reaching for wall at times  Stairs            Wheelchair Mobility    Modified Rankin (Stroke Patients Only)       Balance  Overall balance assessment: Needs assistance Sitting-balance support: Feet supported Sitting balance-Leahy Scale: Normal     Standing balance support: During functional activity;Single extremity supported Standing balance-Leahy Scale: Good                               Pertinent Vitals/Pain Pain Assessment: Faces Faces Pain Scale: Hurts a little bit Pain Location: legs sore Pain Descriptors / Indicators: Sore Pain Intervention(s): Limited activity within patient's tolerance;Relaxation RN notified and giving meds    Home Living Family/patient expects to be discharged to:: Assisted living               Home Equipment: None      Prior Function Level of Independence: Needs assistance   Gait / Transfers Assistance Needed: ambulates independently without AD  ADL's / Homemaking Assistance Needed: assisted for IADL, independent in ADL  Comments: not sure of accuracy of all hx info given  but pt knows she now lives at assisted living and has been there few months     Hand Dominance   Dominant Hand: Right    Extremity/Trunk Assessment   Upper Extremity Assessment Upper Extremity Assessment: Defer to OT evaluation    Lower Extremity Assessment Lower Extremity Assessment: Overall WFL for tasks assessed    Cervical / Trunk Assessment Cervical / Trunk Assessment: Normal Cervical / Trunk Exceptions: hx of back sx  Communication   Communication: No difficulties  Cognition Arousal/Alertness: Awake/alert Behavior During Therapy: WFL for tasks assessed/performed Overall Cognitive Status: History of cognitive impairments - at baseline  General Comments General comments (skin integrity, edema, etc.): VSS    Exercises     Assessment/Plan    PT Assessment Patient needs continued PT services  PT Problem List Decreased strength;Decreased activity tolerance;Decreased balance;Decreased mobility;Decreased  coordination;Decreased cognition;Decreased knowledge of use of DME;Decreased safety awareness;Decreased knowledge of precautions       PT Treatment Interventions DME instruction;Gait training;Functional mobility training;Therapeutic activities;Therapeutic exercise;Balance training;Neuromuscular re-education;Patient/family education    PT Goals (Current goals can be found in the Care Plan section)  Acute Rehab PT Goals Patient Stated Goal: return to memory care facility PT Goal Formulation: With patient Time For Goal Achievement: 08/21/19 Potential to Achieve Goals: Good    Frequency Min 2X/week   Barriers to discharge        Co-evaluation               AM-PAC PT "6 Clicks" Mobility  Outcome Measure Help needed turning from your back to your side while in a flat bed without using bedrails?: None Help needed moving from lying on your back to sitting on the side of a flat bed without using bedrails?: None Help needed moving to and from a bed to a chair (including a wheelchair)?: None Help needed standing up from a chair using your arms (e.g., wheelchair or bedside chair)?: None Help needed to walk in hospital room?: A Little Help needed climbing 3-5 steps with a railing? : A Little 6 Click Score: 22    End of Session Equipment Utilized During Treatment: Gait belt Activity Tolerance: Patient limited by fatigue Patient left: in bed;with call bell/phone within reach;with bed alarm set Nurse Communication: Mobility status PT Visit Diagnosis: Other abnormalities of gait and mobility (R26.89);Muscle weakness (generalized) (M62.81)    Time: [id number] PT Time Calculation (min) (ACUTE ONLY): 19 min   Charges:   PT Evaluation $PT Eval Low Complexity: 1 Low          Maggie Font, PT Acute Rehab Services Pager [phone] [facility] [phone] [facility] [phone]   Karlton Lemon 08/07/2019, 3:40 PM

## 2019-08-07 NOTE — Discharge Summary (Signed)
Joy Ward, is a 79 y.o. female  DOB May 27, 1940  MRN [id number].  Admission date:  08/06/2019  Admitting Physician  Karmen Bongo, MD  Discharge Date:  08/07/2019   Primary MD  Gayland Curry, DO  Recommendations for primary care physician for things to follow:  -Patient will have 30 days Holter monitor arranged by see [facility]. -Please check CBC, CMP during next visit.    Admission Diagnosis  Syncope [R55] Recurrent syncope [R55]   Discharge Diagnosis  Syncope [R55] Recurrent syncope [R55]    Principal Problem:   Syncope Active Problems:   HYPERLIPIDEMIA, MIXED   Essential hypertension   Vascular dementia with behavior disturbance (HCC)   Hypothyroidism   Diabetes mellitus ([place])   Recurrent UTI      Past Medical History:  Diagnosis Date  . Allergy   . Arthritis   . Chronic back pain   . DDD (degenerative disc disease), lumbar   . Diabetes mellitus without complication ([place])   . Diabetes type 2, uncontrolled ([place]) 09/18/2006   Annotation: Noninsulin dependent Qualifier: Diagnosis of  By: Amil Amen MD, Benjamine Mola    . Former tobacco use   . Hyperlipidemia    a. H/o muscle aches with statins per PCP notes.  . Hypertension   . Hypothyroidism    a. prior thyroid removal.  . Memory loss or impairment 03/29/2019  . Stroke [facility])    a. Listed in PCP notes: "History of stroke:  History of terrible headache.  Had a CT scan of brain and found "ministroke"  Was removed from ERT subsequently. "    Past Surgical History:  Procedure Laterality Date  . ABDOMINAL HYSTERECTOMY  1994   TAH, not clear if unilateral oophorectomy as well.  [facility] [place]  . CHOLECYSTECTOMY  1973   open  . DECOMPRESSIVE LUMBAR LAMINECTOMY LEVEL 4  2005   In [place]  . THYROIDECTOMY  2012   multinodular goiter       History of present illness and  Hospital Course:     Kindly see H&P  for history of present illness and admission details, please review complete Labs, Consult reports and Test reports for all details in brief  HPI  from the history and physical done on the day of admission 08/06/2019   HPI: Joy Ward is a 79 y.o. female with medical history significant of CVA; dementia; hypothyroidism; HTN; HLD; DM; and chronic back pain presenting with recurrent syncope.  She was hospitalized from 4/4-6 for syncope.  She is fairly anxious and can't remember exactly what happened.  She didn't feel well yesterday and this AM asked for breakfast in her room.  The staff told her she had to get up and go to breakfast.  She went and ate little (it wasn't very good) and went back to her room.  The next thing she remembers she was in a hospital bed.  Reportedly, the staff heard a thud and found her unconscious.  She feels ok now but is very concerned  about what happened and frustrated about getting older and not remembering well.  She reports that she thinks she has had a UTI for months now but she can't describe any particular symptoms that make her think this or than maybe polyuria.  Her daughter reports that they found her in the bathroom and was drenched in sweat and they tried to lift her and she was dead weight.  Her daughter reported that she felt "weak as water" yesterday.  She hasn't been eating like she should and isn't drinking as much water as she needs to.  Prior to her hospitalization last week, she was seen in the ER on 3/28 for dizziness with standing.  She was diagnosed with a UTI and treated with antibiotics.  MRI was negative.  She has had recurrent UTIs with pansensitive E coli - culture + on 3/28, 2/21, 1/3.   ED Course:  Admitted last week for the same - syncope.  ALF staff heard a thud, found her unconscious for seconds.  Now at baseline, does not remember event.  Benign evaluation thus far, EKG with old 1st degree heart block.  Had EEG, dopplers, echo.  ?arrythmia.  Hospital Course   Syncope -She was previously admitted a week ago for the same issue -During her last hospitalization, she had unremarkable Echo, carotids, and EEG , as well she had an MRI done 07/23/2019 with no acute abnormality, but significant for extensive chronic ischemic changes of the white matter, and chronic infarct in left lateral cerebellum, CT head on this admission with no acute finding, but significant for her extensive chronic ischemic changes. -No evidence of infectious process, UA is clean, chest x-ray with no acute findings. -EKG nonacute, troponins negative x2, she denies any chest pain. -He is not orthostatic. -No significant events on telemetry, at this point will need to evaluate for cardiac event contributing to her syncope, so I have requested 30 days Holter monitor from see [facility], which they will arrange. -If her Holter monitor work-up is inconclusive, then this would be the very likely related to her chronic dementia.  Recurrent UTI -As noted above, she has had 3 culture-proven E coli UTIs so far this year -UA is negative for any infectious process  Dementia -Continue Aricept and Seroquel  Hypothyroidism -recent thyroid testing was quite abnormal and indicated that she needs a higher dose of Synthroid -Synthroid dose was increased from 75 to 100 mcg during last hospitalization -Repeat thyroid testing in another 3-5 weeks  Pulmonary nodules -Incidentally seen on CT today -Consider 1-year f/u  HTN -Blood pressure has been acceptable of her medications, I will discontinue Cozaar, and 6, and continue only with amlodipine on discharge .  Hypokalemia  -Potassium low at 2.9, this was repleted .  HLD -Continue Pravachol  DM -Prior A1c was 6.7 -She was previously on Metformin but this is not on her current medication list  Chronic back pain -She takes Norco as needed.  Discharge Condition:  stable   Follow UP  Follow-up  Information    Reed, Tiffany L, DO Follow up in 1 week(s).   Specialty: Geriatric Medicine Contact information: [address]. [place] [address] [phone]             Discharge Instructions  and  Discharge Medications    Discharge Instructions    Discharge instructions   Complete by: As directed    Follow with Primary MD Mariea Clonts, Tiffany L, DO in 7 days   Get CBC, CMP, 2 view  Chest X ray checked  by Primary MD next visit.    Activity: As tolerated with Full fall precautions use walker/cane & assistance as needed   Disposition Home    Diet: Heart Healthy  , with feeding assistance and aspiration precautions.  For Heart failure patients - Check your Weight same time everyday, if you gain over 2 pounds, or you develop in leg swelling, experience more shortness of breath or chest pain, call your Primary MD immediately. Follow Cardiac Low Salt Diet and 1.5 lit/day fluid restriction.   On your next visit with your primary care physician please Get Medicines reviewed and adjusted.   Please request your Prim.MD to go over all Hospital Tests and Procedure/Radiological results at the follow up, please get all Hospital records sent to your Prim MD by signing hospital release before you go home.   If you experience worsening of your admission symptoms, develop shortness of breath, life threatening emergency, suicidal or homicidal thoughts you must seek medical attention immediately by calling 911 or calling your MD immediately  if symptoms less severe.  You Must read complete instructions/literature along with all the possible adverse reactions/side effects for all the Medicines you take and that have been prescribed to you. Take any new Medicines after you have completely understood and accpet all the possible adverse reactions/side effects.   Do not drive, operating heavy machinery, perform activities at heights, swimming or participation in water activities or provide baby  sitting services if your were admitted for syncope or siezures until you have seen by Primary MD or a Neurologist and advised to do so again.  Do not drive when taking Pain medications.    Do not take more than prescribed Pain, Sleep and Anxiety Medications  Special Instructions: If you have smoked or chewed Tobacco  in the last 2 yrs please stop smoking, stop any regular Alcohol  and or any Recreational drug use.  Wear Seat belts while driving.   Please note  You were cared for by a hospitalist during your hospital stay. If you have any questions about your discharge medications or the care you received while you were in the hospital after you are discharged, you can call the unit and asked to speak with the hospitalist on call if the hospitalist that took care of you is not available. Once you are discharged, your primary care physician will handle any further medical issues. Please note that NO REFILLS for any discharge medications will be authorized once you are discharged, as it is imperative that you return to your primary care physician (or establish a relationship with a primary care physician if you do not have one) for your aftercare needs so that they can reassess your need for medications and monitor your lab values.   Increase activity slowly   Complete by: As directed      Allergies as of 08/07/2019      Reactions   Clindamycin Hcl Other (See Comments)   REACTION: C. difficile colitis   Sulfamethoxazole-trimethoprim Other (See Comments)    Eye redness   Zanaflex [tizanidine Hcl] Anaphylaxis   Metronidazole Hives   Statins Other (See Comments)   Side Effect: muscle pain Has tried Zocor, Crestor--she does not want to take a statin, period   Bee Venom Other (See Comments)   Unknown reaction   Ace Inhibitors Cough   Adhesive [tape] Itching, Rash, Other (See Comments)   Blistering of skin      Medication List    STOP  taking these medications   furosemide 20 MG  tablet Commonly known as: LASIX   losartan 100 MG tablet Commonly known as: COZAAR     TAKE these medications   acetaminophen 500 MG tablet Commonly known as: TYLENOL Take 500 mg by mouth every 6 (six) hours as needed (fever 99.5-101, minor headache, minor discomfort).   amLODipine 5 MG tablet Commonly known as: NORVASC Take 1 tablet (5 mg total) by mouth daily.   bisacodyl 10 MG suppository Commonly known as: DULCOLAX Place 1 suppository (10 mg total) rectally daily as needed for moderate constipation.   donepezil 5 MG tablet Commonly known as: ARICEPT Take 1 tablet (5 mg total) by mouth at bedtime.   Fish Oil 1000 MG Caps Take 1,000 mg by mouth daily.   HYDROcodone-acetaminophen 10-325 MG tablet Commonly known as: NORCO Take 1 tablet by mouth every 6 (six) hours as needed for moderate pain.   levothyroxine 100 MCG tablet Commonly known as: Synthroid Take 1 tablet (100 mcg total) by mouth daily.   loperamide 2 MG capsule Commonly known as: IMODIUM Take 2 mg by mouth as needed for diarrhea or loose stools (do not exceed 8 dsoses in 24 hours).   magnesium hydroxide 400 MG/5ML suspension Commonly known as: MILK OF MAGNESIA Take 30 mLs by mouth at bedtime as needed (constipation).   polyethylene glycol 17 g packet Commonly known as: MIRALAX / GLYCOLAX Take 17 g by mouth daily.   pravastatin 20 MG tablet Commonly known as: PRAVACHOL Take 1 tablet (20 mg total) by mouth daily at 6 PM.   QUEtiapine 100 MG tablet Commonly known as: SEROQUEL Take 100 mg by mouth at bedtime.         Diet and Activity recommendation: See Discharge Instructions above   Consults obtained -  None   Major procedures and Radiology Reports - PLEASE review detailed and final reports for all details, in brief -    CT ABDOMEN PELVIS WO CONTRAST  Result Date: 07/31/2019 CLINICAL DATA:  79 year old female with melena. EXAM: CT ABDOMEN AND PELVIS WITHOUT CONTRAST TECHNIQUE:  Multidetector CT imaging of the abdomen and pelvis was performed following the standard protocol without IV contrast. COMPARISON:  None. FINDINGS: Evaluation of this exam is limited in the absence of intravenous contrast. Lower chest: The visualized lung bases are clear. No intra-abdominal free air or free fluid. Hepatobiliary: The liver is unremarkable. The gallbladder is not visualized, likely surgically absent. No retained calcified stone noted in the central CBD. Pancreas: Unremarkable. No pancreatic ductal dilatation or surrounding inflammatory changes. Spleen: Normal in size without focal abnormality. Adrenals/Urinary Tract: The adrenal glands are unremarkable. Higher attenuating content within the renal collecting systems and urinary bladder likely represents retained contrast from recent IV administration. Blood product is less likely but not excluded. Clinical correlation is recommended. There is no hydronephrosis on either side. Stomach/Bowel: There is moderate stool throughout the colon. There is no bowel obstruction or active inflammation. The appendix is not visualized with certainty. No inflammatory changes identified in the right lower quadrant. Vascular/Lymphatic: Mild aortoiliac atherosclerotic disease. The IVC is unremarkable. No portal venous gas. There is no adenopathy. Reproductive: Hysterectomy. Other: Midline vertical anterior abdominal wall incisional scar. Musculoskeletal: Osteopenia with scoliosis and degenerative changes of the spine. Lower lumbar posterior fusion. No acute osseous pathology. IMPRESSION: 1. No acute intra-abdominal or pelvic pathology. 2. No bowel obstruction. 3. Aortic Atherosclerosis (ICD10-I70.0). Electronically Signed   By: Anner Crete M.D.   On: 07/31/2019 18:49   DG  Knee 2 Views Right  Result Date: 07/30/2019 CLINICAL DATA:  Syncope, knee pain EXAM: RIGHT KNEE - 1-2 VIEW; LEFT KNEE - COMPLETE 4+ VIEW COMPARISON:  None. FINDINGS: No fracture or dislocation of  the bilateral knees. Moderate arthrosis of the patellofemoral compartments. No knee joint effusion. Soft tissue edema anteriorly. IMPRESSION: No fracture or dislocation of the bilateral knees. Electronically Signed   By: Eddie Candle M.D.   On: 07/30/2019 18:15   CT Head Wo Contrast  Result Date: 08/06/2019 CLINICAL DATA:  Syncope, recurrent EXAM: CT HEAD WITHOUT CONTRAST CT CERVICAL SPINE WITHOUT CONTRAST TECHNIQUE: Multidetector CT imaging of the head and cervical spine was performed following the standard protocol without intravenous contrast. Multiplanar CT image reconstructions of the cervical spine were also generated. COMPARISON:  07/30/2019 FINDINGS: CT HEAD FINDINGS Brain: No evidence of acute infarction, hemorrhage, hydrocephalus, extra-axial collection or mass lesion/mass effect. Extensive white matter disease and signs of atrophy as before. Remote cerebellar infarct better demonstrated on previous MRIs. Vascular: No hyperdense vessel or unexpected calcification. Skull: Normal. Negative for fracture or focal lesion. Sinuses/Orbits: Mucous retention cyst or polyp in the left maxillary sinus. Similar to prior study. Other: None CT CERVICAL SPINE FINDINGS Alignment: Normal. Skull base and vertebrae: No acute fracture. No primary bone lesion or focal pathologic process. Soft tissues and spinal canal: No prevertebral fluid or swelling. No visible canal hematoma. Signs of thyroidectomy incidentally noted. Disc levels: Multilevel degenerative changes greatest at C5-6 and C6-7 with uncovertebral degenerative changes at these levels worse at C6-7 and greatest on the left with neural foraminal narrowing as result. Facet arthropathy is greatest also on the left at at C4-5 with degenerative ankylosis of facets at this level. Upper chest: 4 mm right upper lobe nodule. 3 mm left upper lobe nodule. Other: None IMPRESSION: 1. No acute intracranial abnormality. 2. Extensive white matter disease and signs of atrophy as  before. 3. No evidence for acute fracture or subluxation of the cervical spine. 4. Multilevel degenerative changes of the cervical spine greatest at C5-6 and C6-7 with left greater than right neural foraminal narrowing. 5. Small pulmonary nodules in the lung apices. No follow-up needed if patient is low-risk (and has no known or suspected primary neoplasm). Non-contrast chest CT can be considered in 12 months if patient is high-risk. This recommendation follows the consensus statement: Guidelines for Management of Incidental Pulmonary Nodules Detected on CT Images: From the Fleischner Society 2017; Radiology 2017; 284:228-243. Electronically Signed   By: Zetta Bills M.D.   On: 08/06/2019 13:57   CT Head Wo Contrast  Result Date: 07/30/2019 CLINICAL DATA:  Syncopal episode. Head trauma, minor (Age >= 65y) EXAM: CT HEAD WITHOUT CONTRAST TECHNIQUE: Contiguous axial images were obtained from the base of the skull through the vertex without intravenous contrast. COMPARISON:  Brain MRI 1 week ago 07/23/2019, and CT 05/01/2019 FINDINGS: Brain: Stable degree of atrophy. Stable advanced chronic small vessel ischemia. No intracranial hemorrhage, mass effect, or midline shift. No hydrocephalus. The basilar cisterns are patent. Remote left cerebellar infarct with is better defined on recent MRI. No evidence of territorial infarct or acute ischemia. No extra-axial or intracranial fluid collection. Vascular: Atherosclerosis of skullbase vasculature without hyperdense vessel or abnormal calcification. Skull: No fracture or focal lesion. Sinuses/Orbits: Mucous retention cyst left maxillary sinus. The mastoid air cells are clear. The visualized orbits are unremarkable. Other: None. IMPRESSION: 1. No acute intracranial abnormality. 2. Stable atrophy and advanced chronic small vessel ischemia. Remote left cerebellar infarct was better delineated  on recent MRI. Electronically Signed   By: Keith Rake M.D.   On: 07/30/2019  17:44   CT ANGIO CHEST PE W OR WO CONTRAST  Result Date: 07/31/2019 CLINICAL DATA:  Syncopal episode. EXAM: CT ANGIOGRAPHY CHEST WITH CONTRAST TECHNIQUE: Multidetector CT imaging of the chest was performed using the standard protocol during bolus administration of intravenous contrast. Multiplanar CT image reconstructions and MIPs were obtained to evaluate the vascular anatomy. CONTRAST:  25mL OMNIPAQUE IOHEXOL 350 MG/ML SOLN COMPARISON:  August 22, 2009 FINDINGS: Cardiovascular: There is mild calcification of the aortic arch. Satisfactory opacification of the pulmonary arteries to the segmental level. No evidence of pulmonary embolism. Normal heart size. No pericardial effusion. Moderate severity coronary artery calcification is noted. Mediastinum/Nodes: No enlarged mediastinal, hilar, or axillary lymph nodes. Thyroid gland, trachea, and esophagus demonstrate no significant findings. Lungs/Pleura: A stable 4 mm noncalcified lung nodule is seen within the posterior aspect of the right apex. Additional predominant stable 6 mm noncalcified lung nodule is seen within the inferior aspect of the right upper lobe. Upper Abdomen: There is a small hiatal hernia. Musculoskeletal: Multilevel degenerative changes seen throughout the thoracic spine. Review of the MIP images confirms the above findings. IMPRESSION: 1. No CT evidence of pulmonary embolism. 2. Moderate severity coronary artery calcification. 3. Small hiatal hernia. 4. Two stable noncalcified lung nodules within the right upper lobe, the largest measuring 6 mm. No follow-up needed if patient is low-risk (and has no known or suspected primary neoplasm). Non-contrast chest CT can be considered in 12 months if patient is high-risk. This recommendation follows the consensus statement: Guidelines for Management of Incidental Pulmonary Nodules Detected on CT Images:From the Fleischner Society 2017; published online before print ( ID Nonie Hoyer. ID ). Aortic  Atherosclerosis (ICD10-I70.0). Electronically Signed   By: Virgina Norfolk M.D.   On: 07/31/2019 02:04   CT Cervical Spine Wo Contrast  Result Date: 08/06/2019 CLINICAL DATA:  Syncope, recurrent EXAM: CT HEAD WITHOUT CONTRAST CT CERVICAL SPINE WITHOUT CONTRAST TECHNIQUE: Multidetector CT imaging of the head and cervical spine was performed following the standard protocol without intravenous contrast. Multiplanar CT image reconstructions of the cervical spine were also generated. COMPARISON:  07/30/2019 FINDINGS: CT HEAD FINDINGS Brain: No evidence of acute infarction, hemorrhage, hydrocephalus, extra-axial collection or mass lesion/mass effect. Extensive white matter disease and signs of atrophy as before. Remote cerebellar infarct better demonstrated on previous MRIs. Vascular: No hyperdense vessel or unexpected calcification. Skull: Normal. Negative for fracture or focal lesion. Sinuses/Orbits: Mucous retention cyst or polyp in the left maxillary sinus. Similar to prior study. Other: None CT CERVICAL SPINE FINDINGS Alignment: Normal. Skull base and vertebrae: No acute fracture. No primary bone lesion or focal pathologic process. Soft tissues and spinal canal: No prevertebral fluid or swelling. No visible canal hematoma. Signs of thyroidectomy incidentally noted. Disc levels: Multilevel degenerative changes greatest at C5-6 and C6-7 with uncovertebral degenerative changes at these levels worse at C6-7 and greatest on the left with neural foraminal narrowing as result. Facet arthropathy is greatest also on the left at at C4-5 with degenerative ankylosis of facets at this level. Upper chest: 4 mm right upper lobe nodule. 3 mm left upper lobe nodule. Other: None IMPRESSION: 1. No acute intracranial abnormality. 2. Extensive white matter disease and signs of atrophy as before. 3. No evidence for acute fracture or subluxation of the cervical spine. 4. Multilevel degenerative changes of the cervical spine greatest  at C5-6 and C6-7 with left greater than right neural foraminal  narrowing. 5. Small pulmonary nodules in the lung apices. No follow-up needed if patient is low-risk (and has no known or suspected primary neoplasm). Non-contrast chest CT can be considered in 12 months if patient is high-risk. This recommendation follows the consensus statement: Guidelines for Management of Incidental Pulmonary Nodules Detected on CT Images: From the Fleischner Society 2017; Radiology 2017; [id number]. Electronically Signed   By: Zetta Bills M.D.   On: 08/06/2019 13:57   MR BRAIN WO CONTRAST  Result Date: 07/23/2019 CLINICAL DATA:  TIA.  Dizziness EXAM: MRI HEAD WITHOUT CONTRAST TECHNIQUE: Multiplanar, multiecho pulse sequences of the brain and surrounding structures were obtained without intravenous contrast. COMPARISON:  CT head 05/01/2019 FINDINGS: Brain: Image quality degraded by motion. Rapid scanning technique utilized due to motion also degrading image quality. Negative for acute infarct. Hyperintensity in the left lateral cerebellum. There is some volume loss in this area on coronal T2 suggestive of chronic infarction. No change from the prior CT. Generalized atrophy. Moderate to advanced chronic microvascular ischemic changes throughout the cerebral white matter bilaterally. Negative for hemorrhage or mass. Vascular: Normal arterial flow voids. Skull and upper cervical spine: No focal skeletal lesion. Sinuses/Orbits: Mild mucosal edema left maxillary sinus. Negative orbit Other: None IMPRESSION: No acute abnormality Extensive chronic ischemic change in the white matter. Chronic infarct left lateral cerebellum. Electronically Signed   By: Franchot Gallo M.D.   On: 07/23/2019 11:27   DG Chest Portable 1 View  Result Date: 08/06/2019 CLINICAL DATA:  Syncope EXAM: PORTABLE CHEST 1 VIEW COMPARISON:  07/30/2019 FINDINGS: The heart size and mediastinal contours are within normal limits. No focal airspace consolidation,  pleural effusion, or pneumothorax. The visualized skeletal structures are unremarkable. IMPRESSION: No active disease. Electronically Signed   By: Davina Poke D.O.   On: 08/06/2019 13:14   DG Chest Port 1 View  Result Date: 07/30/2019 CLINICAL DATA:  Syncope, knee pain EXAM: PORTABLE CHEST 1 VIEW COMPARISON:  06/24/2019 FINDINGS: The heart size and mediastinal contours are within normal limits. Both lungs are clear. The visualized skeletal structures are unremarkable. IMPRESSION: Normal study. Electronically Signed   By: Rolm Baptise M.D.   On: 07/30/2019 18:15   DG Knee Complete 4 Views Left  Result Date: 07/30/2019 CLINICAL DATA:  Syncope, knee pain EXAM: RIGHT KNEE - 1-2 VIEW; LEFT KNEE - COMPLETE 4+ VIEW COMPARISON:  None. FINDINGS: No fracture or dislocation of the bilateral knees. Moderate arthrosis of the patellofemoral compartments. No knee joint effusion. Soft tissue edema anteriorly. IMPRESSION: No fracture or dislocation of the bilateral knees. Electronically Signed   By: Eddie Candle M.D.   On: 07/30/2019 18:15   EEG adult  Result Date: 07/31/2019 Lora Havens, MD     07/31/2019 11:08 AM Patient Name: Joy Ward MRN: [id number] Epilepsy Attending: Lora Havens Referring Physician/Provider: Dr. Shela Leff Date: 07/31/2019 Duration: 25.16 minutes Patient history: 79 year old female who presented with syncope.  EEG evaluate for seizures. Level of alertness: Awake AEDs during EEG study: None Technical aspects: This EEG study was done with scalp electrodes positioned according to the 10-20 International system of electrode placement. Electrical activity was acquired at a sampling rate of 500Hz  and reviewed with a high frequency filter of 70Hz  and a low frequency filter of 1Hz . EEG data were recorded continuously and digitally stored. Description: The posterior dominant rhythm consists of 8 Hz activity of moderate voltage (25-35 uV) seen predominantly in posterior head regions,  symmetric and reactive to eye opening and eye closing.  Intermittent generalized 2 to 3 Hz delta slowing was also noted.  Photic driving was not seen during photic stimulation.  Hyperventilation was not performed. Abnormality -Intermittent slow, generalized IMPRESSION: This study is suggestive of mild diffuse encephalopathy, nonspecific etiology.  No seizures or epileptiform discharges were seen throughout the recording. Lora Havens   ECHOCARDIOGRAM COMPLETE  Result Date: 07/31/2019    ECHOCARDIOGRAM REPORT   Patient Name:   Joy Ward Date of Exam: 07/31/2019 Medical Rec #:  [id number]      Height:       65.0 in Accession #:    [id number]     Weight:       149.9 lb Date of Birth:  [birth year]      BSA:          1.750 m Patient Age:    60 years       BP:           154/77 mmHg Patient Gender: F              HR:           100 bpm. Exam Location:  Inpatient Procedure: 2D Echo, Cardiac Doppler and Color Doppler Indications:    Syncope  History:        Patient has prior history of Echocardiogram examinations, most                 recent 04/23/2016. Stroke; Risk Factors:Diabetes, Hypertension                 and Dyslipidemia. Dementia.  Sonographer:    Dustin Flock Referring Phys: [id number] [place]  1. Left ventricular ejection fraction, by estimation, is 65 to 70%. The left ventricle has normal function. The left ventricle has no regional wall motion abnormalities. There is mild concentric left ventricular hypertrophy. Indeterminate diastolic filling due to E-A fusion.  2. Right ventricular systolic function is normal. The right ventricular size is normal. There is normal pulmonary artery systolic pressure. The estimated right ventricular systolic pressure is 123456 mmHg.  3. The mitral valve is degenerative. Trivial mitral valve regurgitation. No evidence of mitral stenosis.  4. The aortic valve is tricuspid. Aortic valve regurgitation is not visualized. Mild aortic valve stenosis.  5. The  inferior vena cava is normal in size with greater than 50% respiratory variability, suggesting right atrial pressure of 3 mmHg. Comparison(s): A prior study was performed on 04/23/2016. No significant change from prior study. Prior images reviewed side by side. FINDINGS  Left Ventricle: Left ventricular ejection fraction, by estimation, is 65 to 70%. The left ventricle has normal function. The left ventricle has no regional wall motion abnormalities. The left ventricular internal cavity size was normal in size. There is  mild concentric left ventricular hypertrophy. Indeterminate diastolic filling due to E-A fusion. Right Ventricle: The right ventricular size is normal. No increase in right ventricular wall thickness. Right ventricular systolic function is normal. There is normal pulmonary artery systolic pressure. The tricuspid regurgitant velocity is 2.03 m/s, and  with an assumed right atrial pressure of 3 mmHg, the estimated right ventricular systolic pressure is 123456 mmHg. Left Atrium: Left atrial size was normal in size. Right Atrium: Right atrial size was normal in size. Pericardium: There is no evidence of pericardial effusion. Presence of pericardial fat pad. Mitral Valve: The mitral valve is degenerative in appearance. Mild mitral annular calcification. Trivial mitral valve regurgitation. No evidence of mitral valve stenosis. Tricuspid Valve: The tricuspid valve is grossly  normal. Tricuspid valve regurgitation is not demonstrated. No evidence of tricuspid stenosis. Aortic Valve: The aortic valve is tricuspid. . There is mild thickening and mild calcification of the aortic valve. Aortic valve regurgitation is not visualized. Mild aortic stenosis is present. There is mild thickening of the aortic valve. There is mild  calcification of the aortic valve. Aortic valve mean gradient measures 5.5 mmHg. Aortic valve peak gradient measures 9.5 mmHg. Aortic valve area, by VTI measures 1.56 cm. Pulmonic Valve: The  pulmonic valve was grossly normal. Pulmonic valve regurgitation is not visualized. No evidence of pulmonic stenosis. Aorta: The aortic root is normal in size and structure. Venous: The inferior vena cava is normal in size with greater than 50% respiratory variability, suggesting right atrial pressure of 3 mmHg. IAS/Shunts: The interatrial septum appears to be lipomatous. The atrial septum is grossly normal.  LEFT VENTRICLE PLAX 2D LVIDd:         3.20 cm  Diastology LVIDs:         2.40 cm  LV e' lateral:   8.38 cm/s LV PW:         1.20 cm  LV E/e' lateral: 7.4 LV IVS:        1.30 cm  LV e' medial:    6.31 cm/s LVOT diam:     1.90 cm  LV E/e' medial:  9.8 LV SV:         49 LV SV Index:   28 LVOT Area:     2.84 cm  RIGHT VENTRICLE RV Basal diam:  2.60 cm RV S prime:     9.36 cm/s TAPSE (M-mode): 2.7 cm LEFT ATRIUM             Index       RIGHT ATRIUM           Index LA diam:        3.50 cm 2.00 cm/m  RA Area:     12.40 cm LA Vol (A2C):   33.9 ml 19.37 ml/m RA Volume:   27.80 ml  15.89 ml/m LA Vol (A4C):   51.2 ml 29.26 ml/m LA Biplane Vol: 43.3 ml 24.74 ml/m  AORTIC VALVE AV Area (Vmax):    1.53 cm AV Area (Vmean):   1.37 cm AV Area (VTI):     1.56 cm AV Vmax:           154.17 cm/s AV Vmean:          113.328 cm/s AV VTI:            0.314 m AV Peak Grad:      9.5 mmHg AV Mean Grad:      5.5 mmHg LVOT Vmax:         83.30 cm/s LVOT Vmean:        54.900 cm/s LVOT VTI:          0.173 m LVOT/AV VTI ratio: 0.55  AORTA Ao Root diam: 2.70 cm MITRAL VALVE                TRICUSPID VALVE MV Area (PHT): 4.36 cm     TR Peak grad:   16.5 mmHg MV Decel Time: 174 msec     TR Vmax:        203.00 cm/s MV E velocity: 61.80 cm/s MV A velocity: 122.00 cm/s  SHUNTS MV E/A ratio:  0.51         Systemic VTI:  0.17 m  Systemic Diam: 1.90 cm Eleonore Chiquito MD Electronically signed by Eleonore Chiquito MD Signature Date/Time: 07/31/2019/2:13:35 PM    Final    VAS US CAROTID  Result Date: 07/31/2019 Carotid  Arterial Duplex Study Indications: Syncope. Performing Technologist: Maudry Mayhew MHA, RDMS, RVT, RDCS  Examination Guidelines: A complete evaluation includes B-mode imaging, spectral Doppler, color Doppler, and power Doppler as needed of all accessible portions of each vessel. Bilateral testing is considered an integral part of a complete examination. Limited examinations for reoccurring indications may be performed as noted.  Right Carotid Findings: +----------+--------+--------+--------+-----------------------+--------+           PSV cm/sEDV cm/sStenosisPlaque Description     Comments +----------+--------+--------+--------+-----------------------+--------+ CCA Prox  78      14                                              +----------+--------+--------+--------+-----------------------+--------+ CCA Distal62      16              smooth and heterogenous         +----------+--------+--------+--------+-----------------------+--------+ ICA Prox  39      15              smooth and heterogenous         +----------+--------+--------+--------+-----------------------+--------+ ICA Distal71      25                                              +----------+--------+--------+--------+-----------------------+--------+ ECA       58      9                                               +----------+--------+--------+--------+-----------------------+--------+ +----------+--------+-------+----------------+-------------------+           PSV cm/sEDV cmsDescribe        Arm Pressure (mmHG) +----------+--------+-------+----------------+-------------------+ [id number]             Multiphasic, WNL                    +----------+--------+-------+----------------+-------------------+ +---------+--------+--+--------+--+---------+ VertebralPSV cm/s73EDV cm/s21Antegrade +---------+--------+--+--------+--+---------+  Left Carotid Findings:  +----------+--------+--------+--------+-----------------------+--------+           PSV cm/sEDV cm/sStenosisPlaque Description     Comments +----------+--------+--------+--------+-----------------------+--------+ CCA Prox  90      19                                              +----------+--------+--------+--------+-----------------------+--------+ CCA Distal74      14              smooth and heterogenous         +----------+--------+--------+--------+-----------------------+--------+ ICA Prox  71      15              smooth and heterogenous         +----------+--------+--------+--------+-----------------------+--------+ ICA Distal41      16                                              +----------+--------+--------+--------+-----------------------+--------+  ECA       58      7                                               +----------+--------+--------+--------+-----------------------+--------+ +----------+--------+--------+----------------+-------------------+           PSV cm/sEDV cm/sDescribe        Arm Pressure (mmHG) +----------+--------+--------+----------------+-------------------+ [id number]             Multiphasic, WNL                    +----------+--------+--------+----------------+-------------------+ +---------+--------+--+--------+--+---------+ VertebralPSV cm/s52EDV cm/s15Antegrade +---------+--------+--+--------+--+---------+   Summary: Right Carotid: Velocities in the right ICA are consistent with a 1-39% stenosis. Left Carotid: Velocities in the left ICA are consistent with a 1-39% stenosis. Vertebrals:  Bilateral vertebral arteries demonstrate antegrade flow. Subclavians: Normal flow hemodynamics were seen in bilateral subclavian              arteries. *See table(s) above for measurements and observations.  Electronically signed by Antony Contras MD on 07/31/2019 at 12:31:19 PM.    Final     Micro Results   Recent Results (from the  past 240 hour(s))  Respiratory Panel by RT PCR (Flu A&B, Covid) - Nasopharyngeal Swab     Status: None   Collection Time: 07/31/19  2:37 AM   Specimen: Nasopharyngeal Swab  Result Value Ref Range Status   SARS Coronavirus 2 by RT PCR NEGATIVE NEGATIVE Final    Comment: (NOTE) SARS-CoV-2 target nucleic acids are NOT DETECTED. The SARS-CoV-2 RNA is generally detectable in upper respiratoy specimens during the acute phase of infection. The lowest concentration of SARS-CoV-2 viral copies this assay can detect is 131 copies/mL. A negative result does not preclude SARS-Cov-2 infection and should not be used as the sole basis for treatment or other patient management decisions. A negative result may occur with  improper specimen collection/handling, submission of specimen other than nasopharyngeal swab, presence of viral mutation(s) within the areas targeted by this assay, and inadequate number of viral copies (<131 copies/mL). A negative result must be combined with clinical observations, patient history, and epidemiological information. The expected result is Negative. Fact Sheet for Patients:  PinkCheek.be Fact Sheet for Healthcare Providers:  GravelBags.it This test is not yet ap proved or cleared by the [place] FDA and  has been authorized for detection and/or diagnosis of SARS-CoV-2 by FDA under an Emergency Use Authorization (EUA). This EUA will remain  in effect (meaning this test can be used) for the duration of the COVID-19 declaration under Section 564(b)(1) of the Act, 21 U.S.C. section 360bbb-3(b)(1), unless the authorization is terminated or revoked sooner.    Influenza A by PCR NEGATIVE NEGATIVE Final   Influenza B by PCR NEGATIVE NEGATIVE Final    Comment: (NOTE) The Xpert Xpress SARS-CoV-2/FLU/RSV assay is intended as an aid in  the diagnosis of influenza from Nasopharyngeal swab specimens and  should not be  used as a sole basis for treatment. Nasal washings and  aspirates are unacceptable for Xpert Xpress SARS-CoV-2/FLU/RSV  testing. Fact Sheet for Patients: PinkCheek.be Fact Sheet for Healthcare Providers: GravelBags.it This test is not yet approved or cleared by the [place] FDA and  has been authorized for detection and/or diagnosis of SARS-CoV-2 by  FDA under an Emergency Use Authorization (EUA). This EUA will remain  in effect (meaning  this test can be used) for the duration of the  Covid-19 declaration under Section 564(b)(1) of the Act, 21  U.S.C. section 360bbb-3(b)(1), unless the authorization is  terminated or revoked. Performed at [facility], [place] [address], [place], [place] [address]   SARS CORONAVIRUS 2 (TAT 6-24 HRS) Nasopharyngeal Nasopharyngeal Swab     Status: None   Collection Time: 08/06/19  3:55 PM   Specimen: Nasopharyngeal Swab  Result Value Ref Range Status   SARS Coronavirus 2 NEGATIVE NEGATIVE Final    Comment: (NOTE) SARS-CoV-2 target nucleic acids are NOT DETECTED. The SARS-CoV-2 RNA is generally detectable in upper and lower respiratory specimens during the acute phase of infection. Negative results do not preclude SARS-CoV-2 infection, do not rule out co-infections with other pathogens, and should not be used as the sole basis for treatment or other patient management decisions. Negative results must be combined with clinical observations, patient history, and epidemiological information. The expected result is Negative. Fact Sheet for Patients: SugarRoll.be Fact Sheet for Healthcare Providers: https://www.woods-mathews.com/ This test is not yet approved or cleared by the [place] FDA and  has been authorized for detection and/or diagnosis of SARS-CoV-2 by FDA under an Emergency Use Authorization (EUA). This EUA will remain  in effect  (meaning this test can be used) for the duration of the COVID-19 declaration under Section 56 4(b)(1) of the Act, 21 U.S.C. section 360bbb-3(b)(1), unless the authorization is terminated or revoked sooner. Performed at [facility], [place] [address], [place], [place] [address]   MRSA PCR Screening     Status: None   Collection Time: 08/06/19 10:54 PM   Specimen: Nasal Mucosa; Nasopharyngeal  Result Value Ref Range Status   MRSA by PCR NEGATIVE NEGATIVE Final    Comment:        The GeneXpert MRSA Assay (FDA approved for NASAL specimens only), is one component of a comprehensive MRSA colonization surveillance program. It is not intended to diagnose MRSA infection nor to guide or monitor treatment for MRSA infections. Performed at [facility], [place] [address], [place], [place] [address]        Today   Subjective:   Joy Ward today has no headache,no chest or abdominal pain, he denies any lightheadedness, focal deficits, or tingling or numbness .  Objective:   Blood pressure 127/74, pulse 77, temperature (!) 97.4 F (36.3 C), temperature source Axillary, resp. rate 19, SpO2 98 %.   Intake/Output Summary (Last 24 hours) at 08/07/2019 1401 Last data filed at 08/07/2019 1126 Gross per 24 hour  Intake 1194.08 ml  Output 802 ml  Net 392.08 ml    Exam Awake , pleasant, uncomfortable in no apparent distress  Symmetrical Chest wall movement, Good air movement bilaterally, CTAB RRR,No Gallops,Rubs or new Murmurs, No Parasternal Heave +ve B.Sounds, Abd Soft, Non tender, No rebound -guarding or rigidity. No Cyanosis, Clubbing or edema, No new Rash or bruise  Data Review   CBC w Diff:  Lab Results  Component Value Date   WBC 5.4 08/07/2019   HGB 11.1 (L) 08/07/2019   HGB 12.3 10/02/2016   HCT 33.5 (L) 08/07/2019   HCT 36.7 10/02/2016   PLT 224 08/07/2019   PLT 255 10/02/2016   LYMPHOPCT 22 08/06/2019   MONOPCT 7 08/06/2019   EOSPCT 2 08/06/2019    BASOPCT 1 08/06/2019    CMP:  Lab Results  Component Value Date   NA 140 08/07/2019   NA 139 01/05/2018  K 2.9 (L) 08/07/2019   CL 106 08/07/2019   CO2 24 08/07/2019   BUN 8 08/07/2019   BUN 14 01/05/2018   CREATININE 0.71 08/07/2019   CREATININE 0.94 (H) 06/29/2019   PROT 6.5 08/06/2019   PROT 7.1 12/18/2016   ALBUMIN 3.5 08/06/2019   ALBUMIN 4.3 12/18/2016   BILITOT 0.8 08/06/2019   BILITOT 0.4 12/18/2016   ALKPHOS 55 08/06/2019   AST 21 08/06/2019   ALT 15 08/06/2019  .   Total Time in preparing paper work, data evaluation and todays exam - 65 minutes  Phillips Climes M.D on 08/07/2019 at 2:01 PM  [facility]   Office  [phone]

## 2019-08-07 NOTE — Plan of Care (Signed)
  Problem: Skin Integrity: Goal: Risk for impaired skin integrity will decrease Outcome: Progressing   Problem: Coping: Goal: Level of anxiety will decrease Outcome: Progressing   

## 2019-08-07 NOTE — Progress Notes (Signed)
Pt d/c with PTAR to [facility]. Pt's belongings sent with pt. Will continue to monitor pt. Ranelle Oyster, RN

## 2019-08-07 NOTE — Progress Notes (Signed)
Orthostatic vitals  Lying : BP: 130/70  HR: 60  Siting : BP: 137/92  HR: 69  Standing: BP: 134/92  HR: 78  * pt unable to stand for 3 minutes

## 2019-08-07 NOTE — Progress Notes (Signed)
OT Cancellation Note  Patient Details Name: Joy Ward MRN: [id number] DOB: 1940-09-27   Cancelled Treatment:    Reason Eval/Treat Not Completed: Patient declined, no reason specified  Malka So 08/07/2019, 11:04 AM  Nestor Lewandowsky, OTR/L Acute Rehabilitation Services Pager: [phone] Office: [phone]

## 2019-08-07 NOTE — TOC Transition Note (Signed)
Transition of Care [facility]) - CM/SW Discharge Note   Patient Details  Name: Joy Ward MRN: [id number] Date of Birth: 1940/08/16  Transition of [facility]) CM/SW Contact:  Benard Halsted, LCSW Phone Number: 08/07/2019, 5:00 PM   Clinical Narrative:    Patient will DC to: [facility] Anticipated DC date: 08/07/19 Family notified: Daughter, Surveyor, quantity by: Corey Harold 6:30pm   Per MD patient ready for DC to ALF. RN, patient, patient's family, and facility notified of DC. Discharge Summary sent to facility. RN to call report prior to discharge [phone]). DC packet on chart. Ambulance transport requested for patient.   CSW will sign off for now as social work intervention is no longer needed. Please consult [place] again if new needs arise.      Final next level of care: Assisted Living Barriers to Discharge: No Barriers Identified   Patient Goals and CMS Choice Patient states their goals for this hospitalization and ongoing recovery are:: Return to ALF CMS Medicare.gov Compare Post Acute Care list provided to:: Patient Choice offered to / list presented to : Patient, Adult Children  Discharge Placement              Patient chooses bed at: [facility]) Patient to be transferred to facility by: Daughter, [name] Name of family member notified: [name] Patient and family notified of of transfer: 08/07/19  Discharge Plan and Services In-house Referral: Clinical Social Work                                   Social Determinants of Health ([place]) Interventions     Readmission Risk Interventions No flowsheet data found.

## 2019-08-07 NOTE — Discharge Instructions (Signed)
Follow with Primary MD Mariea Clonts, Tiffany L, DO in 7 days   Get CBC, CMP, 2 view Chest X ray checked  by Primary MD next visit.    Activity: As tolerated with Full fall precautions use walker/cane & assistance as needed   Disposition Home    Diet: Heart Healthy  , with feeding assistance and aspiration precautions.  For Heart failure patients - Check your Weight same time everyday, if you gain over 2 pounds, or you develop in leg swelling, experience more shortness of breath or chest pain, call your Primary MD immediately. Follow Cardiac Low Salt Diet and 1.5 lit/day fluid restriction.   On your next visit with your primary care physician please Get Medicines reviewed and adjusted.   Please request your Prim.MD to go over all Hospital Tests and Procedure/Radiological results at the follow up, please get all Hospital records sent to your Prim MD by signing hospital release before you go home.   If you experience worsening of your admission symptoms, develop shortness of breath, life threatening emergency, suicidal or homicidal thoughts you must seek medical attention immediately by calling 911 or calling your MD immediately  if symptoms less severe.  You Must read complete instructions/literature along with all the possible adverse reactions/side effects for all the Medicines you take and that have been prescribed to you. Take any new Medicines after you have completely understood and accpet all the possible adverse reactions/side effects.   Do not drive, operating heavy machinery, perform activities at heights, swimming or participation in water activities or provide baby sitting services if your were admitted for syncope or siezures until you have seen by Primary MD or a Neurologist and advised to do so again.  Do not drive when taking Pain medications.    Do not take more than prescribed Pain, Sleep and Anxiety Medications  Special Instructions: If you have smoked or chewed Tobacco  in  the last 2 yrs please stop smoking, stop any regular Alcohol  and or any Recreational drug use.  Wear Seat belts while driving.   Please note  You were cared for by a hospitalist during your hospital stay. If you have any questions about your discharge medications or the care you received while you were in the hospital after you are discharged, you can call the unit and asked to speak with the hospitalist on call if the hospitalist that took care of you is not available. Once you are discharged, your primary care physician will handle any further medical issues. Please note that NO REFILLS for any discharge medications will be authorized once you are discharged, as it is imperative that you return to your primary care physician (or establish a relationship with a primary care physician if you do not have one) for your aftercare needs so that they can reassess your need for medications and monitor your lab values.

## 2019-08-07 NOTE — Progress Notes (Signed)
PT Cancellation Note  Patient Details Name: Joy Ward MRN: [id number] DOB: [birth year]   Cancelled Treatment:    Reason Eval/Treat Not Completed: Patient declined, no reason specified  Pt declined PT this am, no reason stated - just "later."  Will f/u as able.   Mikael Spray Tasmin Exantus 08/07/2019, 1040am

## 2019-08-08 ENCOUNTER — Telehealth: Payer: Self-pay | Admitting: Internal Medicine

## 2019-08-08 LAB — URINE CULTURE

## 2019-08-08 NOTE — Telephone Encounter (Signed)
Usually they fax me the discharge summary that I've already seen in epic and ask me to manually sign it to approve the changes and then it's faxed back--the whole process takes several days when a verbal order could probably suffice.  It looked to me like she wasn't taking the medication at all b/c I'd adjusted the dose before b/c it was not where it should be.

## 2019-08-08 NOTE — Telephone Encounter (Signed)
Dr. Mariea Clonts there was no message, what did you want to do with the medication?

## 2019-08-08 NOTE — Telephone Encounter (Signed)
So it looks like patient's Levothyroxine was changed from 70mcg to 1109mcg in the hospital per the discharge summary. i'm guessing [facility] need something stating that? I could fax them the discharge summary?   Hypothyroidism: TSH came back elevated at 39.015.  Free T4 0.42.  Increased the dose of Levoxyl.  Advised to follow-up outpatient with her primary care physician and have her TFTs checked in a couple of weeks and adjust accordingly.

## 2019-08-08 NOTE — Telephone Encounter (Signed)
Can you look if they actually sent me something (their message further down--I'm not at [facility] so I don't know)?  I'm guessing this is another hospital change they're wanting approved or that it came from a visit with someone else?  I have not seen her for several weeks.

## 2019-08-08 NOTE — Telephone Encounter (Signed)
Roselyn Reef with [facility] called.  She stated that they received an order on 08/02/19 to start patient on Levothyroxine 100mg .  She wants to know if you want to discontinue the 75mg , if so an order is needed.  Please call Roselyn Reef at [phone].  Thank you  [employer]

## 2019-08-09 ENCOUNTER — Telehealth: Payer: Self-pay | Admitting: *Deleted

## 2019-08-09 NOTE — Telephone Encounter (Signed)
I have made the 1st attempt to contact the patient or family member in charge, in order to follow up from recently being discharged from the hospital. I left a message on voicemail but I will make another attempt at a different time.  

## 2019-08-09 NOTE — Telephone Encounter (Signed)
Discharge summary on your desk to be signed and faxed to [facility]. Fax [phone]

## 2019-08-10 NOTE — Telephone Encounter (Signed)
Signed and put in fax bin

## 2019-08-10 NOTE — Telephone Encounter (Signed)
I have made the 2nd attempt to contact the patient or family member in charge, in order to follow up from recently being discharged from the hospital. I left a message on voicemail but I will make another attempt at a different time.  

## 2019-08-11 NOTE — Telephone Encounter (Signed)
I have made the 3rd attempt to contact the patient or family member in charge, in order to follow up from recently being discharged from the hospital.  Cannot leave message due to Voicemail being full.

## 2019-08-11 NOTE — Telephone Encounter (Signed)
Noted.  Thank you for your attempts.

## 2019-08-14 DIAGNOSIS — G894 Chronic pain syndrome: Secondary | ICD-10-CM | POA: Diagnosis not present

## 2019-08-22 DIAGNOSIS — R2681 Unsteadiness on feet: Secondary | ICD-10-CM | POA: Diagnosis not present

## 2019-08-22 DIAGNOSIS — R2689 Other abnormalities of gait and mobility: Secondary | ICD-10-CM | POA: Diagnosis not present

## 2019-08-23 ENCOUNTER — Encounter: Payer: Self-pay | Admitting: Internal Medicine

## 2019-08-23 DIAGNOSIS — N390 Urinary tract infection, site not specified: Secondary | ICD-10-CM | POA: Diagnosis not present

## 2019-08-23 DIAGNOSIS — D519 Vitamin B12 deficiency anemia, unspecified: Secondary | ICD-10-CM | POA: Diagnosis not present

## 2019-08-23 DIAGNOSIS — D649 Anemia, unspecified: Secondary | ICD-10-CM | POA: Diagnosis not present

## 2019-08-23 LAB — CBC: RBC: 3.88 (ref 3.87–5.11)

## 2019-08-23 LAB — BASIC METABOLIC PANEL
BUN: 10 (ref 4–21)
CO2: 23 — AB (ref 13–22)
Chloride: 105 (ref 99–108)
Creatinine: 0.5 (ref 0.5–1.1)
Glucose: 107
Potassium: 3.8 (ref 3.4–5.3)
Sodium: 142 (ref 137–147)

## 2019-08-23 LAB — CBC AND DIFFERENTIAL
HCT: 35 — AB (ref 36–46)
Hemoglobin: 11.5 — AB (ref 12.0–16.0)
Platelets: 179 (ref 150–399)
WBC: 3.9

## 2019-08-23 LAB — COMPREHENSIVE METABOLIC PANEL: Calcium: 9.3 (ref 8.7–10.7)

## 2019-08-24 DIAGNOSIS — R278 Other lack of coordination: Secondary | ICD-10-CM | POA: Diagnosis not present

## 2019-08-24 DIAGNOSIS — M6281 Muscle weakness (generalized): Secondary | ICD-10-CM | POA: Diagnosis not present

## 2019-08-24 DIAGNOSIS — R293 Abnormal posture: Secondary | ICD-10-CM | POA: Diagnosis not present

## 2019-08-25 ENCOUNTER — Encounter: Payer: Self-pay | Admitting: Internal Medicine

## 2019-08-25 DIAGNOSIS — R2681 Unsteadiness on feet: Secondary | ICD-10-CM | POA: Diagnosis not present

## 2019-08-25 DIAGNOSIS — R2689 Other abnormalities of gait and mobility: Secondary | ICD-10-CM | POA: Diagnosis not present

## 2019-08-28 DIAGNOSIS — R2689 Other abnormalities of gait and mobility: Secondary | ICD-10-CM | POA: Diagnosis not present

## 2019-08-28 DIAGNOSIS — R293 Abnormal posture: Secondary | ICD-10-CM | POA: Diagnosis not present

## 2019-08-28 DIAGNOSIS — R2681 Unsteadiness on feet: Secondary | ICD-10-CM | POA: Diagnosis not present

## 2019-08-28 DIAGNOSIS — M6281 Muscle weakness (generalized): Secondary | ICD-10-CM | POA: Diagnosis not present

## 2019-08-28 DIAGNOSIS — R278 Other lack of coordination: Secondary | ICD-10-CM | POA: Diagnosis not present

## 2019-08-30 DIAGNOSIS — M6281 Muscle weakness (generalized): Secondary | ICD-10-CM | POA: Diagnosis not present

## 2019-08-30 DIAGNOSIS — R2681 Unsteadiness on feet: Secondary | ICD-10-CM | POA: Diagnosis not present

## 2019-08-30 DIAGNOSIS — R293 Abnormal posture: Secondary | ICD-10-CM | POA: Diagnosis not present

## 2019-08-30 DIAGNOSIS — R2689 Other abnormalities of gait and mobility: Secondary | ICD-10-CM | POA: Diagnosis not present

## 2019-08-30 DIAGNOSIS — R278 Other lack of coordination: Secondary | ICD-10-CM | POA: Diagnosis not present

## 2019-09-01 DIAGNOSIS — R2689 Other abnormalities of gait and mobility: Secondary | ICD-10-CM | POA: Diagnosis not present

## 2019-09-01 DIAGNOSIS — R2681 Unsteadiness on feet: Secondary | ICD-10-CM | POA: Diagnosis not present

## 2019-09-04 DIAGNOSIS — R2681 Unsteadiness on feet: Secondary | ICD-10-CM | POA: Diagnosis not present

## 2019-09-04 DIAGNOSIS — R2689 Other abnormalities of gait and mobility: Secondary | ICD-10-CM | POA: Diagnosis not present

## 2019-09-05 ENCOUNTER — Telehealth: Payer: Self-pay

## 2019-09-05 NOTE — Telephone Encounter (Signed)
Max Fickle from [facility] states that patient has a swollen Left Ankle and wants to know what you advise her to do. Please Advise.

## 2019-09-06 ENCOUNTER — Encounter: Payer: Self-pay | Admitting: Family

## 2019-09-06 ENCOUNTER — Other Ambulatory Visit: Payer: Self-pay

## 2019-09-06 ENCOUNTER — Ambulatory Visit (INDEPENDENT_AMBULATORY_CARE_PROVIDER_SITE_OTHER): Payer: Medicare Other | Admitting: Family

## 2019-09-06 VITALS — BP 140/92 | HR 63 | Temp 97.7°F | Resp 16 | Ht 65.0 in | Wt 157.4 lb

## 2019-09-06 DIAGNOSIS — E039 Hypothyroidism, unspecified: Secondary | ICD-10-CM

## 2019-09-06 DIAGNOSIS — R200 Anesthesia of skin: Secondary | ICD-10-CM | POA: Diagnosis not present

## 2019-09-06 DIAGNOSIS — K5901 Slow transit constipation: Secondary | ICD-10-CM | POA: Diagnosis not present

## 2019-09-06 DIAGNOSIS — R030 Elevated blood-pressure reading, without diagnosis of hypertension: Secondary | ICD-10-CM | POA: Diagnosis not present

## 2019-09-06 DIAGNOSIS — R202 Paresthesia of skin: Secondary | ICD-10-CM

## 2019-09-06 DIAGNOSIS — R600 Localized edema: Secondary | ICD-10-CM | POA: Diagnosis not present

## 2019-09-06 DIAGNOSIS — R7309 Other abnormal glucose: Secondary | ICD-10-CM | POA: Diagnosis not present

## 2019-09-06 MED ORDER — POLYETHYLENE GLYCOL 3350 17 G PO PACK: 17.0000 g | PACK | Freq: Every day | ORAL | 0 refills | Status: DC

## 2019-09-06 MED ORDER — CLONIDINE HCL 0.1 MG PO TABS
0.1000 mg | ORAL_TABLET | Freq: Once | ORAL | Status: AC
  Administered 2019-09-06: 0.1 mg via ORAL

## 2019-09-06 MED ORDER — FUROSEMIDE 20 MG PO TABS: 20.0000 mg | ORAL_TABLET | Freq: Every morning | ORAL | 3 refills | Status: DC

## 2019-09-06 NOTE — Patient Instructions (Addendum)
1. Facility Nurse to check Blood pressure once daily and Notify Dr.Reed's office if B/p > 140/90 at Telephone # [phone]  2. Start on Furosemide 20 mg tablet one by mouth daily  3. Wear knee high Compression Stockings on in the morning and off at bedtime for leg edema 4. Keep legs elevated above heart level whenever seated  5. Check weight three time per week on Monday,wednesday and Friday.Notify Dr.Reed's office for abrupt weight gain > 3 lbs from baseline.

## 2019-09-06 NOTE — Telephone Encounter (Signed)
Waiting for reply from PCP Mariea Clonts, Tiffany L, DO .

## 2019-09-06 NOTE — Telephone Encounter (Signed)
She needs an appointment with NP asap to be evaluated and we need more info like how long it's swollen, does it hurt, did she injure it, is it red or warm, is it the whole leg or just the ankle, any other symptoms?

## 2019-09-06 NOTE — Progress Notes (Signed)
Provider: Yasmyn Bellisario FNP-C  Joy Curry, DO  Patient Care Team: Joy Curry, DO as PCP - General (Geriatric Medicine)  Extended Emergency Contact Information Primary Emergency Contact: Joy Ward of [place] Phone: [phone] Mobile Phone: [phone] Relation: Daughter Secondary Emergency Contact: Joy Ward Mobile Phone: [phone] Relation: Daughter  Code Status:  Full Code  Goals of care: Advanced Directive information Advanced Directives 09/06/2019  Does Patient Have a Medical Advance Directive? Yes  Type of Advance Directive [place]  Does patient want to make changes to medical advance directive? No - Patient declined  Copy of [place] in Chart? No - copy requested  Would patient like information on creating a medical advance directive? -     Chief Complaint  Patient presents with  . Acute Visit    Left Ankle Swelling.    HPI:  Pt is a 79 y.o. female seen today for an acute visit for evaluation of left ankle swelling.she is here with the daughter.she resides at [facility]Daughter states both legs have been swollen for the past one week.she brought in a bottle of her furosemide states since patient was admitted at the ALF she has not been taking her Furosemide 20 mg tablet daily that was prescribed by previous PCP.States Nurse at the facility told her if it's not on the medication list they cannot administered.Patient states I can keep my medication and take it by myself.Daughter shakes her head " No".Patient has a significant medical history of Dementia. She denies any cough,wheezing or shortness of breath. On chart review, she has had a 4.4 lbs weight gain over two months.Her shoes are tight today though states just wore them for visit.   Her blood pressure is high today.states feeling stressed out at the facility because of another residence who she shares a bathroom  with.States the other residence is confused and her bowels moves more often.she would like to move out.Would prefer a private room but cannot avoid.Daughter states have talked with administrator and things will be changing soon.Daughter reassured patient that they're working on the matter.Patient was emotional talking about it.Clonidine 0.1 mg tablet given with much improvement of B/P.   of note,[facility] staff attempted to reach her three times for transition of care but there was no answer and her voice mail was full. She was seen in the ED 08/06/2019 for recurrent syncope.Facility staff heard a thud and found her unconscious.she returned to her baseline in the ED.Also had Previous Hospitalization from  07/30/2019 - 08/01/2019 for syncope.EKG done showed old 1 st degree heart block.Also Had EEG,dopplers,ECHO? Arrhythmia.CT head showed no acute findings but significant for extensive chronic ischemic changes.CXR,troponins x 2 U/A were negative.she was discharged with plan for a 30 days Holter monitor to be arranged by [facility]. On her 07/30/2019 -08/01/2019 hospitalization her TSH level was 39.015 Free T4 was 0.42 Levothyroxine was increased and was advised to follow up with PCP to recheck TSH in 4 weeks. She complains of worsening numbness and tingling on left hand to wrist area,she wears disposable glove.No difficulties bending fingers.denies any pain on hand or neck area or weakness..no injuries to hand or overuse reported.  Past Medical History:  Diagnosis Date  . Allergy   . Arthritis   . Chronic back pain   . DDD (degenerative disc disease), lumbar   . Diabetes mellitus without complication ([place])   . Diabetes type 2, uncontrolled ([place]) 09/18/2006   Annotation: Noninsulin dependent Qualifier:  Diagnosis of  By: Amil Amen MD, Benjamine Mola    . Former tobacco use   . Hyperlipidemia    a. H/o muscle aches with statins per PCP notes.  . Hypertension   . Hypothyroidism    a. prior thyroid removal.  .  Memory loss or impairment 03/29/2019  . Stroke [place])    a. Listed in PCP notes: "History of stroke:  History of terrible headache.  Had a CT scan of brain and found "ministroke"  Was removed from ERT subsequently. "   Past Surgical History:  Procedure Laterality Date  . ABDOMINAL HYSTERECTOMY  1994   TAH, not clear if unilateral oophorectomy as well.  Marland Kitchen [place]  . CHOLECYSTECTOMY  1973   open  . DECOMPRESSIVE LUMBAR LAMINECTOMY LEVEL 4  2005   In [place]  . THYROIDECTOMY  2012   multinodular goiter    Allergies  Allergen Reactions  . Clindamycin Hcl Other (See Comments)    REACTION: C. difficile colitis  . Sulfamethoxazole-Trimethoprim Other (See Comments)     Eye redness  . Zanaflex [Tizanidine Hcl] Anaphylaxis  . Metronidazole Hives  . Statins Other (See Comments)    Side Effect: muscle pain Has tried Zocor, Crestor--she does not want to take a statin, period  . Bee Venom Other (See Comments)    Unknown reaction  . Ace Inhibitors Cough  . Adhesive [Tape] Itching, Rash and Other (See Comments)    Blistering of skin     Outpatient Encounter Medications as of 09/06/2019  Medication Sig  . acetaminophen (TYLENOL) 500 MG tablet Take 500 mg by mouth every 6 (six) hours as needed (fever 99.5-101, minor headache, minor discomfort).  Marland Kitchen amLODipine (NORVASC) 5 MG tablet Take 1 tablet (5 mg total) by mouth daily.  . bisacodyl (DULCOLAX) 10 MG suppository Place 1 suppository (10 mg total) rectally daily as needed for moderate constipation.  Marland Kitchen donepezil (ARICEPT) 5 MG tablet Take 1 tablet (5 mg total) by mouth at bedtime.  . furosemide (LASIX) 20 MG tablet Take 20 mg by mouth in the morning.  Marland Kitchen HYDROcodone-acetaminophen (NORCO) 10-325 MG tablet Take 1 tablet by mouth every 6 (six) hours as needed for moderate pain.   Marland Kitchen levothyroxine (SYNTHROID) 100 MCG tablet Take 1 tablet (100 mcg total) by mouth daily.  Marland Kitchen loperamide (IMODIUM) 2 MG capsule Take 2 mg by  mouth as needed for diarrhea or loose stools (do not exceed 8 dsoses in 24 hours).  . magnesium hydroxide (MILK OF MAGNESIA) 400 MG/5ML suspension Take 30 mLs by mouth at bedtime as needed (constipation).  . Omega-3 Fatty Acids (FISH OIL) 1000 MG CAPS Take 1,000 mg by mouth daily.   . polyethylene glycol (MIRALAX / GLYCOLAX) 17 g packet Take 17 g by mouth daily.  . pravastatin (PRAVACHOL) 20 MG tablet Take 1 tablet (20 mg total) by mouth daily at 6 PM.  . QUEtiapine (SEROQUEL) 100 MG tablet Take 100 mg by mouth at bedtime.   No facility-administered encounter medications on file as of 09/06/2019.    Review of Systems  Constitutional: Negative for appetite change, chills, fatigue and fever.  HENT: Negative for congestion, rhinorrhea, sinus pressure, sinus pain, sneezing and sore throat.   Eyes: Negative for discharge, redness and visual disturbance.  Respiratory: Negative for cough, chest tightness, shortness of breath and wheezing.   Cardiovascular: Positive for leg swelling. Negative for chest pain and palpitations.  Gastrointestinal: Positive for constipation. Negative for abdominal distention, abdominal pain, diarrhea, nausea and  vomiting.  Endocrine: Negative for polydipsia, polyphagia and polyuria.       TSH level elevated during recent hospitalization   Genitourinary: Negative for difficulty urinating, dysuria, flank pain, frequency and urgency.  Musculoskeletal: Positive for arthralgias.       Left hand pain and numbness uses voltaren   Skin: Negative for color change, pallor and rash.  Neurological: Negative for dizziness, speech difficulty, light-headedness and headaches.       Numbness and tingling on left hand to wrist area.wearing disposable glove on left hand to help with her symptoms.   Psychiatric/Behavioral: Negative for agitation, confusion and sleep disturbance. The patient is not nervous/anxious.     Immunization History  Administered Date(s) Administered  . Fluad  Quad(high Dose 65+) 02/06/2019  . Influenza Whole 03/08/2009  . Influenza, High Dose Seasonal PF 01/18/2017  . Influenza,inj,quad, With Preservative 01/25/2017  . Influenza-Unspecified 02/12/2016, 01/23/2017  . PFIZER SARS-COV-2 Vaccination 06/10/2019, 07/05/2019  . PPD Test 06/21/2019  . Pneumococcal Conjugate-13 02/17/2019  . Pneumococcal Polysaccharide-23 04/25/2004, 05/29/2017  . Td 04/27/2003  . Zoster 04/27/2017   Pertinent  Health Maintenance Due  Topic Date Due  . FOOT EXAM  Never done  . OPHTHALMOLOGY EXAM  Never done  . DEXA SCAN  Never done  . URINE MICROALBUMIN  03/26/2018  . INFLUENZA VACCINE  11/26/2019  . HEMOGLOBIN A1C  01/29/2020  . PNA vac Low Risk Adult  Completed   Fall Risk  09/06/2019 07/03/2019 05/22/2019 04/14/2019 03/31/2019  Falls in the past year? 1 0 1 0 0  Number falls in past yr: 1 0 0 0 -  Injury with Fall? 0 0 1 0 0    Vitals:   09/06/19 1051 09/06/19 1108  BP: (!) 190/100 (!) 180/92  Pulse: 63   Resp: 16   Temp: 97.7 F (36.5 C)   SpO2: 95%   Weight: 157 lb 6.4 oz (71.4 kg)   Height: 5' 5"  (1.651 m)    Body mass index is 26.19 kg/m. Physical Exam Vitals reviewed.  Constitutional:      General: She is not in acute distress.    Appearance: She is overweight. She is not ill-appearing.  HENT:     Head: Normocephalic.     Mouth/Throat:     Mouth: Mucous membranes are moist.     Pharynx: Oropharynx is clear. No oropharyngeal exudate or posterior oropharyngeal erythema.  Eyes:     General: No scleral icterus.       Right eye: No discharge.        Left eye: No discharge.     Extraocular Movements: Extraocular movements intact.     Conjunctiva/sclera: Conjunctivae normal.     Pupils: Pupils are equal, round, and reactive to light.  Neck:     Vascular: No carotid bruit.  Cardiovascular:     Rate and Rhythm: Normal rate.     Heart sounds: Murmur present. No friction rub. No gallop.   Pulmonary:     Effort: Pulmonary effort is normal. No  respiratory distress.     Breath sounds: Normal breath sounds. No wheezing, rhonchi or rales.  Chest:     Chest wall: No tenderness.  Abdominal:     General: Bowel sounds are normal. There is no distension.     Palpations: Abdomen is soft. There is no mass.     Tenderness: There is no abdominal tenderness. There is no right CVA tenderness, left CVA tenderness, guarding or rebound.  Musculoskeletal:  General: No swelling or tenderness. Normal range of motion.     Cervical back: Normal range of motion. No rigidity or tenderness.     Right lower leg: Edema present.     Left lower leg: Edema present.     Comments: Bilateral lower extremities 2-3+ edema   Lymphadenopathy:     Cervical: No cervical adenopathy.  Skin:    General: Skin is warm.     Coloration: Skin is not pale.     Findings: No bruising, erythema or rash.  Neurological:     Mental Status: She is alert. Mental status is at baseline.     Cranial Nerves: No cranial nerve deficit.     Motor: No weakness.  Psychiatric:        Speech: Speech normal.        Behavior: Behavior normal.        Cognition and Memory: Memory is impaired.     Comments: Tearful talking about her experience with other residence sharing a bathroom.    Labs reviewed: Recent Labs    05/03/19 0845 05/03/19 0845 05/04/19 0500 05/04/19 0500 05/05/19 0500 05/24/19 1051 08/01/19 0344 08/01/19 0344 08/06/19 1221 08/06/19 1221 08/07/19 0436 08/07/19 1404 08/23/19 0000  NA 137   < > 139   < > 138   < > 137   < > 140  --  140  --  142  K 4.2   < > 5.2*   < > 4.0   < > 3.7   < > 3.9   < > 2.9* 4.6 3.8  CL 101   < > 101   < > 101   < > 102   < > 105  --  106  --  105  CO2 25   < > 32   < > 22   < > 23   < > 24  --  24  --  23*  GLUCOSE 136*   < > 140*   < > 127*   < > 178*  --  125*  --  122*  --   --   BUN 8   < > 7*   < > 11   < > 9   < > 10  --  8  --  10  CREATININE 0.69   < > 0.87   < > 0.63   < > 0.60   < > 0.86  --  0.71  --  0.5    CALCIUM 9.2   < > 9.7   < > 9.6   < > 9.5   < > 9.6  --  9.3  --  9.3  MG 2.0   < > 2.0  --  1.8  --   --   --   --   --  1.7  --   --   PHOS 2.8  --  3.6  --  2.9  --   --   --   --   --   --   --   --    < > = values in this interval not displayed.   Recent Labs    07/23/19 0755 07/31/19 0010 08/06/19 1221  AST 19 25 21   ALT 17 21 15   ALKPHOS 58 59 55  BILITOT 0.7 0.7 0.8  PROT 6.5 6.3* 6.5  ALBUMIN 3.5 3.4* 3.5   Recent Labs    07/23/19 0757 07/23/19 0757 07/30/19 1854 07/31/19 0010 08/01/19 0344  08/01/19 0344 08/06/19 1221 08/07/19 0436 08/23/19 0000  WBC 4.4   < > 2.0*   < > 6.7   < > 6.4 5.4 3.9  NEUTROABS 1.9  --  1.2*  --   --   --  4.3  --   --   HGB 11.1*   < > 8.7*   < > 12.2   < > 12.4 11.1* 11.5*  HCT 34.6*   < > 29.1*   < > 37.9   < > 38.9 33.5* 35*  MCV 91.8   < > 100.0   < > 91.5  --  93.5 88.9  --   PLT 210   < > 64*   < > 178   < > 233 224 179   < > = values in this interval not displayed.   Lab Results  Component Value Date   TSH 39.015 (H) 07/30/2019   Lab Results  Component Value Date   HGBA1C 6.7 (H) 07/30/2019   Lab Results  Component Value Date   CHOL 226 (H) 02/06/2019   HDL 81 02/06/2019   LDLCALC 126 (H) 02/06/2019   TRIG 86 02/06/2019   CHOLHDL 2.8 02/06/2019    Significant Diagnostic Results in last 30 days:  No results found.  Assessment/Plan 1. Elevated blood pressure reading B/p elevated today though reports being upset due to her experience at her ALF.suspect due to fluid retention given her worsening edema on lower extremities.clonidine administered and blood pressure improved. - cloNIDine (CATAPRES) tablet 0.1 mg - TSH - CBC with Differential/Platelet - CMP with eGFR([facility]) - For home use only DME Other see comment: Facility Nurse to check Blood pressure once daily and Notify Dr.Reed's office if B/p > 140/90 at Telephone # [phone].Order faxed to [facility] by [place].  2. Bilateral leg edema Worsening  bilateral edema.Will restart Furosemide. - For home use only DME Other see comment:Advised wear knee high Compression Stockings on in the morning and off at bedtime for leg edema - Keep legs elevated above heart level whenever seated  - Facility Nurse check weight three time per week on Monday,wednesday and Friday.Notify Dr.Reed's office for abrupt weight gain > 3 lbs from baseline.  - furosemide (LASIX) 20 MG tablet; Take 1 tablet (20 mg total) by mouth in the morning.  Dispense: 30 tablet; Refill: 3 - Compression stockings script given to daughter   3. Hypothyroidism, unspecified type Lab Results  Component Value Date   TSH 39.015 (H) 07/30/2019  Levothyroxine increased during hospitalization.will recheck TSH level today. - continue on levothyroxine 100 mcg tablet daily. - TSH  4. Slow transit constipation Has been taking miralax as needed.Order faxed to facility to change Miralax 17 gm to daily.  - polyethylene glycol (MIRALAX / GLYCOLAX) 17 g packet; Take 17 g by mouth daily.  Dispense: 14 each; Refill: 0  5. Numbness and tingling of left hand Reports worsening numbness and tingling on hands to wrist area.Negative exam findings.Denies neck pain.wearing disposable glove.Discussed referral to Neurologist for evaluation.  - Ambulatory referral to Neurology  Family/ staff Communication: Reviewed plan of care with patient and daughter verbalized understanding.  Labs/tests ordered:   - TSH - CBC with Differential/Platelet - CMP with eGFR([facility]) - TSH Next Appointment: 2 weeks for follow up bilateral leg edema.   Joy Hughs, Joy Ward

## 2019-09-06 NOTE — Telephone Encounter (Signed)
Patient Left ankle has been swollen for 2 days. [facility] states that they have been propping ankle up with ice and it has went down. Left Ankle hurts when it does swell, patient didn't have an injury to ankle, when ankle is swollen it's not red or warm, only the left ankle swells not the whole left leg, and no further symptoms. I informed [facility] Nurse that patient should have appointment today. Patient is scheduled to see Ngetich, Dinah, NP at 11am this morning 09/06/2019.

## 2019-09-07 ENCOUNTER — Ambulatory Visit: Payer: Medicare Other | Admitting: Internal Medicine

## 2019-09-09 LAB — CBC WITH DIFFERENTIAL/PLATELET
Absolute Monocytes: 314 cells/uL (ref 200–950)
Basophils Absolute: 52 cells/uL (ref 0–200)
Basophils Relative: 1.2 %
Eosinophils Absolute: 69 cells/uL (ref 15–500)
Eosinophils Relative: 1.6 %
HCT: 38.2 % (ref 35.0–45.0)
Hemoglobin: 12.7 g/dL (ref 11.7–15.5)
Lymphs Abs: 1355 cells/uL (ref 850–3900)
MCH: 29.6 pg (ref 27.0–33.0)
MCHC: 33.2 g/dL (ref 32.0–36.0)
MCV: 89 fL (ref 80.0–100.0)
MPV: 12.2 fL (ref 7.5–12.5)
Monocytes Relative: 7.3 %
Neutro Abs: 2511 cells/uL (ref 1500–7800)
Neutrophils Relative %: 58.4 %
Platelets: 227 10*3/uL (ref 140–400)
RBC: 4.29 10*6/uL (ref 3.80–5.10)
RDW: 12.6 % (ref 11.0–15.0)
Total Lymphocyte: 31.5 %
WBC: 4.3 10*3/uL (ref 3.8–10.8)

## 2019-09-09 LAB — COMPLETE METABOLIC PANEL WITH GFR
AG Ratio: 1.6 (calc) (ref 1.0–2.5)
ALT: 12 U/L (ref 6–29)
AST: 20 U/L (ref 10–35)
Albumin: 4.4 g/dL (ref 3.6–5.1)
Alkaline phosphatase (APISO): 78 U/L (ref 37–153)
BUN: 9 mg/dL (ref 7–25)
CO2: 24 mmol/L (ref 20–32)
Calcium: 10 mg/dL (ref 8.6–10.4)
Chloride: 104 mmol/L (ref 98–110)
Creat: 0.66 mg/dL (ref 0.60–0.93)
GFR, Est African American: 99 mL/min/{1.73_m2} (ref >=60)
GFR, Est Non African American: 85 mL/min/{1.73_m2} (ref >=60)
Globulin: 2.7 g/dL (calc) (ref 1.9–3.7)
Glucose, Bld: 142 mg/dL — ABNORMAL HIGH (ref 65–139)
Potassium: 4.2 mmol/L (ref 3.5–5.3)
Sodium: 139 mmol/L (ref 135–146)
Total Bilirubin: 0.5 mg/dL (ref 0.2–1.2)
Total Protein: 7.1 g/dL (ref 6.1–8.1)

## 2019-09-09 LAB — TEST AUTHORIZATION

## 2019-09-09 LAB — TSH: TSH: 4.1 mIU/L (ref 0.40–4.50)

## 2019-09-09 LAB — HEMOGLOBIN A1C W/OUT EAG: Hgb A1c MFr Bld: 6.5 % of total Hgb — ABNORMAL HIGH (ref <5.7)

## 2019-09-12 NOTE — Telephone Encounter (Signed)
Okay.discuss labs with Dr.Reed.

## 2019-09-12 NOTE — Telephone Encounter (Signed)
Patient called and labs discussed with daughter. Patient daughter states that she will come to appointment with Dr.Reed 09/21/2019. Patient daughter states during that visit she will see if Dr.Reed her PCP would like lab repeated. Due to just being seen and having lab work done. Patient daughter lastly states that her mother is 41 and she's not going to workout for 78min for 3 times daily.

## 2019-09-17 ENCOUNTER — Other Ambulatory Visit: Payer: Self-pay

## 2019-09-17 ENCOUNTER — Emergency Department (HOSPITAL_COMMUNITY)
Admission: EM | Admit: 2019-09-17 | Discharge: 2019-09-17 | Disposition: A | Discharge status: Home / Self Care | Payer: Medicare Other | Attending: Emergency Medicine | Admitting: Emergency Medicine

## 2019-09-17 ENCOUNTER — Encounter (HOSPITAL_COMMUNITY): Payer: Self-pay

## 2019-09-17 DIAGNOSIS — E039 Hypothyroidism, unspecified: Secondary | ICD-10-CM | POA: Diagnosis not present

## 2019-09-17 DIAGNOSIS — Z8673 Personal history of transient ischemic attack (TIA), and cerebral infarction without residual deficits: Secondary | ICD-10-CM | POA: Diagnosis not present

## 2019-09-17 DIAGNOSIS — R300 Dysuria: Secondary | ICD-10-CM

## 2019-09-17 DIAGNOSIS — R32 Unspecified urinary incontinence: Secondary | ICD-10-CM | POA: Diagnosis not present

## 2019-09-17 DIAGNOSIS — Z87891 Personal history of nicotine dependence: Secondary | ICD-10-CM | POA: Diagnosis not present

## 2019-09-17 DIAGNOSIS — E119 Type 2 diabetes mellitus without complications: Secondary | ICD-10-CM | POA: Insufficient documentation

## 2019-09-17 DIAGNOSIS — R4182 Altered mental status, unspecified: Secondary | ICD-10-CM | POA: Diagnosis not present

## 2019-09-17 DIAGNOSIS — Z79899 Other long term (current) drug therapy: Secondary | ICD-10-CM | POA: Insufficient documentation

## 2019-09-17 DIAGNOSIS — I10 Essential (primary) hypertension: Secondary | ICD-10-CM | POA: Diagnosis not present

## 2019-09-17 DIAGNOSIS — R350 Frequency of micturition: Secondary | ICD-10-CM

## 2019-09-17 DIAGNOSIS — Z7401 Bed confinement status: Secondary | ICD-10-CM | POA: Diagnosis not present

## 2019-09-17 DIAGNOSIS — M2550 Pain in unspecified joint: Secondary | ICD-10-CM | POA: Diagnosis not present

## 2019-09-17 DIAGNOSIS — R52 Pain, unspecified: Secondary | ICD-10-CM | POA: Diagnosis not present

## 2019-09-17 LAB — URINALYSIS, ROUTINE W REFLEX MICROSCOPIC
Bilirubin Urine: NEGATIVE
Glucose, UA: NEGATIVE mg/dL
Hgb urine dipstick: NEGATIVE
Ketones, ur: NEGATIVE mg/dL
Leukocytes,Ua: NEGATIVE
Nitrite: NEGATIVE
Protein, ur: NEGATIVE mg/dL
Specific Gravity, Urine: 1.01 (ref 1.005–1.030)
pH: 7 (ref 5.0–8.0)

## 2019-09-17 LAB — COMPREHENSIVE METABOLIC PANEL
ALT: 12 U/L (ref 0–44)
AST: 20 U/L (ref 15–41)
Albumin: 4.1 g/dL (ref 3.5–5.0)
Alkaline Phosphatase: 69 U/L (ref 38–126)
Anion gap: 11 (ref 5–15)
BUN: 10 mg/dL (ref 8–23)
CO2: 24 mmol/L (ref 22–32)
Calcium: 9.5 mg/dL (ref 8.9–10.3)
Chloride: 105 mmol/L (ref 98–111)
Creatinine, Ser: 0.53 mg/dL (ref 0.44–1.00)
GFR calc Af Amer: >60 mL/min (ref >60)
GFR calc non Af Amer: >60 mL/min (ref >60)
Glucose, Bld: 128 mg/dL — ABNORMAL HIGH (ref 70–99)
Potassium: 3.7 mmol/L (ref 3.5–5.1)
Sodium: 140 mmol/L (ref 135–145)
Total Bilirubin: 0.6 mg/dL (ref 0.3–1.2)
Total Protein: 6.9 g/dL (ref 6.5–8.1)

## 2019-09-17 LAB — CBC WITH DIFFERENTIAL/PLATELET
Abs Immature Granulocytes: 0.01 10*3/uL (ref 0.00–0.07)
Basophils Absolute: 0 10*3/uL (ref 0.0–0.1)
Basophils Relative: 1 %
Eosinophils Absolute: 0.2 10*3/uL (ref 0.0–0.5)
Eosinophils Relative: 6 %
HCT: 40.7 % (ref 36.0–46.0)
Hemoglobin: 13.2 g/dL (ref 12.0–15.0)
Immature Granulocytes: 0 %
Lymphocytes Relative: 55 %
Lymphs Abs: 2 10*3/uL (ref 0.7–4.0)
MCH: 29.6 pg (ref 26.0–34.0)
MCHC: 32.4 g/dL (ref 30.0–36.0)
MCV: 91.3 fL (ref 80.0–100.0)
Monocytes Absolute: 0.3 10*3/uL (ref 0.1–1.0)
Monocytes Relative: 8 %
Neutro Abs: 1.1 10*3/uL — ABNORMAL LOW (ref 1.7–7.7)
Neutrophils Relative %: 30 %
Platelets: 181 10*3/uL (ref 150–400)
RBC: 4.46 MIL/uL (ref 3.87–5.11)
RDW: 12.6 % (ref 11.5–15.5)
WBC: 3.7 10*3/uL — ABNORMAL LOW (ref 4.0–10.5)
nRBC: 0 % (ref 0.0–0.2)

## 2019-09-17 LAB — CBG MONITORING, ED: Glucose-Capillary: 125 mg/dL — ABNORMAL HIGH (ref 70–99)

## 2019-09-17 NOTE — ED Provider Notes (Signed)
TIME SEEN: 5:42 AM  CHIEF COMPLAINT: "I think I have a UTI"  HPI: Patient is a 79 year old female with history of frequent UTIs, hypertension, diabetes, hyperlipidemia, hypothyroidism, previous stroke, ?  Dementia who presents to the emergency department with EMS for concerns for UTI.  Reports having dysuria and wetting the bed tonight.  Lives at [facility].  Denies fevers, abdominal pain, back pain, nausea, vomiting or diarrhea.  Spoke to patient's nurse by phone who states she was saying patient was complaining of wetting herself and dysuria.  She does not report that the patient was more confused.  She states that the patient has a history of UTIs.  She states the patient asked to come to the hospital and she called the patient's daughter who was told them to send her if the patient wanted to come.  ROS: See HPI Constitutional: no fever  Eyes: no drainage  ENT: no runny nose   Cardiovascular:  no chest pain  Resp: no SOB  GI: no vomiting GU: no dysuria Integumentary: no rash  Allergy: no hives  Musculoskeletal: no leg swelling  Neurological: no slurred speech ROS otherwise negative  PAST MEDICAL HISTORY/PAST SURGICAL HISTORY:  Past Medical History:  Diagnosis Date  . Allergy   . Arthritis   . Chronic back pain   . DDD (degenerative disc disease), lumbar   . Diabetes mellitus without complication ([place])   . Diabetes type 2, uncontrolled ([place]) 09/18/2006   Annotation: Noninsulin dependent Qualifier: Diagnosis of  By: Amil Amen MD, Benjamine Mola    . Former tobacco use   . Hyperlipidemia    a. H/o muscle aches with statins per PCP notes.  . Hypertension   . Hypothyroidism    a. prior thyroid removal.  . Memory loss or impairment 03/29/2019  . Stroke [facility])    a. Listed in PCP notes: "History of stroke:  History of terrible headache.  Had a CT scan of brain and found "ministroke"  Was removed from ERT subsequently. "    MEDICATIONS:  Prior to Admission medications   Medication  Sig Start Date End Date Taking? Authorizing Provider  acetaminophen (TYLENOL) 500 MG tablet Take 500 mg by mouth every 6 (six) hours as needed (fever 99.5-101, minor headache, minor discomfort).    [provider]  amLODipine (NORVASC) 5 MG tablet Take 1 tablet (5 mg total) by mouth daily. 08/02/19   Reed, Tiffany L, DO  bisacodyl (DULCOLAX) 10 MG suppository Place 1 suppository (10 mg total) rectally daily as needed for moderate constipation. 06/28/19   Pokhrel, Corrie Mckusick, MD  donepezil (ARICEPT) 5 MG tablet Take 1 tablet (5 mg total) by mouth at bedtime. 08/02/19   Reed, Tiffany L, DO  furosemide (LASIX) 20 MG tablet Take 1 tablet (20 mg total) by mouth in the morning. 09/06/19   Ngetich, Dinah C, NP  HYDROcodone-acetaminophen (NORCO) 10-325 MG tablet Take 1 tablet by mouth every 6 (six) hours as needed for moderate pain.     [provider]  levothyroxine (SYNTHROID) 100 MCG tablet Take 1 tablet (100 mcg total) by mouth daily. 08/02/19 08/01/20  Reed, Tiffany L, DO  loperamide (IMODIUM) 2 MG capsule Take 2 mg by mouth as needed for diarrhea or loose stools (do not exceed 8 dsoses in 24 hours).    [provider]  magnesium hydroxide (MILK OF MAGNESIA) 400 MG/5ML suspension Take 30 mLs by mouth at bedtime as needed (constipation). 08/01/19   Yaakov Guthrie, MD  Omega-3 Fatty Acids (FISH OIL) 1000 MG  CAPS Take 1,000 mg by mouth daily.     [provider]  polyethylene glycol (MIRALAX / GLYCOLAX) 17 g packet Take 17 g by mouth daily. 09/06/19   Ngetich, Dinah C, NP  pravastatin (PRAVACHOL) 20 MG tablet Take 1 tablet (20 mg total) by mouth daily at 6 PM. 08/02/19   Reed, Tiffany L, DO  QUEtiapine (SEROQUEL) 100 MG tablet Take 100 mg by mouth at bedtime.    [provider]    ALLERGIES:  Allergies  Allergen Reactions  . Clindamycin Hcl Other (See Comments)    REACTION: C. difficile colitis  . Sulfamethoxazole-Trimethoprim Other (See Comments)     Eye redness  .  Zanaflex [Tizanidine Hcl] Anaphylaxis  . Metronidazole Hives  . Statins Other (See Comments)    Side Effect: muscle pain Has tried Zocor, Crestor--she does not want to take a statin, period  . Bee Venom Other (See Comments)    Unknown reaction  . Ace Inhibitors Cough  . Adhesive [Tape] Itching, Rash and Other (See Comments)    Blistering of skin     SOCIAL HISTORY:  Social History   Tobacco Use  . Smoking status: Former Research scientist (life sciences)  . Smokeless tobacco: Never Used  . Tobacco comment: No smoking since 79 yo - smoked 10 yrs  Substance Use Topics  . Alcohol use: No    FAMILY HISTORY: Family History  Problem Relation Age of Onset  . Heart disease Mother        unclear details "atherosclerosis"  . Diabetes Mother   . Alcohol abuse Sister   . Diabetes Brother   . Diabetes Brother   . Drug abuse Brother   . Diabetes Daughter   . Heart disease Son        CHF  . Alcohol abuse Son     EXAM: BP (!) 149/74   Pulse 63   Temp 97.6 F (36.4 C) (Oral)   Ht 5\' 5"  (1.651 m)   SpO2 99%   BMI 26.19 kg/m  CONSTITUTIONAL: Alert and oriented x4 and responds appropriately to questions. Well-appearing; well-nourished, elderly, afebrile, no distress HEAD: Normocephalic EYES: Conjunctivae clear, pupils appear equal, EOM appear intact ENT: normal nose; moist mucous membranes NECK: Supple, normal ROM CARD: RRR; S1 and S2 appreciated; no murmurs, no clicks, no rubs, no gallops RESP: Normal chest excursion without splinting or tachypnea; breath sounds clear and equal bilaterally; no wheezes, no rhonchi, no rales, no hypoxia or respiratory distress, speaking full sentences ABD/GI: Normal bowel sounds; non-distended; soft, non-tender, no rebound, no guarding, no peritoneal signs, no hepatosplenomegaly BACK:  The back appears normal, kyphosis noted EXT: Normal ROM in all joints; no deformity noted, no edema; no cyanosis SKIN: Normal color for age and race; warm; no rash on exposed skin NEURO:  Moves all extremities equally, normal speech, ambulates without difficulty PSYCH: The patient's mood and manner are appropriate.   MEDICAL DECISION MAKING: Patient here with concerns for urinary tract infection.  Will obtain urinalysis, urine culture.  She is well-appearing here without signs of sepsis.  No complaints of fever, vomiting, diarrhea.  She denies any pain to me at this time other than pain with urination.  Abdominal exam benign.  She appears to be at her baseline at this time.  She has been admitted before for altered mental status in the setting of UTI but does not appear acutely encephalopathic today.  ED PROGRESS: Patient's urine has been reviewed/interpreted and does not appear infected today.  Culture is pending.  Labs are pending.  Signed out to oncoming ED physician to follow-up on these results but anticipate if no significant abnormality patient will be discharged back to her nursing facility.   I reviewed all nursing notes and pertinent previous records as available.  I have reviewed and interpreted any EKGs, lab and urine results, imaging (as available).      Derotha Scarbro was evaluated in Emergency Department on 09/17/2019 for the symptoms described in the history of present illness. She was evaluated in the context of the global COVID-19 pandemic, which necessitated consideration that the patient might be at risk for infection with the SARS-CoV-2 virus that causes COVID-19. Institutional protocols and algorithms that pertain to the evaluation of patients at risk for COVID-19 are in a state of rapid change based on information released by regulatory bodies including the CDC and federal and state organizations. These policies and algorithms were followed during the patient's care in the ED.      Mae Denunzio, Delice Bison, DO 09/17/19 [phone]

## 2019-09-17 NOTE — ED Notes (Signed)
Pt ambulated to restroom with stand by assistance unable to provide urine sample at this tome.

## 2019-09-17 NOTE — ED Notes (Signed)
unsuccessful Iv/blood draw attempt x2.

## 2019-09-17 NOTE — ED Triage Notes (Signed)
BIB EMS from [facility].  Per Staff pt has been complaing of UTI symptoms including painful urination and frequency. Staff also reported to EMS pt is "more confused than normal". Pt states she is also having pain in her bottom. PT denies abd pain and states her hip is also hurting. PT is a/o. PT did state "my memory isnt very good"

## 2019-09-17 NOTE — ED Provider Notes (Signed)
Signout from Dr. Leonides Schanz.  79 year old female from facility with history of frequent UTIs here with staff concern for another UTI due to her having dysuria and incontinence.  Urinalysis did not show an obvious infection.  She is pending labs.  If her work-up is unremarkable she is okay to be returned back to her facility. Physical Exam  BP (!) 155/79   Pulse 60   Temp 97.6 F (36.4 C) (Oral)   Resp 16   Ht 5\' 5"  (1.651 m)   SpO2 100%   BMI 26.19 kg/m   Physical Exam  ED Course/Procedures     Procedures  MDM  I reviewed the results with the patient.  She says she is aware that she needs to be but cannot get to the bathroom in time.  Denies any pain.  Her work-up did not come up with an obvious explanation for her symptoms.  Will refer on to urology.       Hayden Rasmussen, MD 09/17/19 [facility]

## 2019-09-17 NOTE — ED Notes (Signed)
Reported multiple attempts tro draw labs unsuccessful. Phlebotomy notified will attempt lab draw as soon as possible

## 2019-09-17 NOTE — Discharge Instructions (Addendum)
Your urine showed no sign of infection today.  A urine culture has been sent and if it grows bacteria, you will be contacted.  Please contact urology for an appointment.  Return to the emergency department for any worsening or concerning symptoms

## 2019-09-18 LAB — URINE CULTURE: Culture: NO GROWTH

## 2019-09-21 ENCOUNTER — Ambulatory Visit: Payer: Medicare Other | Admitting: Internal Medicine

## 2019-09-28 ENCOUNTER — Ambulatory Visit
Admission: RE | Admit: 2019-09-28 | Discharge: 2019-09-28 | Disposition: A | Discharge status: Home / Self Care | Payer: Medicare Other | Source: Ambulatory Visit | Attending: Family | Admitting: Family

## 2019-09-28 ENCOUNTER — Ambulatory Visit
Admission: RE | Admit: 2019-09-28 | Discharge: 2019-09-28 | Disposition: A | Discharge status: Home / Self Care | Payer: Medicare Other | Source: Ambulatory Visit | Attending: Internal Medicine | Admitting: Internal Medicine

## 2019-09-28 ENCOUNTER — Other Ambulatory Visit: Payer: Self-pay

## 2019-09-28 DIAGNOSIS — Z780 Asymptomatic menopausal state: Secondary | ICD-10-CM | POA: Diagnosis not present

## 2019-09-28 DIAGNOSIS — Z1231 Encounter for screening mammogram for malignant neoplasm of breast: Secondary | ICD-10-CM | POA: Diagnosis not present

## 2019-10-02 ENCOUNTER — Other Ambulatory Visit: Payer: Self-pay

## 2019-10-02 MED ORDER — CALCIUM-VITAMIN D 600-200 MG-UNIT PO TABS: 1.0000 | ORAL_TABLET | Freq: Every day | ORAL | 12 refills | Status: DC

## 2019-10-09 ENCOUNTER — Other Ambulatory Visit: Payer: Self-pay | Admitting: *Deleted

## 2019-10-09 MED ORDER — LEVOTHYROXINE SODIUM 100 MCG PO TABS: 100.0000 ug | ORAL_TABLET | Freq: Every day | ORAL | 0 refills | Status: DC

## 2019-10-09 NOTE — Telephone Encounter (Signed)
Received fax from [facility].

## 2019-10-17 ENCOUNTER — Other Ambulatory Visit: Payer: Self-pay

## 2019-10-18 ENCOUNTER — Telehealth: Payer: Self-pay | Admitting: *Deleted

## 2019-10-18 NOTE — Telephone Encounter (Signed)
Will need in office follow up for edema.she was supposed to follow up 2 weeks after 09/06/2019 visit.

## 2019-10-18 NOTE — Telephone Encounter (Signed)
[name], daughter called and stated that patient was seen on 09/06/19 for Leg and ankle swelling and given Furosemide. Daughter stated that the swelling is no better. Patient is taking medication as directed. No other symptoms noted. Please Advise.

## 2019-10-19 ENCOUNTER — Telehealth: Payer: Self-pay | Admitting: Internal Medicine

## 2019-10-19 NOTE — Telephone Encounter (Signed)
Daughter, [name] scheduled an appointment for 10/20/19.

## 2019-10-19 NOTE — Telephone Encounter (Signed)
Has appointment in the morning with me.will evaluate swelling then adjust furosemide if indicated.

## 2019-10-19 NOTE — Telephone Encounter (Signed)
pts daughter(Angel) called to say that she needs a stronger fluid pill due to swelling in lower extremites.  Pt has appt 6/25 & may forget to mention it at appt.  Daughter is working on getting pt moved closer to her in [place]. She will be having new facilities send release of records to [place] & may need referrals as well.  Thanks, Vilinda Blanks.

## 2019-10-20 ENCOUNTER — Encounter: Payer: Self-pay | Admitting: Family

## 2019-10-20 ENCOUNTER — Other Ambulatory Visit: Payer: Self-pay

## 2019-10-20 ENCOUNTER — Ambulatory Visit (INDEPENDENT_AMBULATORY_CARE_PROVIDER_SITE_OTHER): Payer: Medicare Other | Admitting: Family

## 2019-10-20 VITALS — BP 156/96 | HR 72 | Temp 97.1°F | Resp 16 | Ht 65.0 in | Wt 156.0 lb

## 2019-10-20 DIAGNOSIS — J3089 Other allergic rhinitis: Secondary | ICD-10-CM

## 2019-10-20 DIAGNOSIS — R600 Localized edema: Secondary | ICD-10-CM | POA: Diagnosis not present

## 2019-10-20 DIAGNOSIS — R399 Unspecified symptoms and signs involving the genitourinary system: Secondary | ICD-10-CM | POA: Diagnosis not present

## 2019-10-20 DIAGNOSIS — I10 Essential (primary) hypertension: Secondary | ICD-10-CM

## 2019-10-20 LAB — POCT URINALYSIS DIPSTICK
Bilirubin, UA: POSITIVE
Blood, UA: POSITIVE
Glucose, UA: NEGATIVE
Ketones, UA: NEGATIVE
Nitrite, UA: NEGATIVE
Protein, UA: NEGATIVE
Spec Grav, UA: 1.02 (ref 1.010–1.025)
Urobilinogen, UA: NEGATIVE E.U./dL — AB
pH, UA: 7 (ref 5.0–8.0)

## 2019-10-20 MED ORDER — FUROSEMIDE 20 MG PO TABS: 40.0000 mg | ORAL_TABLET | Freq: Every day | ORAL | 3 refills | Status: DC

## 2019-10-20 MED ORDER — LORATADINE 10 MG PO TABS: 10.0000 mg | ORAL_TABLET | Freq: Every day | ORAL | 11 refills | Status: DC

## 2019-10-20 MED ORDER — POTASSIUM CHLORIDE CRYS ER 20 MEQ PO TBCR: 20.0000 meq | EXTENDED_RELEASE_TABLET | Freq: Every day | ORAL | 3 refills | Status: DC

## 2019-10-20 NOTE — Patient Instructions (Signed)
-   discontinue Furosemide 20 mg tablet then start Furosemide 40 mg tablet one by mouth daily for leg swelling  - Take Potassium chloride 20 meq Tablet one by mouth daily  - Wear Knee high Compression stocking in the morning and take them off at bedtime  - Keep legs elevated when seated   Edema  Edema is when you have too much fluid in your body or under your skin. Edema may make your legs, feet, and ankles swell up. Swelling is also common in looser tissues, like around your eyes. This is a common condition. It gets more common as you get older. There are many possible causes of edema. Eating too much salt (sodium) and being on your feet or sitting for a long time can cause edema in your legs, feet, and ankles. Hot weather may make edema worse. Edema is usually painless. Your skin may look swollen or shiny. Follow these instructions at home:  Keep the swollen body part raised (elevated) above the level of your heart when you are sitting or lying down.  Do not sit still or stand for a long time.  Do not wear tight clothes. Do not wear garters on your upper legs.  Exercise your legs. This can help the swelling go down.  Wear elastic bandages or support stockings as told by your doctor.  Eat a low-salt (low-sodium) diet to reduce fluid as told by your doctor.  Depending on the cause of your swelling, you may need to limit how much fluid you drink (fluid restriction).  Take over-the-counter and prescription medicines only as told by your doctor. Contact a doctor if:  Treatment is not working.  You have heart, liver, or kidney disease and have symptoms of edema.  You have sudden and unexplained weight gain. Get help right away if:  You have shortness of breath or chest pain.  You cannot breathe when you lie down.  You have pain, redness, or warmth in the swollen areas.  You have heart, liver, or kidney disease and get edema all of a sudden.  You have a fever and your symptoms  get worse all of a sudden. Summary  Edema is when you have too much fluid in your body or under your skin.  Edema may make your legs, feet, and ankles swell up. Swelling is also common in looser tissues, like around your eyes.  Raise (elevate) the swollen body part above the level of your heart when you are sitting or lying down.  Follow your doctor's instructions about diet and how much fluid you can drink (fluid restriction). This information is not intended to replace advice given to you by your health care provider. Make sure you discuss any questions you have with your health care provider. Document Revised: 04/16/2017 Document Reviewed: 05/01/2016 Elsevier Patient Education  2020 [employer].

## 2019-10-20 NOTE — Progress Notes (Signed)
Provider: Letty Salvi FNP-C  Gayland Curry, DO  Patient Care Team: Gayland Curry, DO as PCP - General (Geriatric Medicine)  Extended Emergency Contact Information Primary Emergency Contact: [name] of [place] Phone: [phone] Mobile Phone: [phone] Relation: Daughter Secondary Emergency Contact: PETERS,ANGELA Mobile Phone: [phone] Relation: Daughter  Code Status: Full Code  Goals of care: Advanced Directive information Advanced Directives 10/20/2019  Does Patient Have a Medical Advance Directive? Yes  Type of Advance Directive [place]  Does patient want to make changes to medical advance directive? No - Patient declined  Copy of [place] in Chart? No - copy requested  Would patient like information on creating a medical advance directive? -     Chief Complaint  Patient presents with  . Medical Management of Chronic Issues    Follow up on Edema, needing stronger fluid pill.    HPI:  Pt is a 79 y.o. female seen today for an acute visit for follow up on lower extremities edema.Request stronger fluid pill.she is here with her care giver states daughter not available for visit today. states leg swelling has worsen.She takes Furosemide 20 mg tablet daily administered at the facility.she does not wear compression stockings.She has had no abrupt weight gain.she denies any cough,Orthopena,shortness of breath or wheezing.  Also request urine checked for Urinary tract infection.States the resident who shares same bathroom gets UTI a lot.she tries to wipe seat prior to using.Has had frequent voiding and urgency.Has back pain but it's chronic due to previous accident.She denies any fever,chills,abdominal pain,nausea or vomiting.she does drink water throughout the day.  Also request a pill to help with her runny nose.denies any itchy eyes,sinus pressure or pain,cough or nasal congestion.No loss of smell or  taste.    Past Medical History:  Diagnosis Date  . Allergy   . Arthritis   . Chronic back pain   . DDD (degenerative disc disease), lumbar   . Diabetes mellitus without complication ([place])   . Diabetes type 2, uncontrolled ([place]) 09/18/2006   Annotation: Noninsulin dependent Qualifier: Diagnosis of  By: Amil Amen MD, Benjamine Mola    . Former tobacco use   . Hyperlipidemia    a. H/o muscle aches with statins per PCP notes.  . Hypertension   . Hypothyroidism    a. prior thyroid removal.  . Memory loss or impairment 03/29/2019  . Stroke [facility])    a. Listed in PCP notes: "History of stroke:  History of terrible headache.  Had a CT scan of brain and found "ministroke"  Was removed from ERT subsequently. "   Past Surgical History:  Procedure Laterality Date  . ABDOMINAL HYSTERECTOMY  1994   TAH, not clear if unilateral oophorectomy as well.  Marland Kitchen [place]  . CHOLECYSTECTOMY  1973   open  . DECOMPRESSIVE LUMBAR LAMINECTOMY LEVEL 4  2005   In [place]  . THYROIDECTOMY  2012   multinodular goiter    Allergies  Allergen Reactions  . Clindamycin Hcl Other (See Comments)    REACTION: C. difficile colitis  . Sulfamethoxazole-Trimethoprim Other (See Comments)     Eye redness  . Zanaflex [Tizanidine Hcl] Anaphylaxis  . Metronidazole Hives  . Statins Other (See Comments)    Side Effect: muscle pain Has tried Zocor, Crestor--she does not want to take a statin, period  . Bee Venom Other (See Comments)    Unknown reaction  . Ace Inhibitors Cough  . Adhesive [Tape] Itching,  Rash and Other (See Comments)    Blistering of skin     Outpatient Encounter Medications as of 10/20/2019  Medication Sig  . acetaminophen (TYLENOL) 325 MG tablet Take 650 mg by mouth every 6 (six) hours as needed.  Marland Kitchen acetaminophen (TYLENOL) 500 MG tablet Take 500 mg by mouth every 6 (six) hours as needed (fever 99.5-101, minor headache, minor discomfort).  Marland Kitchen amLODipine (NORVASC) 5 MG tablet  Take 1 tablet (5 mg total) by mouth daily.  . bisacodyl (DULCOLAX) 10 MG suppository Place 1 suppository (10 mg total) rectally daily as needed for moderate constipation.  . Calcium-Vitamin D 600-200 MG-UNIT tablet Take 1 tablet by mouth daily.  . diclofenac Sodium (VOLTAREN) 1 % GEL Apply 4 g topically in the morning, at noon, and at bedtime.  . diclofenac Sodium (VOLTAREN) 1 % GEL Apply 4 g topically in the morning and at bedtime.  . donepezil (ARICEPT) 5 MG tablet Take 1 tablet (5 mg total) by mouth at bedtime.  . furosemide (LASIX) 20 MG tablet Take 1 tablet (20 mg total) by mouth in the morning.  Marland Kitchen guaifenesin (ROBITUSSIN) 100 MG/5ML syrup Take 200 mg by mouth every 6 (six) hours as needed for cough.  Marland Kitchen HYDROcodone-acetaminophen (NORCO) 10-325 MG tablet Take 1 tablet by mouth every 6 (six) hours as needed for moderate pain.   Marland Kitchen levothyroxine (SYNTHROID) 100 MCG tablet Take 1 tablet (100 mcg total) by mouth daily.  Marland Kitchen loperamide (IMODIUM) 2 MG capsule Take 2 mg by mouth as needed for diarrhea or loose stools (do not exceed 8 dsoses in 24 hours).  . magnesium hydroxide (MILK OF MAGNESIA) 400 MG/5ML suspension Take 30 mLs by mouth at bedtime as needed (constipation).  . metFORMIN (GLUCOPHAGE) 500 MG tablet Take 500 mg by mouth daily.  . metoprolol tartrate (LOPRESSOR) 25 MG tablet Take 12.5 mg by mouth 2 (two) times daily.  Marland Kitchen Neomycin-Bacitracin-Polymyxin (TRIPLE ANTIBIOTIC) [phone] OINT Apply topically as needed.  . Omega-3 Fatty Acids (FISH OIL) 1000 MG CAPS Take 1,000 mg by mouth daily.   . polyethylene glycol (MIRALAX / GLYCOLAX) 17 g packet Take 17 g by mouth daily.  . pravastatin (PRAVACHOL) 20 MG tablet Take 1 tablet (20 mg total) by mouth daily at 6 PM.  . QUEtiapine (SEROQUEL) 100 MG tablet Take 100 mg by mouth at bedtime.  . traZODone (DESYREL) 100 MG tablet Take 100 mg by mouth at bedtime.  . [DISCONTINUED] QUEtiapine (SEROQUEL) 25 MG tablet Take 25 mg by mouth at bedtime.   No  facility-administered encounter medications on file as of 10/20/2019.    Review of Systems  Constitutional: Negative for appetite change, chills, fatigue and fever.  HENT: Positive for rhinorrhea. Negative for congestion, sinus pressure, sinus pain, sneezing and sore throat.   Eyes: Negative for discharge, redness, itching and visual disturbance.  Respiratory: Negative for cough, chest tightness, shortness of breath and wheezing.   Cardiovascular: Positive for leg swelling. Negative for chest pain and palpitations.  Gastrointestinal: Negative for abdominal distention, abdominal pain, constipation, diarrhea, nausea and vomiting.  Genitourinary: Positive for frequency and urgency. Negative for decreased urine volume, difficulty urinating, dysuria and flank pain.  Musculoskeletal: Positive for arthralgias. Negative for joint swelling.  Neurological: Negative for dizziness, speech difficulty, weakness, light-headedness, numbness and headaches.  Hematological: Does not bruise/bleed easily.  Psychiatric/Behavioral: Negative for agitation, behavioral problems and sleep disturbance. The patient is not nervous/anxious.     Immunization History  Administered Date(s) Administered  . Fluad Quad(high Dose 65+) 02/06/2019  .  Influenza Whole 03/08/2009  . Influenza, High Dose Seasonal PF 01/18/2017  . Influenza,inj,quad, With Preservative 01/25/2017  . Influenza-Unspecified 02/12/2016, 01/23/2017  . PFIZER SARS-COV-2 Vaccination 06/10/2019, 07/05/2019  . PPD Test 06/21/2019  . Pneumococcal Conjugate-13 02/17/2019  . Pneumococcal Polysaccharide-23 04/25/2004, 05/29/2017  . Td 04/27/2003  . Zoster 04/27/2017   Pertinent  Health Maintenance Due  Topic Date Due  . FOOT EXAM  Never done  . OPHTHALMOLOGY EXAM  Never done  . URINE MICROALBUMIN  03/26/2018  . INFLUENZA VACCINE  11/26/2019  . HEMOGLOBIN A1C  03/08/2020  . DEXA SCAN  Completed  . PNA vac Low Risk Adult  Completed   Fall Risk   10/20/2019 09/06/2019 07/03/2019 05/22/2019 04/14/2019  Falls in the past year? 1 1 0 1 0  Number falls in past yr: 1 1 0 0 0  Injury with Fall? 0 0 0 1 0    Vitals:   10/20/19 0926  BP: (!) 156/96  Pulse: 72  Resp: 16  Temp: (!) 97.1 F (36.2 C)  SpO2: 96%  Weight: 156 lb (70.8 kg)  Height: 5\' 5"  (1.651 m)   Body mass index is 25.96 kg/m. Physical Exam Vitals reviewed.  Constitutional:      General: She is not in acute distress.    Appearance: She is overweight. She is not ill-appearing.  HENT:     Head: Normocephalic.     Nose: Rhinorrhea present. No congestion.     Mouth/Throat:     Mouth: Mucous membranes are moist.     Pharynx: Oropharynx is clear. No oropharyngeal exudate or posterior oropharyngeal erythema.  Eyes:     General: No scleral icterus.       Right eye: No discharge.        Left eye: No discharge.     Extraocular Movements: Extraocular movements intact.     Conjunctiva/sclera: Conjunctivae normal.     Pupils: Pupils are equal, round, and reactive to light.  Neck:     Vascular: No carotid bruit.  Cardiovascular:     Rate and Rhythm: Normal rate and regular rhythm.     Pulses: Normal pulses.     Heart sounds: Murmur heard.  No friction rub. No gallop.   Pulmonary:     Effort: Pulmonary effort is normal. No respiratory distress.     Breath sounds: Normal breath sounds. No wheezing, rhonchi or rales.  Chest:     Chest wall: No tenderness.  Abdominal:     General: Bowel sounds are normal. There is no distension.     Palpations: Abdomen is soft. There is no mass.     Tenderness: There is no abdominal tenderness. There is no right CVA tenderness, left CVA tenderness, guarding or rebound.  Musculoskeletal:        General: No swelling or tenderness.     Cervical back: Normal range of motion. No rigidity or tenderness.     Right lower leg: Edema present.     Left lower leg: Edema present.     Comments: Bilateral lower extremities 2-3+ edema.     Lymphadenopathy:     Cervical: No cervical adenopathy.  Skin:    General: Skin is warm.     Coloration: Skin is not pale.     Findings: No bruising, erythema or rash.  Neurological:     Mental Status: She is alert. Mental status is at baseline.  Psychiatric:        Mood and Affect: Mood normal.  Speech: Speech normal.        Behavior: Behavior normal.        Thought Content: Thought content normal.        Judgment: Judgment normal.     Comments: Forgetful     Labs reviewed: Recent Labs    05/03/19 0845 05/03/19 0845 05/04/19 0500 05/04/19 0500 05/05/19 0500 05/24/19 1051 08/07/19 0436 08/07/19 1404 08/23/19 0000 09/06/19 1156 09/17/19 0824  NA 137   < > 139   < > 138   < > 140  --  142 139 140  K 4.2   < > 5.2*   < > 4.0   < > 2.9*   < > 3.8 4.2 3.7  CL 101   < > 101   < > 101   < > 106  --  105 104 105  CO2 25   < > 32   < > 22   < > 24  --  23* 24 24  GLUCOSE 136*   < > 140*   < > 127*   < > 122*  --   --  142* 128*  BUN 8   < > 7*   < > 11   < > 8  --  10 9 10   CREATININE 0.69   < > 0.87   < > 0.63   < > 0.71  --  0.5 0.66 0.53  CALCIUM 9.2   < > 9.7   < > 9.6   < > 9.3  --  9.3 10.0 9.5  MG 2.0   < > 2.0  --  1.8  --  1.7  --   --   --   --   PHOS 2.8  --  3.6  --  2.9  --   --   --   --   --   --    < > = values in this interval not displayed.   Recent Labs    07/31/19 0010 07/31/19 0010 08/06/19 1221 09/06/19 1156 09/17/19 0824  AST 25   < > 21 20 20   ALT 21   < > 15 12 12   ALKPHOS 59  --  55  --  69  BILITOT 0.7   < > 0.8 0.5 0.6  PROT 6.3*   < > 6.5 7.1 6.9  ALBUMIN 3.4*  --  3.5  --  4.1   < > = values in this interval not displayed.   Recent Labs    08/06/19 1221 08/06/19 1221 08/07/19 0436 08/07/19 0436 08/23/19 0000 09/06/19 1156 09/17/19 0824  WBC 6.4   < > 5.4  --  3.9 4.3 3.7*  NEUTROABS 4.3  --   --   --   --  2,511 1.1*  HGB 12.4   < > 11.1*   < > 11.5* 12.7 13.2  HCT 38.9   < > 33.5*   < > 35* 38.2 40.7  MCV 93.5   < >  88.9  --   --  89.0 91.3  PLT 233   < > 224   < > 179 227 181   < > = values in this interval not displayed.   Lab Results  Component Value Date   TSH 4.10 09/06/2019   Lab Results  Component Value Date   HGBA1C 6.5 (H) 09/06/2019   Lab Results  Component Value Date   CHOL 226 (H) 02/06/2019   HDL 81 02/06/2019  [place] 126 (H) 02/06/2019   TRIG 86 02/06/2019   CHOLHDL 2.8 02/06/2019    Significant Diagnostic Results in last 30 days:  DG Bone Density  Result Date: 09/28/2019 EXAM: DUAL X-RAY ABSORPTIOMETRY (DXA) FOR BONE MINERAL DENSITY IMPRESSION: Referring Physician:  Sandrea Hughs Your patient completed a BMD test using Lunar IDXA DXA system ( analysis version: 16 ) manufactured by [employer]. Technologist: WLS PATIENT: Name: Jalise, Zawistowski Patient ID: [id number] Birth Date: March 02, 1941 Height: 63.0 in. Sex: Female Measured: 09/28/2019 Weight: 155.6 lbs. Indications: Advanced Age, Bilateral Ovariectomy (65.51), Estrogen Deficient, Height Loss (781.91), Hypothyroid, Hysterectomy, Low Calcium Intake (269.3), Postmenopausal Fractures: None Treatments: None ASSESSMENT: The BMD measured at Femur Neck Left is 0.916 g/cm2 with a T-score of -0.9. This patient is considered normal according to [facility]) criteria. The scan quality is limited by patient body habitus. Lumbar spine was not utilized due to advanced degenerative changes and surgical hardware. Site Region Measured Date Measured Age YA BMD Significant CHANGE T-score DualFemur Neck Left 09/28/2019 77.8 -1.9 0.916 g/cm2 DualFemur Total Mean 09/28/2019 77.8 -0.1 0.993 g/cm2 Left Forearm Radius 33% 09/28/2019 77.8 -0.6 0.833 g/cm2 World Health Organization [facility]) criteria for post-menopausal, Caucasian Women: Normal       T-score at or above -1 SD Osteopenia   T-score between -1 and -2.5 SD Osteoporosis T-score at or below -2.5 SD RECOMMENDATION: 1. All patients should optimize calcium and vitamin D intake. 2. Consider  FDA approved medical therapies in postmenopausal women and men aged 71 years and older, based on the following: a. A hip or vertebral (clinical or morphometric) fracture b. T- score < or = -2.5 at the femoral neck or spine after appropriate evaluation to exclude secondary causes c. Low bone mass (T-score between -1.0 and -2.5 at the femoral neck or spine) and a 10 year probability of a hip fracture > or = 3% or a 10 year probability of a major osteoporosis-related fracture > or = 20% based on the [place]-adapted WHO algorithm d. Clinician judgment and/or patient preferences may indicate treatment for people with 10-year fracture probabilities above or below these levels FOLLOW-UP: Patients with diagnosis of osteoporosis or at high risk for fracture should have regular bone mineral density tests. For patients eligible for Medicare, routine testing is allowed once every 2 years. The testing frequency can be increased to one year for patients who have rapidly progressing disease, those who are receiving or discontinuing medical therapy to restore bone mass, or have additional risk factors. I have reviewed this report and agree with the above findings. [facility] Radiology Electronically Signed   By: Rolm Baptise M.D.   On: 09/28/2019 16:07   MM 3D SCREEN BREAST BILATERAL  Result Date: 09/29/2019 CLINICAL DATA:  Screening. EXAM: DIGITAL SCREENING BILATERAL MAMMOGRAM WITH TOMO AND CAD COMPARISON:  Previous exam(s). ACR Breast Density Category b: There are scattered areas of fibroglandular density. FINDINGS: There are no findings suspicious for malignancy. Images were processed with CAD. IMPRESSION: No mammographic evidence of malignancy. A result letter of this screening mammogram will be mailed directly to the patient. RECOMMENDATION: Screening mammogram in one year. (Code:SM-B-01Y) BI-RADS CATEGORY  1: Negative. Electronically Signed   By: Margarette Canada M.D.   On: 09/29/2019 14:27    Assessment/Plan 1. Bilateral leg  edema 2-3 + edema to bilateral lower extremities.No signs of fluid overload.will increase furosemide from 20 mg tablet to 40 mg tablet daily.Advised to take along with Potassium chloride 20 meq tablet daily.  - furosemide (  LASIX) 20 MG tablet; Take 2 tablets (40 mg total) by mouth daily.  Dispense: 30 tablet; Refill: 3 - Knee high Compression stockings advised to wear in the morning and remove at bedtime.  2. Essential hypertension B/p elevated this visit suspect due to her worsening edema.will adjust furosemide as below. - furosemide (LASIX) 20 MG tablet; Take 2 tablets (40 mg total) by mouth daily.  Dispense: 30 tablet; Refill: 3 - potassium chloride SA (KLOR-CON) 20 MEQ tablet; Take 1 tablet (20 mEq total) by mouth daily.  Dispense: 30 tablet; Refill: 3  3. Symptoms of urinary tract infection Afebrile.reports increased frequency and urgency. - POC Urinalysis Dipstick showed yellow clear urine positive for bilirubin,blood with small amounts of leukocytes 1+ but negative for nitrites.  - Culture, Urine  4. Non-seasonal allergic rhinitis, unspecified trigger Rhinorrhea  - loratadine (CLARITIN) 10 MG tablet; Take 1 tablet (10 mg total) by mouth daily.  Dispense: 30 tablet; Refill: 11  Family/ staff Communication: Reviewed plan of care with patient and care giver.   Labs/tests ordered: - Culture, Urine   Next Appointment: 2 weeks for evaluation of bilateral leg edema and check BMP.  Sandrea Hughs, NP

## 2019-10-22 LAB — URINE CULTURE
MICRO NUMBER:: 10639263
Result:: NO GROWTH
SPECIMEN QUALITY:: ADEQUATE

## 2019-10-24 ENCOUNTER — Other Ambulatory Visit: Payer: Self-pay | Admitting: Internal Medicine

## 2019-10-24 MED ORDER — METFORMIN HCL 500 MG PO TABS: 500.0000 mg | ORAL_TABLET | Freq: Every day | ORAL | 1 refills | Status: DC

## 2019-10-24 NOTE — Telephone Encounter (Signed)
rx sent to pharmacy by e-script  

## 2019-10-24 NOTE — Telephone Encounter (Signed)
Will send to covering provider to confirm requested metformin and metformin on medication list is the same. One says XR

## 2019-10-24 NOTE — Addendum Note (Signed)
Addended by: Logan Bores on: 10/24/2019 02:31 PM   Modules accepted: Orders

## 2019-10-24 NOTE — Telephone Encounter (Signed)
This should be fine, was she on XR previously?

## 2019-10-24 NOTE — Telephone Encounter (Signed)
Left message on voicemail for patient to return call when available   

## 2019-10-24 NOTE — Telephone Encounter (Signed)
Patients daughter returned my call and states we need to fill whatever is on her current medication list.   I will sent rx for metformin as listed on medication list and made a notation to [place] for metformin XR

## 2019-11-02 ENCOUNTER — Telehealth: Payer: Self-pay

## 2019-11-02 ENCOUNTER — Ambulatory Visit: Payer: Medicare Other | Admitting: Internal Medicine

## 2019-11-02 NOTE — Telephone Encounter (Signed)
Daughter of Joy Ward is a 79 y.o. female is calling for a sooner appt. Pt is having burning in her hands and is getting worse. She would like her mother to be seen before the next appt of 12/14/2019.

## 2019-11-09 ENCOUNTER — Ambulatory Visit: Payer: Medicare Other | Admitting: Internal Medicine

## 2019-11-09 ENCOUNTER — Encounter: Payer: Self-pay | Admitting: Internal Medicine

## 2019-11-09 ENCOUNTER — Ambulatory Visit (INDEPENDENT_AMBULATORY_CARE_PROVIDER_SITE_OTHER): Payer: Medicare Other | Admitting: Internal Medicine

## 2019-11-09 ENCOUNTER — Other Ambulatory Visit: Payer: Self-pay

## 2019-11-09 VITALS — BP 142/92 | HR 90 | Temp 97.5°F | Ht 65.0 in | Wt 153.0 lb

## 2019-11-09 DIAGNOSIS — R600 Localized edema: Secondary | ICD-10-CM

## 2019-11-09 NOTE — Progress Notes (Signed)
Location:  [facility] clinic Provider: Layne Dilauro L. Mariea Clonts, D.O., C.M.D.  Code Status: full code Goals of Care:  Advanced Directives 11/09/2019  Does Patient Have a Medical Advance Directive? Yes  Type of Advance Directive [place]  Does patient want to make changes to medical advance directive? No - Patient declined  Copy of [place] in Chart? No - copy requested  Would patient like information on creating a medical advance directive? -     Chief Complaint  Patient presents with  . Medical Management of Chronic Issues    2 week follow up on leg swelling     HPI: Patient is a 79 y.o. female with moderate dementia, htn, recurrent UTIs and urinary incontinence/OAB seen today for an acute visit for f/u on leg swelling.   At her last visit, the swelling was worsening and her lasix was increased from 82m daily to 447mdaily.  She was advised to wear compression stockings but I've since received numerous faxes from the facility about her refusing them.  She has no recall of the compression hose at all.    Past Medical History:  Diagnosis Date  . Allergy   . Arthritis   . Chronic back pain   . DDD (degenerative disc disease), lumbar   . Diabetes mellitus without complication ([place]  . Diabetes type 2, uncontrolled ([place]/24/2008   Annotation: Noninsulin dependent Qualifier: Diagnosis of  By: MuAmil AmenD, ElBenjamine Mola  . Former tobacco use   . Hyperlipidemia    a. H/o muscle aches with statins per PCP notes.  . Hypertension   . Hypothyroidism    a. prior thyroid removal.  . Memory loss or impairment 03/29/2019  . [facility]   a. Listed in PCP notes: "History of stroke:  History of terrible headache.  Had a CT scan of brain and found "ministroke"  Was removed from ERT subsequently. "    Past Surgical History:  Procedure Laterality Date  . ABDOMINAL HYSTERECTOMY  1994   TAH, not clear if unilateral oophorectomy as well.  [place] CHOLECYSTECTOMY  1973   open  . DECOMPRESSIVE LUMBAR LAMINECTOMY LEVEL 4  2005   [place] THYROID[place]  2012   multinodular goiter    Allergies  Allergen Reactions  . Clindamycin Hcl Other (See Comments)    REACTION: C. difficile colitis  . Sulfamethoxazole-Trimethoprim Other (See Comments)     Eye redness  . Zanaflex [Tizanidine Hcl] Anaphylaxis  . Metronidazole Hives  . Statins Other (See Comments)    Side Effect: muscle pain Has tried Zocor, Crestor--she does not want to take a statin, period  . Bee Venom Other (See Comments)    Unknown reaction  . Ace Inhibitors Cough  . Adhesive [Tape] Itching, Rash and Other (See Comments)    Blistering of skin     Outpatient Encounter Medications as of 11/09/2019  Medication Sig  . acetaminophen (TYLENOL) 325 MG tablet Take 650 mg by mouth every 6 (six) hours as needed.  . Marland Kitchencetaminophen (TYLENOL) 500 MG tablet Take 500 mg by mouth every 6 (six) hours as needed (fever 99.5-101, minor headache, minor discomfort).  . Marland KitchenmLODipine (NORVASC) 5 MG tablet TAKE 1 TABLET BY MOUTH 1 time DAILY  . bisacodyl (DULCOLAX) 10 MG suppository Place 1 suppository (10 mg total) rectally daily as needed for moderate constipation.  . diclofenac Sodium (VOLTAREN) 1 % GEL Apply 4 g  topically in the morning, at noon, and at bedtime.  . diclofenac Sodium (VOLTAREN) 1 % GEL Apply 4 g topically in the morning and at bedtime.  . donepezil (ARICEPT) 5 MG tablet TAKE 1 TABLET BY MOUTH AT BEDTIME  . furosemide (LASIX) 20 MG tablet Take 2 tablets (40 mg total) by mouth daily.  Marland Kitchen guaifenesin (ROBITUSSIN) 100 MG/5ML syrup Take 200 mg by mouth every 6 (six) hours as needed for cough.  Marland Kitchen HYDROcodone-acetaminophen (NORCO) 10-325 MG tablet Take 1 tablet by mouth every 6 (six) hours as needed for moderate pain.   Marland Kitchen levothyroxine (SYNTHROID) 100 MCG tablet Take 1 tablet (100 mcg total) by mouth daily.  Marland Kitchen loperamide (IMODIUM) 2 MG capsule Take 2 mg by  mouth as needed for diarrhea or loose stools (do not exceed 8 dsoses in 24 hours).  . loratadine (CLARITIN) 10 MG tablet Take 1 tablet (10 mg total) by mouth daily.  . magnesium hydroxide (MILK OF MAGNESIA) 400 MG/5ML suspension Take 30 mLs by mouth at bedtime as needed (constipation).  . metFORMIN (GLUCOPHAGE) 500 MG tablet Take 1 tablet (500 mg total) by mouth daily.  . metoprolol tartrate (LOPRESSOR) 25 MG tablet Take 12.5 mg by mouth 2 (two) times daily.  Marland Kitchen Neomycin-Bacitracin-Polymyxin (TRIPLE ANTIBIOTIC) [phone] OINT Apply topically as needed.  . Omega-3 Fatty Acids (FISH OIL) 1000 MG CAPS Take 1,000 mg by mouth daily.   . polyethylene glycol (MIRALAX / GLYCOLAX) 17 g packet Take 17 g by mouth daily.  . potassium chloride SA (KLOR-CON) 20 MEQ tablet Take 1 tablet (20 mEq total) by mouth daily.  . pravastatin (PRAVACHOL) 20 MG tablet Take 1 tablet (20 mg total) by mouth daily at 6 PM.  . QUEtiapine (SEROQUEL) 100 MG tablet Take 100 mg by mouth at bedtime.  . traZODone (DESYREL) 100 MG tablet Take 100 mg by mouth at bedtime.  . [DISCONTINUED] Calcium-Vitamin D 600-200 MG-UNIT tablet Take 1 tablet by mouth daily.   No facility-administered encounter medications on file as of 11/09/2019.    Review of Systems:  Review of Systems  Constitutional: Negative for chills and fever.  Eyes: Negative for blurred vision.  Respiratory: Negative for shortness of breath.   Cardiovascular: Negative for chest pain, palpitations and leg swelling.  Gastrointestinal: Negative for abdominal pain.  Musculoskeletal: Negative for falls.  Neurological: Negative for dizziness and loss of consciousness.  Psychiatric/Behavioral: Positive for memory loss.    Health Maintenance  Topic Date Due  . FOOT EXAM  Never done  . OPHTHALMOLOGY EXAM  Never done  . TETANUS/TDAP  04/26/2013  . URINE MICROALBUMIN  03/26/2018  . INFLUENZA VACCINE  11/26/2019  . HEMOGLOBIN A1C  03/08/2020  . DEXA SCAN  Completed  .  COVID-19 Vaccine  Completed  . Hepatitis C Screening  Completed  . PNA vac Low Risk Adult  Completed    Physical Exam: Vitals:   11/09/19 1431  BP: (!) 142/92  Pulse: 90  Temp: (!) 97.5 F (36.4 C)  SpO2: 93%  Weight: 153 lb (69.4 kg)  Height: _0  (1.651 m)   Body mass index is 25.46 kg/m. Physical Exam Vitals reviewed.  Constitutional:      Appearance: Normal appearance.  HENT:     Head: Normocephalic and atraumatic.  Eyes:     Extraocular Movements: Extraocular movements intact.     Pupils: Pupils are equal, round, and reactive to light.  Cardiovascular:     Rate and Rhythm: Normal rate and regular rhythm.  Pulmonary:  Effort: Pulmonary effort is normal.     Breath sounds: Normal breath sounds. No wheezing, rhonchi or rales.  Abdominal:     General: Bowel sounds are normal.  Musculoskeletal:        General: Normal range of motion.     Right lower leg: No edema.     Left lower leg: No edema.     Comments: Some puffiness of lateral malleoli (pt says "it's just fat there")  Skin:    General: Skin is warm and dry.  Neurological:     General: No focal deficit present.     Mental Status: She is alert. Mental status is at baseline.     Comments: Walks w/o assistive device  Psychiatric:        Mood and Affect: Mood normal.     Labs reviewed: Basic Metabolic Panel: Recent Labs    05/03/19 0845 05/03/19 0845 05/04/19 0500 05/04/19 0500 05/05/19 0500 05/24/19 1051 06/29/19 1346 07/23/19 0755 07/30/19 2215 07/31/19 0010 08/07/19 0436 08/07/19 1404 08/23/19 0000 09/06/19 1156 09/17/19 0824  NA 137   < > 139   < > 138   < > 137   < >  --    < > 140  --  142 139 140  K 4.2   < > 5.2*   < > 4.0   < > 4.0   < >  --    < > 2.9*   < > 3.8 4.2 3.7  CL 101   < > 101   < > 101   < > 99   < >  --    < > 106  --  105 104 105  CO2 25   < > 32   < > 22   < > 28   < >  --    < > 24  --  23* 24 24  GLUCOSE 136*   < > 140*   < > 127*   < > 156*   < >  --    < > 122*   --   --  142* 128*  BUN 8   < > 7*   < > 11   < > 12   < >  --    < > 8  --  _0 CREATININE 0.69   < > 0.87   < > 0.63   < > 0.94*   < >  --    < > 0.71  --  0.5 0.66 0.53  CALCIUM 9.2   < > 9.7   < > 9.6   < > 10.2   < >  --    < > 9.3  --  9.3 10.0 9.5  MG 2.0   < > 2.0  --  1.8  --   --   --   --   --  1.7  --   --   --   --   PHOS 2.8  --  3.6  --  2.9  --   --   --   --   --   --   --   --   --   --   TSH  --   --   --   --   --   --  7.79*  --  39.015*  --   --   --   --  4.10  --    < > =  values in this interval not displayed.   Liver Function Tests: Recent Labs    07/31/19 0010 07/31/19 0010 08/06/19 1221 09/06/19 1156 09/17/19 0824  AST 25   < > _0 ALT 21   < > _1 ALKPHOS 59  --  55  --  69  BILITOT 0.7   < > 0.8 0.5 0.6  PROT 6.3*   < > 6.5 7.1 6.9  ALBUMIN 3.4*  --  3.5  --  4.1   < > = values in this interval not displayed.   No results for input(s): LIPASE, AMYLASE in the last 8760 hours. No results for input(s): AMMONIA in the last 8760 hours. CBC: Recent Labs    08/06/19 1221 08/06/19 1221 08/07/19 0436 08/07/19 0436 08/23/19 0000 09/06/19 1156 09/17/19 0824  WBC 6.4   < > 5.4  --  3.9 4.3 3.7*  NEUTROABS 4.3  --   --   --   --  2,511 1.1*  HGB 12.4   < > 11.1*   < > 11.5* 12.7 13.2  HCT 38.9   < > 33.5*   < > 35* 38.2 40.7  MCV 93.5   < > 88.9  --   --  89.0 91.3  PLT 233   < > 224   < > 179 227 181   < > = values in this interval not displayed.   Lipid Panel: Recent Labs    02/06/19 1229  CHOL 226*  HDL 81  LDLCALC 126*  TRIG 86  CHOLHDL 2.8   Lab Results  Component Value Date   HGBA1C 6.5 (H) 09/06/2019    Procedures since last visit: No results found.  Assessment/Plan 1. Bilateral leg edema -lungs clear, edema resolved with lasix 77m daily and on kcl 214m daily - BMP with eGFR([facility])  Note that her appt got rescheduled from this am for some reason so she's not fasting midafternoon so only bmp  done  Labs/tests ordered:   Lab Orders     BMP with eGFR([facility])  Next appt:  11/27/2019  Errick Salts L. Jayvyn Haselton, D.O. [facility] [address] [address] Phone (Mon-Fri 8am-5pm):  [phone] Call:  [phone] follow prompts after 5pm & weekends Office Phone:  [phone] Fax:  [phone]

## 2019-11-10 LAB — BASIC METABOLIC PANEL WITH GFR
BUN/Creatinine Ratio: 15 (calc) (ref 6–22)
BUN: 14 mg/dL (ref 7–25)
CO2: 27 mmol/L (ref 20–32)
Calcium: 10.1 mg/dL (ref 8.6–10.4)
Chloride: 103 mmol/L (ref 98–110)
Creat: 0.94 mg/dL — ABNORMAL HIGH (ref 0.60–0.93)
GFR, Est African American: 67 mL/min/{1.73_m2} (ref >=60)
GFR, Est Non African American: 58 mL/min/{1.73_m2} — ABNORMAL LOW (ref >=60)
Glucose, Bld: 124 mg/dL (ref 65–139)
Potassium: 3.7 mmol/L (ref 3.5–5.3)
Sodium: 140 mmol/L (ref 135–146)

## 2019-11-10 NOTE — Progress Notes (Signed)
As expected, addition of the diuretic worsens her normal kidney function.  It is even more crucial that she drink 6 8oz glasses of water per day.  This along with her urinary complaints she recurrently has are the reasons I myself had avoided starting her on a regular diuretic and recommended compression hose.  She has not been willing to wear these per [place].   Please notify her RP/POA of these results and send to [facility].

## 2019-11-16 ENCOUNTER — Other Ambulatory Visit: Payer: Self-pay | Admitting: Internal Medicine

## 2019-11-16 NOTE — Telephone Encounter (Signed)
Rx has already been filled. 

## 2019-11-16 NOTE — Telephone Encounter (Deleted)
Received refill request from pharmacy Pended Rx and sent to Dr. Reed for approval.  

## 2019-11-22 NOTE — Telephone Encounter (Signed)
Patient's daughter called back to accept the sooner appt. I have moved patient's appt.

## 2019-11-22 NOTE — Telephone Encounter (Signed)
Called the daughter to discuss an opening that was available for tomorrow at 1 pm. There was no answer. LVM advising of this information and that I would leave on hold for about 2 hrs and then it will have to be offered to someone else. Asked for a call back.

## 2019-11-23 ENCOUNTER — Other Ambulatory Visit: Payer: Self-pay

## 2019-11-23 ENCOUNTER — Ambulatory Visit (INDEPENDENT_AMBULATORY_CARE_PROVIDER_SITE_OTHER): Payer: Medicare Other | Admitting: Neurology

## 2019-11-23 ENCOUNTER — Encounter: Payer: Self-pay | Admitting: Neurology

## 2019-11-23 VITALS — BP 139/85 | HR 77 | Ht 65.0 in | Wt 152.0 lb

## 2019-11-23 DIAGNOSIS — G5602 Carpal tunnel syndrome, left upper limb: Secondary | ICD-10-CM | POA: Diagnosis not present

## 2019-11-23 DIAGNOSIS — M1990 Unspecified osteoarthritis, unspecified site: Secondary | ICD-10-CM | POA: Diagnosis not present

## 2019-11-23 NOTE — Progress Notes (Signed)
SLEEP MED[facility]INE CLINIC   Provider:  Larey Seat, M D  Primary Care Physician:  Gayland Curry, DO   Referring Provider:   Chief Complaint  Patient presents with  . New Patient (Initial Visit)    pt alone, rm 10. presents today for ongoing left arm/hand numbness and tingling. this started almost year ago when she woke up with pain and since then it has progressed.- sensation in the right hand is newer - but mostly in the left.     HPI:  Joy Ward is a 79 y.o. female , was seen here in 06-2017 upon  a referral  from  Dr. Lucianne Lei, and Dr Nelva Bush for excessive daytime sleepiness. She now returns on [phone] for left arm numbness and tingling in all 4 fingers, with the exception of the pinky- the middle finger is painful and so is the index finger- this wakes her now from sleep - has happened over the last 3-4 month. Dr. Hollace Kinnier referred her-  She woke up in early AM, and is now hurting in daytime. Pain comes with finger flexion.  She reports that some numbing cream was applied and has helped some- but it doesn't last. Now the right hand fingertips are getting numbish.    07-24-2019 The patient is a 79 year old African-American right-handed lady, who has been followed by Dr. Nelva Bush . She had mentioned to him that she sleeps 12 or more hours each day and this hypersomnia is the reason for today's referral.  Reports that she has been excessively daytime sleepy since 2016.  She has a history of thyroidectomy, trochanteric bursitis of both hips, degenerative disc disease, neuroforaminal stenosis of the lumbar spine, osteoarthritis, plantar fasciitis, she is a former smoker, hypertension, allergic rhinitis, diabetes and glaucoma.  Chief complaint according to patient :" I am so very sleepy, and forgetful." She has used the following medications for pain control :hydrocodone and Ultram(pain level can be around 4-5 out of 10 points and she has received cortisone injections, the last one  in September 2018 for back pain, no history of doctor shopping, she has not shown addiction problems)- but she also mentioned memory issues.  Patient has lived in different states over the last 20 years and some of her medical history dates back to when she lived in [place], other parts to [place]. In  2012  She underwent thyroidectomy, and became sensitive to cold. Insomnia has been a problem for decades. About 20 years ago the patient had menopausal sleep problems, could not go to sleep and was given Ambien then as a brand name.  She has taken Ambien  10 mg for all these years but stopped it when she became excessively daytime sleepy. Now she seems to be able to go to sleep without any sleep aid -her problem is EDS.  She is now taking an Ambien still 1 or 2 days out of 14 days. Much less than before.  Sleep habits are as follows: The patient reports a bedtime between midnight and 0:30 AM.  She has tried to go earlier to bed only to find herself tossing and turning and not be able to go to sleep.  She has seen a sleep doctor in [place] before who told her that she should leave the bed instead of staying.  She often leaves the bedroom and watches TV, then may return to the bedroom at about 1 AM again, she has about 3 bathroom breaks each night. Prefers to sleep on  her right side but struggles with back pain and bursitis hip pain. She is struggling with daytime sleepiness right after she wakes up.  She may wake up as early as 8 AM but she may not rise until 2:00 some days. She has tried to get up earlier but then she falls asleep easily again.  Right now she is so sleepy she does not trust herself driving.   Sleep medical history and family sleep history: All brothers have predeceased her and her sister only living sibling has Alzheimer. I reviewed the medication list.   Social history: raised 7 children, husband left when the youngest was 78 years of age, oldest is now 47 born in [birth year],  youngest is now 15 , born in [birth year]. The patient moved from [place] to [place], and back to her Birth [address] in [place]. The patient was a former smoker but quit over 50 years ago, she at the same time stop drinking alcohol, caffeine use form of ice tea, but not coffee or soda.For on her years of employment she was in earlier to bed and early to rise.  Menopause seems to have changed her sleep pattern and she has more morbid, delayed sleep phase person.  She also has several medications that could account for excessive daytime sleepiness including Ambien currently only prescribed at 5 mg Zanaflex 4 mg tablets which she said she does not take anymore and Norco 10/325 mg tablets   Review of Systems: Out of a complete 14 system review, the patient complains of only the following symptoms, and all other reviewed systems are negative. She cries a lot, is forgetful, excessively daytime sleepy, did not endorse fatigue or depression but does have delayed sleep phase. Her sleep test showed no apnea-  Epworth score 14/ 24   ,   Social History   Socioeconomic History  . Marital status: Single    Spouse name: Not on file  . Number of children: 7  . Years of education: 67  . Highest education level: Not on file  Occupational History  . Occupation: pastor--travels a lot.  Tobacco Use  . Smoking status: Former Research scientist (life sciences)  . Smokeless tobacco: Never Used  . Tobacco comment: No smoking since 79 yo - smoked 10 yrs  Vaping Use  . Vaping Use: Never used  Substance and Sexual Activity  . Alcohol use: No  . Drug use: No  . Sexual activity: Not on file  Other Topics Concern  . Not on file  Social History Narrative   Lives alone in [place].   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   Social Determinants of Health   Financial Resource Strain:   . Difficulty of Paying Living Expenses:   Food Insecurity:   . Worried About Charity fundraiser in  the Last Year:   . Arboriculturist in the Last Year:   Transportation Needs:   . Film/video editor (Medical):   Marland Kitchen Lack of Transportation (Non-Medical):   Physical Activity:   . Days of Exercise per Week:   . Minutes of Exercise per Session:   Stress:   . Feeling of Stress :   Social Connections:   . Frequency of Communication with Friends and Family:   . Frequency of Social Gatherings with Friends and Family:   . Attends Religious Services:   . Active Member of Clubs or Organizations:   . Attends Archivist  Meetings:   Marland Kitchen Marital Status:   Intimate Partner Violence:   . Fear of Current or Ex-Partner:   . Emotionally Abused:   Marland Kitchen Physically Abused:   . Sexually Abused:     Family History  Problem Relation Age of Onset  . Heart disease Mother        unclear details "atherosclerosis"  . Diabetes Mother   . Alcohol abuse Sister   . Diabetes Brother   . Diabetes Brother   . Drug abuse Brother   . Diabetes Daughter   . Heart disease Son        CHF  . Alcohol abuse Son     Past Medical History:  Diagnosis Date  . Allergy   . Arthritis   . Chronic back pain   . DDD (degenerative disc disease), lumbar   . Diabetes mellitus without complication ([place])   . Diabetes type 2, uncontrolled ([place]) 09/18/2006   Annotation: Noninsulin dependent Qualifier: Diagnosis of  By: Amil Amen MD, Benjamine Mola    . Former tobacco use   . Hyperlipidemia    a. H/o muscle aches with statins per PCP notes.  . Hypertension   . Hypothyroidism    a. prior thyroid removal.  . Memory loss or impairment 03/29/2019  . Stroke [facility])    a. Listed in PCP notes: "History of stroke:  History of terrible headache.  Had a CT scan of brain and found "ministroke"  Was removed from ERT subsequently. "    Past Surgical History:  Procedure Laterality Date  . ABDOMINAL HYSTERECTOMY  1994   TAH, not clear if unilateral oophorectomy as well.  Marland Kitchen [place]  . CHOLECYSTECTOMY  1973    open  . DECOMPRESSIVE LUMBAR LAMINECTOMY LEVEL 4  2005   In [place]  . THYROIDECTOMY  2012   multinodular goiter    Current Outpatient Medications  Medication Sig Dispense Refill  . acetaminophen (TYLENOL) 500 MG tablet Take 500 mg by mouth every 6 (six) hours as needed (fever 99.5-101, minor headache, minor discomfort).    Marland Kitchen amLODipine (NORVASC) 5 MG tablet TAKE 1 TABLET BY MOUTH 1 time DAILY 30 tablet 5  . bisacodyl (DULCOLAX) 10 MG suppository Place 1 suppository (10 mg total) rectally daily as needed for moderate constipation. 12 suppository 0  . diclofenac Sodium (VOLTAREN) 1 % GEL Apply 4 g topically in the morning, at noon, and at bedtime.    . donepezil (ARICEPT) 5 MG tablet TAKE 1 TABLET BY MOUTH AT BEDTIME 30 tablet 5  . furosemide (LASIX) 20 MG tablet Take 2 tablets (40 mg total) by mouth daily. 30 tablet 3  . HYDROcodone-acetaminophen (NORCO) 10-325 MG tablet Take 1 tablet by mouth every 6 (six) hours as needed for moderate pain.     Marland Kitchen levothyroxine (SYNTHROID) 100 MCG tablet Take 1 tablet (100 mcg total) by mouth daily. 90 tablet 0  . loperamide (IMODIUM) 2 MG capsule Take 2 mg by mouth as needed for diarrhea or loose stools (do not exceed 8 dsoses in 24 hours).    . loratadine (CLARITIN) 10 MG tablet Take 1 tablet (10 mg total) by mouth daily. 30 tablet 11  . magnesium hydroxide (MILK OF MAGNESIA) 400 MG/5ML suspension Take 30 mLs by mouth at bedtime as needed (constipation). 355 mL 0  . metFORMIN (GLUCOPHAGE) 500 MG tablet Take 1 tablet (500 mg total) by mouth daily. 90 tablet 1  . metoprolol tartrate (LOPRESSOR) 25 MG tablet Take 12.5 mg by mouth  2 (two) times daily.    Marland Kitchen Neomycin-Bacitracin-Polymyxin (TRIPLE ANTIBIOTIC) [phone] OINT Apply topically as needed.    . Omega-3 Fatty Acids (FISH OIL) 1000 MG CAPS Take 1,000 mg by mouth daily.     . polyethylene glycol (MIRALAX / GLYCOLAX) 17 g packet Take 17 g by mouth daily. 14 each 0  . potassium chloride SA  (KLOR-CON) 20 MEQ tablet Take 1 tablet (20 mEq total) by mouth daily. 30 tablet 3  . pravastatin (PRAVACHOL) 20 MG tablet Take 1 tablet (20 mg total) by mouth daily at 6 PM. 30 tablet 3  . QUEtiapine (SEROQUEL) 100 MG tablet Take 100 mg by mouth at bedtime.     No current facility-administered medications for this visit.    Allergies as of 11/23/2019 - Review Complete 11/23/2019  Allergen Reaction Noted  . Clindamycin hcl Other (See Comments) 10/15/2006  . Sulfamethoxazole-trimethoprim Other (See Comments) 02/06/2019  . Zanaflex [tizanidine hcl] Anaphylaxis 10/15/2016  . Metronidazole Hives 09/18/2006  . Statins Other (See Comments) 05/02/2016  . Bee venom Other (See Comments) 06/24/2019  . Ace inhibitors Cough 09/18/2006  . Adhesive [tape] Itching, Rash, and Other (See Comments) 04/22/2016    Vitals: BP (!) 139/85   Pulse 77   Ht 5' 5"  (1.651 m)   Wt 152 lb (68.9 kg)   BMI 25.29 kg/m  Last Weight:  Wt Readings from Last 1 Encounters:  11/23/19 152 lb (68.9 kg)   BOF:BPZW mass index is 25.29 kg/m.     Last Height:   Ht Readings from Last 1 Encounters:  11/23/19 5' 5"  (1.651 m)    Physical exam:  General: The patient is awake, alert and appears not in acute distress. The patient is well groomed. Head: Normocephalic, atraumatic. Neck is supple. Mallampati  3,  neck circumference:14. Nasal airflow congested -  Retrognathia is not seen.  Cardiovascular:  Regular rate and rhythm , without  murmurs or carotid bruit, and without distended neck veins. Respiratory: Lungs are clear to auscultation. Skin:  Without evidence of edema, or rash Trunk: BMI was 33 last visit 27 month ago - now 25.29 kg/m2- intended weight loss.  . The patient's posture is erect/   Neurologic exam : The patient is awake and alert, oriented to place and time.   Memory  MMSE: MMSE - Mini Mental State Exam 07/15/2016  Orientation to time 5  Orientation to Place 5  Registration 3  Attention/  Calculation 5  Recall 3  Recall-comments Initially stated she could not remember and remembered everything else she had been asked, but then came up with the three things to recall.  Language- name 2 objects 2  Language- repeat 1  Language- follow 3 step command 3  Language- read & follow direction 1  Write a sentence 1  Copy design 1  Total score 30    Attention span & concentration ability appears normal.  Speech is fluent,  without  dysarthria, dysphonia or aphasia.  Mood and affect are appropriate.  Cranial nerves: Pupils are equal and briskly reactive to light. Funduscopic exam without evidence of pallor or edema, no cataracts. Extraocular movements  in vertical and horizontal planes intact and without nystagmus. Visual fields by finger perimetry are intact. Hearing to finger rub intact.  Facial sensation intact to fine touch. Facial motor strength is symmetric and tongue and uvula move midline.  Shoulder shrug was symmetrical.  Motor exam:  Normal tone, muscle bulk and symmetric strength in all extremities.  She has lesser grip  strength in the left hand - she is right hand dominant.  There is atrophy of the thenar eminance, there is finger flexion weakness and normal extension.   Sensory:  Fine touch, pinprick and vibration were tested in all extremities. Proprioception tested in the upper extremities was normal. Tingling numbness evident by Q- tip testing, also tingling at the left wrist. The pinky finger is not involved. The right hand has no dysesthesias.   Coordination: Rapid alternating movements in the fingers/hands was normal. Finger-to-nose maneuver  normal without evidence of ataxia, dysmetria or tremor. Gait and station: Patient walks without assistive device and is able unassisted to climb up to the exam table. Strength within normal limits.  Deep tendon reflexes: in the upper and lower extremities are symmetric and intact. Babinski maneuver deferred.    Assessment:   After physical and neurologic examination, review of laboratory studies,  Personal review of imaging studies, reports of other /same  Imaging studies, results of polysomnography and / or neurophysiology testing and pre-existing records as far as provided in visit., my assessment is   1) The hand/ finger  dyseasthesias may be 12 month ongoing or 3 month- the patient is unsure, but it was not a problem when we met for sleep evaluation. The palm is involved and not the dorsum manus. He hand feels colder, positive tinnel sign.   The patient was advised of the nature of the diagnosed disorder , the treatment options and the  risks for general health and wellness arising from not treating the condition.   I spent more than 30 minutes of face to face time with the patient. We have discussed the diagnosis and differential and I answered the patient's questions.    Plan:  1) NCV and EMG ordered. I suspect a carpal tunnel syndrome , may be related to arthritic changes.   Larey Seat, MD 5/45/6256, 3:89 PM  Certified in Neurology by ABPN Certified in [place] by [facility] [address] [place], [place] [address]

## 2019-11-23 NOTE — Patient Instructions (Signed)
Arthritis  arthritis (A) is a long-term (chronic) disease that causes inflammation in your joints. It may start slowly. It most often affects the small joints of the hands and feet.  Usually, the same joints are affected on both sides of your body.  Inflammation from RA can also affect other parts of your body, including your heart, eyes, or lungs. There is no cure for RA, but medicines can help your symptoms and halt or slow down the progression of the disease. What are the causes? RA is an autoimmune disease. When you have an autoimmune disease, your body's defense system (immune system) mistakenly attacks healthy body tissues. The exact cause of RA is not known. What increases the risk? You are more likely to develop this condition if you:  Are a woman.  Have a family history of RA or other autoimmune diseases.  Have a history of smoking.  Are obese.  Have been exposed to pollutants or chemicals. What are the signs or symptoms? The first symptom of this condition may be morning stiffness that lasts longer than 30 minutes.  Symptoms usually start gradually. They are often worse in the morning. As RA progresses, symptoms may include:  Pain, stiffness, swelling, warmth, and tenderness in joints on both sides of your body.  Loss of energy.  Loss of appetite.  Weight loss.  Low-grade fever.  Dry eyes and dry mouth.  Firm lumps (rheumatoid nodules) that grow beneath your skin in areas such as your forearm bones near your elbows and on your hands.  Changes in the appearance of joints (deformity) and loss of joint function. Symptoms of this condition vary from person to person.  Symptoms of RA often come and go.  Sometimes, symptoms get worse for a period of time. These are called flares. How is this diagnosed? This condition is diagnosed based on your symptoms, medical history, and physical exam.  You may have X-rays or an MRI to check for the type of joint changes that  are caused by RA. You may also have blood tests to look for:  Proteins (antibodies) that your immune system may make if you have RA. These include rheumatoid factor (RF) and anti-CCP. ? When blood tests show these proteins, you are said to have "seropositive RA." ? When blood tests do not show these proteins, you may have "seronegative RA."  Inflammation in your blood.  A low number of red blood cells (anemia). How is this treated? The goals of treatment are to relieve pain, reduce inflammation, and slow down or stop joint damage and disability. Treatment may include:  Lifestyle changes. It is important to rest as needed, eat a healthy diet, and exercise.  Medicines. Your health care provider may adjust your medicines every 3 months until treatment goals are reached. Common medicines include: ? Pain relievers (analgesics). ? Corticosteroids and NSAIDs to reduce inflammation. ? Disease-modifying antirheumatic drugs (DMARDs) to try to slow the course of the disease. ? Biologic response modifiers to reduce inflammation and damage.  Physical therapy and occupational therapy.  Surgery, if you have severe joint damage. Joint replacement or fusing of joints may be needed. Your health care provider will work with you to identify the best treatment option for you based on assessment of the overall disease activity in your body. Follow these instructions at home: Activity  Return to your normal activities as told by your health care provider. Ask your health care provider what activities are safe for you.  Rest when you are having  a flare.  Start an exercise program as told by your health care provider. General instructions  Keep all follow-up visits as told by your health care provider. This is important.  Take over-the-counter and prescription medicines only as told by your health care provider. Where to find more information  [employer] of Rheumatology:  www.rheumatology.[place]: www.arthritis.org Contact a health care provider if:  You have a flare-up of RA symptoms.  You have a fever.  You have side effects from your medicines. Get help right away if:  You have chest pain.  You have trouble breathing.  You quickly develop a hot, painful joint that is more severe than your usual joint aches. Summary  Rheumatoid arthritis (RA) is a long-term (chronic) disease that causes inflammation in your joints.  RA is an autoimmune disease.  The goals of treatment are to relieve pain, reduce inflammation, and slow down or stop joint damage and disability. This information is not intended to replace advice given to you by your health care provider. Make sure you discuss any questions you have with your health care provider. Document Revised: 10/25/2018 Document Reviewed: 12/14/2017 Elsevier Patient Education  2020 [employer].

## 2019-11-23 NOTE — Addendum Note (Signed)
Addended by: Larey Seat on: 11/23/2019 01:37 PM   Modules accepted: Orders

## 2019-11-24 LAB — ANA W/REFLEX IF POSITIVE: Anti Nuclear Antibody (ANA): NEGATIVE

## 2019-11-24 LAB — RHEUMATOID FACTOR: Rheumatoid fact SerPl-aCnc: <10 IU/mL (ref 0.0–13.9)

## 2019-11-26 NOTE — Progress Notes (Signed)
Negative rheumatoid factor and ANA.

## 2019-11-27 ENCOUNTER — Ambulatory Visit: Payer: Self-pay | Admitting: Internal Medicine

## 2019-11-27 ENCOUNTER — Telehealth: Payer: Self-pay | Admitting: Neurology

## 2019-11-27 NOTE — Telephone Encounter (Signed)
-----   Message from Larey Seat, MD sent at 11/26/2019  2:59 PM EDT ----- Negative rheumatoid factor and ANA.

## 2019-11-27 NOTE — Telephone Encounter (Signed)
Called the pt to review lab results. There was no answer. LVM advising the patient to call back or check her mychart as I will send a message with the results.   **If pt returns call please advise that the labs to assess if there is anything auto immune related came back negative.

## 2019-11-29 ENCOUNTER — Encounter: Payer: Self-pay | Admitting: Neurology

## 2019-12-04 ENCOUNTER — Ambulatory Visit (INDEPENDENT_AMBULATORY_CARE_PROVIDER_SITE_OTHER): Payer: Medicare Other | Admitting: Internal Medicine

## 2019-12-04 ENCOUNTER — Encounter: Payer: Self-pay | Admitting: Internal Medicine

## 2019-12-04 ENCOUNTER — Other Ambulatory Visit: Payer: Self-pay

## 2019-12-04 VITALS — BP 136/84 | HR 72 | Temp 97.3°F | Resp 16 | Ht 65.0 in | Wt 151.6 lb

## 2019-12-04 DIAGNOSIS — F01518 Vascular dementia, unspecified severity, with other behavioral disturbance: Secondary | ICD-10-CM

## 2019-12-04 DIAGNOSIS — G8929 Other chronic pain: Secondary | ICD-10-CM | POA: Diagnosis not present

## 2019-12-04 DIAGNOSIS — N3281 Overactive bladder: Secondary | ICD-10-CM

## 2019-12-04 DIAGNOSIS — M5416 Radiculopathy, lumbar region: Secondary | ICD-10-CM | POA: Diagnosis not present

## 2019-12-04 DIAGNOSIS — F0151 Vascular dementia with behavioral disturbance: Secondary | ICD-10-CM

## 2019-12-04 NOTE — Progress Notes (Signed)
Location:  [facility] clinic Provider: Vail Vuncannon L. Mariea Ward, D.O., C.M.D.  Goals of Care:  Advanced Directives 12/04/2019  Does Patient Have a Medical Advance Directive? Yes  Type of Advance Directive [place]  Does patient want to make changes to medical advance directive? No - Patient declined  Copy of [name] in Chart? No - copy requested  Would patient like information on creating a medical advance directive? -     Chief Complaint  Patient presents with  . Form Completion    Fill out papers for [place] long term care.    HPI: Patient is a 79 y.o. female seen today for form completion to go to [place].  She's accompanied by her daughter, Joy Ward.  She is to be moving to live with her other daughter.  Extensive form completed and then I could not answer last question about PASSR.    Past Medical History:  Diagnosis Date  . Allergy   . Arthritis   . Chronic back pain   . DDD (degenerative disc disease), lumbar   . Diabetes mellitus without complication ([place])   . Diabetes type 2, uncontrolled ([place]) 09/18/2006   Annotation: Noninsulin dependent Qualifier: Diagnosis of  By: Amil Amen MD, Benjamine Mola    . Former tobacco use   . Hyperlipidemia    a. H/o muscle aches with statins per PCP notes.  . Hypertension   . Hypothyroidism    a. prior thyroid removal.  . Memory loss or impairment 03/29/2019  . Stroke [facility])    a. Listed in PCP notes: "History of stroke:  History of terrible headache.  Had a CT scan of brain and found "ministroke"  Was removed from ERT subsequently. "    Past Surgical History:  Procedure Laterality Date  . ABDOMINAL HYSTERECTOMY  1994   TAH, not clear if unilateral oophorectomy as well.  Marland Kitchen [place]  . CHOLECYSTECTOMY  1973   open  . DECOMPRESSIVE LUMBAR LAMINECTOMY LEVEL 4  2005   In [place]  . THYROIDECTOMY  2012   multinodular goiter    Allergies  Allergen Reactions  . Clindamycin Hcl Other (See  Comments)    REACTION: C. difficile colitis  . Sulfamethoxazole-Trimethoprim Other (See Comments)     Eye redness  . Zanaflex [Tizanidine Hcl] Anaphylaxis  . Metronidazole Hives  . Statins Other (See Comments)    Side Effect: muscle pain Has tried Zocor, Crestor--she does not want to take a statin, period  . Bee Venom Other (See Comments)    Unknown reaction  . Ace Inhibitors Cough  . Adhesive [Tape] Itching, Rash and Other (See Comments)    Blistering of skin     Outpatient Encounter Medications as of 12/04/2019  Medication Sig  . acetaminophen (TYLENOL) 500 MG tablet Take 500 mg by mouth every 6 (six) hours as needed (fever 99.5-101, minor headache, minor discomfort).  Marland Kitchen amLODipine (NORVASC) 5 MG tablet TAKE 1 TABLET BY MOUTH 1 time DAILY  . bisacodyl (DULCOLAX) 10 MG suppository Place 1 suppository (10 mg total) rectally daily as needed for moderate constipation.  . diclofenac Sodium (VOLTAREN) 1 % GEL Apply 4 g topically in the morning, at noon, and at bedtime.  . donepezil (ARICEPT) 5 MG tablet TAKE 1 TABLET BY MOUTH AT BEDTIME  . furosemide (LASIX) 20 MG tablet Take 2 tablets (40 mg total) by mouth daily.  Marland Kitchen HYDROcodone-acetaminophen (NORCO) 10-325 MG tablet Take 1 tablet by mouth every 6 (six) hours as  needed for moderate pain.   Marland Kitchen levothyroxine (SYNTHROID) 100 MCG tablet Take 1 tablet (100 mcg total) by mouth daily.  Marland Kitchen loperamide (IMODIUM) 2 MG capsule Take 2 mg by mouth as needed for diarrhea or loose stools (do not exceed 8 dsoses in 24 hours).  . loratadine (CLARITIN) 10 MG tablet Take 1 tablet (10 mg total) by mouth daily.  . magnesium hydroxide (MILK OF MAGNESIA) 400 MG/5ML suspension Take 30 mLs by mouth at bedtime as needed (constipation).  . metFORMIN (GLUCOPHAGE) 500 MG tablet Take 1 tablet (500 mg total) by mouth daily.  . metoprolol tartrate (LOPRESSOR) 25 MG tablet Take 12.5 mg by mouth 2 (two) times daily.  Marland Kitchen Neomycin-Bacitracin-Polymyxin (TRIPLE ANTIBIOTIC)  [phone] OINT Apply topically as needed.  . Omega-3 Fatty Acids (FISH OIL) 1000 MG CAPS Take 1,000 mg by mouth daily.   . polyethylene glycol (MIRALAX / GLYCOLAX) 17 g packet Take 17 g by mouth daily.  . potassium chloride SA (KLOR-CON) 20 MEQ tablet Take 1 tablet (20 mEq total) by mouth daily.  . pravastatin (PRAVACHOL) 20 MG tablet Take 1 tablet (20 mg total) by mouth daily at 6 PM.  . QUEtiapine (SEROQUEL) 100 MG tablet Take 100 mg by mouth at bedtime.   No facility-administered encounter medications on file as of 12/04/2019.    Review of Systems:  Review of Systems  Constitutional: Negative for chills and fever.  HENT: Negative for congestion.   Eyes: Negative for blurred vision.  Respiratory: Negative for cough and shortness of breath.   Cardiovascular: Negative for chest pain, palpitations and leg swelling.       Legs swell at end of day and depending on what she eats  Gastrointestinal: Positive for constipation. Negative for abdominal pain, blood in stool, diarrhea and melena.  Genitourinary: Positive for frequency and urgency. Negative for dysuria, flank pain and hematuria.       Overactive bladder- educated pt and daughter again about this b/c they wanted another urine sample for UTI done (and pt not having new symptoms)  Musculoskeletal: Positive for back pain. Negative for falls and joint pain.  Neurological: Negative for dizziness and loss of consciousness.  Psychiatric/Behavioral: Positive for depression, hallucinations and memory loss. The patient has insomnia. The patient is not nervous/anxious.     Health Maintenance  Topic Date Due  . FOOT EXAM  Never done  . OPHTHALMOLOGY EXAM  Never done  . TETANUS/TDAP  04/26/2013  . URINE MICROALBUMIN  03/26/2018  . INFLUENZA VACCINE  11/26/2019  . HEMOGLOBIN A1C  03/08/2020  . DEXA SCAN  Completed  . COVID-19 Vaccine  Completed  . Hepatitis C Screening  Completed  . PNA vac Low Risk Adult  Completed    Physical Exam:  Vitals:   12/04/19 1546  BP: 136/84  Pulse: 72  Resp: 16  Temp: (!) 97.3 F (36.3 C)  SpO2: 98%  Weight: 151 lb 9.6 oz (68.8 kg)  Height: 5\' 5"  (1.651 m)   Body mass index is 25.23 kg/m. Physical Exam Vitals reviewed.  Constitutional:      Appearance: Normal appearance.  Cardiovascular:     Rate and Rhythm: Normal rate and regular rhythm.     Heart sounds: Murmur heard.   Pulmonary:     Effort: Pulmonary effort is normal.     Breath sounds: Normal breath sounds. No wheezing, rhonchi or rales.  Abdominal:     General: Bowel sounds are normal.     Palpations: Abdomen is soft.  Musculoskeletal:  General: Normal range of motion.     Right lower leg: No edema.     Left lower leg: No edema.  Skin:    General: Skin is warm and dry.  Neurological:     General: No focal deficit present.     Mental Status: She is alert.  Psychiatric:        Mood and Affect: Mood normal.        Behavior: Behavior normal.     Labs reviewed: Basic Metabolic Panel: Recent Labs    05/03/19 0845 05/03/19 0845 05/04/19 0500 05/04/19 0500 05/05/19 0500 05/24/19 1051 06/29/19 1346 07/23/19 0755 07/30/19 2215 07/31/19 0010 08/07/19 0436 08/07/19 1404 09/06/19 1156 09/17/19 0824 11/09/19 1458  NA 137   < > 139   < > 138   < > 137   < >  --    < > 140   < > 139 140 140  K 4.2   < > 5.2*   < > 4.0   < > 4.0   < >  --    < > 2.9*   < > 4.2 3.7 3.7  CL 101   < > 101   < > 101   < > 99   < >  --    < > 106   < > 104 105 103  CO2 25   < > 32   < > 22   < > 28   < >  --    < > 24   < > 24 24 27   GLUCOSE 136*   < > 140*   < > 127*   < > 156*   < >  --    < > 122*  --  142* 128* 124  BUN 8   < > 7*   < > 11   < > 12   < >  --    < > 8   < > 9 10 14   CREATININE 0.69   < > 0.87   < > 0.63   < > 0.94*   < >  --    < > 0.71   < > 0.66 0.53 0.94*  CALCIUM 9.2   < > 9.7   < > 9.6   < > 10.2   < >  --    < > 9.3   < > 10.0 9.5 10.1  MG 2.0   < > 2.0  --  1.8  --   --   --   --   --  1.7  --    --   --   --   PHOS 2.8  --  3.6  --  2.9  --   --   --   --   --   --   --   --   --   --   TSH  --   --   --   --   --   --  7.79*  --  39.015*  --   --   --  4.10  --   --    < > = values in this interval not displayed.   Liver Function Tests: Recent Labs    07/31/19 0010 07/31/19 0010 08/06/19 1221 09/06/19 1156 09/17/19 0824  AST 25   < > 21 20 20   ALT 21   < > 15 12 12   ALKPHOS 59  --  55  --  69  BILITOT 0.7   < > 0.8 0.5 0.6  PROT 6.3*   < > 6.5 7.1 6.9  ALBUMIN 3.4*  --  3.5  --  4.1   < > = values in this interval not displayed.   No results for input(s): LIPASE, AMYLASE in the last 8760 hours. No results for input(s): AMMONIA in the last 8760 hours. CBC: Recent Labs    08/06/19 1221 08/06/19 1221 08/07/19 0436 08/07/19 0436 08/23/19 0000 09/06/19 1156 09/17/19 0824  WBC 6.4   < > 5.4  --  3.9 4.3 3.7*  NEUTROABS 4.3  --   --   --   --  2,511 1.1*  HGB 12.4   < > 11.1*   < > 11.5* 12.7 13.2  HCT 38.9   < > 33.5*   < > 35* 38.2 40.7  MCV 93.5   < > 88.9  --   --  89.0 91.3  PLT 233   < > 224   < > 179 227 181   < > = values in this interval not displayed.   Lipid Panel: Recent Labs    02/06/19 1229  CHOL 226*  HDL 81  LDLCALC 126*  TRIG 86  CHOLHDL 2.8   Lab Results  Component Value Date   HGBA1C 6.5 (H) 09/06/2019    Assessment/Plan 1. Vascular dementia with behavior disturbance ([place]) -has some agitation at times and also has had hallucinations  -continues on seroquel for these and trazodone for sleep  2. Overactive bladder -reminded pt and daughter that she has this so checking her urine w/o NEW symptoms is not appropriate  3. Chronic radicular low back pain -per pain mgt  Labs/tests ordered:   Lab Orders  No laboratory test(s) ordered today    Next appt:  No new--pt to be moving to [place]. Redell Nazir, D.O. [facility] [address], [place] [address] Cell Phone (Mon-Fri  8am-5pm):  [phone] On Call:  [phone] & follow prompts after 5pm & weekends Office Phone:  [phone] Office Fax:  [phone]

## 2019-12-06 ENCOUNTER — Other Ambulatory Visit: Payer: Self-pay | Admitting: Internal Medicine

## 2019-12-11 DIAGNOSIS — F0390 Unspecified dementia without behavioral disturbance: Secondary | ICD-10-CM | POA: Diagnosis not present

## 2019-12-11 DIAGNOSIS — G894 Chronic pain syndrome: Secondary | ICD-10-CM | POA: Diagnosis not present

## 2019-12-11 DIAGNOSIS — M961 Postlaminectomy syndrome, not elsewhere classified: Secondary | ICD-10-CM | POA: Diagnosis not present

## 2019-12-13 DIAGNOSIS — M6281 Muscle weakness (generalized): Secondary | ICD-10-CM | POA: Diagnosis not present

## 2019-12-13 DIAGNOSIS — R2681 Unsteadiness on feet: Secondary | ICD-10-CM | POA: Diagnosis not present

## 2019-12-14 ENCOUNTER — Encounter: Payer: Medicare Other | Admitting: Diagnostic Neuroimaging

## 2019-12-15 ENCOUNTER — Institutional Professional Consult (permissible substitution): Payer: Medicare Other | Admitting: Neurology

## 2019-12-18 DIAGNOSIS — M6281 Muscle weakness (generalized): Secondary | ICD-10-CM | POA: Diagnosis not present

## 2019-12-18 DIAGNOSIS — R2681 Unsteadiness on feet: Secondary | ICD-10-CM | POA: Diagnosis not present

## 2019-12-20 DIAGNOSIS — M6281 Muscle weakness (generalized): Secondary | ICD-10-CM | POA: Diagnosis not present

## 2019-12-20 DIAGNOSIS — R2681 Unsteadiness on feet: Secondary | ICD-10-CM | POA: Diagnosis not present

## 2019-12-25 DIAGNOSIS — M6281 Muscle weakness (generalized): Secondary | ICD-10-CM | POA: Diagnosis not present

## 2019-12-25 DIAGNOSIS — R2681 Unsteadiness on feet: Secondary | ICD-10-CM | POA: Diagnosis not present

## 2019-12-26 ENCOUNTER — Telehealth: Payer: Self-pay

## 2019-12-26 ENCOUNTER — Other Ambulatory Visit: Payer: Self-pay | Admitting: Family

## 2019-12-26 ENCOUNTER — Other Ambulatory Visit: Payer: Self-pay | Admitting: Internal Medicine

## 2019-12-26 DIAGNOSIS — I10 Essential (primary) hypertension: Secondary | ICD-10-CM

## 2019-12-26 DIAGNOSIS — E1169 Type 2 diabetes mellitus with other specified complication: Secondary | ICD-10-CM

## 2019-12-26 NOTE — Telephone Encounter (Signed)
I wrote right on that form that I didn't know how to answer the end of it.  Not sure what else we can do.

## 2019-12-26 NOTE — Telephone Encounter (Signed)
Will send to [place], DO to confirm ok to provide 30 day supply of both requested medications.   Per last ov note patient is moving to [place]

## 2019-12-26 NOTE — Telephone Encounter (Signed)
[name] has called in reference to some unfinished paperwork filled out for Ms. Joy Ward. She is with the [employer] in [place]. She stated she will fax over what she has on 12/27/2019 so we can see what is missing on the form that was filled out.

## 2019-12-27 DIAGNOSIS — M6281 Muscle weakness (generalized): Secondary | ICD-10-CM | POA: Diagnosis not present

## 2019-12-27 DIAGNOSIS — R2681 Unsteadiness on feet: Secondary | ICD-10-CM | POA: Diagnosis not present

## 2020-01-01 DIAGNOSIS — M6281 Muscle weakness (generalized): Secondary | ICD-10-CM | POA: Diagnosis not present

## 2020-01-01 DIAGNOSIS — R2681 Unsteadiness on feet: Secondary | ICD-10-CM | POA: Diagnosis not present

## 2020-01-02 DIAGNOSIS — M6281 Muscle weakness (generalized): Secondary | ICD-10-CM | POA: Diagnosis not present

## 2020-01-02 DIAGNOSIS — R2681 Unsteadiness on feet: Secondary | ICD-10-CM | POA: Diagnosis not present

## 2020-01-03 ENCOUNTER — Telehealth: Payer: Self-pay | Admitting: Internal Medicine

## 2020-01-03 DIAGNOSIS — M6281 Muscle weakness (generalized): Secondary | ICD-10-CM | POA: Diagnosis not present

## 2020-01-03 DIAGNOSIS — R2681 Unsteadiness on feet: Secondary | ICD-10-CM | POA: Diagnosis not present

## 2020-01-03 NOTE — Telephone Encounter (Signed)
Ok to make her voltaren gel prn.  Please call facility and update Rx in epic.

## 2020-01-03 NOTE — Telephone Encounter (Signed)
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  Patient currently has order for Voltaren for three times daily.  An order is requested for this to be PRN.   Please call Brittney at [phone].  Thank you  Adan Sis

## 2020-01-05 ENCOUNTER — Telehealth: Payer: Self-pay

## 2020-01-05 NOTE — Telephone Encounter (Signed)
I called [facility]  with the  New order for Voltaren Gel PRN, I also updated it on her med list. [phone]

## 2020-01-05 NOTE — Telephone Encounter (Signed)
Called [facility] and gave her a verbal order. She said she would fax order over for signature.

## 2020-01-05 NOTE — Telephone Encounter (Signed)
Joy Ward is going to re-fax the paper work, she stated that she and I will go through the form and fill out the parts that you don't know the information to and then you you can do your part.

## 2020-01-05 NOTE — Telephone Encounter (Signed)
Roselyn Reef, nurse from patient's facility, called and stated patient is on furosemide 40 mg, but has had a 4 lb weight gain since yesterday and her feet are swollen.

## 2020-01-05 NOTE — Telephone Encounter (Signed)
Ok, the only thing I didn't do was the very last section about PASSR.

## 2020-01-05 NOTE — Telephone Encounter (Signed)
I would like for them to give her two doses of lasix 40mg  for 3 days (in the am and at noon).  They should also give her two doses of potassium 37meq with the lasix.  After the three days, she will return to her current treatment.

## 2020-01-09 DIAGNOSIS — M6281 Muscle weakness (generalized): Secondary | ICD-10-CM | POA: Diagnosis not present

## 2020-01-09 DIAGNOSIS — R2681 Unsteadiness on feet: Secondary | ICD-10-CM | POA: Diagnosis not present

## 2020-01-11 ENCOUNTER — Encounter (INDEPENDENT_AMBULATORY_CARE_PROVIDER_SITE_OTHER): Payer: Medicare Other | Admitting: Diagnostic Neuroimaging

## 2020-01-11 ENCOUNTER — Other Ambulatory Visit: Payer: Self-pay

## 2020-01-11 ENCOUNTER — Ambulatory Visit (INDEPENDENT_AMBULATORY_CARE_PROVIDER_SITE_OTHER): Payer: Medicare Other | Admitting: Diagnostic Neuroimaging

## 2020-01-11 DIAGNOSIS — G5602 Carpal tunnel syndrome, left upper limb: Secondary | ICD-10-CM

## 2020-01-11 DIAGNOSIS — Z0289 Encounter for other administrative examinations: Secondary | ICD-10-CM

## 2020-01-11 NOTE — Procedures (Signed)
[facility]  NCS (NERVE CONDUCTION STUDY) WITH EMG (ELECTROMYOGRAPHY) REPORT   STUDY DATE: 01/11/20 PATIENT NAME: Joy Ward DOB: 30-Dec-1940 MRN: [id number]  ORDERING CLINICIAN: Larey Seat, MD   TECHNOLOGIST: Sherre Scarlet ELECTROMYOGRAPHER: Earlean Polka. Rickard Kennerly, MD  CLINICAL INFORMATION: 79 year old female with bilateral hand numbness.  History of diabetes and cervical degenerative spine disease.  FINDINGS: NERVE CONDUCTION STUDY:  Bilateral median motor responses are prolonged is latencies, decreased amplitudes and normal conduction velocities.  Bilateral ulnar motor responses are normal.  Bilateral median sensory responses could not be obtained.  Bilateral ulnar sensory responses are normal.  Bilateral median and ulnar F-wave latencies are prolonged.   NEEDLE ELECTROMYOGRAPHY:  Needle examination of left upper extremity is normal.   IMPRESSION:   Abnormal study demonstrating: -Bilateral median neuropathies at the wrist consistent with bilateral carpal tunnel syndrome. -Electrodiagnostic evidence of underlying mild cervical polyradiculopathy versus polyneuropathy.      INTERPRETING PHYSICIAN:  Penni Bombard, MD Certified in Neurology, Neurophysiology and Neuroimaging  [facility] [address] [place], [place] [address] [phone]   [facility]    Nerve / Sites Muscle Latency Ref. Amplitude Ref. Rel Amp Segments Distance Velocity Ref. Area    ms ms mV mV %  cm m/s m/s mVms  L Median - APB     Wrist APB 14.8 ?4.4 0.6 ?4.0 100 Wrist - APB 7   1.7     Upper arm APB 17.8  1.4  254 Upper arm - Wrist 22 73 ?49 11.6  R Median - APB     Wrist APB 8.6 ?4.4 3.3 ?4.0 100 Wrist - APB 7   15.1     Upper arm APB 13.5  3.2  97.4 Upper arm - Wrist 24 49 ?49 18.9  L Ulnar - ADM     Wrist ADM 3.3 ?3.3 8.4 ?6.0 100 Wrist - ADM 7   28.5     B.Elbow ADM 7.3  7.2  85.4 B.Elbow - Wrist 20 50 ?49 27.3     A.Elbow ADM 9.3   7.0  97.8 A.Elbow - B.Elbow 10 50 ?49 28.1  R Ulnar - ADM     Wrist ADM 3.3 ?3.3 8.2 ?6.0 100 Wrist - ADM 7   32.7     B.Elbow ADM 7.6  6.7  82.5 B.Elbow - Wrist 21 49 ?49 31.7     A.Elbow ADM 9.6  7.0  104 A.Elbow - B.Elbow 10 49 ?49 32.6             SNC    Nerve / Sites Rec. Site Peak Lat Ref.  Amp Ref. Segments Distance    ms ms V V  cm  L Median - Orthodromic (Dig II, Mid palm)     Dig II Wrist NR ?3.4 NR ?10 Dig II - Wrist 13  R Median - Orthodromic (Dig II, Mid palm)     Dig II Wrist NR ?3.4 NR ?10 Dig II - Wrist 13  L Ulnar - Orthodromic, (Dig V, Mid palm)     Dig V Wrist 3.1 ?3.1 7 ?5 Dig V - Wrist 11  R Ulnar - Orthodromic, (Dig V, Mid palm)     Dig V Wrist 3.1 ?3.1 12 ?5 Dig V - Wrist 3               F  Wave    Nerve F Lat Ref.   ms ms  L Median - APB 40.5 ?31.0  L Ulnar - ADM 34.6 ?32.0  R Median - APB 36.8 ?31.0  R Ulnar - ADM 34.6 ?32.0             EMG Summary Table    Spontaneous MUAP Recruitment  Muscle IA Fib PSW Fasc Other Amp Dur. Poly Pattern  L. Deltoid Normal None None None _______ Normal Normal Normal Normal  L. Biceps brachii Normal None None None _______ Normal Normal Normal Normal  L. Triceps brachii Normal None None None _______ Normal Normal Normal Normal  L. Flexor carpi radialis Normal None None None _______ Normal Normal Normal Normal  L. First dorsal interosseous Normal None None None _______ Normal Normal Normal Normal

## 2020-01-15 ENCOUNTER — Telehealth: Payer: Self-pay | Admitting: Neurology

## 2020-01-15 NOTE — Telephone Encounter (Signed)
Medicare/medicaid order sent to [facility]. No auth they will reach out to the patient to schedule.  °

## 2020-01-15 NOTE — Progress Notes (Signed)
NEEDLE ELECTROMYOGRAPHY:   Needle examination of left upper extremity is normal.    IMPRESSION:   Abnormal study demonstrating:  -Bilateral median neuropathies at the wrist consistent with  bilateral carpal tunnel syndrome.  -Electrodiagnostic evidence of underlying mild cervical  polyradiculopathy versus polyneuropathy.      INTERPRETING PHYSICIAN:  Penni Bombard, MD    PS: will follow with evaluation by cervical MRI.

## 2020-01-15 NOTE — Progress Notes (Signed)
I ordered c spine MRI follow up.

## 2020-01-15 NOTE — Addendum Note (Signed)
Addended by: Larey Seat on: 01/15/2020 09:07 AM   Modules accepted: Orders

## 2020-01-16 ENCOUNTER — Telehealth: Payer: Self-pay | Admitting: Neurology

## 2020-01-16 ENCOUNTER — Encounter: Payer: Self-pay | Admitting: Neurology

## 2020-01-16 ENCOUNTER — Telehealth: Payer: Self-pay

## 2020-01-16 DIAGNOSIS — M5136 Other intervertebral disc degeneration, lumbar region: Secondary | ICD-10-CM | POA: Diagnosis not present

## 2020-01-16 DIAGNOSIS — M961 Postlaminectomy syndrome, not elsewhere classified: Secondary | ICD-10-CM | POA: Diagnosis not present

## 2020-01-16 NOTE — Telephone Encounter (Signed)
She has some venous insufficiency and as I've explained to her and Joy Ward at the appts, compression hose on in am and off at hs are best for this.  She has difficulty with urinary incontinence and daily diuretics only worsen that.  She has historically refused to wear the compression hose and elevate her feet as directed.  The use of regular diuretics also requires frequent lab testing to check electrolytes.

## 2020-01-16 NOTE — Telephone Encounter (Signed)
Message given to Roselyn Reef she said ok and thank you.

## 2020-01-16 NOTE — Telephone Encounter (Signed)
Called the patient, there was no answer. LVM asking pt to call back or can reply to mychart.

## 2020-01-16 NOTE — Telephone Encounter (Signed)
-----   Message from Larey Seat, MD sent at 01/15/2020  9:07 AM EDT ----- I ordered c spine MRI follow up.

## 2020-01-16 NOTE — Telephone Encounter (Signed)
Jamie from [facility] called and stated the Joy Ward  Ankles and feet a swollen again, she said this happened last week and you prescribed furosemide x 3 days but as soon as it stopped the swelling came back.  Please advise .

## 2020-01-18 DIAGNOSIS — Z23 Encounter for immunization: Secondary | ICD-10-CM | POA: Diagnosis not present

## 2020-01-22 ENCOUNTER — Other Ambulatory Visit: Payer: Self-pay | Admitting: Family

## 2020-01-22 ENCOUNTER — Other Ambulatory Visit: Payer: Self-pay | Admitting: Internal Medicine

## 2020-01-22 ENCOUNTER — Telehealth: Payer: Self-pay

## 2020-01-22 DIAGNOSIS — I10 Essential (primary) hypertension: Secondary | ICD-10-CM

## 2020-01-22 DIAGNOSIS — R600 Localized edema: Secondary | ICD-10-CM

## 2020-01-22 DIAGNOSIS — E785 Hyperlipidemia, unspecified: Secondary | ICD-10-CM

## 2020-01-22 MED ORDER — DICLOFENAC SODIUM 1 % EX GEL: 4.0000 g | Freq: Every day | CUTANEOUS | 11 refills | Status: DC | PRN

## 2020-01-22 NOTE — Telephone Encounter (Signed)
Paperwork was finished last week.

## 2020-01-22 NOTE — Telephone Encounter (Signed)
According to Dr.Reed's last note patient was moving to [place].  I tried to call [name] (patients daughter) to confirm if patient has moved or not. Linda's phone rung continuously.   I called

## 2020-01-22 NOTE — Telephone Encounter (Signed)
Joy Ward Peter's called to see if Dr.Reed ever received and completed paperwork for patient to relocate to [place].   Patient is still in [place] due to incomplete paperwork and patient lost spot at facility that her daughter was trying to transfer her to in [place]. Joy Ward is going to try to get her into another facility yet would like confirmation that paperwork was completed.  I see a document scanned into epic under media that appears to be complete. I will send to Dr.Reed and her medical assistant to confirm.  Joy Ward has also requested a refill on Voltaren Gel to be sent to local pharmacy. (please refer to telephone encounter dated 01/03/2020). I am not sure what the dispense number was on 01/05/2020. RX is pending for Dr.Reed to send to [place]

## 2020-01-22 NOTE — Telephone Encounter (Signed)
Last filled on 12/26/2019 refill to soon.

## 2020-01-23 ENCOUNTER — Other Ambulatory Visit: Payer: Self-pay | Admitting: Internal Medicine

## 2020-01-23 ENCOUNTER — Telehealth: Payer: Self-pay

## 2020-01-23 ENCOUNTER — Ambulatory Visit: Payer: Medicare Other | Attending: Internal Medicine

## 2020-01-23 DIAGNOSIS — Z23 Encounter for immunization: Secondary | ICD-10-CM

## 2020-01-23 NOTE — Telephone Encounter (Signed)
Patients daughter [name] called to question if it is ok for patient to get her covid-19 booster today. Patient had flu vaccine 4 days ago.  I called Dr.Reed on another line and placed Linda on hold while I consulted with Dr.Reed to get an answer to her question. Per Dr.Reed ok to get covid-19 vaccine today.  I shared this information with [name] and she appeared satisfied with response.

## 2020-01-23 NOTE — Telephone Encounter (Signed)
The paper work was re-filled out and faxed back  [place] on 01/17/2020, the copies were then sent to scan just in case we needed another copy.

## 2020-01-23 NOTE — Telephone Encounter (Signed)
Discussed Dr.Reed's response with patients daughter Levada Dy. Levada Dy states she will call to confirm that they received paperwork and that nothing further is needed for our office.

## 2020-01-23 NOTE — Progress Notes (Signed)
   Covid-19 Vaccination Clinic  Name:  Joy Ward    MRN: [id number] DOB: 19-Jun-1940  01/23/2020  Joy Ward was observed post Covid-19 immunization for 15 minutes without incident. She was provided with Vaccine Information Sheet and instruction to access the V-Safe system.   Joy Ward was instructed to call 911 with any severe reactions post vaccine: Marland Kitchen Difficulty breathing  . Swelling of face and throat  . A fast heartbeat  . A bad rash all over body  . Dizziness and weakness

## 2020-01-30 ENCOUNTER — Ambulatory Visit: Payer: Medicare Other | Admitting: Adult Health

## 2020-01-30 DIAGNOSIS — Z20828 Contact with and (suspected) exposure to other viral communicable diseases: Secondary | ICD-10-CM | POA: Diagnosis not present

## 2020-02-01 ENCOUNTER — Other Ambulatory Visit: Payer: Self-pay | Admitting: Internal Medicine

## 2020-02-08 ENCOUNTER — Other Ambulatory Visit: Payer: Self-pay

## 2020-02-08 ENCOUNTER — Ambulatory Visit
Admission: RE | Admit: 2020-02-08 | Discharge: 2020-02-08 | Disposition: A | Discharge status: Home / Self Care | Payer: Medicare Other | Source: Ambulatory Visit | Attending: Neurology | Admitting: Neurology

## 2020-02-08 DIAGNOSIS — G5602 Carpal tunnel syndrome, left upper limb: Secondary | ICD-10-CM

## 2020-02-08 DIAGNOSIS — Z20828 Contact with and (suspected) exposure to other viral communicable diseases: Secondary | ICD-10-CM | POA: Diagnosis not present

## 2020-02-16 ENCOUNTER — Telehealth: Payer: Self-pay

## 2020-02-16 NOTE — Telephone Encounter (Signed)
I had filled it in based on what her daughter was saying b/c I had put that she needed help and she said she didn't!  I'm not there so it's hard to tell!!!  Yes we can do it again.  Hopefully just updating that page.

## 2020-02-16 NOTE — Telephone Encounter (Signed)
The paper work that was filled out was correct, however page 3 question 2 under functional ability. It ask Does the patient need help with activities of daily living you checked yes (error) then no. Latifah stated that the more dependent, the better her chances are at getting into the facility. By the way it's worded she stated that it seems like she very independent and does not need help. I let her know that Mr. Kaelene was in a facility now. She is resending the paper work AGAIN. I have gotten the SS# because that was part of the process as well.  She emailed me a copy and we will try this again

## 2020-02-17 ENCOUNTER — Other Ambulatory Visit: Payer: Self-pay | Admitting: Internal Medicine

## 2020-02-17 DIAGNOSIS — I10 Essential (primary) hypertension: Secondary | ICD-10-CM

## 2020-02-19 ENCOUNTER — Other Ambulatory Visit: Payer: Self-pay | Admitting: Internal Medicine

## 2020-02-19 NOTE — Telephone Encounter (Signed)
rx sent to pharmacy by e-script  

## 2020-02-20 ENCOUNTER — Ambulatory Visit (INDEPENDENT_AMBULATORY_CARE_PROVIDER_SITE_OTHER): Payer: Medicare Other | Admitting: Neurology

## 2020-02-20 ENCOUNTER — Encounter: Payer: Self-pay | Admitting: Neurology

## 2020-02-20 VITALS — BP 142/84 | HR 77 | Ht 64.0 in | Wt 158.0 lb

## 2020-02-20 DIAGNOSIS — E1142 Type 2 diabetes mellitus with diabetic polyneuropathy: Secondary | ICD-10-CM | POA: Diagnosis not present

## 2020-02-20 DIAGNOSIS — G5602 Carpal tunnel syndrome, left upper limb: Secondary | ICD-10-CM | POA: Diagnosis not present

## 2020-02-20 NOTE — Patient Instructions (Addendum)
These bilateral median neuropathies are consistent with bilateral carpal tunnel syndrome and the patient may feel better using a wrist brace preventing her from kinking the joint especially at night, if this has not helped in the past or has been currently already used the next step would be a hand surgery referral for surgical correction.  Please note that control of diabetes is essential in preventing polyneuropathy and mononeuropathies.  Carpal Tunnel Syndrome  Carpal tunnel syndrome is a condition that causes pain in your hand and arm. The carpal tunnel is a narrow area that is on the palm side of your wrist. Repeated wrist motion or certain diseases may cause swelling in the tunnel. This swelling can pinch the main nerve in the wrist (median nerve). What are the causes? This condition may be caused by:  Repeated wrist motions.  Wrist injuries.  Arthritis.  A sac of fluid (cyst) or abnormal growth (tumor) in the carpal tunnel.  Fluid buildup during pregnancy. Sometimes the cause is not known. What increases the risk? The following factors may make you more likely to develop this condition:  Having a job in which you move your wrist in the same way many times. This includes jobs like being a Software engineer or a Scientist, water quality.  Being a woman.  Having other health conditions, such as: ? Diabetes. ? Obesity. ? A thyroid gland that is not active enough (hypothyroidism). ? Kidney failure. What are the signs or symptoms? Symptoms of this condition include:  A tingling feeling in your fingers.  Tingling or a loss of feeling (numbness) in your hand.  Pain in your entire arm. This pain may get worse when you bend your wrist and elbow for a long time.  Pain in your wrist that goes up your arm to your shoulder.  Pain that goes down into your palm or fingers.  A weak feeling in your hands. You may find it hard to grab and hold items. You may feel worse at night. How is this diagnosed? This  condition is diagnosed with a medical history and physical exam. You may also have tests, such as:  Electromyogram (EMG). This test checks the signals that the nerves send to the muscles.  Nerve conduction study. This test checks how well signals pass through your nerves.  Imaging tests, such as X-rays, ultrasound, and MRI. These tests check for what might be the cause of your condition. How is this treated? This condition may be treated with:  Lifestyle changes. You will be asked to stop or change the activity that caused your problem.  Doing exercise and activities that make bones and muscles stronger (physical therapy).  Learning how to use your hand again (occupational therapy).  Medicines for pain and swelling (inflammation). You may have injections in your wrist.  A wrist splint.  Surgery. Follow these instructions at home: If you have a splint:  Wear the splint as told by your doctor. Remove it only as told by your doctor.  Loosen the splint if your fingers: ? Tingle. ? Lose feeling (become numb). ? Turn cold and blue.  Keep the splint clean.  If the splint is not waterproof: ? Do not let it get wet. ? Cover it with a watertight covering when you take a bath or a shower. Managing pain, stiffness, and swelling   If told, put ice on the painful area: ? If you have a removable splint, remove it as told by your doctor. ? Put ice in a plastic bag. ?  Place a towel between your skin and the bag. ? Leave the ice on for 20 minutes, 2-3 times per day. General instructions  Take over-the-counter and prescription medicines only as told by your doctor.  Rest your wrist from any activity that may cause pain. If needed, talk with your boss at work about changes that can help your wrist heal.  Do any exercises as told by your doctor, physical therapist, or occupational therapist.  Keep all follow-up visits as told by your doctor. This is important. Contact a doctor  if:  You have new symptoms.  Medicine does not help your pain.  Your symptoms get worse. Get help right away if:  You have very bad numbness or tingling in your wrist or hand. Summary  Carpal tunnel syndrome is a condition that causes pain in your hand and arm.  It is often caused by repeated wrist motions.  Lifestyle changes and medicines are used to treat this problem. Surgery may help in very bad cases.  Follow your doctor's instructions about wearing a splint, resting your wrist, keeping follow-up visits, and calling for help. This information is not intended to replace advice given to you by your health care provider. Make sure you discuss any questions you have with your health care provider. Document Revised: 08/20/2017 Document Reviewed: 08/20/2017 Elsevier Patient Education  [place].

## 2020-02-20 NOTE — Progress Notes (Signed)
[facility]INE [facility]IC   Provider:  Larey Seat, M D  Primary Care Physician:  Gayland Curry, DO   Referring Provider:   Chief Complaint  Patient presents with  . New Patient (Initial Visit)    pt alone, rm 10. presents today for ongoing left arm/hand numbness and tingling. this started almost year ago when she woke up with pain and since then it has progressed.- sensation in the right hand is newer - but mostly in the left.     HPI: INTERVAL history:  02-20-2020 -I have the pleasure of seeing Ms. Joy Ward today, a 79 year old female patient who presented with left over right hand pain especially the middle finger and index fingers and had meanwhile undergone an EMG and nerve conduction study in September.  This was on 01-11-2020 performed by Dr. Andrey Spearman and he documented bilateral median nerve damage the motor responses were prolonged amplitudes were decreased conduction velocity was normal but the sensory responses could not be obtained.  We compared this to the bilateral ulnar nerve function which was intact.  These bilateral median neuropathies are consistent with bilateral carpal tunnel syndrome and the patient may feel better using a wrist brace preventing her from kinking the joint especially at night, if this has not helped in the past or has been currently already used the next step would be a hand surgery referral for surgical correction.  Please note that control of diabetes is essential in preventing polyneuropathy and mononeuropathies.  I reviewed her current medication.     Athene Florek is a 79 y.o. female , was seen here in 06-2017 upon  a referral  from  Dr. Lucianne Lei, and Dr Nelva Bush for excessive daytime sleepiness. She now returns on 7-29-202 for left arm numbness and tingling in all 4 fingers, with the exception of the pinky- the middle finger is painful and so is the index finger- this wakes her now from sleep - has happened over the last 3-4 month. Dr. Hollace Kinnier referred her-  She woke up in early AM, and is now hurting in daytime. Pain comes with finger flexion.  She reports that some numbing cream was applied and has helped some- but it doesn't last. Now the right hand fingertips are getting numbish.    07-24-2019 The patient is a 79 year old African-American right-handed lady, who has been followed by Dr. Nelva Bush . She had mentioned to him that she sleeps 12 or more hours each day and this hypersomnia is the reason for today's referral.  Reports that she has been excessively daytime sleepy since 2016.  She has a history of thyroidectomy, trochanteric bursitis of both hips, degenerative disc disease, neuroforaminal stenosis of the lumbar spine, osteoarthritis, plantar fasciitis, she is a former smoker, hypertension, allergic rhinitis, diabetes and glaucoma.  Chief complaint according to patient :" I am so very sleepy, and forgetful." She has used the following medications for pain control :hydrocodone and Ultram(pain level can be around 4-5 out of 10 points and she has received cortisone injections, the last one in September 2018 for back pain, no history of doctor shopping, she has not shown addiction problems)- but she also mentioned memory issues.  Patient has lived in different states over the last 20 years and some of her medical history dates back to when she lived in [place], other parts to [place]. In  2012  She underwent thyroidectomy, and became sensitive to cold. Insomnia has been a problem for decades. About 20 years ago  the patient had menopausal sleep problems, could not go to sleep and was given Ambien then as a brand name.  She has taken Ambien  10 mg for all these years but stopped it when she became excessively daytime sleepy. Now she seems to be able to go to sleep without any sleep aid -her problem is EDS.  She is now taking an Ambien still 1 or 2 days out of 14 days. Much less than before.  Sleep habits are as follows: The  patient reports a bedtime between midnight and 0:30 AM.  She has tried to go earlier to bed only to find herself tossing and turning and not be able to go to sleep.  She has seen a sleep doctor in [place] before who told her that she should leave the bed instead of staying.  She often leaves the bedroom and watches TV, then may return to the bedroom at about 1 AM again, she has about 3 bathroom breaks each night. Prefers to sleep on her right side but struggles with back pain and bursitis hip pain. She is struggling with daytime sleepiness right after she wakes up.  She may wake up as early as 8 AM but she may not rise until 2:00 some days. She has tried to get up earlier but then she falls asleep easily again.  Right now she is so sleepy she does not trust herself driving.   Sleep medical history and family sleep history: All brothers have predeceased her and her sister only living sibling has Alzheimer. I reviewed the medication list.   Social history: raised 7 children, husband left when the youngest was 76 years of age, oldest is now 35 born in [birth year], youngest is now 36 , born in [birth year]. The patient moved from [place] to [place], and back to her [place] [address] in [place]. The patient was a former smoker but quit over 50 years ago, she at the same time stop drinking alcohol, caffeine use form of ice tea, but not coffee or soda.For on her years of employment she was in earlier to bed and early to rise.  Menopause seems to have changed her sleep pattern and she has more morbid, delayed sleep phase person.  She also has several medications that could account for excessive daytime sleepiness including Ambien currently only prescribed at 5 mg Zanaflex 4 mg tablets which she said she does not take anymore and Norco 10/325 mg tablets   Review of Systems: Out of a complete 14 system review, the patient complains of only the following symptoms, and all other reviewed systems are negative. She  cries a lot, is forgetful, excessively daytime sleepy, did not endorse fatigue or depression but does have delayed sleep phase. Her sleep test showed no apnea-  Epworth score 14/ 24   ,   Social History   Socioeconomic History  . Marital status: Single    Spouse name: Not on file  . Number of children: 7  . Years of education: 16  . Highest education level: Not on file  Occupational History  . Occupation: pastor--travels a lot.  Tobacco Use  . Smoking status: Former Research scientist (life sciences)  . Smokeless tobacco: Never Used  . Tobacco comment: No smoking since 79 yo - smoked 10 yrs  Vaping Use  . Vaping Use: Never used  Substance and Sexual Activity  . Alcohol use: No  . Drug use: No  . Sexual activity: Not on file  Other Topics Concern  .  Not on file  Social History Narrative   Lives alone in [place].   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   Social Determinants of Health   Financial Resource Strain:   . Difficulty of Paying Living Expenses: Not on file  Food Insecurity:   . Worried About Charity fundraiser in the Last Year: Not on file  . Ran Out of Food in the Last Year: Not on file  Transportation Needs:   . Lack of Transportation (Medical): Not on file  . Lack of Transportation (Non-Medical): Not on file  Physical Activity:   . Days of Exercise per Week: Not on file  . Minutes of Exercise per Session: Not on file  Stress:   . Feeling of Stress : Not on file  Social Connections:   . Frequency of Communication with Friends and Family: Not on file  . Frequency of Social Gatherings with Friends and Family: Not on file  . Attends Religious Services: Not on file  . Active Member of Clubs or Organizations: Not on file  . Attends Archivist Meetings: Not on file  . Marital Status: Not on file  Intimate Partner Violence:   . Fear of Current or Ex-Partner: Not on file  . Emotionally Abused: Not on file  . Physically Abused:  Not on file  . Sexually Abused: Not on file    Family History  Problem Relation Age of Onset  . Heart disease Mother        unclear details "atherosclerosis"  . Diabetes Mother   . Alcohol abuse Sister   . Diabetes Brother   . Diabetes Brother   . Drug abuse Brother   . Diabetes Daughter   . Heart disease Son        CHF  . Alcohol abuse Son     Past Medical History:  Diagnosis Date  . Allergy   . Arthritis   . Chronic back pain   . DDD (degenerative disc disease), lumbar   . Diabetes mellitus without complication ([place])   . Diabetes type 2, uncontrolled ([place]) 09/18/2006   Annotation: Noninsulin dependent Qualifier: Diagnosis of  By: Amil Amen MD, Benjamine Mola    . Former tobacco use   . Hyperlipidemia    a. H/o muscle aches with statins per PCP notes.  . Hypertension   . Hypothyroidism    a. prior thyroid removal.  . Memory loss or impairment 03/29/2019  . Stroke [facility])    a. Listed in PCP notes: "History of stroke:  History of terrible headache.  Had a CT scan of brain and found "ministroke"  Was removed from ERT subsequently. "    Past Surgical History:  Procedure Laterality Date  . ABDOMINAL HYSTERECTOMY  1994   TAH, not clear if unilateral oophorectomy as well.  Marland Kitchen [place]  . CHOLECYSTECTOMY  1973   open  . DECOMPRESSIVE LUMBAR LAMINECTOMY LEVEL 4  2005   In [place]  . THYROIDECTOMY  2012   multinodular goiter    Current Outpatient Medications  Medication Sig Dispense Refill  . acetaminophen (TYLENOL) 500 MG tablet Take 500 mg by mouth every 6 (six) hours as needed (fever 99.5-101, minor headache, minor discomfort).    Marland Kitchen amLODipine (NORVASC) 5 MG tablet TAKE 1 TABLET BY MOUTH 1 time DAILY 30 tablet 5  . bisacodyl (DULCOLAX) 10 MG suppository Place 1 suppository (10 mg total) rectally daily as needed  for moderate constipation. 12 suppository 0  . diclofenac Sodium (VOLTAREN) 1 % GEL Apply 4 g topically daily as needed. 45 g 11  .  donepezil (ARICEPT) 5 MG tablet TAKE 1 TABLET BY MOUTH AT BEDTIME 30 tablet 5  . furosemide (LASIX) 20 MG tablet TAKE 2 TABLETS BY MOUTH EVERY DAY 60 tablet 0  . HYDROcodone-acetaminophen (NORCO) 10-325 MG tablet Take 1 tablet by mouth every 6 (six) hours as needed for moderate pain.     Marland Kitchen levothyroxine (SYNTHROID) 100 MCG tablet TAKE 1 TABLET BY MOUTH EVERY DAY 30 tablet 2  . loperamide (IMODIUM) 2 MG capsule Take 2 mg by mouth as needed for diarrhea or loose stools (do not exceed 8 dsoses in 24 hours).    . loratadine (CLARITIN) 10 MG tablet Take 1 tablet (10 mg total) by mouth daily. 30 tablet 11  . magnesium hydroxide (MILK OF MAGNESIA) 400 MG/5ML suspension Take 30 mLs by mouth at bedtime as needed (constipation). 355 mL 0  . metFORMIN (GLUCOPHAGE) 500 MG tablet Take 1 tablet (500 mg total) by mouth daily. 90 tablet 1  . metoprolol tartrate (LOPRESSOR) 25 MG tablet Take 12.5 mg by mouth 2 (two) times daily.    Marland Kitchen Neomycin-Bacitracin-Polymyxin (TRIPLE ANTIBIOTIC) [phone] OINT Apply topically as needed.    . Omega-3 Fatty Acids (FISH OIL) 1000 MG CAPS Take 1,000 mg by mouth daily.     . polyethylene glycol (MIRALAX / GLYCOLAX) 17 g packet Take 17 g by mouth daily. 14 each 0  . potassium chloride SA (KLOR-CON) 20 MEQ tablet TAKE 1 TABLET BY MOUTH EVERY DAY 30 tablet 0  . pravastatin (PRAVACHOL) 20 MG tablet TAKE 1 TABLET BY MOUTH EVERY DAY AT 6 PM. 30 tablet 0  . QUEtiapine (SEROQUEL) 100 MG tablet TAKE 1 TABLET BY MOUTH NIGHTLY AT BEDTIME 30 tablet 0  . traZODone (DESYREL) 100 MG tablet TAKE 1 TABLET BY MOUTH AT BEDTIME AS NEEDED FOR SLEEP 30 tablet 0   No current facility-administered medications for this visit.    Allergies as of 02/20/2020 - Review Complete 02/20/2020  Allergen Reaction Noted  . Clindamycin hcl Other (See Comments) 10/15/2006  . Sulfamethoxazole-trimethoprim Other (See Comments) 02/06/2019  . Zanaflex [tizanidine hcl] Anaphylaxis 10/15/2016  . Metronidazole Hives  09/18/2006  . Statins Other (See Comments) 05/02/2016  . Bee venom Other (See Comments) 06/24/2019  . Ace inhibitors Cough 09/18/2006  . Adhesive [tape] Itching, Rash, and Other (See Comments) 04/22/2016    Vitals: BP (!) 142/84   Pulse 77   Ht 5\' 4"  (1.626 m)   Wt 158 lb (71.7 kg)   BMI 27.12 kg/m  Last Weight:  Wt Readings from Last 1 Encounters:  02/20/20 158 lb (71.7 kg)   MKL:KJZP mass index is 27.12 kg/m.     Last Height:   Ht Readings from Last 1 Encounters:  02/20/20 5\' 4"  (1.626 m)    Physical exam:  General: The patient is awake, alert and appears not in acute distress. The patient is well groomed. Head: Normocephalic, atraumatic. Neck is supple. Mallampati  3,  neck circumference:14. Nasal airflow congested -  Retrognathia is not seen.  Cardiovascular:  Regular rate and rhythm , without  murmurs or carotid bruit, and without distended neck veins. Respiratory: Lungs are clear to auscultation. Skin:  Without evidence of edema, or rash Trunk: BMI was 33 last visit 27 month ago - now 27 kg/m2- intended weight loss.  . The patient's posture is erect/ not stooped.  Neurologic exam : The patient is awake and alert, oriented to place and time.   Memory  MMSE: MMSE - Mini Mental State Exam 07/15/2016  Orientation to time 5  Orientation to Place 5  Registration 3  Attention/ Calculation 5  Recall 3  Recall-comments Initially stated she could not remember and remembered everything else she had been asked, but then came up with the three things to recall.  Language- name 2 objects 2  Language- repeat 1  Language- follow 3 step command 3  Language- read & follow direction 1  Write a sentence 1  Copy design 1  Total score 30    Attention span & concentration ability appears normal. She is giving me details about ur previous encounter and had questions about the diagnosis of carpal tunnel.  Speech is fluent,  without  dysarthria, dysphonia or aphasia.  Mood and  affect are appropriate.  Cranial nerves: Pupils are equal and briskly reactive to light. Funduscopic exam without evidence of pallor or edema, no cataracts. Extraocular movements  in vertical and horizontal planes intact and without nystagmus. Visual fields by finger perimetry are intact. Hearing to finger rub intact.  Facial sensation intact to fine touch. Facial motor strength is symmetric and tongue and uvula move midline.  Shoulder shrug was symmetrical.  Motor exam:  Normal tone, muscle bulk and symmetric strength in all extremities.  She has lesser grip strength in the left hand - she is right hand dominant.  There is atrophy of the thenar eminance, there is finger flexion weakness and normal extension.   Sensory:  Fine touch, pinprick and vibration were tested in all extremities. She reports numbness on the top of either thigh, somtimes burning , sometimes  Pin and needles.  Proprioception tested in the upper extremities was normal. Tingling numbness evident by Q- tip testing, also tingling at the left wrist in median nerve distribution.  The pinky finger is not involved. The right hand has no dysesthesias.   Coordination: Rapid alternating movements in the fingers/hands was normal. Finger-to-nose maneuver  normal without evidence of ataxia, dysmetria or tremor. Gait and station: Patient walks without assistive device and is able to rise from a seated position without assistance.  Strength within normal limits for age   Deep tendon reflexes: in the upper and lower extremities are symmetric and intact. Babinski maneuver deferred.    Assessment:  After physical and neurologic examination, review of laboratory studies,  Personal review of imaging studies, reports of other /same  Imaging studies, results of polysomnography and / or neurophysiology testing and pre-existing records as far as provided in visit., my assessment is   1) These bilateral median neuropathies are consistent with  bilateral carpal tunnel syndrome and the patient may feel better using a wrist brace preventing her from kinking the joint especially at night, if this has not helped in the past or has been currently already used the next step would be a hand surgery referral for surgical correction.  Please note that control of diabetes is essential in preventing polyneuropathy and mononeuropathies.  I reviewed her current medication.  The patient was advised of the nature of the diagnosed disorder , the treatment options and the  risks for general health and wellness arising from not treating the condition.  I spent more than 14 minutes of face to face time with the patient. We have discussed the diagnosis and differential and I answered the patient's questions.    Plan:  1) NCV and EMG  confirmed bilateral carpal tunnel syndrome , may be related to arthritic changes. I advised to use a soft wrist brace first and if this doesn't improve the pain, to see a handsurgeon for a release procedure.    RV prn wit NP.   Larey Seat, MD 07/35/4301, 4:84 PM  Certified in Neurology by ABPN Certified in [place] by [facility] [address], [place], [place] [address]

## 2020-02-21 ENCOUNTER — Other Ambulatory Visit: Payer: Self-pay | Admitting: Internal Medicine

## 2020-02-21 DIAGNOSIS — I10 Essential (primary) hypertension: Secondary | ICD-10-CM

## 2020-02-23 NOTE — Telephone Encounter (Signed)
LATIFAH from health and human service emailed me the paper work I filled out most of it and place it on your desk to finish so I can fax back.

## 2020-03-14 ENCOUNTER — Other Ambulatory Visit: Payer: Self-pay | Admitting: Internal Medicine

## 2020-03-18 ENCOUNTER — Other Ambulatory Visit: Payer: Self-pay | Admitting: Internal Medicine

## 2020-03-18 DIAGNOSIS — R600 Localized edema: Secondary | ICD-10-CM

## 2020-03-18 DIAGNOSIS — I10 Essential (primary) hypertension: Secondary | ICD-10-CM

## 2020-03-25 ENCOUNTER — Other Ambulatory Visit: Payer: Self-pay | Admitting: Internal Medicine

## 2020-03-25 NOTE — Telephone Encounter (Signed)
Left message on voicemail for patient to return call when available   

## 2020-03-25 NOTE — Telephone Encounter (Signed)
Let's call her family and find out if she moved.  She was supposed to be moving and a whole ton of paperwork was done numerous times.

## 2020-03-25 NOTE — Telephone Encounter (Signed)
It was noted that patient was moving to [place] at appointment in August 2021. I will send to Dr.Reed to confirm that we can deny rx and remove her as the current PCP

## 2020-03-27 ENCOUNTER — Telehealth: Payer: Self-pay

## 2020-03-27 NOTE — Telephone Encounter (Signed)
Roselyn Reef at [facility] [phone]) called about patient today. She stated patient was OOF with family x1 week and on return had a documented weight gain of 9.5 lbs. She does state patient has some swelling for which she is Lasix 40 mg qd.   Please advise if any recommendations.

## 2020-03-27 NOTE — Progress Notes (Signed)
This encounter was created in error - please disregard.

## 2020-03-27 NOTE — Telephone Encounter (Signed)
I recommend an acute visit for evaluation with the NPs at the office.

## 2020-03-27 NOTE — Telephone Encounter (Signed)
Tried to contact [facility] and after being transferred and holding for 15 minutes I hung up and called back and left message for Roselyn Reef to return call.

## 2020-03-28 ENCOUNTER — Telehealth: Payer: Self-pay

## 2020-03-28 ENCOUNTER — Ambulatory Visit: Payer: Self-pay | Admitting: Family

## 2020-03-28 MED ORDER — QUETIAPINE FUMARATE 100 MG PO TABS: ORAL_TABLET | ORAL | 0 refills | Status: DC

## 2020-03-28 NOTE — Telephone Encounter (Signed)
Noted, I was trying to confirm that patient did or did not need to schedule a routine follow-up with Mariea Clonts, Tiffany L, DO.  I am aware that patient is being seen acutely today.

## 2020-03-28 NOTE — Telephone Encounter (Signed)
There was a separate message going on with concerns about weight gain of fluid over the holiday and an appt was made for this afternoon to evaluate her.

## 2020-03-28 NOTE — Telephone Encounter (Signed)
I did not hear back from the facility and called again today.  Roselyn Reef is off but I talked to [place] and scheduled an appt with Dinah this afternoon.

## 2020-03-28 NOTE — Telephone Encounter (Signed)
Patients daughter called stating her mother is still in [place] and will remain here until further notice. Patient should be considered our patient until notified by daughter that she has moved to [place]. [name] requested that I approve RX that was sent to [place] earlier this week for a 90 day supply. Rx was sent, pharmacy confirmed.     [name] states this process is taking longer than they imagined. I informed [name] that I will check with Dr.Reed to see when her mother will need to follow-up since she is still considered our patient.   Dr.Reed please advise

## 2020-04-01 ENCOUNTER — Other Ambulatory Visit: Payer: Self-pay | Admitting: Family

## 2020-04-01 DIAGNOSIS — R600 Localized edema: Secondary | ICD-10-CM

## 2020-04-01 DIAGNOSIS — I10 Essential (primary) hypertension: Secondary | ICD-10-CM

## 2020-04-07 ENCOUNTER — Encounter (HOSPITAL_COMMUNITY): Payer: Self-pay

## 2020-04-07 ENCOUNTER — Emergency Department (HOSPITAL_COMMUNITY): Payer: Medicare Other

## 2020-04-07 ENCOUNTER — Other Ambulatory Visit: Payer: Self-pay

## 2020-04-07 ENCOUNTER — Observation Stay (HOSPITAL_COMMUNITY)
Admission: EM | Admit: 2020-04-07 | Discharge: 2020-04-08 | Disposition: A | Discharge status: Home / Self Care | Payer: Medicare Other | Attending: Internal Medicine | Admitting: Internal Medicine

## 2020-04-07 DIAGNOSIS — K5900 Constipation, unspecified: Secondary | ICD-10-CM | POA: Insufficient documentation

## 2020-04-07 DIAGNOSIS — I10 Essential (primary) hypertension: Secondary | ICD-10-CM | POA: Diagnosis present

## 2020-04-07 DIAGNOSIS — E782 Mixed hyperlipidemia: Secondary | ICD-10-CM | POA: Diagnosis present

## 2020-04-07 DIAGNOSIS — R270 Ataxia, unspecified: Secondary | ICD-10-CM | POA: Diagnosis not present

## 2020-04-07 DIAGNOSIS — R531 Weakness: Secondary | ICD-10-CM | POA: Diagnosis not present

## 2020-04-07 DIAGNOSIS — R1110 Vomiting, unspecified: Secondary | ICD-10-CM | POA: Diagnosis not present

## 2020-04-07 DIAGNOSIS — F0151 Vascular dementia with behavioral disturbance: Secondary | ICD-10-CM | POA: Diagnosis not present

## 2020-04-07 DIAGNOSIS — Z7984 Long term (current) use of oral hypoglycemic drugs: Secondary | ICD-10-CM | POA: Diagnosis not present

## 2020-04-07 DIAGNOSIS — G9389 Other specified disorders of brain: Secondary | ICD-10-CM | POA: Diagnosis not present

## 2020-04-07 DIAGNOSIS — Z79899 Other long term (current) drug therapy: Secondary | ICD-10-CM | POA: Diagnosis not present

## 2020-04-07 DIAGNOSIS — E039 Hypothyroidism, unspecified: Secondary | ICD-10-CM | POA: Diagnosis not present

## 2020-04-07 DIAGNOSIS — I6389 Other cerebral infarction: Secondary | ICD-10-CM | POA: Diagnosis not present

## 2020-04-07 DIAGNOSIS — I491 Atrial premature depolarization: Secondary | ICD-10-CM | POA: Diagnosis not present

## 2020-04-07 DIAGNOSIS — Z87891 Personal history of nicotine dependence: Secondary | ICD-10-CM | POA: Diagnosis not present

## 2020-04-07 DIAGNOSIS — Z20822 Contact with and (suspected) exposure to covid-19: Secondary | ICD-10-CM | POA: Insufficient documentation

## 2020-04-07 DIAGNOSIS — R110 Nausea: Secondary | ICD-10-CM | POA: Diagnosis not present

## 2020-04-07 DIAGNOSIS — R9431 Abnormal electrocardiogram [ECG] [EKG]: Secondary | ICD-10-CM | POA: Diagnosis not present

## 2020-04-07 DIAGNOSIS — E119 Type 2 diabetes mellitus without complications: Secondary | ICD-10-CM | POA: Insufficient documentation

## 2020-04-07 DIAGNOSIS — F01518 Vascular dementia, unspecified severity, with other behavioral disturbance: Secondary | ICD-10-CM | POA: Diagnosis present

## 2020-04-07 DIAGNOSIS — R5381 Other malaise: Secondary | ICD-10-CM | POA: Diagnosis not present

## 2020-04-07 DIAGNOSIS — E1165 Type 2 diabetes mellitus with hyperglycemia: Secondary | ICD-10-CM | POA: Diagnosis not present

## 2020-04-07 DIAGNOSIS — IMO0002 Reserved for concepts with insufficient information to code with codable children: Secondary | ICD-10-CM | POA: Diagnosis present

## 2020-04-07 DIAGNOSIS — R059 Cough, unspecified: Secondary | ICD-10-CM | POA: Diagnosis not present

## 2020-04-07 LAB — CBC WITH DIFFERENTIAL/PLATELET
Abs Immature Granulocytes: 0 10*3/uL (ref 0.00–0.07)
Basophils Absolute: 0 10*3/uL (ref 0.0–0.1)
Basophils Relative: 1 %
Eosinophils Absolute: 0.2 10*3/uL (ref 0.0–0.5)
Eosinophils Relative: 4 %
HCT: 36 % (ref 36.0–46.0)
Hemoglobin: 12.2 g/dL (ref 12.0–15.0)
Immature Granulocytes: 0 %
Lymphocytes Relative: 39 %
Lymphs Abs: 1.7 10*3/uL (ref 0.7–4.0)
MCH: 30.9 pg (ref 26.0–34.0)
MCHC: 33.9 g/dL (ref 30.0–36.0)
MCV: 91.1 fL (ref 80.0–100.0)
Monocytes Absolute: 0.3 10*3/uL (ref 0.1–1.0)
Monocytes Relative: 8 %
Neutro Abs: 2.1 10*3/uL (ref 1.7–7.7)
Neutrophils Relative %: 48 %
Platelets: 185 10*3/uL (ref 150–400)
RBC: 3.95 MIL/uL (ref 3.87–5.11)
RDW: 12.8 % (ref 11.5–15.5)
WBC: 4.3 10*3/uL (ref 4.0–10.5)
nRBC: 0 % (ref 0.0–0.2)

## 2020-04-07 LAB — URINALYSIS, ROUTINE W REFLEX MICROSCOPIC
Bilirubin Urine: NEGATIVE
Glucose, UA: NEGATIVE mg/dL
Hgb urine dipstick: NEGATIVE
Ketones, ur: NEGATIVE mg/dL
Leukocytes,Ua: NEGATIVE
Nitrite: NEGATIVE
Protein, ur: NEGATIVE mg/dL
Specific Gravity, Urine: >1.046 — ABNORMAL HIGH (ref 1.005–1.030)
pH: 7 (ref 5.0–8.0)

## 2020-04-07 LAB — COMPREHENSIVE METABOLIC PANEL
ALT: 24 U/L (ref 0–44)
AST: 26 U/L (ref 15–41)
Albumin: 3.8 g/dL (ref 3.5–5.0)
Alkaline Phosphatase: 74 U/L (ref 38–126)
Anion gap: 10 (ref 5–15)
BUN: 13 mg/dL (ref 8–23)
CO2: 25 mmol/L (ref 22–32)
Calcium: 9.6 mg/dL (ref 8.9–10.3)
Chloride: 105 mmol/L (ref 98–111)
Creatinine, Ser: 0.71 mg/dL (ref 0.44–1.00)
GFR, Estimated: >60 mL/min (ref >60)
Glucose, Bld: 131 mg/dL — ABNORMAL HIGH (ref 70–99)
Potassium: 3.7 mmol/L (ref 3.5–5.1)
Sodium: 140 mmol/L (ref 135–145)
Total Bilirubin: 0.7 mg/dL (ref 0.3–1.2)
Total Protein: 6.7 g/dL (ref 6.5–8.1)

## 2020-04-07 LAB — CBG MONITORING, ED: Glucose-Capillary: 131 mg/dL — ABNORMAL HIGH (ref 70–99)

## 2020-04-07 LAB — TSH: TSH: 0.283 u[IU]/mL — ABNORMAL LOW (ref 0.350–4.500)

## 2020-04-07 LAB — RESP PANEL BY RT-PCR (FLU A&B, COVID) ARPGX2
Influenza A by PCR: NEGATIVE
Influenza B by PCR: NEGATIVE
SARS Coronavirus 2 by RT PCR: NEGATIVE

## 2020-04-07 LAB — LIPASE, BLOOD: Lipase: 30 U/L (ref 11–51)

## 2020-04-07 MED ORDER — QUETIAPINE FUMARATE 100 MG PO TABS: 100.0000 mg | ORAL_TABLET | Freq: Every day | ORAL | Status: DC

## 2020-04-07 MED ORDER — AMLODIPINE BESYLATE 5 MG PO TABS: 5.0000 mg | ORAL_TABLET | Freq: Every day | ORAL | Status: DC

## 2020-04-07 MED ORDER — INSULIN ASPART 100 UNIT/ML ~~LOC~~ SOLN
0.0000 [IU] | Freq: Three times a day (TID) | SUBCUTANEOUS | Status: DC
  Filled 2020-04-07: qty 0.09

## 2020-04-07 MED ORDER — LEVOTHYROXINE SODIUM 100 MCG PO TABS
100.0000 ug | ORAL_TABLET | Freq: Every day | ORAL | Status: DC
  Administered 2020-04-08: 05:00:00 100 ug via ORAL
  Filled 2020-04-07: qty 1

## 2020-04-07 MED ORDER — DONEPEZIL HCL 5 MG PO TABS: 5.0000 mg | ORAL_TABLET | Freq: Every day | ORAL | Status: DC

## 2020-04-07 MED ORDER — TRAZODONE HCL 100 MG PO TABS
100.0000 mg | ORAL_TABLET | Freq: Every evening | ORAL | Status: DC | PRN
  Administered 2020-04-08: 03:00:00 100 mg via ORAL
  Filled 2020-04-07: qty 1

## 2020-04-07 MED ORDER — LORATADINE 10 MG PO TABS: 10.0000 mg | ORAL_TABLET | Freq: Every day | ORAL | Status: DC

## 2020-04-07 MED ORDER — PRAVASTATIN SODIUM 20 MG PO TABS: 20.0000 mg | ORAL_TABLET | Freq: Every day | ORAL | Status: DC

## 2020-04-07 MED ORDER — HYDROCODONE-ACETAMINOPHEN 10-325 MG PO TABS
1.0000 | ORAL_TABLET | ORAL | Status: DC
Start: 2020-04-08 — End: 2020-04-08

## 2020-04-07 MED ORDER — IOHEXOL 300 MG/ML  SOLN
100.0000 mL | Freq: Once | INTRAMUSCULAR | Status: AC | PRN
  Administered 2020-04-07: 18:00:00 100 mL via INTRAVENOUS

## 2020-04-07 MED ORDER — OMEGA-3-ACID ETHYL ESTERS 1 G PO CAPS
1.0000 g | ORAL_CAPSULE | Freq: Every day | ORAL | Status: DC
  Filled 2020-04-07: qty 1

## 2020-04-07 MED ORDER — ENOXAPARIN SODIUM 40 MG/0.4ML ~~LOC~~ SOLN: 40.0000 mg | SUBCUTANEOUS | Status: DC

## 2020-04-07 MED ORDER — DONEPEZIL HCL 5 MG PO TABS
5.0000 mg | ORAL_TABLET | Freq: Once | ORAL | Status: AC
  Administered 2020-04-07: 23:00:00 5 mg via ORAL
  Filled 2020-04-07: qty 1

## 2020-04-07 MED ORDER — QUETIAPINE FUMARATE 100 MG PO TABS
100.0000 mg | ORAL_TABLET | Freq: Once | ORAL | Status: AC
  Administered 2020-04-07: 23:00:00 100 mg via ORAL
  Filled 2020-04-07: qty 1

## 2020-04-07 MED ORDER — HYDROCODONE-ACETAMINOPHEN 10-325 MG PO TABS
1.0000 | ORAL_TABLET | Freq: Once | ORAL | Status: AC
  Administered 2020-04-07: 23:00:00 1 via ORAL
  Filled 2020-04-07: qty 1

## 2020-04-07 NOTE — ED Provider Notes (Signed)
[facility] DEPT Provider Note   CSN: [id number] Arrival date & time: 04/07/20  1611     History Chief Complaint  Patient presents with  . Weakness    Joy Ward is a 79 y.o. female.  HPI 79 year old female with a history of DM type II hyperlipidemia, hypertension, hypothyroidism, stroke, lower GI bleed presents to the ER with complaints of weakness, nausea and overall not feeling well over the last 3 days.  Cannot remember when her last bowel movement was.  Denies any diarrhea.  Denies any vomiting.  States she has had poor po intake over the last few days. Denies any fevers.  Denies any dysuria.  Denies any word slurring, unilateral weakness.  Patient is alert and oriented.  She also endorses a productive cough with green phlegm for the last few days.  Fully vaccinated for Covid with booster.    Past Medical History:  Diagnosis Date  . Allergy   . Arthritis   . Chronic back pain   . DDD (degenerative disc disease), lumbar   . Diabetes mellitus without complication ([place])   . Diabetes type 2, uncontrolled ([place]) 09/18/2006   Annotation: Noninsulin dependent Qualifier: Diagnosis of  By: Amil Amen MD, Benjamine Mola    . Former tobacco use   . Hyperlipidemia    a. H/o muscle aches with statins per PCP notes.  . Hypertension   . Hypothyroidism    a. prior thyroid removal.  . Memory loss or impairment 03/29/2019  . Stroke [facility])    a. Listed in PCP notes: "History of stroke:  History of terrible headache.  Had a CT scan of brain and found "ministroke"  Was removed from ERT subsequently. "    Patient Active Problem List   Diagnosis Date Noted  . Carpal tunnel syndrome of left wrist 02/20/2020  . Diabetic polyneuropathy associated with type 2 diabetes mellitus ([place]) 02/20/2020  . Lower GI bleed 07/31/2019  . Syncope 07/30/2019  . Pancytopenia ([place]) 07/30/2019  . Hyperchloremia 07/30/2019  . Metabolic acidosis 77/93/9030  . Hypocalcemia 07/30/2019   . Palliative care patient 07/03/2019  . Hallucinations 06/24/2019  . Metabolic encephalopathy 01/17/3006  . Altered mental status   . Recurrent UTI 04/30/2019  . Vascular dementia with behavior disturbance ([place]) 04/14/2019  . Primary insomnia 04/14/2019  . Hypothyroidism 04/14/2019  . Diabetes mellitus ([place]) 04/14/2019  . Memory loss or impairment 03/29/2019  . Diabetic peripheral neuropathy associated with type 2 diabetes mellitus ([place]) 12/29/2017  . Peripheral edema 12/29/2017  . Elevated liver enzymes 10/15/2016  . Chest pain 04/22/2016  . Systolic murmur 62/26/3335  . Hypercalcemia 04/22/2016  . Hyperlipidemia 04/22/2016  . Baker's cyst of knee, right 04/21/2016  . HYPOKALEMIA 01/24/2010  . CONSTIPATION 12/23/2009  . INTERMITTENT VERTIGO 12/23/2009  . SINUSITIS, ACUTE 04/25/2009  . VAGINITIS, CANDIDAL 02/14/2009  . HIP PAIN, RIGHT 02/14/2009  . HYPERLIPIDEMIA, MIXED 01/01/2009  . BACK PAIN, LUMBAR, WITH RADICULOPATHY 12/25/2008  . KNEE PAIN, BILATERAL 12/18/2008  . TROCHANTERIC BURSITIS, BILATERAL 12/18/2008  . Disturbance in sleep behavior 12/18/2008  . ONYCHOMYCOSIS, TOENAILS 10/30/2008  . ENTERITIS, CLOSTRIDIUM DIFFICILE 10/15/2006  . Diabetes type 2, uncontrolled ([place]) 09/18/2006  . Essential hypertension 09/18/2006  . Allergic rhinitis 09/18/2006  . GOITER, MULTINODULAR 06/29/2006  . STROKE 04/27/2001    Past Surgical History:  Procedure Laterality Date  . ABDOMINAL HYSTERECTOMY  1994   TAH, not clear if unilateral oophorectomy as well.  [facility]  . CHOLECYSTECTOMY  1973  open  . DECOMPRESSIVE LUMBAR LAMINECTOMY LEVEL 4  2005   In [place]  . THYROIDECTOMY  2012   multinodular goiter     OB History   No obstetric history on file.     Family History  Problem Relation Age of Onset  . Heart disease Mother        unclear details "atherosclerosis"  . Diabetes Mother   . Alcohol abuse Sister   . Diabetes Brother   .  Diabetes Brother   . Drug abuse Brother   . Diabetes Daughter   . Heart disease Son        CHF  . Alcohol abuse Son     Social History   Tobacco Use  . Smoking status: Former Research scientist (life sciences)  . Smokeless tobacco: Never Used  . Tobacco comment: No smoking since 79 yo - smoked 10 yrs  Vaping Use  . Vaping Use: Never used  Substance Use Topics  . Alcohol use: No  . Drug use: No    Home Medications Prior to Admission medications   Medication Sig Start Date End Date Taking? Authorizing Provider  acetaminophen (TYLENOL) 500 MG tablet Take 500 mg by mouth every 6 (six) hours as needed (fever 99.5-101, minor headache, minor discomfort).    [provider]  amLODipine (NORVASC) 5 MG tablet TAKE 1 TABLET BY MOUTH 1 time DAILY 10/24/19   Reed, Tiffany L, DO  bisacodyl (DULCOLAX) 10 MG suppository Place 1 suppository (10 mg total) rectally daily as needed for moderate constipation. 06/28/19   Pokhrel, Corrie Mckusick, MD  diclofenac Sodium (VOLTAREN) 1 % GEL Apply 4 g topically daily as needed. 01/22/20   Reed, Tiffany L, DO  donepezil (ARICEPT) 5 MG tablet TAKE 1 TABLET BY MOUTH AT BEDTIME 10/24/19   Reed, Tiffany L, DO  furosemide (LASIX) 20 MG tablet TAKE 2 TABLETS BY MOUTH EVERY DAY 03/18/20   Reed, Tiffany L, DO  HYDROcodone-acetaminophen (NORCO) 10-325 MG tablet Take 1 tablet by mouth every 6 (six) hours as needed for moderate pain.     [provider]  levothyroxine (SYNTHROID) 100 MCG tablet TAKE 1 TABLET BY MOUTH EVERY DAY 03/14/20   Reed, Tiffany L, DO  loperamide (IMODIUM) 2 MG capsule Take 2 mg by mouth as needed for diarrhea or loose stools (do not exceed 8 dsoses in 24 hours).    [provider]  loratadine (CLARITIN) 10 MG tablet Take 1 tablet (10 mg total) by mouth daily. 10/20/19   Ngetich, Dinah C, NP  magnesium hydroxide (MILK OF MAGNESIA) 400 MG/5ML suspension Take 30 mLs by mouth at bedtime as needed (constipation). 08/01/19   Matcha, Beverely Pace, MD  metFORMIN (GLUCOPHAGE)  500 MG tablet Take 1 tablet (500 mg total) by mouth daily. 10/24/19   Reed, Tiffany L, DO  metoprolol tartrate (LOPRESSOR) 25 MG tablet Take 12.5 mg by mouth 2 (two) times daily.    [provider]  Neomycin-Bacitracin-Polymyxin (TRIPLE ANTIBIOTIC) [phone] OINT Apply topically as needed.    [provider]  Omega-3 Fatty Acids (FISH OIL) 1000 MG CAPS Take 1,000 mg by mouth daily.     [provider]  polyethylene glycol (MIRALAX / GLYCOLAX) 17 g packet Take 17 g by mouth daily. 09/06/19   Ngetich, Dinah C, NP  potassium chloride SA (KLOR-CON) 20 MEQ tablet TAKE 1 TABLET BY MOUTH EVERY DAY 02/21/20   Reed, Tiffany L, DO  pravastatin (PRAVACHOL) 20 MG tablet TAKE 1 TABLET BY MOUTH EVERY DAY AT 6 PM. 12/26/19  Reed, Tiffany L, DO  QUEtiapine (SEROQUEL) 100 MG tablet TAKE 1 TABLET BY MOUTH NIGHTLY AT BEDTIME 03/28/20   Reed, Tiffany L, DO  traZODone (DESYREL) 100 MG tablet TAKE 1 TABLET BY MOUTH AT BEDTIME AS NEEDED FOR SLEEP 02/21/20   Reed, Tiffany L, DO    Allergies    Clindamycin hcl, Sulfamethoxazole-trimethoprim, Zanaflex [tizanidine hcl], Metronidazole, Statins, Bee venom, Ace inhibitors, and Adhesive [tape]  Review of Systems   Review of Systems  Constitutional: Negative for chills and fever.  HENT: Negative for ear pain and sore throat.   Eyes: Negative for pain and visual disturbance.  Respiratory: Negative for cough and shortness of breath.   Cardiovascular: Negative for chest pain and palpitations.  Gastrointestinal: Negative for abdominal pain and vomiting.  Genitourinary: Negative for dysuria and hematuria.  Musculoskeletal: Negative for arthralgias and back pain.  Skin: Negative for color change and rash.  Neurological: Negative for seizures and syncope.  All other systems reviewed and are negative.   Physical Exam Updated Vital Signs BP 130/79   Pulse 76   Temp 98.7 F (37.1 C) (Rectal)   Resp 20   SpO2 99%   Physical Exam Vitals and  nursing note reviewed.  Constitutional:      General: She is not in acute distress.    Appearance: She is well-developed and well-nourished.  HENT:     Head: Normocephalic and atraumatic.  Eyes:     Conjunctiva/sclera: Conjunctivae normal.  Cardiovascular:     Rate and Rhythm: Normal rate and regular rhythm.     Heart sounds: No murmur heard.   Pulmonary:     Effort: Pulmonary effort is normal. No respiratory distress.     Breath sounds: Normal breath sounds.  Abdominal:     Palpations: Abdomen is soft.     Tenderness: There is no abdominal tenderness. There is no right CVA tenderness or left CVA tenderness.  Musculoskeletal:        General: No edema. Normal range of motion.     Cervical back: Neck supple.     Right lower leg: No edema.     Left lower leg: No edema.  Skin:    General: Skin is warm and dry.  Neurological:     General: No focal deficit present.     Mental Status: She is alert and oriented to person, place, and time.     Sensory: No sensory deficit.     Motor: No weakness.     Comments: Mental Status:  Alert, thought content appropriate, able to give a coherent history. Speech fluent without evidence of aphasia. Able to follow 2 step commands without difficulty.  Cranial Nerves:  II: Peripheral visual fields grossly normal, pupils equal, round, reactive to light III,IV, VI: ptosis not present, extra-ocular motions intact bilaterally  V,VII: smile symmetric, facial light touch sensation equal VIII: hearing grossly normal to voice  X: uvula elevates symmetrically  XI: bilateral shoulder shrug symmetric and strong XII: midline tongue extension without fassiculations Motor:  Normal tone. 5/5 strength of BUE and BLE major muscle groups including strong and equal grip strength and dorsiflexion/plantar flexion Sensory: light touch normal in all extremities. Cerebellar: normal finger-to-nose with bilateral upper extremities, Romberg sign absent Gait: not accessed    Psychiatric:        Mood and Affect: Mood and affect normal.     ED Results / Procedures / Treatments   Labs (all labs ordered are listed, but only abnormal results are displayed) Labs Reviewed  COMPREHENSIVE METABOLIC PANEL - Abnormal; Notable for the following components:      Result Value   Glucose, Bld 131 (*)    All other components within normal limits  CBG MONITORING, ED - Abnormal; Notable for the following components:   Glucose-Capillary 131 (*)    All other components within normal limits  RESP PANEL BY RT-PCR (FLU A&B, COVID) [id number]  CBC WITH DIFFERENTIAL/PLATELET  LIPASE, BLOOD  URINALYSIS, ROUTINE W REFLEX MICROSCOPIC  TSH    EKG EKG Interpretation  Date/Time:  Sunday April 07 2020 16:54:05 EST Ventricular Rate:  74 PR Interval:    QRS Duration: 92 QT Interval:  399 QTC Calculation: 443 R Axis:   60 Text Interpretation: Sinus rhythm Prolonged PR interval Abnormal R-wave progression, early transition Borderline T wave abnormalities Confirmed by Ray, Danielle ([id number]) on 04/07/2020 5:01:33 PM   Radiology DG Chest Portable 1 View  Result Date: 04/07/2020 CLINICAL DATA:  Cough EXAM: PORTABLE CHEST 1 VIEW COMPARISON:  08/06/2019 FINDINGS: The heart size and mediastinal contours are within normal limits. Both lungs are clear. The visualized skeletal structures are unremarkable. IMPRESSION: No acute abnormality of the lungs in AP portable projection. Electronically Signed   By: Alex  Bibbey M.D.   On: 04/07/2020 17:28    Procedures Procedures (including critical care time)  Medications Ordered in ED Medications - No data to display  ED Course  I have reviewed the triage vital signs and the nursing notes.  Pertinent labs & imaging results that were available during my care of the patient were reviewed by me and considered in my medical decision making (see chart for details).    MDM Rules/Calculators/A&P                          79  year old female  presents with weakness and nausea.  Overall, alert, oriented, nontoxic-appearing, no acute distress, resting comfortably in the ER bed.  Vitals on arrival overall reassuring, afebrile not hypoxic tachypneic or tachycardic.  Mild RLQ tenderness.   No flank tenderness.  Clear lung sounds. Weak appearing. Rectal temp pending.   Labs reviewed and interpreted by me:  CBC without leukocytosis, normal hemoglobin CMP without any electrode abnormalities, normal creatinine and liver function test. Lipase normal Covid and flu pending Glucose of 131 TSH pending UA pending  Chest x-ray reviewed and interpreted by me, overall with signs of infection  EKG reviewed by myself and my supervising physician, sinus rhythm with prolonged with T wave abnormalities and prolonged PR interval.   MDM:  PT was also seen and evaluated by my supervising physician Dr. Jeanell Sparrow. Plan to CT abdomen, check COVID, UA. Signed out care to [facility], suspect the patient may need admission for weakness.   Final Clinical Impression(s) / ED Diagnoses Final diagnoses:  None    Rx / DC Orders ED Discharge Orders    None        Lyndel Safe 04/07/20 1738    Pattricia Boss, MD 04/07/20 2239

## 2020-04-07 NOTE — ED Provider Notes (Signed)
  Physical Exam  BP (!) 138/92   Pulse 79   Temp 98.7 F (37.1 C) (Rectal)   Resp 16   SpO2 98%   Physical Exam Vitals and nursing note reviewed.  Constitutional:      General: She is not in acute distress.    Appearance: She is well-developed and well-nourished.     Comments: Appears nontoxic  HENT:     Head: Normocephalic and atraumatic.  Eyes:     Extraocular Movements: Extraocular movements intact and EOM normal.     Conjunctiva/sclera: Conjunctivae normal.     Pupils: Pupils are equal, round, and reactive to light.  Cardiovascular:     Rate and Rhythm: Normal rate and regular rhythm.     Pulses: Normal pulses and intact distal pulses.  Pulmonary:     Effort: Pulmonary effort is normal. No respiratory distress.     Breath sounds: Normal breath sounds. No wheezing.  Abdominal:     General: There is no distension.     Palpations: Abdomen is soft. There is no mass.     Tenderness: There is no abdominal tenderness. There is no guarding or rebound.  Musculoskeletal:        General: Normal range of motion.     Cervical back: Normal range of motion and neck supple.  Skin:    General: Skin is warm and dry.     Capillary Refill: Capillary refill takes less than 2 seconds.  Neurological:     Mental Status: She is alert and oriented to person, place, and time.     Gait: Gait abnormal.     Comments: Ataxic gait. Requiring assistance to walk due to weakness and abnormal gait.   Psychiatric:        Mood and Affect: Mood and affect normal.     ED Course/Procedures     Procedures  MDM   Patient signed out to me by Jamey Ripa, PA-C.  Please see previous notes for further history.   In brief, patient presented for evaluation of 2 days of nausea and weakness.  No clear infectious symptoms such as fever or pain.  Unknown when last bowel movement was.  Initial work-up including labs overall reassuring.  She is pending urine, Covid test, and CT of the abdomen.  Plan to reassess  afterwards to determine dispo.  Covid and flu negative.  Urine without signs of infection.  CT abdomen pelvis shows mild constipation, but no acute infectious cause for symptoms.  Discussed with patient.  Patient states she lives at a assisted living facility, is normally ambulatory without assistance.  I personally ambulated patient in the room, she had an ataxic gait and required assistance to prevent her from falling.  Per patient, this is not consistent with her baseline.  I am concerned about her high risk for falling and (?)new ataxic gait.  Will add on head CT, patient will likely need to be admitted for further evaluation including MRI and PT evaluation.  Additionally, when patient stood up and walk, she became very nauseous, burping multiple times.  CT head negative for acute findings. Will call for admission.   Discussed with Dr. Hal Hope from [facility] hospitalist service, pt to be admitted. Requesting MRI, ordered placed.        Franchot Heidelberg, PA-C 04/07/20 2136    Pattricia Boss, MD 04/07/20 2239

## 2020-04-07 NOTE — H&P (Addendum)
History and Physical    Joy Ward JKK:[id number] DOB: 1941-03-19 DOA: 04/07/2020  PCP: Gayland Curry, DO  Patient coming from: [facility].  History obtained from ER physician and patient's daughter.  Chief Complaint: Not eating well.  HPI: Joy Ward is a 79 y.o. female with history of dementia, diabetes mellitus, hypertension, hypothyroidism was not eating well for last couple of days and found it difficult to ambulate and was brought to the ER.  As per the report patient did not have any nausea vomiting diarrhea or loss of consciousness.  ED Course: In the ER patient appeared nonfocal CT head was unremarkable and CT abdomen pelvis did not show any acute.  Labs unremarkable UA was unremarkable.  EKG showed normal sinus rhythm.  Following trying to ambulate the patient patient was finding it difficult and had to have assistance in trying to make walk.  This was new for the patient an MRI brain was ordered which is pending.  Covid test was negative.  Blood pressure was in the low normal at presentation.  Review of Systems: As per HPI, rest all negative.   Past Medical History:  Diagnosis Date  . Allergy   . Arthritis   . Chronic back pain   . DDD (degenerative disc disease), lumbar   . Diabetes mellitus without complication ([place])   . Diabetes type 2, uncontrolled ([place]) 09/18/2006   Annotation: Noninsulin dependent Qualifier: Diagnosis of  By: Amil Amen MD, Benjamine Mola    . Former tobacco use   . Hyperlipidemia    a. H/o muscle aches with statins per PCP notes.  . Hypertension   . Hypothyroidism    a. prior thyroid removal.  . Memory loss or impairment 03/29/2019  . Stroke [facility])    a. Listed in PCP notes: "History of stroke:  History of terrible headache.  Had a CT scan of brain and found "ministroke"  Was removed from ERT subsequently. "    Past Surgical History:  Procedure Laterality Date  . ABDOMINAL HYSTERECTOMY  1994   TAH, not clear if unilateral oophorectomy  as well.  [facility] [place]  . CHOLECYSTECTOMY  1973   open  . DECOMPRESSIVE LUMBAR LAMINECTOMY LEVEL 4  2005   In [place]  . THYROIDECTOMY  2012   multinodular goiter     reports that she has quit smoking. She has never used smokeless tobacco. She reports that she does not drink alcohol and does not use drugs.  Allergies  Allergen Reactions  . Clindamycin Hcl Other (See Comments)    REACTION: C. difficile colitis  . Sulfamethoxazole-Trimethoprim Other (See Comments)     Eye redness  . Zanaflex [Tizanidine Hcl] Anaphylaxis  . Metronidazole Hives  . Statins Other (See Comments)    Side Effect: muscle pain Has tried Zocor, Crestor--she does not want to take a statin, period  . Bee Venom Other (See Comments)    Unknown reaction  . Ace Inhibitors Cough  . Adhesive [Tape] Itching, Rash and Other (See Comments)    Blistering of skin     Family History  Problem Relation Age of Onset  . Heart disease Mother        unclear details "atherosclerosis"  . Diabetes Mother   . Alcohol abuse Sister   . Diabetes Brother   . Diabetes Brother   . Drug abuse Brother   . Diabetes Daughter   . Heart disease Son        CHF  . Alcohol abuse  Son     Prior to Admission medications   Medication Sig Start Date End Date Taking? Authorizing Provider  acetaminophen (TYLENOL) 325 MG tablet Take 650 mg by mouth every 6 (six) hours as needed for mild pain (fever greater than or equal to 101 degrees).   Yes [provider]  aluminum-magnesium hydroxide-simethicone (MAALOX) 200-200-20 MG/5ML SUSP Take 30 mLs by mouth every 6 (six) hours as needed (heartburn/indigestion).   Yes [provider]  amLODipine (NORVASC) 5 MG tablet TAKE 1 TABLET BY MOUTH 1 time DAILY Patient taking differently: Take 5 mg by mouth daily. 10/24/19  Yes Reed, Tiffany L, DO  bisacodyl (DULCOLAX) 10 MG suppository Place 1 suppository (10 mg total) rectally daily as needed for moderate  constipation. 06/28/19  Yes Pokhrel, Laxman, MD  Calcium-Vitamin D-Vitamin K (VIACTIV CALCIUM PLUS D) 650-12.5-40 MG-MCG-MCG CHEW Chew 1 tablet by mouth daily.   Yes [provider]  diclofenac Sodium (VOLTAREN) 1 % GEL Apply 4 g topically daily as needed. Patient taking differently: Apply 4 g topically See admin instructions. Apply  4 grams topically to lower back/ left hand and fingers three times daily, may also use daily as needed for hand and finger pain 01/22/20  Yes Reed, Tiffany L, DO  donepezil (ARICEPT) 5 MG tablet TAKE 1 TABLET BY MOUTH AT BEDTIME Patient taking differently: Take 5 mg by mouth at bedtime. 10/24/19  Yes Reed, Tiffany L, DO  furosemide (LASIX) 20 MG tablet TAKE 2 TABLETS BY MOUTH EVERY DAY Patient taking differently: Take 40 mg by mouth daily. 03/18/20  Yes Reed, Tiffany L, DO  guaifenesin (ROBITUSSIN) 100 MG/5ML syrup Take 200 mg by mouth every 6 (six) hours as needed for cough.   Yes [provider]  HYDROcodone-acetaminophen (NORCO) 10-325 MG tablet Take 1 tablet by mouth See admin instructions. Take one tablet by mouth daily at 9am, 1pm, 5pm and 9pm   Yes [provider]  levothyroxine (SYNTHROID) 100 MCG tablet TAKE 1 TABLET BY MOUTH EVERY DAY Patient taking differently: Take 100 mcg by mouth daily before breakfast. 03/14/20  Yes Reed, Tiffany L, DO  loperamide (IMODIUM) 2 MG capsule Take 2 mg by mouth as needed for diarrhea or loose stools (do not exceed 8 doses in 24 hours).   Yes [provider]  loratadine (CLARITIN) 10 MG tablet Take 1 tablet (10 mg total) by mouth daily. 10/20/19  Yes Ngetich, Dinah C, NP  magnesium hydroxide (MILK OF MAGNESIA) 400 MG/5ML suspension Take 30 mLs by mouth at bedtime as needed (constipation). 08/01/19  Yes Matcha, Anupama, MD  metFORMIN (GLUCOPHAGE) 500 MG tablet Take 1 tablet (500 mg total) by mouth daily. 10/24/19  Yes Reed, Tiffany L, DO  Neomycin-Bacitracin-Polymyxin (TRIPLE ANTIBIOTIC) [phone]  OINT Apply 1 application topically as needed (minor skin tears/abrasions).   Yes [provider]  Omega-3 Fatty Acids (FISH OIL) 1000 MG CAPS Take 1,000 mg by mouth daily.    Yes [provider]  polyethylene glycol (MIRALAX / GLYCOLAX) 17 g packet Take 17 g by mouth daily. Patient taking differently: Take 17 g by mouth daily as needed. 09/06/19  Yes Ngetich, Dinah C, NP  potassium chloride SA (KLOR-CON) 20 MEQ tablet TAKE 1 TABLET BY MOUTH EVERY DAY Patient taking differently: Take 20 mEq by mouth daily. 02/21/20  Yes Reed, Tiffany L, DO  pravastatin (PRAVACHOL) 20 MG tablet TAKE 1 TABLET BY MOUTH EVERY DAY AT 6 PM. Patient taking differently: Take 20 mg by mouth daily at 6 PM. 12/26/19  Yes Reed, Tiffany L, DO  QUEtiapine (SEROQUEL) 100 MG tablet TAKE 1 TABLET BY MOUTH NIGHTLY AT BEDTIME Patient taking differently: Take 100 mg by mouth at bedtime. 03/28/20  Yes Reed, Tiffany L, DO  traZODone (DESYREL) 100 MG tablet TAKE 1 TABLET BY MOUTH AT BEDTIME AS NEEDED FOR SLEEP Patient taking differently: Take 100 mg by mouth at bedtime as needed for sleep. 02/21/20  Yes Reed, Tiffany L, DO    Physical Exam: Constitutional: Moderately built and nourished. Vitals:   04/07/20 2030 04/07/20 2115 04/07/20 2205 04/07/20 2300  BP: (!) 87/73 (!) 145/73 (!) 156/107 (!) 145/118  Pulse: (!) 126 90 81 86  Resp: (!) 22 18 20 20   Temp:      TempSrc:      SpO2: 99% 99% 99% 94%   Eyes: Anicteric no pallor. ENMT: No discharge from the ears eyes nose or mouth. Neck: No mass felt.  No neck rigidity. Respiratory: No rhonchi or crepitations. Cardiovascular: S1-S2 heard. Abdomen: Soft nontender bowel sounds present. Musculoskeletal: No edema. Skin: No rash. Neurologic: Alert awake oriented her name.  Moves all extremities. Psychiatric: Oriented to name.   Labs on Admission: I have personally reviewed following labs and imaging studies  CBC: Recent Labs  Lab 04/07/20 1638  WBC 4.3   NEUTROABS 2.1  HGB 12.2  HCT 36.0  MCV 91.1  PLT 161   Basic Metabolic Panel: Recent Labs  Lab 04/07/20 1638  NA 140  K 3.7  CL 105  CO2 25  GLUCOSE 131*  BUN 13  CREATININE 0.71  CALCIUM 9.6   GFR: CrCl cannot be calculated (Unknown ideal weight.). Liver Function Tests: Recent Labs  Lab 04/07/20 1638  AST 26  ALT 24  ALKPHOS 74  BILITOT 0.7  PROT 6.7  ALBUMIN 3.8   Recent Labs  Lab 04/07/20 1638  LIPASE 30   No results for input(s): AMMONIA in the last 168 hours. Coagulation Profile: No results for input(s): INR, PROTIME in the last 168 hours. Cardiac Enzymes: No results for input(s): CKTOTAL, CKMB, CKMBINDEX, TROPONINI in the last 168 hours. BNP (last 3 results) No results for input(s): PROBNP in the last 8760 hours. HbA1C: No results for input(s): HGBA1C in the last 72 hours. CBG: Recent Labs  Lab 04/07/20 1646  GLUCAP 131*   Lipid Profile: No results for input(s): CHOL, HDL, LDLCALC, TRIG, CHOLHDL, LDLDIRECT in the last 72 hours. Thyroid Function Tests: Recent Labs    04/07/20 1657  TSH 0.283*   Anemia Panel: No results for input(s): VITAMINB12, FOLATE, FERRITIN, TIBC, IRON, RETICCTPCT in the last 72 hours. Urine analysis:    Component Value Date/Time   COLORURINE STRAW (A) 04/07/2020 1629   APPEARANCEUR CLEAR 04/07/2020 1629   LABSPEC >1.046 (H) 04/07/2020 1629   PHURINE 7.0 04/07/2020 1629   GLUCOSEU NEGATIVE 04/07/2020 1629   HGBUR NEGATIVE 04/07/2020 1629   HGBUR negative 02/14/2009 0938   BILIRUBINUR NEGATIVE 04/07/2020 1629   BILIRUBINUR Positive 10/20/2019 1042   KETONESUR NEGATIVE 04/07/2020 1629   PROTEINUR NEGATIVE 04/07/2020 1629   UROBILINOGEN negative (A) 10/20/2019 1042   UROBILINOGEN 0.2 06/18/2019 1708   NITRITE NEGATIVE 04/07/2020 1629   LEUKOCYTESUR NEGATIVE 04/07/2020 1629   Sepsis Labs: @LABRCNTIP (procalcitonin:4,lacticidven:4) ) Recent Results (from the past 240 hour(s))  Resp Panel by RT-PCR (Flu A&B,  Covid) Nasopharyngeal Swab     Status: None   Collection Time: 04/07/20  4:38 PM   Specimen: Nasopharyngeal Swab; Nasopharyngeal(NP) swabs in vial transport medium  Result Value Ref Range  Status   SARS Coronavirus 2 by RT PCR NEGATIVE NEGATIVE Final    Comment: (NOTE) SARS-CoV-2 target nucleic acids are NOT DETECTED.  The SARS-CoV-2 RNA is generally detectable in upper respiratory specimens during the acute phase of infection. The lowest concentration of SARS-CoV-2 viral copies this assay can detect is 138 copies/mL. A negative result does not preclude SARS-Cov-2 infection and should not be used as the sole basis for treatment or other patient management decisions. A negative result may occur with  improper specimen collection/handling, submission of specimen other than nasopharyngeal swab, presence of viral mutation(s) within the areas targeted by this assay, and inadequate number of viral copies(<138 copies/mL). A negative result must be combined with clinical observations, patient history, and epidemiological information. The expected result is Negative.  Fact Sheet for Patients:  EntrepreneurPulse.com.au  Fact Sheet for Healthcare Providers:  IncredibleEmployment.be  This test is no t yet approved or cleared by the [place] FDA and  has been authorized for detection and/or diagnosis of SARS-CoV-2 by FDA under an Emergency Use Authorization (EUA). This EUA will remain  in effect (meaning this test can be used) for the duration of the COVID-19 declaration under Section 564(b)(1) of the Act, 21 U.S.C.section 360bbb-3(b)(1), unless the authorization is terminated  or revoked sooner.       Influenza A by PCR NEGATIVE NEGATIVE Final   Influenza B by PCR NEGATIVE NEGATIVE Final    Comment: (NOTE) The Xpert Xpress SARS-CoV-2/FLU/RSV plus assay is intended as an aid in the diagnosis of influenza from Nasopharyngeal swab specimens and should  not be used as a sole basis for treatment. Nasal washings and aspirates are unacceptable for Xpert Xpress SARS-CoV-2/FLU/RSV testing.  Fact Sheet for Patients: EntrepreneurPulse.com.au  Fact Sheet for Healthcare Providers: IncredibleEmployment.be  This test is not yet approved or cleared by the [place] FDA and has been authorized for detection and/or diagnosis of SARS-CoV-2 by FDA under an Emergency Use Authorization (EUA). This EUA will remain in effect (meaning this test can be used) for the duration of the COVID-19 declaration under Section 564(b)(1) of the Act, 21 U.S.C. section 360bbb-3(b)(1), unless the authorization is terminated or revoked.  Performed at [facility], [place] [address] [place], [place] [address]      Radiological Exams on Admission: CT Head Wo Contrast  Result Date: 04/07/2020 CLINICAL DATA:  Ataxia, generalized weakness, nausea EXAM: CT HEAD WITHOUT CONTRAST TECHNIQUE: Contiguous axial images were obtained from the base of the skull through the vertex without intravenous contrast. COMPARISON:  08/06/2019 FINDINGS: Brain: Normal anatomic configuration. Parenchymal volume loss is commensurate with the patient's age. Extensive subcortical and periventricular periventricular white matter changes are present likely reflecting the sequela of small vessel ischemia. Remote lacunar infarct is noted within the anterior limb of the right internal capsule and left subinsular white matter. No abnormal intra or extra-axial mass lesion or fluid collection. No abnormal mass effect or midline shift. No evidence of acute intracranial hemorrhage or infarct. Ventricular size is normal. Cerebellum unremarkable. Vascular: No asymmetric hyperdense vasculature at the skull base. Skull: Intact Sinuses/Orbits: Paranasal sinuses are clear. Orbits are unremarkable. Other: Mastoid air cells and middle ear cavities are clear.  IMPRESSION: Advanced senescent changes. Remote lacunar infarcts. No evidence of acute intracranial hemorrhage or infarct. Electronically Signed   By: Fidela Salisbury MD   On: 04/07/2020 21:17   CT ABDOMEN PELVIS W CONTRAST  Result Date: 04/07/2020 CLINICAL DATA:  Weakness, nausea, history of cholecystectomy and hysterectomy EXAM: CT ABDOMEN  AND PELVIS WITH CONTRAST TECHNIQUE: Multidetector CT imaging of the abdomen and pelvis was performed using the standard protocol following bolus administration of intravenous contrast. CONTRAST:  139mL OMNIPAQUE IOHEXOL 300 MG/ML  SOLN COMPARISON:  07/31/2019 FINDINGS: Lower chest: No acute abnormality. Hepatobiliary: No focal liver abnormality is seen. Status post cholecystectomy. Unchanged postoperative biliary dilatation. Pancreas: Unremarkable. No pancreatic ductal dilatation or surrounding inflammatory changes. Spleen: Normal in size without significant abnormality. Adrenals/Urinary Tract: Adrenal glands are unremarkable. Kidneys are normal, without renal calculi, solid lesion, or hydronephrosis. Bladder is unremarkable. Stomach/Bowel: Stomach is within normal limits. Appendix is not clearly visualized and may be surgically absent. No evidence of bowel wall thickening, distention, or inflammatory changes. Moderate burden of stool throughout the colon and rectum. Vascular/Lymphatic: No significant vascular findings are present. No enlarged abdominal or pelvic lymph nodes. Reproductive: No mass or other significant abnormality. Other: No abdominal wall hernia or abnormality. No abdominopelvic ascites. Musculoskeletal: No acute or significant osseous findings. IMPRESSION: 1. No acute CT findings of the abdomen or pelvis to explain nausea or vomiting. 2. Moderate burden of stool throughout the colon and rectum. 3. Status post cholecystectomy with unchanged postoperative biliary dilatation. Electronically Signed   By: Eddie Candle M.D.   On: 04/07/2020 18:32   DG Chest  Portable 1 View  Result Date: 04/07/2020 CLINICAL DATA:  Cough EXAM: PORTABLE CHEST 1 VIEW COMPARISON:  08/06/2019 FINDINGS: The heart size and mediastinal contours are within normal limits. Both lungs are clear. The visualized skeletal structures are unremarkable. IMPRESSION: No acute abnormality of the lungs in AP portable projection. Electronically Signed   By: Eddie Candle M.D.   On: 04/07/2020 17:28    EKG: Independently reviewed.  Normal sinus rhythm.  Assessment/Plan Principal Problem:   Weakness generalized Active Problems:   Diabetes type 2, uncontrolled (H[facility])   HYPERLIPIDEMIA, MIXED   Essential hypertension   Vascular dementia with behavior disturbance ([facility])   Hypothyroidism   Ataxia   Weakness    1. Generalized weakness with difficulty ambulating patient requiring assist to ambulate which is new.  CT head was unremarkable.  Blood pressure was in the low normal when patient presented.  Will hold off Lasix and may have to hold amlodipine if continues to remain low normal blood pressure.  Will check MRI brain to make sure there was no stroke.  Physical therapy consult. 2. Hypertension we will hold off patient's amlodipine and Lasix for now and keep patient on as needed IV hydralazine follow blood pressure trends. 3. Hypothyroidism on Synthroid. 4. Diabetes mellitus type 2 on sliding scale coverage. 5. History of dementia on Namenda Seroquel. 6. Chronic low back pain on Norco. 7. Hyperlipidemia on statins.   DVT prophylaxis: Lovenox. Code Status: Full code. Family Communication: Patient's daughter. Disposition Plan: To be determined. Consults called: Physical therapy. Admission status: Observation.   Rise Patience MD [facility] Pager [phone].  If 7PM-7AM, please contact night-coverage [email or web address] Password [email or web address]  04/08/2020, 12:00 AM

## 2020-04-07 NOTE — ED Notes (Signed)
Updates provided to daughter. Daughter states patient has been unable to get out of bed at facility past few days. Daughters states decreased intake.

## 2020-04-07 NOTE — ED Triage Notes (Signed)
Patient resides at [facility], 2-3 days weakness, fatigue, and nausea. Denies vomiting, diarrhea, pain. 4mg  Zofran IV given with EMS

## 2020-04-07 NOTE — ED Notes (Signed)
Bed alarm placed.  

## 2020-04-08 ENCOUNTER — Other Ambulatory Visit: Payer: Self-pay | Admitting: Internal Medicine

## 2020-04-08 ENCOUNTER — Observation Stay (HOSPITAL_COMMUNITY): Payer: Medicare Other

## 2020-04-08 DIAGNOSIS — R442 Other hallucinations: Secondary | ICD-10-CM | POA: Diagnosis not present

## 2020-04-08 DIAGNOSIS — M2550 Pain in unspecified joint: Secondary | ICD-10-CM | POA: Diagnosis not present

## 2020-04-08 DIAGNOSIS — R600 Localized edema: Secondary | ICD-10-CM

## 2020-04-08 DIAGNOSIS — I10 Essential (primary) hypertension: Secondary | ICD-10-CM

## 2020-04-08 DIAGNOSIS — R531 Weakness: Secondary | ICD-10-CM | POA: Diagnosis not present

## 2020-04-08 DIAGNOSIS — Z7401 Bed confinement status: Secondary | ICD-10-CM | POA: Diagnosis not present

## 2020-04-08 DIAGNOSIS — R4182 Altered mental status, unspecified: Secondary | ICD-10-CM | POA: Diagnosis not present

## 2020-04-08 LAB — BASIC METABOLIC PANEL
Anion gap: 8 (ref 5–15)
BUN: 13 mg/dL (ref 8–23)
CO2: 24 mmol/L (ref 22–32)
Calcium: 8.7 mg/dL — ABNORMAL LOW (ref 8.9–10.3)
Chloride: 104 mmol/L (ref 98–111)
Creatinine, Ser: 0.75 mg/dL (ref 0.44–1.00)
GFR, Estimated: >60 mL/min (ref >60)
Glucose, Bld: 144 mg/dL — ABNORMAL HIGH (ref 70–99)
Potassium: 3.6 mmol/L (ref 3.5–5.1)
Sodium: 136 mmol/L (ref 135–145)

## 2020-04-08 LAB — CBC
HCT: 34.7 % — ABNORMAL LOW (ref 36.0–46.0)
Hemoglobin: 11.6 g/dL — ABNORMAL LOW (ref 12.0–15.0)
MCH: 30.4 pg (ref 26.0–34.0)
MCHC: 33.4 g/dL (ref 30.0–36.0)
MCV: 90.8 fL (ref 80.0–100.0)
Platelets: 162 10*3/uL (ref 150–400)
RBC: 3.82 MIL/uL — ABNORMAL LOW (ref 3.87–5.11)
RDW: 12.8 % (ref 11.5–15.5)
WBC: 4.2 10*3/uL (ref 4.0–10.5)
nRBC: 0 % (ref 0.0–0.2)

## 2020-04-08 LAB — URINE CULTURE: Culture: <10000 — AB

## 2020-04-08 MED ORDER — LORAZEPAM 2 MG/ML IJ SOLN
0.2500 mg | Freq: Once | INTRAMUSCULAR | Status: DC
  Filled 2020-04-08: qty 1

## 2020-04-08 MED ORDER — HYDRALAZINE HCL 20 MG/ML IJ SOLN: 10.0000 mg | INTRAMUSCULAR | Status: DC | PRN

## 2020-04-08 MED ORDER — MECLIZINE HCL 25 MG PO TABS: 12.5000 mg | ORAL_TABLET | Freq: Three times a day (TID) | ORAL | Status: DC | PRN

## 2020-04-08 NOTE — ED Notes (Signed)
Pt refusing to cooperate. Keeps repeating "I want to go home", Pt advised she will be returning via [facility]. Pt very confused.

## 2020-04-08 NOTE — ED Notes (Signed)
Patient is refusing all treatment

## 2020-04-08 NOTE — ED Notes (Signed)
Pt noted to be standing in doorway arguing with Cristal Deer.  Pt stating she was transferred from another place and she is wanting to go home.  Hx of dementia and doesn't understand she is holding for admission.  Pt noted to walk around room w/ steady gait and clear speech.

## 2020-04-08 NOTE — ED Notes (Signed)
Hospitalist at bedside 

## 2020-04-08 NOTE — Discharge Summary (Signed)
Physician Discharge Summary  Joy Ward NOB:[id number] DOB: [birth year] DOA: 04/07/2020  PCP: Gayland Curry, DO  Admit date: 04/07/2020 Discharge date: 04/08/2020  Admitted From: ALF Disposition:  ALF  Discharge Condition:Stable CODE STATUS:FULL Diet recommendation: Heart Healthy    Brief/Interim Summary:  HPI: Joy Ward is a 79 y.o. female with history of dementia, diabetes mellitus, hypertension, hypothyroidism was not eating well for last couple of days and found it difficult to ambulate and was brought to the ER.  As per the report patient did not have any nausea vomiting diarrhea or loss of consciousness.  ED Course: In the ER patient appeared nonfocal CT head was unremarkable and CT abdomen pelvis did not show any acute.  Labs unremarkable UA was unremarkable.  EKG showed normal sinus rhythm.  Following trying to ambulate the patient patient was finding it difficult and had to have assistance in trying to make walk.  This was new for the patient an MRI brain was ordered which is pending.  Covid test was negative.  Blood pressure was in the low normal at presentation.   Hospital course:  Patient's hospital course remained stable.  She was ambulating on the hallway without any problem.  Physical therapy recommended no follow-up.  Patient seen and examined at the bedside this morning.  She was hemodynamically stable during my evaluation without any complaints or distress.  She denied any dizziness. physical examination did not reveal any significant findings.  She was alert, awake and oriented to place.  Most likely she is on her baseline mental status.  I tried to call the daughter ,who didnot the phone.  I communicated with the social worker about the discharge planning today back to ALF.  Patient denied MRI.   Discharge Diagnoses:  Principal Problem:   Weakness generalized Active Problems:   Diabetes type 2, uncontrolled (HCC)   HYPERLIPIDEMIA, MIXED   Essential  hypertension   Vascular dementia with behavior disturbance (HCC)   Hypothyroidism   Ataxia   Weakness    Discharge Instructions  Discharge Instructions    Diet - low sodium heart healthy   Complete by: As directed    Discharge instructions   Complete by: As directed    1)Please follow up with your PCP in 1 to 2 weeks   Increase activity slowly   Complete by: As directed      Allergies as of 04/08/2020      Reactions   Clindamycin Hcl Other (See Comments)   REACTION: C. difficile colitis   Sulfamethoxazole-trimethoprim Other (See Comments)    Eye redness   Zanaflex [tizanidine Hcl] Anaphylaxis   Metronidazole Hives   Statins Other (See Comments)   Side Effect: muscle pain Has tried Zocor, Crestor--she does not want to take a statin, period   Bee Venom Other (See Comments)   Unknown reaction   Ace Inhibitors Cough   Adhesive [tape] Itching, Rash, Other (See Comments)   Blistering of skin      Medication List    TAKE these medications   acetaminophen 325 MG tablet Commonly known as: TYLENOL Take 650 mg by mouth every 6 (six) hours as needed for mild pain (fever greater than or equal to 101 degrees).   aluminum-magnesium hydroxide-simethicone 947-654-65 MG/5ML Susp Commonly known as: MAALOX Take 30 mLs by mouth every 6 (six) hours as needed (heartburn/indigestion).   amLODipine 5 MG tablet Commonly known as: NORVASC TAKE 1 TABLET BY MOUTH 1 time DAILY What changed: See the new instructions.   bisacodyl  10 MG suppository Commonly known as: DULCOLAX Place 1 suppository (10 mg total) rectally daily as needed for moderate constipation.   diclofenac Sodium 1 % Gel Commonly known as: Voltaren Apply 4 g topically daily as needed. What changed:   when to take this  additional instructions   donepezil 5 MG tablet Commonly known as: ARICEPT TAKE 1 TABLET BY MOUTH AT BEDTIME   Fish Oil 1000 MG Caps Take 1,000 mg by mouth daily.   furosemide 20 MG  tablet Commonly known as: LASIX TAKE 2 TABLETS BY MOUTH EVERY DAY   guaifenesin 100 MG/5ML syrup Commonly known as: ROBITUSSIN Take 200 mg by mouth every 6 (six) hours as needed for cough.   HYDROcodone-acetaminophen 10-325 MG tablet Commonly known as: NORCO Take 1 tablet by mouth See admin instructions. Take one tablet by mouth daily at 9am, 1pm, 5pm and 9pm   levothyroxine 100 MCG tablet Commonly known as: SYNTHROID TAKE 1 TABLET BY MOUTH EVERY DAY What changed: when to take this   loperamide 2 MG capsule Commonly known as: IMODIUM Take 2 mg by mouth as needed for diarrhea or loose stools (do not exceed 8 doses in 24 hours).   loratadine 10 MG tablet Commonly known as: CLARITIN Take 1 tablet (10 mg total) by mouth daily.   magnesium hydroxide 400 MG/5ML suspension Commonly known as: MILK OF MAGNESIA Take 30 mLs by mouth at bedtime as needed (constipation).   metFORMIN 500 MG tablet Commonly known as: GLUCOPHAGE Take 1 tablet (500 mg total) by mouth daily.   polyethylene glycol 17 g packet Commonly known as: MIRALAX / GLYCOLAX Take 17 g by mouth daily. What changed:   when to take this  reasons to take this   potassium chloride SA 20 MEQ tablet Commonly known as: KLOR-CON TAKE 1 TABLET BY MOUTH EVERY DAY   pravastatin 20 MG tablet Commonly known as: PRAVACHOL TAKE 1 TABLET BY MOUTH EVERY DAY AT 6 PM. What changed: See the new instructions.   QUEtiapine 100 MG tablet Commonly known as: SEROQUEL TAKE 1 TABLET BY MOUTH NIGHTLY AT BEDTIME What changed:   how much to take  how to take this  when to take this  additional instructions   traZODone 100 MG tablet Commonly known as: DESYREL TAKE 1 TABLET BY MOUTH AT BEDTIME AS NEEDED FOR SLEEP What changed:   reasons to take this  additional instructions   Triple Antibiotic [phone] Oint Apply 1 application topically as needed (minor skin tears/abrasions).   Viactiv Calcium Plus D 650-12.5-40  MG-MCG-MCG Chew Generic drug: Calcium-Vitamin D-Vitamin K Chew 1 tablet by mouth daily.       Follow-up Information    Reed, Tiffany L, DO. Schedule an appointment as soon as possible for a visit in 1 week(s).   Specialty: Geriatric Medicine Contact information: [address] [place] [address] [phone]              Allergies  Allergen Reactions  . Clindamycin Hcl Other (See Comments)    REACTION: C. difficile colitis  . Sulfamethoxazole-Trimethoprim Other (See Comments)     Eye redness  . Zanaflex [Tizanidine Hcl] Anaphylaxis  . Metronidazole Hives  . Statins Other (See Comments)    Side Effect: muscle pain Has tried Zocor, Crestor--she does not want to take a statin, period  . Bee Venom Other (See Comments)    Unknown reaction  . Ace Inhibitors Cough  . Adhesive [Tape] Itching, Rash and Other (See Comments)    Blistering  of skin     Consultations:  None   Procedures/Studies: CT Head Wo Contrast  Result Date: 04/07/2020 CLINICAL DATA:  Ataxia, generalized weakness, nausea EXAM: CT HEAD WITHOUT CONTRAST TECHNIQUE: Contiguous axial images were obtained from the base of the skull through the vertex without intravenous contrast. COMPARISON:  08/06/2019 FINDINGS: Brain: Normal anatomic configuration. Parenchymal volume loss is commensurate with the patient's age. Extensive subcortical and periventricular periventricular white matter changes are present likely reflecting the sequela of small vessel ischemia. Remote lacunar infarct is noted within the anterior limb of the right internal capsule and left subinsular white matter. No abnormal intra or extra-axial mass lesion or fluid collection. No abnormal mass effect or midline shift. No evidence of acute intracranial hemorrhage or infarct. Ventricular size is normal. Cerebellum unremarkable. Vascular: No asymmetric hyperdense vasculature at the skull base. Skull: Intact Sinuses/Orbits: Paranasal sinuses are clear.  Orbits are unremarkable. Other: Mastoid air cells and middle ear cavities are clear. IMPRESSION: Advanced senescent changes. Remote lacunar infarcts. No evidence of acute intracranial hemorrhage or infarct. Electronically Signed   By: Fidela Salisbury MD   On: 04/07/2020 21:17   CT ABDOMEN PELVIS W CONTRAST  Result Date: 04/07/2020 CLINICAL DATA:  Weakness, nausea, history of cholecystectomy and hysterectomy EXAM: CT ABDOMEN AND PELVIS WITH CONTRAST TECHNIQUE: Multidetector CT imaging of the abdomen and pelvis was performed using the standard protocol following bolus administration of intravenous contrast. CONTRAST:  172mL OMNIPAQUE IOHEXOL 300 MG/ML  SOLN COMPARISON:  07/31/2019 FINDINGS: Lower chest: No acute abnormality. Hepatobiliary: No focal liver abnormality is seen. Status post cholecystectomy. Unchanged postoperative biliary dilatation. Pancreas: Unremarkable. No pancreatic ductal dilatation or surrounding inflammatory changes. Spleen: Normal in size without significant abnormality. Adrenals/Urinary Tract: Adrenal glands are unremarkable. Kidneys are normal, without renal calculi, solid lesion, or hydronephrosis. Bladder is unremarkable. Stomach/Bowel: Stomach is within normal limits. Appendix is not clearly visualized and may be surgically absent. No evidence of bowel wall thickening, distention, or inflammatory changes. Moderate burden of stool throughout the colon and rectum. Vascular/Lymphatic: No significant vascular findings are present. No enlarged abdominal or pelvic lymph nodes. Reproductive: No mass or other significant abnormality. Other: No abdominal wall hernia or abnormality. No abdominopelvic ascites. Musculoskeletal: No acute or significant osseous findings. IMPRESSION: 1. No acute CT findings of the abdomen or pelvis to explain nausea or vomiting. 2. Moderate burden of stool throughout the colon and rectum. 3. Status post cholecystectomy with unchanged postoperative biliary dilatation.  Electronically Signed   By: Eddie Candle M.D.   On: 04/07/2020 18:32   DG Chest Portable 1 View  Result Date: 04/07/2020 CLINICAL DATA:  Cough EXAM: PORTABLE CHEST 1 VIEW COMPARISON:  08/06/2019 FINDINGS: The heart size and mediastinal contours are within normal limits. Both lungs are clear. The visualized skeletal structures are unremarkable. IMPRESSION: No acute abnormality of the lungs in AP portable projection. Electronically Signed   By: Eddie Candle M.D.   On: 04/07/2020 17:28      Subjective: Patient seen and examined the bedside this morning.  Stable for discharge back to ALF.  Discharge Exam: Vitals:   04/08/20 0739 04/08/20 0851  BP: (!) 149/112 (!) 143/81  Pulse: (!) 109 (!) 56  Resp: (!) 29 16  Temp:  98.3 F (36.8 C)  SpO2: 100% 99%   Vitals:   04/08/20 0540 04/08/20 0635 04/08/20 0739 04/08/20 0851  BP: 127/83 129/73 (!) 149/112 (!) 143/81  Pulse: 75 73 (!) 109 (!) 56  Resp: 17 15 (!) 29 16  Temp:  98.3 F (36.8 C)  TempSrc:    Oral  SpO2: 99% 97% 100% 99%  Weight:      Height:        General: Pt is alert, awake, not in acute distress Cardiovascular: RRR, S1/S2 +, no rubs, no gallops Respiratory: CTA bilaterally, no wheezing, no rhonchi Abdominal: Soft, NT, ND, bowel sounds + Extremities: no edema, no cyanosis    The results of significant diagnostics from this hospitalization (including imaging, microbiology, ancillary and laboratory) are listed below for reference.     Microbiology: Recent Results (from the past 240 hour(s))  Resp Panel by RT-PCR (Flu A&B, Covid) Nasopharyngeal Swab     Status: None   Collection Time: 04/07/20  4:38 PM   Specimen: Nasopharyngeal Swab; Nasopharyngeal(NP) swabs in vial transport medium  Result Value Ref Range Status   SARS Coronavirus 2 by RT PCR NEGATIVE NEGATIVE Final    Comment: (NOTE) SARS-CoV-2 target nucleic acids are NOT DETECTED.  The SARS-CoV-2 RNA is generally detectable in upper  respiratory specimens during the acute phase of infection. The lowest concentration of SARS-CoV-2 viral copies this assay can detect is 138 copies/mL. A negative result does not preclude SARS-Cov-2 infection and should not be used as the sole basis for treatment or other patient management decisions. A negative result may occur with  improper specimen collection/handling, submission of specimen other than nasopharyngeal swab, presence of viral mutation(s) within the areas targeted by this assay, and inadequate number of viral copies(<138 copies/mL). A negative result must be combined with clinical observations, patient history, and epidemiological information. The expected result is Negative.  Fact Sheet for Patients:  EntrepreneurPulse.com.au  Fact Sheet for Healthcare Providers:  IncredibleEmployment.be  This test is no t yet approved or cleared by the [place] FDA and  has been authorized for detection and/or diagnosis of SARS-CoV-2 by FDA under an Emergency Use Authorization (EUA). This EUA will remain  in effect (meaning this test can be used) for the duration of the COVID-19 declaration under Section 564(b)(1) of the Act, 21 U.S.C.section 360bbb-3(b)(1), unless the authorization is terminated  or revoked sooner.       Influenza A by PCR NEGATIVE NEGATIVE Final   Influenza B by PCR NEGATIVE NEGATIVE Final    Comment: (NOTE) The Xpert Xpress SARS-CoV-2/FLU/RSV plus assay is intended as an aid in the diagnosis of influenza from Nasopharyngeal swab specimens and should not be used as a sole basis for treatment. Nasal washings and aspirates are unacceptable for Xpert Xpress SARS-CoV-2/FLU/RSV testing.  Fact Sheet for Patients: EntrepreneurPulse.com.au  Fact Sheet for Healthcare Providers: IncredibleEmployment.be  This test is not yet approved or cleared by the [place] FDA and has been  authorized for detection and/or diagnosis of SARS-CoV-2 by FDA under an Emergency Use Authorization (EUA). This EUA will remain in effect (meaning this test can be used) for the duration of the COVID-19 declaration under Section 564(b)(1) of the Act, 21 U.S.C. section 360bbb-3(b)(1), unless the authorization is terminated or revoked.  Performed at [facility], [place] [address], [place], [place] [address]      Labs: BNP (last 3 results) No results for input(s): BNP in the last 8760 hours. Basic Metabolic Panel: Recent Labs  Lab 04/07/20 1638 04/08/20 0240  NA 140 136  K 3.7 3.6  CL 105 104  CO2 25 24  GLUCOSE 131* 144*  BUN 13 13  CREATININE 0.71 0.75  CALCIUM 9.6 8.7*   Liver Function Tests: Recent Labs  Lab 04/07/20  1638  AST 26  ALT 24  ALKPHOS 74  BILITOT 0.7  PROT 6.7  ALBUMIN 3.8   Recent Labs  Lab 04/07/20 1638  LIPASE 30   No results for input(s): AMMONIA in the last 168 hours. CBC: Recent Labs  Lab 04/07/20 1638 04/08/20 0315  WBC 4.3 4.2  NEUTROABS 2.1  --   HGB 12.2 11.6*  HCT 36.0 34.7*  MCV 91.1 90.8  PLT 185 162   Cardiac Enzymes: No results for input(s): CKTOTAL, CKMB, CKMBINDEX, TROPONINI in the last 168 hours. BNP: Invalid input(s): POCBNP CBG: Recent Labs  Lab 04/07/20 1646  GLUCAP 131*   D-Dimer No results for input(s): DDIMER in the last 72 hours. Hgb A1c No results for input(s): HGBA1C in the last 72 hours. Lipid Profile No results for input(s): CHOL, HDL, LDLCALC, TRIG, CHOLHDL, LDLDIRECT in the last 72 hours. Thyroid function studies Recent Labs    04/07/20 1657  TSH 0.283*   Anemia work up No results for input(s): VITAMINB12, FOLATE, FERRITIN, TIBC, IRON, RETICCTPCT in the last 72 hours. Urinalysis    Component Value Date/Time   COLORURINE STRAW (A) 04/07/2020 1629   APPEARANCEUR CLEAR 04/07/2020 1629   LABSPEC >1.046 (H) 04/07/2020 1629   PHURINE 7.0 04/07/2020 1629   GLUCOSEU NEGATIVE  04/07/2020 1629   HGBUR NEGATIVE 04/07/2020 1629   HGBUR negative 02/14/2009 0938   BILIRUBINUR NEGATIVE 04/07/2020 1629   BILIRUBINUR Positive 10/20/2019 1042   KETONESUR NEGATIVE 04/07/2020 1629   PROTEINUR NEGATIVE 04/07/2020 1629   UROBILINOGEN negative (A) 10/20/2019 1042   UROBILINOGEN 0.2 06/18/2019 1708   NITRITE NEGATIVE 04/07/2020 1629   LEUKOCYTESUR NEGATIVE 04/07/2020 1629   Sepsis Labs Invalid input(s): PROCALCITONIN,  WBC,  LACTICIDVEN Microbiology Recent Results (from the past 240 hour(s))  Resp Panel by RT-PCR (Flu A&B, Covid) Nasopharyngeal Swab     Status: None   Collection Time: 04/07/20  4:38 PM   Specimen: Nasopharyngeal Swab; Nasopharyngeal(NP) swabs in vial transport medium  Result Value Ref Range Status   SARS Coronavirus 2 by RT PCR NEGATIVE NEGATIVE Final    Comment: (NOTE) SARS-CoV-2 target nucleic acids are NOT DETECTED.  The SARS-CoV-2 RNA is generally detectable in upper respiratory specimens during the acute phase of infection. The lowest concentration of SARS-CoV-2 viral copies this assay can detect is 138 copies/mL. A negative result does not preclude SARS-Cov-2 infection and should not be used as the sole basis for treatment or other patient management decisions. A negative result may occur with  improper specimen collection/handling, submission of specimen other than nasopharyngeal swab, presence of viral mutation(s) within the areas targeted by this assay, and inadequate number of viral copies(<138 copies/mL). A negative result must be combined with clinical observations, patient history, and epidemiological information. The expected result is Negative.  Fact Sheet for Patients:  EntrepreneurPulse.com.au  Fact Sheet for Healthcare Providers:  IncredibleEmployment.be  This test is no t yet approved or cleared by the [place] FDA and  has been authorized for detection and/or diagnosis of SARS-CoV-2  by FDA under an Emergency Use Authorization (EUA). This EUA will remain  in effect (meaning this test can be used) for the duration of the COVID-19 declaration under Section 564(b)(1) of the Act, 21 U.S.C.section 360bbb-3(b)(1), unless the authorization is terminated  or revoked sooner.       Influenza A by PCR NEGATIVE NEGATIVE Final   Influenza B by PCR NEGATIVE NEGATIVE Final    Comment: (NOTE) The Xpert Xpress SARS-CoV-2/FLU/RSV plus assay is intended as  an aid in the diagnosis of influenza from Nasopharyngeal swab specimens and should not be used as a sole basis for treatment. Nasal washings and aspirates are unacceptable for Xpert Xpress SARS-CoV-2/FLU/RSV testing.  Fact Sheet for Patients: EntrepreneurPulse.com.au  Fact Sheet for Healthcare Providers: IncredibleEmployment.be  This test is not yet approved or cleared by the [place] FDA and has been authorized for detection and/or diagnosis of SARS-CoV-2 by FDA under an Emergency Use Authorization (EUA). This EUA will remain in effect (meaning this test can be used) for the duration of the COVID-19 declaration under Section 564(b)(1) of the Act, 21 U.S.C. section 360bbb-3(b)(1), unless the authorization is terminated or revoked.  Performed at [facility], [place] [address], [place], [place] [address]     Please note: You were cared for by a hospitalist during your hospital stay. Once you are discharged, your primary care physician will handle any further medical issues. Please note that NO REFILLS for any discharge medications will be authorized once you are discharged, as it is imperative that you return to your primary care physician (or establish a relationship with a primary care physician if you do not have one) for your post hospital discharge needs so that they can reassess your need for medications and monitor your lab values.    Time coordinating  discharge: 40 minutes  SIGNED:   Shelly Coss, MD  [facility] 04/08/2020, 9:46 AM Pager [phone]  If 7PM-7AM, please contact night-coverage [email or web address] Password [email or web address]

## 2020-04-08 NOTE — ED Notes (Signed)
Pt refusing to cooperate, will not allow vitals, will not agree to medications or further testing.

## 2020-04-08 NOTE — ED Notes (Signed)
Hospitalist reports he is going to d/c Pt.  Pt will need to see TOC prior to d/c.

## 2020-04-08 NOTE — Evaluation (Signed)
Physical Therapy Evaluation Patient Details Name: Joy Ward MRN: [id number] DOB: 1940/07/21 Today's Date: 04/08/2020   History of Present Illness  Joy Ward is a 79 y.o. female with history of dementia, diabetes mellitus, hypertension, hypothyroidism was not eating well for last couple of days and found it difficult to ambulate and was brought to the ER  Clinical Impression  Patient is  Confused about the current  Situation, declined to ambulate in hall, then  Ambulated out of room requiring no assistance.. No deficits noted. No further PT indicated. Anticipate DC back to ALF.  Follow Up Recommendations No PT follow up    Equipment Recommendations  None recommended by PT    Recommendations for Other Services       Precautions / Restrictions Precautions Precautions: Fall      Mobility  Bed Mobility               General bed mobility comments: in recliner    Transfers   Equipment used: None                Ambulation/Gait Ambulation/Gait assistance: Independent   Assistive device: None Gait Pattern/deviations: WFL(Within Functional Limits)   Gait velocity interpretation: <1.31 ft/sec, indicative of household ambulator General Gait Details: patient up ad libe, ambulating to BR with CNA .  Stairs            Wheelchair Mobility    Modified Rankin (Stroke Patients Only)       Balance Overall balance assessment: No apparent balance deficits (not formally assessed)                                           Pertinent Vitals/Pain Pain Assessment: No/denies pain    Home Living Family/patient expects to be discharged to:: Assisted living               Home Equipment: None      Prior Function           Comments: not sure of accuracy of all hx info given  but pt knows she now lives at assisted living and has been there few months     Hand Dominance        Extremity/Trunk Assessment   Upper Extremity  Assessment Upper Extremity Assessment: Overall WFL for tasks assessed    Lower Extremity Assessment Lower Extremity Assessment: Overall WFL for tasks assessed    Cervical / Trunk Assessment Cervical / Trunk Assessment: Normal  Communication   Communication: No difficulties  Cognition Arousal/Alertness: Awake/alert Behavior During Therapy: WFL for tasks assessed/performed Overall Cognitive Status: No family/caregiver present to determine baseline cognitive functioning                                 General Comments: patient expressing some paranoia. Able to follow directions with increased time, oriented to hospital.      General Comments      Exercises     Assessment/Plan    PT Assessment Patent does not need any further PT services  PT Problem List         PT Treatment Interventions      PT Goals (Current goals can be found in the Care Plan section)  Acute Rehab PT Goals Patient Stated Goal: I don't want to walk out there. PT Goal Formulation: All  assessment and education complete, DC therapy    Frequency     Barriers to discharge        Co-evaluation               AM-PAC PT "6 Clicks" Mobility  Outcome Measure Help needed turning from your back to your side while in a flat bed without using bedrails?: None Help needed moving from lying on your back to sitting on the side of a flat bed without using bedrails?: None Help needed moving to and from a bed to a chair (including a wheelchair)?: None Help needed standing up from a chair using your arms (e.g., wheelchair or bedside chair)?: None Help needed to walk in hospital room?: None Help needed climbing 3-5 steps with a railing? : A Little 6 Click Score: 23    End of Session   Activity Tolerance: Patient tolerated treatment well Patient left: in chair;with nursing/sitter in room Nurse Communication: Mobility status PT Visit Diagnosis: Difficulty in walking, not elsewhere classified  (R26.2)    Time: 8022-3361 PT Time Calculation (min) (ACUTE ONLY): 10 min   Charges:   PT Evaluation $PT Eval Low Complexity: [place] Pager [phone] Office [phone]   Claretha Cooper 04/08/2020, 9:32 AM

## 2020-04-08 NOTE — Progress Notes (Signed)
..   Transition of Care [facility]) - Emergency Department Mini Assessment   Patient Details  Name: Joy Ward MRN: [id number] Date of Birth: 1941-02-27  Transition of Care [facility]) CM/SW Contact:    Schuyler Olden C Tarpley-Carter, [place] Phone Number: 04/08/2020, 9:34 AM   Clinical Narrative: TOC CM/CSW spoke with pt about her needs.  CSW actively listened as pt spoke about concerns.  Pt stated she wanted to return home to [facility].  Pt also gave CSW permission to contact her daughters.  Michail Jewels Tarpley-Carter, MSW, LCSW-A Pronouns:  She, Her, Hers                  [facility] Transitions of CareClinical Social Worker [email or web address] [phone]   ED Mini Assessment:    Barriers to Discharge: No Barriers Identified        Interventions which prevented an admission or readmission: Other (must enter comment) (Return home to [facility])    Patient Contact and [place] 1: Kioni Stahl (Daughter)   [phone] [place] Contact 2: Rosario Jacks (Daughter)       [phone] Spoke with: CSW left HIPPA compliant message with contact information. Contact Date: 04/08/20,   Contact time: [phone]   Call outcome: No answer, voicemail message left.      Choice offered to / list presented to : Adult Children,Patient  Admission diagnosis:  Ataxia [R27.0] Weakness [R53.1] Patient Active Problem List   Diagnosis Date Noted  . Ataxia 04/07/2020  . Weakness generalized 04/07/2020  . Weakness 04/07/2020  . Carpal tunnel syndrome of left wrist 02/20/2020  . Diabetic polyneuropathy associated with type 2 diabetes mellitus ([place]) 02/20/2020  . Lower GI bleed 07/31/2019  . Syncope 07/30/2019  . Pancytopenia ([place]) 07/30/2019  . Hyperchloremia 07/30/2019  . Metabolic acidosis 29/05/1113  . Hypocalcemia 07/30/2019  . Palliative care patient 07/03/2019  . Hallucinations 06/24/2019  . Metabolic encephalopathy 52/11/221  . Altered  mental status   . Recurrent UTI 04/30/2019  . Vascular dementia with behavior disturbance ([place]) 04/14/2019  . Primary insomnia 04/14/2019  . Hypothyroidism 04/14/2019  . Diabetes mellitus ([place]) 04/14/2019  . Memory loss or impairment 03/29/2019  . Diabetic peripheral neuropathy associated with type 2 diabetes mellitus ([place]) 12/29/2017  . Peripheral edema 12/29/2017  . Elevated liver enzymes 10/15/2016  . Chest pain 04/22/2016  . Systolic murmur 36/03/2448  . Hypercalcemia 04/22/2016  . Hyperlipidemia 04/22/2016  . Baker's cyst of knee, right 04/21/2016  . HYPOKALEMIA 01/24/2010  . CONSTIPATION 12/23/2009  . INTERMITTENT VERTIGO 12/23/2009  . SINUSITIS, ACUTE 04/25/2009  . VAGINITIS, CANDIDAL 02/14/2009  . HIP PAIN, RIGHT 02/14/2009  . HYPERLIPIDEMIA, MIXED 01/01/2009  . BACK PAIN, LUMBAR, WITH RADICULOPATHY 12/25/2008  . KNEE PAIN, BILATERAL 12/18/2008  . TROCHANTERIC BURSITIS, BILATERAL 12/18/2008  . Disturbance in sleep behavior 12/18/2008  . ONYCHOMYCOSIS, TOENAILS 10/30/2008  . ENTERITIS, CLOSTRIDIUM DIFFICILE 10/15/2006  . Diabetes type 2, uncontrolled ([place]) 09/18/2006  . Essential hypertension 09/18/2006  . Allergic rhinitis 09/18/2006  . GOITER, MULTINODULAR 06/29/2006  . STROKE 04/27/2001   PCP:  Gayland Curry, DO Pharmacy:  No Pharmacies Listed

## 2020-04-08 NOTE — Progress Notes (Addendum)
TOC CM/CSW spoke with [place] [phone].  Pt can return to [facility] once she is dc'd.  [place] is awaiting her arrival.  Jenny Reichmann, RN has been updated with the following info as well.  CSW will continue to follow for dc needs.  Jammy Stlouis Tarpley-Carter, MSW, LCSW-A Pronouns:  She, Her, Hers                  [place] ED Transitions of CareClinical Social Worker [email or web address] [phone]

## 2020-04-08 NOTE — Progress Notes (Signed)
Pt very aggressively refusing MRI. Pt states she is not here for an MRI. Pt refusing at this time. Multiple attempts at explaining why the MRI was ordered by the RN Clarise Cruz and technologist. Per Pt " You don't just walk in here at 5 AM and tell folks they need an MRI" Forde Dandy was had at 7:00am, clinical education provided to the pt about why MRI was needed. Pt still refuses. I do believe the pt dementia is large factor in her refusing her scan. Will check back later to see if pt is more agreeable for attempting her test.

## 2020-04-08 NOTE — Progress Notes (Signed)
TOC CM/CSW attempted to contact pts daughters [name] [phone] and Levada Dy [phone].  CSW left HIPPA compliant message with my contact information.  CSW will continue to follow for dc needs.  Stassi Fadely Tarpley-Carter, MSW, LCSW-A Pronouns:  She, Her, Hers                  [facility] Transitions of CareClinical Social Worker [email or web address] [phone]

## 2020-04-12 ENCOUNTER — Telehealth: Payer: Self-pay

## 2020-04-12 ENCOUNTER — Ambulatory Visit: Payer: Medicare Other | Admitting: Nurse Practitioner

## 2020-04-16 ENCOUNTER — Other Ambulatory Visit: Payer: Self-pay

## 2020-04-16 ENCOUNTER — Encounter: Payer: Self-pay | Admitting: Orthopedic Surgery

## 2020-04-16 ENCOUNTER — Ambulatory Visit: Payer: Self-pay | Admitting: Family

## 2020-04-16 ENCOUNTER — Ambulatory Visit (INDEPENDENT_AMBULATORY_CARE_PROVIDER_SITE_OTHER): Payer: Medicare Other | Admitting: Orthopedic Surgery

## 2020-04-16 ENCOUNTER — Encounter: Payer: Self-pay | Admitting: Internal Medicine

## 2020-04-16 VITALS — BP 142/80 | HR 84 | Temp 96.2°F | Ht 64.0 in | Wt 158.0 lb

## 2020-04-16 DIAGNOSIS — G8929 Other chronic pain: Secondary | ICD-10-CM | POA: Diagnosis not present

## 2020-04-16 DIAGNOSIS — I10 Essential (primary) hypertension: Secondary | ICD-10-CM | POA: Diagnosis not present

## 2020-04-16 DIAGNOSIS — F0151 Vascular dementia with behavioral disturbance: Secondary | ICD-10-CM | POA: Diagnosis not present

## 2020-04-16 DIAGNOSIS — F01518 Vascular dementia, unspecified severity, with other behavioral disturbance: Secondary | ICD-10-CM

## 2020-04-16 DIAGNOSIS — E039 Hypothyroidism, unspecified: Secondary | ICD-10-CM | POA: Diagnosis not present

## 2020-04-16 DIAGNOSIS — M5416 Radiculopathy, lumbar region: Secondary | ICD-10-CM

## 2020-04-16 DIAGNOSIS — R030 Elevated blood-pressure reading, without diagnosis of hypertension: Secondary | ICD-10-CM | POA: Diagnosis not present

## 2020-04-16 DIAGNOSIS — E038 Other specified hypothyroidism: Secondary | ICD-10-CM | POA: Diagnosis not present

## 2020-04-16 DIAGNOSIS — K5901 Slow transit constipation: Secondary | ICD-10-CM

## 2020-04-16 MED ORDER — BISACODYL 10 MG RE SUPP
10.0000 mg | Freq: Every day | RECTAL | 0 refills | Status: DC | PRN
Start: 2020-04-16 — End: 2020-07-10

## 2020-04-16 MED ORDER — SENNA 8.6 MG PO TABS: 1.0000 | ORAL_TABLET | ORAL | 0 refills | Status: DC

## 2020-04-16 MED ORDER — LEVOTHYROXINE SODIUM 112 MCG PO TABS
112.0000 ug | ORAL_TABLET | Freq: Every day | ORAL | 3 refills | Status: DC
Start: 2020-04-16 — End: 2020-05-31

## 2020-04-16 NOTE — Progress Notes (Signed)
Careteam: Patient Care Team: Kermit Baloeed, Tiffany L, DO as PCP - General (Geriatric Medicine)  Seen by: Hazle NordmannAmy Meklit Cotta, AGNP-C  [facility]  Advanced Directive information    Allergies  Allergen Reactions  . Clindamycin Hcl Other (See Comments)    REACTION: C. difficile colitis  . Sulfamethoxazole-Trimethoprim Other (See Comments)     Eye redness  . Zanaflex [Tizanidine Hcl] Anaphylaxis  . Metronidazole Hives  . Statins Other (See Comments)    Side Effect: muscle pain Has tried Zocor, Crestor--she does not want to take a statin, period  . Bee Venom Other (See Comments)    Unknown reaction  . Ace Inhibitors Cough  . Adhesive [Tape] Itching, Rash and Other (See Comments)    Blistering of skin     Chief Complaint  Patient presents with  . Advice Only     HPI: Patient is a 79 y.o. female seen today for hospital follow up.  [facility] with increased weakness. PMH includes:  Hypertension, stroke, allergic rhinitis, multinodular goiter, hypothyroidism, type 2 diabetes mellitus, vascular dementia, hyperlipidemia, and constipation. CT abdomen and head was unremarkable. Labs, UA, EKG unremarkable. She was admitted overnight for observation. During her hospital stay she received physical therapy and was able to ambulate hallways within one day. She remained hemodynamically stable during her stay and was [facility].   Today she presents with her caregiver for follow-up. She is alert to self, person, and place. Disoriented to time and situation. She can follow commands and express needs. She is walking on her own without assistive device. No recent falls or injuries. Eating 2-3 meals daily. States she has been feeling tired lately. She wants to always sleep. She cannot state how many hours she sleeps a night. Her TSH during hospitalization was low. [facility]  [facility] recorded being given at 5:55AM. All other morning medications given at 9:38AM. In addition, she is asking for a suppository. Her MAR states she can have bisacodyl prn, milk of magnesia prn, and miralax prn. Cannot remember if she eats enough fruits and vegetables in her diet. Caregiver states she drinks a good amount of water daily.    Daughter requesting voltaren to be left in her room for patient to apply on her own. Patient states they have chronic right shoulder pain. She has voltaren ordered prn daily. Does not want responsibility of applying gel, would like nursing staff to administer medication.         Review of Systems:  Review of Systems  Constitutional: Positive for fever and malaise/fatigue. Negative for weight loss.  HENT: Negative for congestion, hearing loss and sore throat.   Respiratory: Negative for cough, shortness of breath and wheezing.   Cardiovascular: Positive for leg swelling. Negative for chest pain and palpitations.  Gastrointestinal: Positive for constipation. Negative for abdominal pain, heartburn and nausea.  Genitourinary: Negative for dysuria and hematuria.  Musculoskeletal: Positive for joint pain and myalgias.  Neurological: Positive for weakness. Negative for dizziness and headaches.  Psychiatric/Behavioral: Positive for memory loss. Negative for depression. The patient is not nervous/anxious.     Past Medical History:  Diagnosis Date  . Allergy   . Arthritis   . Chronic back pain   . DDD (degenerative disc disease), lumbar   . Diabetes mellitus without complication (HCC)   . Diabetes type 2, uncontrolled (HCC) 09/18/2006   Annotation:  Noninsulin dependent Qualifier: Diagnosis of  By: Amil Amen MD, Benjamine Mola    . Former tobacco use   . Hyperlipidemia    a. H/o muscle aches with statins per PCP notes.  . Hypertension   . Hypothyroidism    a. prior thyroid removal.  . Memory loss or impairment 03/29/2019  . Stroke [facility])    a. Listed  in PCP notes: "History of stroke:  History of terrible headache.  Had a CT scan of brain and found "ministroke"  Was removed from ERT subsequently. "   Past Surgical History:  Procedure Laterality Date  . ABDOMINAL HYSTERECTOMY  1994   TAH, not clear if unilateral oophorectomy as well.  Marland Kitchen [place]  . CHOLECYSTECTOMY  1973   open  . DECOMPRESSIVE LUMBAR LAMINECTOMY LEVEL 4  2005   In [place]  . THYROIDECTOMY  2012   multinodular goiter   Social History:   reports that she has quit smoking. She has never used smokeless tobacco. She reports that she does not drink alcohol and does not use drugs.  Family History  Problem Relation Age of Onset  . Heart disease Mother        unclear details "atherosclerosis"  . Diabetes Mother   . Alcohol abuse Sister   . Diabetes Brother   . Diabetes Brother   . Drug abuse Brother   . Diabetes Daughter   . Heart disease Son        CHF  . Alcohol abuse Son     Medications: Patient's Medications  New Prescriptions   No medications on file  Previous Medications   ACETAMINOPHEN (TYLENOL) 325 MG TABLET    Take 650 mg by mouth every 6 (six) hours as needed for mild pain (fever greater than or equal to 101 degrees).   ALUMINUM-MAGNESIUM HYDROXIDE-SIMETHICONE (MAALOX) [phone] MG/5ML SUSP    Take 30 mLs by mouth every 6 (six) hours as needed (heartburn/indigestion).   AMLODIPINE (NORVASC) 5 MG TABLET    TAKE 1 TABLET BY MOUTH 1 time DAILY   BISACODYL (DULCOLAX) 10 MG SUPPOSITORY    Place 1 suppository (10 mg total) rectally daily as needed for moderate constipation.   CALCIUM-VITAMIN D-VITAMIN K (VIACTIV CALCIUM PLUS D) 650-12.5-40 MG-MCG-MCG CHEW    Chew 1 tablet by mouth daily.   DONEPEZIL (ARICEPT) 5 MG TABLET    TAKE 1 TABLET BY MOUTH AT BEDTIME   FUROSEMIDE (LASIX) 20 MG TABLET    TAKE 2 TABLETS BY MOUTH EVERY DAY   GUAIFENESIN (ROBITUSSIN) 100 MG/5ML SYRUP    Take 200 mg by mouth every 6 (six) hours as needed for  cough.   HYDROCODONE-ACETAMINOPHEN (NORCO) 10-325 MG TABLET    Take 1 tablet by mouth See admin instructions. Take one tablet by mouth daily at 9am, 1pm, 5pm and 9pm   LEVOTHYROXINE (SYNTHROID) 100 MCG TABLET    TAKE 1 TABLET BY MOUTH EVERY DAY   LOPERAMIDE (IMODIUM) 2 MG CAPSULE    Take 2 mg by mouth as needed for diarrhea or loose stools (do not exceed 8 doses in 24 hours).   LORATADINE (CLARITIN) 10 MG TABLET    Take 1 tablet (10 mg total) by mouth daily.   MAGNESIUM HYDROXIDE (MILK OF MAGNESIA) 400 MG/5ML SUSPENSION    Take 30 mLs by mouth at bedtime as needed (constipation).   METFORMIN (GLUCOPHAGE) 500 MG TABLET    TAKE 1 TABLET BY MOUTH EVERY DAY   NEOMYCIN-BACITRACIN-POLYMYXIN (TRIPLE ANTIBIOTIC) [phone] OINT  Apply 1 application topically as needed (minor skin tears/abrasions).   OMEGA-3 FATTY ACIDS (FISH OIL) 1000 MG CAPS    Take 1,000 mg by mouth daily.    POLYETHYLENE GLYCOL (MIRALAX / GLYCOLAX) 17 G PACKET    Take 17 g by mouth daily.   POTASSIUM CHLORIDE SA (KLOR-CON) 20 MEQ TABLET    TAKE 1 TABLET BY MOUTH EVERY DAY   PRAVASTATIN (PRAVACHOL) 20 MG TABLET    TAKE 1 TABLET BY MOUTH EVERY DAY AT 6 PM.   QUETIAPINE (SEROQUEL) 100 MG TABLET    TAKE 1 TABLET BY MOUTH NIGHTLY AT BEDTIME   TRAZODONE (DESYREL) 100 MG TABLET    TAKE 1 TABLET BY MOUTH AT BEDTIME AS NEEDED FOR SLEEP  Modified Medications   No medications on file  Discontinued Medications   DICLOFENAC SODIUM (VOLTAREN) 1 % GEL    Apply 4 g topically daily as needed.    Physical Exam:  Vitals:   04/16/20 1406  BP: (!) 142/80  Pulse: 84  Temp: (!) 96.2 F (35.7 C)  TempSrc: Temporal  SpO2: 99%  Weight: 158 lb (71.7 kg)  Height: 5\' 4"  (1.626 m)   Body mass index is 27.12 kg/m. Wt Readings from Last 3 Encounters:  04/16/20 158 lb (71.7 kg)  04/08/20 158 lb 1.1 oz (71.7 kg)  02/20/20 158 lb (71.7 kg)    Physical Exam Vitals reviewed.  Constitutional:      Appearance: Normal appearance.  HENT:      Head: Normocephalic.     Mouth/Throat:     Mouth: Mucous membranes are moist.  Neck:     Thyroid: Thyromegaly present. No thyroid tenderness.  Cardiovascular:     Rate and Rhythm: Normal rate and regular rhythm.     Pulses: Normal pulses.     Heart sounds: Normal heart sounds. No murmur heard.   Pulmonary:     Effort: Pulmonary effort is normal. No respiratory distress.     Breath sounds: Normal breath sounds. No wheezing.  Abdominal:     General: Abdomen is flat. Bowel sounds are normal. There is no distension.     Palpations: Abdomen is soft.     Tenderness: There is no abdominal tenderness.     Comments: Active bowel sounds X4  Musculoskeletal:     Cervical back: Normal range of motion.     Right lower leg: Edema present.     Left lower leg: Edema present.     Comments: Non-pitting  Lymphadenopathy:     Cervical: No cervical adenopathy.  Skin:    General: Skin is warm and dry.     Capillary Refill: Capillary refill takes less than 2 seconds.  Neurological:     General: No focal deficit present.     Mental Status: She is alert. Mental status is at baseline.     Motor: No weakness.  Psychiatric:        Mood and Affect: Mood normal.        Behavior: Behavior normal.        Cognition and Memory: Memory is impaired.     Labs reviewed: Basic Metabolic Panel: Recent Labs    05/03/19 0845 05/04/19 0500 05/05/19 0500 05/24/19 1051 07/30/19 2215 07/31/19 0010 08/07/19 0436 08/07/19 1404 09/06/19 1156 09/17/19 0824 11/09/19 1458 04/07/20 1638 04/07/20 1657 04/08/20 0240  NA 137 139 138   < >  --    < > 140   < > 139   < > 140 140  --  136  K 4.2 5.2* 4.0   < >  --    < > 2.9*   < > 4.2   < > 3.7 3.7  --  3.6  CL 101 101 101   < >  --    < > 106   < > 104   < > 103 105  --  104  CO2 25 32 22   < >  --    < > 24   < > 24   < > 27 25  --  24  GLUCOSE 136* 140* 127*   < >  --    < > 122*  --  142*   < > 124 131*  --  144*  BUN 8 7* 11   < >  --    < > 8   < > 9   <  > 14 13  --  13  CREATININE 0.69 0.87 0.63   < >  --    < > 0.71   < > 0.66   < > 0.94* 0.71  --  0.75  CALCIUM 9.2 9.7 9.6   < >  --    < > 9.3   < > 10.0   < > 10.1 9.6  --  8.7*  MG 2.0 2.0 1.8  --   --   --  1.7  --   --   --   --   --   --   --   PHOS 2.8 3.6 2.9  --   --   --   --   --   --   --   --   --   --   --   TSH  --   --   --    < > 39.015*  --   --   --  4.10  --   --   --  0.283*  --    < > = values in this interval not displayed.   Liver Function Tests: Recent Labs    08/06/19 1221 09/06/19 1156 09/17/19 0824 04/07/20 1638  AST 21 20 20 26   ALT 15 12 12 24   ALKPHOS 55  --  69 74  BILITOT 0.8 0.5 0.6 0.7  PROT 6.5 7.1 6.9 6.7  ALBUMIN 3.5  --  4.1 3.8   Recent Labs    04/07/20 1638  LIPASE 30   No results for input(s): AMMONIA in the last 8760 hours. CBC: Recent Labs    09/06/19 1156 09/17/19 0824 04/07/20 1638 04/08/20 0315  WBC 4.3 3.7* 4.3 4.2  NEUTROABS 2,511 1.1* 2.1  --   HGB 12.7 13.2 12.2 11.6*  HCT 38.2 40.7 36.0 34.7*  MCV 89.0 91.3 91.1 90.8  PLT 227 181 185 162   Lipid Panel: No results for input(s): CHOL, HDL, LDLCALC, TRIG, CHOLHDL, LDLDIRECT in the last 8760 hours. TSH: Recent Labs    07/30/19 2215 09/06/19 1156 04/07/20 1657  TSH 39.015* 4.10 0.283*   A1C: Lab Results  Component Value Date   HGBA1C 6.5 (H) 09/06/2019     Assessment/Plan 1. Vascular dementia with behavior disturbance ([place]) - stable at this time, disoriented with time and short term memory - no behavioral outbursts reported by caregiver - continue Aricept and seroquel  2. Chronic radicular low back pain - stable, she is able to ambulate on her own - continue norco regimen  3. Essential hypertension - bp at goal, <  150/90 - continue amlodipine and lasix regimen  4. Elevated blood pressure reading - stable at this time with medication  5. Hypothyroidism, unspecified type - TSH during hospitalization 0.283 - [facility] administering  medication on empty stomach - patient is complaining of constipation and fatigue - will increase levothyroxine to 112 mcg PO Daily - recheck TSH in 6 weeks - levothyroxine (SYNTHROID) 112 MCG tablet; Take 1 tablet (112 mcg total) by mouth daily.  Dispense: 90 tablet; Refill: 3 - TSH; Future  6. Slow transit constipation - abdomen is not distended, active bowel sounds x4, passing gas - she has many prn orders for constipation, but nothing consistent - will add senna PO three times weekly - senna (SENOKOT) 8.6 MG TABS tablet; Take 1 tablet (8.6 mg total) by mouth every Tuesday, Thursday, and Saturday at 6 PM.  Dispense: 120 tablet; Refill: 0 - bisacodyl (DULCOLAX) 10 MG suppository; Place 1 suppository (10 mg total) rectally daily as needed for moderate constipation.  Dispense: 12 suppository; Refill: 0 - encourage hydration - encourage adding more fruits and vegetables to daily diet   Next appt: Visit date not found [place], [place] [phone]

## 2020-04-16 NOTE — Patient Instructions (Addendum)
Thyroid medication increased to 112 mcg PO daily (please take on empty stomach in morning)  We will recheck thyroid level in 6 weeks.   We will start senna for constipation- take one tablet on Tuesday/Thursday/Saturday - Stay hydrated and eat fruit, prunes or vegetables to help with bowel movement    Hypothyroidism  Hypothyroidism is when the thyroid gland does not make enough of certain hormones (it is underactive). The thyroid gland is a small gland located in the lower front part of the neck, just in front of the windpipe (trachea). This gland makes hormones that help control how the body uses food for energy (metabolism) as well as how the heart and brain function. These hormones also play a role in keeping your bones strong. When the thyroid is underactive, it produces too little of the hormones thyroxine (T4) and triiodothyronine (T3). What are the causes? This condition may be caused by:  Hashimoto's disease. This is a disease in which the body's disease-fighting system (immune system) attacks the thyroid gland. This is the most common cause.  Viral infections.  Pregnancy.  Certain medicines.  Birth defects.  Past radiation treatments to the head or neck for cancer.  Past treatment with radioactive iodine.  Past exposure to radiation in the environment.  Past surgical removal of part or all of the thyroid.  Problems with a gland in the center of the brain (pituitary gland).  Lack of enough iodine in the diet. What increases the risk? You are more likely to develop this condition if:  You are female.  You have a family history of thyroid conditions.  You use a medicine called lithium.  You take medicines that affect the immune system (immunosuppressants). What are the signs or symptoms? Symptoms of this condition include:  Feeling as though you have no energy (lethargy).  Not being able to tolerate cold.  Weight gain that is not explained by a change in diet  or exercise habits.  Lack of appetite.  Dry skin.  Coarse hair.  Menstrual irregularity.  Slowing of thought processes.  Constipation.  Sadness or depression. How is this diagnosed? This condition may be diagnosed based on:  Your symptoms, your medical history, and a physical exam.  Blood tests. You may also have imaging tests, such as an ultrasound or MRI. How is this treated? This condition is treated with medicine that replaces the thyroid hormones that your body does not make. After you begin treatment, it may take several weeks for symptoms to go away. Follow these instructions at home:  Take over-the-counter and prescription medicines only as told by your health care provider.  If you start taking any new medicines, tell your health care provider.  Keep all follow-up visits as told by your health care provider. This is important. ? As your condition improves, your dosage of thyroid hormone medicine may change. ? You will need to have blood tests regularly so that your health care provider can monitor your condition. Contact a health care provider if:  Your symptoms do not get better with treatment.  You are taking thyroid replacement medicine and you: ? Sweat a lot. ? Have tremors. ? Feel anxious. ? Lose weight rapidly. ? Cannot tolerate heat. ? Have emotional swings. ? Have diarrhea. ? Feel weak. Get help right away if you have:  Chest pain.  An irregular heartbeat.  A rapid heartbeat.  Difficulty breathing. Summary  Hypothyroidism is when the thyroid gland does not make enough of certain hormones (it is  underactive).  When the thyroid is underactive, it produces too little of the hormones thyroxine (T4) and triiodothyronine (T3).  The most common cause is Hashimoto's disease, a disease in which the body's disease-fighting system (immune system) attacks the thyroid gland. The condition can also be caused by viral infections, medicine, pregnancy, or  past radiation treatment to the head or neck.  Symptoms may include weight gain, dry skin, constipation, feeling as though you do not have energy, and not being able to tolerate cold.  This condition is treated with medicine to replace the thyroid hormones that your body does not make. This information is not intended to replace advice given to you by your health care provider. Make sure you discuss any questions you have with your health care provider. Document Revised: 03/26/2017 Document Reviewed: 03/24/2017 Elsevier Patient Education  2020 [employer].

## 2020-04-22 ENCOUNTER — Telehealth: Payer: Self-pay

## 2020-04-22 DIAGNOSIS — E1169 Type 2 diabetes mellitus with other specified complication: Secondary | ICD-10-CM

## 2020-04-22 DIAGNOSIS — E785 Hyperlipidemia, unspecified: Secondary | ICD-10-CM

## 2020-04-22 NOTE — Telephone Encounter (Signed)
Received fax from [place] stating the medication Joy Ward has been APPROVED from 01/23/2020-04/22/2021 Member ID: [id number]  Is this ok to add to patient's current medication list and send to pharmacy. If so, should patient discontinue Pravastatin. Please Advise.

## 2020-04-22 NOTE — Telephone Encounter (Signed)
Prior Authorization for medication "Livalo 4mg " sent into [employer]. Patient medication was discontinued because it states she's not taking on 04/30/2019. Waiting for reply from [employer]. I called patient daughter to confirm this. She states that her mother never started this medication because she needed PA. Daughter states that if this medication is approved she wants her to start taking it because patient doesn't like her current cholesterol medication. Patent is already on "Pravastatin".  06/28/2019 KeyLeward Quan - PA Case ID: [id number].

## 2020-04-22 NOTE — Telephone Encounter (Signed)
Will fwd to PCP to make her aware when she returns.

## 2020-04-23 MED ORDER — LIVALO 4 MG PO TABS
1.0000 | ORAL_TABLET | Freq: Every day | ORAL | 3 refills | Status: DC
Start: 2020-04-23 — End: 2020-05-29

## 2020-04-23 NOTE — Telephone Encounter (Signed)
Her facility will also need a copy of the orders.

## 2020-04-23 NOTE — Telephone Encounter (Signed)
D/c Pravastatin.  Start livalo as approved.  I was not involved in the recent discussion about this.  Pt saw NP.

## 2020-04-23 NOTE — Telephone Encounter (Signed)
Faxed to [employer].

## 2020-04-23 NOTE — Telephone Encounter (Signed)
Medication list updated. Called and left message on Voicemail with Dr. Ernest Mallick instructions.  Pended medication and sent to Dr. Renato Gails for approval due to HIGH ALERT Warning.

## 2020-05-01 ENCOUNTER — Other Ambulatory Visit: Payer: Self-pay | Admitting: Internal Medicine

## 2020-05-03 NOTE — Telephone Encounter (Signed)
rx sent to pharmacy by e-script  

## 2020-05-06 ENCOUNTER — Other Ambulatory Visit: Payer: Self-pay | Admitting: Internal Medicine

## 2020-05-06 DIAGNOSIS — I10 Essential (primary) hypertension: Secondary | ICD-10-CM

## 2020-05-06 DIAGNOSIS — R600 Localized edema: Secondary | ICD-10-CM

## 2020-05-07 ENCOUNTER — Telehealth: Payer: Self-pay

## 2020-05-07 DIAGNOSIS — Z20828 Contact with and (suspected) exposure to other viral communicable diseases: Secondary | ICD-10-CM | POA: Diagnosis not present

## 2020-05-07 NOTE — Telephone Encounter (Signed)
Patients daughter called again about the order for the UA for her mother

## 2020-05-07 NOTE — Telephone Encounter (Signed)
What is the reason for the UA c+s?

## 2020-05-07 NOTE — Telephone Encounter (Signed)
Patients daughter called to request if you could give a order for patient to be tested at her facility for a UA as they don't wish to bring her out of facility she said they have someone there right now that can do the UA  Please Advise

## 2020-05-08 ENCOUNTER — Encounter: Payer: Self-pay | Admitting: Orthopedic Surgery

## 2020-05-08 ENCOUNTER — Telehealth: Payer: Self-pay | Admitting: Internal Medicine

## 2020-05-08 ENCOUNTER — Encounter: Payer: Self-pay | Admitting: Internal Medicine

## 2020-05-08 NOTE — Telephone Encounter (Signed)
Daughter say the patient is having Hallucinations and behavior problems.

## 2020-05-08 NOTE — Telephone Encounter (Signed)
[name] called and asked if we could fax a note to [facility] stating that Joy Ward needed to keep her Voltaren cream in her room with her.   [phone]   Also wanted you to send an order to her facility for her to get a urine test, patient is hallucinating and urinating a lot  I will call [name] back [phone]

## 2020-05-08 NOTE — Telephone Encounter (Signed)
Ok, UA c+s at facility.  Fax order to them.

## 2020-05-08 NOTE — Telephone Encounter (Signed)
Both of these were previously addressed.

## 2020-05-09 ENCOUNTER — Telehealth: Payer: Self-pay

## 2020-05-09 NOTE — Telephone Encounter (Signed)
Cindy from [employer] called today to ask for an order for a UA for patient I filled the order out with Chrae's help and placed on Dr. Magdalene Molly Desk for her to sign they also asked about a letter  stating that the patient could keep her Voltaren Gel at bed side I and Chrae tried to find a copy of this letter but could not and I received a message now that Windell Moulding NP was approached about this same letter and she does not think it to be a good Idea I will inform Dr. Mariea Clonts so she and [name] can discuss this

## 2020-05-10 ENCOUNTER — Telehealth: Payer: Self-pay

## 2020-05-10 ENCOUNTER — Telehealth: Payer: Self-pay | Admitting: *Deleted

## 2020-05-10 MED ORDER — CEPHALEXIN 500 MG PO CAPS: 500.0000 mg | ORAL_CAPSULE | Freq: Two times a day (BID) | ORAL | 0 refills | Status: DC

## 2020-05-10 NOTE — Telephone Encounter (Signed)
Cindy from [employer] called to request a copy of the order for the antibiotic that was sent to pharmacy today for patient copy of medication script sent

## 2020-05-10 NOTE — Telephone Encounter (Signed)
Joy Ward with [facility] called requesting a Preventive Antibiotic to be called in for UTI. Stated that they cannot collect a Urine until Wednesday for patient because that is when their lab comes to facility.   Patient is having Hallucinations and Frequency, No Fever. Daughter told nurse that she knows patient has UTI and wants an antibiotic sent to pharmacy before this weather comes in. Nurse stated that she will collect urine Wednesday.   Please Advise.   Pharmacy: [facility].

## 2020-05-10 NOTE — Telephone Encounter (Signed)
Cindy with [employer] and agreed. Rx sent to Pharmacy.

## 2020-05-10 NOTE — Telephone Encounter (Signed)
Push fluids and Keflex 500mg  po bid for 7 days for possible UTI.

## 2020-05-15 DIAGNOSIS — Z03818 Encounter for observation for suspected exposure to other biological agents ruled out: Secondary | ICD-10-CM | POA: Diagnosis not present

## 2020-05-16 DIAGNOSIS — N390 Urinary tract infection, site not specified: Secondary | ICD-10-CM | POA: Diagnosis not present

## 2020-05-18 ENCOUNTER — Encounter (HOSPITAL_COMMUNITY)
Admission: EM | Disposition: A | Discharge status: Home / Self Care | Payer: Self-pay | Source: Home / Self Care | Attending: Emergency Medicine

## 2020-05-18 ENCOUNTER — Other Ambulatory Visit: Payer: Self-pay

## 2020-05-18 ENCOUNTER — Encounter (HOSPITAL_COMMUNITY): Payer: Self-pay

## 2020-05-18 ENCOUNTER — Encounter (HOSPITAL_COMMUNITY): Payer: Self-pay | Admitting: Certified Registered"

## 2020-05-18 ENCOUNTER — Emergency Department (HOSPITAL_COMMUNITY)
Admission: EM | Admit: 2020-05-18 | Discharge: 2020-05-19 | Disposition: A | Discharge status: Home / Self Care | Payer: Medicare Other | Attending: Emergency Medicine | Admitting: Emergency Medicine

## 2020-05-18 DIAGNOSIS — E039 Hypothyroidism, unspecified: Secondary | ICD-10-CM | POA: Insufficient documentation

## 2020-05-18 DIAGNOSIS — X58XXXA Exposure to other specified factors, initial encounter: Secondary | ICD-10-CM | POA: Insufficient documentation

## 2020-05-18 DIAGNOSIS — Z20822 Contact with and (suspected) exposure to covid-19: Secondary | ICD-10-CM | POA: Insufficient documentation

## 2020-05-18 DIAGNOSIS — R1111 Vomiting without nausea: Secondary | ICD-10-CM | POA: Diagnosis not present

## 2020-05-18 DIAGNOSIS — Z7984 Long term (current) use of oral hypoglycemic drugs: Secondary | ICD-10-CM | POA: Insufficient documentation

## 2020-05-18 DIAGNOSIS — F0390 Unspecified dementia without behavioral disturbance: Secondary | ICD-10-CM | POA: Insufficient documentation

## 2020-05-18 DIAGNOSIS — E1142 Type 2 diabetes mellitus with diabetic polyneuropathy: Secondary | ICD-10-CM | POA: Insufficient documentation

## 2020-05-18 DIAGNOSIS — I10 Essential (primary) hypertension: Secondary | ICD-10-CM | POA: Insufficient documentation

## 2020-05-18 DIAGNOSIS — T18128A Food in esophagus causing other injury, initial encounter: Secondary | ICD-10-CM | POA: Diagnosis not present

## 2020-05-18 DIAGNOSIS — Z79899 Other long term (current) drug therapy: Secondary | ICD-10-CM | POA: Diagnosis not present

## 2020-05-18 DIAGNOSIS — Z87891 Personal history of nicotine dependence: Secondary | ICD-10-CM | POA: Insufficient documentation

## 2020-05-18 DIAGNOSIS — R1013 Epigastric pain: Secondary | ICD-10-CM | POA: Diagnosis not present

## 2020-05-18 DIAGNOSIS — R070 Pain in throat: Secondary | ICD-10-CM | POA: Diagnosis not present

## 2020-05-18 DIAGNOSIS — R52 Pain, unspecified: Secondary | ICD-10-CM | POA: Diagnosis not present

## 2020-05-18 LAB — SARS CORONAVIRUS 2 BY RT PCR (HOSPITAL ORDER, PERFORMED IN ~~LOC~~ HOSPITAL LAB): SARS Coronavirus 2: NEGATIVE

## 2020-05-18 SURGERY — CANCELLED PROCEDURE

## 2020-05-18 MED ORDER — OXYCODONE-ACETAMINOPHEN 5-325 MG PO TABS
1.0000 | ORAL_TABLET | Freq: Once | ORAL | Status: AC
Start: 2020-05-18 — End: 2020-05-18
  Administered 2020-05-18: 1 via ORAL
  Filled 2020-05-18: qty 1

## 2020-05-18 MED ORDER — PROPOFOL 10 MG/ML IV BOLUS
INTRAVENOUS | Status: AC
  Filled 2020-05-18: qty 20

## 2020-05-18 MED ORDER — GLUCAGON HCL RDNA (DIAGNOSTIC) 1 MG IJ SOLR
1.0000 mg | Freq: Once | INTRAMUSCULAR | Status: AC
  Administered 2020-05-18: 1 mg via INTRAVENOUS
  Filled 2020-05-18: qty 1

## 2020-05-18 MED ORDER — ACETAMINOPHEN 325 MG PO TABS
650.0000 mg | ORAL_TABLET | Freq: Once | ORAL | Status: DC
  Filled 2020-05-18: qty 2

## 2020-05-18 MED ORDER — ONDANSETRON HCL 4 MG/2ML IJ SOLN
4.0000 mg | Freq: Once | INTRAMUSCULAR | Status: AC
  Administered 2020-05-18: 4 mg via INTRAVENOUS
  Filled 2020-05-18: qty 2

## 2020-05-18 SURGICAL SUPPLY — 15 items

## 2020-05-18 NOTE — ED Provider Notes (Addendum)
Medical screening examination/treatment/procedure(s) were conducted as a shared visit with non-physician practitioner(s) and myself.  I personally evaluated the patient during the encounter.    80 year old female who presents with concern for esophageal obstruction.  Was eating a piece of pork and felt to get stuck.  Cannot control her secretions.  She is able to maintain her airway.  Will consult GI  7:52 PM Patient given glucagon and canal swallow liquids appropriately.  No emesis noted here.  Will discharge and placed on liquid diet   Lacretia Leigh, MD 05/18/20 1551    Lacretia Leigh, MD 05/18/20 [phone]

## 2020-05-18 NOTE — ED Notes (Signed)
Patient given another ginger ale. 

## 2020-05-18 NOTE — Anesthesia Preprocedure Evaluation (Deleted)
Anesthesia Evaluation  Patient identified by MRN, date of birth, ID band Patient awake    Reviewed: Allergy & Precautions, H&P , NPO status , Patient's Chart, lab work & pertinent test results  Airway        Dental no notable dental hx.    Pulmonary neg pulmonary ROS, former smoker,    Pulmonary exam normal        Cardiovascular hypertension, Pt. on medications      Neuro/Psych Dementia CVA, No Residual Symptoms    GI/Hepatic negative GI ROS, Neg liver ROS,   Endo/Other  diabetes, Type 2, Oral Hypoglycemic AgentsHypothyroidism   Renal/GU negative Renal ROS  negative genitourinary   Musculoskeletal  (+) Arthritis , Osteoarthritis,    Abdominal   Peds  Hematology  (+) Blood dyscrasia, anemia ,   Anesthesia Other Findings   Reproductive/Obstetrics negative OB ROS                             Anesthesia Physical Anesthesia Plan  ASA: III  Anesthesia Plan: General   Post-op Pain Management:    Induction: Intravenous, Rapid sequence and Cricoid pressure planned  PONV Risk Score and Plan: 3 and Ondansetron and Treatment may vary due to age or medical condition  Airway Management Planned: Oral ETT  Additional Equipment:   Intra-op Plan:   Post-operative Plan: Extubation in OR  Informed Consent: I have reviewed the patients History and Physical, chart, labs and discussed the procedure including the risks, benefits and alternatives for the proposed anesthesia with the patient or authorized representative who has indicated his/her understanding and acceptance.     Dental advisory given  Plan Discussed with: CRNA  Anesthesia Plan Comments:         Anesthesia Quick Evaluation

## 2020-05-18 NOTE — ED Notes (Signed)
Patient unable to swallow with [place], [place]. Waiting for endoscopy from [facility] to come for procedure.

## 2020-05-18 NOTE — ED Provider Notes (Signed)
[facility] DEPT Provider Note   CSN: [id number] Arrival date & time: 05/18/20  1500     History No chief complaint on file.   Joy Ward is a 80 y.o. female with a history of vascular dementia, diabetes type 2, hypertension.  Patient was brought to emergency room and by ambulance from [facility].  Patient presents with a chief complaint of esophageal food impaction.  This occurred around 13:20, patient states she feels like food is stuck in her throat, patient reports difficulty swallowing, no alleviating or aggravating factors.    Patient reports that she was eating pork and felt it get stuck in her throat.  Patient reports at present she feels a "lump and my throat."  Patient also endorses epigastric pain, describes it as a "burning," rates pain 5/10 on the pain scale, no alleviating or aggravating factors, no radiation of pain.  Patient denies any difficulty breathing, shortness of breath, or cough.  Patient denies any previous episodes of difficulty swallowing  Spoke to med tech Katie at [facility] at 13:20 patient came out of her room stating that she was choking.  Patient was able to speak without difficulty.  The Heimlich maneuver was performed.  Patient spit up a small amount of food.  Patient continued to spit  Up saliva with small amounts of meat mixed in.  Patient was given fluids to drink but spit the fluids out stating she could not swallow them.    Medical records were reviewed and patient's baseline mental status alert to self, person, and place.    The history is provided by the patient, the nursing home and medical records.       Past Medical History:  Diagnosis Date  . Allergy   . Arthritis   . Chronic back pain   . DDD (degenerative disc disease), lumbar   . Diabetes mellitus without complication ([place])   . Diabetes type 2, uncontrolled ([place]) 09/18/2006   Annotation: Noninsulin dependent Qualifier: Diagnosis of  By: Amil Amen  MD, Benjamine Mola    . Former tobacco use   . Hyperlipidemia    a. H/o muscle aches with statins per PCP notes.  . Hypertension   . Hypothyroidism    a. prior thyroid removal.  . Memory loss or impairment 03/29/2019  . Stroke [facility])    a. Listed in PCP notes: "History of stroke:  History of terrible headache.  Had a CT scan of brain and found "ministroke"  Was removed from ERT subsequently. "    Patient Active Problem List   Diagnosis Date Noted  . Ataxia 04/07/2020  . Weakness generalized 04/07/2020  . Weakness 04/07/2020  . Carpal tunnel syndrome of left wrist 02/20/2020  . Diabetic polyneuropathy associated with type 2 diabetes mellitus ([employer]) 02/20/2020  . Lower GI bleed 07/31/2019  . Syncope 07/30/2019  . Pancytopenia ([place]) 07/30/2019  . Hyperchloremia 07/30/2019  . Metabolic acidosis 59/56/3875  . Hypocalcemia 07/30/2019  . Palliative care patient 07/03/2019  . Hallucinations 06/24/2019  . Metabolic encephalopathy 64/33/2951  . Altered mental status   . Recurrent UTI 04/30/2019  . Vascular dementia with behavior disturbance ([place]) 04/14/2019  . Primary insomnia 04/14/2019  . Hypothyroidism 04/14/2019  . Diabetes mellitus ([place]) 04/14/2019  . Memory loss or impairment 03/29/2019  . Diabetic peripheral neuropathy associated with type 2 diabetes mellitus ([place]) 12/29/2017  . Peripheral edema 12/29/2017  . Elevated liver enzymes 10/15/2016  . Chest pain 04/22/2016  . Systolic murmur 88/41/6606  . Hypercalcemia 04/22/2016  .  Hyperlipidemia 04/22/2016  . Baker's cyst of knee, right 04/21/2016  . HYPOKALEMIA 01/24/2010  . CONSTIPATION 12/23/2009  . INTERMITTENT VERTIGO 12/23/2009  . SINUSITIS, ACUTE 04/25/2009  . VAGINITIS, CANDIDAL 02/14/2009  . HIP PAIN, RIGHT 02/14/2009  . HYPERLIPIDEMIA, MIXED 01/01/2009  . BACK PAIN, LUMBAR, WITH RADICULOPATHY 12/25/2008  . KNEE PAIN, BILATERAL 12/18/2008  . TROCHANTERIC BURSITIS, BILATERAL 12/18/2008  . Disturbance in sleep behavior  12/18/2008  . ONYCHOMYCOSIS, TOENAILS 10/30/2008  . ENTERITIS, CLOSTRIDIUM DIFFICILE 10/15/2006  . Diabetes type 2, uncontrolled ([place]) 09/18/2006  . Essential hypertension 09/18/2006  . Allergic rhinitis 09/18/2006  . GOITER, MULTINODULAR 06/29/2006  . STROKE 04/27/2001    Past Surgical History:  Procedure Laterality Date  . ABDOMINAL HYSTERECTOMY  1994   TAH, not clear if unilateral oophorectomy as well.  [facility] [place]  . CHOLECYSTECTOMY  1973   open  . DECOMPRESSIVE LUMBAR LAMINECTOMY LEVEL 4  2005   In [place]  . THYROIDECTOMY  2012   multinodular goiter     OB History   No obstetric history on file.     Family History  Problem Relation Age of Onset  . Heart disease Mother        unclear details "atherosclerosis"  . Diabetes Mother   . Alcohol abuse Sister   . Diabetes Brother   . Diabetes Brother   . Drug abuse Brother   . Diabetes Daughter   . Heart disease Son        CHF  . Alcohol abuse Son     Social History   Tobacco Use  . Smoking status: Former Research scientist (life sciences)  . Smokeless tobacco: Never Used  . Tobacco comment: No smoking since 80 yo - smoked 10 yrs  Vaping Use  . Vaping Use: Never used  Substance Use Topics  . Alcohol use: No  . Drug use: No    Home Medications Prior to Admission medications   Medication Sig Start Date End Date Taking? Authorizing Provider  acetaminophen (TYLENOL) 325 MG tablet Take 650 mg by mouth every 6 (six) hours as needed for mild pain (fever greater than or equal to 101 degrees).    [provider]  aluminum-magnesium hydroxide-simethicone (MAALOX) I037812 MG/5ML SUSP Take 30 mLs by mouth every 6 (six) hours as needed (heartburn/indigestion).    [provider]  amLODipine (NORVASC) 5 MG tablet TAKE 1 TABLET BY MOUTH EVERY DAY 05/01/20   Reed, Tiffany L, DO  bisacodyl (DULCOLAX) 10 MG suppository Place 1 suppository (10 mg total) rectally daily as needed for moderate constipation.  04/16/20   Yvonna Alanis, NP  Calcium-Vitamin D-Vitamin K (VIACTIV CALCIUM PLUS D) 650-12.5-40 MG-MCG-MCG CHEW Chew 1 tablet by mouth daily.    [provider]  cephALEXin (KEFLEX) 500 MG capsule Take 1 capsule (500 mg total) by mouth 2 (two) times daily. For 7 days for possible UTI 05/10/20   Mariea Clonts, Tiffany L, DO  diclofenac Sodium (VOLTAREN) 1 % GEL Apply 4 g topically 4 (four) times daily. To lower back, left hand, and fingers    [provider]  donepezil (ARICEPT) 5 MG tablet TAKE 1 TABLET BY MOUTH AT BEDTIME 05/01/20   Reed, Tiffany L, DO  furosemide (LASIX) 20 MG tablet TAKE 2 TABLETS BY MOUTH EVERY DAY 04/08/20   Reed, Tiffany L, DO  guaifenesin (ROBITUSSIN) 100 MG/5ML syrup Take 200 mg by mouth every 6 (six) hours as needed for cough.    [provider]  HYDROcodone-acetaminophen (NORCO) 10-325 MG  tablet Take 1 tablet by mouth See admin instructions. Take one tablet by mouth daily at 9am, 1pm, 5pm and 9pm    [provider]  levothyroxine (SYNTHROID) 112 MCG tablet Take 1 tablet (112 mcg total) by mouth daily. 04/16/20   Fargo, [name] E, NP  loperamide (IMODIUM) 2 MG capsule Take 2 mg by mouth as needed for diarrhea or loose stools (do not exceed 8 doses in 24 hours).    [provider]  loratadine (CLARITIN) 10 MG tablet Take 1 tablet (10 mg total) by mouth daily. 10/20/19   Ngetich, Dinah C, NP  magnesium hydroxide (MILK OF MAGNESIA) 400 MG/5ML suspension Take 30 mLs by mouth at bedtime as needed (constipation). 08/01/19   Matcha, Beverely Pace, MD  metFORMIN (GLUCOPHAGE) 500 MG tablet Take 500 mg by mouth daily. 9:00 am    [provider]  Neomycin-Bacitracin-Polymyxin (TRIPLE ANTIBIOTIC) 3.5-2037522751 OINT Apply 1 application topically as needed (minor skin tears/abrasions).    [provider]  Omega-3 Fatty Acids (FISH OIL) 1000 MG CAPS Take 1,000 mg by mouth daily.     [provider]  Pitavastatin Calcium (LIVALO) 4 MG TABS Take 1  tablet (4 mg total) by mouth daily. 04/23/20   Reed, Tiffany L, DO  polyethylene glycol (MIRALAX / GLYCOLAX) 17 g packet Take 17 g by mouth daily. 09/06/19   Ngetich, Dinah C, NP  potassium chloride SA (KLOR-CON) 20 MEQ tablet TAKE 1 TABLET BY MOUTH EVERY DAY 02/21/20   Reed, Tiffany L, DO  QUEtiapine (SEROQUEL) 100 MG tablet TAKE 1 TABLET BY MOUTH NIGHTLY AT BEDTIME 03/28/20   Reed, Tiffany L, DO  senna (SENOKOT) 8.6 MG TABS tablet Take 1 tablet (8.6 mg total) by mouth every Tuesday, Thursday, and Saturday at 6 PM. 04/16/20   Fargo, [name] E, NP  traZODone (DESYREL) 100 MG tablet TAKE 1 TABLET BY MOUTH AT BEDTIME AS NEEDED FOR SLEEP 02/21/20   Reed, Tiffany L, DO    Allergies    Clindamycin hcl, Sulfamethoxazole-trimethoprim, Zanaflex [tizanidine hcl], Metronidazole, Statins, Bee venom, Ace inhibitors, and Adhesive [tape]  Review of Systems   Review of Systems  HENT: Positive for trouble swallowing.   Respiratory: Positive for choking. Negative for cough and shortness of breath.   Gastrointestinal: Positive for abdominal pain (epigastric), nausea and vomiting.  Neurological: Negative for syncope.    Physical Exam Updated Vital Signs BP (!) 154/89 (BP Location: Left Arm)   Pulse 85   Temp 98.7 F (37.1 C) (Oral)   Resp 20   SpO2 96%   Physical Exam Vitals and nursing note reviewed.  Constitutional:      General: She is not in acute distress.    Appearance: She is not ill-appearing, toxic-appearing or diaphoretic.  HENT:     Head: Normocephalic and atraumatic.     Mouth/Throat:     Mouth: Mucous membranes are moist.     Pharynx: Oropharynx is clear. Uvula midline. No pharyngeal swelling, oropharyngeal exudate, posterior oropharyngeal erythema or uvula swelling.  Eyes:     General: No scleral icterus.       Right eye: No discharge.        Left eye: No discharge.  Cardiovascular:     Rate and Rhythm: Normal rate.  Pulmonary:     Effort: Pulmonary effort is normal. No respiratory  distress.     Breath sounds: Normal breath sounds. No stridor. No wheezing, rhonchi or rales.  Chest:     Chest wall: No tenderness.  Abdominal:  General: There is no distension.     Palpations: There is no mass or pulsatile mass.     Tenderness: There is abdominal tenderness in the epigastric area.  Musculoskeletal:     Cervical back: Neck supple.  Skin:    General: Skin is warm and dry.  Neurological:     General: No focal deficit present.     Mental Status: She is alert.     Comments: Patient was found to be alert to person and place  Psychiatric:        Behavior: Behavior is cooperative.     ED Results / Procedures / Treatments   Labs (all labs ordered are listed, but only abnormal results are displayed) Labs Reviewed  SARS CORONAVIRUS 2 BY RT PCR (HOSPITAL ORDER, [place] [facility])    EKG None  Radiology No results found.  Procedures Procedures (including critical care time)  Medications Ordered in ED Medications  ondansetron (ZOFRAN) injection 4 mg (4 mg Intravenous Given 05/18/20 1604)    ED Course  I have reviewed the triage vital signs and the nursing notes.  Pertinent labs & imaging results that were available during my care of the patient were reviewed by me and considered in my medical decision making (see chart for details).    MDM Rules/Calculators/A&P                          Alert 79 year old female, in no acute distress, nontoxic appearing.  Patient presents with a chief complaint of esophageal food impaction after eating pork.  Patient states feels like there is "a lump in my throat."  Spoke to med tech at [facility] reports that patient came back from room at 13:20 complaining that she was choking, patient was able to speak without difficulties at that time.    Patient has patent airway, speaking in full complete sentences without difficulty, denies any shortness of breath.  Lungs clear to auscultation bilaterally, no  stridor noted.  Patient observed to have some difficulties with oral secretions.  She was noted to have an episode of vomiting frequently spitting saliva.  Patient was given water to drink and was unable to swallow spitting the water out.  GI was consulted as patient will likely need EGD.   On serial reexaminations patient continues to have patent airway, speaking in full complete sentences without difficulty.  Patient continues to have difficultly maintaining oral secretions with frequent spitting of saliva.    16:25 Spoke with Dr. Paulita Fujita who recommended trying glucagon to alleviate patient's food impaction.  Will administer glucagon and reassess patient.    17:15 patient continues to complain of sensation of food stuck in her esophagus. Patient continues to have difficulty controlling oral secretions with frequent spitting of saliva. Patient was unable to tolerate p.o. fluid challenge. We will reach out to gastroenterology.    17:30 Spoke to Dr. Paulita Fujita who will arrange procedure for patient.    17:40 Spoke to patient's daughter [name] and gave her an update on patient's status.    On reexamination patient no longer complains of pain or "lump," in her throat.  Patient able to handle her secretions without difficulty.  Patient able to swallow water without difficulty or complaints of pain.  Due to resolution of patient's symptoms we will discharge patient back to care facility on a clear liquid diet for the next 24-48 hours.    20:06 spoke with patient's daughter [name] informed her  of d/c.    Patient was discussed with and evaluated by Dr. Zenia Resides.    Final Clinical Impression(s) / ED Diagnoses Final diagnoses:  Food impaction of esophagus, initial encounter    Rx / DC Orders ED Discharge Orders    None       Dyann Ruddle 05/19/20 0236    Lacretia Leigh, MD 05/19/20 2249

## 2020-05-18 NOTE — ED Notes (Addendum)
Called patient's daughter and updated her on patient's status 

## 2020-05-18 NOTE — ED Notes (Signed)
[facility] called and aware of patients discharge back to facility.

## 2020-05-18 NOTE — ED Notes (Signed)
[facility] called for transport.  

## 2020-05-18 NOTE — ED Notes (Signed)
Called pts daughter, [name], and gave her update on pts status

## 2020-05-18 NOTE — ED Triage Notes (Signed)
Per EMS- patient is from the [facility]. Patient got chocked on foot at the nursing home. Staff did the Heimlich maneuver. Patient is talking and sats are 100 %, but is coughing, trying to move the object.

## 2020-05-18 NOTE — ED Notes (Signed)
Patient given sandwich and drink, waiting for [facility].

## 2020-05-18 NOTE — ED Notes (Signed)
Patient refusing ordered Tylenol for pain that was ordered from PA.

## 2020-05-18 NOTE — ED Notes (Signed)
Patient requesting pain medicine, PA Peter notified. Awaiting orders.

## 2020-05-18 NOTE — ED Notes (Signed)
Patient given warm blanket, patient asked for new purewick since the one she had on was "contaminated on the side of the bed." RN put new purewick on patient. Endoscopy called and was coming to get patient for procedure. Patient able to swallow water and will be discharged.

## 2020-05-18 NOTE — Discharge Instructions (Addendum)
Use a liquid diet for the next 24 to 48 hours

## 2020-05-19 DIAGNOSIS — R4182 Altered mental status, unspecified: Secondary | ICD-10-CM | POA: Diagnosis not present

## 2020-05-19 DIAGNOSIS — Z7401 Bed confinement status: Secondary | ICD-10-CM | POA: Diagnosis not present

## 2020-05-19 DIAGNOSIS — M2550 Pain in unspecified joint: Secondary | ICD-10-CM | POA: Diagnosis not present

## 2020-05-20 ENCOUNTER — Other Ambulatory Visit: Payer: Self-pay | Admitting: Internal Medicine

## 2020-05-21 ENCOUNTER — Telehealth: Payer: Self-pay | Admitting: Neurology

## 2020-05-21 NOTE — Telephone Encounter (Signed)
Pt's hands burn , daughter would like to know what could be called in for pt

## 2020-05-22 DIAGNOSIS — Z20828 Contact with and (suspected) exposure to other viral communicable diseases: Secondary | ICD-10-CM | POA: Diagnosis not present

## 2020-05-22 NOTE — Telephone Encounter (Signed)
Patient's daughter has called back in to inquire if the wrist splints were a prescription.  Advised the patient that it is an over-the-counter item.  Informed her that I am unaware how the assisted living facility goes about ordering these splints.  Informed that they can be purchased at [employer]/[facility].  Informed the daughter that should be worn throughout the day and can take a break at night and they should be on each wrist.  Informed her to make sure it has a splint in the brace to help support the wrist.  Patient's daughter verbalized understanding and had no further questions at this time.  Advised her to contact us back if there is any other issues or concerns.

## 2020-05-22 NOTE — Telephone Encounter (Signed)
Called the daughter back to ask if the patient has been wearing the bilateral wrist splints for carpal tunnel syndrome.  Patient is in an assisted living facility and daughter states this is the first time she has ever heard that this was an issue.  Advised that the facility is given a treatment plan at the visit.  It would be the facilities responsibility to update the family members on patient's care.  Patient's daughter indicated the care at the facility is not the best and they are hoping to get her moved into a different area.  She is at a loss as to what she can do to help ease the patient's discomfort in her hands.  Advised the daughter I would recommend she contact the facility to see if the patient is in fact wearing the bilateral wrist splints.  If they have been placing the wrist splints on and she still proceeds to have this discomfort then the next step would be to refer to a hand surgeon.  The daughter was hesitant about surgery I informed her that the referral to hand surgeon does not necessarily mean surgery would be the treatment option but they would be the ones to provide treatment for the carpal tunnel if injections were needed or what ever options they decided.  Advised the daughter the first step would be is finding out if she has been wearing the wrist splint.  Patient's daughter asked if we could go ahead and place the referral to hand surgery, I advised that we could but the same treatment would apply in regards to the patient needing to attempt the wrist splints first.  Advised the same recommendation will be made from a hand surgeon before they would do any other intervention.  The daughter verbalized understanding and will contact the facility to find out.

## 2020-05-28 ENCOUNTER — Other Ambulatory Visit: Payer: Medicare Other

## 2020-05-29 ENCOUNTER — Ambulatory Visit (INDEPENDENT_AMBULATORY_CARE_PROVIDER_SITE_OTHER): Payer: Medicare Other | Admitting: Family

## 2020-05-29 ENCOUNTER — Other Ambulatory Visit: Payer: Self-pay

## 2020-05-29 ENCOUNTER — Other Ambulatory Visit: Payer: Medicare Other

## 2020-05-29 ENCOUNTER — Encounter: Payer: Self-pay | Admitting: Family

## 2020-05-29 VITALS — BP 130/70 | HR 80 | Temp 97.7°F | Resp 16 | Ht 64.0 in | Wt 156.4 lb

## 2020-05-29 DIAGNOSIS — M150 Primary generalized (osteo)arthritis: Secondary | ICD-10-CM

## 2020-05-29 DIAGNOSIS — M545 Low back pain, unspecified: Secondary | ICD-10-CM

## 2020-05-29 DIAGNOSIS — E038 Other specified hypothyroidism: Secondary | ICD-10-CM

## 2020-05-29 DIAGNOSIS — M159 Polyosteoarthritis, unspecified: Secondary | ICD-10-CM

## 2020-05-29 DIAGNOSIS — M8949 Other hypertrophic osteoarthropathy, multiple sites: Secondary | ICD-10-CM

## 2020-05-29 DIAGNOSIS — G8929 Other chronic pain: Secondary | ICD-10-CM

## 2020-05-29 MED ORDER — DICLOFENAC SODIUM 1 % EX GEL: 4.0000 g | Freq: Three times a day (TID) | CUTANEOUS | 3 refills | Status: DC

## 2020-05-29 NOTE — Progress Notes (Signed)
Provider: Anarely Nicholls FNP-C  Gayland Curry, DO  Patient Care Team: Gayland Curry, DO as PCP - General (Geriatric Medicine)  Extended Emergency Contact Information Primary Emergency Contact: [name] of [place] Phone: [phone] Mobile Phone: [phone] Relation: Daughter Secondary Emergency Contact: PETERS,ANGELA Mobile Phone: [phone] Relation: Daughter  Code Status: Full Code  Goals of care: Advanced Directive information Advanced Directives 05/29/2020  Does Patient Have a Medical Advance Directive? No  Type of Advance Directive -  Does patient want to make changes to medical advance directive? -  Copy of [place] in Chart? -  Would patient like information on creating a medical advance directive? No - Patient declined     Chief Complaint  Patient presents with  . Acute Visit    Complains of not feeling well.    HPI:  Pt is a 80 y.o. female seen today for an acute visit for evaluation of not feeling well.she states was here to get labs done and told the secretary not feeling well and was added to see provider. She states met " Her old bones are ache" especially the back pain,shoulders,hips and hands.she uses Voltaren gel and takes Hydrocodone which has been helpful.she request refills for Voltaren gel.    Past Medical History:  Diagnosis Date  . Allergy   . Arthritis   . Chronic back pain   . DDD (degenerative disc disease), lumbar   . Diabetes mellitus without complication ([place])   . Diabetes type 2, uncontrolled ([facility]) 09/18/2006   Annotation: Noninsulin dependent Qualifier: Diagnosis of  By: Amil Amen MD, Benjamine Mola    . Former tobacco use   . Hyperlipidemia    a. H/o muscle aches with statins per PCP notes.  . Hypertension   . Hypothyroidism    a. prior thyroid removal.  . Memory loss or impairment 03/29/2019  . Stroke [facility])    a. Listed in PCP notes: "History of stroke:  History of terrible headache.   Had a CT scan of brain and found "ministroke"  Was removed from ERT subsequently. "   Past Surgical History:  Procedure Laterality Date  . ABDOMINAL HYSTERECTOMY  1994   TAH, not clear if unilateral oophorectomy as well.  Marland Kitchen [place]  . CHOLECYSTECTOMY  1973   open  . DECOMPRESSIVE LUMBAR LAMINECTOMY LEVEL 4  2005   In [place]  . THYROIDECTOMY  2012   multinodular goiter    Allergies  Allergen Reactions  . Clindamycin Hcl Other (See Comments)    REACTION: C. difficile colitis  . Sulfamethoxazole-Trimethoprim Other (See Comments)     Eye redness  . Zanaflex [Tizanidine Hcl] Anaphylaxis  . Metronidazole Hives  . Statins Other (See Comments)    Side Effect: muscle pain Has tried Zocor, Crestor--she does not want to take a statin, period  . Bee Venom Other (See Comments)    Unknown reaction  . Ace Inhibitors Cough  . Adhesive [Tape] Itching, Rash and Other (See Comments)    Blistering of skin     Outpatient Encounter Medications as of 05/29/2020  Medication Sig  . acetaminophen (TYLENOL) 325 MG tablet Take 650 mg by mouth every 6 (six) hours as needed for mild pain (fever greater than or equal to 101 degrees).  Marland Kitchen aluminum-magnesium hydroxide-simethicone (MAALOX) [phone] MG/5ML SUSP Take 30 mLs by mouth every 6 (six) hours as needed (heartburn/indigestion).  Marland Kitchen amLODipine (NORVASC) 5 MG tablet TAKE 1 TABLET BY MOUTH EVERY DAY  .  bisacodyl (DULCOLAX) 10 MG suppository Place 1 suppository (10 mg total) rectally daily as needed for moderate constipation.  . Calcium-Vitamin D-Vitamin K (VIACTIV CALCIUM PLUS D) 650-12.5-40 MG-MCG-MCG CHEW Chew 1 tablet by mouth daily.  . diclofenac Sodium (VOLTAREN) 1 % GEL Apply 4 g topically 3 (three) times daily. To lower back, left hand, and fingers  . donepezil (ARICEPT) 5 MG tablet TAKE 1 TABLET BY MOUTH AT BEDTIME  . furosemide (LASIX) 20 MG tablet TAKE 2 TABLETS BY MOUTH EVERY DAY  . guaifenesin (ROBITUSSIN) 100  MG/5ML syrup Take 200 mg by mouth every 6 (six) hours as needed for cough.  . HYDROcodone-acetaminophen (NORCO) 10-325 MG tablet Take 1 tablet by mouth See admin instructions. Take one tablet by mouth daily at 9am, 1pm, 5pm and 9pm  . levothyroxine (SYNTHROID) 112 MCG tablet Take 1 tablet (112 mcg total) by mouth daily.  . loperamide (IMODIUM) 2 MG capsule Take 2 mg by mouth as needed for diarrhea or loose stools (do not exceed 8 doses in 24 hours).  . loratadine (CLARITIN) 10 MG tablet Take 1 tablet (10 mg total) by mouth daily.  . magnesium hydroxide (MILK OF MAGNESIA) 400 MG/5ML suspension Take 30 mLs by mouth at bedtime as needed (constipation).  . metFORMIN (GLUCOPHAGE) 500 MG tablet TAKE 1 TABLET BY MOUTH EVERY DAY  . Neomycin-Bacitracin-Polymyxin (TRIPLE ANTIBIOTIC) 3.5-400-5000 OINT Apply 1 application topically as needed (minor skin tears/abrasions).  . Omega-3 Fatty Acids (FISH OIL) 1000 MG CAPS Take 1,000 mg by mouth daily.   . polyethylene glycol (MIRALAX / GLYCOLAX) 17 g packet Take 17 g by mouth daily.  . potassium chloride SA (KLOR-CON) 20 MEQ tablet TAKE 1 TABLET BY MOUTH EVERY DAY  . QUEtiapine (SEROQUEL) 100 MG tablet TAKE 1 TABLET BY MOUTH AT BEDTIME  . senna (SENOKOT) 8.6 MG TABS tablet Take 1 tablet (8.6 mg total) by mouth every Tuesday, Thursday, and Saturday at 6 PM.  . traZODone (DESYREL) 100 MG tablet TAKE 1 TABLET BY MOUTH AT BEDTIME AS NEEDED FOR SLEEP  . [DISCONTINUED] cephALEXin (KEFLEX) 500 MG capsule Take 1 capsule (500 mg total) by mouth 2 (two) times daily. For 7 days for possible UTI  . [DISCONTINUED] Pitavastatin Calcium (LIVALO) 4 MG TABS Take 1 tablet (4 mg total) by mouth daily. (Patient not taking: No sig reported)   No facility-administered encounter medications on file as of 05/29/2020.    Review of Systems  Constitutional: Negative for appetite change, chills, fatigue and fever.  HENT: Negative for congestion, rhinorrhea, sinus pressure, sinus pain,  sneezing and sore throat.   Eyes: Negative for discharge, redness and itching.  Respiratory: Negative for cough, chest tightness, shortness of breath and wheezing.   Cardiovascular: Negative for chest pain, palpitations and leg swelling.  Gastrointestinal: Positive for constipation. Negative for abdominal distention, abdominal pain, diarrhea, nausea and vomiting.       Stool softeners effective   Endocrine: Negative for cold intolerance, heat intolerance, polydipsia, polyphagia and polyuria.  Genitourinary: Negative for difficulty urinating, dysuria, flank pain, frequency and urgency.  Musculoskeletal: Negative for arthralgias, back pain, gait problem, joint swelling and myalgias.  Skin: Negative for color change, pallor and rash.  Neurological: Negative for dizziness, speech difficulty, weakness, light-headedness and headaches.  Psychiatric/Behavioral: Positive for confusion. Negative for agitation, behavioral problems and sleep disturbance. The patient is not nervous/anxious.     Immunization History  Administered Date(s) Administered  . Fluad Quad(high Dose 65+) 02/06/2019  . Influenza Whole 03/08/2009  . Influenza, High Dose   Seasonal PF 01/18/2017  . Influenza,inj,quad, With Preservative 01/25/2017  . Influenza-Unspecified 02/12/2016, 01/23/2017  . PFIZER(Purple Top)SARS-COV-2 Vaccination 06/10/2019, 07/05/2019, 01/23/2020  . PPD Test 06/21/2019  . Pneumococcal Conjugate-13 02/17/2019  . Pneumococcal Polysaccharide-23 04/25/2004, 05/29/2017  . Td 04/27/2003  . Zoster 04/27/2017   Pertinent  Health Maintenance Due  Topic Date Due  . FOOT EXAM  Never done  . OPHTHALMOLOGY EXAM  Never done  . URINE MICROALBUMIN  03/26/2018  . INFLUENZA VACCINE  11/26/2019  . HEMOGLOBIN A1C  03/08/2020  . DEXA SCAN  Completed  . PNA vac Low Risk Adult  Completed   Fall Risk  05/29/2020 04/16/2020 12/04/2019 11/23/2019 11/09/2019  Falls in the past year? 0 0 0 0 0  Number falls in past yr: 0 0 0 - 0   Injury with Fall? 0 0 0 - 0   Functional Status Survey:    Vitals:   05/29/20 1435  BP: 130/70  Pulse: 80  Resp: 16  Temp: 97.7 F (36.5 C)  SpO2: 98%  Weight: 156 lb 6.4 oz (70.9 kg)  Height: 5' 4" (1.626 m)   Body mass index is 26.85 kg/m. Physical Exam Vitals reviewed.  Constitutional:      General: She is not in acute distress.    Appearance: She is overweight. She is not ill-appearing.  HENT:     Head: Normocephalic.     Right Ear: Tympanic membrane, ear canal and external ear normal. There is no impacted cerumen.     Left Ear: Tympanic membrane, ear canal and external ear normal. There is no impacted cerumen.     Nose: Nose normal. No congestion or rhinorrhea.     Mouth/Throat:     Mouth: Mucous membranes are moist.     Pharynx: Oropharynx is clear. No oropharyngeal exudate or posterior oropharyngeal erythema.  Eyes:     General: No scleral icterus.       Right eye: No discharge.        Left eye: No discharge.     Extraocular Movements: Extraocular movements intact.     Conjunctiva/sclera: Conjunctivae normal.     Pupils: Pupils are equal, round, and reactive to light.  Cardiovascular:     Rate and Rhythm: Normal rate and regular rhythm.     Pulses: Normal pulses.     Heart sounds: Normal heart sounds. No murmur heard. No friction rub. No gallop.   Pulmonary:     Effort: Pulmonary effort is normal. No respiratory distress.     Breath sounds: Normal breath sounds. No wheezing, rhonchi or rales.  Chest:     Chest wall: No tenderness.  Abdominal:     General: Bowel sounds are normal. There is no distension.     Palpations: Abdomen is soft. There is no mass.     Tenderness: There is no abdominal tenderness. There is no right CVA tenderness, left CVA tenderness, guarding or rebound.  Musculoskeletal:        General: No swelling or tenderness.     Right lower leg: No edema.     Left lower leg: No edema.     Comments: Limited ROM to lumbar spine due to  pain.lies on lateral side on exam tablet when getting in and out of the exam tablet  Neurological:     Mental Status: She is alert. Mental status is at baseline.     Cranial Nerves: No cranial nerve deficit.     Motor: No weakness.  Psychiatric:          Mood and Affect: Mood normal.        Behavior: Behavior normal.        Thought Content: Thought content normal.        Judgment: Judgment normal.     Labs reviewed: Recent Labs    08/07/19 0436 08/07/19 1404 11/09/19 1458 04/07/20 1638 04/08/20 0240  NA 140   < > 140 140 136  K 2.9*   < > 3.7 3.7 3.6  CL 106   < > 103 105 104  CO2 24   < > 27 25 24  GLUCOSE 122*   < > 124 131* 144*  BUN 8   < > 14 13 13  CREATININE 0.71   < > 0.94* 0.71 0.75  CALCIUM 9.3   < > 10.1 9.6 8.7*  MG 1.7  --   --   --   --    < > = values in this interval not displayed.   Recent Labs    08/06/19 1221 09/06/19 1156 09/17/19 0824 04/07/20 1638  AST 21 20 20 26  ALT 15 12 12 24  ALKPHOS 55  --  69 74  BILITOT 0.8 0.5 0.6 0.7  PROT 6.5 7.1 6.9 6.7  ALBUMIN 3.5  --  4.1 3.8   Recent Labs    09/06/19 1156 09/17/19 0824 04/07/20 1638 04/08/20 0315  WBC 4.3 3.7* 4.3 4.2  NEUTROABS 2,511 1.1* 2.1  --   HGB 12.7 13.2 12.2 11.6*  HCT 38.2 40.7 36.0 34.7*  MCV 89.0 91.3 91.1 90.8  PLT 227 181 185 162   Lab Results  Component Value Date   TSH 0.283 (L) 04/07/2020   Lab Results  Component Value Date   HGBA1C 6.5 (H) 09/06/2019   Lab Results  Component Value Date   CHOL 226 (H) 02/06/2019   HDL 81 02/06/2019   LDLCALC 126 (H) 02/06/2019   TRIG 86 02/06/2019   CHOLHDL 2.8 02/06/2019    Significant Diagnostic Results in last 30 days:  No results found.  Assessment/Plan 1. Primary osteoarthritis involving multiple joints Hips,shoulders and hands.voltaren has been effective a long with Norco. -will refill voltaren gel - diclofenac Sodium (VOLTAREN) 1 % GEL; Apply 4 g topically 3 (three) times daily. To lower back, left hand, and  fingers  Dispense: 100 g; Refill: 3  2. Chronic bilateral low back pain without sciatica Continue on Norco for pain.  Family/ staff Communication: Reviewed plan of care with patient and care giver   Labs/tests ordered: None   Next Appointment: 4 months for medical management of chronic issues with Dr.Reed.   C , NP 

## 2020-05-30 LAB — TSH: TSH: 45.03 mIU/L — ABNORMAL HIGH (ref 0.40–4.50)

## 2020-05-31 ENCOUNTER — Encounter: Payer: Self-pay | Admitting: Internal Medicine

## 2020-05-31 ENCOUNTER — Other Ambulatory Visit: Payer: Self-pay | Admitting: Orthopedic Surgery

## 2020-05-31 DIAGNOSIS — Z20828 Contact with and (suspected) exposure to other viral communicable diseases: Secondary | ICD-10-CM | POA: Diagnosis not present

## 2020-05-31 DIAGNOSIS — E039 Hypothyroidism, unspecified: Secondary | ICD-10-CM

## 2020-05-31 MED ORDER — LEVOTHYROXINE SODIUM 125 MCG PO TABS: 125.0000 ug | ORAL_TABLET | Freq: Every day | ORAL | 3 refills | Status: DC

## 2020-06-03 ENCOUNTER — Telehealth: Payer: Self-pay | Admitting: *Deleted

## 2020-06-03 ENCOUNTER — Other Ambulatory Visit: Payer: Self-pay | Admitting: Internal Medicine

## 2020-06-03 DIAGNOSIS — I10 Essential (primary) hypertension: Secondary | ICD-10-CM

## 2020-06-03 DIAGNOSIS — R600 Localized edema: Secondary | ICD-10-CM

## 2020-06-03 NOTE — Telephone Encounter (Signed)
Spoke with [employer]. She scheduled an appointment with [name] for tomorrow.

## 2020-06-03 NOTE — Telephone Encounter (Signed)
Joy Ward with [facility] [phone] called and left message on Clinical intake stating that patient was refusing her meals and refusing night time medication and stating that residents are stealing from her room.  Joy Ward is requesting an appointment for patient and asked that I return her call.   I called [place] to speak with Joy Ward and to schedule an appointment twice and each time I get transferred and placed on hold for over 20 minutes each time. No one comes back to phone.   Will try again tomorrow.

## 2020-06-03 NOTE — Telephone Encounter (Signed)
rx sent to pharmacy by e-script  

## 2020-06-04 ENCOUNTER — Ambulatory Visit: Payer: Self-pay | Admitting: Orthopedic Surgery

## 2020-06-04 ENCOUNTER — Telehealth: Payer: Self-pay | Admitting: *Deleted

## 2020-06-04 NOTE — Telephone Encounter (Signed)
Received fax from [employer] [phone] for prior Authorization for Diclofenac Sodium Gel Filled out form and placed in Joy Ward's folder to review and fill out and sign.  To be faxed back to [employer] Fax: [phone] once completed.

## 2020-06-06 ENCOUNTER — Other Ambulatory Visit: Payer: Self-pay | Admitting: Family

## 2020-06-07 DIAGNOSIS — Z20828 Contact with and (suspected) exposure to other viral communicable diseases: Secondary | ICD-10-CM | POA: Diagnosis not present

## 2020-06-11 ENCOUNTER — Telehealth: Payer: Self-pay | Admitting: *Deleted

## 2020-06-11 DIAGNOSIS — E785 Hyperlipidemia, unspecified: Secondary | ICD-10-CM

## 2020-06-11 DIAGNOSIS — E1169 Type 2 diabetes mellitus with other specified complication: Secondary | ICD-10-CM

## 2020-06-11 MED ORDER — LIVALO 4 MG PO TABS: 4.0000 mg | ORAL_TABLET | Freq: Every day | ORAL | 3 refills | Status: DC

## 2020-06-11 NOTE — Telephone Encounter (Signed)
Looks like she is supposed to be on Livalo as recommended by Dinah when she saw her.

## 2020-06-11 NOTE — Telephone Encounter (Signed)
[facility] called wanting clarification on patient's medication. Stated that they have 2 cholesterol medications on file Livalo and Pravastatin.  Wants to know which one the patient should be taking.   We do not have either one in current medication list.   I tried calling patient to confirm and NA.   Please Advise.     Telephone Message Dated 04/22/2020: Gayland Curry, DO to Me   7:13 AM Note D/c Pravastatin.  Start livalo as approved.  I was not involved in the recent discussion about this.  Pt saw NP.       OV Note Dated 05/29/2020: Medication Changes      (Completed Course)     (Discontinued by provider)  Patient not taking:  No sig reported

## 2020-06-11 NOTE — Telephone Encounter (Signed)
Rx Pended and sent to Dr. Mariea Clonts for approval due to [place].

## 2020-06-14 DIAGNOSIS — G894 Chronic pain syndrome: Secondary | ICD-10-CM | POA: Diagnosis not present

## 2020-06-14 DIAGNOSIS — M961 Postlaminectomy syndrome, not elsewhere classified: Secondary | ICD-10-CM | POA: Diagnosis not present

## 2020-06-17 ENCOUNTER — Encounter: Payer: Self-pay | Admitting: Internal Medicine

## 2020-06-18 DIAGNOSIS — Z20828 Contact with and (suspected) exposure to other viral communicable diseases: Secondary | ICD-10-CM | POA: Diagnosis not present

## 2020-06-27 ENCOUNTER — Ambulatory Visit: Payer: Medicare Other | Admitting: Internal Medicine

## 2020-07-01 ENCOUNTER — Telehealth: Payer: Self-pay | Admitting: *Deleted

## 2020-07-01 NOTE — Telephone Encounter (Signed)
Patient does not like swallowing pills, facility has started to crush them. This weekend she was given a fish oil capsule and she coughed after swallowing. Currently tolerating regular diet, no other symptoms reported at this time. Verbal orders for speech therapy consult give to Sidney at [facility]. Advised to keep 03/11 appointment at this time.

## 2020-07-01 NOTE — Telephone Encounter (Signed)
Joy Ward with [facility] called and left message on Voicemail stating that patient is refusing to take medication. They started crushing medications and putting them in applesauce because patient is complaining about swallowing.   Called and spoke with Pamala Hurry to schedule an appointment for evaluation.  Pamala Hurry with [facility] stated that they will call back to schedule an appointment for patient to be seen.

## 2020-07-05 ENCOUNTER — Other Ambulatory Visit: Payer: Self-pay

## 2020-07-05 ENCOUNTER — Encounter: Payer: Self-pay | Admitting: Orthopedic Surgery

## 2020-07-05 ENCOUNTER — Ambulatory Visit (INDEPENDENT_AMBULATORY_CARE_PROVIDER_SITE_OTHER): Payer: Medicare Other | Admitting: Orthopedic Surgery

## 2020-07-05 VITALS — BP 140/90 | HR 80 | Temp 97.9°F | Resp 20 | Ht 64.0 in | Wt 158.0 lb

## 2020-07-05 DIAGNOSIS — R1310 Dysphagia, unspecified: Secondary | ICD-10-CM

## 2020-07-05 DIAGNOSIS — K219 Gastro-esophageal reflux disease without esophagitis: Secondary | ICD-10-CM | POA: Diagnosis not present

## 2020-07-05 MED ORDER — OMEPRAZOLE 40 MG PO CPDR: 40.0000 mg | DELAYED_RELEASE_CAPSULE | Freq: Every day | ORAL | 3 refills | Status: DC

## 2020-07-05 NOTE — Patient Instructions (Signed)
Start omeprazole for acid reflux Will make referral for speech therapist to evaluate swallowing  Food Choices for Gastroesophageal Reflux Disease, Adult When you have gastroesophageal reflux disease (GERD), the foods you eat and your eating habits are very important. Choosing the right foods can help ease your discomfort. Think about working with a food expert (dietitian) to help you make good choices. What are tips for following this plan? Reading food labels  Look for foods that are low in saturated fat. Foods that may help with your symptoms include: ? Foods that have less than 5% of daily value (DV) of fat. ? Foods that have 0 grams of trans fat. Cooking  Do not fry your food.  Cook your food by baking, steaming, grilling, or broiling. These are all methods that do not need a lot of fat for cooking.  To add flavor, try to use herbs that are low in spice and acidity. Meal planning  Choose healthy foods that are low in fat, such as: ? Fruits and vegetables. ? Whole grains. ? Low-fat dairy products. ? Lean meats, fish, and poultry.  Eat small meals often instead of eating 3 large meals each day. Eat your meals slowly in a place where you are relaxed. Avoid bending over or lying down until 2-3 hours after eating.  Limit high-fat foods such as fatty meats or fried foods.  Limit your intake of fatty foods, such as oils, butter, and shortening.  Avoid the following as told by your doctor: ? Foods that cause symptoms. These may be different for different people. Keep a food diary to keep track of foods that cause symptoms. ? Alcohol. ? Drinking a lot of liquid with meals. ? Eating meals during the 2-3 hours before bed.   Lifestyle  Stay at a healthy weight. Ask your doctor what weight is healthy for you. If you need to lose weight, work with your doctor to do so safely.  Exercise for at least 30 minutes on 5 or more days each week, or as told by your doctor.  Wear loose-fitting  clothes.  Do not smoke or use any products that contain nicotine or tobacco. If you need help quitting, ask your doctor.  Sleep with the head of your bed higher than your feet. Use a wedge under the mattress or blocks under the bed frame to raise the head of the bed.  Chew sugar-free gum after meals. What foods should eat? Eat a healthy, well-balanced diet of fruits, vegetables, whole grains, low-fat dairy products, lean meats, fish, and poultry. Each person is different. Foods that may cause symptoms in one person may not cause any symptoms in another person. Work with your doctor to find foods that are safe for you. The items listed above may not be a complete list of what you can eat and drink. Contact a food expert for more options.   What foods should I avoid? Limiting some of these foods may help in managing the symptoms of GERD. Everyone is different. Talk with a food expert or your doctor to help you find the exact foods to avoid, if any. Fruits Any fruits prepared with added fat. Any fruits that cause symptoms. For some people, this may include citrus fruits, such as oranges, grapefruit, pineapple, and lemons. Vegetables Deep-fried vegetables. [place] fries. Any vegetables prepared with added fat. Any vegetables that cause symptoms. For some people, this may include tomatoes and tomato products, chili peppers, onions and garlic, and horseradish. Grains Pastries or quick breads  with added fat. Meats and other proteins High-fat meats, such as fatty beef or pork, hot dogs, ribs, ham, sausage, salami, and bacon. Fried meat or protein, including fried fish and fried chicken. Nuts and nut butters, in large amounts. Dairy Whole milk and chocolate milk. Sour cream. Cream. Ice cream. Cream cheese. Milkshakes. Fats and oils Butter. Margarine. Shortening. Ghee. Beverages Coffee and tea, with or without caffeine. Carbonated beverages. Sodas. Energy drinks. Fruit juice made with acidic fruits,  such as orange or grapefruit. Tomato juice. Alcoholic drinks. Sweets and desserts Chocolate and cocoa. Donuts. Seasonings and condiments Pepper. Peppermint and spearmint. Added salt. Any condiments, herbs, or seasonings that cause symptoms. For some people, this may include curry, hot sauce, or vinegar-based salad dressings. The items listed above may not be a complete list of what you should not eat and drink. Contact a food expert for more options. Questions to ask your doctor Diet and lifestyle changes are often the first steps that are taken to manage symptoms of GERD. If diet and lifestyle changes do not help, talk with your doctor about taking medicines. Where to find more information  International Foundation for Gastrointestinal Disorders: aboutgerd.org Summary  When you have GERD, food and lifestyle choices are very important in easing your symptoms.  Eat small meals often instead of 3 large meals a day. Eat your meals slowly and in a place where you are relaxed.  Avoid bending over or lying down until 2-3 hours after eating.  Limit high-fat foods such as fatty meats or fried foods. This information is not intended to replace advice given to you by your health care provider. Make sure you discuss any questions you have with your health care provider. Document Revised: 10/23/2019 Document Reviewed: 10/23/2019 Elsevier Patient Education  [place].

## 2020-07-05 NOTE — Progress Notes (Signed)
Careteam: Patient Care Team: Yvonna Alanis, NP as PCP - General (Adult Health Nurse Practitioner)  Seen by: Windell Moulding, AGNP-C  PLACE OF SERVICE:  [place] Directive information Does Patient Have a Medical Advance Directive?: No, Would patient like information on creating a medical advance directive?: No - Patient declined  Allergies  Allergen Reactions  . Clindamycin Hcl Other (See Comments)    REACTION: C. difficile colitis  . Sulfamethoxazole-Trimethoprim Other (See Comments)     Eye redness  . Zanaflex [Tizanidine Hcl] Anaphylaxis  . Metronidazole Hives  . Statins Other (See Comments)    Side Effect: muscle pain Has tried Zocor, Crestor--she does not want to take a statin, period  . Bee Venom Other (See Comments)    Unknown reaction  . Ace Inhibitors Cough  . Adhesive [Tape] Itching, Rash and Other (See Comments)    Blistering of skin     Chief Complaint  Patient presents with  . Acute Visit    Patient choked on meat  has a lot of burping and medication gets stuck in throat     HPI: Patient is a 80 y.o. female seen today as a acute visit for difficulty swallowing.   Daughter present for visit.   In the past few weeks, she has noticed some difficulty swallowing large pills and food. Facility nurses have started crushing her medications and giving them with pudding. Describes food as getting "hung" in her throat. Remains on a regular diet with thin liquids. Speech therapy evaluation ordered at facility earlier in the week. No information given if it was scheduled or if she was seen. In addition, she reports belching frequently. She has belched numerous times during this encounter. Denies drinking carbonated beverages. Daughter reports she use to take acid reflux medication in the past, has been off of it for years. Asking to try acid reflux medication today.   Review of Systems:  Review of Systems  Constitutional: Negative for fever and malaise/fatigue.   HENT:       Difficulty swallowing  Respiratory: Negative for cough, shortness of breath and wheezing.   Cardiovascular: Negative for chest pain and leg swelling.  Gastrointestinal: Positive for heartburn. Negative for abdominal pain, constipation, nausea and vomiting.       Belching  Psychiatric/Behavioral: Positive for memory loss. Negative for depression. The patient is not nervous/anxious.     Past Medical History:  Diagnosis Date  . Allergy   . Arthritis   . Chronic back pain   . DDD (degenerative disc disease), lumbar   . Diabetes mellitus without complication ([place])   . Diabetes type 2, uncontrolled ([place]) 09/18/2006   Annotation: Noninsulin dependent Qualifier: Diagnosis of  By: Amil Amen MD, Benjamine Mola    . Former tobacco use   . Hyperlipidemia    a. H/o muscle aches with statins per PCP notes.  . Hypertension   . Hypothyroidism    a. prior thyroid removal.  . Memory loss or impairment 03/29/2019  . Stroke [facility])    a. Listed in PCP notes: "History of stroke:  History of terrible headache.  Had a CT scan of brain and found "ministroke"  Was removed from ERT subsequently. "   Past Surgical History:  Procedure Laterality Date  . ABDOMINAL HYSTERECTOMY  1994   TAH, not clear if unilateral oophorectomy as well.  [facility] [place]  . CHOLECYSTECTOMY  1973   open  . DECOMPRESSIVE LUMBAR LAMINECTOMY LEVEL 4  2005  In [place]  . THYROIDECTOMY  2012   multinodular goiter   Social History:   reports that she has quit smoking. She has never used smokeless tobacco. She reports that she does not drink alcohol and does not use drugs.  Family History  Problem Relation Age of Onset  . Heart disease Mother        unclear details "atherosclerosis"  . Diabetes Mother   . Alcohol abuse Sister   . Diabetes Brother   . Diabetes Brother   . Drug abuse Brother   . Diabetes Daughter   . Heart disease Son        CHF  . Alcohol abuse Son      Medications: Patient's Medications  New Prescriptions   No medications on file  Previous Medications   ACETAMINOPHEN (TYLENOL) 325 MG TABLET    Take 650 mg by mouth every 6 (six) hours as needed for mild pain (fever greater than or equal to 101 degrees).   ALUMINUM-MAGNESIUM HYDROXIDE-SIMETHICONE (MAALOX) 462-703-50 MG/5ML SUSP    Take 30 mLs by mouth every 6 (six) hours as needed (heartburn/indigestion).   AMLODIPINE (NORVASC) 5 MG TABLET    TAKE 1 TABLET BY MOUTH EVERY DAY   BISACODYL (DULCOLAX) 10 MG SUPPOSITORY    Place 1 suppository (10 mg total) rectally daily as needed for moderate constipation.   CALCIUM-VITAMIN D-VITAMIN K (VIACTIV CALCIUM PLUS D) 650-12.5-40 MG-MCG-MCG CHEW    Chew 1 tablet by mouth daily.   DICLOFENAC SODIUM (VOLTAREN) 1 % GEL    Apply 4 g topically 3 (three) times daily. To lower back, left hand, and fingers   DONEPEZIL (ARICEPT) 5 MG TABLET    TAKE 1 TABLET BY MOUTH AT BEDTIME   FUROSEMIDE (LASIX) 20 MG TABLET    TAKE 2 TABLETS BY MOUTH EVERY DAY   GUAIFENESIN (ROBITUSSIN) 100 MG/5ML SYRUP    Take 200 mg by mouth every 6 (six) hours as needed for cough.   HYDROCODONE-ACETAMINOPHEN (NORCO) 10-325 MG TABLET    Take 1 tablet by mouth See admin instructions. Take one tablet by mouth daily at 9am, 1pm, 5pm and 9pm   LEVOTHYROXINE (SYNTHROID) 125 MCG TABLET    Take 1 tablet (125 mcg total) by mouth daily.   LOPERAMIDE (IMODIUM) 2 MG CAPSULE    Take 2 mg by mouth as needed for diarrhea or loose stools (do not exceed 8 doses in 24 hours).   LORATADINE (CLARITIN) 10 MG TABLET    Take 1 tablet (10 mg total) by mouth daily.   MAGNESIUM HYDROXIDE (MILK OF MAGNESIA) 400 MG/5ML SUSPENSION    Take 30 mLs by mouth at bedtime as needed (constipation).   METFORMIN (GLUCOPHAGE) 500 MG TABLET    TAKE 1 TABLET BY MOUTH EVERY DAY   NEOMYCIN-BACITRACIN-POLYMYXIN (TRIPLE ANTIBIOTIC) [phone] OINT    Apply 1 application topically as needed (minor skin tears/abrasions).   OMEGA-3  FATTY ACIDS (FISH OIL) 1000 MG CAPS    Take 1,000 mg by mouth daily.    PITAVASTATIN CALCIUM (LIVALO) 4 MG TABS    Take 1 tablet (4 mg total) by mouth daily.   POLYETHYLENE GLYCOL (MIRALAX / GLYCOLAX) 17 G PACKET    Take 17 g by mouth daily.   POTASSIUM CHLORIDE SA (KLOR-CON) 20 MEQ TABLET    TAKE 1 TABLET BY MOUTH EVERY DAY   QUETIAPINE (SEROQUEL) 100 MG TABLET    TAKE 1 TABLET BY MOUTH AT BEDTIME   SENNA (SENOKOT) 8.6 MG TABS TABLET    Take  1 tablet (8.6 mg total) by mouth every Tuesday, Thursday, and Saturday at 6 PM.   TRAZODONE (DESYREL) 100 MG TABLET    TAKE 1 TABLET BY MOUTH AT BEDTIME AS NEEDED FOR SLEEP  Modified Medications   No medications on file  Discontinued Medications   No medications on file    Physical Exam:  Vitals:   07/05/20 1608  BP: 140/90  Pulse: 80  Resp: 20  Temp: 97.9 F (36.6 C)  TempSrc: Temporal  SpO2: 97%  Weight: 158 lb (71.7 kg)  Height: 5\' 4"  (1.626 m)   Body mass index is 27.12 kg/m. Wt Readings from Last 3 Encounters:  07/05/20 158 lb (71.7 kg)  05/29/20 156 lb 6.4 oz (70.9 kg)  04/16/20 158 lb (71.7 kg)    Physical Exam Vitals reviewed.  Constitutional:      General: She is not in acute distress. HENT:     Head: Normocephalic.  Neck:     Comments: Thyroid removed Cardiovascular:     Rate and Rhythm: Normal rate and regular rhythm.     Pulses: Normal pulses.     Heart sounds: Normal heart sounds.  Pulmonary:     Effort: Pulmonary effort is normal. No respiratory distress.     Breath sounds: Normal breath sounds.  Abdominal:     General: Bowel sounds are normal. There is no distension.     Palpations: Abdomen is soft.     Tenderness: There is no abdominal tenderness.  Musculoskeletal:     Cervical back: Normal range of motion.  Lymphadenopathy:     Cervical: No cervical adenopathy.  Skin:    General: Skin is warm and dry.  Neurological:     General: No focal deficit present.     Mental Status: She is alert. Mental status  is at baseline.  Psychiatric:        Mood and Affect: Mood normal.        Behavior: Behavior normal.    Labs reviewed: Basic Metabolic Panel: Recent Labs    08/07/19 0436 08/07/19 1404 09/06/19 1156 09/17/19 0824 11/09/19 1458 04/07/20 1638 04/07/20 1657 04/08/20 0240 05/29/20 1422  NA 140   < > 139   < > 140 140  --  136  --   K 2.9*   < > 4.2   < > 3.7 3.7  --  3.6  --   CL 106   < > 104   < > 103 105  --  104  --   CO2 24   < > 24   < > 27 25  --  24  --   GLUCOSE 122*  --  142*   < > 124 131*  --  144*  --   BUN 8   < > 9   < > 14 13  --  13  --   CREATININE 0.71   < > 0.66   < > 0.94* 0.71  --  0.75  --   CALCIUM 9.3   < > 10.0   < > 10.1 9.6  --  8.7*  --   MG 1.7  --   --   --   --   --   --   --   --   TSH  --   --  4.10  --   --   --  0.283*  --  45.03*   < > = values in this interval not displayed.   Liver Function  Tests: Recent Labs    08/06/19 1221 09/06/19 1156 09/17/19 0824 04/07/20 1638  AST 21 20 20 26   ALT 15 12 12 24   ALKPHOS 55  --  69 74  BILITOT 0.8 0.5 0.6 0.7  PROT 6.5 7.1 6.9 6.7  ALBUMIN 3.5  --  4.1 3.8   Recent Labs    04/07/20 1638  LIPASE 30   No results for input(s): AMMONIA in the last 8760 hours. CBC: Recent Labs    09/06/19 1156 09/17/19 0824 04/07/20 1638 04/08/20 0315  WBC 4.3 3.7* 4.3 4.2  NEUTROABS 2,511 1.1* 2.1  --   HGB 12.7 13.2 12.2 11.6*  HCT 38.2 40.7 36.0 34.7*  MCV 89.0 91.3 91.1 90.8  PLT 227 181 185 162   Lipid Panel: No results for input(s): CHOL, HDL, LDLCALC, TRIG, CHOLHDL, LDLDIRECT in the last 8760 hours. TSH: Recent Labs    09/06/19 1156 04/07/20 1657 05/29/20 1422  TSH 4.10 0.283* 45.03*   A1C: Lab Results  Component Value Date   HGBA1C 6.5 (H) 09/06/2019     Assessment/Plan: 1. Gastroesophageal reflux disease without esophagitis - she is a poor historian due to dementia - suspect she is having some reflux and it is also stimulating belching - omeprazole (PRILOSEC) 40 MG  capsule; Take 1 capsule (40 mg total) by mouth daily.  Dispense: 30 capsule; Refill: 3  2. Dysphagia, unspecified type - having trouble swallowing large pills like fish oil - unknown if speech therapy consult was done - recommend drinking enough fluids when taking medicine of eating - SLP modified barium swallow; Future   I provided 21 minutes of face-to-face time during this encounter.    Next appt: Visit date not found [place], [facility] [phone]

## 2020-07-08 ENCOUNTER — Other Ambulatory Visit (HOSPITAL_COMMUNITY): Payer: Self-pay

## 2020-07-08 DIAGNOSIS — R1310 Dysphagia, unspecified: Secondary | ICD-10-CM

## 2020-07-08 DIAGNOSIS — R059 Cough, unspecified: Secondary | ICD-10-CM

## 2020-07-10 ENCOUNTER — Other Ambulatory Visit: Payer: Self-pay

## 2020-07-10 DIAGNOSIS — K5901 Slow transit constipation: Secondary | ICD-10-CM

## 2020-07-10 MED ORDER — BISACODYL 10 MG RE SUPP: 10.0000 mg | Freq: Every day | RECTAL | 0 refills | Status: DC | PRN

## 2020-07-11 DIAGNOSIS — E119 Type 2 diabetes mellitus without complications: Secondary | ICD-10-CM | POA: Diagnosis not present

## 2020-07-11 DIAGNOSIS — H401232 Low-tension glaucoma, bilateral, moderate stage: Secondary | ICD-10-CM | POA: Diagnosis not present

## 2020-07-15 ENCOUNTER — Other Ambulatory Visit: Payer: Self-pay | Admitting: Orthopedic Surgery

## 2020-07-15 DIAGNOSIS — K5901 Slow transit constipation: Secondary | ICD-10-CM

## 2020-07-18 ENCOUNTER — Ambulatory Visit: Payer: Medicare Other | Admitting: Internal Medicine

## 2020-07-18 DIAGNOSIS — M5416 Radiculopathy, lumbar region: Secondary | ICD-10-CM | POA: Diagnosis not present

## 2020-07-22 ENCOUNTER — Other Ambulatory Visit: Payer: Self-pay | Admitting: Internal Medicine

## 2020-07-24 ENCOUNTER — Encounter (HOSPITAL_COMMUNITY): Payer: Medicare Other

## 2020-07-24 ENCOUNTER — Ambulatory Visit (HOSPITAL_COMMUNITY): Payer: Medicare Other

## 2020-07-30 ENCOUNTER — Other Ambulatory Visit: Payer: Self-pay | Admitting: Orthopedic Surgery

## 2020-07-30 ENCOUNTER — Ambulatory Visit (HOSPITAL_COMMUNITY)
Admission: RE | Admit: 2020-07-30 | Discharge: 2020-07-30 | Disposition: A | Discharge status: Home / Self Care | Payer: Medicare Other | Source: Ambulatory Visit | Attending: Orthopedic Surgery | Admitting: Orthopedic Surgery

## 2020-07-30 ENCOUNTER — Ambulatory Visit (HOSPITAL_COMMUNITY): Admission: RE | Admit: 2020-07-30 | Payer: Medicare Other | Source: Ambulatory Visit

## 2020-07-30 ENCOUNTER — Other Ambulatory Visit: Payer: Self-pay

## 2020-07-30 DIAGNOSIS — R1310 Dysphagia, unspecified: Secondary | ICD-10-CM

## 2020-08-06 ENCOUNTER — Telehealth: Payer: Self-pay

## 2020-08-06 NOTE — Telephone Encounter (Signed)
Patient daughter "Joy Ward" called and states that medication Furosemide is making patient have to use the bathroom a lot. She wants to know if they can cut back to 20mg  daily instead of 40mg . Message routed to PCP Sabra Heck Lillette Boxer, MD . Please Advise.

## 2020-08-06 NOTE — Telephone Encounter (Signed)
Joy Ward from [facility] states that she needs an order to discontinue patient furosemide and calcium. Patient is refusing to take potassium and omeprazole now. Message routed to PCP Sabra Heck Lillette Boxer, MD. Please Advise.

## 2020-08-07 NOTE — Telephone Encounter (Signed)
Called patient daughter Ozie Dimaria and no answer. Voicemail was left with office call back number.

## 2020-08-07 NOTE — Telephone Encounter (Signed)
In reviewing her record I am unsure of the need for diuretic.  She may be able to cut dose in half but if she notices more swelling will have to go back to 40 mg

## 2020-08-08 MED ORDER — FUROSEMIDE 20 MG PO TABS: 20.0000 mg | ORAL_TABLET | Freq: Every day | ORAL | 0 refills | Status: DC

## 2020-08-08 NOTE — Telephone Encounter (Signed)
Patients daughter [name] called returning call from [place] I read the notes to her from Dr. Sabra Heck to her question about patients Lasix being reduced by half and I will check to see if new prescription has been sent if not I will send to pharmacy for patient today checked and no script has been sent since February script sent

## 2020-08-20 ENCOUNTER — Telehealth: Payer: Self-pay

## 2020-08-20 NOTE — Telephone Encounter (Signed)
Joy Ward with [name] stated they have requested d/c orders on patient regarding non compliance and refusal to take medications since March. Joy Ward has placed several phone calls to our office and sent faxes. Joy Ward is now in a non compliance status with the state due to lack of response from Joy Ward.   Patient is refusing to take potassium, calcium, and furosemide. Joy Ward is asking if Dr.Miller to [place] write an order ok to discontinued all of the above,  provide his physical signature, and have order faxed today to [phone]  I reviewed correspondence under media from 08/05/2020 and one of the providers (I presume Windell Moulding, NP, not certain as her signature is not on the document) indicated patient would need to be seen before d/c order can be provided.   Left message on voicemail for patients daughter to return call when available, to see if patient available today for appointment with Dr.Miler at 2:00 pm    Awaiting reply  I will send to Wardell Honour, MD to review and to provide advice or recommendations in the interim

## 2020-08-21 ENCOUNTER — Ambulatory Visit: Payer: Self-pay | Admitting: Nurse Practitioner

## 2020-08-21 NOTE — Telephone Encounter (Signed)
I called Jenny Reichmann this morning to inform her that I was waiting to her back from patients family as patient will need an appointment prior to orders to d/c medications. Jenny Reichmann stated she could schedule the appointment for the patient. Appointment was scheduled today with Sherrie Mustache, NP at 2:15 pm.  Since then appointment was canceled. Per Lovena Le (registra), Jenny Reichmann called back to cancel appointment after speaking with patients daughter who stated patient will keep appt for 08/27/2020 with Dr.Miller. Jenny Reichmann informed patients daughter that this will delay the process of getting medication discontinue orders and daughter is ok with the delay (again this is all second hand information shared with me from [facility], who answered the call to cancel appointment)

## 2020-08-21 NOTE — Telephone Encounter (Signed)
I have seen her within past month to discuss swallowing issues. Barium swallow study made and cancelled. I recommend she see a provider in the office before discontinuing medications.

## 2020-08-26 ENCOUNTER — Other Ambulatory Visit: Payer: Self-pay | Admitting: Family

## 2020-08-26 DIAGNOSIS — J3089 Other allergic rhinitis: Secondary | ICD-10-CM

## 2020-08-27 ENCOUNTER — Ambulatory Visit: Payer: Medicare Other | Admitting: Family Medicine

## 2020-08-29 ENCOUNTER — Other Ambulatory Visit: Payer: Self-pay

## 2020-08-29 ENCOUNTER — Ambulatory Visit (INDEPENDENT_AMBULATORY_CARE_PROVIDER_SITE_OTHER): Payer: Medicare Other | Admitting: Orthopedic Surgery

## 2020-08-29 ENCOUNTER — Encounter: Payer: Self-pay | Admitting: Orthopedic Surgery

## 2020-08-29 VITALS — BP 160/90 | HR 40 | Temp 97.0°F | Ht 64.0 in | Wt 163.0 lb

## 2020-08-29 DIAGNOSIS — E039 Hypothyroidism, unspecified: Secondary | ICD-10-CM | POA: Diagnosis not present

## 2020-08-29 DIAGNOSIS — Z780 Asymptomatic menopausal state: Secondary | ICD-10-CM | POA: Diagnosis not present

## 2020-08-29 DIAGNOSIS — E1169 Type 2 diabetes mellitus with other specified complication: Secondary | ICD-10-CM

## 2020-08-29 DIAGNOSIS — K219 Gastro-esophageal reflux disease without esophagitis: Secondary | ICD-10-CM

## 2020-08-29 DIAGNOSIS — J3089 Other allergic rhinitis: Secondary | ICD-10-CM | POA: Diagnosis not present

## 2020-08-29 DIAGNOSIS — Z79899 Other long term (current) drug therapy: Secondary | ICD-10-CM | POA: Diagnosis not present

## 2020-08-29 DIAGNOSIS — R600 Localized edema: Secondary | ICD-10-CM

## 2020-08-29 DIAGNOSIS — K5901 Slow transit constipation: Secondary | ICD-10-CM | POA: Diagnosis not present

## 2020-08-29 DIAGNOSIS — F0151 Vascular dementia with behavioral disturbance: Secondary | ICD-10-CM

## 2020-08-29 DIAGNOSIS — F5101 Primary insomnia: Secondary | ICD-10-CM | POA: Diagnosis not present

## 2020-08-29 DIAGNOSIS — I10 Essential (primary) hypertension: Secondary | ICD-10-CM

## 2020-08-29 DIAGNOSIS — F01518 Vascular dementia, unspecified severity, with other behavioral disturbance: Secondary | ICD-10-CM

## 2020-08-29 MED ORDER — LORATADINE 10 MG PO TABS: 10.0000 mg | ORAL_TABLET | Freq: Every day | ORAL | 6 refills | Status: DC

## 2020-08-29 MED ORDER — POTASSIUM CHLORIDE CRYS ER 20 MEQ PO TBCR: 20.0000 meq | EXTENDED_RELEASE_TABLET | Freq: Every day | ORAL | 5 refills | Status: DC

## 2020-08-29 MED ORDER — POLYETHYLENE GLYCOL 3350 17 G PO PACK
17.0000 g | PACK | Freq: Every day | ORAL | 0 refills | Status: DC
Start: 2020-08-29 — End: 2020-09-24

## 2020-08-29 MED ORDER — METFORMIN HCL 500 MG PO TABS: 1.0000 | ORAL_TABLET | Freq: Every day | ORAL | 1 refills | Status: DC

## 2020-08-29 MED ORDER — CALTRATE 600+D PLUS MINERALS 600-800 MG-UNIT PO TABS: 600.0000 mg | ORAL_TABLET | Freq: Every day | ORAL | 5 refills | Status: DC

## 2020-08-29 MED ORDER — TRAZODONE HCL 100 MG PO TABS: 100.0000 mg | ORAL_TABLET | Freq: Every day | ORAL | 5 refills | Status: DC

## 2020-08-29 MED ORDER — FUROSEMIDE 20 MG PO TABS: 20.0000 mg | ORAL_TABLET | Freq: Every day | ORAL | 3 refills | Status: DC

## 2020-08-29 MED ORDER — DONEPEZIL HCL 10 MG PO TABS: 10.0000 mg | ORAL_TABLET | Freq: Every day | ORAL | 0 refills | Status: DC

## 2020-08-29 MED ORDER — AMLODIPINE BESYLATE 5 MG PO TABS: 1.0000 | ORAL_TABLET | Freq: Every day | ORAL | 5 refills | Status: DC

## 2020-08-29 MED ORDER — OMEPRAZOLE 40 MG PO CPDR: 40.0000 mg | DELAYED_RELEASE_CAPSULE | Freq: Every day | ORAL | 3 refills | Status: DC

## 2020-08-29 MED ORDER — LEVOTHYROXINE SODIUM 112 MCG PO TABS: 112.0000 ug | ORAL_TABLET | Freq: Every day | ORAL | 3 refills | Status: DC

## 2020-08-29 NOTE — Patient Instructions (Signed)
Preventive Care 79 Years and Older, Female Preventive care refers to lifestyle choices and visits with your health care provider that can promote health and wellness. This includes:  A yearly physical exam. This is also called an annual wellness visit.  Regular dental and eye exams.  Immunizations.  Screening for certain conditions.  Healthy lifestyle choices, such as: ? Eating a healthy diet. ? Getting regular exercise. ? Not using drugs or products that contain nicotine and tobacco. ? Limiting alcohol use. What can I expect for my preventive care visit? Physical exam Your health care provider will check your:  Height and weight. These may be used to calculate your BMI (body mass index). BMI is a measurement that tells if you are at a healthy weight.  Heart rate and blood pressure.  Body temperature.  Skin for abnormal spots. Counseling Your health care provider may ask you questions about your:  Past medical problems.  Family's medical history.  Alcohol, tobacco, and drug use.  Emotional well-being.  Home life and relationship well-being.  Sexual activity.  Diet, exercise, and sleep habits.  History of falls.  Memory and ability to understand (cognition).  Work and work Statistician.  Pregnancy and menstrual history.  Access to firearms. What immunizations do I need? Vaccines are usually given at various ages, according to a schedule. Your health care provider will recommend vaccines for you based on your age, medical history, and lifestyle or other factors, such as travel or where you work.   What tests do I need? Blood tests  Lipid and cholesterol levels. These may be checked every 5 years, or more often depending on your overall health.  Hepatitis C test.  Hepatitis B test. Screening  Lung cancer screening. You may have this screening every year starting at age 79 if you have a 30-pack-year history of smoking and currently smoke or have quit within  the past 15 years.  Colorectal cancer screening. ? All adults should have this screening starting at age 79 and continuing until age 58. ? Your health care provider may recommend screening at age 79 if you are at increased risk. ? You will have tests every 1-10 years, depending on your results and the type of screening test.  Diabetes screening. ? This is done by checking your blood sugar (glucose) after you have not eaten for a while (fasting). ? You may have this done every 1-3 years.  Mammogram. ? This may be done every 1-2 years. ? Talk with your health care provider about how often you should have regular mammograms.  Abdominal aortic aneurysm (AAA) screening. You may need this if you are a current or former smoker.  BRCA-related cancer screening. This may be done if you have a family history of breast, ovarian, tubal, or peritoneal cancers. Other tests  STD (sexually transmitted disease) testing, if you are at risk.  Bone density scan. This is done to screen for osteoporosis. You may have this done starting at age 79. Talk with your health care provider about your test results, treatment options, and if necessary, the need for more tests. Follow these instructions at home: Eating and drinking  Eat a diet that includes fresh fruits and vegetables, whole grains, lean protein, and low-fat dairy products. Limit your intake of foods with high amounts of sugar, saturated fats, and salt.  Take vitamin and mineral supplements as recommended by your health care provider.  Do not drink alcohol if your health care provider tells you not to drink.  If you drink alcohol: ? Limit how much you have to 0-1 drink a day. ? Be aware of how much alcohol is in your drink. In the [place] one drink equals one 12 oz bottle of beer (355 mL), one 5 oz glass of wine (148 mL), or one 1 oz glass of hard liquor (44 mL).   Lifestyle  Take daily care of your teeth and gums. Brush your teeth every morning  and night with fluoride toothpaste. Floss one time each day.  Stay active. Exercise for at least 30 minutes 5 or more days each week.  Do not use any products that contain nicotine or tobacco, such as cigarettes, e-cigarettes, and chewing tobacco. If you need help quitting, ask your health care provider.  Do not use drugs.  If you are sexually active, practice safe sex. Use a condom or other form of protection in order to prevent STIs (sexually transmitted infections).  Talk with your health care provider about taking a low-dose aspirin or statin.  Find healthy ways to cope with stress, such as: ? Meditation, yoga, or listening to music. ? Journaling. ? Talking to a trusted person. ? Spending time with friends and family. Safety  Always wear your seat belt while driving or riding in a vehicle.  Do not drive: ? If you have been drinking alcohol. Do not ride with someone who has been drinking. ? When you are tired or distracted. ? While texting.  Wear a helmet and other protective equipment during sports activities.  If you have firearms in your house, make sure you follow all gun safety procedures. What's next?  Visit your health care provider once a year for an annual wellness visit.  Ask your health care provider how often you should have your eyes and teeth checked.  Stay up to date on all vaccines. This information is not intended to replace advice given to you by your health care provider. Make sure you discuss any questions you have with your health care provider. Document Revised: 04/03/2020 Document Reviewed: 04/07/2018 Elsevier Patient Education  2021 [employer]  

## 2020-08-29 NOTE — Progress Notes (Signed)
Careteam: Patient Care Team: Wardell Honour, MD as PCP - General (Family Medicine)  Seen by: Windell Moulding, AGNP-C  PLACE OF SERVICE:  [place] Directive information    Allergies  Allergen Reactions  . Clindamycin Hcl Other (See Comments)    REACTION: C. difficile colitis  . Sulfamethoxazole-Trimethoprim Other (See Comments)     Eye redness  . Zanaflex [Tizanidine Hcl] Anaphylaxis  . Metronidazole Hives  . Statins Other (See Comments)    Side Effect: muscle pain Has tried Zocor, Crestor--she does not want to take a statin, period  . Bee Venom Other (See Comments)    Unknown reaction  . Ace Inhibitors Cough  . Adhesive [Tape] Itching, Rash and Other (See Comments)    Blistering of skin     Chief Complaint  Patient presents with  . Medical Management of Chronic Issues    4 month follow up.Discuss need for Tetanus/Tdap, Eye exam, Urine microalbumin, Hemoglobin A1C and foot exam. Patient is refusing to take medications in the morning. She is suppose to take medications at 10am, but theytry to give them toher at 8 instead. Some medications that are PRN want to know if they can be scheduled.     HPI: Patient is a 80 y.o. female seen today to discuss medical management of chronic condiitons.   Daughter present for encounter.   She continues to reside at [facility] due to advanced dementia. In March, it was reported she was eating less and having trouble swallowing some of her medications. Prilosec was ordered and she was advised to have a swallow study performed. 04/26, [place] with [facility] contacted office requesting medications be discontinued because she was not taking them. It was decided she follow up at office with daughter before discontinuing medications. Swallow study was never completed.   Today, daughter is upset with staff at [facility]. Reports they will not "personalize" her medication administration. Loneta will often sleep late inot  the morning, around 10-11 AM. She has asked they give her morning medications at lunch, but they never do. She is not receiving her morning meds because she is still too sleepy to take them. Requesting medication review and to schedule morning meds at lunchtime.   At this time, she has not been observed coughing after eating or drinking. She has been known to have trouble swallowing potassium pill. Pudding with meds stopped recently.   She denies chest pain or shortness of breath.   Daughter denies any reports of falls. She is considering relocating back to [place] with Ariyanna due to family support.    Review of Systems:  Review of Systems  Unable to perform ROS: Dementia    Past Medical History:  Diagnosis Date  . Allergy   . Arthritis   . Chronic back pain   . DDD (degenerative disc disease), lumbar   . Diabetes mellitus without complication ([place])   . Diabetes type 2, uncontrolled ([place]) 09/18/2006   Annotation: Noninsulin dependent Qualifier: Diagnosis of  By: Amil Amen MD, Benjamine Mola    . Former tobacco use   . Hyperlipidemia    a. H/o muscle aches with statins per PCP notes.  . Hypertension   . Hypothyroidism    a. prior thyroid removal.  . Memory loss or impairment 03/29/2019  . Stroke [facility])    a. Listed in PCP notes: "History of stroke:  History of terrible headache.  Had a CT scan of brain and found "ministroke"  Was removed from  ERT subsequently. "   Past Surgical History:  Procedure Laterality Date  . ABDOMINAL HYSTERECTOMY  1994   TAH, not clear if unilateral oophorectomy as well.  [facility] [place]  . CHOLECYSTECTOMY  1973   open  . DECOMPRESSIVE LUMBAR LAMINECTOMY LEVEL 4  2005   In [place]  . THYROIDECTOMY  2012   multinodular goiter   Social History:   reports that she has quit smoking. She has never used smokeless tobacco. She reports that she does not drink alcohol and does not use drugs.  Family History  Problem Relation Age of  Onset  . Heart disease Mother        unclear details "atherosclerosis"  . Diabetes Mother   . Alcohol abuse Sister   . Diabetes Brother   . Diabetes Brother   . Drug abuse Brother   . Diabetes Daughter   . Heart disease Son        CHF  . Alcohol abuse Son     Medications: Patient's Medications  New Prescriptions   No medications on file  Previous Medications   ACETAMINOPHEN (TYLENOL) 325 MG TABLET    Take 650 mg by mouth every 6 (six) hours as needed for mild pain (fever greater than or equal to 101 degrees).   ALLERGY RELIEF 10 MG TABLET    TAKE 1 TABLET BY MOUTH EVERY DAY   ALUMINUM-MAGNESIUM HYDROXIDE-SIMETHICONE (MAALOX) [phone] MG/5ML SUSP    Take 30 mLs by mouth every 6 (six) hours as needed (heartburn/indigestion).   AMLODIPINE (NORVASC) 5 MG TABLET    TAKE 1 TABLET BY MOUTH EVERY DAY   BISACODYL (DULCOLAX) 10 MG SUPPOSITORY    Place 1 suppository rectally daily as needed for moderate constipation.   CALCIUM-VITAMIN D-VITAMIN K (VIACTIV CALCIUM PLUS D) 650-12.5-40 MG-MCG-MCG CHEW    Chew 1 tablet by mouth daily.   DICLOFENAC SODIUM (VOLTAREN) 1 % GEL    Apply 4 g topically 3 (three) times daily. To lower back, left hand, and fingers   DONEPEZIL (ARICEPT) 5 MG TABLET    TAKE 1 TABLET BY MOUTH AT BEDTIME   FUROSEMIDE (LASIX) 20 MG TABLET    TAKE 2 TABLETS BY MOUTH EVERY DAY   GUAIFENESIN (ROBITUSSIN) 100 MG/5ML SYRUP    Take 200 mg by mouth every 6 (six) hours as needed for cough.   HYDROCODONE-ACETAMINOPHEN (NORCO) 10-325 MG TABLET    Take 1 tablet by mouth See admin instructions. Take one tablet by mouth daily at 9am, 1pm, 5pm and 9pm   LEVOTHYROXINE (SYNTHROID) 125 MCG TABLET    Take 1 tablet (125 mcg total) by mouth daily.   LOPERAMIDE (IMODIUM) 2 MG CAPSULE    Take 2 mg by mouth as needed for diarrhea or loose stools (do not exceed 8 doses in 24 hours).   MAGNESIUM HYDROXIDE (MILK OF MAGNESIA) 400 MG/5ML SUSPENSION    Take 30 mLs by mouth at bedtime as needed  (constipation).   METFORMIN (GLUCOPHAGE) 500 MG TABLET    TAKE 1 TABLET BY MOUTH EVERY DAY   NEOMYCIN-BACITRACIN-POLYMYXIN (TRIPLE ANTIBIOTIC) 3.5-6104988075 OINT    Apply 1 application topically as needed (minor skin tears/abrasions).   OMEGA-3 FATTY ACIDS (FISH OIL) 1000 MG CAPS    Take 1,000 mg by mouth daily.    OMEPRAZOLE (PRILOSEC) 40 MG CAPSULE    Take 1 capsule (40 mg total) by mouth daily.   PITAVASTATIN CALCIUM (LIVALO) 4 MG TABS    Take 1 tablet (4 mg total) by  mouth daily.   POLYETHYLENE GLYCOL (MIRALAX / GLYCOLAX) 17 G PACKET    Take 17 g by mouth daily.   POTASSIUM CHLORIDE SA (KLOR-CON) 20 MEQ TABLET    TAKE 1 TABLET BY MOUTH EVERY DAY   QUETIAPINE (SEROQUEL) 100 MG TABLET    TAKE 1 TABLET BY MOUTH AT BEDTIME   SENNA (SENOKOT) 8.6 MG TABS TABLET    Take 1 tablet (8.6 mg total) by mouth every Tuesday, Thursday, and Saturday at 6 PM.   TRAZODONE (DESYREL) 100 MG TABLET    TAKE 1 TABLET BY MOUTH AT BEDTIME AS NEEDED FOR SLEEP  Modified Medications   No medications on file  Discontinued Medications   FUROSEMIDE (LASIX) 20 MG TABLET    Take 1 tablet (20 mg total) by mouth daily. If notice swelling is getting worse resume normal dosage of 40 mg daily    Physical Exam:  There were no vitals filed for this visit. There is no height or weight on file to calculate BMI. Wt Readings from Last 3 Encounters:  07/05/20 158 lb (71.7 kg)  05/29/20 156 lb 6.4 oz (70.9 kg)  04/16/20 158 lb (71.7 kg)    Physical Exam Vitals reviewed.  Constitutional:      General: She is not in acute distress. HENT:     Head: Normocephalic.  Eyes:     General:        Right eye: No discharge.        Left eye: No discharge.  Cardiovascular:     Rate and Rhythm: Normal rate and regular rhythm.     Pulses: Normal pulses.     Heart sounds: Normal heart sounds. No murmur heard.   Pulmonary:     Effort: Pulmonary effort is normal. No respiratory distress.     Breath sounds: Normal breath sounds. No  wheezing.  Abdominal:     General: Bowel sounds are normal. There is no distension.     Palpations: Abdomen is soft.     Tenderness: There is no abdominal tenderness.  Musculoskeletal:     Cervical back: Normal range of motion.     Right lower leg: Edema present.     Left lower leg: Edema present.     Comments: Non-pitting  Lymphadenopathy:     Cervical: No cervical adenopathy.  Skin:    General: Skin is warm and dry.     Capillary Refill: Capillary refill takes less than 2 seconds.  Neurological:     General: No focal deficit present.     Mental Status: She is alert. Mental status is at baseline.     Motor: Weakness present.     Gait: Gait abnormal.  Psychiatric:        Mood and Affect: Mood normal.        Behavior: Behavior normal.        Cognition and Memory: Memory is impaired.     Comments: She gave lots of compliments to staff. Very pleasant to talk with.      Labs reviewed: Basic Metabolic Panel: Recent Labs    09/06/19 1156 09/17/19 0824 11/09/19 1458 04/07/20 1638 04/07/20 1657 04/08/20 0240 05/29/20 1422  NA 139   < > 140 140  --  136  --   K 4.2   < > 3.7 3.7  --  3.6  --   CL 104   < > 103 105  --  104  --   CO2 24   < > 27 25  --  24  --   GLUCOSE 142*   < > 124 131*  --  144*  --   BUN 9   < > 14 13  --  13  --   CREATININE 0.66   < > 0.94* 0.71  --  0.75  --   CALCIUM 10.0   < > 10.1 9.6  --  8.7*  --   TSH 4.10  --   --   --  0.283*  --  45.03*   < > = values in this interval not displayed.   Liver Function Tests: Recent Labs    09/06/19 1156 09/17/19 0824 04/07/20 1638  AST 20 20 26   ALT 12 12 24   ALKPHOS  --  69 74  BILITOT 0.5 0.6 0.7  PROT 7.1 6.9 6.7  ALBUMIN  --  4.1 3.8   Recent Labs    04/07/20 1638  LIPASE 30   No results for input(s): AMMONIA in the last 8760 hours. CBC: Recent Labs    09/06/19 1156 09/17/19 0824 04/07/20 1638 04/08/20 0315  WBC 4.3 3.7* 4.3 4.2  NEUTROABS 2,511 1.1* 2.1  --   HGB 12.7 13.2 12.2  11.6*  HCT 38.2 40.7 36.0 34.7*  MCV 89.0 91.3 91.1 90.8  PLT 227 181 185 162   Lipid Panel: No results for input(s): CHOL, HDL, LDLCALC, TRIG, CHOLHDL, LDLDIRECT in the last 8760 hours. TSH: Recent Labs    09/06/19 1156 04/07/20 1657 05/29/20 1422  TSH 4.10 0.283* 45.03*   A1C: Lab Results  Component Value Date   HGBA1C 6.5 (H) 09/06/2019     Assessment/Plan 1. Hypothyroidism, unspecified type - unclear if she is given her medication on a empty stomach - TSH- 48.32 on 05/05 - advised to give on a empty stomach prior to first meal and med pass - levothyroxine (SYNTHROID) 112 MCG tablet; Take 1 tablet (112 mcg total) by mouth daily before lunch.  Dispense: 30 tablet; Refill: 3  2. Essential hypertension - she had not been receiving morning medications   - will change bp meds to lunch time - CBC with Differential/Platelet; Future - CMP - potassium chloride SA (KLOR-CON) 20 MEQ tablet; Take 1 tablet (20 mEq total) by mouth daily after lunch.  Dispense: 30 tablet; Refill: 5  3. Bilateral leg edema - non-pitting - cont lasix, will change to after lunch - furosemide (LASIX) 20 MG tablet; Take 1 tablet (20 mg total) by mouth daily after lunch.  Dispense: 30 tablet; Refill: 3 - amLODipine (NORVASC) 5 MG tablet; Take 1 tablet (5 mg total) by mouth daily after lunch.  Dispense: 30 tablet; Refill: 5  4. Non-seasonal allergic rhinitis, unspecified trigger - stable with medication - loratadine (ALLERGY RELIEF) 10 MG tablet; Take 1 tablet (10 mg total) by mouth daily after lunch.  Dispense: 30 tablet; Refill: 6  5. Gastroesophageal reflux disease without esophagitis - omeprazole (PRILOSEC) 40 MG capsule; Take 1 capsule (40 mg total) by mouth daily after lunch.  Dispense: 30 capsule; Refill: 3  6. Slow transit constipation Abdomen soft, normal bowel sounds -will discontinue senna and suppository due to med compliance - polyethylene glycol (MIRALAX / GLYCOLAX) 17 g packet; Take 17  g by mouth daily after lunch.  Dispense: 14 each; Refill: 0  7. Postmenopausal estrogen deficiency - she does not like calcium chews, daughter requesting pill - Calcium Carbonate-Vit D-Min (CALTRATE 600+D PLUS MINERALS) 600-800 MG-UNIT TABS; Take 600 mg by mouth daily after lunch.  Dispense: 30 tablet; Refill:  5  8. Vascular dementia with behavior disturbance (HCC) - no recent behavioral outbursts or falls - donepezil (ARICEPT) 10 MG tablet; Take 1 tablet (10 mg total) by mouth at bedtime.  Dispense: 90 tablet; Refill: 0  9. Type 2 diabetes mellitus with other specified complication, without long-term current use of insulin (HCC) - elevated glucose, will add on A1c - A1c - metFORMIN (GLUCOPHAGE) 500 MG tablet; Take 1 tablet (500 mg total) by mouth daily after lunch.  Dispense: 30 tablet; Refill: 1 - foot exam- future - eye exam- future - urine microalbumin- future  10. Primary insomnia - stable with medication - traZODone (DESYREL) 100 MG tablet; Take 1 tablet (100 mg total) by mouth at bedtime. for sleep  Dispense: 30 tablet; Refill: 5  11. Medication management - due to her advanced dementia, sleep schedule differs - she goes to bed late and wakes up around 10-11 AM - will try to schedule morning meds after lunch and see if she is more compliant  Labs/tests: cbc/diff, cmp, TSH, A1c, urine microalbumin, foot exam next routine visit. Discuss need for yearly eye exam.   Total time: 39 minutes. Greater than 50% of total time spent doing patient education and care coordination on medication compliance and advanced dementia.   Next appt: 12/12/2020 Windell Moulding, [facility] [phone]

## 2020-08-30 ENCOUNTER — Telehealth: Payer: Self-pay

## 2020-08-30 ENCOUNTER — Encounter: Payer: Self-pay | Admitting: Orthopedic Surgery

## 2020-08-30 LAB — COMPREHENSIVE METABOLIC PANEL
AG Ratio: 1.7 (calc) (ref 1.0–2.5)
ALT: 8 U/L (ref 6–29)
AST: 13 U/L (ref 10–35)
Albumin: 4.2 g/dL (ref 3.6–5.1)
Alkaline phosphatase (APISO): 59 U/L (ref 37–153)
BUN: 11 mg/dL (ref 7–25)
CO2: 25 mmol/L (ref 20–32)
Calcium: 9.5 mg/dL (ref 8.6–10.4)
Chloride: 105 mmol/L (ref 98–110)
Creat: 0.67 mg/dL (ref 0.60–0.93)
Globulin: 2.5 g/dL (calc) (ref 1.9–3.7)
Glucose, Bld: 135 mg/dL (ref 65–139)
Potassium: 3.8 mmol/L (ref 3.5–5.3)
Sodium: 139 mmol/L (ref 135–146)
Total Bilirubin: 0.4 mg/dL (ref 0.2–1.2)
Total Protein: 6.7 g/dL (ref 6.1–8.1)

## 2020-08-30 LAB — TSH: TSH: 48.32 mIU/L — ABNORMAL HIGH (ref 0.40–4.50)

## 2020-08-30 NOTE — Telephone Encounter (Signed)
Unable to add hemoglobin A1C due to only serum tubes drawn, hemoglobin A1C has to be plasma. To [name]

## 2020-09-04 ENCOUNTER — Other Ambulatory Visit: Payer: Self-pay | Admitting: *Deleted

## 2020-09-04 MED ORDER — QUETIAPINE FUMARATE 100 MG PO TABS: ORAL_TABLET | ORAL | 3 refills | Status: DC

## 2020-09-04 NOTE — Telephone Encounter (Signed)
[facility] requested refill.  °

## 2020-09-24 ENCOUNTER — Other Ambulatory Visit: Payer: Self-pay | Admitting: Orthopedic Surgery

## 2020-09-24 DIAGNOSIS — K5901 Slow transit constipation: Secondary | ICD-10-CM

## 2020-09-24 NOTE — Telephone Encounter (Signed)
Joy Moulding, NP seen patient on 08/29/2020 for an acute reason and prescribed rx for short term use, #14 with no refills. Please advise if rx can be provided for long term use.

## 2020-09-27 ENCOUNTER — Ambulatory Visit: Payer: Medicare Other

## 2020-10-01 ENCOUNTER — Other Ambulatory Visit (HOSPITAL_BASED_OUTPATIENT_CLINIC_OR_DEPARTMENT_OTHER): Payer: Self-pay

## 2020-10-01 ENCOUNTER — Ambulatory Visit: Payer: Medicare Other | Admitting: Adult Health

## 2020-10-01 ENCOUNTER — Ambulatory Visit: Payer: Medicare Other

## 2020-10-02 ENCOUNTER — Encounter: Payer: Self-pay | Admitting: Orthopedic Surgery

## 2020-10-02 ENCOUNTER — Other Ambulatory Visit: Payer: Self-pay | Admitting: Orthopedic Surgery

## 2020-10-02 NOTE — Telephone Encounter (Signed)
Thank you for letting [facility] know

## 2020-10-02 NOTE — Telephone Encounter (Signed)
Patient has an appointment tomorrow with Jaymes Graff  Forwarded message to [facility] and [name].

## 2020-10-03 ENCOUNTER — Ambulatory Visit: Payer: Medicare Other | Admitting: Adult Health

## 2020-10-08 ENCOUNTER — Ambulatory Visit: Payer: Medicare Other

## 2020-10-16 ENCOUNTER — Ambulatory Visit (HOSPITAL_COMMUNITY)
Admission: RE | Admit: 2020-10-16 | Discharge: 2020-10-16 | Disposition: A | Discharge status: Home / Self Care | Payer: Medicare Other | Source: Ambulatory Visit | Attending: Medical Oncology | Admitting: Medical Oncology

## 2020-10-16 ENCOUNTER — Encounter (HOSPITAL_COMMUNITY): Payer: Self-pay

## 2020-10-16 ENCOUNTER — Other Ambulatory Visit: Payer: Self-pay

## 2020-10-16 VITALS — BP 143/86 | HR 81 | Temp 98.2°F | Resp 17

## 2020-10-16 DIAGNOSIS — R350 Frequency of micturition: Secondary | ICD-10-CM | POA: Diagnosis not present

## 2020-10-16 DIAGNOSIS — Z23 Encounter for immunization: Secondary | ICD-10-CM | POA: Diagnosis not present

## 2020-10-16 DIAGNOSIS — E039 Hypothyroidism, unspecified: Secondary | ICD-10-CM

## 2020-10-16 DIAGNOSIS — K5900 Constipation, unspecified: Secondary | ICD-10-CM

## 2020-10-16 LAB — POCT URINALYSIS DIPSTICK, ED / UC
Bilirubin Urine: NEGATIVE
Glucose, UA: NEGATIVE mg/dL
Leukocytes,Ua: NEGATIVE
Nitrite: NEGATIVE
Protein, ur: NEGATIVE mg/dL
Specific Gravity, Urine: 1.02 (ref 1.005–1.030)
Urobilinogen, UA: 0.2 mg/dL (ref 0.0–1.0)
pH: 5.5 (ref 5.0–8.0)

## 2020-10-16 MED ORDER — DOCUSATE SODIUM 250 MG PO CAPS: 250.0000 mg | ORAL_CAPSULE | Freq: Every day | ORAL | 0 refills | Status: DC | PRN

## 2020-10-16 NOTE — ED Triage Notes (Signed)
Pt presents with urinary frequency X 2 weeks.  Caregiver states she has been feeling tired and has been hallucinating. She states she was put on different Thyroid medication that has been causing her to feel different.

## 2020-10-16 NOTE — ED Provider Notes (Signed)
[place]    CSN: [id number] Arrival date & time: 10/16/20  1716      History   Chief Complaint Chief Complaint  Patient presents with   Urinary Tract Infection    HPI Joy Ward is a 80 y.o. female.   HPI  Urinary frequency: Pt presents with her daughter. They report increased confusion, hallucination and urinary frequency over the past 2 weeks. They believe that symptoms are either caused by a thyroid medication adjustment or a UTI.  They report that her thyroid TSH levels are in the 40s and continue to rise.  They believes that she is not taking her medication on an empty stomach at her nursing home.  They are working to have this adjusted.  No vomiting, fevers, head injury.    Past Medical History:  Diagnosis Date   Allergy    Arthritis    Chronic back pain    DDD (degenerative disc disease), lumbar    Diabetes mellitus without complication ([place])    Diabetes type 2, uncontrolled ([place]) 09/18/2006   Annotation: Noninsulin dependent Qualifier: Diagnosis of  By: Amil Amen MD, Benjamine Mola     Former tobacco use    Hyperlipidemia    a. H/o muscle aches with statins per PCP notes.   Hypertension    Hypothyroidism    a. prior thyroid removal.   Memory loss or impairment 03/29/2019   Stroke [facility])    a. Listed in PCP notes: "History of stroke:  History of terrible headache.  Had a CT scan of brain and found "ministroke"  Was removed from ERT subsequently. "    Patient Active Problem List   Diagnosis Date Noted   Ataxia 04/07/2020   Weakness generalized 04/07/2020   Weakness 04/07/2020   Carpal tunnel syndrome of left wrist 02/20/2020   Diabetic polyneuropathy associated with type 2 diabetes mellitus ([place]) 02/20/2020   Lower GI bleed 07/31/2019   Syncope 07/30/2019   Pancytopenia ([place]) 07/30/2019   Hyperchloremia 78/29/5621   Metabolic acidosis 30/86/5784   Hypocalcemia 07/30/2019   Palliative care patient 07/03/2019   Hallucinations 69/62/9528    Metabolic encephalopathy 41/32/4401   Altered mental status    Recurrent UTI 04/30/2019   Vascular dementia with behavior disturbance ([place]) 04/14/2019   Primary insomnia 04/14/2019   Hypothyroidism 04/14/2019   Diabetes mellitus ([place]) 04/14/2019   Memory loss or impairment 03/29/2019   Diabetic peripheral neuropathy associated with type 2 diabetes mellitus ([place]) 12/29/2017   Peripheral edema 12/29/2017   Elevated liver enzymes 10/15/2016   Chest pain 02/72/5366   Systolic murmur 44/06/4740   Hypercalcemia 04/22/2016   Hyperlipidemia 04/22/2016   Baker's cyst of knee, right 04/21/2016   HYPOKALEMIA 01/24/2010   CONSTIPATION 12/23/2009   INTERMITTENT VERTIGO 12/23/2009   SINUSITIS, ACUTE 04/25/2009   VAGINITIS, CANDIDAL 02/14/2009   HIP PAIN, RIGHT 02/14/2009   HYPERLIPIDEMIA, MIXED 01/01/2009   BACK PAIN, LUMBAR, WITH RADICULOPATHY 12/25/2008   KNEE PAIN, BILATERAL 12/18/2008   TROCHANTERIC BURSITIS, BILATERAL 12/18/2008   Disturbance in sleep behavior 12/18/2008   ONYCHOMYCOSIS, TOENAILS 10/30/2008   ENTERITIS, CLOSTRIDIUM DIFFICILE 10/15/2006   Diabetes type 2, uncontrolled ([place]) 09/18/2006   Essential hypertension 09/18/2006   Allergic rhinitis 09/18/2006   GOITER, MULTINODULAR 06/29/2006   STROKE 04/27/2001    Past Surgical History:  Procedure Laterality Date   ABDOMINAL HYSTERECTOMY  1994   TAH, not clear if unilateral oophorectomy as well.   [place]   open  DECOMPRESSIVE LUMBAR LAMINECTOMY LEVEL 4  2005   In [place]  2012   multinodular goiter    OB History   No obstetric history on file.      Home Medications    Prior to Admission medications   Medication Sig Start Date End Date Taking? Authorizing Provider  acetaminophen (TYLENOL) 325 MG tablet Take 650 mg by mouth every 6 (six) hours as needed for mild pain (fever greater than or equal to 101 degrees).    [provider]   aluminum-magnesium hydroxide-simethicone (MAALOX) 960-454-09 MG/5ML SUSP Take 30 mLs by mouth every 6 (six) hours as needed (heartburn/indigestion).    [provider]  amLODipine (NORVASC) 5 MG tablet Take 1 tablet (5 mg total) by mouth daily after lunch. 08/29/20   Fargo, [name] E, NP  Calcium Carbonate-Vit D-Min (CALTRATE 600+D PLUS MINERALS) 600-800 MG-UNIT TABS Take 600 mg by mouth daily after lunch. 08/29/20   Fargo, [name] E, NP  diclofenac Sodium (VOLTAREN) 1 % GEL Apply 4 g topically 3 (three) times daily. To lower back, left hand, and fingers 05/29/20   Ngetich, Dinah C, NP  donepezil (ARICEPT) 10 MG tablet Take 1 tablet (10 mg total) by mouth at bedtime. 08/29/20   Fargo, [name] E, NP  furosemide (LASIX) 20 MG tablet Take 1 tablet (20 mg total) by mouth daily after lunch. 08/29/20   Fargo, [name] E, NP  HYDROcodone-acetaminophen (NORCO) 10-325 MG tablet Take 1 tablet by mouth See admin instructions. Take one tablet by mouth daily at 9am, 1pm, 5pm and 9pm    [provider]  levothyroxine (SYNTHROID) 112 MCG tablet Take 1 tablet (112 mcg total) by mouth daily before lunch. 08/29/20   Fargo, [name] E, NP  loratadine (ALLERGY RELIEF) 10 MG tablet Take 1 tablet (10 mg total) by mouth daily after lunch. 08/29/20   Fargo, [name] E, NP  metFORMIN (GLUCOPHAGE) 500 MG tablet Take 1 tablet (500 mg total) by mouth daily after lunch. 08/29/20   Fargo, [name] E, NP  Neomycin-Bacitracin-Polymyxin (TRIPLE ANTIBIOTIC) [phone] OINT Apply 1 application topically as needed (minor skin tears/abrasions).    [provider]  omeprazole (PRILOSEC) 40 MG capsule Take 1 capsule (40 mg total) by mouth daily after lunch. 08/29/20   Fargo, [name] E, NP  polyethylene glycol (MIRALAX / GLYCOLAX) 17 g packet take 17 grams BY MOUTH daily AFTER LUNCH 09/25/20   Wardell Honour, MD  potassium chloride SA (KLOR-CON) 20 MEQ tablet Take 1 tablet (20 mEq total) by mouth daily after lunch. 08/29/20   Yvonna Alanis, NP  QUEtiapine (SEROQUEL)  100 MG tablet TAKE 1 TABLET BY MOUTH AT BEDTIME 09/04/20   Wardell Honour, MD  traZODone (DESYREL) 100 MG tablet Take 1 tablet (100 mg total) by mouth at bedtime. for sleep 08/29/20   Yvonna Alanis, NP    Family History Family History  Problem Relation Age of Onset   Heart disease Mother        unclear details "atherosclerosis"   Diabetes Mother    Alcohol abuse Sister    Diabetes Brother    Diabetes Brother    Drug abuse Brother    Diabetes Daughter    Heart disease Son        CHF   Alcohol abuse Son     Social History Social History   Tobacco Use   Smoking status: Former    Pack years: 0.00   Smokeless tobacco: Never   Tobacco comments:  No smoking since 80 yo - smoked 10 yrs  Vaping Use   Vaping Use: Never used  Substance Use Topics   Alcohol use: No   Drug use: No     Allergies   Clindamycin hcl, Sulfamethoxazole-trimethoprim, Zanaflex [tizanidine hcl], Metronidazole, Statins, Bee venom, Ace inhibitors, and Adhesive [tape]   Review of Systems Review of Systems  As Stated above in HPI  Physical Exam Triage Vital Signs ED Triage Vitals  Enc Vitals Group     BP 10/16/20 1749 (!) 143/86     Pulse Rate 10/16/20 1749 81     Resp 10/16/20 1749 17     Temp 10/16/20 1749 98.2 F (36.8 C)     Temp Source 10/16/20 1749 Oral     SpO2 10/16/20 1749 96 %     Weight --      Height --      Head Circumference --      Peak Flow --      Pain Score 10/16/20 1747 3     Pain Loc --      Pain Edu? --      Excl. in [place]? --    No data found.  Updated Vital Signs BP (!) 143/86 (BP Location: Left Arm)   Pulse 81   Temp 98.2 F (36.8 C) (Oral)   Resp 17   SpO2 96%   Physical Exam Vitals and nursing note reviewed.  Constitutional:      General: She is not in acute distress.    Appearance: Normal appearance. She is not ill-appearing, toxic-appearing or diaphoretic.  HENT:     Head: Normocephalic and atraumatic.  Cardiovascular:     Rate and Rhythm: Normal rate  and regular rhythm.  Pulmonary:     Effort: Pulmonary effort is normal.     Breath sounds: Normal breath sounds.  Abdominal:     General: Bowel sounds are normal. There is no distension.     Palpations: Abdomen is soft. There is no mass.     Tenderness: There is no abdominal tenderness. There is no right CVA tenderness, left CVA tenderness, guarding or rebound.     Hernia: No hernia is present.  Musculoskeletal:     Cervical back: Neck supple.  Lymphadenopathy:     Cervical: No cervical adenopathy.  Skin:    General: Skin is warm.  Neurological:     Mental Status: She is alert and oriented to person, place, and time.  Psychiatric:        Behavior: Behavior normal.     UC Treatments / Results  Labs (all labs ordered are listed, but only abnormal results are displayed) Labs Reviewed  POCT URINALYSIS DIPSTICK, ED / UC - Abnormal; Notable for the following components:      Result Value   Ketones, ur TRACE (*)    Hgb urine dipstick TRACE (*)    All other components within normal limits  URINE CULTURE    EKG   Radiology No results found.  Procedures Procedures (including critical care time)  Medications Ordered in UC Medications - No data to display  Initial Impression / Assessment and Plan / UC Course  I have reviewed the triage vital signs and the nursing notes.  Pertinent labs & imaging results that were available during my care of the patient were reviewed by me and considered in my medical decision making (see chart for details).     New.  Patient was able to provide much of the history.  We discussed that her urinalysis does not show any sign of a urinary tract infection however I will culture to ensure that this is the case.  Should symptoms worsen in the meantime they will contact our office.  I believe that her constipation and firm stools are likely the cause of her urinary frequency as we discussed how this can work.  She will continue her MiraLAX which she  is she is supposed to take daily and I have sent in a stool softener.  She will work on hydration.  In addition I believe that her other symptoms are related to her hypothyroidism which is uncontrolled.  We spent time problem solving trying to come up with a different solution for her medication.  They will discuss with her primary care provider about adjusting to taking her medication after dinner as this is the longest gap between food and medicine that she has all day. Discussed red flag signs and symptoms.  Final Clinical Impressions(s) / UC Diagnoses   Final diagnoses:  None   Discharge Instructions   None    ED Prescriptions   None    PDMP not reviewed this encounter.   Hughie Closs, [place] 10/16/20 1841

## 2020-10-17 ENCOUNTER — Ambulatory Visit: Payer: Medicare Other | Admitting: Orthopedic Surgery

## 2020-10-18 LAB — URINE CULTURE: Culture: 80000 — AB

## 2020-10-20 ENCOUNTER — Emergency Department (HOSPITAL_BASED_OUTPATIENT_CLINIC_OR_DEPARTMENT_OTHER)
Admission: EM | Admit: 2020-10-20 | Discharge: 2020-10-20 | Disposition: A | Discharge status: Skilled Nursing Facility | Payer: Medicare Other | Attending: Emergency Medicine | Admitting: Emergency Medicine

## 2020-10-20 ENCOUNTER — Emergency Department (HOSPITAL_BASED_OUTPATIENT_CLINIC_OR_DEPARTMENT_OTHER): Payer: Medicare Other

## 2020-10-20 ENCOUNTER — Telehealth: Payer: Medicare Other | Admitting: Emergency Medicine

## 2020-10-20 ENCOUNTER — Emergency Department (HOSPITAL_BASED_OUTPATIENT_CLINIC_OR_DEPARTMENT_OTHER): Payer: Medicare Other | Admitting: Radiology

## 2020-10-20 ENCOUNTER — Ambulatory Visit: Payer: Medicare Other

## 2020-10-20 ENCOUNTER — Encounter (HOSPITAL_BASED_OUTPATIENT_CLINIC_OR_DEPARTMENT_OTHER): Payer: Self-pay

## 2020-10-20 DIAGNOSIS — Z87891 Personal history of nicotine dependence: Secondary | ICD-10-CM | POA: Insufficient documentation

## 2020-10-20 DIAGNOSIS — Z79899 Other long term (current) drug therapy: Secondary | ICD-10-CM | POA: Insufficient documentation

## 2020-10-20 DIAGNOSIS — R410 Disorientation, unspecified: Secondary | ICD-10-CM | POA: Diagnosis not present

## 2020-10-20 DIAGNOSIS — R5383 Other fatigue: Secondary | ICD-10-CM | POA: Diagnosis not present

## 2020-10-20 DIAGNOSIS — F0151 Vascular dementia with behavioral disturbance: Secondary | ICD-10-CM | POA: Insufficient documentation

## 2020-10-20 DIAGNOSIS — F05 Delirium due to known physiological condition: Secondary | ICD-10-CM

## 2020-10-20 DIAGNOSIS — R404 Transient alteration of awareness: Secondary | ICD-10-CM | POA: Diagnosis not present

## 2020-10-20 DIAGNOSIS — Z20822 Contact with and (suspected) exposure to covid-19: Secondary | ICD-10-CM | POA: Diagnosis not present

## 2020-10-20 DIAGNOSIS — R4182 Altered mental status, unspecified: Secondary | ICD-10-CM | POA: Diagnosis not present

## 2020-10-20 DIAGNOSIS — R451 Restlessness and agitation: Secondary | ICD-10-CM | POA: Insufficient documentation

## 2020-10-20 DIAGNOSIS — R5381 Other malaise: Secondary | ICD-10-CM | POA: Diagnosis not present

## 2020-10-20 DIAGNOSIS — R300 Dysuria: Secondary | ICD-10-CM | POA: Insufficient documentation

## 2020-10-20 DIAGNOSIS — R399 Unspecified symptoms and signs involving the genitourinary system: Secondary | ICD-10-CM

## 2020-10-20 DIAGNOSIS — R443 Hallucinations, unspecified: Secondary | ICD-10-CM | POA: Insufficient documentation

## 2020-10-20 DIAGNOSIS — E1142 Type 2 diabetes mellitus with diabetic polyneuropathy: Secondary | ICD-10-CM | POA: Diagnosis not present

## 2020-10-20 DIAGNOSIS — I10 Essential (primary) hypertension: Secondary | ICD-10-CM | POA: Insufficient documentation

## 2020-10-20 DIAGNOSIS — Z7984 Long term (current) use of oral hypoglycemic drugs: Secondary | ICD-10-CM | POA: Insufficient documentation

## 2020-10-20 DIAGNOSIS — Z7401 Bed confinement status: Secondary | ICD-10-CM | POA: Diagnosis not present

## 2020-10-20 DIAGNOSIS — R140 Abdominal distension (gaseous): Secondary | ICD-10-CM | POA: Insufficient documentation

## 2020-10-20 DIAGNOSIS — R1084 Generalized abdominal pain: Secondary | ICD-10-CM | POA: Diagnosis not present

## 2020-10-20 DIAGNOSIS — E039 Hypothyroidism, unspecified: Secondary | ICD-10-CM | POA: Insufficient documentation

## 2020-10-20 DIAGNOSIS — F5101 Primary insomnia: Secondary | ICD-10-CM | POA: Diagnosis not present

## 2020-10-20 LAB — CBC WITH DIFFERENTIAL/PLATELET
Abs Immature Granulocytes: 0.01 10*3/uL (ref 0.00–0.07)
Basophils Absolute: 0 10*3/uL (ref 0.0–0.1)
Basophils Relative: 1 %
Eosinophils Absolute: 0.2 10*3/uL (ref 0.0–0.5)
Eosinophils Relative: 5 %
HCT: 38.6 % (ref 36.0–46.0)
Hemoglobin: 12.7 g/dL (ref 12.0–15.0)
Immature Granulocytes: 0 %
Lymphocytes Relative: 36 %
Lymphs Abs: 1.2 10*3/uL (ref 0.7–4.0)
MCH: 29.7 pg (ref 26.0–34.0)
MCHC: 32.9 g/dL (ref 30.0–36.0)
MCV: 90.2 fL (ref 80.0–100.0)
Monocytes Absolute: 0.3 10*3/uL (ref 0.1–1.0)
Monocytes Relative: 10 %
Neutro Abs: 1.6 10*3/uL — ABNORMAL LOW (ref 1.7–7.7)
Neutrophils Relative %: 48 %
Platelets: 188 10*3/uL (ref 150–400)
RBC: 4.28 MIL/uL (ref 3.87–5.11)
RDW: 12.9 % (ref 11.5–15.5)
WBC: 3.3 10*3/uL — ABNORMAL LOW (ref 4.0–10.5)
nRBC: 0 % (ref 0.0–0.2)

## 2020-10-20 LAB — COMPREHENSIVE METABOLIC PANEL
ALT: 34 U/L (ref 0–44)
AST: 21 U/L (ref 15–41)
Albumin: 3.9 g/dL (ref 3.5–5.0)
Alkaline Phosphatase: 70 U/L (ref 38–126)
Anion gap: 7 (ref 5–15)
BUN: 8 mg/dL (ref 8–23)
CO2: 26 mmol/L (ref 22–32)
Calcium: 9.7 mg/dL (ref 8.9–10.3)
Chloride: 107 mmol/L (ref 98–111)
Creatinine, Ser: 0.62 mg/dL (ref 0.44–1.00)
GFR, Estimated: >60 mL/min (ref >60)
Glucose, Bld: 144 mg/dL — ABNORMAL HIGH (ref 70–99)
Potassium: 3.9 mmol/L (ref 3.5–5.1)
Sodium: 140 mmol/L (ref 135–145)
Total Bilirubin: 0.4 mg/dL (ref 0.3–1.2)
Total Protein: 6.6 g/dL (ref 6.5–8.1)

## 2020-10-20 LAB — TSH: TSH: 1.968 u[IU]/mL (ref 0.350–4.500)

## 2020-10-20 LAB — URINALYSIS, ROUTINE W REFLEX MICROSCOPIC
Bilirubin Urine: NEGATIVE
Glucose, UA: NEGATIVE mg/dL
Hgb urine dipstick: NEGATIVE
Ketones, ur: NEGATIVE mg/dL
Leukocytes,Ua: NEGATIVE
Nitrite: NEGATIVE
Protein, ur: NEGATIVE mg/dL
Specific Gravity, Urine: 1.022 (ref 1.005–1.030)
pH: 7 (ref 5.0–8.0)

## 2020-10-20 LAB — CBG MONITORING, ED: Glucose-Capillary: 149 mg/dL — ABNORMAL HIGH (ref 70–99)

## 2020-10-20 LAB — RESP PANEL BY RT-PCR (FLU A&B, COVID) ARPGX2
Influenza A by PCR: NEGATIVE
Influenza B by PCR: NEGATIVE
SARS Coronavirus 2 by RT PCR: NEGATIVE

## 2020-10-20 MED ORDER — TRAZODONE HCL 100 MG PO TABS: 100.0000 mg | ORAL_TABLET | Freq: Every day | ORAL | 0 refills | Status: DC

## 2020-10-20 MED ORDER — SODIUM CHLORIDE 0.9 % IV SOLN
1000.0000 mL | Freq: Once | INTRAVENOUS | Status: AC
  Administered 2020-10-20: 1000 mL via INTRAVENOUS

## 2020-10-20 NOTE — Discharge Instructions (Addendum)
Trazodone needs to be given every night NOT prn.

## 2020-10-20 NOTE — ED Notes (Signed)
I have called for PT[facility] transport back to her long term care facility [facility]).

## 2020-10-20 NOTE — ED Provider Notes (Signed)
[facility] EMERGENCY DEPT Provider Note   CSN: [id number] Arrival date & time: 10/20/20  1055     History Chief Complaint  Patient presents with   Urinary Tract Infection    Joy Ward is a 80 y.o. female.  Pt presents to the ED today with increased agitation.  She did go to the UC on 6/22.  She had a urine done which did not show a UTI.  They sent it for culture and it grew out lactobacillus.  No abx given.  She had a televisit today and was told to come here because she continues to have urinary sx and some hallucinations.      Past Medical History:  Diagnosis Date   Allergy    Arthritis    Chronic back pain    DDD (degenerative disc disease), lumbar    Diabetes mellitus without complication ([place])    Diabetes type 2, uncontrolled ([place]) 09/18/2006   Annotation: Noninsulin dependent Qualifier: Diagnosis of  By: Amil Amen MD, Benjamine Mola     Former tobacco use    Hyperlipidemia    a. H/o muscle aches with statins per PCP notes.   Hypertension    Hypothyroidism    a. prior thyroid removal.   Memory loss or impairment 03/29/2019   Stroke [facility])    a. Listed in PCP notes: "History of stroke:  History of terrible headache.  Had a CT scan of brain and found "ministroke"  Was removed from ERT subsequently. "    Patient Active Problem List   Diagnosis Date Noted   Ataxia 04/07/2020   Weakness generalized 04/07/2020   Weakness 04/07/2020   Carpal tunnel syndrome of left wrist 02/20/2020   Diabetic polyneuropathy associated with type 2 diabetes mellitus ([place]) 02/20/2020   Lower GI bleed 07/31/2019   Syncope 07/30/2019   Pancytopenia ([place]) 07/30/2019   Hyperchloremia 62/37/6283   Metabolic acidosis 15/17/6160   Hypocalcemia 07/30/2019   Palliative care patient 07/03/2019   Hallucinations 73/71/0626   Metabolic encephalopathy 94/85/4627   Altered mental status    Recurrent UTI 04/30/2019   Vascular dementia with behavior disturbance ([place]) 04/14/2019    Primary insomnia 04/14/2019   Hypothyroidism 04/14/2019   Diabetes mellitus ([place]) 04/14/2019   Memory loss or impairment 03/29/2019   Diabetic peripheral neuropathy associated with type 2 diabetes mellitus ([place]) 12/29/2017   Peripheral edema 12/29/2017   Elevated liver enzymes 10/15/2016   Chest pain 03/50/0938   Systolic murmur 18/29/9371   Hypercalcemia 04/22/2016   Hyperlipidemia 04/22/2016   Baker's cyst of knee, right 04/21/2016   HYPOKALEMIA 01/24/2010   CONSTIPATION 12/23/2009   INTERMITTENT VERTIGO 12/23/2009   SINUSITIS, ACUTE 04/25/2009   VAGINITIS, CANDIDAL 02/14/2009   HIP PAIN, RIGHT 02/14/2009   HYPERLIPIDEMIA, MIXED 01/01/2009   BACK PAIN, LUMBAR, WITH RADICULOPATHY 12/25/2008   KNEE PAIN, BILATERAL 12/18/2008   TROCHANTERIC BURSITIS, BILATERAL 12/18/2008   Disturbance in sleep behavior 12/18/2008   ONYCHOMYCOSIS, TOENAILS 10/30/2008   ENTERITIS, CLOSTRIDIUM DIFFICILE 10/15/2006   Diabetes type 2, uncontrolled ([place]) 09/18/2006   Essential hypertension 09/18/2006   Allergic rhinitis 09/18/2006   GOITER, MULTINODULAR 06/29/2006   STROKE 04/27/2001    Past Surgical History:  Procedure Laterality Date   ABDOMINAL HYSTERECTOMY  1994   TAH, not clear if unilateral oophorectomy as well.   [place]   open   [place] LEVEL 4  2005   In [place]  2012   multinodular goiter  OB History   No obstetric history on file.     Family History  Problem Relation Age of Onset   Heart disease Mother        unclear details "atherosclerosis"   Diabetes Mother    Alcohol abuse Sister    Diabetes Brother    Diabetes Brother    Drug abuse Brother    Diabetes Daughter    Heart disease Son        CHF   Alcohol abuse Son     Social History   Tobacco Use   Smoking status: Former    Pack years: 0.00   Smokeless tobacco: Never   Tobacco comments:    No smoking since 80  yo - smoked 10 yrs  Vaping Use   Vaping Use: Never used  Substance Use Topics   Alcohol use: No   Drug use: No    Home Medications Prior to Admission medications   Medication Sig Start Date End Date Taking? Authorizing Provider  acetaminophen (TYLENOL) 325 MG tablet Take 650 mg by mouth every 6 (six) hours as needed for mild pain (fever greater than or equal to 101 degrees).    [provider]  aluminum-magnesium hydroxide-simethicone (MAALOX) 093-818-29 MG/5ML SUSP Take 30 mLs by mouth every 6 (six) hours as needed (heartburn/indigestion).    [provider]  amLODipine (NORVASC) 5 MG tablet Take 1 tablet (5 mg total) by mouth daily after lunch. 08/29/20   Fargo, [name] E, NP  Calcium Carbonate-Vit D-Min (CALTRATE 600+D PLUS MINERALS) 600-800 MG-UNIT TABS Take 600 mg by mouth daily after lunch. 08/29/20   Fargo, [name] E, NP  diclofenac Sodium (VOLTAREN) 1 % GEL Apply 4 g topically 3 (three) times daily. To lower back, left hand, and fingers 05/29/20   Ngetich, Dinah C, NP  docusate sodium (COLACE) 250 MG capsule Take 1 capsule (250 mg total) by mouth daily as needed for constipation. 10/16/20   Hughie Closs, PA-C  donepezil (ARICEPT) 10 MG tablet Take 1 tablet (10 mg total) by mouth at bedtime. 08/29/20   Fargo, [name] E, NP  furosemide (LASIX) 20 MG tablet Take 1 tablet (20 mg total) by mouth daily after lunch. 08/29/20   Fargo, [name] E, NP  HYDROcodone-acetaminophen (NORCO) 10-325 MG tablet Take 1 tablet by mouth See admin instructions. Take one tablet by mouth daily at 9am, 1pm, 5pm and 9pm    [provider]  levothyroxine (SYNTHROID) 112 MCG tablet Take 1 tablet (112 mcg total) by mouth daily before lunch. 08/29/20   Fargo, [name] E, NP  loratadine (ALLERGY RELIEF) 10 MG tablet Take 1 tablet (10 mg total) by mouth daily after lunch. 08/29/20   Fargo, [name] E, NP  metFORMIN (GLUCOPHAGE) 500 MG tablet Take 1 tablet (500 mg total) by mouth daily after lunch. 08/29/20   Fargo, [name] E, NP   Neomycin-Bacitracin-Polymyxin (TRIPLE ANTIBIOTIC) [phone] OINT Apply 1 application topically as needed (minor skin tears/abrasions).    [provider]  omeprazole (PRILOSEC) 40 MG capsule Take 1 capsule (40 mg total) by mouth daily after lunch. 08/29/20   Fargo, [name] E, NP  polyethylene glycol (MIRALAX / GLYCOLAX) 17 g packet take 17 grams BY MOUTH daily AFTER LUNCH 09/25/20   Wardell Honour, MD  potassium chloride SA (KLOR-CON) 20 MEQ tablet Take 1 tablet (20 mEq total) by mouth daily after lunch. 08/29/20   Fargo, [name] E, NP  QUEtiapine (SEROQUEL) 100 MG tablet TAKE 1 TABLET BY MOUTH AT BEDTIME 09/04/20  Wardell Honour, MD  traZODone (DESYREL) 100 MG tablet Take 1 tablet (100 mg total) by mouth at bedtime. for sleep 10/20/20   Isla Pence, MD    Allergies    Clindamycin hcl, Sulfamethoxazole-trimethoprim, Zanaflex [tizanidine hcl], Metronidazole, Statins, Bee venom, Ace inhibitors, and Adhesive [tape]  Review of Systems   Review of Systems  Genitourinary:  Positive for dysuria.  Psychiatric/Behavioral:  Positive for agitation.   All other systems reviewed and are negative.  Physical Exam Updated Vital Signs BP (!) 155/82   Pulse 73   Temp 98.2 F (36.8 C) (Oral)   Resp 20   SpO2 100%   Physical Exam Vitals and nursing note reviewed.  Constitutional:      Appearance: Normal appearance.  HENT:     Head: Normocephalic and atraumatic.     Right Ear: External ear normal.     Left Ear: External ear normal.     Nose: Nose normal.     Mouth/Throat:     Mouth: Mucous membranes are moist.     Pharynx: Oropharynx is clear.  Eyes:     Extraocular Movements: Extraocular movements intact.     Conjunctiva/sclera: Conjunctivae normal.     Pupils: Pupils are equal, round, and reactive to light.  Cardiovascular:     Rate and Rhythm: Normal rate and regular rhythm.     Pulses: Normal pulses.     Heart sounds: Normal heart sounds.  Pulmonary:     Effort: Pulmonary  effort is normal.     Breath sounds: Normal breath sounds.  Abdominal:     General: Abdomen is flat. Bowel sounds are normal.     Palpations: Abdomen is soft.  Musculoskeletal:        General: Normal range of motion.     Cervical back: Normal range of motion and neck supple.  Skin:    General: Skin is warm.     Capillary Refill: Capillary refill takes less than 2 seconds.  Neurological:     General: No focal deficit present.     Mental Status: She is alert. Mental status is at baseline.     Comments: Pt knows her name.  She knows she is in a hospital and that her birthday is soon.  Psychiatric:        Mood and Affect: Mood normal.        Behavior: Behavior normal.    ED Results / Procedures / Treatments   Labs (all labs ordered are listed, but only abnormal results are displayed) Labs Reviewed  CBC WITH DIFFERENTIAL/PLATELET - Abnormal; Notable for the following components:      Result Value   WBC 3.3 (*)    Neutro Abs 1.6 (*)    All other components within normal limits  COMPREHENSIVE METABOLIC PANEL - Abnormal; Notable for the following components:   Glucose, Bld 144 (*)    All other components within normal limits  CBG MONITORING, ED - Abnormal; Notable for the following components:   Glucose-Capillary 149 (*)    All other components within normal limits  RESP PANEL BY RT-PCR (FLU A&B, COVID) [id number]  URINE CULTURE  URINALYSIS, ROUTINE W REFLEX MICROSCOPIC  TSH    EKG EKG Interpretation  Date/Time:  Sunday October 20 2020 11:25:13 EDT Ventricular Rate:  68 PR Interval:  249 QRS Duration: 96 QT Interval:  402 QTC Calculation: 428 R Axis:   33 Text Interpretation: Sinus rhythm Prolonged PR interval Low voltage, precordial leads Borderline T abnormalities, anterior leads No  significant change since last tracing Confirmed by Isla Pence [phone]) on 10/20/2020 11:30:07 AM  Radiology DG Chest 2 View  Result Date: 10/20/2020 CLINICAL DATA:  Altered level  consciousness EXAM: CHEST - 2 VIEW COMPARISON:  04/07/2020 FINDINGS: The heart size and mediastinal contours are within normal limits. Both lungs are clear. Disc degenerative disease of the thoracic spine. IMPRESSION: No acute abnormality of the lungs. Electronically Signed   By: Eddie Candle M.D.   On: 10/20/2020 12:22   CT Head Wo Contrast  Result Date: 10/20/2020 CLINICAL DATA:  Per ED notes: She comes to [place] from [facility]. Staff there told EMS that pt. Had recent u.t.I. they sent pt. To be seen because she "was more active and agitated than usual". She arrives alert and somewhat confused. Her speech is clear and she can tell me she is at a hospital and is able to tell me her name and date of birth and the name of her daughter. She cannot recall the name of the antibiotic she may or may not be on. EXAM: CT HEAD WITHOUT CONTRAST TECHNIQUE: Contiguous axial images were obtained from the base of the skull through the vertex without intravenous contrast. COMPARISON:  04/07/2020. FINDINGS: Brain: No evidence of acute infarction, hemorrhage, hydrocephalus, extra-axial collection or mass lesion/mass effect. Ventricular enlargement consistent mild atrophy. Patchy white matter hypoattenuation is noted bilaterally consistent with advanced chronic microvascular ischemic change. These findings are stable. Vascular: No hyperdense vessel or unexpected calcification. Skull: Normal. Negative for fracture or focal lesion. Sinuses/Orbits: Visualized globes and orbits are unremarkable. Visualized sinuses are clear. Other: None. IMPRESSION: 1. No acute intracranial abnormalities. 2. Mild atrophy and advanced chronic microvascular ischemic change stable from the prior head CT. Electronically Signed   By: Lajean Manes M.D.   On: 10/20/2020 11:48    Procedures Procedures   Medications Ordered in ED Medications  0.9 %  sodium chloride infusion (1,000 mLs Intravenous New Bag/Given 10/20/20 1205)    ED Course  I have  reviewed the triage vital signs and the nursing notes.  Pertinent labs & imaging results that were available during my care of the patient were reviewed by me and considered in my medical decision making (see chart for details).    MDM Rules/Calculators/A&P                          Pt's work up is negative.  Sx likely due to sundowning.  Pt is supposed to get trazodone every night, not prn.  SNF is giving it to her prn.  SNF instructed to given every night.  Pt has an appt with pcp on Thursday, 6/30.  Pt d/w her daughter.  Pt stable for d/c.  Return if worse. Final Clinical Impression(s) / ED Diagnoses Final diagnoses:  Sundowning  Primary insomnia    Rx / DC Orders ED Discharge Orders          Ordered    traZODone (DESYREL) 100 MG tablet  Daily at bedtime        10/20/20 1425             Isla Pence, MD 10/20/20 1431

## 2020-10-20 NOTE — ED Notes (Signed)
[name] [place] [phone] (daughter).

## 2020-10-20 NOTE — ED Notes (Addendum)
Patient became upset when PTAR arrived to take her back to [facility].  States she wants to go to [place].  Explained to patient that she is staying at [facility]. As Medic was pushing the stretcher out of the room, patient grabbed the door and broke her fingernail on the right hand.  No injury to finger.  Patient became agitated and wanted to spea Patient wanted speak  to her daughter.  Call to daughter [name]) was placed and patient was able to talk to her and has agreed to go with [facility] to [facility].  Patient calm and agreeable to going back to [facility].

## 2020-10-20 NOTE — ED Triage Notes (Signed)
She comes to [place] from [facility]. Staff there told EMS that pt. Had recent u.t.I. they sent pt. To be seen because she "was more active and agitated than usual". She arrives alert and somewhat confused. Her speech is clear and she can tell me she is at a hospital and is able to tell me her name and date of birth and the name of her daughter. She cannot recall the name of the antibiotic she may or may not be on.

## 2020-10-20 NOTE — Progress Notes (Signed)
Based on what you shared with me, with urinary symptoms for over a week resulting in back pain and hallucinations, I feel your condition warrants further evaluation as soon as possible at an Emergency department. There is concern you may have a very severe infection that requires a larger workup and treatment.    NOTE: There will be NO CHARGE for this eVisit   If you are having a true medical emergency please call 911.      Emergency [facility]  Get Driving Directions  [phone]  [address]  [place], [place] [address]  Open 24/7/365       [facility] Emergency Department at [place]  [address]  [place], [place] [address]  Open 24/7/365    Emergency [facility]  Get Driving Directions  [phone]  [address], [place] [address]  Open 24/7/365      [facility] at [facility]  Get Driving Directions  [phone]  [address]  [place], [place] [address]  Open 24/7/365    [facility]  Emergency [place]  Get Driving Directions  [phone]  [place], [place] [address]  Open 24/7/365    [facility]  Emergency Department- [place]  Get Driving Directions  [address]  [place], [place] [address]  Open 24/7/365    [facility]  Emergency [facility]  Get Driving Directions  [phone]  [address]  [place], [place] [address]  Open 24/7/365   Approximately 5 minutes was spent documenting and reviewing patient's chart.

## 2020-10-21 ENCOUNTER — Other Ambulatory Visit: Payer: Self-pay | Admitting: Orthopedic Surgery

## 2020-10-21 DIAGNOSIS — E1169 Type 2 diabetes mellitus with other specified complication: Secondary | ICD-10-CM

## 2020-10-21 LAB — URINE CULTURE: Culture: NO GROWTH

## 2020-10-24 ENCOUNTER — Other Ambulatory Visit: Payer: Self-pay

## 2020-10-24 ENCOUNTER — Ambulatory Visit (INDEPENDENT_AMBULATORY_CARE_PROVIDER_SITE_OTHER): Payer: Medicare Other | Admitting: Orthopedic Surgery

## 2020-10-24 ENCOUNTER — Encounter: Payer: Self-pay | Admitting: Orthopedic Surgery

## 2020-10-24 VITALS — BP 140/88 | HR 80 | Temp 97.1°F | Ht 64.0 in | Wt 161.8 lb

## 2020-10-24 DIAGNOSIS — E039 Hypothyroidism, unspecified: Secondary | ICD-10-CM

## 2020-10-24 DIAGNOSIS — K219 Gastro-esophageal reflux disease without esophagitis: Secondary | ICD-10-CM

## 2020-10-24 DIAGNOSIS — E1169 Type 2 diabetes mellitus with other specified complication: Secondary | ICD-10-CM | POA: Diagnosis not present

## 2020-10-24 DIAGNOSIS — F01518 Vascular dementia, unspecified severity, with other behavioral disturbance: Secondary | ICD-10-CM

## 2020-10-24 DIAGNOSIS — R443 Hallucinations, unspecified: Secondary | ICD-10-CM

## 2020-10-24 DIAGNOSIS — K5901 Slow transit constipation: Secondary | ICD-10-CM | POA: Diagnosis not present

## 2020-10-24 DIAGNOSIS — F0151 Vascular dementia with behavioral disturbance: Secondary | ICD-10-CM

## 2020-10-24 MED ORDER — PANTOPRAZOLE SODIUM 40 MG PO TBEC: 40.0000 mg | DELAYED_RELEASE_TABLET | Freq: Every day | ORAL | 3 refills | Status: DC

## 2020-10-24 MED ORDER — POLYETHYLENE GLYCOL 3350 17 GM/SCOOP PO POWD: 17.0000 g | Freq: Two times a day (BID) | ORAL | 1 refills | Status: DC

## 2020-10-24 NOTE — Patient Instructions (Signed)
NURSING STAFF AT [facility] GIVE NIGHT MEDICATIONS AND WATCH PATIENT SWALLOW MEDICATIONS. IF SHE WILL NOT TAKE MEDICATIONS PLEASE CALL DAUGHTER TO NOTIFY.

## 2020-10-24 NOTE — Progress Notes (Signed)
Careteam: Patient Care Team: Wardell Honour, MD as PCP - General (Family Medicine)  Seen by: Windell Moulding, AGNP-C  PLACE OF SERVICE:  [place]  Advanced Directive information    Allergies  Allergen Reactions   Clindamycin Hcl Other (See Comments)    REACTION: C. difficile colitis   Sulfamethoxazole-Trimethoprim Other (See Comments)     Eye redness   Zanaflex [Tizanidine Hcl] Anaphylaxis   Metronidazole Hives   Statins Other (See Comments)    Side Effect: muscle pain Has tried Zocor, Crestor--she does not want to take a statin, period   Bee Venom Other (See Comments)    Unknown reaction   Ace Inhibitors Cough   Adhesive [Tape] Itching, Rash and Other (See Comments)    Blistering of skin     Chief Complaint  Patient presents with   Medical Management of Chronic Issues    Patient presents today for hypothyroidism.     HPI: Patient is a 80 y.o. female seen today for acute visit for increased agitation at night.   Daughter present for encounter.   Daughter reports her mother has had increased confusion and hallucinations at night. She was taken to [facility] urgent care 06/22 for urinary frequency. UA did not indicate infection, culture grew lactobacillus. Advised to continue miralax for constipation and stool softener.   06/26 she was taken to [facility] ED due to increased agitation. Workup negative, suspected sundowning. Second urine culture negative, CBC/diff and cmp unremarkable.  CT head with no acute intracranial abnormalities, advanced chronic microvascular ischemic changes noted. CXR unremarkable. Daughter unsure if she is actually taking medications in the evening. Reports staff giving her medication, but unsure if she is observed taking them by staff. She has found medication in her room when she comes to visit.   TSH rechecked in ED, 1.968.   No recent falls, injuries.    Review of Systems:  Review of Systems  Constitutional:  Negative for chills,  fever, malaise/fatigue and weight loss.  HENT: Negative.    Eyes: Negative.   Respiratory:  Negative for cough, shortness of breath and wheezing.   Cardiovascular:  Negative for chest pain and leg swelling.  Gastrointestinal:  Positive for constipation.  Genitourinary:  Positive for frequency.  Musculoskeletal:  Negative for falls.  Skin: Negative.   Neurological: Negative.   Endo/Heme/Allergies: Negative.   Psychiatric/Behavioral:  Positive for hallucinations and memory loss. The patient has insomnia.    Past Medical History:  Diagnosis Date   Allergy    Arthritis    Chronic back pain    DDD (degenerative disc disease), lumbar    Diabetes mellitus without complication ([place])    Diabetes type 2, uncontrolled ([place]) 09/18/2006   Annotation: Noninsulin dependent Qualifier: Diagnosis of  By: Amil Amen MD, Benjamine Mola     Former tobacco use    Hyperlipidemia    a. H/o muscle aches with statins per PCP notes.   Hypertension    Hypothyroidism    a. prior thyroid removal.   Memory loss or impairment 03/29/2019   Stroke [facility])    a. Listed in PCP notes: "History of stroke:  History of terrible headache.  Had a CT scan of brain and found "ministroke"  Was removed from ERT subsequently. "   Past Surgical History:  Procedure Laterality Date   ABDOMINAL HYSTERECTOMY  1994   TAH, not clear if unilateral oophorectomy as well.   [place] [facility]   open  DECOMPRESSIVE LUMBAR LAMINECTOMY LEVEL 4  2005   In [place]  2012   multinodular goiter   Social History:   reports that she has quit smoking. She has never used smokeless tobacco. She reports that she does not drink alcohol and does not use drugs.  Family History  Problem Relation Age of Onset   Heart disease Mother        unclear details "atherosclerosis"   Diabetes Mother    Alcohol abuse Sister    Diabetes Brother    Diabetes Brother    Drug abuse Brother    Diabetes  Daughter    Heart disease Son        CHF   Alcohol abuse Son     Medications: Patient's Medications  New Prescriptions   No medications on file  Previous Medications   ACETAMINOPHEN (TYLENOL) 325 MG TABLET    Take 650 mg by mouth every 6 (six) hours as needed for mild pain (fever greater than or equal to 101 degrees).   ALUMINUM-MAGNESIUM HYDROXIDE-SIMETHICONE (MAALOX) 161-096-04 MG/5ML SUSP    Take 30 mLs by mouth every 6 (six) hours as needed (heartburn/indigestion).   AMLODIPINE (NORVASC) 5 MG TABLET    Take 1 tablet (5 mg total) by mouth daily after lunch.   CALCIUM CARBONATE-VIT D-MIN (CALTRATE 600+D PLUS MINERALS) 600-800 MG-UNIT TABS    Take 600 mg by mouth daily after lunch.   DICLOFENAC SODIUM (VOLTAREN) 1 % GEL    Apply 4 g topically 3 (three) times daily. To lower back, left hand, and fingers   DOCUSATE SODIUM (COLACE) 250 MG CAPSULE    Take 1 capsule (250 mg total) by mouth daily as needed for constipation.   DONEPEZIL (ARICEPT) 10 MG TABLET    Take 1 tablet (10 mg total) by mouth at bedtime.   FUROSEMIDE (LASIX) 20 MG TABLET    Take 1 tablet (20 mg total) by mouth daily after lunch.   HYDROCODONE-ACETAMINOPHEN (NORCO) 10-325 MG TABLET    Take 1 tablet by mouth See admin instructions. Take one tablet by mouth daily at 9am, 1pm, 5pm and 9pm   LEVOTHYROXINE (SYNTHROID) 112 MCG TABLET    Take 1 tablet (112 mcg total) by mouth daily before lunch.   LORATADINE (ALLERGY RELIEF) 10 MG TABLET    Take 1 tablet (10 mg total) by mouth daily after lunch.   METFORMIN (GLUCOPHAGE) 500 MG TABLET    TAKE 1 TABLET BY MOUTH EVERY DAY AFTER LUNCH   NEOMYCIN-BACITRACIN-POLYMYXIN (TRIPLE ANTIBIOTIC) [phone] OINT    Apply 1 application topically as needed (minor skin tears/abrasions).   OMEPRAZOLE (PRILOSEC) 40 MG CAPSULE    Take 1 capsule (40 mg total) by mouth daily after lunch.   POLYETHYLENE GLYCOL (MIRALAX / GLYCOLAX) 17 G PACKET    take 17 grams BY MOUTH daily AFTER LUNCH   POTASSIUM  CHLORIDE SA (KLOR-CON) 20 MEQ TABLET    Take 1 tablet (20 mEq total) by mouth daily after lunch.   QUETIAPINE (SEROQUEL) 100 MG TABLET    TAKE 1 TABLET BY MOUTH AT BEDTIME   TRAZODONE (DESYREL) 100 MG TABLET    Take 1 tablet (100 mg total) by mouth at bedtime. for sleep  Modified Medications   No medications on file  Discontinued Medications   No medications on file    Physical Exam:  Vitals:   10/24/20 1528  BP: 140/88  Pulse: 80  Temp: (!) 97.1 F (36.2 C)  TempSrc: Temporal  SpO2: 98%  Weight: 161 lb 12.8 oz (73.4 kg)  Height: 5\' 4"  (1.626 m)   Body mass index is 27.77 kg/m. Wt Readings from Last 3 Encounters:  10/24/20 161 lb 12.8 oz (73.4 kg)  08/29/20 163 lb (73.9 kg)  07/05/20 158 lb (71.7 kg)    Physical Exam Vitals reviewed.  Constitutional:      General: She is not in acute distress. HENT:     Head: Normocephalic.  Neck:     Vascular: No carotid bruit.  Cardiovascular:     Rate and Rhythm: Normal rate and regular rhythm.     Pulses: Normal pulses.     Heart sounds: Normal heart sounds.  Pulmonary:     Effort: Pulmonary effort is normal. No respiratory distress.     Breath sounds: Normal breath sounds. No wheezing.  Abdominal:     General: Bowel sounds are normal. There is no distension.     Palpations: Abdomen is soft.     Tenderness: There is no abdominal tenderness.  Musculoskeletal:     Cervical back: Normal range of motion.     Right lower leg: No edema.     Left lower leg: No edema.  Lymphadenopathy:     Cervical: No cervical adenopathy.  Skin:    General: Skin is warm and dry.     Capillary Refill: Capillary refill takes less than 2 seconds.  Neurological:     General: No focal deficit present.     Mental Status: She is alert. Mental status is at baseline.     Motor: No weakness.     Gait: Gait normal.  Psychiatric:        Mood and Affect: Mood normal.        Behavior: Behavior normal.    Labs reviewed: Basic Metabolic  Panel: Recent Labs    04/08/20 0240 05/29/20 1422 08/29/20 1627 10/20/20 1203  NA 136  --  139 140  K 3.6  --  3.8 3.9  CL 104  --  105 107  CO2 24  --  25 26  GLUCOSE 144*  --  135 144*  BUN 13  --  11 8  CREATININE 0.75  --  0.67 0.62  CALCIUM 8.7*  --  9.5 9.7  TSH  --  45.03* 48.32* 1.968   Liver Function Tests: Recent Labs    04/07/20 1638 08/29/20 1627 10/20/20 1203  AST 26 13 21   ALT 24 8 34  ALKPHOS 74  --  70  BILITOT 0.7 0.4 0.4  PROT 6.7 6.7 6.6  ALBUMIN 3.8  --  3.9   Recent Labs    04/07/20 1638  LIPASE 30   No results for input(s): AMMONIA in the last 8760 hours. CBC: Recent Labs    04/07/20 1638 04/08/20 0315 10/20/20 1203  WBC 4.3 4.2 3.3*  NEUTROABS 2.1  --  1.6*  HGB 12.2 11.6* 12.7  HCT 36.0 34.7* 38.6  MCV 91.1 90.8 90.2  PLT 185 162 188   Lipid Panel: No results for input(s): CHOL, HDL, LDLCALC, TRIG, CHOLHDL, LDLDIRECT in the last 8760 hours. TSH: Recent Labs    05/29/20 1422 08/29/20 1627 10/20/20 1203  TSH 45.03* 48.32* 1.968   A1C: Lab Results  Component Value Date   HGBA1C 6.5 (H) 09/06/2019     Assessment/Plan 1. Slow transit constipation - abdomen soft, normal BS x 4 - cont colace prn - encourage hydration with water - recommend eating prunes - polyethylene glycol powder (GLYCOLAX/MIRALAX) 17 GM/SCOOP powder;  Take 17 g by mouth 2 (two) times daily.  Dispense: 3350 g; Refill: 1  2. Gastroesophageal reflux disease without esophagitis - does not like to swallow capsules - will change to protonix for compliance - pantoprazole (PROTONIX) 40 MG tablet; Take 1 tablet (40 mg total) by mouth daily.  Dispense: 30 tablet; Refill: 3  3. Type 2 diabetes mellitus with other specified complication, without long-term current use of insulin (HCC) - Hemoglobin A1c- 6.5 10/24/2020 - cont metformin 500 mg daily - cont norvasc 5 mg daily - urine microalbumin- future - foot exam- future - eye exam- future  4. Acquired  hypothyroidism - TSH 1.9 10/20/2020 - cont synthroid 112 mcg every AM on empty stomach  5. Vascular dementia with behavior disturbance ([place]) - ongoing, recent sundowning and hallucinations at night - ED visit unremarkable - question if normal TSH level is making her more active - daughter reports [facility] not watching mother take medication, seen pills around room during visits - reported to staff to observe patient take medications every night - cont Aricept 10 mg qhs - cont Seroquel 100 mg qhs - cont trazodone 100 mg qhs  6. Hallucinations - see above  Total Time: 35 minutes. Greater than 50% of total time spent doing patient education on falls safety and medication management regarding advanced dementia and hallucinations.    Next appt: 12/12/2020  Windell Moulding, [facility] [phone]

## 2020-10-25 LAB — HEMOGLOBIN A1C
Hgb A1c MFr Bld: 6.5 % of total Hgb — ABNORMAL HIGH (ref <5.7)
Mean Plasma Glucose: 140 mg/dL
eAG (mmol/L): 7.7 mmol/L

## 2020-11-09 DIAGNOSIS — M5416 Radiculopathy, lumbar region: Secondary | ICD-10-CM | POA: Diagnosis not present

## 2020-11-20 DIAGNOSIS — Z20828 Contact with and (suspected) exposure to other viral communicable diseases: Secondary | ICD-10-CM | POA: Diagnosis not present

## 2020-11-22 DIAGNOSIS — M25812 Other specified joint disorders, left shoulder: Secondary | ICD-10-CM | POA: Diagnosis not present

## 2020-11-22 DIAGNOSIS — M25512 Pain in left shoulder: Secondary | ICD-10-CM | POA: Diagnosis not present

## 2020-11-22 DIAGNOSIS — M19011 Primary osteoarthritis, right shoulder: Secondary | ICD-10-CM | POA: Diagnosis not present

## 2020-11-22 DIAGNOSIS — M25511 Pain in right shoulder: Secondary | ICD-10-CM | POA: Diagnosis not present

## 2020-11-27 ENCOUNTER — Other Ambulatory Visit: Payer: Self-pay

## 2020-11-27 MED ORDER — HYDROCODONE-ACETAMINOPHEN 10-325 MG PO TABS: 1.0000 | ORAL_TABLET | ORAL | 0 refills | Status: DC

## 2020-11-27 NOTE — Telephone Encounter (Signed)
Per [facility] manager patient does not see pain management she currently only comes to [facility].

## 2020-11-27 NOTE — Telephone Encounter (Signed)
Please verify with patient whether she follows up with pain management medication not filled from [facility] ([facility]

## 2020-11-27 NOTE — Telephone Encounter (Signed)
Joy Ward w/ [facility] called requesting a refill request for patient's Norco 10-325 MG to be send to [facility]. Medication never filled by any provider in the office, please advise.

## 2020-12-02 ENCOUNTER — Other Ambulatory Visit: Payer: Self-pay | Admitting: Orthopedic Surgery

## 2020-12-02 ENCOUNTER — Other Ambulatory Visit: Payer: Self-pay | Admitting: Family Medicine

## 2020-12-02 DIAGNOSIS — F01518 Vascular dementia, unspecified severity, with other behavioral disturbance: Secondary | ICD-10-CM

## 2020-12-02 DIAGNOSIS — J3089 Other allergic rhinitis: Secondary | ICD-10-CM

## 2020-12-02 DIAGNOSIS — E039 Hypothyroidism, unspecified: Secondary | ICD-10-CM

## 2020-12-02 DIAGNOSIS — F0151 Vascular dementia with behavioral disturbance: Secondary | ICD-10-CM

## 2020-12-02 DIAGNOSIS — E1169 Type 2 diabetes mellitus with other specified complication: Secondary | ICD-10-CM

## 2020-12-12 ENCOUNTER — Encounter: Payer: Self-pay | Admitting: Orthopedic Surgery

## 2020-12-12 ENCOUNTER — Ambulatory Visit (INDEPENDENT_AMBULATORY_CARE_PROVIDER_SITE_OTHER): Payer: Medicare Other | Admitting: Orthopedic Surgery

## 2020-12-12 VITALS — BP 130/80 | HR 80 | Temp 97.1°F | Ht 64.0 in | Wt 168.8 lb

## 2020-12-12 DIAGNOSIS — I10 Essential (primary) hypertension: Secondary | ICD-10-CM | POA: Diagnosis not present

## 2020-12-12 DIAGNOSIS — L602 Onychogryphosis: Secondary | ICD-10-CM

## 2020-12-12 DIAGNOSIS — F0151 Vascular dementia with behavioral disturbance: Secondary | ICD-10-CM | POA: Diagnosis not present

## 2020-12-12 DIAGNOSIS — Z23 Encounter for immunization: Secondary | ICD-10-CM | POA: Diagnosis not present

## 2020-12-12 DIAGNOSIS — E1169 Type 2 diabetes mellitus with other specified complication: Secondary | ICD-10-CM | POA: Diagnosis not present

## 2020-12-12 DIAGNOSIS — F01518 Vascular dementia, unspecified severity, with other behavioral disturbance: Secondary | ICD-10-CM

## 2020-12-12 DIAGNOSIS — L601 Onycholysis: Secondary | ICD-10-CM

## 2020-12-12 DIAGNOSIS — E039 Hypothyroidism, unspecified: Secondary | ICD-10-CM | POA: Diagnosis not present

## 2020-12-12 NOTE — Patient Instructions (Addendum)
Please get flu vaccine before October 31st 2022.   Please schedule follow up with eye doctor.   Please have [facility] fax over medication orders with list of times they were given.

## 2020-12-12 NOTE — Progress Notes (Signed)
Careteam: Patient Care Team: Wardell Honour, MD as PCP - General (Family Medicine)  Seen by: Windell Moulding, AGNP-C  PLACE OF SERVICE:  [place]  Advanced Directive information    Allergies  Allergen Reactions   Clindamycin Hcl Other (See Comments)    REACTION: C. difficile colitis   Sulfamethoxazole-Trimethoprim Other (See Comments)     Eye redness   Zanaflex [Tizanidine Hcl] Anaphylaxis   Metronidazole Hives   Statins Other (See Comments)    Side Effect: muscle pain Has tried Zocor, Crestor--she does not want to take a statin, period   Bee Venom Other (See Comments)    Unknown reaction   Ace Inhibitors Cough   Adhesive [Tape] Itching, Rash and Other (See Comments)    Blistering of skin     Chief Complaint  Patient presents with   Medical Management of Chronic Issues    Patient presents today for a 3 month follow-up.   Quality Metric Gaps    Per pt's care gap in Epic she is due for foot exam, eye exam, microalbumin, zoster, tetanus/TDAP and Covid booster.      HPI: Patient is a 80 y.o. female seen today for medical management of chronic conditions.   Daughter present for encounter.   Paperwork from [facility] not provided today.   [place] claims she is refusing medication. Nursing administration has mentioned increased sundowning behaviors to daughter. Daughter states she is sleeping more during the day and up all night. [place] has had to call daughter in order to calm her down and also take her medications. Daughter requesting times of medications be changed to dinnertime to help with medication compliance.   Back pain- She continues to have lower back pain. Followed by Dr. Nelva Bush for lumbar injections. Last injection 07/16.   Sees eye doctor yearly for glaucoma. Daughter plans to schedule appointment soon.   Daughter plans to schedule flu vaccination next month. Ambulates on her own.  Daughter denies recent falls or injuries.    Requesting podiatry consult for toenails to be trimmed.   No recent falls or injuries.      Review of Systems:  Review of Systems  Unable to perform ROS: Dementia   Past Medical History:  Diagnosis Date   Allergy    Arthritis    Chronic back pain    DDD (degenerative disc disease), lumbar    Diabetes mellitus without complication ([place])    Diabetes type 2, uncontrolled ([place]) 09/18/2006   Annotation: Noninsulin dependent Qualifier: Diagnosis of  By: Amil Amen MD, Benjamine Mola     Former tobacco use    Hyperlipidemia    a. H/o muscle aches with statins per PCP notes.   Hypertension    Hypothyroidism    a. prior thyroid removal.   Memory loss or impairment 03/29/2019   Stroke [facility])    a. Listed in PCP notes: "History of stroke:  History of terrible headache.  Had a CT scan of brain and found "ministroke"  Was removed from ERT subsequently. "   Past Surgical History:  Procedure Laterality Date   ABDOMINAL HYSTERECTOMY  1994   TAH, not clear if unilateral oophorectomy as well.   [place]   open   [place] LEVEL 4  2005   In [place]  2012   multinodular goiter   Social History:   reports that she has quit smoking. She has never used smokeless  tobacco. She reports that she does not drink alcohol and does not use drugs.  Family History  Problem Relation Age of Onset   Heart disease Mother        unclear details "atherosclerosis"   Diabetes Mother    Alcohol abuse Sister    Diabetes Brother    Diabetes Brother    Drug abuse Brother    Diabetes Daughter    Heart disease Son        CHF   Alcohol abuse Son     Medications: Patient's Medications  New Prescriptions   No medications on file  Previous Medications   ACETAMINOPHEN (TYLENOL) 325 MG TABLET    Take 650 mg by mouth every 6 (six) hours as needed for mild pain (fever greater than or equal to 101 degrees).    ALUMINUM-MAGNESIUM HYDROXIDE-SIMETHICONE (MAALOX) [id number] MG/5ML SUSP    Take 30 mLs by mouth every 6 (six) hours as needed (heartburn/indigestion).   AMLODIPINE (NORVASC) 5 MG TABLET    Take 1 tablet (5 mg total) by mouth daily after lunch.   CALCIUM CARBONATE-VIT D-MIN (CALTRATE 600+D PLUS MINERALS) 600-800 MG-UNIT TABS    Take 600 mg by mouth daily after lunch.   DICLOFENAC SODIUM (VOLTAREN) 1 % GEL    Apply 4 g topically 3 (three) times daily. To lower back, left hand, and fingers   DOCUSATE SODIUM (COLACE) 250 MG CAPSULE    Take 1 capsule (250 mg total) by mouth daily as needed for constipation.   DONEPEZIL (ARICEPT) 10 MG TABLET    TAKE 1 TABLET BY MOUTH AT BEDTIME   FUROSEMIDE (LASIX) 20 MG TABLET    TAKE 1 TABLET BY MOUTH EVERY DAY AFTER LUNCH   HYDROCODONE-ACETAMINOPHEN (NORCO) 10-325 MG TABLET    Take 1 tablet by mouth See admin instructions. Take one tablet by mouth daily at 9am, 1pm, 5pm and 9pm   LEVOTHYROXINE (SYNTHROID) 112 MCG TABLET    TAKE 1 TABLET BY MOUTH EVERY DAY BEFORE LUNCH   LORATADINE (ALLERGY RELIEF) 10 MG TABLET    Take 1 tablet (10 mg total) by mouth daily after lunch.   METFORMIN (GLUCOPHAGE) 500 MG TABLET    TAKE 1 TABLET BY MOUTH EVERY DAY AFTER LUNCH   NEOMYCIN-BACITRACIN-POLYMYXIN (TRIPLE ANTIBIOTIC) [phone] OINT    Apply 1 application topically as needed (minor skin tears/abrasions).   PANTOPRAZOLE (PROTONIX) 40 MG TABLET    Take 1 tablet (40 mg total) by mouth daily.   POLYETHYLENE GLYCOL POWDER (GLYCOLAX/MIRALAX) 17 GM/SCOOP POWDER    Take 17 g by mouth 2 (two) times daily.   POTASSIUM CHLORIDE SA (KLOR-CON) 20 MEQ TABLET    Take 1 tablet (20 mEq total) by mouth daily after lunch.   QUETIAPINE (SEROQUEL) 100 MG TABLET    TAKE 1 TABLET BY MOUTH AT BEDTIME   TRAZODONE (DESYREL) 100 MG TABLET    Take 1 tablet (100 mg total) by mouth at bedtime. for sleep  Modified Medications   No medications on file  Discontinued Medications   No medications on file     Physical Exam:  Vitals:   12/12/20 1459  BP: 130/80  Pulse: 80  Temp: (!) 97.1 F (36.2 C)  SpO2: 97%  Weight: 168 lb 12.8 oz (76.6 kg)  Height: '5\' 4"'$  (1.626 m)   Body mass index is 28.97 kg/m. Wt Readings from Last 3 Encounters:  12/12/20 168 lb 12.8 oz (76.6 kg)  10/24/20 161 lb 12.8 oz (73.4 kg)  08/29/20 163 lb (73.9 kg)  Physical Exam Vitals reviewed.  Constitutional:      General: She is not in acute distress. HENT:     Head: Normocephalic.  Eyes:     General:        Right eye: No discharge.        Left eye: No discharge.  Neck:     Vascular: No carotid bruit.  Cardiovascular:     Rate and Rhythm: Normal rate and regular rhythm.     Pulses: Normal pulses.          Dorsalis pedis pulses are 2+ on the right side and 2+ on the left side.       Posterior tibial pulses are 2+ on the right side and 2+ on the left side.     Heart sounds: Normal heart sounds. No murmur heard. Pulmonary:     Effort: Pulmonary effort is normal. No respiratory distress.     Breath sounds: Normal breath sounds. No wheezing.  Abdominal:     General: Bowel sounds are normal. There is no distension.     Palpations: Abdomen is soft.     Tenderness: There is no abdominal tenderness.  Musculoskeletal:     Cervical back: Normal range of motion.     Right lower leg: No edema.     Left lower leg: No edema.  Feet:     Right foot:     Protective Sensation: 10 sites tested.  10 sites sensed.     Skin integrity: Skin integrity normal.     Toenail Condition: Right toenails are abnormally thick. Fungal disease present.    Left foot:     Protective Sensation: 10 sites tested.  10 sites sensed.     Skin integrity: Skin integrity normal.     Toenail Condition: Left toenails are abnormally thick. Fungal disease present. Lymphadenopathy:     Cervical: No cervical adenopathy.  Skin:    General: Skin is warm and dry.     Capillary Refill: Capillary refill takes less than 2 seconds.   Neurological:     General: No focal deficit present.     Mental Status: She is alert. Mental status is at baseline.     Motor: Weakness present.     Gait: Gait abnormal.  Psychiatric:        Mood and Affect: Mood normal.        Behavior: Behavior normal.        Cognition and Memory: Memory is impaired.    Labs reviewed: Basic Metabolic Panel: Recent Labs    04/08/20 0240 05/29/20 1422 08/29/20 1627 10/20/20 1203  NA 136  --  139 140  K 3.6  --  3.8 3.9  CL 104  --  105 107  CO2 24  --  25 26  GLUCOSE 144*  --  135 144*  BUN 13  --  11 8  CREATININE 0.75  --  0.67 0.62  CALCIUM 8.7*  --  9.5 9.7  TSH  --  45.03* 48.32* 1.968   Liver Function Tests: Recent Labs    04/07/20 1638 08/29/20 1627 10/20/20 1203  AST '26 13 21  '$ ALT 24 8 34  ALKPHOS 74  --  70  BILITOT 0.7 0.4 0.4  PROT 6.7 6.7 6.6  ALBUMIN 3.8  --  3.9   Recent Labs    04/07/20 1638  LIPASE 30   No results for input(s): AMMONIA in the last 8760 hours. CBC: Recent Labs    04/07/20 1638 04/08/20  0315 10/20/20 1203  WBC 4.3 4.2 3.3*  NEUTROABS 2.1  --  1.6*  HGB 12.2 11.6* 12.7  HCT 36.0 34.7* 38.6  MCV 91.1 90.8 90.2  PLT 185 162 188   Lipid Panel: No results for input(s): CHOL, HDL, LDLCALC, TRIG, CHOLHDL, LDLDIRECT in the last 8760 hours. TSH: Recent Labs    05/29/20 1422 08/29/20 1627 10/20/20 1203  TSH 45.03* 48.32* 1.968   A1C: Lab Results  Component Value Date   HGBA1C 6.5 (H) 10/24/2020     Assessment/Plan 1. Type 2 diabetes mellitus with other specified complication, without long-term current use of insulin (HCC) - hgb A1c 6.5 10/24/2020 - Microalbumin/Creatinine Ratio, Urine- 2 12/12/2020 - monofilament foot exam 10/10 bilateral feet - cont metformin 500 mg daily - schedule diabetic eye exam  2. Essential hypertension - controlled  - cont amlodipine 5 mg daily - CBC with Differential/Platelet; Future - CMP- BUN/creat 10/0.77 12/12/2020  3. Acquired  hypothyroidism - TSH 2.36 12/12/2020 - cont levothyroxine  4. Onychauxis - thickened toenails during foot exam - Ambulatory referral to Podiatry  5. Need for diphtheria-tetanus-pertussis (Tdap) vaccine - given today - Tdap vaccine greater than or equal to 7yo IM  6. Vascular dementia with behavior disturbance ([place]) - sundowning more - still ambulating on her own - recommend changing medications times to dinnertime to help with medication compliance - requesting [facility] to fax over medication paperwork in order to make changes  Total Time: 38 minutes. Greater than 50% of time spent doing patient education on medication management and symptom management.    Next appt: 04/17/2021  Windell Moulding, [facility] [phone]

## 2020-12-13 LAB — CBC WITH DIFFERENTIAL/PLATELET
Absolute Monocytes: 308 cells/uL (ref 200–950)
Basophils Absolute: 30 cells/uL (ref 0–200)
Basophils Relative: 0.8 %
Eosinophils Absolute: 110 cells/uL (ref 15–500)
Eosinophils Relative: 2.9 %
HCT: 40.4 % (ref 35.0–45.0)
Hemoglobin: 13.3 g/dL (ref 11.7–15.5)
Lymphs Abs: 1189 cells/uL (ref 850–3900)
MCH: 29.6 pg (ref 27.0–33.0)
MCHC: 32.9 g/dL (ref 32.0–36.0)
MCV: 89.8 fL (ref 80.0–100.0)
MPV: 11.6 fL (ref 7.5–12.5)
Monocytes Relative: 8.1 %
Neutro Abs: 2162 cells/uL (ref 1500–7800)
Neutrophils Relative %: 56.9 %
Platelets: 191 10*3/uL (ref 140–400)
RBC: 4.5 10*6/uL (ref 3.80–5.10)
RDW: 12.5 % (ref 11.0–15.0)
Total Lymphocyte: 31.3 %
WBC: 3.8 10*3/uL (ref 3.8–10.8)

## 2020-12-13 LAB — COMPREHENSIVE METABOLIC PANEL
AG Ratio: 1.7 (calc) (ref 1.0–2.5)
ALT: 15 U/L (ref 6–29)
AST: 17 U/L (ref 10–35)
Albumin: 4.2 g/dL (ref 3.6–5.1)
Alkaline phosphatase (APISO): 69 U/L (ref 37–153)
BUN: 10 mg/dL (ref 7–25)
CO2: 28 mmol/L (ref 20–32)
Calcium: 9.6 mg/dL (ref 8.6–10.4)
Chloride: 104 mmol/L (ref 98–110)
Creat: 0.77 mg/dL (ref 0.60–1.00)
Globulin: 2.5 g/dL (calc) (ref 1.9–3.7)
Glucose, Bld: 124 mg/dL (ref 65–139)
Potassium: 3.8 mmol/L (ref 3.5–5.3)
Sodium: 141 mmol/L (ref 135–146)
Total Bilirubin: 0.4 mg/dL (ref 0.2–1.2)
Total Protein: 6.7 g/dL (ref 6.1–8.1)

## 2020-12-13 LAB — MICROALBUMIN / CREATININE URINE RATIO
Creatinine, Urine: 196 mg/dL (ref 20–275)
Microalb Creat Ratio: 2 mcg/mg creat (ref <30)
Microalb, Ur: 0.4 mg/dL

## 2020-12-13 LAB — TSH: TSH: 2.36 mIU/L (ref 0.40–4.50)

## 2020-12-19 ENCOUNTER — Other Ambulatory Visit: Payer: Self-pay | Admitting: Orthopedic Surgery

## 2020-12-19 DIAGNOSIS — Z780 Asymptomatic menopausal state: Secondary | ICD-10-CM

## 2020-12-19 DIAGNOSIS — J3089 Other allergic rhinitis: Secondary | ICD-10-CM

## 2020-12-19 DIAGNOSIS — K219 Gastro-esophageal reflux disease without esophagitis: Secondary | ICD-10-CM

## 2020-12-19 MED ORDER — CALTRATE 600+D PLUS MINERALS 600-800 MG-UNIT PO TABS: 600.0000 mg | ORAL_TABLET | Freq: Every day | ORAL | 5 refills | Status: DC

## 2020-12-19 MED ORDER — FUROSEMIDE 20 MG PO TABS: ORAL_TABLET | ORAL | 3 refills | Status: DC

## 2020-12-19 MED ORDER — PANTOPRAZOLE SODIUM 40 MG PO TBEC: 40.0000 mg | DELAYED_RELEASE_TABLET | Freq: Every day | ORAL | 3 refills | Status: DC

## 2020-12-19 NOTE — Progress Notes (Signed)
Calcium and Protonix not listed on patients medication list at [facility]. Prescriptions printed and faxed to [facility]. Furosemide discontinued.

## 2020-12-24 ENCOUNTER — Encounter: Payer: Self-pay | Admitting: Orthopedic Surgery

## 2020-12-24 ENCOUNTER — Other Ambulatory Visit: Payer: Self-pay | Admitting: Orthopedic Surgery

## 2020-12-24 DIAGNOSIS — Z9114 Patient's other noncompliance with medication regimen: Secondary | ICD-10-CM

## 2020-12-24 DIAGNOSIS — F0151 Vascular dementia with behavioral disturbance: Secondary | ICD-10-CM

## 2020-12-24 DIAGNOSIS — F01518 Vascular dementia, unspecified severity, with other behavioral disturbance: Secondary | ICD-10-CM

## 2020-12-26 ENCOUNTER — Telehealth: Payer: Self-pay

## 2020-12-26 NOTE — Telephone Encounter (Signed)
I have sent revised orders regarding medication times last week. I also sent a separate nursing order to call daughters if she refuses medication. [facility] just called to confirm medication time changes. I would recommend asking for a print out of medication changes from [facility].

## 2020-12-26 NOTE — Telephone Encounter (Signed)
[facility] resident. Patient's daughter [name] called stating there is some communication issues with the facility understanding correct instructions. Medications was supposed to be changed, she has also sent mychart. Paperwork was faxed over with orders. Facility did not receive orders. Daughter [phone] works at night. Other daughter Rosario Jacks phone number is backup [phone]. [name] says facility says they only received a paper "suggesting she gets her meds earlier bc of sundown", But each medication need specific need instructions. [name] says her mom gets her last medication at 10p and the patient is now refusing her meds.   Lovena Le possibly sent papers, but is out of office today  To [name]

## 2020-12-26 NOTE — Telephone Encounter (Signed)
Joy Ward with [facility] [phone] called and stated that they have changed patient's medication time. Stated that patient doesn't get any medication now after 8pm. Stated that she gets her Hydrocodone, Seroquel and Trazodone at 8pm.  FYI.

## 2021-01-01 ENCOUNTER — Other Ambulatory Visit: Payer: Self-pay | Admitting: Orthopedic Surgery

## 2021-01-01 DIAGNOSIS — K219 Gastro-esophageal reflux disease without esophagitis: Secondary | ICD-10-CM

## 2021-01-03 ENCOUNTER — Other Ambulatory Visit: Payer: Self-pay

## 2021-01-03 ENCOUNTER — Emergency Department (HOSPITAL_BASED_OUTPATIENT_CLINIC_OR_DEPARTMENT_OTHER)
Admission: EM | Admit: 2021-01-03 | Discharge: 2021-01-04 | Disposition: A | Discharge status: Home / Self Care | Payer: Medicare Other | Attending: Emergency Medicine | Admitting: Emergency Medicine

## 2021-01-03 ENCOUNTER — Encounter (HOSPITAL_BASED_OUTPATIENT_CLINIC_OR_DEPARTMENT_OTHER): Payer: Self-pay

## 2021-01-03 DIAGNOSIS — E1140 Type 2 diabetes mellitus with diabetic neuropathy, unspecified: Secondary | ICD-10-CM | POA: Diagnosis not present

## 2021-01-03 DIAGNOSIS — Z87891 Personal history of nicotine dependence: Secondary | ICD-10-CM | POA: Insufficient documentation

## 2021-01-03 DIAGNOSIS — I10 Essential (primary) hypertension: Secondary | ICD-10-CM | POA: Diagnosis not present

## 2021-01-03 DIAGNOSIS — E039 Hypothyroidism, unspecified: Secondary | ICD-10-CM | POA: Insufficient documentation

## 2021-01-03 DIAGNOSIS — K5900 Constipation, unspecified: Secondary | ICD-10-CM | POA: Insufficient documentation

## 2021-01-03 DIAGNOSIS — E1142 Type 2 diabetes mellitus with diabetic polyneuropathy: Secondary | ICD-10-CM | POA: Insufficient documentation

## 2021-01-03 DIAGNOSIS — Z79899 Other long term (current) drug therapy: Secondary | ICD-10-CM | POA: Insufficient documentation

## 2021-01-03 DIAGNOSIS — F0390 Unspecified dementia without behavioral disturbance: Secondary | ICD-10-CM | POA: Diagnosis not present

## 2021-01-03 NOTE — ED Triage Notes (Signed)
Patient BIB [facility] from [facility] for Constipation.  Patient states she has a History of Constipation and has not had a regular BM in approximately 12 hours.  Hx of Dementia. A&Ox4. GCS 15. NAD Noted during Triage.

## 2021-01-04 DIAGNOSIS — K5900 Constipation, unspecified: Secondary | ICD-10-CM | POA: Diagnosis not present

## 2021-01-04 NOTE — ED Notes (Signed)
Daughter provided with an Update regarding Patient Care and Disposition.

## 2021-01-04 NOTE — ED Notes (Signed)
Patient ambulated well to the BR at this time.

## 2021-01-04 NOTE — ED Notes (Signed)
Care Handoff/Patient Report given to Levada Dy, RN at this Time.

## 2021-01-04 NOTE — ED Notes (Signed)
Called [facility] to take patient back to the [facility]

## 2021-01-04 NOTE — ED Notes (Signed)
Patient becoming agitated at this time regarding Patient Room and Location. Patient at [place] at this time attempting to call Family. MD Wickline aware and called Family. Patient is still awaiting [facility] for Transportation to Facility. Patient currently on Phone with Family Member.

## 2021-01-04 NOTE — ED Notes (Signed)
Patient continues to be agitated towards this RN. Oncoming RN made aware.

## 2021-01-04 NOTE — ED Notes (Signed)
Pt confused, walking around this am, stating we kidnapped her. RN able to calm patient, but still refused to let me take vital signs. Pt's daughter [name] took patient home, dc instructions reviewed with patients daughter.

## 2021-01-04 NOTE — ED Notes (Signed)
Patient brought to [facility] via Wheelchair. No Difficulties/Complications. Patient returned to [employer] and is comfortable at this time. Patient provided with Cranapple Juice as requested. Call [place] within Reach.

## 2021-01-04 NOTE — ED Notes (Signed)
This RN presented the AVS utilizing Teachback Method. Patient verbalizes understanding of Discharge Instructions. Opportunity for Questioning and Answers were provided. Patient currently awaiting for Transport back to Facility.

## 2021-01-04 NOTE — ED Notes (Signed)
Patient comfortable at this time. Call [place] within Reach. Non-Skid Socks applied and Fall-Risk Bracelet applied. Staff will continue to monitor.

## 2021-01-04 NOTE — ED Provider Notes (Signed)
[facility] EM[facility] DEPT Provider Note   CSN: [id number] Arrival date & time: 01/03/21  2334     History Chief Complaint  Patient presents with   Constipation  Level 5 caveat due to dementia  Joy Ward is a 80 y.o. female.  The history is provided by the patient.  Constipation Severity:  Moderate Timing:  Intermittent Progression:  Improving Chronicity:  New Stool description:  Hard and small Worsened by:  Nothing Associated symptoms: no abdominal pain, no fever and no vomiting   Patient presents for nursing facility for consultation She reports she is not constipated recently but actually had a small hard bowel movement earlier.  No fevers or vomiting.  No abdominal pain    Past Medical History:  Diagnosis Date   Allergy    Arthritis    Chronic back pain    DDD (degenerative disc disease), lumbar    Diabetes mellitus without complication ([place])    Diabetes type 2, uncontrolled ([place]) 09/18/2006   Annotation: Noninsulin dependent Qualifier: Diagnosis of  By: Amil Amen MD, Benjamine Mola     Former tobacco use    Hyperlipidemia    a. H/o muscle aches with statins per PCP notes.   Hypertension    Hypothyroidism    a. prior thyroid removal.   Memory loss or impairment 03/29/2019   Stroke [facility])    a. Listed in PCP notes: "History of stroke:  History of terrible headache.  Had a CT scan of brain and found "ministroke"  Was removed from ERT subsequently. "    Patient Active Problem List   Diagnosis Date Noted   Ataxia 04/07/2020   Weakness generalized 04/07/2020   Weakness 04/07/2020   Carpal tunnel syndrome of left wrist 02/20/2020   Diabetic polyneuropathy associated with type 2 diabetes mellitus ([place]) 02/20/2020   Lower GI bleed 07/31/2019   Syncope 07/30/2019   Pancytopenia ([place]) 07/30/2019   Hyperchloremia [id number]   Metabolic acidosis [id number]   Hypocalcemia 07/30/2019   Palliative care patient 07/03/2019   Hallucinations [id number]    Metabolic encephalopathy [id number]   Altered mental status    Recurrent UTI 04/30/2019   Vascular dementia with behavior disturbance ([place]) 04/14/2019   Primary insomnia 04/14/2019   Hypothyroidism 04/14/2019   Diabetes mellitus ([place]) 04/14/2019   Memory loss or impairment 03/29/2019   Diabetic peripheral neuropathy associated with type 2 diabetes mellitus ([place]) 12/29/2017   Peripheral edema 12/29/2017   Elevated liver enzymes 10/15/2016   Chest pain [id number]   Systolic murmur [id number]   Hypercalcemia 04/22/2016   Hyperlipidemia 04/22/2016   Baker's cyst of knee, right 04/21/2016   HYPOKALEMIA 01/24/2010   CONSTIPATION 12/23/2009   INTERMITTENT VERTIGO 12/23/2009   SINUSITIS, ACUTE 04/25/2009   VAGINITIS, CANDIDAL 02/14/2009   HIP PAIN, RIGHT 02/14/2009   HYPERLIPIDEMIA, MIXED 01/01/2009   BACK PAIN, LUMBAR, WITH RADICULOPATHY 12/25/2008   KNEE PAIN, BILATERAL 12/18/2008   TROCHANTERIC BURSITIS, BILATERAL 12/18/2008   Disturbance in sleep behavior 12/18/2008   ONYCHOMYCOSIS, TOENAILS 10/30/2008   ENTERITIS, CLOSTRIDIUM DIFFICILE 10/15/2006   Diabetes type 2, uncontrolled ([place]) 09/18/2006   Essential hypertension 09/18/2006   Allergic rhinitis 09/18/2006   GOITER, MULTINODULAR 06/29/2006   STROKE 04/27/2001    Past Surgical History:  Procedure Laterality Date   ABDOMINAL HYSTERECTOMY  1994   TAH, not clear if unilateral oophorectomy as well.   [place]   open   [place] LEVEL 4  2005   In [place]  THYROIDECTOMY  2012   multinodular goiter     OB History   No obstetric history on file.     Family History  Problem Relation Age of Onset   Heart disease Mother        unclear details "atherosclerosis"   Diabetes Mother    Alcohol abuse Sister    Diabetes Brother    Diabetes Brother    Drug abuse Brother    Diabetes Daughter    Heart disease Son        CHF   Alcohol abuse Son      Social History   Tobacco Use   Smoking status: Former   Smokeless tobacco: Never   Tobacco comments:    No smoking since 80 yo - smoked 10 yrs  Vaping Use   Vaping Use: Never used  Substance Use Topics   Alcohol use: No   Drug use: No    Home Medications Prior to Admission medications   Medication Sig Start Date End Date Taking? Authorizing Provider  acetaminophen (TYLENOL) 325 MG tablet Take 650 mg by mouth every 6 (six) hours as needed for mild pain (fever greater than or equal to 101 degrees).    [provider]  aluminum-magnesium hydroxide-simethicone (MAALOX) [id number] MG/5ML SUSP Take 30 mLs by mouth every 6 (six) hours as needed (heartburn/indigestion).    [provider]  amLODipine (NORVASC) 5 MG tablet Take 1 tablet (5 mg total) by mouth daily after lunch. 08/29/20   Fargo, [name] E, NP  Calcium Carbonate-Vit D-Min (CALTRATE 600+D PLUS MINERALS) 600-800 MG-UNIT TABS Take 600 mg by mouth daily after lunch. 12/19/20   Fargo, [name] E, NP  diclofenac Sodium (VOLTAREN) 1 % GEL Apply 4 g topically 3 (three) times daily. To lower back, left hand, and fingers 05/29/20   Ngetich, Dinah C, NP  docusate sodium (COLACE) 250 MG capsule Take 1 capsule (250 mg total) by mouth daily as needed for constipation. 10/16/20   Hughie Closs, PA-C  donepezil (ARICEPT) 10 MG tablet TAKE 1 TABLET BY MOUTH AT BEDTIME 12/02/20   Wardell Honour, MD  HYDROcodone-acetaminophen [facility]) 10-325 MG tablet Take 1 tablet by mouth See admin instructions. Take one tablet by mouth daily at 9am, 1pm, 5pm and 9pm 11/27/20   Ngetich, Dinah C, NP  levothyroxine (SYNTHROID) 112 MCG tablet TAKE 1 TABLET BY MOUTH EVERY DAY BEFORE LUNCH 12/02/20   Wardell Honour, MD  loratadine (ALLERGY RELIEF) 10 MG tablet Take 1 tablet (10 mg total) by mouth daily after lunch. 08/29/20   Fargo, [name] E, NP  metFORMIN (GLUCOPHAGE) 500 MG tablet TAKE 1 TABLET BY MOUTH EVERY DAY AFTER LUNCH 12/02/20   Wardell Honour, MD   Neomycin-Bacitracin-Polymyxin (TRIPLE ANTIBIOTIC) [phone] OINT Apply 1 application topically as needed (minor skin tears/abrasions).    [provider]  pantoprazole (PROTONIX) 40 MG tablet TAKE 1 TABLET BY MOUTH EVERY DAY 01/01/21   Wardell Honour, MD  polyethylene glycol powder (GLYCOLAX/MIRALAX) 17 GM/SCOOP powder Take 17 g by mouth 2 (two) times daily. 10/24/20   Fargo, [name] E, NP  potassium chloride SA (KLOR-CON) 20 MEQ tablet Take 1 tablet (20 mEq total) by mouth daily after lunch. 08/29/20   Yvonna Alanis, NP  QUEtiapine (SEROQUEL) 100 MG tablet TAKE 1 TABLET BY MOUTH AT BEDTIME 09/04/20   Wardell Honour, MD  traZODone (DESYREL) 100 MG tablet Take 1 tablet (100 mg total) by mouth at bedtime. for sleep 10/20/20   Isla Pence, MD  Allergies    Clindamycin hcl, Sulfamethoxazole-trimethoprim, Zanaflex [tizanidine hcl], Metronidazole, Statins, Bee venom, Ace inhibitors, and Adhesive [tape]  Review of Systems   Review of Systems  Unable to perform ROS: Dementia  Constitutional:  Negative for fever.  Gastrointestinal:  Positive for constipation. Negative for abdominal pain and vomiting.   Physical Exam Updated Vital Signs BP (!) 156/73 (BP Location: Right Arm)   Pulse 75   Temp 98.6 F (37 C) (Oral)   Resp 16   Ht 1.626 m ('5\' 4"'$ )   Wt 76.6 kg   SpO2 100%   BMI 28.99 kg/m   Physical Exam CONSTITUTIONAL: Elderly, no acute distress HEAD: Normocephalic/atraumatic EYES: EOMI ENMT: Mucous membranes moist NECK: supple no meningeal signs SPINE/BACK:healed midline scar noted CV: S1/S2 noted LUNGS: Lungs are clear to auscultation bilaterally, no apparent distress ABDOMEN: soft, nontender, no rebound or guarding, bowel sounds noted throughout abdomen Rectal-chaperoned by nurse Lilbert-external hemorrhoids noted.  No rectal mass or abscess.  No blood or melena noted.  No stool impaction noted. GU:no cva tenderness NEURO: Pt is awake/alert, moves all extremitiesx4.  No  facial droop.   EXTREMITIES: pulses normal/equal, full ROM SKIN: warm, color normal    ED Results / Procedures / Treatments   Labs (all labs ordered are listed, but only abnormal results are displayed) Labs Reviewed - No data to display  EKG None  Radiology No results found.  Procedures Procedures   Medications Ordered in ED Medications - No data to display  ED Course  I have reviewed the triage vital signs and the nursing notes.     MDM Rules/Calculators/A&P                           I spoke to the nursing home staff.  They reports the patient called out she was impacted and was lying on her side and reported not been able to pass stool or gas.  There was no vomiting Or pain reported  On my exam there is no stool impaction.  Her abdomen is soft with bowel sounds.  No vomiting.  Patient will be discharged No indication for CT imaging or further workup at this time Final Clinical Impression(s) / ED Diagnoses Final diagnoses:  Constipation, unspecified constipation type    Rx / DC Orders ED Discharge Orders     None        Ripley Fraise, MD 01/04/21 0110

## 2021-01-04 NOTE — ED Notes (Signed)
Patient provided with [facility] and Cheese as requested. Patient comfortable at this time eating food and drinking Cran-Apple Juice.

## 2021-01-13 ENCOUNTER — Telehealth: Payer: Self-pay | Admitting: *Deleted

## 2021-01-13 ENCOUNTER — Ambulatory Visit: Payer: Medicare Other | Admitting: Orthopedic Surgery

## 2021-01-13 NOTE — Telephone Encounter (Signed)
Attempted to call and leave message, but voice mial was full.

## 2021-01-13 NOTE — Telephone Encounter (Signed)
Joy Ward, daughter called and left message on Clinical intake stating that you can call her at [phone]

## 2021-01-13 NOTE — Telephone Encounter (Signed)
Patient daughter, [name], called and stated that she would like to speak with [name] regarding patient's medications.   Patient had an appointment but they had to cancel because no Transportation.   Daughter would like for you to call her at [phone]

## 2021-01-13 NOTE — Telephone Encounter (Signed)
I tried to call Joy Ward for a second time with no answer. I would recommend obtaining a print out of orders on Joy Ward from facility. I will try to call later, as well.

## 2021-01-13 NOTE — Telephone Encounter (Signed)
Fargo, [name] E, NP  You 5 minutes ago (11:45 AM)   I was unable to contact [employer], left message and advised her to call office.     Daughter is wanting to know the times of her medications. When she is suppose to be taking. Stating that she isn't getting a straight answer from the facility. Stated that you and her discussed this at patient's last appointment.   I informed her that you have faxed orders over to the facility but she is wanting the times for medications herself.   Please Advise.

## 2021-01-15 ENCOUNTER — Other Ambulatory Visit: Payer: Self-pay

## 2021-01-15 ENCOUNTER — Ambulatory Visit (INDEPENDENT_AMBULATORY_CARE_PROVIDER_SITE_OTHER): Payer: Medicare Other | Admitting: Podiatry

## 2021-01-15 ENCOUNTER — Encounter: Payer: Self-pay | Admitting: Podiatry

## 2021-01-15 DIAGNOSIS — B351 Tinea unguium: Secondary | ICD-10-CM

## 2021-01-15 DIAGNOSIS — E1165 Type 2 diabetes mellitus with hyperglycemia: Secondary | ICD-10-CM | POA: Diagnosis not present

## 2021-01-15 DIAGNOSIS — M79675 Pain in left toe(s): Secondary | ICD-10-CM

## 2021-01-15 DIAGNOSIS — M79674 Pain in right toe(s): Secondary | ICD-10-CM

## 2021-01-15 NOTE — Progress Notes (Signed)
This patient returns to my office for at risk foot care.  This patient requires this care by a professional since this patient will be at risk due to having diabetic neuropathy.  This patient is unable to cut nails herself since the patient cannot reach her nails.These nails are painful walking and wearing shoes.  This patient presents for at risk foot care today.  General Appearance  Alert, conversant and in no acute stress.  Vascular  Dorsalis pedis and posterior tibial  pulses are palpable  bilaterally.  Capillary return is within normal limits  bilaterally. Temperature is within normal limits  bilaterally.  Neurologic  Senn-Weinstein monofilament wire test within normal limits  bilaterally. Muscle power within normal limits bilaterally.  Nails Thick disfigured discolored nails with subungual debris  from hallux to fifth toes bilaterally. No evidence of bacterial infection or drainage bilaterally.  Orthopedic  No limitations of motion  feet .  No crepitus or effusions noted.  No bony pathology or digital deformities noted.  Skin  normotropic skin with no porokeratosis noted bilaterally.  No signs of infections or ulcers noted.     Onychomycosis  Pain in right toes  Pain in left toes  Consent was obtained for treatment procedures.   Mechanical debridement of nails 1-5  bilaterally performed with a nail nipper.  Filed with dremel without incident.     Return office visit   3 months                   Told patient to return for periodic foot care and evaluation due to potential at risk complications.   Narya Beavin DPM   

## 2021-01-20 ENCOUNTER — Other Ambulatory Visit: Payer: Self-pay | Admitting: Family Medicine

## 2021-01-20 ENCOUNTER — Other Ambulatory Visit: Payer: Self-pay | Admitting: *Deleted

## 2021-01-20 DIAGNOSIS — K219 Gastro-esophageal reflux disease without esophagitis: Secondary | ICD-10-CM

## 2021-01-20 NOTE — Telephone Encounter (Signed)
Please call Roselyn Reef and let her know that pts orthopedic has been refilling based on the database review.

## 2021-01-20 NOTE — Telephone Encounter (Signed)
Joy Ward, Med Tech with [facility], called and requested refill for patient's Hydrocodone.  Epic LR: 11/27/2020 No contract on file, added note to upcoming appointment to update.  Pended Rx and sent to [facility] for approval due to Dr. Sabra Heck out of office.

## 2021-01-20 NOTE — Telephone Encounter (Signed)
Spoke with Roselyn Reef with [facility] and she stated that she will call patient's Orthopaedic.

## 2021-01-21 DIAGNOSIS — Z23 Encounter for immunization: Secondary | ICD-10-CM | POA: Diagnosis not present

## 2021-02-11 ENCOUNTER — Other Ambulatory Visit: Payer: Self-pay | Admitting: Orthopedic Surgery

## 2021-02-11 DIAGNOSIS — J3089 Other allergic rhinitis: Secondary | ICD-10-CM

## 2021-02-27 ENCOUNTER — Ambulatory Visit (INDEPENDENT_AMBULATORY_CARE_PROVIDER_SITE_OTHER): Payer: Medicare Other | Admitting: Orthopedic Surgery

## 2021-02-27 ENCOUNTER — Encounter: Payer: Self-pay | Admitting: Orthopedic Surgery

## 2021-02-27 ENCOUNTER — Other Ambulatory Visit: Payer: Self-pay | Admitting: Orthopedic Surgery

## 2021-02-27 ENCOUNTER — Other Ambulatory Visit: Payer: Self-pay

## 2021-02-27 VITALS — BP 138/80 | HR 73 | Temp 97.3°F | Ht 64.0 in | Wt 150.4 lb

## 2021-02-27 DIAGNOSIS — K5901 Slow transit constipation: Secondary | ICD-10-CM

## 2021-02-27 DIAGNOSIS — K219 Gastro-esophageal reflux disease without esophagitis: Secondary | ICD-10-CM | POA: Diagnosis not present

## 2021-02-27 DIAGNOSIS — M159 Polyosteoarthritis, unspecified: Secondary | ICD-10-CM | POA: Diagnosis not present

## 2021-02-27 DIAGNOSIS — F01518 Vascular dementia, unspecified severity, with other behavioral disturbance: Secondary | ICD-10-CM | POA: Diagnosis not present

## 2021-02-27 DIAGNOSIS — E039 Hypothyroidism, unspecified: Secondary | ICD-10-CM | POA: Diagnosis not present

## 2021-02-27 DIAGNOSIS — E1169 Type 2 diabetes mellitus with other specified complication: Secondary | ICD-10-CM

## 2021-02-27 DIAGNOSIS — Z9114 Patient's other noncompliance with medication regimen: Secondary | ICD-10-CM

## 2021-02-27 DIAGNOSIS — I10 Essential (primary) hypertension: Secondary | ICD-10-CM

## 2021-02-27 MED ORDER — FAMOTIDINE 40 MG PO TABS: 40.0000 mg | ORAL_TABLET | Freq: Every day | ORAL | 5 refills | Status: DC

## 2021-02-27 MED ORDER — POLYETHYLENE GLYCOL 3350 17 GM/SCOOP PO POWD: 17.0000 g | ORAL | 1 refills | Status: DC

## 2021-02-27 MED ORDER — HYDROCODONE-ACETAMINOPHEN 10-325 MG PO TABS: 1.0000 | ORAL_TABLET | Freq: Two times a day (BID) | ORAL | 0 refills | Status: DC

## 2021-02-27 NOTE — Progress Notes (Signed)
Careteam: Patient Care Team: Wardell Honour, MD as PCP - General (Family Medicine)  Seen by: Windell Moulding, AGNP-C  PLACE OF SERVICE:  [place]  Advanced Directive information    Allergies  Allergen Reactions   Clindamycin Hcl Other (See Comments)    REACTION: C. difficile colitis   Sulfamethoxazole-Trimethoprim Other (See Comments)     Eye redness   Zanaflex [Tizanidine Hcl] Anaphylaxis   Metronidazole Hives   Statins Other (See Comments)    Side Effect: muscle pain Has tried Zocor, Crestor--she does not want to take a statin, period   Bee Venom Other (See Comments)    Unknown reaction   Ace Inhibitors Cough   Adhesive [Tape] Itching, Rash and Other (See Comments)    Blistering of skin     Chief Complaint  Patient presents with   Medical Management of Chronic Issues    Patient presents today for a follow-up. Daughter was concerned about patients sleeping pattern since medication was increased. Daughter reports while on vacation patient would sleep more than usual.     HPI: Patient is a 80 y.o. female seen today for medical management of chronic conditions.   Daughter present during encounter.   She has been fatigued since returning from her trip to [place]. Daughter reports she is sleeping more often. Daughter continues to report poor nursing care at the facility she is at. Many of the medications on her med list have not been refilled. She was recently taken to the ED 01/03/2021 for constipation. ED exam unremarkable and she was discharged back to facility. Daughter recently called [facility] to check refill dates. The last time Miralax was refilled was 10/24/2020. It is currently scheduled to be given twice a day. Daughter asking to give less often. In addition, daughter reports she is not getting hydrocodone consistently. Nursing staff will not give her a itemized pill count. At this time she does not present in severe pain. She often forget she is  hurting. We discussed reducing hydrocodone from 4x daily to 2x daily in an effort to reduce fatigue and constipation.   She will not take Nexium due to the color and capsule shape. Asking to try a different medication. She is a poor historian due to dementia. She will sometimes complain of burping and acid reflux.   No recent falls or injuries.   Daughter is working with sister to find another facility for her. Hopes to have her moved by the beginning of next year.   Missed eye exam today for this appointment. Scheduled to be seen in next month.    Review of Systems:  Review of Systems  Unable to perform ROS: Dementia   Past Medical History:  Diagnosis Date   Allergy    Arthritis    Chronic back pain    DDD (degenerative disc disease), lumbar    Diabetes mellitus without complication ([place])    Diabetes type 2, uncontrolled 09/18/2006   Annotation: Noninsulin dependent Qualifier: Diagnosis of  By: Amil Amen MD, Benjamine Mola     Former tobacco use    Hyperlipidemia    a. H/o muscle aches with statins per PCP notes.   Hypertension    Hypothyroidism    a. prior thyroid removal.   Memory loss or impairment 03/29/2019   Stroke [facility])    a. Listed in PCP notes: "History of stroke:  History of terrible headache.  Had a CT scan of brain and found "ministroke"  Was removed from ERT subsequently. "  Past Surgical History:  Procedure Laterality Date   ABDOMINAL HYSTERECTOMY  1994   TAH, not clear if unilateral oophorectomy as well.   [place]   open   [place] LEVEL 4  2005   In [place]  2012   multinodular goiter   Social History:   reports that she has quit smoking. She has never used smokeless tobacco. She reports that she does not drink alcohol and does not use drugs.  Family History  Problem Relation Age of Onset   Heart disease Mother        unclear details "atherosclerosis"   Diabetes  Mother    Alcohol abuse Sister    Diabetes Brother    Diabetes Brother    Drug abuse Brother    Diabetes Daughter    Heart disease Son        CHF   Alcohol abuse Son     Medications: Patient's Medications  New Prescriptions   No medications on file  Previous Medications   ACETAMINOPHEN (TYLENOL) 325 MG TABLET    Take 650 mg by mouth every 6 (six) hours as needed for mild pain (fever greater than or equal to 101 degrees).   ALUMINUM-MAGNESIUM HYDROXIDE-SIMETHICONE (MAALOX) 374-827-07 MG/5ML SUSP    Take 30 mLs by mouth every 6 (six) hours as needed (heartburn/indigestion).   AMLODIPINE (NORVASC) 5 MG TABLET    TAKE 1 TABLET BY MOUTH EVERY DAY AFTER LUNCH   CALCIUM CARBONATE-VIT D-MIN (CALTRATE 600+D PLUS MINERALS) 600-800 MG-UNIT TABS    Take 600 mg by mouth daily after lunch.   DICLOFENAC SODIUM (VOLTAREN) 1 % GEL    Apply 4 g topically 3 (three) times daily. To lower back, left hand, and fingers   DOCUSATE SODIUM (COLACE) 250 MG CAPSULE    Take 1 capsule (250 mg total) by mouth daily as needed for constipation.   DONEPEZIL (ARICEPT) 10 MG TABLET    TAKE 1 TABLET BY MOUTH AT BEDTIME   HYDROCODONE-ACETAMINOPHEN (NORCO) 10-325 MG TABLET    Take 1 tablet by mouth See admin instructions. Take one tablet by mouth daily at 9am, 1pm, 5pm and 9pm   LEVOTHYROXINE (SYNTHROID) 112 MCG TABLET    TAKE 1 TABLET BY MOUTH EVERY DAY BEFORE LUNCH   LORATADINE (ALLERGY RELIEF) 10 MG TABLET    Take 1 tablet (10 mg total) by mouth daily after lunch.   METFORMIN (GLUCOPHAGE) 500 MG TABLET    TAKE 1 TABLET BY MOUTH EVERY DAY AFTER LUNCH   NEOMYCIN-BACITRACIN-POLYMYXIN (TRIPLE ANTIBIOTIC) [phone] OINT    Apply 1 application topically as needed (minor skin tears/abrasions).   PANTOPRAZOLE (PROTONIX) 40 MG TABLET    TAKE 1 TABLET BY MOUTH EVERY DAY   POLYETHYLENE GLYCOL POWDER (GLYCOLAX/MIRALAX) 17 GM/SCOOP POWDER    Take 17 g by mouth 2 (two) times daily.   POTASSIUM CHLORIDE SA (KLOR-CON) 20 MEQ TABLET     Take 1 tablet (20 mEq total) by mouth daily after lunch.   QUETIAPINE (SEROQUEL) 100 MG TABLET    TAKE 1 TABLET BY MOUTH AT BEDTIME   TRAZODONE (DESYREL) 100 MG TABLET    Take 1 tablet (100 mg total) by mouth at bedtime. for sleep  Modified Medications   No medications on file  Discontinued Medications   No medications on file    Physical Exam:  Vitals:   02/27/21 1502  BP: 138/80  Pulse: 73  Temp: (!) 97.3 F (  36.3 C)  SpO2: 98%  Weight: 150 lb 6.4 oz (68.2 kg)  Height: 5\' 4"  (1.626 m)   Body mass index is 25.82 kg/m. Wt Readings from Last 3 Encounters:  02/27/21 150 lb 6.4 oz (68.2 kg)  01/03/21 168 lb 14 oz (76.6 kg)  12/12/20 168 lb 12.8 oz (76.6 kg)    Physical Exam Vitals reviewed.  Constitutional:      General: She is not in acute distress. HENT:     Head: Normocephalic.  Eyes:     General:        Right eye: No discharge.        Left eye: No discharge.  Neck:     Thyroid: No thyroid mass or thyromegaly.     Vascular: No carotid bruit.  Cardiovascular:     Rate and Rhythm: Normal rate and regular rhythm.     Pulses: Normal pulses.     Heart sounds: Normal heart sounds.  Pulmonary:     Effort: Pulmonary effort is normal. No respiratory distress.     Breath sounds: Normal breath sounds. No wheezing.  Abdominal:     General: Bowel sounds are normal. There is no distension.     Palpations: Abdomen is soft.     Tenderness: There is no abdominal tenderness.  Musculoskeletal:     Cervical back: Normal range of motion.     Right lower leg: No edema.     Left lower leg: No edema.  Lymphadenopathy:     Cervical: No cervical adenopathy.  Skin:    General: Skin is warm and dry.     Capillary Refill: Capillary refill takes less than 2 seconds.  Neurological:     General: No focal deficit present.     Mental Status: She is alert. Mental status is at baseline.     Motor: Weakness present.     Gait: Gait abnormal.  Psychiatric:        Mood and Affect: Mood  normal.        Behavior: Behavior normal.        Cognition and Memory: Memory is impaired.    Labs reviewed: Basic Metabolic Panel: Recent Labs    08/29/20 1627 10/20/20 1203 12/12/20 1554  NA 139 140 141  K 3.8 3.9 3.8  CL 105 107 104  CO2 25 26 28   GLUCOSE 135 144* 124  BUN 11 8 10   CREATININE 0.67 0.62 0.77  CALCIUM 9.5 9.7 9.6  TSH 48.32* 1.968 2.36   Liver Function Tests: Recent Labs    04/07/20 1638 08/29/20 1627 10/20/20 1203 12/12/20 1554  AST 26 13 21 17   ALT 24 8 34 15  ALKPHOS 74  --  70  --   BILITOT 0.7 0.4 0.4 0.4  PROT 6.7 6.7 6.6 6.7  ALBUMIN 3.8  --  3.9  --    Recent Labs    04/07/20 1638  LIPASE 30   No results for input(s): AMMONIA in the last 8760 hours. CBC: Recent Labs    04/07/20 1638 04/08/20 0315 10/20/20 1203 12/12/20 1554  WBC 4.3 4.2 3.3* 3.8  NEUTROABS 2.1  --  1.6* 2,162  HGB 12.2 11.6* 12.7 13.3  HCT 36.0 34.7* 38.6 40.4  MCV 91.1 90.8 90.2 89.8  PLT 185 162 188 191   Lipid Panel: No results for input(s): CHOL, HDL, LDLCALC, TRIG, CHOLHDL, LDLDIRECT in the last 8760 hours. TSH: Recent Labs    08/29/20 1627 10/20/20 1203 12/12/20 1554  TSH 48.32* 1.968  2.36   A1C: Lab Results  Component Value Date   HGBA1C 6.5 (H) 10/24/2020     Assessment/Plan 1. Primary osteoarthritis involving multiple joints - complaints of pain inconsistent due to dementia - recent issues with constipation - will decrease hydrocodone from 4x daily to 2x daily - encourage fluids  - cont tylenol 650 mg po bid - HYDROcodone-acetaminophen (NORCO) 10-325 MG tablet; Take 1 tablet by mouth 2 (two) times daily.  Dispense: 90 tablet; Refill: 0  2. Poor compliance with medication - some medications on her list have not been refilled in 5 months - unclear if she is refusing or medications are not being given  3. Vascular dementia with behavior disturbance - no recent behavioral outbursts - sleeping more - cont Seroquel and Aricept  4.  Type 2 diabetes mellitus with other specified complication, without long-term current use of insulin (HCC) - A1c 6.5 10/24/2020 - cont metformin - CBC with Differential/Platelet - CMP  5. Essential hypertension - controlled - cont amlodipine  6. Acquired hypothyroidism - more fatigued lately - cont levothyroxine - TSH  7. Slow transit constipation - abdomen soft, BS X4 - polyethylene glycol powder (GLYCOLAX/MIRALAX) 17 GM/SCOOP powder; Take 17 g by mouth every other day.  Dispense: 3350 g; Refill: 1 - cont colace - encourage fluids  8. Gastroesophageal reflux disease without esophagitis - would not take Nexium due to color and look of pil - famotidine (PEPCID) 40 MG tablet; Take 1 tablet (40 mg total) by mouth daily.  Dispense: 30 tablet; Refill: 5  Total time: 35 minutes. Greater than 50% of total time spent doing patient education on medication and symptom management regarding arthritic pain and GERD.    Next appt: 04/17/2021  Windell Moulding, [facility] [phone]

## 2021-02-27 NOTE — Patient Instructions (Addendum)
New prescription for Pepcid, Hydrocodone and Miralax

## 2021-02-28 ENCOUNTER — Other Ambulatory Visit: Payer: Self-pay | Admitting: Orthopedic Surgery

## 2021-02-28 DIAGNOSIS — E039 Hypothyroidism, unspecified: Secondary | ICD-10-CM

## 2021-02-28 LAB — CBC WITH DIFFERENTIAL/PLATELET
Absolute Monocytes: 198 cells/uL — ABNORMAL LOW (ref 200–950)
Basophils Absolute: 40 cells/uL (ref 0–200)
Basophils Relative: 1.1 %
Eosinophils Absolute: 90 cells/uL (ref 15–500)
Eosinophils Relative: 2.5 %
HCT: 37.9 % (ref 35.0–45.0)
Hemoglobin: 12.5 g/dL (ref 11.7–15.5)
Lymphs Abs: 1483 cells/uL (ref 850–3900)
MCH: 29.5 pg (ref 27.0–33.0)
MCHC: 33 g/dL (ref 32.0–36.0)
MCV: 89.4 fL (ref 80.0–100.0)
MPV: 12.1 fL (ref 7.5–12.5)
Monocytes Relative: 5.5 %
Neutro Abs: 1789 cells/uL (ref 1500–7800)
Neutrophils Relative %: 49.7 %
Platelets: 201 10*3/uL (ref 140–400)
RBC: 4.24 10*6/uL (ref 3.80–5.10)
RDW: 12.7 % (ref 11.0–15.0)
Total Lymphocyte: 41.2 %
WBC: 3.6 10*3/uL — ABNORMAL LOW (ref 3.8–10.8)

## 2021-02-28 LAB — COMPREHENSIVE METABOLIC PANEL
AG Ratio: 1.5 (calc) (ref 1.0–2.5)
ALT: 10 U/L (ref 6–29)
AST: 15 U/L (ref 10–35)
Albumin: 4 g/dL (ref 3.6–5.1)
Alkaline phosphatase (APISO): 58 U/L (ref 37–153)
BUN: 8 mg/dL (ref 7–25)
CO2: 25 mmol/L (ref 20–32)
Calcium: 9.7 mg/dL (ref 8.6–10.4)
Chloride: 105 mmol/L (ref 98–110)
Creat: 0.82 mg/dL (ref 0.60–1.00)
Globulin: 2.6 g/dL (calc) (ref 1.9–3.7)
Glucose, Bld: 161 mg/dL — ABNORMAL HIGH (ref 65–139)
Potassium: 3.8 mmol/L (ref 3.5–5.3)
Sodium: 140 mmol/L (ref 135–146)
Total Bilirubin: 0.4 mg/dL (ref 0.2–1.2)
Total Protein: 6.6 g/dL (ref 6.1–8.1)

## 2021-02-28 LAB — TSH: TSH: 0.16 mIU/L — ABNORMAL LOW (ref 0.40–4.50)

## 2021-02-28 MED ORDER — LEVOTHYROXINE SODIUM 100 MCG PO TABS: 100.0000 ug | ORAL_TABLET | Freq: Every day | ORAL | 3 refills | Status: DC

## 2021-03-04 ENCOUNTER — Other Ambulatory Visit: Payer: Self-pay | Admitting: *Deleted

## 2021-03-04 DIAGNOSIS — K219 Gastro-esophageal reflux disease without esophagitis: Secondary | ICD-10-CM

## 2021-03-04 NOTE — Telephone Encounter (Signed)
Cindy with [facility] called and stated that patient needs Rx's for Tylenol and Famotidine sent to Local pharmacy[facility].   [name] signed Orders on 02/20/2021 and faxed them.   Pended Rx's and sent to [name] for approval due to [place].

## 2021-03-05 MED ORDER — ACETAMINOPHEN 325 MG PO TABS: 650.0000 mg | ORAL_TABLET | Freq: Two times a day (BID) | ORAL | 5 refills | Status: DC

## 2021-03-05 MED ORDER — FAMOTIDINE 40 MG PO TABS: 40.0000 mg | ORAL_TABLET | Freq: Every day | ORAL | 5 refills | Status: DC

## 2021-03-09 ENCOUNTER — Encounter: Payer: Self-pay | Admitting: Orthopedic Surgery

## 2021-03-10 ENCOUNTER — Other Ambulatory Visit: Payer: Self-pay | Admitting: Orthopedic Surgery

## 2021-03-10 DIAGNOSIS — M159 Polyosteoarthritis, unspecified: Secondary | ICD-10-CM

## 2021-03-10 DIAGNOSIS — M150 Primary generalized (osteo)arthritis: Secondary | ICD-10-CM

## 2021-03-10 MED ORDER — HYDROCODONE-ACETAMINOPHEN 10-325 MG PO TABS: 1.0000 | ORAL_TABLET | Freq: Three times a day (TID) | ORAL | 0 refills | Status: DC

## 2021-03-12 ENCOUNTER — Other Ambulatory Visit: Payer: Self-pay | Admitting: *Deleted

## 2021-03-12 DIAGNOSIS — M159 Polyosteoarthritis, unspecified: Secondary | ICD-10-CM

## 2021-03-12 NOTE — Telephone Encounter (Signed)
Cindy with [facility] called and stated that they need a updated Rx sent to Patient's Pharmacy in order to change dosage.   Pended Rx and sent to [name] for approval.

## 2021-03-12 NOTE — Telephone Encounter (Signed)
Message printed and faxed to [facility] with [facility]. Fax: [phone]

## 2021-03-12 NOTE — Telephone Encounter (Signed)
Prescription for 90 hydrocodone tablets sent 02/27/2021. She is eligible for refill 03/29/2021. She should have plenty with script change from twice daily to three times daily. [place] may send a itemized medication count to office if there is a discrepancy in medication/pill count. I am ok to pre date prescription for 03/29/2021 if that would help.

## 2021-03-12 NOTE — Telephone Encounter (Signed)
Per [name]- good morning! I changed the hydrocodone prescription for Joy Ward. [name] needs a new order- can you fax order to [phone]? Thanks   Rx Order printed and faxed to [facility].

## 2021-03-13 ENCOUNTER — Telehealth: Payer: Self-pay

## 2021-03-13 ENCOUNTER — Other Ambulatory Visit: Payer: Self-pay | Admitting: Orthopedic Surgery

## 2021-03-13 DIAGNOSIS — M150 Primary generalized (osteo)arthritis: Secondary | ICD-10-CM

## 2021-03-13 DIAGNOSIS — M159 Polyosteoarthritis, unspecified: Secondary | ICD-10-CM

## 2021-03-13 MED ORDER — HYDROCODONE-ACETAMINOPHEN 10-325 MG PO TABS: 1.0000 | ORAL_TABLET | Freq: Three times a day (TID) | ORAL | 0 refills | Status: DC

## 2021-03-13 NOTE — Telephone Encounter (Signed)
I called [facility], spoke with [place].  Jenny Reichmann states a rx for change in instructions was never submitted to [facility] nor was an order submitted to them that reflects that patient can take mediation three times. Jenny Reichmann states the patient is only receiving medication two times daily, as that is the last order they have on file.   Jenny Reichmann states they do not need more pills at this time as the patient had 120- something pills, per the morning count. Jenny Reichmann said she is not sure why the instructions were changed as the patient was taking hydrocodone four times a day at one point and she would sleep all day and wake up long enough to take medicine, then go back to sleep.   Jenny Reichmann is concerned about an addition to this medication developing in her professional opinion and questions if the family would like for her to have more medicine so that she will sleep and bother them less.  Jenny Reichmann states the course of action that needs to take place if [name] would like for patient to take 3 x daily is a new rx would need to be submitted to [facility] and an order would need to be faxed to them, otherwise patient will continue to receive only twice daily as last order states.   Jenny Reichmann stated patients daughter bought them a print out of a conversation between [place], [place], and herself [name]), yet that is not sufficient to change the medication, they would need an actual order  Message routed to Yvonna Alanis to review and advise

## 2021-03-13 NOTE — Telephone Encounter (Signed)
Joy Ward states no order was ever received to change the instructions for Hydrocodone.  Order sheet written for provider to complete (fax cover sheet attached) and then to be faxed by the administrative staff.  Placed in providers review and sign folder  Once order is faxed it should go to scanning

## 2021-03-13 NOTE — Telephone Encounter (Signed)
Patients daughter called stating she spoke with [facility] this morning and they have not received script that [name] said she would send to them yesterday (see mychart message)   I spoke with the administrative staff to discuss if they had fax a rx on patient and if so I needed it to refax. Per Lovena Le, nothing was faxed on patient.   [name] asked that I find out from [name] if rx was actually faxed. Fax number from [place] message was verified.

## 2021-03-13 NOTE — Telephone Encounter (Signed)
New prescription sent to [facility]. New order from [place] has already been faxed earlier this week.Initially reduced hydrocodone because she is sleeping throughout the day. After reducing medication, she began to have increased pain.  Please contact provider if increased dose makes her drowsy.

## 2021-03-14 NOTE — Telephone Encounter (Signed)
Spoke with [name] and gave her an update about order and rx request completed

## 2021-03-31 ENCOUNTER — Encounter: Payer: Self-pay | Admitting: Orthopedic Surgery

## 2021-04-06 ENCOUNTER — Ambulatory Visit (HOSPITAL_COMMUNITY): Payer: Medicare Other

## 2021-04-07 ENCOUNTER — Other Ambulatory Visit: Payer: Self-pay | Admitting: Orthopedic Surgery

## 2021-04-07 ENCOUNTER — Telehealth: Payer: Self-pay | Admitting: *Deleted

## 2021-04-07 DIAGNOSIS — M159 Polyosteoarthritis, unspecified: Secondary | ICD-10-CM

## 2021-04-07 MED ORDER — HYDROCODONE-ACETAMINOPHEN 10-325 MG PO TABS: 1.0000 | ORAL_TABLET | Freq: Two times a day (BID) | ORAL | 0 refills | Status: DC

## 2021-04-07 NOTE — Progress Notes (Signed)
[place] gave update on patient condition. Reports she is sleeping more during the day with increased hydrocodone dose. New prescription for reduced Hydrocodone sent to [facility].

## 2021-04-07 NOTE — Telephone Encounter (Signed)
Marry Guan with [facility] is calling requesting to speak with [name] Directly.  Stated that she has concerns with patient's medications and wants to speak with you. Stated that there is confusion with all of them.   Please Call #[phone]-Cindy

## 2021-04-07 NOTE — Telephone Encounter (Signed)
Please send new orders for hydrocodone to [facility].

## 2021-04-08 ENCOUNTER — Other Ambulatory Visit: Payer: Self-pay | Admitting: Orthopedic Surgery

## 2021-04-08 DIAGNOSIS — M159 Polyosteoarthritis, unspecified: Secondary | ICD-10-CM

## 2021-04-08 MED ORDER — HYDROCODONE-ACETAMINOPHEN 10-325 MG PO TABS: 1.0000 | ORAL_TABLET | Freq: Two times a day (BID) | ORAL | 0 refills | Status: DC

## 2021-04-08 NOTE — Telephone Encounter (Signed)
New prescription was sent yesterday to [facility].

## 2021-04-08 NOTE — Telephone Encounter (Signed)
Give every morning and evening.

## 2021-04-08 NOTE — Telephone Encounter (Signed)
Order/copy of Rx faxed to [place] at [facility].

## 2021-04-08 NOTE — Telephone Encounter (Signed)
Cindy with [facility] stated that it has to say 8am and 8pm and to discontinue previous order.

## 2021-04-08 NOTE — Telephone Encounter (Signed)
[name] sent a Secure Chat stated to call Marry Guan with [facility] and have her fax over patient's medication list to sign and update.   Called Marry Guan [phone] Jenny Reichmann stated that she will fax the medication orders. Jenny Reichmann stated that they will give patient's Hydrocodone at 8am and 8pm but needs a new Rx faxed to the pharmacy ([facility]) and fax a copy to their facility at Fax: [phone]  Pended Rx and sent to [name] for approval.

## 2021-04-08 NOTE — Telephone Encounter (Signed)
New prescription sent. I discontinued previous hydrocodone order. Can we pull that up and fax it to [facility]. Daughter has also been faxing paperwork to office. Can we make sure Jenny Reichmann had sent paperwork?

## 2021-04-08 NOTE — Telephone Encounter (Signed)
[name], it will not give me access to print the Rx to fax to [facility]. I have set the Rx to Print if you will sign off on it, it should print here and I can fax to facility.

## 2021-04-08 NOTE — Telephone Encounter (Signed)
Received Medication List from [facility] and placed in [name]'s Folder to review and sign.

## 2021-04-08 NOTE — Telephone Encounter (Signed)
HYDROcodone-acetaminophen (NORCO) 10-325 MG tablet 1 tablet, 2 times daily Take 1 tablet by mouth 2 (two) times daily. 0 ordered        Does this need to include any time of the day to take? Please Advise.

## 2021-04-09 ENCOUNTER — Telehealth: Payer: Self-pay | Admitting: *Deleted

## 2021-04-09 NOTE — Telephone Encounter (Signed)
She is scheduled to be seen tomorrow. I plan to discuss medication times with daughter. Can they please send packet with her or fax order sheet to [place]. I will make adjustments tomorrow. Please keep medications times for today and tomorrow.

## 2021-04-09 NOTE — Telephone Encounter (Signed)
Cindy with [facility] and agreed. Stated she will send packet with patient.

## 2021-04-09 NOTE — Telephone Encounter (Signed)
Cindy with [facility] called and stated that Family is sharing Concern with the time of taking the Hydrocodone at 8am and 8pm. Stated that patient is also on a schedule to take Tylenol 650mg  at 8am and 8pm  and family is concern with patient getting too much Tylenol at one time.   Jenny Reichmann is wanting to know if the times can be changed for the Tylenol or Discontinued since they have a standing order for it.   Please Advise.   [facility] Fax: [phone]

## 2021-04-11 ENCOUNTER — Other Ambulatory Visit: Payer: Self-pay

## 2021-04-11 ENCOUNTER — Ambulatory Visit (HOSPITAL_COMMUNITY)
Admission: EM | Admit: 2021-04-11 | Discharge: 2021-04-11 | Disposition: A | Discharge status: Home / Self Care | Payer: Medicare Other | Attending: Student | Admitting: Student

## 2021-04-11 ENCOUNTER — Encounter (HOSPITAL_COMMUNITY): Payer: Self-pay | Admitting: Emergency Medicine

## 2021-04-11 DIAGNOSIS — R350 Frequency of micturition: Secondary | ICD-10-CM | POA: Diagnosis not present

## 2021-04-11 LAB — POCT URINALYSIS DIPSTICK, ED / UC
Bilirubin Urine: NEGATIVE
Glucose, UA: NEGATIVE mg/dL
Leukocytes,Ua: NEGATIVE
Nitrite: NEGATIVE
Protein, ur: NEGATIVE mg/dL
Specific Gravity, Urine: 1.025 (ref 1.005–1.030)
Urobilinogen, UA: 1 mg/dL (ref 0.0–1.0)
pH: 5.5 (ref 5.0–8.0)

## 2021-04-11 NOTE — ED Provider Notes (Signed)
[facility]    CSN: [id number] Arrival date & time: 04/11/21  1915      History   Chief Complaint Chief Complaint  Patient presents with   Urinary Frequency   Hallucinations    HPI Joy Ward is a 80 y.o. female presenting with about 3 weeks of urinary frequency with worsening memory loss.  Here today with daughter.  Patient typically lives at care home, brought in by daughter today.  Daughter states that the sundowning has been worse than normal, and she is peeing more often.  Denies any other symptoms including fever/chills, abdominal pain, flank pain, hematuria.  States that she is behaving differently than normal but unable to specify how.  Denies dizziness, weakness, chest pain, shortness of breath, fever/chills.  HPI  Past Medical History:  Diagnosis Date   Allergy    Arthritis    Chronic back pain    DDD (degenerative disc disease), lumbar    Diabetes mellitus without complication ([place])    Diabetes type 2, uncontrolled 09/18/2006   Annotation: Noninsulin dependent Qualifier: Diagnosis of  By: Amil Amen MD, Benjamine Mola     Former tobacco use    Hyperlipidemia    a. H/o muscle aches with statins per PCP notes.   Hypertension    Hypothyroidism    a. prior thyroid removal.   Memory loss or impairment 03/29/2019   Stroke [facility])    a. Listed in PCP notes: "History of stroke:  History of terrible headache.  Had a CT scan of brain and found "ministroke"  Was removed from ERT subsequently. "    Patient Active Problem List   Diagnosis Date Noted   Pain due to onychomycosis of toenails of both feet 01/15/2021   Ataxia 04/07/2020   Weakness generalized 04/07/2020   Weakness 04/07/2020   Carpal tunnel syndrome of left wrist 02/20/2020   Diabetic polyneuropathy associated with type 2 diabetes mellitus ([place]) 02/20/2020   Lower GI bleed 07/31/2019   Syncope 07/30/2019   Pancytopenia ([place]) 07/30/2019   Hyperchloremia 09/32/6712   Metabolic acidosis 45/80/9983    Hypocalcemia 07/30/2019   Palliative care patient 07/03/2019   Hallucinations 38/25/0539   Metabolic encephalopathy 76/73/4193   Altered mental status    Recurrent UTI 04/30/2019   Vascular dementia with behavior disturbance 04/14/2019   Primary insomnia 04/14/2019   Hypothyroidism 04/14/2019   Diabetes mellitus ([place]) 04/14/2019   Memory loss or impairment 03/29/2019   Diabetic peripheral neuropathy associated with type 2 diabetes mellitus ([place]) 12/29/2017   Peripheral edema 12/29/2017   Elevated liver enzymes 10/15/2016   Chest pain 79/05/4095   Systolic murmur 35/32/9924   Hypercalcemia 04/22/2016   Hyperlipidemia 04/22/2016   Baker's cyst of knee, right 04/21/2016   HYPOKALEMIA 01/24/2010   CONSTIPATION 12/23/2009   INTERMITTENT VERTIGO 12/23/2009   SINUSITIS, ACUTE 04/25/2009   VAGINITIS, CANDIDAL 02/14/2009   HIP PAIN, RIGHT 02/14/2009   HYPERLIPIDEMIA, MIXED 01/01/2009   BACK PAIN, LUMBAR, WITH RADICULOPATHY 12/25/2008   KNEE PAIN, BILATERAL 12/18/2008   TROCHANTERIC BURSITIS, BILATERAL 12/18/2008   Disturbance in sleep behavior 12/18/2008   ONYCHOMYCOSIS, TOENAILS 10/30/2008   ENTERITIS, CLOSTRIDIUM DIFFICILE 10/15/2006   Diabetes type 2, uncontrolled 09/18/2006   Essential hypertension 09/18/2006   Allergic rhinitis 09/18/2006   GOITER, MULTINODULAR 06/29/2006   STROKE 04/27/2001    Past Surgical History:  Procedure Laterality Date   ABDOMINAL HYSTERECTOMY  1994   TAH, not clear if unilateral oophorectomy as well.   [place]  1973   open   [place] LEVEL 4  2005   In [place]  2012   multinodular goiter    OB History   No obstetric history on file.      Home Medications    Prior to Admission medications   Medication Sig Start Date End Date Taking? Authorizing Provider  acetaminophen (TYLENOL) 325 MG tablet Take 2 tablets (650 mg total) by mouth 2 (two) times  daily. 03/05/21   Fargo, [name] E, NP  aluminum-magnesium hydroxide-simethicone (MAALOX) [phone] MG/5ML SUSP Take 30 mLs by mouth every 6 (six) hours as needed (heartburn/indigestion).    [provider]  amLODipine (NORVASC) 5 MG tablet TAKE 1 TABLET BY MOUTH EVERY DAY AFTER LUNCH 02/11/21   Wardell Honour, MD  Calcium Carbonate-Vit D-Min (CALTRATE 600+D PLUS MINERALS) 600-800 MG-UNIT TABS Take 600 mg by mouth daily after lunch. 12/19/20   Fargo, [name] E, NP  diclofenac Sodium (VOLTAREN) 1 % GEL Apply 4 g topically 3 (three) times daily. To lower back, left hand, and fingers 05/29/20   Ngetich, Dinah C, NP  docusate sodium (COLACE) 250 MG capsule Take 1 capsule (250 mg total) by mouth daily as needed for constipation. 10/16/20   Hughie Closs, PA-C  donepezil (ARICEPT) 10 MG tablet TAKE 1 TABLET BY MOUTH AT BEDTIME 12/02/20   Wardell Honour, MD  famotidine (PEPCID) 40 MG tablet Take 1 tablet (40 mg total) by mouth daily. 03/05/21   Fargo, [name] E, NP  HYDROcodone-acetaminophen (NORCO) 10-325 MG tablet Take 1 tablet by mouth 2 (two) times daily. Please give at [place] 04/08/21   Windell Moulding E, NP  levothyroxine (SYNTHROID) 100 MCG tablet Take 1 tablet (100 mcg total) by mouth daily. 02/28/21   Fargo, [name] E, NP  loratadine (ALLERGY RELIEF) 10 MG tablet Take 1 tablet (10 mg total) by mouth daily after lunch. 08/29/20   Fargo, [name] E, NP  metFORMIN (GLUCOPHAGE) 500 MG tablet TAKE 1 TABLET BY MOUTH EVERY DAY AFTER LUNCH 12/02/20   Wardell Honour, MD  Neomycin-Bacitracin-Polymyxin (TRIPLE ANTIBIOTIC) [phone] OINT Apply 1 application topically as needed (minor skin tears/abrasions).    [provider]  pantoprazole (PROTONIX) 40 MG tablet TAKE 1 TABLET BY MOUTH EVERY DAY 01/20/21   Wardell Honour, MD  polyethylene glycol powder (GLYCOLAX/MIRALAX) 17 GM/SCOOP powder Take 17 g by mouth every other day. 02/27/21   Fargo, [name] E, NP  potassium chloride SA (KLOR-CON) 20 MEQ tablet Take 1  tablet (20 mEq total) by mouth daily after lunch. 08/29/20   Yvonna Alanis, NP  QUEtiapine (SEROQUEL) 100 MG tablet TAKE 1 TABLET BY MOUTH AT BEDTIME 09/04/20   Wardell Honour, MD  traZODone (DESYREL) 100 MG tablet Take 1 tablet (100 mg total) by mouth at bedtime. for sleep 10/20/20   Isla Pence, MD    Family History Family History  Problem Relation Age of Onset   Heart disease Mother        unclear details "atherosclerosis"   Diabetes Mother    Alcohol abuse Sister    Diabetes Brother    Diabetes Brother    Drug abuse Brother    Diabetes Daughter    Heart disease Son        CHF   Alcohol abuse Son     Social History Social History   Tobacco Use   Smoking status: Former   Smokeless tobacco: Never   Tobacco comments:    No  smoking since 79 yo - smoked 10 yrs  Vaping Use   Vaping Use: Never used  Substance Use Topics   Alcohol use: No   Drug use: No     Allergies   Clindamycin hcl, Sulfamethoxazole-trimethoprim, Zanaflex [tizanidine hcl], Metronidazole, Statins, Bee venom, Ace inhibitors, and Adhesive [tape]   Review of Systems Review of Systems  Constitutional:  Negative for appetite change, chills, diaphoresis and fever.  Respiratory:  Negative for shortness of breath.   Cardiovascular:  Negative for chest pain.  Gastrointestinal:  Negative for abdominal pain, blood in stool, constipation, diarrhea, nausea and vomiting.  Genitourinary:  Negative for decreased urine volume, difficulty urinating, dysuria, flank pain, frequency, genital sores, hematuria and urgency.  Musculoskeletal:  Negative for back pain.  Neurological:  Negative for dizziness, weakness and light-headedness.  All other systems reviewed and are negative.   Physical Exam Triage Vital Signs ED Triage Vitals  Enc Vitals Group     BP 04/11/21 1933 (!) 151/92     Pulse Rate 04/11/21 1933 85     Resp 04/11/21 1933 19     Temp 04/11/21 1933 98.2 F (36.8 C)     Temp Source 04/11/21 1933 Oral      SpO2 04/11/21 1933 98 %     Weight --      Height --      Head Circumference --      Peak Flow --      Pain Score 04/11/21 1932 0     Pain Loc --      Pain Edu? --      Excl. in [place]? --    No data found.  Updated Vital Signs BP (!) 151/92 (BP Location: Left Arm)    Pulse 85    Temp 98.2 F (36.8 C) (Oral)    Resp 19    SpO2 98%   Visual Acuity Right Eye Distance:   Left Eye Distance:   Bilateral Distance:    Right Eye Near:   Left Eye Near:    Bilateral Near:     Physical Exam Vitals reviewed.  Constitutional:      General: She is not in acute distress.    Appearance: Normal appearance. She is not ill-appearing.  HENT:     Head: Normocephalic and atraumatic.     Mouth/Throat:     Mouth: Mucous membranes are moist.     Comments: Moist mucous membranes Eyes:     Extraocular Movements: Extraocular movements intact.     Pupils: Pupils are equal, round, and reactive to light.  Cardiovascular:     Rate and Rhythm: Normal rate and regular rhythm.     Heart sounds: Normal heart sounds.  Pulmonary:     Effort: Pulmonary effort is normal.     Breath sounds: Normal breath sounds. No wheezing, rhonchi or rales.  Abdominal:     General: Bowel sounds are normal. There is no distension.     Palpations: Abdomen is soft. There is no mass.     Tenderness: There is no abdominal tenderness. There is no right CVA tenderness, left CVA tenderness, guarding or rebound.  Skin:    General: Skin is warm.     Capillary Refill: Capillary refill takes less than 2 seconds.     Comments: Good skin turgor  Neurological:     General: No focal deficit present.     Mental Status: She is alert and oriented to person, place, and time.  Psychiatric:  Mood and Affect: Mood normal.        Behavior: Behavior normal.     UC Treatments / Results  Labs (all labs ordered are listed, but only abnormal results are displayed) Labs Reviewed  POCT URINALYSIS DIPSTICK, ED / UC - Abnormal;  Notable for the following components:      Result Value   Ketones, ur TRACE (*)    Hgb urine dipstick TRACE (*)    All other components within normal limits  URINE CULTURE    EKG   Radiology No results found.  Procedures Procedures (including critical care time)  Medications Ordered in UC Medications - No data to display  Initial Impression / Assessment and Plan / UC Course  I have reviewed the triage vital signs and the nursing notes.  Pertinent labs & imaging results that were available during my care of the patient were reviewed by me and considered in my medical decision making (see chart for details).     This patient is a very pleasant 80 y.o. year old female presenting with urinary frequency. Afebrile, nontachycardic, no reproducible abd pain or CVAT.  UA wnl, unfortunately patient could not provide enough urine to send culture.   F/u with PCP for routine labwork and recheck.   ED return precautions discussed. Daughter verbalizes understanding and agreement.     Final Clinical Impressions(s) / UC Diagnoses   Final diagnoses:  Urinary frequency     Discharge Instructions      Please call your primary care doctor on Monday (next business day) to schedule follow-up   ED Prescriptions   None    PDMP not reviewed this encounter.   Hazel Sams, PA-C 04/11/21 2012

## 2021-04-11 NOTE — ED Triage Notes (Signed)
Pt's visitor reports that since Thanksgiving (that she is aware of since pt lives in assisted living facility) patient having urinary frequency and urgency and some hallucinations.

## 2021-04-11 NOTE — Discharge Instructions (Signed)
Please call your primary care doctor on Monday (next business day) to schedule follow-up

## 2021-04-17 ENCOUNTER — Ambulatory Visit: Payer: Medicare Other | Admitting: Orthopedic Surgery

## 2021-05-19 ENCOUNTER — Ambulatory Visit: Payer: Medicare Other | Admitting: Podiatry

## 2021-05-19 ENCOUNTER — Other Ambulatory Visit: Payer: Self-pay | Admitting: Orthopedic Surgery

## 2021-05-19 ENCOUNTER — Ambulatory Visit: Payer: Medicare Other | Admitting: Orthopedic Surgery

## 2021-05-19 DIAGNOSIS — Z780 Asymptomatic menopausal state: Secondary | ICD-10-CM

## 2021-05-19 IMAGING — CT CT HEAD W/O CM
3 series · 14 of 47 positions shown, 16 images · non-contrast
Comparison: 04/30/2019, 04/13/2019, 07/06/2016

CLINICAL DATA: Head trauma dementia

EXAM:
CT HEAD WITHOUT CONTRAST
TECHNIQUE: Contiguous axial images were obtained from the base of the skull
through the vertex without intravenous contrast.

[Series 3: head 5.0 h30s · axial · 0.44mm/px · z∈[+1411,+1541]mm · 8 of 32 slices shown, 10 images]
[im 3/32  brain]
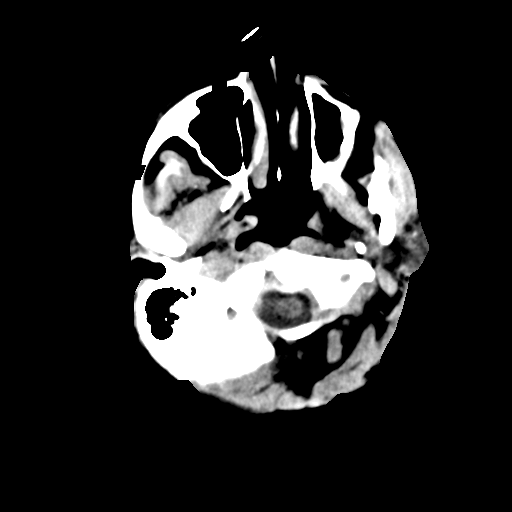
[im 3/32  bone]
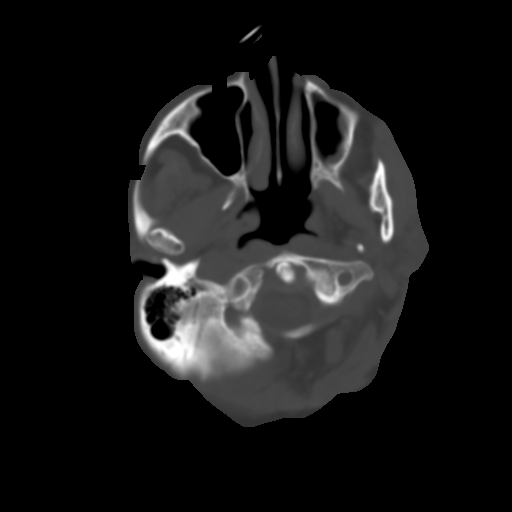
[im 7/32  brain]
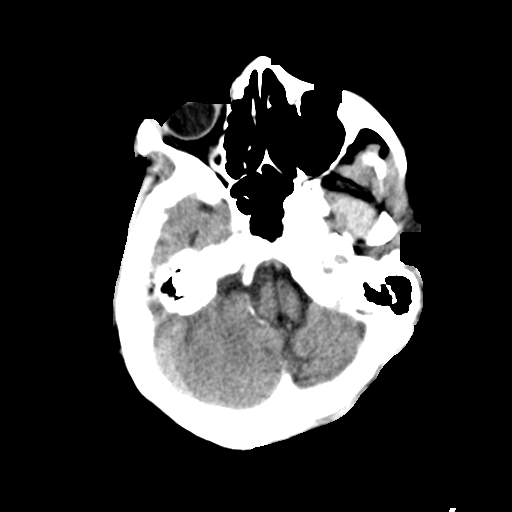
[im 10/32  brain]
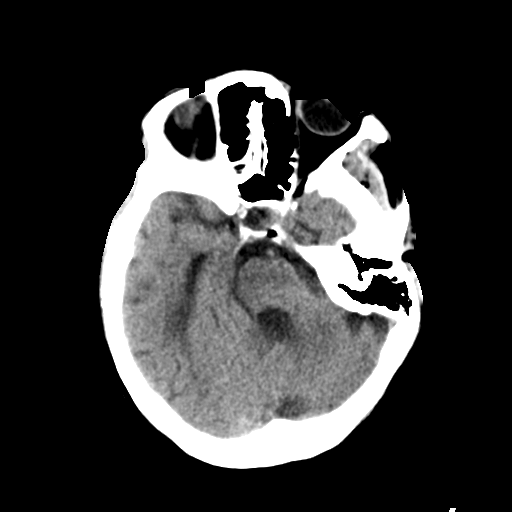
[im 14/32  brain]
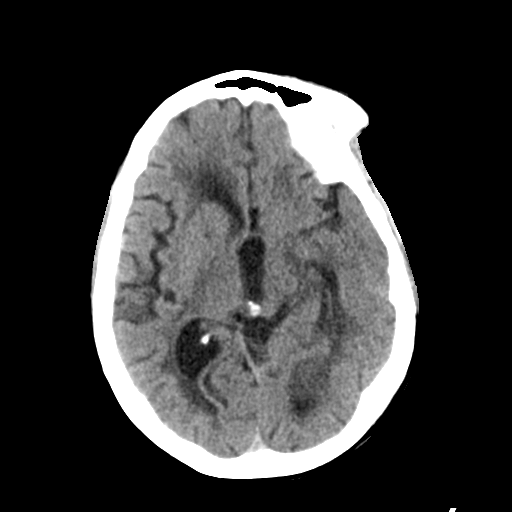
[im 18/32  brain]
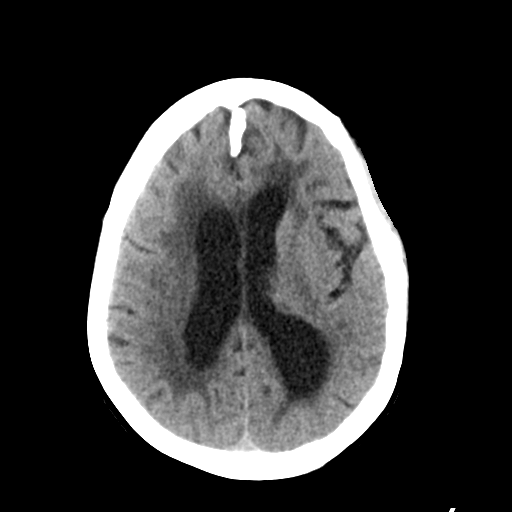
[im 18/32  bone]
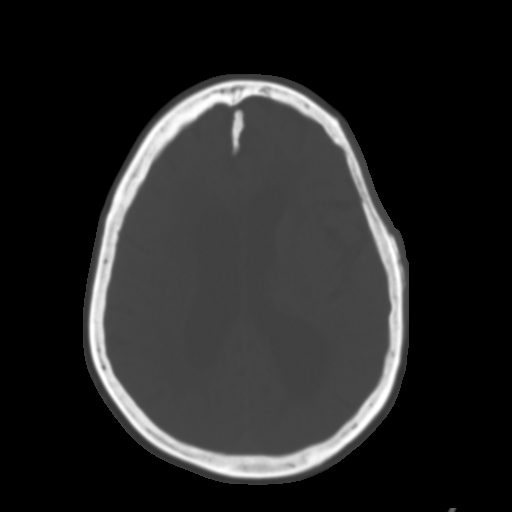
[im 22/32  brain]
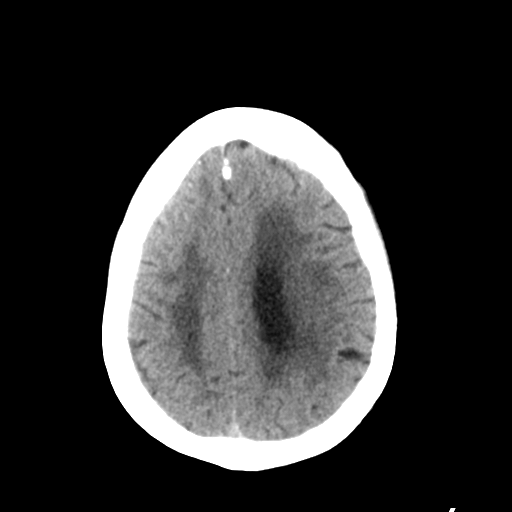
[im 25/32  brain]
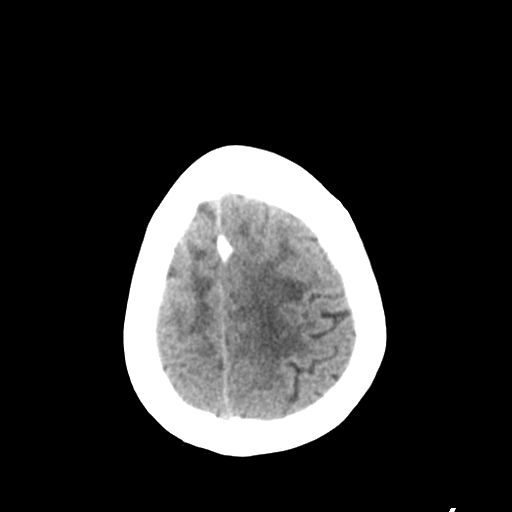
[im 29/32  brain]
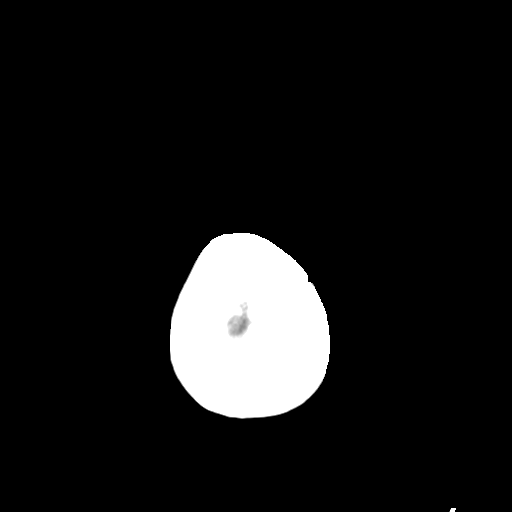

[Series 5: head 3.0 mpr cor · coronal · 0.31mm/px · 3 of 67 slices shown]
[im 23/67  brain]
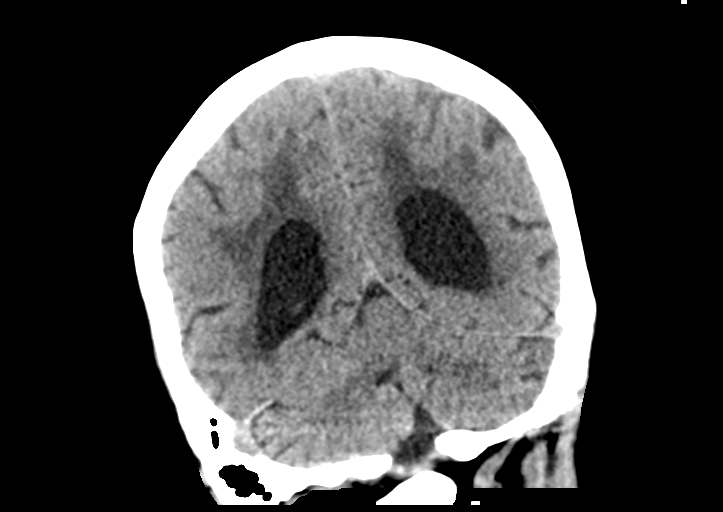
[im 30/67  brain]
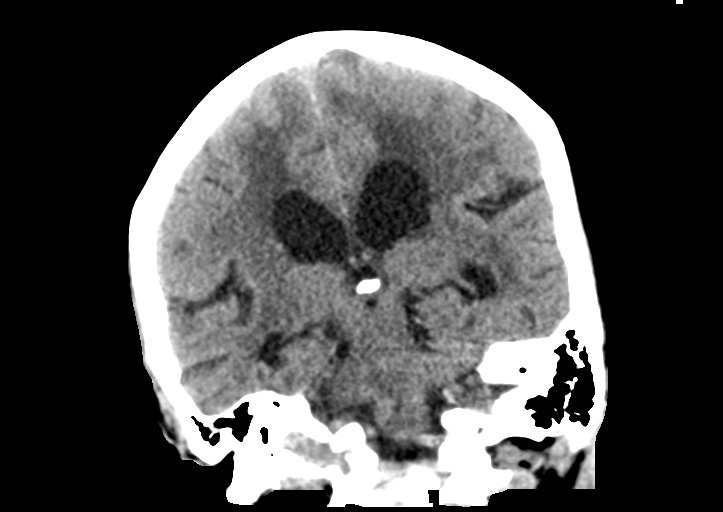
[im 37/67  brain]
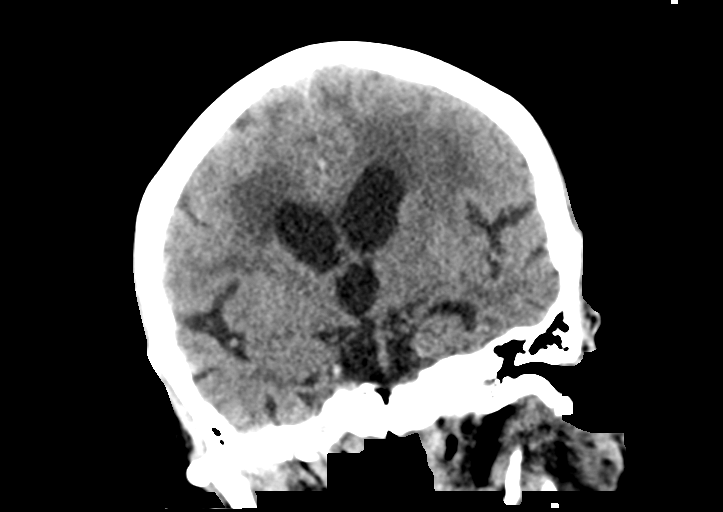

[Series 6: head 3.0 mpr sag · sagittal · 0.31mm/px · 3 of 63 slices shown]
[im 21/63  brain]
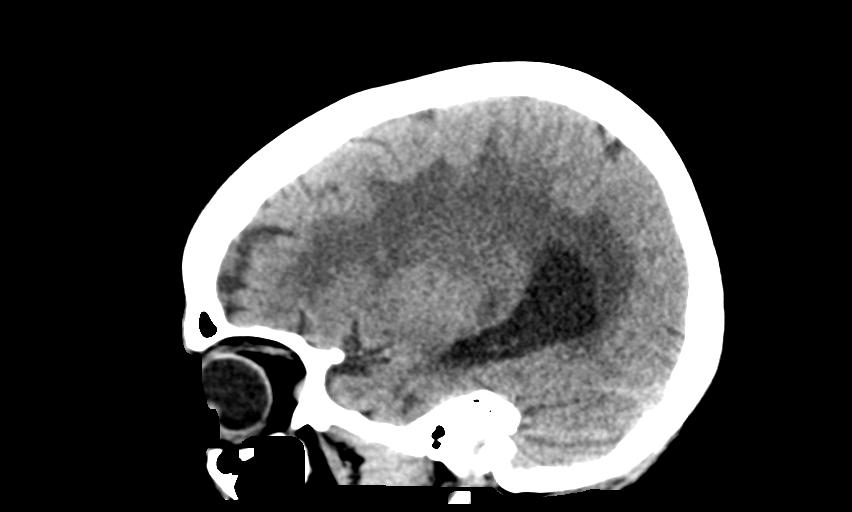
[im 30/63  brain]
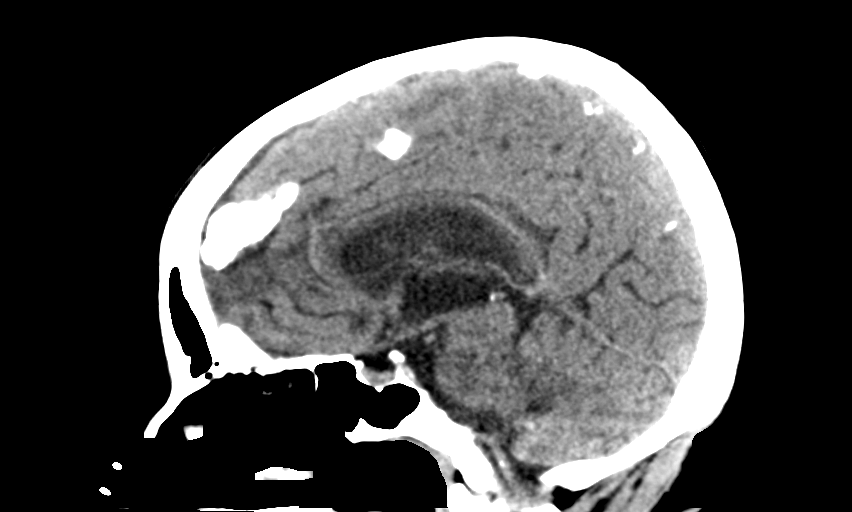
[im 38/63  brain]
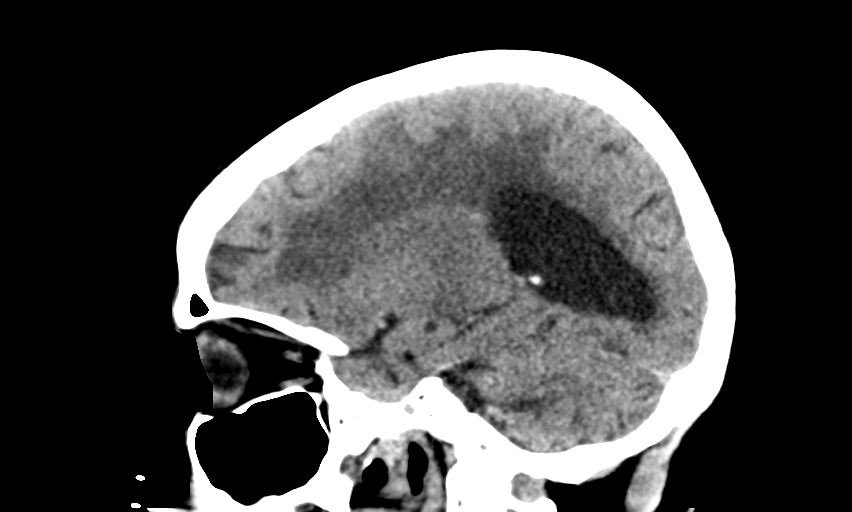

[14 of 47 positions shown; findings below may reference images not displayed]

FINDINGS: Brain: No acute territorial infarction, hemorrhage, or intracranial
mass is visualized. Atrophy. Extensive hypodensity within the
subcortical, periventricular and deep white matter consistent with
chronic small vessel ischemic change. Chronic appearing lacunar
infarcts in the basal ganglia. Stable ventricle size.

Vascular: No hyperdense vessels.  Carotid vascular calcification

Skull: Normal. Negative for fracture or focal lesion.

Sinuses/Orbits: Mucous retention cysts and mucosal thickening in the
left maxillary sinus.

Other: None
IMPRESSION: 1. No CT evidence for acute intracranial abnormality.
2. Chronic small vessel ischemic change of the white matter.
Atrophy.

## 2021-05-29 ENCOUNTER — Encounter: Payer: Medicare Other | Admitting: Orthopedic Surgery

## 2021-05-29 ENCOUNTER — Other Ambulatory Visit: Payer: Self-pay | Admitting: Orthopedic Surgery

## 2021-05-29 DIAGNOSIS — M159 Polyosteoarthritis, unspecified: Secondary | ICD-10-CM

## 2021-05-29 NOTE — Telephone Encounter (Signed)
Patient has request refill on medication "Hydrocodone". Patient last refill dated 04/08/2021. Patient has NO contract on file. Patient being seen in office today 05/29/2021. Sign contract already added to patient appointment notes. Medication pend and sent to PCP Yvonna Alanis, NP .

## 2021-05-30 NOTE — Progress Notes (Signed)
This encounter was created in error - please disregard.

## 2021-06-05 ENCOUNTER — Ambulatory Visit: Payer: Medicare Other | Admitting: Orthopedic Surgery

## 2021-06-12 ENCOUNTER — Other Ambulatory Visit: Payer: Self-pay

## 2021-06-12 ENCOUNTER — Ambulatory Visit (INDEPENDENT_AMBULATORY_CARE_PROVIDER_SITE_OTHER): Payer: Medicare Other | Admitting: Orthopedic Surgery

## 2021-06-12 ENCOUNTER — Encounter: Payer: Self-pay | Admitting: Orthopedic Surgery

## 2021-06-12 ENCOUNTER — Other Ambulatory Visit: Payer: Self-pay | Admitting: Family Medicine

## 2021-06-12 VITALS — BP 132/86 | HR 71 | Temp 97.1°F | Resp 18 | Ht 64.0 in | Wt 152.0 lb

## 2021-06-12 DIAGNOSIS — M159 Polyosteoarthritis, unspecified: Secondary | ICD-10-CM

## 2021-06-12 DIAGNOSIS — E039 Hypothyroidism, unspecified: Secondary | ICD-10-CM | POA: Diagnosis not present

## 2021-06-12 DIAGNOSIS — K219 Gastro-esophageal reflux disease without esophagitis: Secondary | ICD-10-CM

## 2021-06-12 DIAGNOSIS — R748 Abnormal levels of other serum enzymes: Secondary | ICD-10-CM

## 2021-06-12 DIAGNOSIS — I10 Essential (primary) hypertension: Secondary | ICD-10-CM

## 2021-06-12 DIAGNOSIS — L989 Disorder of the skin and subcutaneous tissue, unspecified: Secondary | ICD-10-CM

## 2021-06-12 DIAGNOSIS — R350 Frequency of micturition: Secondary | ICD-10-CM | POA: Diagnosis not present

## 2021-06-12 DIAGNOSIS — F01518 Vascular dementia, unspecified severity, with other behavioral disturbance: Secondary | ICD-10-CM

## 2021-06-12 LAB — POCT URINALYSIS DIPSTICK
Bilirubin, UA: POSITIVE
Glucose, UA: NEGATIVE
Ketones, UA: POSITIVE
Nitrite, UA: POSITIVE
Protein, UA: POSITIVE — AB
Spec Grav, UA: 1.025 (ref 1.010–1.025)
Urobilinogen, UA: NEGATIVE E.U./dL — AB
pH, UA: 6 (ref 5.0–8.0)

## 2021-06-12 MED ORDER — ACETAMINOPHEN 325 MG PO TABS: 650.0000 mg | ORAL_TABLET | Freq: Two times a day (BID) | ORAL | 5 refills | Status: DC

## 2021-06-12 NOTE — Patient Instructions (Addendum)
[facility]- [phone]

## 2021-06-12 NOTE — Progress Notes (Signed)
Careteam: Patient Care Team: Yvonna Alanis, NP as PCP - General (Adult Health Nurse Practitioner)  Seen by: Windell Moulding, AGNP-C  PLACE OF SERVICE:  [place] Directive information Does Patient Have a Medical Advance Directive?: No, Would patient like information on creating a medical advance directive?: No - Patient declined  Allergies  Allergen Reactions   Clindamycin Hcl Other (See Comments)    REACTION: C. difficile colitis   Sulfamethoxazole-Trimethoprim Other (See Comments)     Eye redness   Zanaflex [Tizanidine Hcl] Anaphylaxis   Metronidazole Hives   Statins Other (See Comments)    Side Effect: muscle pain Has tried Zocor, Crestor--she does not want to take a statin, period   Bee Venom Other (See Comments)    Unknown reaction   Ace Inhibitors Cough   Adhesive [Tape] Itching, Rash and Other (See Comments)    Blistering of skin     Chief Complaint  Patient presents with   Acute Visit    Thyroid needs to be checked, bruising on her leg, and having trouble using the restroom.      HPI: Patient is a 81 y.o. female seen today for medical management of chronic conditions.   She is a poor historian due to dementia. Granddaughter present during encounter, daughter present via telephone.   No behavioral outbursts. She continues to live at [facility]. Family reports inconsistent medication administration times from nursing staff.   Requesting thyroid be rechecked. She is sleeping more often. Unsure if facility is administering levothyroxine on empty stomach.   Right anterior shin with irregular shaped mole, tender to touch. Requesting dermatology referral.   She is having more incontinent episodes. Family concerned for UTI, requesting urine culture.    Review of Systems:  Review of Systems  Unable to perform ROS: Dementia   Past Medical History:  Diagnosis Date   Allergy    Arthritis    Chronic back pain    DDD (degenerative disc disease),  lumbar    Diabetes mellitus without complication ([place])    Diabetes type 2, uncontrolled 09/18/2006   Annotation: Noninsulin dependent Qualifier: Diagnosis of  By: Amil Amen MD, Benjamine Mola     Former tobacco use    Hyperlipidemia    a. H/o muscle aches with statins per PCP notes.   Hypertension    Hypothyroidism    a. prior thyroid removal.   Memory loss or impairment 03/29/2019   Stroke [facility])    a. Listed in PCP notes: "History of stroke:  History of terrible headache.  Had a CT scan of brain and found "ministroke"  Was removed from ERT subsequently. "   Past Surgical History:  Procedure Laterality Date   ABDOMINAL HYSTERECTOMY  1994   TAH, not clear if unilateral oophorectomy as well.   [place]   open   [place] LEVEL 4  2005   In [place]  2012   multinodular goiter   Social History:   reports that she has quit smoking. She has never used smokeless tobacco. She reports that she does not drink alcohol and does not use drugs.  Family History  Problem Relation Age of Onset   Heart disease Mother        unclear details "atherosclerosis"   Diabetes Mother    Alcohol abuse Sister    Diabetes Brother    Diabetes Brother    Drug abuse Brother  Diabetes Daughter    Heart disease Son        CHF   Alcohol abuse Son     Medications: Patient's Medications  New Prescriptions   No medications on file  Previous Medications   ACETAMINOPHEN (TYLENOL) 325 MG TABLET    Take 2 tablets (650 mg total) by mouth 2 (two) times daily.   ALUMINUM-MAGNESIUM HYDROXIDE-SIMETHICONE (MAALOX) 379-024-09 MG/5ML SUSP    Take 30 mLs by mouth every 6 (six) hours as needed (heartburn/indigestion).   AMLODIPINE (NORVASC) 5 MG TABLET    TAKE 1 TABLET BY MOUTH EVERY DAY AFTER LUNCH   CALCIUM CARBONATE-VIT D-MIN (CALTRATE 600+D PLUS MINERALS) 600-800 MG-UNIT TABS    TAKE 1 TABLET BY MOUTH EVERY DAY AFTER LUNCH    DICLOFENAC SODIUM (VOLTAREN) 1 % GEL    Apply 4 g topically 3 (three) times daily. To lower back, left hand, and fingers   DOCUSATE SODIUM (COLACE) 250 MG CAPSULE    Take 1 capsule (250 mg total) by mouth daily as needed for constipation.   DONEPEZIL (ARICEPT) 10 MG TABLET    TAKE 1 TABLET BY MOUTH AT BEDTIME   FAMOTIDINE (PEPCID) 40 MG TABLET    Take 1 tablet (40 mg total) by mouth daily.   HYDROCODONE-ACETAMINOPHEN (NORCO) 10-325 MG TABLET    TAKE 1 TABLET BY MOUTH 2 TIMES DAILY AT 8 am and 8 pm   LEVOTHYROXINE (SYNTHROID) 100 MCG TABLET    Take 1 tablet (100 mcg total) by mouth daily.   LORATADINE (ALLERGY RELIEF) 10 MG TABLET    Take 1 tablet (10 mg total) by mouth daily after lunch.   METFORMIN (GLUCOPHAGE) 500 MG TABLET    TAKE 1 TABLET BY MOUTH EVERY DAY AFTER LUNCH   NEOMYCIN-BACITRACIN-POLYMYXIN (TRIPLE ANTIBIOTIC) [phone] OINT    Apply 1 application topically as needed (minor skin tears/abrasions).   PANTOPRAZOLE (PROTONIX) 40 MG TABLET    TAKE 1 TABLET BY MOUTH EVERY DAY   POLYETHYLENE GLYCOL POWDER (GLYCOLAX/MIRALAX) 17 GM/SCOOP POWDER    Take 17 g by mouth every other day.   POTASSIUM CHLORIDE SA (KLOR-CON) 20 MEQ TABLET    Take 1 tablet (20 mEq total) by mouth daily after lunch.   QUETIAPINE (SEROQUEL) 100 MG TABLET    TAKE 1 TABLET BY MOUTH AT BEDTIME   TRAZODONE (DESYREL) 100 MG TABLET    Take 1 tablet (100 mg total) by mouth at bedtime. for sleep  Modified Medications   No medications on file  Discontinued Medications   No medications on file    Physical Exam:  There were no vitals filed for this visit. There is no height or weight on file to calculate BMI. Wt Readings from Last 3 Encounters:  02/27/21 150 lb 6.4 oz (68.2 kg)  01/03/21 168 lb 14 oz (76.6 kg)  12/12/20 168 lb 12.8 oz (76.6 kg)    Physical Exam Vitals reviewed.  Constitutional:      General: She is not in acute distress. HENT:     Head: Normocephalic.  Eyes:     General:        Right eye: No  discharge.        Left eye: No discharge.  Neck:     Vascular: No carotid bruit.  Cardiovascular:     Rate and Rhythm: Normal rate and regular rhythm.     Pulses: Normal pulses.     Heart sounds: Normal heart sounds. No murmur heard. Pulmonary:     Effort: Pulmonary effort is  normal. No respiratory distress.     Breath sounds: Normal breath sounds. No wheezing.  Abdominal:     General: Bowel sounds are normal. There is no distension.     Palpations: Abdomen is soft.     Tenderness: There is no abdominal tenderness.  Musculoskeletal:     Cervical back: Neck supple.     Right lower leg: No edema.     Left lower leg: No edema.  Lymphadenopathy:     Cervical: No cervical adenopathy.  Skin:    General: Skin is warm and dry.     Capillary Refill: Capillary refill takes less than 2 seconds.     Comments: Dime sized lesion to right anterior shin, asymmetrical, non raised, tender to touch, no skin breakdown.   Neurological:     General: No focal deficit present.     Mental Status: She is alert. Mental status is at baseline.     Motor: Weakness present.     Gait: Gait abnormal.  Psychiatric:        Mood and Affect: Mood normal.        Behavior: Behavior normal.        Cognition and Memory: Memory is impaired.    Labs reviewed: Basic Metabolic Panel: Recent Labs    10/20/20 1203 12/12/20 1554 02/27/21 1606  NA 140 141 140  K 3.9 3.8 3.8  CL 107 104 105  CO2 26 28 25   GLUCOSE 144* 124 161*  BUN 8 10 8   CREATININE 0.62 0.77 0.82  CALCIUM 9.7 9.6 9.7  TSH 1.968 2.36 0.16*   Liver Function Tests: Recent Labs    10/20/20 1203 12/12/20 1554 02/27/21 1606  AST 21 17 15   ALT 34 15 10  ALKPHOS 70  --   --   BILITOT 0.4 0.4 0.4  PROT 6.6 6.7 6.6  ALBUMIN 3.9  --   --    No results for input(s): LIPASE, AMYLASE in the last 8760 hours. No results for input(s): AMMONIA in the last 8760 hours. CBC: Recent Labs    10/20/20 1203 12/12/20 1554 02/27/21 1606  WBC 3.3* 3.8  3.6*  NEUTROABS 1.6* 2,162 1,789  HGB 12.7 13.3 12.5  HCT 38.6 40.4 37.9  MCV 90.2 89.8 89.4  PLT 188 191 201   Lipid Panel: No results for input(s): CHOL, HDL, LDLCALC, TRIG, CHOLHDL, LDLDIRECT in the last 8760 hours. TSH: Recent Labs    10/20/20 1203 12/12/20 1554 02/27/21 1606  TSH 1.968 2.36 0.16*   A1C: Lab Results  Component Value Date   HGBA1C 6.5 (H) 10/24/2020     Assessment/Plan 1. Urine frequency - more incontinent episodes per family - Culture, Urine - > 100,000 staphylococcus epidermidis - POC Urinalysis Dipstick- + nitrates - start cipro 500 mg po bid x 7 days - encourage hydration with water  2. Hypothyroidism, unspecified type - increased fatigue per family - TSH- 0.92 06/12/2021 - cont levothyroxine  3. Gastroesophageal reflux disease without esophagitis - CBC with Differential/Platelet- hgb 12.7 06/12/2021 - cont Protonix  4. Vascular dementia with behavior disturbance - no behavioral outbursts - cont Aricept and Seroquel  5. Essential hypertension - controlled - cont amlodipine - CMP  6. Primary osteoarthritis involving multiple joints - cont hydrocodone - will add tylenol for increased pain - acetaminophen (TYLENOL) 325 MG tablet; Take 2 tablets (650 mg total) by mouth 2 (two) times daily. please give at 2 pm and bedtime  Dispense: 120 tablet; Refill: 5  7. Elevated liver enzymes -  ALT 41 06/12/2021 - hepatic panel- future  8. Skin lesion of right leg - asymmetrical, tender lesion to right anterior shin - referral to dermatology- [facility]  Total time: 37 minutes. Greater than 50% of total time spent doing patient education on health maintenance, urinary frequency, dementia, and medication management.    Next appt: Visit date not found  [place], [facility] [phone]

## 2021-06-13 ENCOUNTER — Other Ambulatory Visit: Payer: Self-pay | Admitting: Orthopedic Surgery

## 2021-06-13 DIAGNOSIS — R748 Abnormal levels of other serum enzymes: Secondary | ICD-10-CM

## 2021-06-13 LAB — CBC WITH DIFFERENTIAL/PLATELET
Absolute Monocytes: 248 cells/uL (ref 200–950)
Basophils Absolute: 31 cells/uL (ref 0–200)
Basophils Relative: 0.9 %
Eosinophils Absolute: 99 cells/uL (ref 15–500)
Eosinophils Relative: 2.9 %
HCT: 38.3 % (ref 35.0–45.0)
Hemoglobin: 12.7 g/dL (ref 11.7–15.5)
Lymphs Abs: 1540 cells/uL (ref 850–3900)
MCH: 29.9 pg (ref 27.0–33.0)
MCHC: 33.2 g/dL (ref 32.0–36.0)
MCV: 90.1 fL (ref 80.0–100.0)
MPV: 11.1 fL (ref 7.5–12.5)
Monocytes Relative: 7.3 %
Neutro Abs: 1482 cells/uL — ABNORMAL LOW (ref 1500–7800)
Neutrophils Relative %: 43.6 %
Platelets: 210 10*3/uL (ref 140–400)
RBC: 4.25 10*6/uL (ref 3.80–5.10)
RDW: 13.1 % (ref 11.0–15.0)
Total Lymphocyte: 45.3 %
WBC: 3.4 10*3/uL — ABNORMAL LOW (ref 3.8–10.8)

## 2021-06-13 LAB — COMPREHENSIVE METABOLIC PANEL
AG Ratio: 1.4 (calc) (ref 1.0–2.5)
ALT: 41 U/L — ABNORMAL HIGH (ref 6–29)
AST: 27 U/L (ref 10–35)
Albumin: 3.8 g/dL (ref 3.6–5.1)
Alkaline phosphatase (APISO): 99 U/L (ref 37–153)
BUN: 9 mg/dL (ref 7–25)
CO2: 28 mmol/L (ref 20–32)
Calcium: 9.4 mg/dL (ref 8.6–10.4)
Chloride: 106 mmol/L (ref 98–110)
Creat: 0.85 mg/dL (ref 0.60–1.00)
Globulin: 2.7 g/dL (calc) (ref 1.9–3.7)
Glucose, Bld: 142 mg/dL — ABNORMAL HIGH (ref 65–139)
Potassium: 4.2 mmol/L (ref 3.5–5.3)
Sodium: 141 mmol/L (ref 135–146)
Total Bilirubin: 0.4 mg/dL (ref 0.2–1.2)
Total Protein: 6.5 g/dL (ref 6.1–8.1)

## 2021-06-13 LAB — TSH: TSH: 0.92 mIU/L (ref 0.40–4.50)

## 2021-06-15 ENCOUNTER — Other Ambulatory Visit: Payer: Self-pay | Admitting: Orthopedic Surgery

## 2021-06-15 DIAGNOSIS — N3000 Acute cystitis without hematuria: Secondary | ICD-10-CM

## 2021-06-15 LAB — URINALYSIS
Bilirubin Urine: NEGATIVE
Glucose, UA: NEGATIVE
Hgb urine dipstick: NEGATIVE
Ketones, ur: NEGATIVE
Leukocytes,Ua: NEGATIVE
Nitrite: POSITIVE — AB
Protein, ur: NEGATIVE
Specific Gravity, Urine: 1.023 (ref 1.001–1.035)
pH: 6.5 (ref 5.0–8.0)

## 2021-06-15 LAB — URINE CULTURE
MICRO NUMBER:: 13019062
SPECIMEN QUALITY:: ADEQUATE

## 2021-06-15 MED ORDER — CIPROFLOXACIN HCL 500 MG PO TABS: 500.0000 mg | ORAL_TABLET | Freq: Two times a day (BID) | ORAL | 0 refills | Status: AC

## 2021-06-16 ENCOUNTER — Telehealth: Payer: Self-pay | Admitting: *Deleted

## 2021-06-16 NOTE — Telephone Encounter (Signed)
Roselyn Reef with [facility] called and stated that she received an order for Antibiotics for patient for UTI. Requesting OV notes and Labs to be faxed to her for supporting documentation.  Request faxed to Fax: [phone]

## 2021-06-20 ENCOUNTER — Other Ambulatory Visit: Payer: Medicare Other

## 2021-06-24 ENCOUNTER — Other Ambulatory Visit: Payer: Medicare Other

## 2021-06-24 ENCOUNTER — Other Ambulatory Visit: Payer: Self-pay

## 2021-06-24 DIAGNOSIS — R748 Abnormal levels of other serum enzymes: Secondary | ICD-10-CM | POA: Diagnosis not present

## 2021-06-24 LAB — HEPATIC FUNCTION PANEL
AG Ratio: 1.5 (calc) (ref 1.0–2.5)
ALT: 8 U/L (ref 6–29)
AST: 13 U/L (ref 10–35)
Albumin: 3.8 g/dL (ref 3.6–5.1)
Alkaline phosphatase (APISO): 70 U/L (ref 37–153)
Bilirubin, Direct: 0.1 mg/dL (ref 0.0–0.2)
Globulin: 2.6 g/dL (calc) (ref 1.9–3.7)
Indirect Bilirubin: 0.4 mg/dL (calc) (ref 0.2–1.2)
Total Bilirubin: 0.5 mg/dL (ref 0.2–1.2)
Total Protein: 6.4 g/dL (ref 6.1–8.1)

## 2021-06-25 ENCOUNTER — Other Ambulatory Visit: Payer: Self-pay | Admitting: *Deleted

## 2021-06-25 DIAGNOSIS — M159 Polyosteoarthritis, unspecified: Secondary | ICD-10-CM

## 2021-06-25 MED ORDER — HYDROCODONE-ACETAMINOPHEN 10-325 MG PO TABS: ORAL_TABLET | ORAL | 0 refills | Status: DC

## 2021-06-25 NOTE — Telephone Encounter (Signed)
Joy Ward with [facility] called and stated that patient needs a refill on her Hydrocodone.  ?Epic LR: 05/29/2021 ?No contract on file ?Pended Rx and sent to [name] for approval.  ?

## 2021-07-07 DIAGNOSIS — G894 Chronic pain syndrome: Secondary | ICD-10-CM | POA: Diagnosis not present

## 2021-07-07 DIAGNOSIS — M5416 Radiculopathy, lumbar region: Secondary | ICD-10-CM | POA: Diagnosis not present

## 2021-07-08 ENCOUNTER — Telehealth: Payer: Self-pay | Admitting: *Deleted

## 2021-07-08 NOTE — Telephone Encounter (Signed)
Roselyn Reef, Med Tech with [facility] called and stated that patient uses a Heating Pad in her room daily.  ? ?Nurse is requesting an order for patient to use the Heating Pad.  ?Need it faxed to Fax: [phone] ? ?Please Advise.  ?

## 2021-07-08 NOTE — Telephone Encounter (Signed)
Using a heating pad is fine. I do not recommend more than 20 minute intervals 3-4 times daily, concerned for burns if she uses for prolonged periods of time. Please fax order. Thanks

## 2021-07-08 NOTE — Telephone Encounter (Signed)
Faxed to [place] at [facility] Fax: [phone] ?

## 2021-07-14 ENCOUNTER — Other Ambulatory Visit: Payer: Self-pay | Admitting: Family Medicine

## 2021-07-14 DIAGNOSIS — E1169 Type 2 diabetes mellitus with other specified complication: Secondary | ICD-10-CM

## 2021-07-15 ENCOUNTER — Other Ambulatory Visit: Payer: Self-pay | Admitting: Orthopedic Surgery

## 2021-07-15 ENCOUNTER — Telehealth: Payer: Self-pay

## 2021-07-15 DIAGNOSIS — K5901 Slow transit constipation: Secondary | ICD-10-CM

## 2021-07-15 NOTE — Telephone Encounter (Signed)
Joy Ward with [facility] called requesting a refill for suppositories and I informed Joy Ward that we do not have suppositories on patients active medication list. ? ?Joy Ward then reviewed the medication list that they have and agreed that it's no longer on her list and she asked that I disregard the call. ? ? ?

## 2021-07-18 DIAGNOSIS — H401130 Primary open-angle glaucoma, bilateral, stage unspecified: Secondary | ICD-10-CM | POA: Diagnosis not present

## 2021-07-18 LAB — HM DIABETES EYE EXAM

## 2021-07-25 ENCOUNTER — Encounter: Payer: Self-pay | Admitting: Podiatry

## 2021-07-25 ENCOUNTER — Ambulatory Visit (INDEPENDENT_AMBULATORY_CARE_PROVIDER_SITE_OTHER): Payer: Medicare Other | Admitting: Podiatry

## 2021-07-25 ENCOUNTER — Encounter: Payer: Self-pay | Admitting: *Deleted

## 2021-07-25 DIAGNOSIS — M79675 Pain in left toe(s): Secondary | ICD-10-CM

## 2021-07-25 DIAGNOSIS — E1165 Type 2 diabetes mellitus with hyperglycemia: Secondary | ICD-10-CM | POA: Diagnosis not present

## 2021-07-25 DIAGNOSIS — B351 Tinea unguium: Secondary | ICD-10-CM | POA: Diagnosis not present

## 2021-07-25 DIAGNOSIS — M79674 Pain in right toe(s): Secondary | ICD-10-CM | POA: Diagnosis not present

## 2021-07-25 NOTE — Progress Notes (Signed)
[facility] [phone] ?

## 2021-07-25 NOTE — Progress Notes (Signed)
This patient returns to my office for at risk foot care.  This patient requires this care by a professional since this patient will be at risk due to having diabetic neuropathy.  This patient is unable to cut nails herself since the patient cannot reach her nails.These nails are painful walking and wearing shoes.  This patient presents for at risk foot care today.  General Appearance  Alert, conversant and in no acute stress.  Vascular  Dorsalis pedis and posterior tibial  pulses are palpable  bilaterally.  Capillary return is within normal limits  bilaterally. Temperature is within normal limits  bilaterally.  Neurologic  Senn-Weinstein monofilament wire test within normal limits  bilaterally. Muscle power within normal limits bilaterally.  Nails Thick disfigured discolored nails with subungual debris  from hallux to fifth toes bilaterally. No evidence of bacterial infection or drainage bilaterally.  Orthopedic  No limitations of motion  feet .  No crepitus or effusions noted.  No bony pathology or digital deformities noted.  Skin  normotropic skin with no porokeratosis noted bilaterally.  No signs of infections or ulcers noted.     Onychomycosis  Pain in right toes  Pain in left toes  Consent was obtained for treatment procedures.   Mechanical debridement of nails 1-5  bilaterally performed with a nail nipper.  Filed with dremel without incident.     Return office visit   3 months                   Told patient to return for periodic foot care and evaluation due to potential at risk complications.   Narcissa Melder DPM   

## 2021-08-07 ENCOUNTER — Encounter: Payer: Self-pay | Admitting: Orthopedic Surgery

## 2021-08-07 DIAGNOSIS — R410 Disorientation, unspecified: Secondary | ICD-10-CM | POA: Diagnosis not present

## 2021-08-07 DIAGNOSIS — Z9049 Acquired absence of other specified parts of digestive tract: Secondary | ICD-10-CM | POA: Diagnosis not present

## 2021-08-07 DIAGNOSIS — R4182 Altered mental status, unspecified: Secondary | ICD-10-CM | POA: Diagnosis not present

## 2021-08-07 DIAGNOSIS — R7989 Other specified abnormal findings of blood chemistry: Secondary | ICD-10-CM | POA: Diagnosis not present

## 2021-08-07 DIAGNOSIS — R9082 White matter disease, unspecified: Secondary | ICD-10-CM | POA: Diagnosis not present

## 2021-08-07 DIAGNOSIS — R079 Chest pain, unspecified: Secondary | ICD-10-CM | POA: Diagnosis not present

## 2021-08-07 DIAGNOSIS — R748 Abnormal levels of other serum enzymes: Secondary | ICD-10-CM | POA: Diagnosis not present

## 2021-08-07 DIAGNOSIS — F0390 Unspecified dementia without behavioral disturbance: Secondary | ICD-10-CM | POA: Diagnosis not present

## 2021-08-07 DIAGNOSIS — R7401 Elevation of levels of liver transaminase levels: Secondary | ICD-10-CM | POA: Diagnosis not present

## 2021-08-08 ENCOUNTER — Other Ambulatory Visit: Payer: Self-pay | Admitting: Orthopedic Surgery

## 2021-08-08 DIAGNOSIS — R410 Disorientation, unspecified: Secondary | ICD-10-CM | POA: Diagnosis not present

## 2021-08-08 DIAGNOSIS — Z743 Need for continuous supervision: Secondary | ICD-10-CM | POA: Diagnosis not present

## 2021-08-08 DIAGNOSIS — S0990XA Unspecified injury of head, initial encounter: Secondary | ICD-10-CM | POA: Diagnosis not present

## 2021-08-08 DIAGNOSIS — R55 Syncope and collapse: Secondary | ICD-10-CM | POA: Diagnosis not present

## 2021-08-08 NOTE — Telephone Encounter (Signed)
Faxed discontinue order to [facility].  ? ?[facility] fax [phone] ?

## 2021-08-13 ENCOUNTER — Other Ambulatory Visit: Payer: Self-pay | Admitting: Orthopedic Surgery

## 2021-08-13 DIAGNOSIS — F5101 Primary insomnia: Secondary | ICD-10-CM

## 2021-08-13 DIAGNOSIS — E039 Hypothyroidism, unspecified: Secondary | ICD-10-CM

## 2021-08-19 ENCOUNTER — Telehealth: Payer: Self-pay

## 2021-08-19 NOTE — Telephone Encounter (Signed)
Called Joy Ward to refax forms. Held the line for 36 mins, will try again later. ?

## 2021-08-19 NOTE — Telephone Encounter (Signed)
Cindy with the [facility] states she is waiting for forms to be returned. ? ?She also states therapy has been coming to their building but need orders for therapy.  ? ?Jenny Reichmann [phone]) ?

## 2021-08-19 NOTE — Telephone Encounter (Signed)
Im not sure what forms she is referring to.  ?

## 2021-08-20 ENCOUNTER — Telehealth: Payer: Self-pay

## 2021-08-20 DIAGNOSIS — M159 Polyosteoarthritis, unspecified: Secondary | ICD-10-CM

## 2021-08-20 NOTE — Telephone Encounter (Signed)
I left a detailed message on voicemail for [facility] nurse with [name]'s response and advise for them to return call if they have any other needs that we can assist with.  ?

## 2021-08-20 NOTE — Telephone Encounter (Signed)
Message left on clinical intake voicemail from [place] with [facility] requesting a refill for Hydrocodone. ? ?RX last refilled on 06/25/2021 #60 with no refills ? ?No treatment agreement on file and no pending appointment.  ? ?I viewed last routine visit in November and it was not indicated when patient needs to follow-up. I called patients daughter [name] and left a message requesting that she call to scheduled a routine visit within the next 1-2 months (at that time we can update treatment agreement) ?

## 2021-08-20 NOTE — Telephone Encounter (Signed)
She has been seeing Dr. Nelva Bush at [facility] for chronic pain since 03/13. He increased her hydrocodone to TID. They need to contact him for refill.

## 2021-08-21 NOTE — Telephone Encounter (Signed)
[employer]. Spoke with Asencion Partridge who states she refaxed FL2 forms again this morning and hasn't received them back yet.  ? ?Located forms. Faxed all 20 pages back to [phone] ?

## 2021-08-27 ENCOUNTER — Encounter: Payer: Self-pay | Admitting: Orthopedic Surgery

## 2021-08-28 ENCOUNTER — Encounter: Payer: Self-pay | Admitting: Orthopedic Surgery

## 2021-08-28 ENCOUNTER — Ambulatory Visit (INDEPENDENT_AMBULATORY_CARE_PROVIDER_SITE_OTHER): Payer: Medicare Other | Admitting: Orthopedic Surgery

## 2021-08-28 VITALS — BP 142/86 | HR 70 | Temp 97.9°F | Ht 64.0 in

## 2021-08-28 DIAGNOSIS — F01518 Vascular dementia, unspecified severity, with other behavioral disturbance: Secondary | ICD-10-CM | POA: Diagnosis not present

## 2021-08-28 DIAGNOSIS — M545 Low back pain, unspecified: Secondary | ICD-10-CM

## 2021-08-28 DIAGNOSIS — E039 Hypothyroidism, unspecified: Secondary | ICD-10-CM

## 2021-08-28 DIAGNOSIS — G8929 Other chronic pain: Secondary | ICD-10-CM

## 2021-08-28 DIAGNOSIS — K219 Gastro-esophageal reflux disease without esophagitis: Secondary | ICD-10-CM | POA: Diagnosis not present

## 2021-08-28 DIAGNOSIS — J302 Other seasonal allergic rhinitis: Secondary | ICD-10-CM | POA: Diagnosis not present

## 2021-08-28 DIAGNOSIS — E1169 Type 2 diabetes mellitus with other specified complication: Secondary | ICD-10-CM | POA: Diagnosis not present

## 2021-08-28 MED ORDER — CETIRIZINE HCL 10 MG PO TABS: 10.0000 mg | ORAL_TABLET | Freq: Every day | ORAL | 11 refills | Status: DC

## 2021-08-28 NOTE — Progress Notes (Signed)
? ? ?Careteam: ?Patient Care Team: ?Joy Alanis, NP as PCP - General (Adult Health Nurse Practitioner) ? ?Seen by: Windell Moulding, AGNP-C ? ?PLACE OF SERVICE:  ?[facility]  ?Advanced Directive information ?Does Patient Have a Medical Advance Directive?: No, Would patient like information on creating a medical advance directive?: No - Patient declined ? ?Allergies  ?Allergen Reactions  ? Clindamycin Hcl Other (See Comments)  ?  REACTION: C. difficile colitis  ? Sulfamethoxazole-Trimethoprim Other (See Comments)  ?   Eye redness  ? Zanaflex [Tizanidine Hcl] Anaphylaxis  ? Metronidazole Hives  ? Statins Other (See Comments)  ?  Side Effect: muscle pain ?Has tried Zocor, Crestor--she does not want to take a statin, period  ? Bee Venom Other (See Comments)  ?  Unknown reaction  ? Ace Inhibitors Cough  ? Adhesive [Tape] Itching, Rash and Other (See Comments)  ?  Blistering of skin ?  ? ? ?Chief Complaint  ?Patient presents with  ? Follow-up  ?  Patient presents today for follow up for medication renewal.  ? ? ? ?HPI: Patient is a 81 y.o. female seen today for medical management of chronic conditions.  ? ?Daughter present during encounter.  ? ?Medication list from [facility] reviewed.  ? ?Chronic back pain ongoing. She is now seeing Dr. Nelva Bush for back injections. Back pain has improved. Still taking hydrocodone.  ? ?She was in [place] about a month ago. She fell at her daughters home. She went to the ED for evaluation and was not admitted. Daughter reports lab work was normal. Ambulates on her own. No recent falls since hospitalization last month.  ? ?Does not think Claritin is working. Would like to try Zyrtec.  ? ?No acute health concerns today.  ? ?Daughter is still planning to move back to [place] this summer. She plans to take mother with her.  ? ?Review of Systems:  ?Review of Systems  ?Unable to perform ROS: Dementia  ? ?Past Medical History:  ?Diagnosis Date  ? Allergy   ? Arthritis   ? Chronic back pain   ?  DDD (degenerative disc disease), lumbar   ? Diabetes mellitus without complication ([place])   ? Diabetes type 2, uncontrolled 09/18/2006  ? Annotation: Noninsulin dependent Qualifier: Diagnosis of  By: Amil Amen MD, Benjamine Mola    ? Former tobacco use   ? Hyperlipidemia   ? a. H/o muscle aches with statins per PCP notes.  ? Hypertension   ? Hypothyroidism   ? a. prior thyroid removal.  ? Memory loss or impairment 03/29/2019  ? Stroke [facility])   ? a. Listed in PCP notes: "History of stroke:  History of terrible headache.  Had a CT scan of brain and found "ministroke"  Was removed from ERT subsequently. "  ? ?Past Surgical History:  ?Procedure Laterality Date  ? ABDOMINAL HYSTERECTOMY  1994  ? TAH, not clear if unilateral oophorectomy as well.  ? [place]  ? CHOLECYSTECTOMY  1973  ? open  ? DECOMPRESSIVE LUMBAR LAMINECTOMY LEVEL 4  2005  ? In [place]  ? THYROIDECTOMY  2012  ? multinodular goiter  ? ?Social History: ?  reports that she has quit smoking. She has never used smokeless tobacco. She reports that she does not drink alcohol and does not use drugs. ? ?Family History  ?Problem Relation Age of Onset  ? Heart disease Mother   ?     unclear details "atherosclerosis"  ? Diabetes Mother   ?  Alcohol abuse Sister   ? Diabetes Brother   ? Diabetes Brother   ? Drug abuse Brother   ? Diabetes Daughter   ? Heart disease Son   ?     CHF  ? Alcohol abuse Son   ? ? ?Medications: ?Patient's Medications  ?New Prescriptions  ? No medications on file  ?Previous Medications  ? ACETAMINOPHEN (TYLENOL) 325 MG TABLET    Take 650 mg by mouth in the morning and at bedtime. Every 6 hours as needed for mild pain.  ? ALUMINUM-MAGNESIUM HYDROXIDE-SIMETHICONE (MAALOX) 253-664-40 MG/5ML SUSP    Take 30 mLs by mouth every 6 (six) hours as needed (heartburn/indigestion).  ? AMLODIPINE (NORVASC) 5 MG TABLET    TAKE 1 TABLET BY MOUTH EVERY DAY AFTER LUNCH  ? CALCIUM CARBONATE-VIT D-MIN (CALTRATE 600+D PLUS MINERALS) 600-800  MG-UNIT TABS    TAKE 1 TABLET BY MOUTH EVERY DAY AFTER LUNCH  ? DICLOFENAC SODIUM (VOLTAREN) 1 % GEL    Apply 4 g topically 3 (three) times daily. To lower back, left hand, and fingers  ? DOCUSATE SODIUM (COLACE) 250 MG CAPSULE    Take 1 capsule (250 mg total) by mouth daily as needed for constipation.  ? DONEPEZIL (ARICEPT) 10 MG TABLET    TAKE 1 TABLET BY MOUTH AT BEDTIME  ? FAMOTIDINE (PEPCID) 40 MG TABLET    Take 1 tablet (40 mg total) by mouth daily.  ? LEVOTHYROXINE (SYNTHROID) 100 MCG TABLET    TAKE 1 TABLET BY MOUTH EVERY MORNING  ? LORATADINE (ALLERGY RELIEF) 10 MG TABLET    Take 1 tablet (10 mg total) by mouth daily after lunch.  ? METFORMIN (GLUCOPHAGE) 500 MG TABLET    TAKE 1 TABLET BY MOUTH EVERY DAY AFTER LUNCH  ? NEOMYCIN-BACITRACIN-POLYMYXIN (TRIPLE ANTIBIOTIC) 3.5-3318008582 OINT    Apply 1 application topically as needed (minor skin tears/abrasions).  ? PANTOPRAZOLE (PROTONIX) 40 MG TABLET    TAKE 1 TABLET BY MOUTH EVERY DAY  ? POLYETHYLENE GLYCOL POWDER (GLYCOLAX/MIRALAX) 17 GM/SCOOP POWDER    Take 17 g by mouth every other day.  ? POTASSIUM CHLORIDE SA (KLOR-CON) 20 MEQ TABLET    Take 1 tablet (20 mEq total) by mouth daily after lunch.  ? QUETIAPINE (SEROQUEL) 100 MG TABLET    TAKE 1 TABLET BY MOUTH EVERY DAY AT 5:00PM  ? TRAZODONE (DESYREL) 100 MG TABLET    TAKE 1 TABLET BY MOUTH EVERY DAY AT 5:00PM  ?Modified Medications  ? No medications on file  ?Discontinued Medications  ? HYDROCODONE-ACETAMINOPHEN (NORCO) 10-325 MG TABLET    TAKE 1 TABLET BY MOUTH 2 TIMES DAILY AT 8 am and 8 pm  ? ? ?Physical Exam: ? ?Vitals:  ? 08/28/21 1523  ?BP: (!) 142/86  ?Pulse: 70  ?Temp: 97.9 ?F (36.6 ?C)  ?TempSrc: Temporal  ?SpO2: 97%  ?Height: '5\' 4"'$  (1.626 m)  ? ?Body mass index is 26.09 kg/m?. ?Wt Readings from Last 3 Encounters:  ?06/12/21 152 lb (68.9 kg)  ?02/27/21 150 lb 6.4 oz (68.2 kg)  ?01/03/21 168 lb 14 oz (76.6 kg)  ? ? ?Physical Exam ?Vitals reviewed.  ?Constitutional:   ?   General: She is not in acute  distress. ?HENT:  ?   Head: Normocephalic.  ?Eyes:  ?   General:     ?   Right eye: No discharge.     ?   Left eye: No discharge.  ?Neck:  ?   Vascular: No carotid bruit.  ?Cardiovascular:  ?  Rate and Rhythm: Normal rate and regular rhythm.  ?   Pulses: Normal pulses.  ?   Heart sounds: Normal heart sounds.  ?Pulmonary:  ?   Effort: Pulmonary effort is normal.  ?   Breath sounds: Normal breath sounds.  ?Abdominal:  ?   General: Abdomen is flat. Bowel sounds are normal. There is no distension.  ?   Palpations: Abdomen is soft.  ?   Tenderness: There is no abdominal tenderness.  ?Musculoskeletal:  ?   Cervical back: Neck supple.  ?   Right lower leg: No edema.  ?   Left lower leg: No edema.  ?Lymphadenopathy:  ?   Cervical: No cervical adenopathy.  ?Skin: ?   General: Skin is warm and dry.  ?   Capillary Refill: Capillary refill takes less than 2 seconds.  ?Neurological:  ?   General: No focal deficit present.  ?   Mental Status: She is alert. Mental status is at baseline.  ?   Motor: No weakness.  ?   Gait: Gait normal.  ?Psychiatric:     ?   Mood and Affect: Mood normal.     ?   Behavior: Behavior normal.     ?   Cognition and Memory: Memory is impaired.  ?   Comments: Very pleasant, follows commands, alert to self and person  ? ? ?Labs reviewed: ?Basic Metabolic Panel: ?Recent Labs  ?  12/12/20 ?1554 02/27/21 ?1606 06/12/21 ?1535  ?NA 141 140 141  ?K 3.8 3.8 4.2  ?CL 104 105 106  ?CO2 '28 25 28  '$ ?GLUCOSE 124 161* 142*  ?BUN '10 8 9  '$ ?CREATININE 0.77 0.82 0.85  ?CALCIUM 9.6 9.7 9.4  ?TSH 2.36 0.16* 0.92  ? ?Liver Function Tests: ?Recent Labs  ?  10/20/20 ?1203 12/12/20 ?1554 02/27/21 ?1606 06/12/21 ?1535 06/24/21 ?1520  ?AST 21   < > '15 27 13  '$ ?ALT 34   < > 10 41* 8  ?ALKPHOS 70  --   --   --   --   ?BILITOT 0.4   < > 0.4 0.4 0.5  ?PROT 6.6   < > 6.6 6.5 6.4  ?ALBUMIN 3.9  --   --   --   --   ? < > = values in this interval not displayed.  ? ?No results for input(s): LIPASE, AMYLASE in the last 8760 hours. ?No  results for input(s): AMMONIA in the last 8760 hours. ?CBC: ?Recent Labs  ?  12/12/20 ?1554 02/27/21 ?1606 06/12/21 ?1535  ?WBC 3.8 3.6* 3.4*  ?NEUTROABS 2,162 1,789 1,482*  ?HGB 13.3 12.5 12.7  ?HCT 40.4

## 2021-08-29 ENCOUNTER — Other Ambulatory Visit: Payer: Self-pay | Admitting: Orthopedic Surgery

## 2021-08-29 ENCOUNTER — Telehealth: Payer: Self-pay

## 2021-08-29 DIAGNOSIS — R748 Abnormal levels of other serum enzymes: Secondary | ICD-10-CM

## 2021-08-29 DIAGNOSIS — M545 Low back pain, unspecified: Secondary | ICD-10-CM

## 2021-08-29 LAB — COMPREHENSIVE METABOLIC PANEL
AG Ratio: 1.5 (calc) (ref 1.0–2.5)
ALT: 125 U/L — ABNORMAL HIGH (ref 6–29)
AST: 188 U/L — ABNORMAL HIGH (ref 10–35)
Albumin: 4.1 g/dL (ref 3.6–5.1)
Alkaline phosphatase (APISO): 188 U/L — ABNORMAL HIGH (ref 37–153)
BUN: 14 mg/dL (ref 7–25)
CO2: 28 mmol/L (ref 20–32)
Calcium: 9.8 mg/dL (ref 8.6–10.4)
Chloride: 103 mmol/L (ref 98–110)
Creat: 0.84 mg/dL (ref 0.60–1.00)
Globulin: 2.7 g/dL (calc) (ref 1.9–3.7)
Glucose, Bld: 127 mg/dL (ref 65–139)
Potassium: 4.2 mmol/L (ref 3.5–5.3)
Sodium: 138 mmol/L (ref 135–146)
Total Bilirubin: 0.4 mg/dL (ref 0.2–1.2)
Total Protein: 6.8 g/dL (ref 6.1–8.1)

## 2021-08-29 LAB — CBC WITH DIFFERENTIAL/PLATELET
Absolute Monocytes: 311 cells/uL (ref 200–950)
Basophils Absolute: 32 cells/uL (ref 0–200)
Basophils Relative: 0.7 %
Eosinophils Absolute: 90 cells/uL (ref 15–500)
Eosinophils Relative: 2 %
HCT: 39.8 % (ref 35.0–45.0)
Hemoglobin: 13.1 g/dL (ref 11.7–15.5)
Lymphs Abs: 1449 cells/uL (ref 850–3900)
MCH: 30.3 pg (ref 27.0–33.0)
MCHC: 32.9 g/dL (ref 32.0–36.0)
MCV: 92.1 fL (ref 80.0–100.0)
MPV: 11.4 fL (ref 7.5–12.5)
Monocytes Relative: 6.9 %
Neutro Abs: 2619 cells/uL (ref 1500–7800)
Neutrophils Relative %: 58.2 %
Platelets: 215 10*3/uL (ref 140–400)
RBC: 4.32 10*6/uL (ref 3.80–5.10)
RDW: 12.3 % (ref 11.0–15.0)
Total Lymphocyte: 32.2 %
WBC: 4.5 10*3/uL (ref 3.8–10.8)

## 2021-08-29 LAB — HEMOGLOBIN A1C
Hgb A1c MFr Bld: 6.2 % of total Hgb — ABNORMAL HIGH (ref <5.7)
Mean Plasma Glucose: 131 mg/dL
eAG (mmol/L): 7.3 mmol/L

## 2021-08-29 MED ORDER — TRAMADOL HCL 50 MG PO TABS: 50.0000 mg | ORAL_TABLET | Freq: Two times a day (BID) | ORAL | 0 refills | Status: DC

## 2021-08-29 NOTE — Telephone Encounter (Deleted)
Spoke to patients daughter, [name] and stated that she had reviewed results on MyChart. Daughter made aware of the orders placed to [facility] and scheduled patient for recheck CMP for 5/12.  ?

## 2021-08-29 NOTE — Telephone Encounter (Signed)
Error. Please disregard

## 2021-08-30 ENCOUNTER — Other Ambulatory Visit: Payer: Self-pay | Admitting: Orthopedic Surgery

## 2021-08-30 DIAGNOSIS — K5901 Slow transit constipation: Secondary | ICD-10-CM

## 2021-09-01 ENCOUNTER — Encounter: Payer: Self-pay | Admitting: Orthopedic Surgery

## 2021-09-03 NOTE — Telephone Encounter (Signed)
Messaged routed to Yvonna Alanis, NP.  ?

## 2021-09-04 ENCOUNTER — Other Ambulatory Visit: Payer: Self-pay | Admitting: Orthopedic Surgery

## 2021-09-04 ENCOUNTER — Telehealth: Payer: Self-pay | Admitting: *Deleted

## 2021-09-04 DIAGNOSIS — M5416 Radiculopathy, lumbar region: Secondary | ICD-10-CM | POA: Diagnosis not present

## 2021-09-04 NOTE — Telephone Encounter (Signed)
Asencion Partridge with [facility] [phone] called and stated that there have been some behavior changes with patient.  ?Stated that they are having a hard time getting patient back into the building after activities. Stated that they cannot get her into the car or out of a car at her appointments. Stated that it takes an hour or so to coach her or to calm her down.  ? ?Stated that they are suggesting Memory Care but wants your recommendation.  ?Please Advise.  ?

## 2021-09-04 NOTE — Telephone Encounter (Signed)
Talked with Asencion Partridge at [facility]. Behaviors began when Tramadol started. Cannot take tylenol or hydrocodone due to elevated liver enzymes. Advised nursing staff to discontinue Tramadol, use voltaren gel prn for pain. May send her to ED if behaviors continue. Attempted to call daughter [name], no answer. Will send new orders to [facility] via email : [email or web address]

## 2021-09-04 NOTE — Progress Notes (Signed)
Increased behaviors since starting Tramadol. Talked with Asencion Partridge at [facility]. Advised to stop Tramadol. Will send discontinued orders. May use topical voltaren gel for pain prn.  ?

## 2021-09-04 NOTE — Telephone Encounter (Signed)
Called Asencion Partridge to get Fax # and she had to leave for family emergency and gave me Barbara's number [phone] to get fax number.  ?Called and spoke with Pamala Hurry and she stated to fax to Fax: [phone] ?Faxed.  ?

## 2021-09-05 ENCOUNTER — Other Ambulatory Visit: Payer: Medicare Other

## 2021-09-05 ENCOUNTER — Other Ambulatory Visit: Payer: Self-pay | Admitting: Orthopedic Surgery

## 2021-09-05 ENCOUNTER — Ambulatory Visit
Admission: RE | Admit: 2021-09-05 | Discharge: 2021-09-05 | Disposition: A | Discharge status: Home / Self Care | Payer: Medicare Other | Source: Ambulatory Visit | Attending: Orthopedic Surgery | Admitting: Orthopedic Surgery

## 2021-09-05 ENCOUNTER — Telehealth: Payer: Self-pay | Admitting: *Deleted

## 2021-09-05 DIAGNOSIS — R945 Abnormal results of liver function studies: Secondary | ICD-10-CM | POA: Diagnosis not present

## 2021-09-05 DIAGNOSIS — R748 Abnormal levels of other serum enzymes: Secondary | ICD-10-CM

## 2021-09-05 DIAGNOSIS — Z9049 Acquired absence of other specified parts of digestive tract: Secondary | ICD-10-CM | POA: Diagnosis not present

## 2021-09-05 DIAGNOSIS — G8929 Other chronic pain: Secondary | ICD-10-CM

## 2021-09-05 MED ORDER — LIDOCAINE 5 % EX PTCH: 1.0000 | MEDICATED_PATCH | CUTANEOUS | 1 refills | Status: DC

## 2021-09-05 NOTE — Telephone Encounter (Signed)
Faxed order to [facility], Attn: Carmen/Barbara Fax: [phone] ? ?Per [name]--I have placed new orders for lidocaine patches for Anwitha Clayson, can you fax this order to [facility]?  ?

## 2021-09-06 LAB — COMPREHENSIVE METABOLIC PANEL
AG Ratio: 1.5 (calc) (ref 1.0–2.5)
ALT: 50 U/L — ABNORMAL HIGH (ref 6–29)
AST: 27 U/L (ref 10–35)
Albumin: 4.5 g/dL (ref 3.6–5.1)
Alkaline phosphatase (APISO): 126 U/L (ref 37–153)
BUN: 12 mg/dL (ref 7–25)
CO2: 26 mmol/L (ref 20–32)
Calcium: 10.8 mg/dL — ABNORMAL HIGH (ref 8.6–10.4)
Chloride: 102 mmol/L (ref 98–110)
Creat: 0.64 mg/dL (ref 0.60–1.00)
Globulin: 3.1 g/dL (calc) (ref 1.9–3.7)
Glucose, Bld: 124 mg/dL — ABNORMAL HIGH (ref 65–99)
Potassium: 3.9 mmol/L (ref 3.5–5.3)
Sodium: 140 mmol/L (ref 135–146)
Total Bilirubin: 0.4 mg/dL (ref 0.2–1.2)
Total Protein: 7.6 g/dL (ref 6.1–8.1)

## 2021-09-08 ENCOUNTER — Other Ambulatory Visit: Payer: Self-pay | Admitting: Orthopedic Surgery

## 2021-09-08 ENCOUNTER — Telehealth: Payer: Self-pay | Admitting: *Deleted

## 2021-09-08 DIAGNOSIS — M545 Low back pain, unspecified: Secondary | ICD-10-CM

## 2021-09-08 DIAGNOSIS — K219 Gastro-esophageal reflux disease without esophagitis: Secondary | ICD-10-CM

## 2021-09-08 DIAGNOSIS — R748 Abnormal levels of other serum enzymes: Secondary | ICD-10-CM

## 2021-09-08 MED ORDER — OXYCODONE HCL 5 MG PO TABS: 2.5000 mg | ORAL_TABLET | Freq: Two times a day (BID) | ORAL | 0 refills | Status: DC

## 2021-09-08 NOTE — Telephone Encounter (Signed)
High risk warning populated when attempting to approve refill request. Will send to [place], [name] E, NP to review.   

## 2021-09-08 NOTE — Telephone Encounter (Signed)
Per [name]- Good morning. Can you please send oxycodone orders to [facility] for [employer]. Thank you ? ? ?Order Faxed to [employer] at [facility] Fax: [phone] ?

## 2021-09-09 ENCOUNTER — Telehealth: Payer: Self-pay

## 2021-09-09 ENCOUNTER — Other Ambulatory Visit: Payer: Self-pay | Admitting: Orthopedic Surgery

## 2021-09-09 NOTE — Telephone Encounter (Signed)
Due to elevated liver enzymes, tylenol and Tramadol have been discontinued (discontinues over a week ago). In order to address her chronic back pain, we started tramadol. Unfortunately, Tramadol was giving her increased behaviors per [facility] 09/04/2021 ([place]). I discontinued Tramadol last week and gave her orders for lidocaine patches. At this point, all discontinued orders for tylenol, norco and tramadol have been faxed to [facility] by Rodena Piety. Plan of care also verbally discussed last week with [facility]. Yesterday, her daughter reached out to me with concerns of increased pain. I decided to start her on low dose oxycodone (does not contain tylenol). Script sent to [facility] and new order sent by Rodena Piety. Oxycodone should be the only pain medication besides lidocaine patches at this time.

## 2021-09-09 NOTE — Telephone Encounter (Signed)
Late entry, message left on Clinical intake voicemail yesterday at 4:31 pm ? ?A RX for oxycodone was received from our office and the nurses at [facility] is very concerned as patient is already taking ? ?Hydrocodone, three times daily ?Tramadol, twice daily ?Trazodone, once daily ? ?All of the above rx's were sent by [place] and now we have submitted a rx for oxycodone. Alyse Low was under the impression that patient was seeing a pain management specialist and questions why we are even providing rx's for pain and questions why so many, as they are already having a difficult time keeping the patient awake due to all the controlled substances that are being prescribed. ? ?Side Note: when call is returned we need to speak with Roselyn Reef or Jenny Reichmann ? ?Please advise  ?

## 2021-09-09 NOTE — Telephone Encounter (Signed)
Call returned to [facility], requested to speak with Roselyn Reef or [place] as instructed. Roselyn Reef answered the call and verbalized understanding of [name]'s response and states they have not received any orders to discontinue other medications.  ? ?I spoke with Rodena Piety (medical assistant at our office) and she had faxed the telephone encounter dated 09/04/2021, I confirmed fax number with Roselyn Reef and re-faxed.  ?

## 2021-09-10 ENCOUNTER — Telehealth: Payer: Self-pay | Admitting: *Deleted

## 2021-09-10 DIAGNOSIS — G8929 Other chronic pain: Secondary | ICD-10-CM

## 2021-09-10 NOTE — Telephone Encounter (Signed)
Cindy with [facility] and stated that she needs a Written order with today's date stating to Discontinue the Hydrocodone and to Discontinue the Tylenol.  ?Stated that the Previous message faxed would not work.  ? ?Has to State to Discontinue with current Date.  ? ?Needs faxed to Fax: [phone] ?

## 2021-09-10 NOTE — Telephone Encounter (Signed)
Due to elevated liver enzymes, tylenol and Tramadol have been discontinued (discontinues over a week ago). In order to address her chronic back pain, we started tramadol. Unfortunately, Tramadol was giving her increased behaviors per [facility] 09/04/2021 ([place]). I discontinued Tramadol last week and gave her orders for lidocaine patches. At this point, all discontinued orders for tylenol, norco and tramadol have been faxed to [facility] by Rodena Piety. Plan of care also verbally discussed last week with [facility]. Yesterday, her daughter reached out to me with concerns of increased pain. I decided to start her on low dose oxycodone (does not contain tylenol). Script sent to [facility] and new order sent by Rodena Piety. Oxycodone should be the only pain medication besides lidocaine patches at this time.  ?

## 2021-09-10 NOTE — Telephone Encounter (Signed)
Discussed order clarification with Asencion Partridge at [facility]. They are willing to take a discontinued order from [place]. They need discontinued orders for tylenol, hydrocodone and Tramadol. They also need new orders for oxycodone and Lidocaine patches. Please fax these new orders to [phone].

## 2021-09-10 NOTE — Telephone Encounter (Signed)
Message and orders faxed to [place] ?

## 2021-09-16 ENCOUNTER — Telehealth: Payer: Self-pay | Admitting: *Deleted

## 2021-09-16 NOTE — Telephone Encounter (Signed)
Received fax from [place] for more information to approve Rx for Lidocaine Patch.   Placed form in [name]'s folder to review and fill out.   To be faxed to [phone] once completed.

## 2021-10-06 ENCOUNTER — Encounter: Payer: Self-pay | Admitting: Physician Assistant

## 2021-10-06 ENCOUNTER — Encounter: Payer: Self-pay | Admitting: Orthopedic Surgery

## 2021-10-06 NOTE — Telephone Encounter (Signed)
Received fax from [facility] stating they DENIED coverage for Lidocaine Patch ID: [id number]

## 2021-10-06 NOTE — Telephone Encounter (Signed)
Routed to Yvonna Alanis, NP

## 2021-10-11 ENCOUNTER — Other Ambulatory Visit: Payer: Self-pay | Admitting: Orthopedic Surgery

## 2021-10-11 DIAGNOSIS — G8929 Other chronic pain: Secondary | ICD-10-CM

## 2021-10-12 ENCOUNTER — Encounter (HOSPITAL_BASED_OUTPATIENT_CLINIC_OR_DEPARTMENT_OTHER): Payer: Self-pay | Admitting: *Deleted

## 2021-10-12 ENCOUNTER — Emergency Department (HOSPITAL_BASED_OUTPATIENT_CLINIC_OR_DEPARTMENT_OTHER)
Admission: EM | Admit: 2021-10-12 | Discharge: 2021-10-13 | Disposition: A | Discharge status: Home / Self Care | Payer: Medicare Other | Attending: Emergency Medicine | Admitting: Emergency Medicine

## 2021-10-12 ENCOUNTER — Other Ambulatory Visit: Payer: Self-pay

## 2021-10-12 DIAGNOSIS — E039 Hypothyroidism, unspecified: Secondary | ICD-10-CM | POA: Diagnosis not present

## 2021-10-12 DIAGNOSIS — Z7984 Long term (current) use of oral hypoglycemic drugs: Secondary | ICD-10-CM | POA: Diagnosis not present

## 2021-10-12 DIAGNOSIS — F0390 Unspecified dementia without behavioral disturbance: Secondary | ICD-10-CM | POA: Insufficient documentation

## 2021-10-12 DIAGNOSIS — Z79899 Other long term (current) drug therapy: Secondary | ICD-10-CM | POA: Diagnosis not present

## 2021-10-12 DIAGNOSIS — E119 Type 2 diabetes mellitus without complications: Secondary | ICD-10-CM | POA: Diagnosis not present

## 2021-10-12 DIAGNOSIS — R443 Hallucinations, unspecified: Secondary | ICD-10-CM | POA: Insufficient documentation

## 2021-10-12 DIAGNOSIS — I10 Essential (primary) hypertension: Secondary | ICD-10-CM | POA: Diagnosis not present

## 2021-10-12 NOTE — ED Triage Notes (Signed)
Pt has had some hallucinations per her family and they think she may have a uti. Pt denies any known fevers. Pt not able to answer all questions. States she has "memory loss"

## 2021-10-13 ENCOUNTER — Telehealth: Payer: Self-pay | Admitting: *Deleted

## 2021-10-13 LAB — URINALYSIS, ROUTINE W REFLEX MICROSCOPIC
Bilirubin Urine: NEGATIVE
Glucose, UA: 100 mg/dL — AB
Ketones, ur: NEGATIVE mg/dL
Leukocytes,Ua: NEGATIVE
Nitrite: NEGATIVE
Specific Gravity, Urine: 1.023 (ref 1.005–1.030)
pH: 5.5 (ref 5.0–8.0)

## 2021-10-13 NOTE — Telephone Encounter (Signed)
RX last filled on 09/08/21  No treatment agreement on file and no pending appointment.    RX request will be sent to Yvonna Alanis, NP to review and approve if warranted.  Also, when should patient schedule a follow-up?

## 2021-10-13 NOTE — Telephone Encounter (Signed)
Below is [name]'s reply:   I know the daughter messaged me that she moved her out of assisted living. She had planned [place] with her sometime this summer. Can we call daughter to clarify? If she has not moved, I would like to see her sometime in July.   Thanks  [name]    I called [name], patients daughter and left a detailed message requesting a return call to discuss the above

## 2021-10-13 NOTE — ED Notes (Signed)
Pt and family refused all blood labs. Provider informed.

## 2021-10-13 NOTE — ED Provider Notes (Signed)
[facility] EMERGENCY DEPT Provider Note   CSN: [id number] Arrival date & time: 10/12/21  2139     History  Chief Complaint  Patient presents with   Urinary Frequency    Joy Ward is a 81 y.o. female.  HPI     This is a 81 year old female who reports with her granddaughter with concerns for UTI.  Granddaughter reports that she has been hallucinating at home.  She states that this is often a sign of UTI.  No known fevers.  Patient denies any new physical complaints including urinary frequency, dysuria.  At baseline she is oriented to herself.  When asked if she has any pain, the patient states no.  Level 5 caveat for dementia  Home Medications Prior to Admission medications   Medication Sig Start Date End Date Taking? Authorizing Provider  aluminum-magnesium hydroxide-simethicone (MAALOX) [phone] MG/5ML SUSP Take 30 mLs by mouth every 6 (six) hours as needed (heartburn/indigestion).    [provider]  amLODipine (NORVASC) 5 MG tablet TAKE 1 TABLET BY MOUTH EVERY DAY AFTER LUNCH 02/11/21   Wardell Honour, MD  bimatoprost (LUMIGAN) 0.01 % SOLN 1 drop at bedtime.    [provider]  Calcium Carbonate-Vit D-Min (CALTRATE 600+D PLUS MINERALS) 600-800 MG-UNIT TABS TAKE 1 TABLET BY MOUTH EVERY DAY AFTER LUNCH 05/19/21   Fargo, [name] E, NP  cetirizine (ZYRTEC) 10 MG tablet Take 1 tablet (10 mg total) by mouth daily. 08/28/21   Fargo, [name] E, NP  diclofenac Sodium (VOLTAREN) 1 % GEL Apply 4 g topically 3 (three) times daily. To lower back, left hand, and fingers 05/29/20   Ngetich, Dinah C, NP  docusate sodium (COLACE) 250 MG capsule Take 1 capsule (250 mg total) by mouth daily as needed for constipation. 10/16/20   Hughie Closs, PA-C  donepezil (ARICEPT) 10 MG tablet TAKE 1 TABLET BY MOUTH AT BEDTIME 12/02/20   Wardell Honour, MD  donepezil (ARICEPT) 5 MG tablet TAKE 1 TABLET BY MOUTH EVERY DAY AT 5:00pm 09/08/21   Fargo, [name] E, NP  famotidine  (PEPCID) 40 MG tablet TAKE 1 TABLET BY MOUTH EVERY DAY 09/08/21   Fargo, [name] E, NP  guaiFENesin (ROBAFEN) 100 MG/5ML liquid Take 5 mLs by mouth every 6 (six) hours as needed for cough.    [provider]  levothyroxine (SYNTHROID) 100 MCG tablet TAKE 1 TABLET BY MOUTH EVERY MORNING 08/13/21   Fargo, [name] E, NP  lidocaine (LIDODERM) 5 % Place 1 patch onto the skin daily. Remove & Discard patch within 12 hours or as directed by MD 09/05/21   Yvonna Alanis, NP  loperamide (IMODIUM) 2 MG capsule Take by mouth as needed for diarrhea or loose stools.    [provider]  magnesium hydroxide (MILK OF MAGNESIA) 400 MG/5ML suspension Take by mouth daily as needed for mild constipation.    [provider]  metFORMIN (GLUCOPHAGE) 500 MG tablet TAKE 1 TABLET BY MOUTH EVERY DAY AFTER LUNCH 07/14/21   Fargo, [name] E, NP  Neomycin-Bacitracin-Polymyxin (TRIPLE ANTIBIOTIC) [phone] OINT Apply 1 application topically as needed (minor skin tears/abrasions).    [provider]  oxyCODONE (OXY IR/ROXICODONE) 5 MG immediate release tablet Take 0.5 tablets (2.5 mg total) by mouth 2 (two) times daily. 09/08/21   Fargo, [name] E, NP  pantoprazole (PROTONIX) 40 MG tablet TAKE 1 TABLET BY MOUTH EVERY DAY 06/12/21   Fargo, [name] E, NP  polyethylene glycol powder (GLYCOLAX/MIRALAX) 17 GM/SCOOP powder Mix 17grams with liquid 2  times daily 09/01/21   Fargo, [name] E, NP  potassium chloride SA (KLOR-CON) 20 MEQ tablet Take 1 tablet (20 mEq total) by mouth daily after lunch. 08/29/20   Fargo, [name] E, NP  QUEtiapine (SEROQUEL) 100 MG tablet TAKE 1 TABLET BY MOUTH EVERY DAY AT 5:00PM 08/13/21   Fargo, [name] E, NP  traZODone (DESYREL) 100 MG tablet TAKE 1 TABLET BY MOUTH EVERY DAY AT 5:00PM 08/13/21   Fargo, [name] E, NP      Allergies    Clindamycin hcl, Sulfamethoxazole-trimethoprim, Zanaflex [tizanidine hcl], Metronidazole, Statins, Bee venom, Ace inhibitors, and Adhesive [tape]    Review of Systems   Review of Systems   Unable to perform ROS: Dementia    Physical Exam Updated Vital Signs BP (!) 160/96   Pulse 73   Temp 98.3 F (36.8 C) (Oral)   Resp 18   Ht 1.626 m ('5\' 4"'$ )   Wt 70.8 kg   SpO2 99%   BMI 26.79 kg/m  Physical Exam Vitals and nursing note reviewed.  Constitutional:      Appearance: She is well-developed. She is not ill-appearing.  HENT:     Head: Normocephalic and atraumatic.     Mouth/Throat:     Mouth: Mucous membranes are moist.  Eyes:     Pupils: Pupils are equal, round, and reactive to light.  Cardiovascular:     Rate and Rhythm: Normal rate and regular rhythm.     Heart sounds: Normal heart sounds.  Pulmonary:     Effort: Pulmonary effort is normal. No respiratory distress.     Breath sounds: No wheezing.  Abdominal:     Palpations: Abdomen is soft.     Tenderness: There is no abdominal tenderness. There is no right CVA tenderness or left CVA tenderness.  Musculoskeletal:     Cervical back: Neck supple.  Skin:    General: Skin is warm and dry.  Neurological:     Mental Status: She is alert and oriented to person, place, and time.     Comments: Oriented to herself and place but not time Moves all 4 extremities, follows basic commands, no facial droop, no dysarthria  Psychiatric:        Mood and Affect: Mood normal.     ED Results / Procedures / Treatments   Labs (all labs ordered are listed, but only abnormal results are displayed) Labs Reviewed  URINALYSIS, ROUTINE W REFLEX MICROSCOPIC - Abnormal; Notable for the following components:      Result Value   Glucose, UA 100 (*)    Hgb urine dipstick SMALL (*)    Protein, ur TRACE (*)    All other components within normal limits  URINE CULTURE    EKG None  Radiology No results found.  Procedures Procedures    Medications Ordered in ED Medications - No data to display  ED Course/ Medical Decision Making/ A&P                           Medical Decision Making Amount and/or Complexity of Data  Reviewed Labs: ordered.   This patient presents to the ED for concern of UTI, this involves an extensive number of treatment options, and is a complaint that carries with it a high risk of complications and morbidity.  I considered the following differential and admission for this acute, potentially life threatening condition.  The differential diagnosis includes worsening dementia, UTI, metabolic derangement, other infection, less likely CVA  MDM:    This is a 82 year old female with a history of dementia who presents with hallucination and concern for UTI.  Family states that she normally hallucinates when she gets urinary tract infection.  She otherwise does not complain of any pain.  She is at her baseline orientation.  She is afebrile and vital signs are reassuring.  Her exam is fairly benign.  Urinalysis obtained as well as a urine culture.  Urinalysis is not consistent with UTI.  Urine culture is pending.  I discussed further work-up with lab work including metabolic panel with the family at bedside.  Patient's granddaughter discussed with patient's power of attorney.  At this time family declines further work-up.  She is otherwise at her baseline.  Suspect she may have some progression of dementia.  (Labs, imaging, consults)  Labs: I Ordered, and personally interpreted labs.  The pertinent results include: Urinalysis, urine culture  Imaging Studies ordered: I ordered imaging studies including none I independently visualized and interpreted imaging. I agree with the radiologist interpretation  Additional history obtained from granddaughter.  External records from outside source obtained and reviewed including prior evaluation  Cardiac Monitoring: The patient was maintained on a cardiac monitor.  I personally viewed and interpreted the cardiac monitored which showed an underlying rhythm of: Normal sinus  Reevaluation: After the interventions noted above, I reevaluated the patient and  found that they have :stayed the same  Social Determinants of Health: Dementia  Disposition: Discharge  Co morbidities that complicate the patient evaluation  Past Medical History:  Diagnosis Date   Allergy    Arthritis    Chronic back pain    DDD (degenerative disc disease), lumbar    Diabetes mellitus without complication ([place])    Diabetes type 2, uncontrolled 09/18/2006   Annotation: Noninsulin dependent Qualifier: Diagnosis of  By: Amil Amen MD, Benjamine Mola     Former tobacco use    Hyperlipidemia    a. H/o muscle aches with statins per PCP notes.   Hypertension    Hypothyroidism    a. prior thyroid removal.   Memory loss or impairment 03/29/2019   Stroke [facility])    a. Listed in PCP notes: "History of stroke:  History of terrible headache.  Had a CT scan of brain and found "ministroke"  Was removed from ERT subsequently. "     Medicines No orders of the defined types were placed in this encounter.   I have reviewed the patients home medicines and have made adjustments as needed  Problem List / ED Course: Problem List Items Addressed This Visit   None Visit Diagnoses     Hallucination    -  Primary                   Final Clinical Impression(s) / ED Diagnoses Final diagnoses:  Hallucination    Rx / DC Orders ED Discharge Orders     None         Merryl Hacker, MD 10/13/21 [phone]

## 2021-10-13 NOTE — Telephone Encounter (Signed)
Received fax from [facility], [place] Dept [phone] Requesting [facility] Altern[facility] Program for Diabled Adults (CAP/DA) form to be filled out. Needing a Qualifying Diagnosis. A Medicaid funded alternative for nursing home placement.   Placed from in [name]'s folder to review and sign.  To be faxed back to Fax: [phone] once completed.

## 2021-10-13 NOTE — Discharge Instructions (Signed)
You were seen today with concerns for hallucinations which are consistent with prior UTIs.  Your urinalysis is not consistent with UTI but urine culture is pending.

## 2021-10-14 LAB — URINE CULTURE: Culture: NO GROWTH

## 2021-10-17 ENCOUNTER — Telehealth: Payer: Self-pay

## 2021-10-25 ENCOUNTER — Other Ambulatory Visit: Payer: Self-pay | Admitting: Orthopedic Surgery

## 2021-10-29 ENCOUNTER — Other Ambulatory Visit (INDEPENDENT_AMBULATORY_CARE_PROVIDER_SITE_OTHER): Payer: Medicare Other

## 2021-10-29 ENCOUNTER — Ambulatory Visit (INDEPENDENT_AMBULATORY_CARE_PROVIDER_SITE_OTHER): Payer: Medicare Other | Admitting: Physician Assistant

## 2021-10-29 ENCOUNTER — Encounter: Payer: Self-pay | Admitting: Physician Assistant

## 2021-10-29 VITALS — BP 130/80 | HR 76 | Ht 64.0 in | Wt 158.0 lb

## 2021-10-29 DIAGNOSIS — R748 Abnormal levels of other serum enzymes: Secondary | ICD-10-CM

## 2021-10-29 LAB — COMPREHENSIVE METABOLIC PANEL
ALT: 7 U/L (ref 0–35)
AST: 12 U/L (ref 0–37)
Albumin: 4.3 g/dL (ref 3.5–5.2)
Alkaline Phosphatase: 65 U/L (ref 39–117)
BUN: 16 mg/dL (ref 6–23)
CO2: 24 mEq/L (ref 19–32)
Calcium: 9.9 mg/dL (ref 8.4–10.5)
Chloride: 103 mEq/L (ref 96–112)
Creatinine, Ser: 0.86 mg/dL (ref 0.40–1.20)
GFR: 64 mL/min (ref >60.00)
Glucose, Bld: 170 mg/dL — ABNORMAL HIGH (ref 70–99)
Potassium: 3.8 mEq/L (ref 3.5–5.1)
Sodium: 139 mEq/L (ref 135–145)
Total Bilirubin: 0.3 mg/dL (ref 0.2–1.2)
Total Protein: 7.2 g/dL (ref 6.0–8.3)

## 2021-10-29 NOTE — Progress Notes (Signed)
I agree with the above note, plan 

## 2021-10-29 NOTE — Progress Notes (Signed)
Subjective:    Patient ID: Joy Ward, female    DOB: 03/15/1941, 81 y.o.   MRN: [id number]  HPI  Joy Ward is a pleasant 81 year old African-American female, new to GI today referred by Windell Moulding, NP with elevated LFTs. Patient has history of hypertension, prior CVA, dementia, and is currently residing in an assisted living facility, prior history of C. difficile, diabetes mellitus and hypothyroidism. She had a recent ER visit on 10/12/2021 with confusion/hallucinations.  By notes this has occurred in the past with UTIs but she did not have a UTI at that visit. Labs done per her PCP on 08/28/2021 had revealed a total bilirubin of 0.4/alk phos 188/AST 188/ALT 125  LFTs had been normal in February 2023. Labs were repeated on 09/05/2021 and completely normal with the exception of ALT of 50.  Patient has not been started on any new medications, however her daughter relates that she does have a chronic pain syndrome secondary to chronic back pain and has been taking regular doses of hydrocodone for years.  Last September she had been advised to decrease the amount of hydrocodone and in the interim Tylenol 650 mg was to be substituted.  Her daughter states that she had been taking 2 doses of hydrocodone daily and was to take 2 doses of 650 mg Tylenol.  She thinks over time that the amount of Tylenol that her mom was being given had been increased to 3 or 4 times per day in addition to the hydrocodone and that may have caused the elevated LFTs. Since that initial set of LFTs on 08/28/2021 the hydrocodone has been stopped as has the Tylenol and the LFTs did about normalize.  They are now prescribing OxyContin for her chronic pain. She had upper abdominal ultrasound 09/05/2021 gallbladder absent, CBD 6 mm some increased echogenicity of the liver consistent with fatty liver.  Patient has no GI complaints, is not a great historian, does mention that she has sometimes had some left-sided abdominal discomfort.   Appetite has been fine, no nausea or vomiting.  She does have history of chronic GERD and is maintained on Protonix 40 mg p.o. daily.  Review of Systems Pertinent positive and negative review of systems were noted in the above HPI section.  All other review of systems was otherwise negative.   Outpatient Encounter Medications as of 10/29/2021  Medication Sig   aluminum-magnesium hydroxide-simethicone (MAALOX) 810-175-10 MG/5ML SUSP Take 30 mLs by mouth every 6 (six) hours as needed (heartburn/indigestion).   amLODipine (NORVASC) 5 MG tablet TAKE 1 TABLET BY MOUTH EVERY DAY AFTER LUNCH   Calcium Carbonate-Vit D-Min (CALTRATE 600+D PLUS MINERALS) 600-800 MG-UNIT TABS TAKE 1 TABLET BY MOUTH EVERY DAY AFTER LUNCH   cetirizine (ZYRTEC) 10 MG tablet Take 1 tablet (10 mg total) by mouth daily.   diclofenac Sodium (VOLTAREN) 1 % GEL Apply 4 g topically 3 (three) times daily. To lower back, left hand, and fingers   donepezil (ARICEPT) 5 MG tablet TAKE 1 TABLET BY MOUTH EVERY DAY AT 5:00pm   famotidine (PEPCID) 40 MG tablet TAKE 1 TABLET BY MOUTH EVERY DAY   levothyroxine (SYNTHROID) 100 MCG tablet TAKE 1 TABLET BY MOUTH EVERY MORNING   loperamide (IMODIUM) 2 MG capsule Take by mouth as needed for diarrhea or loose stools.   LUMIGAN 0.01 % SOLN instill 1 drop in [facility] eye AT bedtime TO control intraocular pressure   magnesium hydroxide (MILK OF MAGNESIA) 400 MG/5ML suspension Take by mouth daily as needed for mild constipation.  metFORMIN (GLUCOPHAGE) 500 MG tablet TAKE 1 TABLET BY MOUTH EVERY DAY AFTER LUNCH   oxyCODONE (OXY IR/ROXICODONE) 5 MG immediate release tablet TAKE 1/2 TABLET BY MOUTH 2 TIMES DAILY   pantoprazole (PROTONIX) 40 MG tablet TAKE 1 TABLET BY MOUTH EVERY DAY   polyethylene glycol powder (GLYCOLAX/MIRALAX) 17 GM/SCOOP powder Mix 17grams with liquid 2 times daily   QUEtiapine (SEROQUEL) 100 MG tablet TAKE 1 TABLET BY MOUTH EVERY DAY AT 5:00PM   traZODone (DESYREL) 100 MG tablet TAKE 1  TABLET BY MOUTH EVERY DAY AT 5:00PM   docusate sodium (COLACE) 250 MG capsule Take 1 capsule (250 mg total) by mouth daily as needed for constipation.   potassium chloride SA (KLOR-CON) 20 MEQ tablet Take 1 tablet (20 mEq total) by mouth daily after lunch.   [DISCONTINUED] donepezil (ARICEPT) 10 MG tablet TAKE 1 TABLET BY MOUTH AT BEDTIME   [DISCONTINUED] guaiFENesin (ROBAFEN) 100 MG/5ML liquid Take 5 mLs by mouth every 6 (six) hours as needed for cough.   [DISCONTINUED] lidocaine (LIDODERM) 5 % Place 1 patch onto the skin daily. Remove & Discard patch within 12 hours or as directed by MD   [DISCONTINUED] Neomycin-Bacitracin-Polymyxin (TRIPLE ANTIBIOTIC) [phone] OINT Apply 1 application topically as needed (minor skin tears/abrasions).   No facility-administered encounter medications on file as of 10/29/2021.   Allergies  Allergen Reactions   Clindamycin Hcl Other (See Comments)    REACTION: C. difficile colitis   Sulfamethoxazole-Trimethoprim Other (See Comments)     Eye redness   Zanaflex [Tizanidine Hcl] Anaphylaxis   Metronidazole Hives   Statins Other (See Comments)    Side Effect: muscle pain Has tried Zocor, Crestor--she does not want to take a statin, period   Bee Venom Other (See Comments)    Unknown reaction   Ace Inhibitors Cough   Adhesive [Tape] Itching, Rash and Other (See Comments)    Blistering of skin    Patient Active Problem List   Diagnosis Date Noted   Pain due to onychomycosis of toenails of both feet 01/15/2021   Ataxia 04/07/2020   Weakness generalized 04/07/2020   Weakness 04/07/2020   Carpal tunnel syndrome of left wrist 02/20/2020   Diabetic polyneuropathy associated with type 2 diabetes mellitus ([place]) 02/20/2020   Lower GI bleed 07/31/2019   Syncope 07/30/2019   Pancytopenia ([place]) 07/30/2019   Hyperchloremia 33/35/4562   Metabolic acidosis 56/38/9373   Hypocalcemia 07/30/2019   Palliative care patient 07/03/2019   Hallucinations 42/87/6811    Metabolic encephalopathy 57/26/2035   Altered mental status    Recurrent UTI 04/30/2019   Vascular dementia with behavior disturbance ([place]) 04/14/2019   Primary insomnia 04/14/2019   Hypothyroidism 04/14/2019   Diabetes mellitus ([place]) 04/14/2019   Memory loss or impairment 03/29/2019   Diabetic peripheral neuropathy associated with type 2 diabetes mellitus ([place]) 12/29/2017   Peripheral edema 12/29/2017   Elevated liver enzymes 10/15/2016   Chest pain 59/74/1638   Systolic murmur 45/36/4680   Hypercalcemia 04/22/2016   Hyperlipidemia 04/22/2016   Baker's cyst of knee, right 04/21/2016   HYPOKALEMIA 01/24/2010   CONSTIPATION 12/23/2009   INTERMITTENT VERTIGO 12/23/2009   SINUSITIS, ACUTE 04/25/2009   VAGINITIS, CANDIDAL 02/14/2009   HIP PAIN, RIGHT 02/14/2009   HYPERLIPIDEMIA, MIXED 01/01/2009   BACK PAIN, LUMBAR, WITH RADICULOPATHY 12/25/2008   KNEE PAIN, BILATERAL 12/18/2008   TROCHANTERIC BURSITIS, BILATERAL 12/18/2008   Disturbance in sleep behavior 12/18/2008   ONYCHOMYCOSIS, TOENAILS 10/30/2008   ENTERITIS, CLOSTRIDIUM DIFFICILE 10/15/2006   Diabetes type 2, uncontrolled 09/18/2006  Essential hypertension 09/18/2006   Allergic rhinitis 09/18/2006   GOITER, MULTINODULAR 06/29/2006   STROKE 04/27/2001   Social History   Socioeconomic History   Marital status: Single    Spouse name: Not on file   Number of children: 7   Years of education: 11   Highest education level: Not on file  Occupational History   Occupation: pastor--travels a lot.  Tobacco Use   Smoking status: Former   Smokeless tobacco: Never   Tobacco comments:    No smoking since 81 yo - smoked 10 yrs  Vaping Use   Vaping Use: Never used  Substance and Sexual Activity   Alcohol use: No   Drug use: No   Sexual activity: Not Currently  Other Topics Concern   Not on file  Social History Narrative   Lives alone in [place].   Has travelled a lot with her call to "start churches"   Son and  daughter live here.   Social Determinants of Health   Financial Resource Strain: Not on file  Food Insecurity: Not on file  Transportation Needs: Not on file  Physical Activity: Not on file  Stress: Not on file  Social Connections: Not on file  Intimate Partner Violence: Not on file    Joy Ward's family history includes Alcohol abuse in her sister and son; Diabetes in her brother, brother, daughter, and mother; Drug abuse in her brother; Heart disease in her mother and son.      Objective:    Vitals:   10/29/21 1411  BP: 130/80  Pulse: 76    Physical Exam Well-developed well-nourished elderly African-American female in no acute distress.  Accompanied by her daughter , Weight, 158 BMI 27.1  HEENT; nontraumatic normocephalic, EOMI, PE R LA, sclera anicteric. Oropharynx; examined today Neck; supple, no JVD Cardiovascular; regular rate and rhythm with S1-S2, no murmur rub or gallop Pulmonary; Clear bilaterally Abdomen; soft, nontender, nondistended, no palpable mass or hepatosplenomegaly, bowel sounds are active Rectal; not done today Skin; benign exam, no jaundice rash or appreciable lesions Extremities; no clubbing cyanosis or edema skin warm and dry Neuro/Psych; alert and oriented x4, grossly nonfocal mood and affect appropriate        Assessment & Plan:   #67 81 year old African-American female with dementia and prior CVA, referred with an isolated set of elevated LFTs May 2023 Repeat LFTs 8 days later normalized with the exception of an alk phos of 50 Documented normal LFTs February 2023  Ultrasound 09/05/2021 status postcholecystectomy, CBD 6 mm, some increased echogenicity of the liver consistent with fatty changes  Patient had been on regular doses of hydrocodone and was being given 650 mg Tylenol 3-4 times daily by daughters report. After the set of abnormal LFTs the hydrocodone was discontinued as was the Tylenol and she has now been switched to  OxyContin.  No new medications.  I suspect that the isolated LFT elevation was medication induced-( DILI  )probably Tylenol related, though cannot rule out other.  #2 diabetes mellitus #3.  Hypothyroidism #4.  Status postcholecystectomy #5.  Prior history of C. difficile colitis #6 chronic GERD  Plan; repeat LFTs today If LFTs have normalized I do not think she needs any further GI work-up at this time, would asked that her PCP repeat LFTs in about 3 months. If she has persistent elevation of LFTs, will need further laboratory evaluation hepatitis serologies etc.  She should stay off of acetaminophen. Patient will be established with Dr. Ardis Hughs.  Joy Ward S Joy Lawhorne  PA-C 10/29/2021   Cc: Yvonna Alanis, NP

## 2021-10-29 NOTE — Patient Instructions (Addendum)
If you are age 80 or older, your body mass index should be between 23-30. Your Body mass index is 27.12 kg/m. If this is out of the aforementioned range listed, please consider follow up with your Primary Care Provider. ________________________________________________________  The [facility] providers would like to encourage you to use [facility] to communicate with providers for non-urgent requests or questions.  Due to long hold times on the telephone, sending your provider a message by [facility] may be a faster and more efficient way to get a response.  Please allow 48 business hours for a response.  Please remember that this is for non-urgent requests.  _______________________________________________________  Your provider has requested that you go to the basement level for lab work before leaving today. Press "B" on the elevator. The lab is located at the first door on the left as you exit the elevator.  Thank you for entrusting me with your care and choosing [facility].  [name] Esterwood, PA-C

## 2021-10-30 ENCOUNTER — Encounter: Payer: Self-pay | Admitting: Orthopedic Surgery

## 2021-10-30 ENCOUNTER — Telehealth: Payer: Self-pay

## 2021-10-30 NOTE — Telephone Encounter (Signed)
She has been off tylenol > 2 months. We have also faxed orders numerous times.

## 2021-10-30 NOTE — Telephone Encounter (Signed)
Parks Ranger with [facility] states the patient seen GI yesterday and she received a piece of paper/order for patient to stay off Tylenol. She received no info on who GI doc is and wants to make sure [name] Cleophas Dunker is in agreeance. Asencion Partridge can be reached on her cell at [phone]

## 2021-10-31 NOTE — Telephone Encounter (Signed)
Called and left message on machine for [facility] with [name]'s response.

## 2021-11-03 ENCOUNTER — Other Ambulatory Visit: Payer: Self-pay | Admitting: Orthopedic Surgery

## 2021-11-03 DIAGNOSIS — Z780 Asymptomatic menopausal state: Secondary | ICD-10-CM

## 2021-11-05 ENCOUNTER — Ambulatory Visit (INDEPENDENT_AMBULATORY_CARE_PROVIDER_SITE_OTHER): Payer: Medicare Other | Admitting: Podiatry

## 2021-11-05 ENCOUNTER — Encounter: Payer: Self-pay | Admitting: Podiatry

## 2021-11-05 DIAGNOSIS — E1142 Type 2 diabetes mellitus with diabetic polyneuropathy: Secondary | ICD-10-CM

## 2021-11-05 DIAGNOSIS — E1165 Type 2 diabetes mellitus with hyperglycemia: Secondary | ICD-10-CM

## 2021-11-05 DIAGNOSIS — B351 Tinea unguium: Secondary | ICD-10-CM | POA: Diagnosis not present

## 2021-11-05 DIAGNOSIS — M79674 Pain in right toe(s): Secondary | ICD-10-CM

## 2021-11-05 DIAGNOSIS — M79675 Pain in left toe(s): Secondary | ICD-10-CM

## 2021-11-05 NOTE — Progress Notes (Signed)
This patient returns to my office for at risk foot care.  This patient requires this care by a professional since this patient will be at risk due to having diabetic neuropathy.  This patient is unable to cut nails herself since the patient cannot reach her nails.These nails are painful walking and wearing shoes.  This patient presents for at risk foot care today.  General Appearance  Alert, conversant and in no acute stress.  Vascular  Dorsalis pedis and posterior tibial  pulses are palpable  bilaterally.  Capillary return is within normal limits  bilaterally. Temperature is within normal limits  bilaterally.  Neurologic  Senn-Weinstein monofilament wire test within normal limits  bilaterally. Muscle power within normal limits bilaterally.  Nails Thick disfigured discolored nails with subungual debris  from hallux to fifth toes bilaterally. No evidence of bacterial infection or drainage bilaterally.  Orthopedic  No limitations of motion  feet .  No crepitus or effusions noted.  No bony pathology or digital deformities noted.  Skin  normotropic skin with no porokeratosis noted bilaterally.  No signs of infections or ulcers noted.     Onychomycosis  Pain in right toes  Pain in left toes  Consent was obtained for treatment procedures.   Mechanical debridement of nails 1-5  bilaterally performed with a nail nipper.  Filed with dremel without incident.     Return office visit   3 months                   Told patient to return for periodic foot care and evaluation due to potential at risk complications.   Diego Ulbricht DPM   

## 2021-11-11 ENCOUNTER — Other Ambulatory Visit: Payer: Self-pay | Admitting: Orthopedic Surgery

## 2021-11-11 ENCOUNTER — Other Ambulatory Visit: Payer: Self-pay

## 2021-11-11 ENCOUNTER — Encounter: Payer: Self-pay | Admitting: Orthopedic Surgery

## 2021-11-11 DIAGNOSIS — G894 Chronic pain syndrome: Secondary | ICD-10-CM | POA: Diagnosis not present

## 2021-11-11 DIAGNOSIS — M5136 Other intervertebral disc degeneration, lumbar region: Secondary | ICD-10-CM | POA: Diagnosis not present

## 2021-11-11 DIAGNOSIS — M545 Low back pain, unspecified: Secondary | ICD-10-CM

## 2021-11-11 DIAGNOSIS — M5416 Radiculopathy, lumbar region: Secondary | ICD-10-CM | POA: Diagnosis not present

## 2021-11-11 MED ORDER — OXYCODONE HCL 5 MG PO TABS: ORAL_TABLET | ORAL | 0 refills | Status: DC

## 2021-11-11 NOTE — Telephone Encounter (Signed)
[place] did call and let us know that patient has received prescription for the same medication from different doctors for different doses to clarify confusion for the future documentation of this is being noted 11/11/2021

## 2021-11-12 ENCOUNTER — Telehealth: Payer: Medicare Other | Admitting: *Deleted

## 2021-11-12 ENCOUNTER — Other Ambulatory Visit: Payer: Self-pay | Admitting: Orthopedic Surgery

## 2021-11-12 DIAGNOSIS — M545 Low back pain, unspecified: Secondary | ICD-10-CM

## 2021-11-12 MED ORDER — AMBULATORY NON FORMULARY MEDICATION: 0 refills | Status: DC

## 2021-11-12 NOTE — Telephone Encounter (Signed)
LMOM for daughter to return call. 

## 2021-11-12 NOTE — Telephone Encounter (Signed)
Orders signed. Recommend Cindy discussing sleepiness with daughter. Dr. Nelva Bush ordered oxycodone, they will need to contact him for adjustments. Thanks for your assistance with this.

## 2021-11-12 NOTE — Telephone Encounter (Signed)
Joy Ward called back to check the status of message. I read to [facility] all of the communication that has taken place within this encounter and we concluded that all that is needed is for [name] to provide the D/C order for the heating pad and Oxycodone 2.5 mg daily, I have pended orders for [name] to review.  Joy Ward would like to add that patient sleeps ALL day long and they had to wake her up long enough to administer the high dose oxycodone in the past, and she goes back to sleep afterwards. Patient does not even wake up for meals.

## 2021-11-12 NOTE — Telephone Encounter (Signed)
-----   Message from Yvonna Alanis, NP sent at 11/12/2021  9:23 AM EDT ----- Can you please clarify which oxycodone she prefers? I am prescribing 2.5 mg, Dr. Nelva Bush suggesting 10 mg TID. Will need to call daughter due to dementia.   Thanks  ----- Message ----- From: Joy Reiger A, CMA Sent: 11/12/2021   9:17 AM EDT To: Yvonna Alanis, NP

## 2021-11-12 NOTE — Telephone Encounter (Addendum)
Order was stamped and faxed along with a copy of this telephone encounter   Call placed to [facility] at [facility] to inform her request is complete.

## 2021-11-12 NOTE — Telephone Encounter (Signed)
Spoke with daughter [name] and she stated that she prefers to go with what Dr. Nelva Bush recommended Oxycodone '10mg'$  tid.

## 2021-11-12 NOTE — Telephone Encounter (Signed)
Cindy/Jamie with [facility] called concerning an order they received yesterday for Oxycodone '10mg'$  tid.   Concerns:  1.) Should patient Discontinue the Oxycodone she is currently taking of the 2.'5mg'$  daily or continue  2.) Needing an order Fax to them to DISCONTINUE the Heating Pad because they are not allowed in their community. Needs order faxed to Fax: [phone]   [facility] daughter as [name] requested to Clarify Which Oxycodone she prefers for patient to be taking. Awaiting callback)

## 2021-11-20 ENCOUNTER — Ambulatory Visit: Payer: Medicare Other | Admitting: Orthopedic Surgery

## 2021-11-26 IMAGING — DX DG CHEST 1V PORT
1 series · 1 of 1 positions shown · non-contrast
Comparison: 04/30/2019

CLINICAL DATA: 78-year-old female with pneumonia

EXAM:
PORTABLE CHEST 1 VIEW

[chest ap]
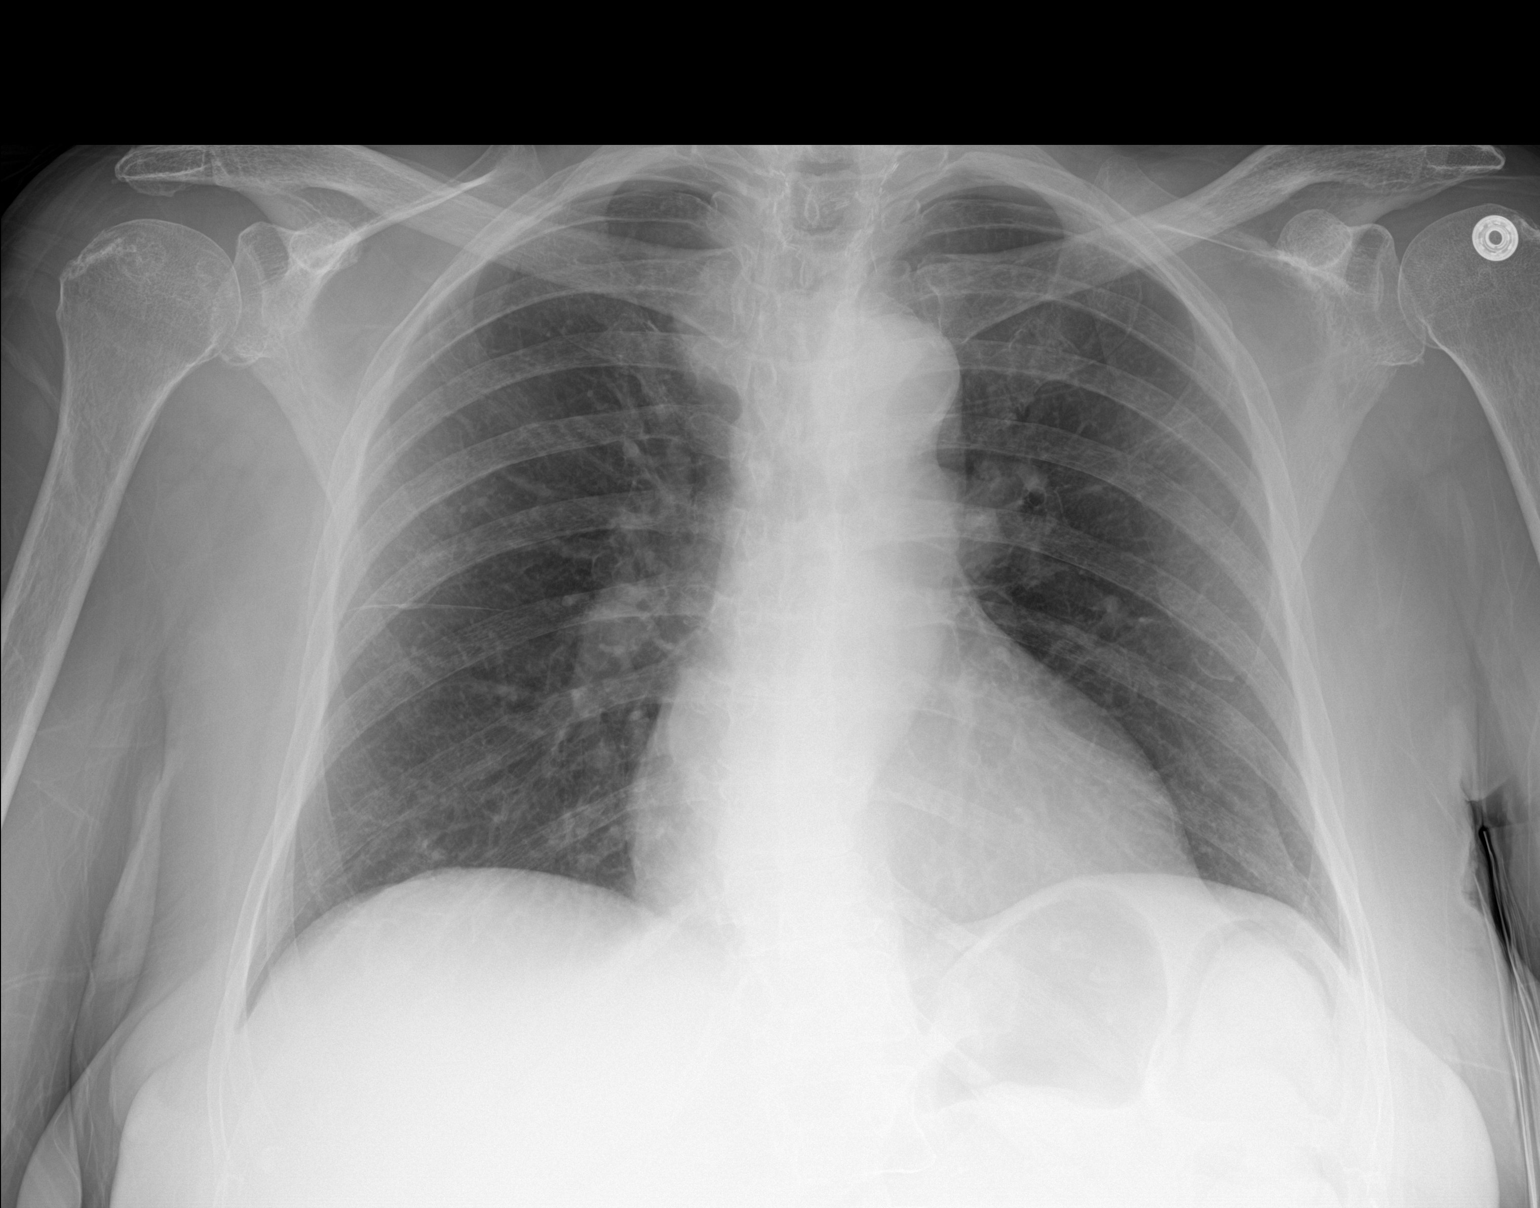

[1 of 1 positions shown; findings below may reference images not displayed]

FINDINGS: Cardiomediastinal silhouette unchanged in size and contour. No
evidence of central vascular congestion. No pneumothorax or pleural
effusion. No confluent airspace disease. Surgical changes of the
right neck base.

Coarsened interstitial markings.  No displaced fracture
IMPRESSION: Chronic lung changes without evidence of acute cardiopulmonary
disease

## 2021-12-11 ENCOUNTER — Ambulatory Visit: Payer: Medicare Other | Admitting: Orthopedic Surgery

## 2021-12-12 ENCOUNTER — Telehealth: Payer: Self-pay | Admitting: *Deleted

## 2021-12-12 NOTE — Telephone Encounter (Signed)
Discussed patient condition with Parks Ranger. Advised her to contact [facility] and discuss oxycodone dosage. Patient was scheduled to see me this past week and cancelled. Recommend rescheduling appointment with [place].

## 2021-12-12 NOTE — Telephone Encounter (Signed)
Parks Ranger with [facility] called with concerns. Stated that she just wanted to let you know that she thinks patient is over Medicated.   Stated that there has been a lot of Change with patient's medications. Patient never gets out of bed and now she is soiling her bed. Incontinent.  Stated that patient use to get up but she has not seen patient out of her room in over a week.   Asencion Partridge is requesting you to call her regarding patient. [phone]

## 2021-12-18 ENCOUNTER — Ambulatory Visit: Payer: Medicare Other | Admitting: Orthopedic Surgery

## 2021-12-24 ENCOUNTER — Emergency Department (HOSPITAL_BASED_OUTPATIENT_CLINIC_OR_DEPARTMENT_OTHER)
Admission: EM | Admit: 2021-12-24 | Discharge: 2021-12-24 | Disposition: A | Discharge status: Home / Self Care | Payer: Medicare Other | Attending: Emergency Medicine | Admitting: Emergency Medicine

## 2021-12-24 ENCOUNTER — Encounter (HOSPITAL_BASED_OUTPATIENT_CLINIC_OR_DEPARTMENT_OTHER): Payer: Self-pay

## 2021-12-24 ENCOUNTER — Other Ambulatory Visit: Payer: Self-pay

## 2021-12-24 DIAGNOSIS — E119 Type 2 diabetes mellitus without complications: Secondary | ICD-10-CM | POA: Insufficient documentation

## 2021-12-24 DIAGNOSIS — N390 Urinary tract infection, site not specified: Secondary | ICD-10-CM | POA: Insufficient documentation

## 2021-12-24 DIAGNOSIS — Z7984 Long term (current) use of oral hypoglycemic drugs: Secondary | ICD-10-CM | POA: Diagnosis not present

## 2021-12-24 DIAGNOSIS — R443 Hallucinations, unspecified: Secondary | ICD-10-CM | POA: Diagnosis not present

## 2021-12-24 DIAGNOSIS — F0390 Unspecified dementia without behavioral disturbance: Secondary | ICD-10-CM | POA: Insufficient documentation

## 2021-12-24 DIAGNOSIS — Z79899 Other long term (current) drug therapy: Secondary | ICD-10-CM | POA: Insufficient documentation

## 2021-12-24 DIAGNOSIS — I10 Essential (primary) hypertension: Secondary | ICD-10-CM | POA: Diagnosis not present

## 2021-12-24 LAB — URINALYSIS, ROUTINE W REFLEX MICROSCOPIC
Bilirubin Urine: NEGATIVE
Glucose, UA: NEGATIVE mg/dL
Ketones, ur: NEGATIVE mg/dL
Nitrite: NEGATIVE
Protein, ur: 30 mg/dL — AB
Specific Gravity, Urine: 1.025 (ref 1.005–1.030)
pH: 6 (ref 5.0–8.0)

## 2021-12-24 MED ORDER — CEFPODOXIME PROXETIL 200 MG PO TABS: 200.0000 mg | ORAL_TABLET | Freq: Two times a day (BID) | ORAL | 0 refills | Status: AC

## 2021-12-24 MED ORDER — CEFPODOXIME PROXETIL 200 MG PO TABS: 200.0000 mg | ORAL_TABLET | Freq: Two times a day (BID) | ORAL | 0 refills | Status: DC

## 2021-12-24 NOTE — Discharge Instructions (Addendum)
Your mother was seen in the emergency department today for hallucinations and not acting appropriately.  As we discussed, her urine did not necessarily look infected we will still go ahead and give her the antibiotics for treatment since she is acting similarly to previous infections.  However, as I have told you it is extremely important she follows up with her primary care physician this week and get reevaluated as there could be a different underlying cause leading to her hallucinations and delirium which could be medication related, dehydration, or multiple other possibilities.  Come back if she has any worsening confusion, uncontrollable nausea and vomiting, high fevers, chest pain, or any other symptoms concerning to you.

## 2021-12-24 NOTE — ED Notes (Signed)
Pt updated x 2 about awaiting MD assessment. Ambulated to the bathroom independently. Denies other needs at this time. Voices concerns about wait times and RN explained that provider was with another pt but should be in to see pt soon.

## 2021-12-24 NOTE — ED Provider Notes (Signed)
[facility] EMERGENCY DEPT Provider Note   CSN: [id number] Arrival date & time: 12/24/21  1550     History {Add pertinent medical, surgical, social history, OB history to HPI:1} Chief Complaint  Patient presents with   Urinary Tract Infection    Joy Ward is a 81 y.o. female. With pmh HTN, HLD, CVA, thyroid disease,DM, dementia who presents with increased hallucinations.   Urinary Tract Infection      Home Medications Prior to Admission medications   Medication Sig Start Date End Date Taking? Authorizing Provider  aluminum-magnesium hydroxide-simethicone (MAALOX) 188-416-60 MG/5ML SUSP Take 30 mLs by mouth every 6 (six) hours as needed (heartburn/indigestion).    [provider]  AMBULATORY NON FORMULARY MEDICATION Discontinue use of heating pad and Oxycodone 2.5 mg daily 11/12/21   Fargo, [name] E, NP  amLODipine (NORVASC) 5 MG tablet TAKE 1 TABLET BY MOUTH EVERY DAY AFTER LUNCH 02/11/21   Wardell Honour, MD  Calcium Carbonate-Vit D-Min (CALCIUM 600+D3 PLUS MINERALS) 600-800 MG-UNIT TABS TAKE 1 TABLET BY MOUTH EVERY DAY AFTER LUNCH 11/03/21   Fargo, [name] E, NP  cetirizine (ZYRTEC) 10 MG tablet Take 1 tablet (10 mg total) by mouth daily. 08/28/21   Fargo, [name] E, NP  diclofenac Sodium (VOLTAREN) 1 % GEL Apply 4 g topically 3 (three) times daily. To lower back, left hand, and fingers 05/29/20   Ngetich, Dinah C, NP  docusate sodium (COLACE) 250 MG capsule Take 1 capsule (250 mg total) by mouth daily as needed for constipation. 10/16/20   Hughie Closs, PA-C  donepezil (ARICEPT) 5 MG tablet TAKE 1 TABLET BY MOUTH EVERY DAY AT 5:00pm 09/08/21   Fargo, [name] E, NP  famotidine (PEPCID) 40 MG tablet TAKE 1 TABLET BY MOUTH EVERY DAY 09/08/21   Fargo, [name] E, NP  levothyroxine (SYNTHROID) 100 MCG tablet TAKE 1 TABLET BY MOUTH EVERY MORNING 08/13/21   Fargo, [name] E, NP  loperamide (IMODIUM) 2 MG capsule Take by mouth as needed for diarrhea or loose stools.    [provider]  LUMIGAN 0.01 % SOLN instill 1 drop in [facility] eye AT bedtime TO control intraocular pressure 10/27/21   Fargo, [name] E, NP  magnesium hydroxide (MILK OF MAGNESIA) 400 MG/5ML suspension Take by mouth daily as needed for mild constipation.    [provider]  metFORMIN (GLUCOPHAGE) 500 MG tablet TAKE 1 TABLET BY MOUTH EVERY DAY AFTER LUNCH 07/14/21   Fargo, [name] E, NP  oxyCODONE (OXY IR/ROXICODONE) 5 MG immediate release tablet Take 10 mg by mouth 3 (three) times daily. Per Dr. Nelva Bush    [provider]  pantoprazole (PROTONIX) 40 MG tablet TAKE 1 TABLET BY MOUTH EVERY DAY 06/12/21   Fargo, [name] E, NP  polyethylene glycol powder (GLYCOLAX/MIRALAX) 17 GM/SCOOP powder Mix 17grams with liquid 2 times daily 09/01/21   Fargo, [name] E, NP  potassium chloride SA (KLOR-CON) 20 MEQ tablet Take 1 tablet (20 mEq total) by mouth daily after lunch. 08/29/20   Fargo, [name] E, NP  QUEtiapine (SEROQUEL) 100 MG tablet TAKE 1 TABLET BY MOUTH EVERY DAY AT 5:00PM 08/13/21   Fargo, [name] E, NP  traZODone (DESYREL) 100 MG tablet TAKE 1 TABLET BY MOUTH EVERY DAY AT 5:00PM 08/13/21   Fargo, [name] E, NP      Allergies    Clindamycin hcl, Sulfamethoxazole-trimethoprim, Zanaflex [tizanidine hcl], Metronidazole, Statins, Bee venom, Ace inhibitors, and Adhesive [tape]    Review of Systems   Review of Systems  Physical Exam Updated  Vital Signs BP (!) 188/94   Pulse 70   Temp 98.8 F (37.1 C)   Resp 18   Ht '5\' 4"'$  (1.626 m)   Wt 71.7 kg   SpO2 100%   BMI 27.13 kg/m  Physical Exam  ED Results / Procedures / Treatments   Labs (all labs ordered are listed, but only abnormal results are displayed) Labs Reviewed  URINALYSIS, ROUTINE W REFLEX MICROSCOPIC - Abnormal; Notable for the following components:      Result Value   Hgb urine dipstick SMALL (*)    Protein, ur 30 (*)    Leukocytes,Ua TRACE (*)    Bacteria, UA RARE (*)    All other components within normal limits    EKG None  Radiology No  results found.  Procedures Procedures  {Document cardiac monitor, telemetry assessment procedure when appropriate:1}  Medications Ordered in ED Medications - No data to display  ED Course/ Medical Decision Making/ A&P                           Medical Decision Making Joy Ward is a 81 y.o. female. With pmh HTN, HLD, CVA, thyroid disease,DM, dementia who presents with increased hallucinations.  Patient's family members endorse that patient has been having polyuria and endorsing dysuria and having increased hallucinations which she has done in the past been having similar to previous UTIs.  Her UA obtained today had small hemoglobin protein urea no nitrites trace leukocyte esterase no RBCs, no WBCs and rare bacteria.  I discussed with the patient's daughter who is one of her healthcare poa that her urine did not necessarily suggest an infection and recommended that we get at least further blood work to further evaluate for dehydration or electrolyte abnormalities contributing to her delirium.  I also noted that there could have been multiple other etiologies such as medication side effects, other infection, constipation, worsening dementia, among multiple other etiologies.  She has no evidence of head injury and is otherwise neurologically intact, no evidence of stroke.  She is afebrile.  Abdomen is benign.  Lungs are clear with no hypoxia.  Although I recommended further work-up with at least labs, daughter declined at this time.  She will follow-up with her primary care physician this week for her mother to get further evaluation and since she has had symptoms very suggestive of UTI with the burning and polyuria, we will go ahead and cover with cefpodoxime. Daughter in agreement with plan. Strict return precautions discussed.  Amount and/or Complexity of Data Reviewed Labs: ordered.  Risk Prescription drug management.   ***  {Document critical care time when appropriate:1} {Document  review of labs and clinical decision tools ie heart score, Chads2Vasc2 etc:1}  {Document your independent review of radiology images, and any outside records:1} {Document your discussion with family members, caretakers, and with consultants:1} {Document social determinants of health affecting pt's care:1} {Document your decision making why or why not admission, treatments were needed:1} Final Clinical Impression(s) / ED Diagnoses Final diagnoses:  None    Rx / DC Orders ED Discharge Orders     None

## 2021-12-24 NOTE — ED Triage Notes (Addendum)
Patient here POV from [facility] with Daughter.  Family Endorses Patient began a New Pain Medication that made Patient have Urinary Frequency and now after approximately 1 Week of Accidental Voiding she began to have some AMS which the Family Member states is Typical of a Probably UTI. Seeking Evaluation for Same.  No Complaints from Patient. No Pain. Some Pain with urination initially.  NAD Noted during Triage. A&Ox4. GCS 15. Ambulatory.

## 2021-12-25 ENCOUNTER — Ambulatory Visit (INDEPENDENT_AMBULATORY_CARE_PROVIDER_SITE_OTHER): Payer: Medicare Other | Admitting: Orthopedic Surgery

## 2021-12-25 ENCOUNTER — Encounter: Payer: Self-pay | Admitting: Orthopedic Surgery

## 2021-12-25 VITALS — BP 130/82 | HR 72 | Temp 95.9°F | Resp 20 | Ht 64.0 in | Wt 161.8 lb

## 2021-12-25 DIAGNOSIS — R300 Dysuria: Secondary | ICD-10-CM | POA: Diagnosis not present

## 2021-12-25 DIAGNOSIS — E1169 Type 2 diabetes mellitus with other specified complication: Secondary | ICD-10-CM | POA: Diagnosis not present

## 2021-12-25 DIAGNOSIS — M545 Low back pain, unspecified: Secondary | ICD-10-CM | POA: Diagnosis not present

## 2021-12-25 DIAGNOSIS — G8929 Other chronic pain: Secondary | ICD-10-CM

## 2021-12-25 DIAGNOSIS — F01518 Vascular dementia, unspecified severity, with other behavioral disturbance: Secondary | ICD-10-CM

## 2021-12-25 DIAGNOSIS — E039 Hypothyroidism, unspecified: Secondary | ICD-10-CM | POA: Diagnosis not present

## 2021-12-25 NOTE — Patient Instructions (Signed)
Encourage hydration with water, avoid caffeine

## 2021-12-25 NOTE — Progress Notes (Signed)
Careteam: Patient Care Team: Yvonna Alanis, NP as PCP - General (Adult Health Nurse Practitioner)  Seen by: Windell Moulding, AGNP-C  PLACE OF SERVICE:  [place]  Advanced Directive information    Allergies  Allergen Reactions   Clindamycin Hcl Other (See Comments)    REACTION: C. difficile colitis   Sulfamethoxazole-Trimethoprim Other (See Comments)     Eye redness   Zanaflex [Tizanidine Hcl] Anaphylaxis   Metronidazole Hives   Statins Other (See Comments)    Side Effect: muscle pain Has tried Zocor, Crestor--she does not want to take a statin, period   Bee Venom Other (See Comments)    Unknown reaction   Ace Inhibitors Cough   Adhesive [Tape] Itching, Rash and Other (See Comments)    Blistering of skin     Chief Complaint  Patient presents with   Medical Management of Chronic Issues    Patient presents today for a follow-up appointment.     HPI: Patient is a 81 y.o. female seen today for acute visit due to recent dysuria.   Family member present today.   ED visit 08/30 due to dysuria and somnolence. UA positive for bacteria, leukocytes, and protein. Urine culture pending. Discharged back to [facility] with cefpodoxine. Denies dysuria today.   Still planning on leaving [facility]. Daughter has brought paperwork to be completed today.   Still having behaviors/hallucinations at times. She is on Aricept and Seroquel.   Followed by Dr. Nelva Bush for chronic back pain. Remains on oxycodone.   No recent falls or injuries. Ambulates without assistive device.   Weights stable.     Review of Systems:  Review of Systems  Unable to perform ROS: Dementia    Past Medical History:  Diagnosis Date   Allergy    Arthritis    Chronic back pain    DDD (degenerative disc disease), lumbar    Diabetes mellitus without complication ([place])    Diabetes type 2, uncontrolled 09/18/2006   Annotation: Noninsulin dependent Qualifier: Diagnosis of  By: Amil Amen MD, Benjamine Mola      Former tobacco use    Hyperlipidemia    a. H/o muscle aches with statins per PCP notes.   Hypertension    Hypothyroidism    a. prior thyroid removal.   Memory loss or impairment 03/29/2019   Stroke [facility])    a. Listed in PCP notes: "History of stroke:  History of terrible headache.  Had a CT scan of brain and found "ministroke"  Was removed from ERT subsequently. "   Past Surgical History:  Procedure Laterality Date   ABDOMINAL HYSTERECTOMY  1994   TAH, not clear if unilateral oophorectomy as well.   [place]   open   [place] LEVEL 4  2005   In [place]  2012   multinodular goiter   Social History:   reports that she has quit smoking. She has never used smokeless tobacco. She reports that she does not drink alcohol and does not use drugs.  Family History  Problem Relation Age of Onset   Heart disease Mother        unclear details "atherosclerosis"   Diabetes Mother    Alcohol abuse Sister    Diabetes Brother    Diabetes Brother    Drug abuse Brother    Diabetes Daughter    Heart disease Son        CHF   Alcohol  abuse Son     Medications: Patient's Medications  New Prescriptions   No medications on file  Previous Medications   ALUMINUM-MAGNESIUM HYDROXIDE-SIMETHICONE (MAALOX) 810-175-10 MG/5ML SUSP    Take 30 mLs by mouth every 6 (six) hours as needed (heartburn/indigestion).   AMBULATORY NON FORMULARY MEDICATION    Discontinue use of heating pad and Oxycodone 2.5 mg daily   AMLODIPINE (NORVASC) 5 MG TABLET    TAKE 1 TABLET BY MOUTH EVERY DAY AFTER LUNCH   CALCIUM CARBONATE-VIT D-MIN (CALCIUM 600+D3 PLUS MINERALS) 600-800 MG-UNIT TABS    TAKE 1 TABLET BY MOUTH EVERY DAY AFTER LUNCH   CEFPODOXIME (VANTIN) 200 MG TABLET    Take 1 tablet (200 mg total) by mouth 2 (two) times daily for 7 days.   CETIRIZINE (ZYRTEC) 10 MG TABLET    Take 1 tablet (10 mg total) by mouth daily.    DICLOFENAC SODIUM (VOLTAREN) 1 % GEL    Apply 4 g topically 3 (three) times daily. To lower back, left hand, and fingers   DOCUSATE SODIUM (COLACE) 250 MG CAPSULE    Take 1 capsule (250 mg total) by mouth daily as needed for constipation.   DONEPEZIL (ARICEPT) 5 MG TABLET    TAKE 1 TABLET BY MOUTH EVERY DAY AT 5:00pm   FAMOTIDINE (PEPCID) 40 MG TABLET    TAKE 1 TABLET BY MOUTH EVERY DAY   LEVOTHYROXINE (SYNTHROID) 100 MCG TABLET    TAKE 1 TABLET BY MOUTH EVERY MORNING   LOPERAMIDE (IMODIUM) 2 MG CAPSULE    Take by mouth as needed for diarrhea or loose stools.   LUMIGAN 0.01 % SOLN    instill 1 drop in [facility] eye AT bedtime TO control intraocular pressure   MAGNESIUM HYDROXIDE (MILK OF MAGNESIA) 400 MG/5ML SUSPENSION    Take by mouth daily as needed for mild constipation.   METFORMIN (GLUCOPHAGE) 500 MG TABLET    TAKE 1 TABLET BY MOUTH EVERY DAY AFTER LUNCH   OXYCODONE (OXY IR/ROXICODONE) 5 MG IMMEDIATE RELEASE TABLET    Take 10 mg by mouth 3 (three) times daily. Per Dr. Nelva Bush   PANTOPRAZOLE (PROTONIX) 40 MG TABLET    TAKE 1 TABLET BY MOUTH EVERY DAY   POLYETHYLENE GLYCOL POWDER (GLYCOLAX/MIRALAX) 17 GM/SCOOP POWDER    Mix 17grams with liquid 2 times daily   POTASSIUM CHLORIDE SA (KLOR-CON) 20 MEQ TABLET    Take 1 tablet (20 mEq total) by mouth daily after lunch.   QUETIAPINE (SEROQUEL) 100 MG TABLET    TAKE 1 TABLET BY MOUTH EVERY DAY AT 5:00PM   TRAZODONE (DESYREL) 100 MG TABLET    TAKE 1 TABLET BY MOUTH EVERY DAY AT 5:00PM  Modified Medications   No medications on file  Discontinued Medications   No medications on file    Physical Exam:  Vitals:   12/25/21 1446  BP: 130/82  Pulse: 72  Resp: 20  Temp: (!) 95.9 F (35.5 C)  SpO2: 98%  Weight: 161 lb 12.8 oz (73.4 kg)  Height: 5' 4"  (1.626 m)   Body mass index is 27.77 kg/m. Wt Readings from Last 3 Encounters:  12/25/21 161 lb 12.8 oz (73.4 kg)  12/24/21 158 lb 1.1 oz (71.7 kg)  10/29/21 158 lb (71.7 kg)    Physical  Exam Vitals reviewed.  Constitutional:      General: She is not in acute distress. HENT:     Head: Normocephalic.  Eyes:     General:        Right  eye: No discharge.        Left eye: No discharge.  Cardiovascular:     Rate and Rhythm: Normal rate and regular rhythm.     Pulses: Normal pulses.     Heart sounds: Murmur heard.  Pulmonary:     Effort: Pulmonary effort is normal. No respiratory distress.     Breath sounds: Normal breath sounds. No wheezing.  Abdominal:     General: Bowel sounds are normal. There is no distension.     Palpations: Abdomen is soft.     Tenderness: There is no abdominal tenderness.  Musculoskeletal:     Cervical back: Neck supple.     Right lower leg: No edema.     Left lower leg: No edema.  Skin:    General: Skin is warm and dry.     Capillary Refill: Capillary refill takes less than 2 seconds.  Neurological:     General: No focal deficit present.     Mental Status: She is alert. Mental status is at baseline.     Motor: No weakness.     Gait: Gait normal.  Psychiatric:        Mood and Affect: Mood normal.        Behavior: Behavior normal.     Comments: Very pleasant, follows commands, alert to self/familiar face     Labs reviewed: Basic Metabolic Panel: Recent Labs    02/27/21 1606 06/12/21 1535 08/28/21 1602 09/05/21 1059 10/29/21 1507  NA 140 141 138 140 139  K 3.8 4.2 4.2 3.9 3.8  CL 105 106 103 102 103  CO2 25 28 28 26 24   GLUCOSE 161* 142* 127 124* 170*  BUN 8 9 14 12 16   CREATININE 0.82 0.85 0.84 0.64 0.86  CALCIUM 9.7 9.4 9.8 10.8* 9.9  TSH 0.16* 0.92  --   --   --    Liver Function Tests: Recent Labs    08/28/21 1602 09/05/21 1059 10/29/21 1507  AST 188* 27 12  ALT 125* 50* 7  ALKPHOS  --   --  65  BILITOT 0.4 0.4 0.3  PROT 6.8 7.6 7.2  ALBUMIN  --   --  4.3   No results for input(s): "LIPASE", "AMYLASE" in the last 8760 hours. No results for input(s): "AMMONIA" in the last 8760 hours. CBC: Recent Labs     02/27/21 1606 06/12/21 1535 08/28/21 1602  WBC 3.6* 3.4* 4.5  NEUTROABS 1,789 1,482* 2,619  HGB 12.5 12.7 13.1  HCT 37.9 38.3 39.8  MCV 89.4 90.1 92.1  PLT 201 210 215   Lipid Panel: No results for input(s): "CHOL", "HDL", "LDLCALC", "TRIG", "CHOLHDL", "LDLDIRECT" in the last 8760 hours. TSH: Recent Labs    02/27/21 1606 06/12/21 1535  TSH 0.16* 0.92   A1C: Lab Results  Component Value Date   HGBA1C 6.2 (H) 08/28/2021     Assessment/Plan 1. Dysuria - 08/30 ED visit - UA positive for bacteria/leukocytes/protein - cont cefpodoxime - encourage hydration with water  2. Vascular dementia with behavior disturbance ([place]) - intermittent behaviors/hallucinations - lives at [place] on own - can complete some tasks - cont Aricept and Seroquel - CBC with Differential/Platelet; Future  3. Chronic bilateral low back pain without sciatica - followed by Dr. Nelva Bush - off norco due to elevated liver enzymes - cont oxycodone  4. Acquired hypothyroidism - cont levothyroxine - TSH; Future  5. Type 2 diabetes mellitus with other specified complication, without long-term current use of insulin (  [place]) - A1c 6.2 08/2021 - cont metformin - CMP with eGFR([facility]); Future - Hemoglobin A1c; Future  Total time: 31 minutes. Greater than 50% of total time spent doing patient education regarding health maintenance, urinary infection, dementia, falls prevention, chronic back pain and T2DM.     Next appt: Visit date not found  [place], [facility] [phone]

## 2021-12-26 LAB — URINE CULTURE: Culture: NO GROWTH

## 2021-12-30 ENCOUNTER — Other Ambulatory Visit: Payer: Self-pay | Admitting: Family Medicine

## 2021-12-30 ENCOUNTER — Other Ambulatory Visit: Payer: Self-pay | Admitting: Orthopedic Surgery

## 2021-12-30 DIAGNOSIS — K219 Gastro-esophageal reflux disease without esophagitis: Secondary | ICD-10-CM

## 2021-12-30 DIAGNOSIS — E039 Hypothyroidism, unspecified: Secondary | ICD-10-CM

## 2021-12-30 DIAGNOSIS — J3089 Other allergic rhinitis: Secondary | ICD-10-CM

## 2022-01-01 ENCOUNTER — Telehealth: Payer: Self-pay

## 2022-01-01 NOTE — Telephone Encounter (Addendum)
Called patient's daughter to let her know that form is complete and up front to pick up. [name], Np also suggested that she have ortho fill out form as well.Form placed for scanning.

## 2022-01-06 ENCOUNTER — Other Ambulatory Visit: Payer: Self-pay | Admitting: Orthopedic Surgery

## 2022-01-26 ENCOUNTER — Other Ambulatory Visit: Payer: Self-pay | Admitting: Orthopedic Surgery

## 2022-01-26 DIAGNOSIS — F5101 Primary insomnia: Secondary | ICD-10-CM

## 2022-01-26 NOTE — Telephone Encounter (Signed)
Pharmacy requested refill.  Pended and sent to [name] for approval.

## 2022-02-10 ENCOUNTER — Encounter: Payer: Self-pay | Admitting: Podiatry

## 2022-02-10 ENCOUNTER — Ambulatory Visit (INDEPENDENT_AMBULATORY_CARE_PROVIDER_SITE_OTHER): Payer: Medicare Other | Admitting: Podiatry

## 2022-02-10 DIAGNOSIS — M79674 Pain in right toe(s): Secondary | ICD-10-CM

## 2022-02-10 DIAGNOSIS — B351 Tinea unguium: Secondary | ICD-10-CM | POA: Diagnosis not present

## 2022-02-10 DIAGNOSIS — E1165 Type 2 diabetes mellitus with hyperglycemia: Secondary | ICD-10-CM | POA: Diagnosis not present

## 2022-02-10 DIAGNOSIS — E1142 Type 2 diabetes mellitus with diabetic polyneuropathy: Secondary | ICD-10-CM

## 2022-02-10 DIAGNOSIS — M79675 Pain in left toe(s): Secondary | ICD-10-CM

## 2022-02-10 NOTE — Progress Notes (Addendum)
This patient returns to my office for at risk foot care.  This patient requires this care by a professional since this patient will be at risk due to having diabetic neuropathy.  This patient is unable to cut nails herself since the patient cannot reach her nails.These nails are painful walking and wearing shoes.  This patient presents for at risk foot care today.  General Appearance  Alert, conversant and in no acute stress.  Vascular  Dorsalis pedis and posterior tibial  pulses are palpable  bilaterally.  Capillary return is within normal limits  bilaterally. Temperature is within normal limits  bilaterally.  Neurologic  Senn-Weinstein monofilament wire test within normal limits  bilaterally. Muscle power within normal limits bilaterally.  Nails Thick disfigured discolored nails with subungual debris  from hallux to fifth toes bilaterally. No evidence of bacterial infection or drainage bilaterally.  Orthopedic  No limitations of motion  feet .  No crepitus or effusions noted.  No bony pathology or digital deformities noted.  Skin  normotropic skin with no porokeratosis noted bilaterally.  No signs of infections or ulcers noted.     Onychomycosis  Pain in right toes  Pain in left toes  Consent was obtained for treatment procedures.   Mechanical debridement of nails 1-5  bilaterally performed with a nail nipper.  Filed with dremel without incident.  Patient had nail polish on her toenails which prevented me from using the dremel tool.   Return office visit   3 months                   Told patient to return for periodic foot care and evaluation due to potential at risk complications.   Gardiner Barefoot DPM

## 2022-02-17 ENCOUNTER — Telehealth: Payer: Self-pay

## 2022-02-17 NOTE — Telephone Encounter (Signed)
error 

## 2022-02-25 DIAGNOSIS — Z23 Encounter for immunization: Secondary | ICD-10-CM | POA: Diagnosis not present

## 2022-03-12 DIAGNOSIS — Z23 Encounter for immunization: Secondary | ICD-10-CM | POA: Diagnosis not present

## 2022-03-18 ENCOUNTER — Other Ambulatory Visit: Payer: Self-pay | Admitting: Orthopedic Surgery

## 2022-05-13 ENCOUNTER — Ambulatory Visit: Payer: Medicare Other | Admitting: Podiatry

## 2022-05-28 ENCOUNTER — Encounter: Payer: Self-pay | Admitting: Orthopedic Surgery

## 2022-05-28 ENCOUNTER — Encounter: Payer: Medicare Other | Admitting: Orthopedic Surgery

## 2022-05-28 ENCOUNTER — Other Ambulatory Visit: Payer: Self-pay | Admitting: Orthopedic Surgery

## 2022-05-28 DIAGNOSIS — K219 Gastro-esophageal reflux disease without esophagitis: Secondary | ICD-10-CM

## 2022-05-28 NOTE — Telephone Encounter (Signed)
Patient has an appointment today at 10:40 with [name]. Forwarded.

## 2022-05-29 ENCOUNTER — Other Ambulatory Visit: Payer: Self-pay | Admitting: Orthopedic Surgery

## 2022-05-29 NOTE — Progress Notes (Signed)
This encounter was created in error - please disregard.

## 2022-06-11 ENCOUNTER — Ambulatory Visit (HOSPITAL_COMMUNITY): Payer: Self-pay

## 2022-06-15 ENCOUNTER — Other Ambulatory Visit: Payer: Self-pay | Admitting: Orthopedic Surgery

## 2022-06-15 DIAGNOSIS — M159 Polyosteoarthritis, unspecified: Secondary | ICD-10-CM

## 2022-06-15 NOTE — Telephone Encounter (Signed)
Medication refilled 2 years ago. Medication pend and sent to PCP Yvonna Alanis, NP for approval.

## 2022-06-17 ENCOUNTER — Encounter: Payer: Self-pay | Admitting: Family Medicine

## 2022-06-17 ENCOUNTER — Ambulatory Visit (INDEPENDENT_AMBULATORY_CARE_PROVIDER_SITE_OTHER): Payer: Medicare Other | Admitting: Family Medicine

## 2022-06-17 VITALS — BP 148/92 | HR 76 | Temp 97.2°F | Ht 64.0 in | Wt 173.6 lb

## 2022-06-17 DIAGNOSIS — E1169 Type 2 diabetes mellitus with other specified complication: Secondary | ICD-10-CM | POA: Diagnosis not present

## 2022-06-17 DIAGNOSIS — E039 Hypothyroidism, unspecified: Secondary | ICD-10-CM | POA: Diagnosis not present

## 2022-06-17 DIAGNOSIS — F01518 Vascular dementia, unspecified severity, with other behavioral disturbance: Secondary | ICD-10-CM

## 2022-06-17 DIAGNOSIS — R350 Frequency of micturition: Secondary | ICD-10-CM

## 2022-06-17 DIAGNOSIS — E1142 Type 2 diabetes mellitus with diabetic polyneuropathy: Secondary | ICD-10-CM

## 2022-06-17 LAB — POCT URINALYSIS DIPSTICK
Bilirubin, UA: NEGATIVE
Glucose, UA: NEGATIVE
Ketones, UA: NEGATIVE
Leukocytes, UA: NEGATIVE
Nitrite, UA: NEGATIVE
Protein, UA: NEGATIVE
Spec Grav, UA: >=1.03 — AB (ref 1.010–1.025)
Urobilinogen, UA: 0.2 E.U./dL
pH, UA: 6 (ref 5.0–8.0)

## 2022-06-17 NOTE — Progress Notes (Signed)
Provider:  Alain Honey, MD  Careteam: Patient Care Team: Yvonna Alanis, NP as PCP - General (Adult Health Nurse Practitioner)  PLACE OF SERVICE:  [place]  Advanced Directive information    Allergies  Allergen Reactions   Clindamycin Hcl Other (See Comments)    REACTION: C. difficile colitis   Sulfamethoxazole-Trimethoprim Other (See Comments)     Eye redness   Zanaflex [Tizanidine Hcl] Anaphylaxis   Metronidazole Hives   Statins Other (See Comments)    Side Effect: muscle pain Has tried Zocor, Crestor--she does not want to take a statin, period   Bee Venom Other (See Comments)    Unknown reaction   Ace Inhibitors Cough   Adhesive [Tape] Itching, Rash and Other (See Comments)    Blistering of skin     Chief Complaint  Patient presents with   Medical Management of Chronic Issues    Patient presents today for a 9 month follow-up   Quality Metric Gaps    A1C,Foot exam, zoster, COVID#6     HPI: Patient is a 82 y.o. female patient is a resident at [facility] which is assisted living.  She has a history of back pain with radiculopathy with previous surgery, type 2 diabetes with peripheral neuropathy, dementia complicated by hallucinations.  She is brought in today by her granddaughter.  They are requesting urinalysis since she has had some behaviors recently. Medications include Aricept amlodipine, Glucophage, oxy IR, Seroquel, and as a real  Review of Systems:  Review of Systems  Constitutional: Negative.   HENT: Negative.    Eyes: Negative.   Respiratory: Negative.    Cardiovascular: Negative.   Musculoskeletal:  Positive for back pain.  Neurological: Negative.   Psychiatric/Behavioral:  Positive for hallucinations and memory loss.   All other systems reviewed and are negative.   Past Medical History:  Diagnosis Date   Allergy    Arthritis    Chronic back pain    DDD (degenerative disc disease), lumbar    Diabetes mellitus without complication  ([place])    Diabetes type 2, uncontrolled 09/18/2006   Annotation: Noninsulin dependent Qualifier: Diagnosis of  By: Amil Amen MD, Benjamine Mola     Former tobacco use    Hyperlipidemia    a. H/o muscle aches with statins per PCP notes.   Hypertension    Hypothyroidism    a. prior thyroid removal.   Memory loss or impairment 03/29/2019   Stroke [facility])    a. Listed in PCP notes: "History of stroke:  History of terrible headache.  Had a CT scan of brain and found "ministroke"  Was removed from ERT subsequently. "   Past Surgical History:  Procedure Laterality Date   ABDOMINAL HYSTERECTOMY  1994   TAH, not clear if unilateral oophorectomy as well.   [place]   open   Sun LEVEL 4  2005   In [place]  2012   multinodular goiter   Social History:   reports that she has quit smoking. She has never used smokeless tobacco. She reports that she does not drink alcohol and does not use drugs.  Family History  Problem Relation Age of Onset   Heart disease Mother        unclear details "atherosclerosis"   Diabetes Mother    Alcohol abuse Sister    Diabetes Brother    Diabetes Brother    Drug abuse Brother  Diabetes Daughter    Heart disease Son        CHF   Alcohol abuse Son     Medications: Patient's Medications  New Prescriptions   No medications on file  Previous Medications   ALUMINUM-MAGNESIUM HYDROXIDE-SIMETHICONE (MAALOX) I037812 MG/5ML SUSP    Take 30 mLs by mouth every 6 (six) hours as needed (heartburn/indigestion).   AMBULATORY NON FORMULARY MEDICATION    Discontinue use of heating pad and Oxycodone 2.5 mg daily   AMLODIPINE (NORVASC) 5 MG TABLET    TAKE 1 TABLET BY MOUTH EVERY DAY AFTER LUNCH   BIMATOPROST (LUMIGAN) 0.01 % SOLN    INSTILL 1 DROP INTO EACH EYE AT BEDTIME TO CONTROL INTRAOCULAR PRESSURE   CALCIUM CARBONATE-VIT D-MIN (CALCIUM 600+D3 PLUS MINERALS) 600-800 MG-UNIT  TABS    TAKE 1 TABLET BY MOUTH EVERY DAY AFTER LUNCH   CETIRIZINE (ZYRTEC) 10 MG TABLET    Take 1 tablet (10 mg total) by mouth daily.   DICLOFENAC SODIUM (VOLTAREN) 1 % GEL    APPLY 2GM TO LOWER BACK 3 TIMES DAILY   DOCUSATE SODIUM (COLACE) 250 MG CAPSULE    Take 1 capsule (250 mg total) by mouth daily as needed for constipation.   DONEPEZIL (ARICEPT) 5 MG TABLET    TAKE 1 TABLET BY MOUTH EVERY DAY AT 5:00pm   FAMOTIDINE (PEPCID) 40 MG TABLET    TAKE 1 TABLET BY MOUTH EVERY DAY   LEVOTHYROXINE (SYNTHROID) 100 MCG TABLET    TAKE 1 TABLET BY MOUTH EVERY MORNING   LOPERAMIDE (IMODIUM) 2 MG CAPSULE    Take by mouth as needed for diarrhea or loose stools.   MAGNESIUM HYDROXIDE (MILK OF MAGNESIA) 400 MG/5ML SUSPENSION    Take by mouth daily as needed for mild constipation.   METFORMIN (GLUCOPHAGE) 500 MG TABLET    TAKE 1 TABLET BY MOUTH EVERY DAY AFTER LUNCH   OXYCODONE (OXY IR/ROXICODONE) 5 MG IMMEDIATE RELEASE TABLET    Take 10 mg by mouth 3 (three) times daily. Per Dr. Nelva Bush   PANTOPRAZOLE (PROTONIX) 40 MG TABLET    TAKE 1 TABLET BY MOUTH EVERY DAY FOR GERD *do not crush*   POLYETHYLENE GLYCOL POWDER (GLYCOLAX/MIRALAX) 17 GM/SCOOP POWDER    Mix 17grams with liquid 2 times daily   POTASSIUM CHLORIDE SA (KLOR-CON) 20 MEQ TABLET    Take 1 tablet (20 mEq total) by mouth daily after lunch.   QUETIAPINE (SEROQUEL) 100 MG TABLET    TAKE 1 TABLET BY MOUTH EVERY DAY AT 5PM   TRAZODONE (DESYREL) 100 MG TABLET    TAKE 1 TABLET BY MOUTH EVERY DAY AT 5PM  Modified Medications   No medications on file  Discontinued Medications   No medications on file    Physical Exam:  Vitals:   06/17/22 1349  BP: (!) 148/92  Pulse: 76  Temp: (!) 97.2 F (36.2 C)  SpO2: 97%  Weight: 173 lb 9.6 oz (78.7 kg)  Height: 5' 4"$  (1.626 m)   Body mass index is 29.8 kg/m. Wt Readings from Last 3 Encounters:  06/17/22 173 lb 9.6 oz (78.7 kg)  12/25/21 161 lb 12.8 oz (73.4 kg)  12/24/21 158 lb 1.1 oz (71.7 kg)     Physical Exam Vitals and nursing note reviewed.  Constitutional:      Appearance: Normal appearance.  Cardiovascular:     Rate and Rhythm: Normal rate and regular rhythm.     Heart sounds: Murmur heard.  Pulmonary:     Effort: Pulmonary  effort is normal.     Breath sounds: Normal breath sounds.  Neurological:     General: No focal deficit present.     Mental Status: She is alert and oriented to person, place, and time.  Psychiatric:        Mood and Affect: Mood normal.        Behavior: Behavior normal.     Labs reviewed: Basic Metabolic Panel: Recent Labs    08/28/21 1602 09/05/21 1059 10/29/21 1507  NA 138 140 139  K 4.2 3.9 3.8  CL 103 102 103  CO2 28 26 24  $ GLUCOSE 127 124* 170*  BUN 14 12 16  $ CREATININE 0.84 0.64 0.86  CALCIUM 9.8 10.8* 9.9   Liver Function Tests: Recent Labs    08/28/21 1602 09/05/21 1059 10/29/21 1507  AST 188* 27 12  ALT 125* 50* 7  ALKPHOS  --   --  65  BILITOT 0.4 0.4 0.3  PROT 6.8 7.6 7.2  ALBUMIN  --   --  4.3   No results for input(s): "LIPASE", "AMYLASE" in the last 8760 hours. No results for input(s): "AMMONIA" in the last 8760 hours. CBC: Recent Labs    08/28/21 1602  WBC 4.5  NEUTROABS 2,619  HGB 13.1  HCT 39.8  MCV 92.1  PLT 215   Lipid Panel: No results for input(s): "CHOL", "HDL", "LDLCALC", "TRIG", "CHOLHDL", "LDLDIRECT" in the last 8760 hours. TSH: No results for input(s): "TSH" in the last 8760 hours. A1C: Lab Results  Component Value Date   HGBA1C 6.2 (H) 08/28/2021     Assessment/Plan  1. Acquired hypothyroidism Continues with thyroid replacement.  Due for TSH today  2. Type 2 diabetes mellitus with other specified complication, without long-term current use of insulin (HCC) Only takes metformin once a day but last A1c was good at 6.2  3. Vascular dementia with behavior disturbance ([place]) Lives in assisted living (rather than memory care).  4. Diabetic polyneuropathy associated with type 2  diabetes mellitus ([place]) Denies foot pain or numbness and is not on medication for neuropathy    Alain Honey, MD [place] [phone]

## 2022-06-17 NOTE — Addendum Note (Signed)
Addended by: Dan Maker on: 06/17/2022 02:35 PM   Modules accepted: Orders

## 2022-06-18 ENCOUNTER — Ambulatory Visit: Payer: Medicare Other | Admitting: Orthopedic Surgery

## 2022-06-18 LAB — URINE CULTURE
MICRO NUMBER:: 14596235
SPECIMEN QUALITY:: ADEQUATE

## 2022-06-18 LAB — TSH: TSH: 6.01 mIU/L — ABNORMAL HIGH (ref 0.40–4.50)

## 2022-06-18 LAB — HEMOGLOBIN A1C
Hgb A1c MFr Bld: 7.8 % of total Hgb — ABNORMAL HIGH (ref <5.7)
Mean Plasma Glucose: 177 mg/dL
eAG (mmol/L): 9.8 mmol/L

## 2022-06-19 ENCOUNTER — Other Ambulatory Visit: Payer: Self-pay | Admitting: Family Medicine

## 2022-06-19 DIAGNOSIS — E039 Hypothyroidism, unspecified: Secondary | ICD-10-CM

## 2022-06-19 MED ORDER — LEVOTHYROXINE SODIUM 112 MCG PO TABS: 112.0000 ug | ORAL_TABLET | Freq: Every day | ORAL | 3 refills | Status: DC

## 2022-06-22 ENCOUNTER — Other Ambulatory Visit: Payer: Self-pay | Admitting: Orthopedic Surgery

## 2022-06-22 DIAGNOSIS — F5101 Primary insomnia: Secondary | ICD-10-CM

## 2022-06-24 ENCOUNTER — Ambulatory Visit (INDEPENDENT_AMBULATORY_CARE_PROVIDER_SITE_OTHER): Payer: Medicare Other | Admitting: Podiatry

## 2022-06-24 ENCOUNTER — Other Ambulatory Visit: Payer: Self-pay | Admitting: Orthopedic Surgery

## 2022-06-24 ENCOUNTER — Encounter: Payer: Self-pay | Admitting: Podiatry

## 2022-06-24 DIAGNOSIS — E119 Type 2 diabetes mellitus without complications: Secondary | ICD-10-CM | POA: Diagnosis not present

## 2022-06-24 DIAGNOSIS — B351 Tinea unguium: Secondary | ICD-10-CM

## 2022-06-24 DIAGNOSIS — M79675 Pain in left toe(s): Secondary | ICD-10-CM

## 2022-06-24 DIAGNOSIS — E1142 Type 2 diabetes mellitus with diabetic polyneuropathy: Secondary | ICD-10-CM

## 2022-06-24 DIAGNOSIS — E1165 Type 2 diabetes mellitus with hyperglycemia: Secondary | ICD-10-CM | POA: Diagnosis not present

## 2022-06-24 DIAGNOSIS — N390 Urinary tract infection, site not specified: Secondary | ICD-10-CM | POA: Diagnosis not present

## 2022-06-24 DIAGNOSIS — M79674 Pain in right toe(s): Secondary | ICD-10-CM

## 2022-06-24 DIAGNOSIS — E039 Hypothyroidism, unspecified: Secondary | ICD-10-CM | POA: Diagnosis not present

## 2022-06-24 LAB — TSH: TSH: 16.8 — AB (ref 0.41–5.90)

## 2022-06-24 NOTE — Progress Notes (Signed)
This patient returns to my office for at risk foot care.  This patient requires this care by a professional since this patient will be at risk due to having diabetic neuropathy.  This patient is unable to cut nails herself since the patient cannot reach her nails.These nails are painful walking and wearing shoes.  This patient presents for at risk foot care today.  General Appearance  Alert, conversant and in no acute stress.  Vascular  Dorsalis pedis and posterior tibial  pulses are palpable  bilaterally.  Capillary return is within normal limits  bilaterally. Temperature is within normal limits  bilaterally.  Neurologic  Senn-Weinstein monofilament wire test within normal limits  bilaterally. Muscle power within normal limits bilaterally.  Nails Thick disfigured discolored nails with subungual debris  from hallux to fifth toes bilaterally. No evidence of bacterial infection or drainage bilaterally.  Orthopedic  No limitations of motion  feet .  No crepitus or effusions noted.  No bony pathology or digital deformities noted.  Skin  normotropic skin with no porokeratosis noted bilaterally.  No signs of infections or ulcers noted.     Onychomycosis  Pain in right toes  Pain in left toes  Consent was obtained for treatment procedures.   Mechanical debridement of nails 1-5  bilaterally performed with a nail nipper.  Filed with dremel without incident.     Return office visit   3 months                   Told patient to return for periodic foot care and evaluation due to potential at risk complications.   Gardiner Barefoot DPM

## 2022-06-24 NOTE — Progress Notes (Signed)
New Rx sent already

## 2022-07-13 ENCOUNTER — Telehealth: Payer: Self-pay

## 2022-07-13 ENCOUNTER — Other Ambulatory Visit: Payer: Self-pay | Admitting: Orthopedic Surgery

## 2022-07-13 ENCOUNTER — Telehealth: Payer: Self-pay | Admitting: *Deleted

## 2022-07-13 DIAGNOSIS — J302 Other seasonal allergic rhinitis: Secondary | ICD-10-CM

## 2022-07-13 NOTE — Telephone Encounter (Signed)
Patient medication has High Risk Warnings.  

## 2022-07-13 NOTE — Telephone Encounter (Signed)
Orders faxed

## 2022-07-13 NOTE — Telephone Encounter (Signed)
Yvonna Alanis, NP  [facility] now (2:04 PM)    Katelan Hirt obtain UA/culture.     Jamie Notified and agreed. Message Faxed to Fax: [phone]

## 2022-07-13 NOTE — Telephone Encounter (Signed)
Joy Ward with [facility] called stating that they never received order for patient to take 112 mcg Levothyroxine. Patient has been taking 100 mcg. She needs order to d/c 100 mcg and to start 112 mcg. Levothyroxine was changed by Dr. Alain Honey 06/19/2022 due to TSH results. Do you approve this change and approve sending order.  Message routed to Joy Moulding, NP

## 2022-07-13 NOTE — Telephone Encounter (Signed)
Roselyn Reef, Nurse with [facility], called and stated that patient is very confused. Stated that she is looking for her baby.   Roselyn Reef is wanting to know if she can have a verbal order to collect U/A and CX on patient due to confusion to rule out UTI.   Please Advise.

## 2022-07-22 DIAGNOSIS — N390 Urinary tract infection, site not specified: Secondary | ICD-10-CM | POA: Diagnosis not present

## 2022-07-23 DIAGNOSIS — N390 Urinary tract infection, site not specified: Secondary | ICD-10-CM | POA: Diagnosis not present

## 2022-07-28 ENCOUNTER — Telehealth: Payer: Self-pay

## 2022-07-28 ENCOUNTER — Other Ambulatory Visit: Payer: Self-pay | Admitting: Orthopedic Surgery

## 2022-07-28 DIAGNOSIS — E1169 Type 2 diabetes mellitus with other specified complication: Secondary | ICD-10-CM

## 2022-07-28 NOTE — Telephone Encounter (Signed)
Yvonna Alanis, NP  Kenyanna Grzesiak, [place], CMA Suspect dementia has progressed. Recommend family schedule appointment to discuss sundowning behaviors.

## 2022-07-28 NOTE — Telephone Encounter (Signed)
I called patient daughter and she states that she's already been seen in office for this. So she's confused why she needs to schedule another appointment for the same concern? Message routed back to PCP Yvonna Alanis, NP

## 2022-07-28 NOTE — Telephone Encounter (Signed)
[facility] called and notified. Nurse Asencion Partridge states that patient has behavior changes from 3pm-4pm. She states that patient has been trying to escape saying she has to find the baby, has to pick up the kids, worries about her grand kids, etc. The nurse is wondering what else can be done since patient doesn't have UTI?

## 2022-07-28 NOTE — Telephone Encounter (Signed)
[place], [name] E, NP  Lucresia Simic E, CMA No sign of UTI. Please reach out to [facility] to update.

## 2022-07-28 NOTE — Telephone Encounter (Signed)
[facility] called and notified. Patient daughter [name] also called and voicemail left for her to contact office due to mothers behaviors and schedule appointment.

## 2022-07-31 NOTE — Telephone Encounter (Signed)
Voicemail left for daughter and MyChart message sent as well.

## 2022-07-31 NOTE — Telephone Encounter (Signed)
Patient daughter called back and scheduled appointment. I placed appointment for the last of the day so she could have a bit more time. Due to daughter having video evidence of what's going on in nursing home that she wanted to share with PCP Octavia Heir, NP . She states that nursing home is not very cooperative with caring for patient or listening to patient needs from daughter.

## 2022-08-24 ENCOUNTER — Other Ambulatory Visit: Payer: Self-pay

## 2022-08-24 ENCOUNTER — Other Ambulatory Visit: Payer: Medicare Other

## 2022-08-24 DIAGNOSIS — E1169 Type 2 diabetes mellitus with other specified complication: Secondary | ICD-10-CM

## 2022-08-24 DIAGNOSIS — F01518 Vascular dementia, unspecified severity, with other behavioral disturbance: Secondary | ICD-10-CM

## 2022-09-03 ENCOUNTER — Encounter (INDEPENDENT_AMBULATORY_CARE_PROVIDER_SITE_OTHER): Payer: Medicare Other | Admitting: Orthopedic Surgery

## 2022-09-03 DIAGNOSIS — Z0000 Encounter for general adult medical examination without abnormal findings: Secondary | ICD-10-CM

## 2022-09-04 NOTE — Progress Notes (Signed)
This encounter was created in error - please disregard.

## 2022-09-17 ENCOUNTER — Other Ambulatory Visit: Payer: Self-pay | Admitting: Orthopedic Surgery

## 2022-09-17 NOTE — Telephone Encounter (Signed)
High Risk Warning Populated when attempting to refill, I will send to Provider for further review 

## 2022-09-22 ENCOUNTER — Ambulatory Visit: Payer: Medicare Other | Admitting: Podiatry

## 2022-09-22 DIAGNOSIS — M545 Low back pain, unspecified: Secondary | ICD-10-CM | POA: Diagnosis not present

## 2022-09-28 ENCOUNTER — Telehealth: Payer: Medicare Other

## 2022-09-28 ENCOUNTER — Other Ambulatory Visit: Payer: Self-pay | Admitting: Orthopedic Surgery

## 2022-09-28 DIAGNOSIS — M25552 Pain in left hip: Secondary | ICD-10-CM

## 2022-09-28 NOTE — Progress Notes (Signed)
Asher Muir with [facility] calls to report left hip pain post fall. She was able to stand after event. Staff placed her in bed after incident. She is laying on right side at this time. Nursing denies deformity or internal.external rotation of left foot/leg. Verbal stat orders for left hip/pelvis given to nursing. Will fax orders to [phone].

## 2022-09-28 NOTE — Telephone Encounter (Signed)
I have spoken with Joy Ward at [facility]. Please send orders for left hip/pelvis to [facility] [phone].

## 2022-09-28 NOTE — Telephone Encounter (Signed)
Asher Muir with [facility] called stating that patient stated she fell and picked herself back up. Patient is now complaining of left hip pain. Patient fell today at 12:24 pm. Asher Muir called just as an FYI, but please advise.  Message sent to Hazle Nordmann, NP

## 2022-09-28 NOTE — Telephone Encounter (Signed)
Order faxed to [facility]

## 2022-09-29 DIAGNOSIS — M25552 Pain in left hip: Secondary | ICD-10-CM | POA: Diagnosis not present

## 2022-09-30 ENCOUNTER — Other Ambulatory Visit: Payer: Self-pay | Admitting: Orthopedic Surgery

## 2022-09-30 DIAGNOSIS — S62512A Displaced fracture of proximal phalanx of left thumb, initial encounter for closed fracture: Secondary | ICD-10-CM | POA: Diagnosis not present

## 2022-09-30 DIAGNOSIS — R319 Hematuria, unspecified: Secondary | ICD-10-CM | POA: Diagnosis not present

## 2022-09-30 DIAGNOSIS — M79645 Pain in left finger(s): Secondary | ICD-10-CM | POA: Diagnosis not present

## 2022-10-07 DIAGNOSIS — Z5181 Encounter for therapeutic drug level monitoring: Secondary | ICD-10-CM | POA: Diagnosis not present

## 2022-10-07 DIAGNOSIS — M79642 Pain in left hand: Secondary | ICD-10-CM | POA: Diagnosis not present

## 2022-10-07 DIAGNOSIS — Z79899 Other long term (current) drug therapy: Secondary | ICD-10-CM | POA: Diagnosis not present

## 2022-10-19 ENCOUNTER — Other Ambulatory Visit: Payer: Self-pay | Admitting: Orthopedic Surgery

## 2022-10-21 ENCOUNTER — Telehealth: Payer: Self-pay

## 2022-10-21 NOTE — Telephone Encounter (Signed)
Patient daughter Bonita Quin called and states that patient is no longer at [facility]. She states that patient had fall at [facility] house and fractured her hand. Daughter is unhappy with services provided from this facility. She states that when they notified her of fall there was no concern. Patient is now staying with daughter at her apartment. However manager of apartment needs a note from PCP Hazle Nordmann E, NP stating that patient has Dementia and can't take care of herself in order to waive her living in residence with daughter. Message routed to PCP Octavia Heir, NP .

## 2022-10-22 ENCOUNTER — Encounter: Payer: Self-pay | Admitting: Orthopedic Surgery

## 2022-10-22 NOTE — Telephone Encounter (Signed)
Letter complete and can be picked up or mailed.

## 2022-11-02 ENCOUNTER — Other Ambulatory Visit: Payer: Self-pay | Admitting: Orthopedic Surgery

## 2022-11-02 DIAGNOSIS — Z780 Asymptomatic menopausal state: Secondary | ICD-10-CM

## 2022-11-02 DIAGNOSIS — F5101 Primary insomnia: Secondary | ICD-10-CM

## 2022-11-04 DIAGNOSIS — M79642 Pain in left hand: Secondary | ICD-10-CM | POA: Diagnosis not present

## 2022-11-05 DIAGNOSIS — R350 Frequency of micturition: Secondary | ICD-10-CM | POA: Diagnosis not present

## 2022-11-05 DIAGNOSIS — N3001 Acute cystitis with hematuria: Secondary | ICD-10-CM | POA: Diagnosis not present

## 2022-11-05 DIAGNOSIS — R051 Acute cough: Secondary | ICD-10-CM | POA: Diagnosis not present

## 2022-11-12 ENCOUNTER — Other Ambulatory Visit: Payer: Self-pay | Admitting: Orthopedic Surgery

## 2022-11-14 ENCOUNTER — Emergency Department (HOSPITAL_COMMUNITY): Payer: Medicare Other

## 2022-11-14 ENCOUNTER — Encounter (HOSPITAL_COMMUNITY): Payer: Self-pay

## 2022-11-14 ENCOUNTER — Other Ambulatory Visit: Payer: Self-pay

## 2022-11-14 ENCOUNTER — Inpatient Hospital Stay (HOSPITAL_COMMUNITY)
Admission: EM | Admit: 2022-11-14 | Discharge: 2022-11-17 | DRG: 309 | Disposition: A | Discharge status: Home Health | Payer: Medicare Other | Attending: Family Medicine | Admitting: Family Medicine

## 2022-11-14 DIAGNOSIS — W19XXXA Unspecified fall, initial encounter: Secondary | ICD-10-CM

## 2022-11-14 DIAGNOSIS — Z87891 Personal history of nicotine dependence: Secondary | ICD-10-CM | POA: Diagnosis not present

## 2022-11-14 DIAGNOSIS — Y92009 Unspecified place in unspecified non-institutional (private) residence as the place of occurrence of the external cause: Secondary | ICD-10-CM

## 2022-11-14 DIAGNOSIS — Z813 Family history of other psychoactive substance abuse and dependence: Secondary | ICD-10-CM

## 2022-11-14 DIAGNOSIS — M549 Dorsalgia, unspecified: Secondary | ICD-10-CM | POA: Diagnosis present

## 2022-11-14 DIAGNOSIS — I441 Atrioventricular block, second degree: Principal | ICD-10-CM | POA: Diagnosis present

## 2022-11-14 DIAGNOSIS — R911 Solitary pulmonary nodule: Secondary | ICD-10-CM | POA: Diagnosis present

## 2022-11-14 DIAGNOSIS — E039 Hypothyroidism, unspecified: Secondary | ICD-10-CM | POA: Diagnosis present

## 2022-11-14 DIAGNOSIS — E119 Type 2 diabetes mellitus without complications: Secondary | ICD-10-CM | POA: Diagnosis present

## 2022-11-14 DIAGNOSIS — I10 Essential (primary) hypertension: Secondary | ICD-10-CM | POA: Diagnosis not present

## 2022-11-14 DIAGNOSIS — Z811 Family history of alcohol abuse and dependence: Secondary | ICD-10-CM

## 2022-11-14 DIAGNOSIS — Z882 Allergy status to sulfonamides status: Secondary | ICD-10-CM | POA: Diagnosis not present

## 2022-11-14 DIAGNOSIS — Z8249 Family history of ischemic heart disease and other diseases of the circulatory system: Secondary | ICD-10-CM | POA: Diagnosis not present

## 2022-11-14 DIAGNOSIS — R531 Weakness: Secondary | ICD-10-CM | POA: Diagnosis present

## 2022-11-14 DIAGNOSIS — K219 Gastro-esophageal reflux disease without esophagitis: Secondary | ICD-10-CM | POA: Diagnosis present

## 2022-11-14 DIAGNOSIS — Z79890 Hormone replacement therapy (postmenopausal): Secondary | ICD-10-CM

## 2022-11-14 DIAGNOSIS — R000 Tachycardia, unspecified: Secondary | ICD-10-CM | POA: Diagnosis not present

## 2022-11-14 DIAGNOSIS — E1169 Type 2 diabetes mellitus with other specified complication: Secondary | ICD-10-CM | POA: Diagnosis not present

## 2022-11-14 DIAGNOSIS — E876 Hypokalemia: Secondary | ICD-10-CM | POA: Diagnosis present

## 2022-11-14 DIAGNOSIS — Z888 Allergy status to other drugs, medicaments and biological substances status: Secondary | ICD-10-CM | POA: Diagnosis not present

## 2022-11-14 DIAGNOSIS — R55 Syncope and collapse: Secondary | ICD-10-CM | POA: Diagnosis present

## 2022-11-14 DIAGNOSIS — Z91030 Bee allergy status: Secondary | ICD-10-CM

## 2022-11-14 DIAGNOSIS — E785 Hyperlipidemia, unspecified: Secondary | ICD-10-CM | POA: Diagnosis present

## 2022-11-14 DIAGNOSIS — Z79899 Other long term (current) drug therapy: Secondary | ICD-10-CM

## 2022-11-14 DIAGNOSIS — G8929 Other chronic pain: Secondary | ICD-10-CM | POA: Diagnosis present

## 2022-11-14 DIAGNOSIS — Z833 Family history of diabetes mellitus: Secondary | ICD-10-CM

## 2022-11-14 DIAGNOSIS — Z8673 Personal history of transient ischemic attack (TIA), and cerebral infarction without residual deficits: Secondary | ICD-10-CM

## 2022-11-14 DIAGNOSIS — Z043 Encounter for examination and observation following other accident: Secondary | ICD-10-CM | POA: Diagnosis not present

## 2022-11-14 DIAGNOSIS — Z881 Allergy status to other antibiotic agents status: Secondary | ICD-10-CM

## 2022-11-14 DIAGNOSIS — Z7984 Long term (current) use of oral hypoglycemic drugs: Secondary | ICD-10-CM | POA: Diagnosis not present

## 2022-11-14 DIAGNOSIS — Z87440 Personal history of urinary (tract) infections: Secondary | ICD-10-CM | POA: Diagnosis not present

## 2022-11-14 DIAGNOSIS — I4891 Unspecified atrial fibrillation: Secondary | ICD-10-CM | POA: Diagnosis not present

## 2022-11-14 DIAGNOSIS — F01518 Vascular dementia, unspecified severity, with other behavioral disturbance: Secondary | ICD-10-CM | POA: Diagnosis present

## 2022-11-14 DIAGNOSIS — M1990 Unspecified osteoarthritis, unspecified site: Secondary | ICD-10-CM | POA: Diagnosis present

## 2022-11-14 DIAGNOSIS — E890 Postprocedural hypothyroidism: Secondary | ICD-10-CM | POA: Diagnosis present

## 2022-11-14 LAB — URINALYSIS, ROUTINE W REFLEX MICROSCOPIC
Bacteria, UA: NONE SEEN
Bilirubin Urine: NEGATIVE
Glucose, UA: NEGATIVE mg/dL
Ketones, ur: 5 mg/dL — AB
Leukocytes,Ua: NEGATIVE
Nitrite: NEGATIVE
Protein, ur: NEGATIVE mg/dL
Specific Gravity, Urine: 1.008 (ref 1.005–1.030)
pH: 5 (ref 5.0–8.0)

## 2022-11-14 LAB — CBC WITH DIFFERENTIAL/PLATELET
Abs Immature Granulocytes: 0.04 10*3/uL (ref 0.00–0.07)
Basophils Absolute: 0 10*3/uL (ref 0.0–0.1)
Basophils Relative: 0 %
Eosinophils Absolute: 0.1 10*3/uL (ref 0.0–0.5)
Eosinophils Relative: 1 %
HCT: 40.2 % (ref 36.0–46.0)
Hemoglobin: 12.9 g/dL (ref 12.0–15.0)
Immature Granulocytes: 1 %
Lymphocytes Relative: 18 %
Lymphs Abs: 1.6 10*3/uL (ref 0.7–4.0)
MCH: 28.2 pg (ref 26.0–34.0)
MCHC: 32.1 g/dL (ref 30.0–36.0)
MCV: 87.8 fL (ref 80.0–100.0)
Monocytes Absolute: 0.7 10*3/uL (ref 0.1–1.0)
Monocytes Relative: 8 %
Neutro Abs: 6.2 10*3/uL (ref 1.7–7.7)
Neutrophils Relative %: 72 %
Platelets: 174 10*3/uL (ref 150–400)
RBC: 4.58 MIL/uL (ref 3.87–5.11)
RDW: 12.2 % (ref 11.5–15.5)
WBC: 8.7 10*3/uL (ref 4.0–10.5)
nRBC: 0 % (ref 0.0–0.2)

## 2022-11-14 LAB — BASIC METABOLIC PANEL
Anion gap: 12 (ref 5–15)
BUN: 9 mg/dL (ref 8–23)
CO2: 23 mmol/L (ref 22–32)
Calcium: 9.5 mg/dL (ref 8.9–10.3)
Chloride: 103 mmol/L (ref 98–111)
Creatinine, Ser: 0.82 mg/dL (ref 0.44–1.00)
GFR, Estimated: >60 mL/min (ref >60)
Glucose, Bld: 179 mg/dL — ABNORMAL HIGH (ref 70–99)
Potassium: 3.1 mmol/L — ABNORMAL LOW (ref 3.5–5.1)
Sodium: 138 mmol/L (ref 135–145)

## 2022-11-14 LAB — TROPONIN I (HIGH SENSITIVITY)
Troponin I (High Sensitivity): 12 ng/L (ref <18)
Troponin I (High Sensitivity): 63 ng/L — ABNORMAL HIGH (ref <18)

## 2022-11-14 LAB — HEMOGLOBIN A1C
Hgb A1c MFr Bld: 7.3 % — ABNORMAL HIGH (ref 4.8–5.6)
Mean Plasma Glucose: 162.81 mg/dL

## 2022-11-14 LAB — GLUCOSE, CAPILLARY
Glucose-Capillary: 146 mg/dL — ABNORMAL HIGH (ref 70–99)
Glucose-Capillary: 236 mg/dL — ABNORMAL HIGH (ref 70–99)
Glucose-Capillary: 225 mg/dL — ABNORMAL HIGH (ref 70–99)

## 2022-11-14 LAB — CBG MONITORING, ED: Glucose-Capillary: 190 mg/dL — ABNORMAL HIGH (ref 70–99)

## 2022-11-14 LAB — TSH: TSH: 0.017 u[IU]/mL — ABNORMAL LOW (ref 0.350–4.500)

## 2022-11-14 LAB — MAGNESIUM: Magnesium: 1.9 mg/dL (ref 1.7–2.4)

## 2022-11-14 LAB — HEPARIN LEVEL (UNFRACTIONATED): Heparin Unfractionated: 0.46 IU/mL (ref 0.30–0.70)

## 2022-11-14 MED ORDER — HEPARIN BOLUS VIA INFUSION
4000.0000 [IU] | Freq: Once | INTRAVENOUS | Status: AC
  Administered 2022-11-14: 4000 [IU] via INTRAVENOUS
  Filled 2022-11-14: qty 4000

## 2022-11-14 MED ORDER — INSULIN ASPART 100 UNIT/ML IJ SOLN
0.0000 [IU] | Freq: Three times a day (TID) | INTRAMUSCULAR | Status: DC
  Administered 2022-11-14: 1 [IU] via SUBCUTANEOUS
  Administered 2022-11-14 – 2022-11-15 (×2): 2 [IU] via SUBCUTANEOUS
  Administered 2022-11-15 (×2): 1 [IU] via SUBCUTANEOUS
  Administered 2022-11-16: 2 [IU] via SUBCUTANEOUS
  Administered 2022-11-16: 1 [IU] via SUBCUTANEOUS
  Administered 2022-11-16: 2 [IU] via SUBCUTANEOUS

## 2022-11-14 MED ORDER — TRAZODONE HCL 100 MG PO TABS
100.0000 mg | ORAL_TABLET | Freq: Every day | ORAL | Status: DC
  Administered 2022-11-14 – 2022-11-16 (×3): 100 mg via ORAL
  Filled 2022-11-14 (×3): qty 1

## 2022-11-14 MED ORDER — SODIUM CHLORIDE 0.9 % IV BOLUS
1000.0000 mL | Freq: Once | INTRAVENOUS | Status: AC
  Administered 2022-11-14: 1000 mL via INTRAVENOUS

## 2022-11-14 MED ORDER — HEPARIN (PORCINE) 25000 UT/250ML-% IV SOLN
1050.0000 [IU]/h | INTRAVENOUS | Status: DC
  Administered 2022-11-14: 1050 [IU]/h via INTRAVENOUS
  Filled 2022-11-14: qty 250

## 2022-11-14 MED ORDER — ACETAMINOPHEN 650 MG RE SUPP: 650.0000 mg | Freq: Four times a day (QID) | RECTAL | Status: DC | PRN

## 2022-11-14 MED ORDER — FAMOTIDINE 20 MG PO TABS
40.0000 mg | ORAL_TABLET | Freq: Every day | ORAL | Status: DC
  Administered 2022-11-14 – 2022-11-17 (×4): 40 mg via ORAL
  Filled 2022-11-14 (×4): qty 2

## 2022-11-14 MED ORDER — POLYETHYLENE GLYCOL 3350 17 G PO PACK
17.0000 g | PACK | Freq: Two times a day (BID) | ORAL | Status: DC
  Administered 2022-11-14 – 2022-11-17 (×7): 17 g via ORAL
  Filled 2022-11-14 (×7): qty 1

## 2022-11-14 MED ORDER — LEVOTHYROXINE SODIUM 112 MCG PO TABS
112.0000 ug | ORAL_TABLET | Freq: Every day | ORAL | Status: DC
  Administered 2022-11-14: 112 ug via ORAL
  Filled 2022-11-14: qty 1

## 2022-11-14 MED ORDER — ALBUTEROL SULFATE (2.5 MG/3ML) 0.083% IN NEBU: 2.5000 mg | INHALATION_SOLUTION | Freq: Four times a day (QID) | RESPIRATORY_TRACT | Status: DC | PRN

## 2022-11-14 MED ORDER — OXYCODONE HCL 5 MG PO TABS
5.0000 mg | ORAL_TABLET | Freq: Once | ORAL | Status: AC
  Administered 2022-11-14: 5 mg via ORAL
  Filled 2022-11-14: qty 1

## 2022-11-14 MED ORDER — ONDANSETRON HCL 4 MG/2ML IJ SOLN: 4.0000 mg | Freq: Four times a day (QID) | INTRAMUSCULAR | Status: DC | PRN

## 2022-11-14 MED ORDER — DONEPEZIL HCL 5 MG PO TABS
5.0000 mg | ORAL_TABLET | Freq: Every day | ORAL | Status: DC
  Administered 2022-11-14 – 2022-11-16 (×3): 5 mg via ORAL
  Filled 2022-11-14 (×3): qty 1

## 2022-11-14 MED ORDER — DILTIAZEM HCL-DEXTROSE 125-5 MG/125ML-% IV SOLN (PREMIX)
5.0000 mg/h | INTRAVENOUS | Status: DC
  Administered 2022-11-14: 5 mg/h via INTRAVENOUS
  Filled 2022-11-14: qty 125

## 2022-11-14 MED ORDER — OXYCODONE HCL 5 MG PO TABS
5.0000 mg | ORAL_TABLET | Freq: Three times a day (TID) | ORAL | Status: DC
  Administered 2022-11-14 – 2022-11-17 (×10): 5 mg via ORAL
  Filled 2022-11-14 (×10): qty 1

## 2022-11-14 MED ORDER — DICLOFENAC SODIUM 1 % EX GEL
2.0000 g | Freq: Three times a day (TID) | CUTANEOUS | Status: DC
  Administered 2022-11-14 – 2022-11-17 (×7): 2 g via TOPICAL
  Filled 2022-11-14 (×2): qty 100

## 2022-11-14 MED ORDER — ACETAMINOPHEN 325 MG PO TABS: 650.0000 mg | ORAL_TABLET | Freq: Four times a day (QID) | ORAL | Status: DC | PRN

## 2022-11-14 MED ORDER — AMLODIPINE BESYLATE 5 MG PO TABS
5.0000 mg | ORAL_TABLET | Freq: Every day | ORAL | Status: DC
  Administered 2022-11-15 – 2022-11-17 (×3): 5 mg via ORAL
  Filled 2022-11-14 (×3): qty 1

## 2022-11-14 MED ORDER — ENOXAPARIN SODIUM 40 MG/0.4ML IJ SOSY
40.0000 mg | PREFILLED_SYRINGE | INTRAMUSCULAR | Status: DC
  Administered 2022-11-14 – 2022-11-16 (×3): 40 mg via SUBCUTANEOUS
  Filled 2022-11-14 (×3): qty 0.4

## 2022-11-14 MED ORDER — LATANOPROST 0.005 % OP SOLN
1.0000 [drp] | Freq: Every day | OPHTHALMIC | Status: DC
  Administered 2022-11-16: 1 [drp] via OPHTHALMIC
  Filled 2022-11-14 (×2): qty 2.5

## 2022-11-14 MED ORDER — LORATADINE 10 MG PO TABS
10.0000 mg | ORAL_TABLET | Freq: Every day | ORAL | Status: DC
  Administered 2022-11-14 – 2022-11-17 (×4): 10 mg via ORAL
  Filled 2022-11-14 (×4): qty 1

## 2022-11-14 MED ORDER — LEVOTHYROXINE SODIUM 100 MCG PO TABS
100.0000 ug | ORAL_TABLET | Freq: Every day | ORAL | Status: DC
  Administered 2022-11-15 – 2022-11-17 (×3): 100 ug via ORAL
  Filled 2022-11-14 (×3): qty 1

## 2022-11-14 MED ORDER — QUETIAPINE FUMARATE 25 MG PO TABS: 100.0000 mg | ORAL_TABLET | Freq: Every day | ORAL | Status: DC

## 2022-11-14 MED ORDER — SODIUM CHLORIDE 0.9% FLUSH
3.0000 mL | Freq: Two times a day (BID) | INTRAVENOUS | Status: DC
  Administered 2022-11-14 – 2022-11-17 (×6): 3 mL via INTRAVENOUS

## 2022-11-14 MED ORDER — DILTIAZEM LOAD VIA INFUSION
5.0000 mg | Freq: Once | INTRAVENOUS | Status: AC
  Administered 2022-11-14: 5 mg via INTRAVENOUS
  Filled 2022-11-14: qty 5

## 2022-11-14 MED ORDER — DILTIAZEM HCL 25 MG/5ML IV SOLN
5.0000 mg | Freq: Once | INTRAVENOUS | Status: AC
  Administered 2022-11-14: 5 mg via INTRAVENOUS
  Filled 2022-11-14: qty 5

## 2022-11-14 MED ORDER — QUETIAPINE FUMARATE 50 MG PO TABS
100.0000 mg | ORAL_TABLET | Freq: Every day | ORAL | Status: DC
  Administered 2022-11-14 – 2022-11-16 (×3): 100 mg via ORAL
  Filled 2022-11-14 (×3): qty 2
  Filled 2022-11-14: qty 4

## 2022-11-14 MED ORDER — POTASSIUM CHLORIDE CRYS ER 20 MEQ PO TBCR
40.0000 meq | EXTENDED_RELEASE_TABLET | ORAL | Status: AC
  Administered 2022-11-14: 40 meq via ORAL
  Filled 2022-11-14: qty 2

## 2022-11-14 MED ORDER — ONDANSETRON HCL 4 MG PO TABS: 4.0000 mg | ORAL_TABLET | Freq: Four times a day (QID) | ORAL | Status: DC | PRN

## 2022-11-14 MED ORDER — HYDRALAZINE HCL 20 MG/ML IJ SOLN: 10.0000 mg | INTRAMUSCULAR | Status: DC | PRN

## 2022-11-14 MED ORDER — POTASSIUM CHLORIDE CRYS ER 20 MEQ PO TBCR
20.0000 meq | EXTENDED_RELEASE_TABLET | Freq: Every day | ORAL | Status: DC
  Administered 2022-11-14 – 2022-11-16 (×3): 20 meq via ORAL
  Filled 2022-11-14 (×3): qty 1

## 2022-11-14 NOTE — ED Notes (Signed)
Notified Smith MD that pt is in NSR rate of 84

## 2022-11-14 NOTE — Progress Notes (Addendum)
ANTICOAGULATION CONSULT NOTE - Follow-up Note  Pharmacy Consult for Heparin Indication: atrial fibrillation  Allergies  Allergen Reactions   Clindamycin Hcl Other (See Comments)    REACTION: C. difficile colitis   Sulfamethoxazole-Trimethoprim Other (See Comments)     Eye redness   Zanaflex [Tizanidine Hcl] Anaphylaxis   Metronidazole Hives   Statins Other (See Comments)    Side Effect: muscle pain Has tried Zocor, Crestor--she does not want to take a statin, period   Bee Venom Other (See Comments)    Unknown reaction   Ace Inhibitors Cough   Adhesive [Tape] Itching, Rash and Other (See Comments)    Blistering of skin     Patient Measurements: Height: 5\' 4"  (162.6 cm) Weight: 78.7 kg (173 lb 8 oz) IBW/kg (Calculated) : 54.7 Heparin Dosing Weight: 72 kg  Vital Signs: Temp: 98 F (36.7 C) (07/20 1313) Temp Source: Oral (07/20 1313) BP: 146/87 (07/20 1400) Pulse Rate: 85 (07/20 1400)  Labs: Recent Labs    11/14/22 0055 11/14/22 1500  HGB 12.9  --   HCT 40.2  --   PLT 174  --   HEPARINUNFRC  --  0.46  CREATININE 0.82  --   TROPONINIHS 12  --     Estimated Creatinine Clearance: 54.6 mL/min (by C-G formula based on SCr of 0.82 mg/dL).   Medical History: Past Medical History:  Diagnosis Date   Allergy    Arthritis    Chronic back pain    DDD (degenerative disc disease), lumbar    Diabetes mellitus without complication (HCC)    Diabetes type 2, uncontrolled 09/18/2006   Annotation: Noninsulin dependent Qualifier: Diagnosis of  By: Delrae Alfred MD, Lanora Manis     Former tobacco use    Hyperlipidemia    a. H/o muscle aches with statins per PCP notes.   Hypertension    Hypothyroidism    a. prior thyroid removal.   Memory loss or impairment 03/29/2019   Stroke [facility])    a. Listed in PCP notes: "History of stroke:  History of terrible headache.  Had a CT scan of brain and found "ministroke"  Was removed from ERT subsequently. "    Medications:  Medications Prior  to Admission  Medication Sig Dispense Refill Last Dose   amLODipine (NORVASC) 5 MG tablet TAKE 1 TABLET BY MOUTH EVERY DAY AFTER LUNCH (Patient taking differently: Take 5 mg by mouth daily.) 90 tablet 3 11/13/2022   Calcium Carbonate-Vit D-Min (CALCIUM 600+D3 PLUS MINERALS) 600-800 MG-UNIT TABS TAKE 1 TABLET BY MOUTH EVERY DAY AFTER LUNCH (Patient taking differently: Take 1 tablet by mouth daily after lunch.) 30 tablet 5 11/13/2022   cetirizine (ZYRTEC) 5 MG tablet TAKE 1 TABLET BY MOUTH EVERY DAY 90 tablet 1 11/13/2022   diclofenac Sodium (VOLTAREN) 1 % GEL APPLY 2GM TO LOWER BACK 3 TIMES DAILY (Patient taking differently: Apply 2 g topically in the morning, at noon, and at bedtime. Apply to lower back) 300 g PRN 11/13/2022   donepezil (ARICEPT) 5 MG tablet TAKE 1 TABLET BY MOUTH EVERY DAY at 5pm (Patient taking differently: Take 5 mg by mouth daily with supper.) 30 tablet 6 11/13/2022   famotidine (PEPCID) 40 MG tablet TAKE 1 TABLET BY MOUTH EVERY DAY (Patient taking differently: Take 40 mg by mouth daily.) 90 tablet 3 11/13/2022   levothyroxine (SYNTHROID) 112 MCG tablet Take 1 tablet (112 mcg total) by mouth daily. 90 tablet 3 11/13/2022   LUMIGAN 0.01 % SOLN INSTILL 1 DROP INTO EACH EYE AT BEDTIME  TO CONTROL INTRAOCULAR PRESSURE (Patient taking differently: Place 1 drop into both eyes at bedtime. To control intraocular pressure) 7.5 mL 1 11/13/2022   metFORMIN (GLUCOPHAGE) 500 MG tablet TAKE 1 TABLET BY MOUTH EVERY DAY AFTER LUNCH (Patient taking differently: Take 500 mg by mouth daily after lunch.) 90 tablet 1 11/13/2022 at pm   oxyCODONE (OXY IR/ROXICODONE) 5 MG immediate release tablet Take 10 mg by mouth 3 (three) times daily. Per Dr. Ethelene Hal   11/13/2022 at pm   pantoprazole (PROTONIX) 40 MG tablet TAKE 1 TABLET BY MOUTH EVERY DAY FOR GERD *do not crush* (Patient taking differently: Take 40 mg by mouth daily.) 30 tablet PRN 11/13/2022 at am   polyethylene glycol powder (GLYCOLAX/MIRALAX) 17 GM/SCOOP powder  Mix 17grams with liquid 2 times daily (Patient taking differently: Take 17 g by mouth in the morning and at bedtime.) 3350 g 1 11/13/2022 at pm   QUEtiapine (SEROQUEL) 100 MG tablet TAKE 1 TABLET BY MOUTH EVERY DAY AT 5pm (Patient taking differently: Take 100 mg by mouth daily after supper.) 90 tablet 1 11/13/2022 at pm   traZODone (DESYREL) 100 MG tablet TAKE 1 TABLET BY MOUTH EVERY DAY AT 5pm (Patient taking differently: Take 100 mg by mouth at bedtime.) 90 tablet 0 11/13/2022 at pm   potassium chloride SA (KLOR-CON) 20 MEQ tablet Take 1 tablet (20 mEq total) by mouth daily after lunch. 30 tablet 5 UNKNOWN   Scheduled:   [START ON 11/15/2022] amLODipine  5 mg Oral Daily   diclofenac Sodium  2 g Topical TID   donepezil  5 mg Oral Q supper   enoxaparin (LOVENOX) injection  40 mg Subcutaneous Q24H   famotidine  40 mg Oral Daily   insulin aspart  0-9 Units Subcutaneous TID WC   [START ON 11/15/2022] latanoprost  1 drop Both Eyes QHS   [START ON 11/15/2022] levothyroxine  100 mcg Oral Q0600   loratadine  10 mg Oral Daily   oxyCODONE  5 mg Oral TID   polyethylene glycol  17 g Oral BID   potassium chloride SA  20 mEq Oral QPC lunch   QUEtiapine  100 mg Oral QPC supper   sodium chloride flush  3 mL Intravenous Q12H   traZODone  100 mg Oral QHS   Infusions:  PRN: acetaminophen **OR** acetaminophen, albuterol, hydrALAZINE, ondansetron **OR** ondansetron (ZOFRAN) IV  Assessment: 51 yof with a history of HTN, HLD, prior CVA, T2DM, dementia. Patient is presenting with fall. Heparin per pharmacy consult placed for atrial fibrillation.  Patient was not on anticoagulation prior to arrival. Started on 1050 units/hr IV heparin following 4000 unit IV heparin bolus. Resulting heparin level is 0.46 which is therapeutic.  No issues with infusion or bleeding per RN.  Hgb 12.9; plt 174  Goal of Therapy:  Heparin level 0.3-0.7 units/ml Monitor platelets by anticoagulation protocol: Yes   Plan:  TRH has  discontinued heparin gtt Will discontinue pharmacy consults and heparin monitoring accordingly  Delmar Landau, PharmD, BCPS 11/14/2022 3:49 PM ED Clinical Pharmacist -  [phone]

## 2022-11-14 NOTE — ED Provider Notes (Signed)
[place] EMERGENCY DEPARTMENT AT [facility] Provider Note   CSN: [id number] Arrival date & time: 11/14/22  0054     History  Chief Complaint  Patient presents with   Fall    Joy Ward is a 82 y.o. female.  Patient presents to the emergency department for evaluation of generalized weakness with near fall.  Patient was walking when she started to feel very weak, family gently assisted her to the ground.  No fall.  She was recently treated for urinary tract infection, no persistent symptoms.  No fever.  Family also concerned that the patient may have a vaginal foreign body.  She has some dementia and has been aggressively wiping down there.  Family noticed that a tampon was missing tonight.         Home Medications Prior to Admission medications   Medication Sig Start Date End Date Taking? Authorizing Provider  aluminum-magnesium hydroxide-simethicone (MAALOX) 200-200-20 MG/5ML SUSP Take 30 mLs by mouth every 6 (six) hours as needed (heartburn/indigestion).    [provider]  AMBULATORY NON FORMULARY MEDICATION Discontinue use of heating pad and Oxycodone 2.5 mg daily 11/12/21   Fargo, [name] E, NP  amLODipine (NORVASC) 5 MG tablet TAKE 1 TABLET BY MOUTH EVERY DAY AFTER LUNCH 12/30/21   Fargo, [name] E, NP  Calcium Carbonate-Vit D-Min (CALCIUM 600+D3 PLUS MINERALS) 600-800 MG-UNIT TABS TAKE 1 TABLET BY MOUTH EVERY DAY AFTER LUNCH 11/02/22   Fargo, [name] E, NP  cetirizine (ZYRTEC) 5 MG tablet TAKE 1 TABLET BY MOUTH EVERY DAY 11/12/22   Fargo, [name] E, NP  diclofenac Sodium (VOLTAREN) 1 % GEL APPLY 2GM TO LOWER BACK 3 TIMES DAILY 06/15/22   Fargo, [name] E, NP  docusate sodium (COLACE) 250 MG capsule Take 1 capsule (250 mg total) by mouth daily as needed for constipation. 10/16/20   Rushie Chestnut, PA-C  donepezil (ARICEPT) 5 MG tablet TAKE 1 TABLET BY MOUTH EVERY DAY at 5pm 09/17/22   Fargo, [name] E, NP  famotidine (PEPCID) 40 MG tablet TAKE 1 TABLET BY MOUTH EVERY DAY 12/30/21    Fargo, [name] E, NP  levothyroxine (SYNTHROID) 112 MCG tablet Take 1 tablet (112 mcg total) by mouth daily. 06/19/22   Frederica Kuster, MD  loperamide (IMODIUM) 2 MG capsule Take by mouth as needed for diarrhea or loose stools.    [provider]  LUMIGAN 0.01 % SOLN INSTILL 1 DROP INTO EACH EYE AT BEDTIME TO CONTROL INTRAOCULAR PRESSURE 09/30/22   Fargo, [name] E, NP  magnesium hydroxide (MILK OF MAGNESIA) 400 MG/5ML suspension Take by mouth daily as needed for mild constipation.    [provider]  metFORMIN (GLUCOPHAGE) 500 MG tablet TAKE 1 TABLET BY MOUTH EVERY DAY AFTER LUNCH 07/28/22   Fargo, [name] E, NP  oxyCODONE (OXY IR/ROXICODONE) 5 MG immediate release tablet Take 10 mg by mouth 3 (three) times daily. Per Dr. Ethelene Hal    [provider]  pantoprazole (PROTONIX) 40 MG tablet TAKE 1 TABLET BY MOUTH EVERY DAY FOR GERD *do not crush* 05/28/22   Fargo, [name] E, NP  polyethylene glycol powder (GLYCOLAX/MIRALAX) 17 GM/SCOOP powder Mix 17grams with liquid 2 times daily 09/01/21   Fargo, [name] E, NP  potassium chloride SA (KLOR-CON) 20 MEQ tablet Take 1 tablet (20 mEq total) by mouth daily after lunch. 08/29/20   Fargo, [name] E, NP  QUEtiapine (SEROQUEL) 100 MG tablet TAKE 1 TABLET BY MOUTH EVERY DAY AT 5pm 06/24/22   Octavia Heir, NP  traZODone (DESYREL) 100 MG tablet TAKE 1 TABLET BY MOUTH EVERY DAY AT 5pm 11/02/22   Fargo, [name] E, NP      Allergies    Clindamycin hcl, Sulfamethoxazole-trimethoprim, Zanaflex [tizanidine hcl], Metronidazole, Statins, Bee venom, Ace inhibitors, and Adhesive [tape]    Review of Systems   Review of Systems  Physical Exam Updated Vital Signs BP 134/84   Pulse (!) 113   Temp 98 F (36.7 C) (Oral)   Resp 14   Ht 5\' 4"  (1.626 m)   Wt 78.7 kg   SpO2 97%   BMI 29.78 kg/m  Physical Exam Vitals and nursing note reviewed.  Constitutional:      General: She is not in acute distress.    Appearance: She is well-developed.  HENT:     Head: Normocephalic and  atraumatic.     Mouth/Throat:     Mouth: Mucous membranes are moist.  Eyes:     General: Vision grossly intact. Gaze aligned appropriately.     Extraocular Movements: Extraocular movements intact.     Conjunctiva/sclera: Conjunctivae normal.  Cardiovascular:     Rate and Rhythm: Tachycardia present. Rhythm irregular.     Pulses: Normal pulses.     Heart sounds: Normal heart sounds, S1 normal and S2 normal. No murmur heard.    No friction rub. No gallop.  Pulmonary:     Effort: Pulmonary effort is normal. No respiratory distress.     Breath sounds: Normal breath sounds.  Abdominal:     General: Bowel sounds are normal.     Palpations: Abdomen is soft.     Tenderness: There is no abdominal tenderness. There is no guarding or rebound.     Hernia: No hernia is present.  Musculoskeletal:        General: No swelling.     Cervical back: Full passive range of motion without pain, normal range of motion and neck supple. No spinous process tenderness or muscular tenderness. Normal range of motion.     Right lower leg: No edema.     Left lower leg: No edema.  Skin:    General: Skin is warm and dry.     Capillary Refill: Capillary refill takes less than 2 seconds.     Findings: No ecchymosis, erythema, rash or wound.  Neurological:     General: No focal deficit present.     Mental Status: She is alert and oriented to person, place, and time.     GCS: GCS eye subscore is 4. GCS verbal subscore is 5. GCS motor subscore is 6.     Cranial Nerves: Cranial nerves 2-12 are intact.     Sensory: Sensation is intact.     Motor: Motor function is intact.     Coordination: Coordination is intact.  Psychiatric:        Attention and Perception: Attention normal.        Mood and Affect: Mood normal.        Speech: Speech normal.        Behavior: Behavior normal.     ED Results / Procedures / Treatments   Labs (all labs ordered are listed, but only abnormal results are displayed) Labs Reviewed   BASIC METABOLIC PANEL - Abnormal; Notable for the following components:      Result Value   Potassium 3.1 (*)    Glucose, Bld 179 (*)    All other components within normal limits  URINALYSIS, ROUTINE W REFLEX MICROSCOPIC - Abnormal; Notable for the following  components:   Hgb urine dipstick SMALL (*)    Ketones, ur 5 (*)    All other components within normal limits  CBC WITH DIFFERENTIAL/PLATELET  HEPARIN LEVEL (UNFRACTIONATED)  TROPONIN I (HIGH SENSITIVITY)  TROPONIN I (HIGH SENSITIVITY)    EKG EKG Interpretation Date/Time:  Saturday November 14 2022 03:32:18 EDT Ventricular Rate:  123 PR Interval:  171 QRS Duration:  82 QT Interval:  322 QTC Calculation: 461 R Axis:   78  Text Interpretation: Atrial fibrillation Ventricular bigeminy Confirmed by Gilda Crease [phone]) on 11/14/2022 5:31:18 AM  Radiology No results found.  Procedures Procedures    Medications Ordered in ED Medications  diltiazem (CARDIZEM) 1 mg/mL load via infusion 5 mg (5 mg Intravenous Bolus from Bag 11/14/22 0432)    And  diltiazem (CARDIZEM) 125 mg in dextrose 5% 125 mL (1 mg/mL) infusion (5 mg/hr Intravenous New Bag/Given 11/14/22 0413)  heparin bolus via infusion 4,000 Units (has no administration in time range)  heparin ADULT infusion 100 units/mL (25000 units/283mL) (has no administration in time range)  diltiazem (CARDIZEM) injection 5 mg (5 mg Intravenous Given 11/14/22 0216)  sodium chloride 0.9 % bolus 1,000 mL (0 mLs Intravenous Stopped 11/14/22 0506)    ED Course/ Medical Decision Making/ A&P                             Medical Decision Making Amount and/or Complexity of Data Reviewed External Data Reviewed: labs, ECG and notes. Labs: ordered. Decision-making details documented in ED Course. Radiology: ordered and independent interpretation performed. Decision-making details documented in ED Course.  Risk Prescription drug management.   Presents to the emergency department  for evaluation of generalized weakness.  Differential diagnosis considered includes, but not limited to: TIA; stroke; metabolic encephalopathy; sepsis; ACS  Reportedly had been doing well yesterday.  Today, however, she had not been feeling well this evening.  Got up to walk and felt very weak, felt like she was going to pass out.  Family gently helped her to the ground.  She does not have any pain, other than her chronic back pain.  She appears well at arrival.  She was, however, noted to be tachycardic.  Rhythm is irregular, suspect atrial fibrillation, although there is frequent PVCs and this could be junctional tachycardia as well.  Given a Cardizem bolus and heart rate dropped into the 115 range.  He did seem narrow complex, irregularly irregular.  Patient placed on Cardizem drip with better rate control.  No ST elevation or depression.  First troponin negative.  Second troponin elevated.  This is likely leak from rapid heart rate.  Will anticoagulate while rhythm and cardiac workup pursued.  Unable to get a call back from cardiology, will consult medicine for admission  CRITICAL CARE Performed by: Gilda Crease   Total critical care time: 30 minutes  Critical care time was exclusive of separately billable procedures and treating other patients.  Critical care was necessary to treat or prevent imminent or life-threatening deterioration.  Critical care was time spent personally by me on the following activities: development of treatment plan with patient and/or surrogate as well as nursing, discussions with consultants, evaluation of patient's response to treatment, examination of patient, obtaining history from patient or surrogate, ordering and performing treatments and interventions, ordering and review of laboratory studies, ordering and review of radiographic studies, pulse oximetry and re-evaluation of patient's condition.  Final Clinical Impression(s) / ED  Diagnoses Final diagnoses:  Atrial fibrillation with [facility])    Rx / DC Orders ED Discharge Orders     None         Jaylina Ramdass, Canary Brim, MD 11/14/22 [phone]

## 2022-11-14 NOTE — ED Notes (Signed)
ED TO INPATIENT HANDOFF REPORT  ED Nurse Name and Phone #: Denton Ar RN [phone]  S Name/Age/Gender Joy Ward 82 y.o. female Room/Bed: 042C/042C  Code Status   Code Status: Full Code  Home/SNF/Other Home Patient oriented to: self Is this baseline? Yes   Triage Complete: Triage complete  Chief Complaint Atrial fibrillation [facility]) [I48.91]  Triage Note Pt brought from home by EMS for assisted fall. EMS reports patient walking to bathroom when she started feeling weak, patient started to fall when family assisted her to the ground. (-) hit head, (-) LOC, (-) thinners. Sinus tach on monitor on arrival. Pt alert to self  (baseline). No recent sickness per family. EMS states that patient reports lower abdominal pain. Pt denies any abdominal pain at this time but states her right arm is a little sore. Denies N/V/D/fever.    Allergies Allergies  Allergen Reactions   Clindamycin Hcl Other (See Comments)    REACTION: C. difficile colitis   Sulfamethoxazole-Trimethoprim Other (See Comments)     Eye redness   Zanaflex [Tizanidine Hcl] Anaphylaxis   Metronidazole Hives   Statins Other (See Comments)    Side Effect: muscle pain Has tried Zocor, Crestor--she does not want to take a statin, period   Bee Venom Other (See Comments)    Unknown reaction   Ace Inhibitors Cough   Adhesive [Tape] Itching, Rash and Other (See Comments)    Blistering of skin     Level of Care/Admitting Diagnosis ED Disposition     ED Disposition  Admit   Condition  --   Comment  Hospital Area: [facility] [[id number]]  Level of Care: Telemetry Cardiac [[id number]]  May place patient in observation at [facility] or [facility] if equivalent level of care is available:: No  Covid Evaluation: Asymptomatic - no recent exposure (last 10 days) testing not required  Diagnosis: Atrial fibrillation (HCC) [427.31.ICD-9-CM]  Admitting Physician: Clydie Braun [[id number]]  Attending Physician:  Clydie Braun [[id number]]          B Medical/Surgery History Past Medical History:  Diagnosis Date   Allergy    Arthritis    Chronic back pain    DDD (degenerative disc disease), lumbar    Diabetes mellitus without complication (HCC)    Diabetes type 2, uncontrolled 09/18/2006   Annotation: Noninsulin dependent Qualifier: Diagnosis of  By: Delrae Alfred MD, Lanora Manis     Former tobacco use    Hyperlipidemia    a. H/o muscle aches with statins per PCP notes.   Hypertension    Hypothyroidism    a. prior thyroid removal.   Memory loss or impairment 03/29/2019   Stroke [facility])    a. Listed in PCP notes: "History of stroke:  History of terrible headache.  Had a CT scan of brain and found "ministroke"  Was removed from ERT subsequently. "   Past Surgical History:  Procedure Laterality Date   ABDOMINAL HYSTERECTOMY  1994   TAH, not clear if unilateral oophorectomy as well.   CESAREAN SECTION  1972, 78, 1978   CHOLECYSTECTOMY  1973   open   DECOMPRESSIVE LUMBAR LAMINECTOMY LEVEL 4  2005   In [place]   THYROIDECTOMY  2012   multinodular goiter     A IV Location/Drains/Wounds Patient Lines/Drains/Airways Status     Active Line/Drains/Airways     Name Placement date Placement time Site Days   Peripheral IV 11/14/22 20 G 1.88" Anterior;Right Forearm 11/14/22  0729  Forearm  less than 1  Peripheral IV 11/14/22 20 G 1.88" Left Antecubital 11/14/22  1034  Antecubital  less than 1            Intake/Output Last 24 hours  Intake/Output Summary (Last 24 hours) at 11/14/2022 1412 Last data filed at 11/14/2022 0506 Gross per 24 hour  Intake 1000 ml  Output --  Net 1000 ml    Labs/Imaging Results for orders placed or performed during the hospital encounter of 11/14/22 (from the past 48 hour(s))  Basic metabolic panel     Status: Abnormal   Collection Time: 11/14/22 12:55 AM  Result Value Ref Range   Sodium 138 135 - 145 mmol/L   Potassium 3.1 (L) 3.5 - 5.1 mmol/L    Chloride 103 98 - 111 mmol/L   CO2 23 22 - 32 mmol/L   Glucose, Bld 179 (H) 70 - 99 mg/dL    Comment: Glucose reference range applies only to samples taken after fasting for at least 8 hours.   BUN 9 8 - 23 mg/dL   Creatinine, Ser 1.61 0.44 - 1.00 mg/dL   Calcium 9.5 8.9 - 09.6 mg/dL   GFR, Estimated >04 >54 mL/min    Comment: (NOTE) Calculated using the CKD-EPI Creatinine Equation (2021)    Anion gap 12 5 - 15    Comment: Performed at [facility], [address] [place], [place] [address]  CBC with Differential     Status: None   Collection Time: 11/14/22 12:55 AM  Result Value Ref Range   WBC 8.7 4.0 - 10.5 K/uL   RBC 4.58 3.87 - 5.11 MIL/uL   Hemoglobin 12.9 12.0 - 15.0 g/dL   HCT 91.4 78.2 - 95.6 %   MCV 87.8 80.0 - 100.0 fL   MCH 28.2 26.0 - 34.0 pg   MCHC 32.1 30.0 - 36.0 g/dL   RDW 21.3 08.6 - 57.8 %   Platelets 174 150 - 400 K/uL   nRBC 0.0 0.0 - 0.2 %   Neutrophils Relative % 72 %   Neutro Abs 6.2 1.7 - 7.7 K/uL   Lymphocytes Relative 18 %   Lymphs Abs 1.6 0.7 - 4.0 K/uL   Monocytes Relative 8 %   Monocytes Absolute 0.7 0.1 - 1.0 K/uL   Eosinophils Relative 1 %   Eosinophils Absolute 0.1 0.0 - 0.5 K/uL   Basophils Relative 0 %   Basophils Absolute 0.0 0.0 - 0.1 K/uL   Immature Granulocytes 1 %   Abs Immature Granulocytes 0.04 0.00 - 0.07 K/uL    Comment: Performed at [facility], [address] [place], [place] [address]  Troponin I (High Sensitivity)     Status: None   Collection Time: 11/14/22 12:55 AM  Result Value Ref Range   Troponin I (High Sensitivity) 12 <18 ng/L    Comment: (NOTE) Elevated high sensitivity troponin I (hsTnI) values and significant  changes across serial measurements may suggest ACS but many other  chronic and acute conditions are known to elevate hsTnI results.  Refer to the "Links" section for chest pain algorithms and additional  guidance. Performed at [facility], [address] [place],  [place] [address]   Magnesium     Status: None   Collection Time: 11/14/22 12:55 AM  Result Value Ref Range   Magnesium 1.9 1.7 - 2.4 mg/dL    Comment: Performed at [facility], [address], [place], [place] [address]  TSH     Status: Abnormal  Collection Time: 11/14/22 12:55 AM  Result Value Ref Range   TSH 0.017 (L) 0.350 - 4.500 uIU/mL    Comment: Performed by a 3rd Generation assay with a functional sensitivity of <=0.01 uIU/mL. Performed at [facility], [address], [place], [place] [address]   Urinalysis, Routine w reflex microscopic -Urine, Clean Catch     Status: Abnormal   Collection Time: 11/14/22  2:23 AM  Result Value Ref Range   Color, Urine YELLOW YELLOW   APPearance CLEAR CLEAR   Specific Gravity, Urine 1.008 1.005 - 1.030   pH 5.0 5.0 - 8.0   Glucose, UA NEGATIVE NEGATIVE mg/dL   Hgb urine dipstick SMALL (A) NEGATIVE   Bilirubin Urine NEGATIVE NEGATIVE   Ketones, ur 5 (A) NEGATIVE mg/dL   Protein, ur NEGATIVE NEGATIVE mg/dL   Nitrite NEGATIVE NEGATIVE   Leukocytes,Ua NEGATIVE NEGATIVE   RBC / HPF 0-5 0 - 5 RBC/hpf   WBC, UA 0-5 0 - 5 WBC/hpf   Bacteria, UA NONE SEEN NONE SEEN   Squamous Epithelial / HPF 0-5 0 - 5 /HPF   Mucus PRESENT    Hyaline Casts, UA PRESENT     Comment: Performed at [facility], [address], [place], [place] [address]  CBG monitoring, ED     Status: Abnormal   Collection Time: 11/14/22 12:36 PM  Result Value Ref Range   Glucose-Capillary 190 (H) 70 - 99 mg/dL    Comment: Glucose reference range applies only to samples taken after fasting for at least 8 hours.   DG Chest Port 1 View  Result Date: 11/14/2022 CLINICAL DATA:  Fall. EXAM: PORTABLE CHEST 1 VIEW COMPARISON:  10/20/2020 FINDINGS: The lungs are clear without focal pneumonia, edema, pneumothorax or pleural effusion. Pulmonary nodule overlying the lateral right base is new in the interval. Cardiopericardial silhouette is at upper limits of normal for size.  No acute bony abnormality. Telemetry leads overlie the chest. IMPRESSION: 1. No acute cardiopulmonary findings. 2. New pulmonary nodule overlying the lateral right base. CT chest without contrast recommended to further evaluate. Electronically Signed   By: Kennith Center M.D.   On: 11/14/2022 06:03    Pending Labs Unresulted Labs (From admission, onward)     Start     Ordered   11/15/22 0500  Heparin level (unfractionated)  Daily,   R      11/14/22 0537   11/15/22 0500  CBC  Daily,   R      11/14/22 0537   11/15/22 0500  Basic metabolic panel  Tomorrow morning,   R        11/14/22 0802   11/14/22 1400  Heparin level (unfractionated)  Once-Timed,   URGENT        11/14/22 0535   11/14/22 0806  Hemoglobin A1c  Once,   R       Comments: To assess prior glycemic control    11/14/22 0805            Vitals/Pain Today's Vitals   11/14/22 1030 11/14/22 1100 11/14/22 1313 11/14/22 1400  BP: (!) 170/86 (!) 158/83 139/82 (!) 146/87  Pulse: 82 83 94 85  Resp: (!) 22 19 16 15   Temp:   98 F (36.7 C)   TempSrc:   Oral   SpO2: 97% 96% 99% 100%  Weight:      Height:      PainSc:        Isolation Precautions No active isolations  Medications  Medications  heparin ADULT infusion 100 units/mL (25000 units/216mL) (1,050 Units/hr Intravenous New Bag/Given 11/14/22 4098)  oxyCODONE (Oxy IR/ROXICODONE) immediate release tablet 5 mg (5 mg Oral Given 11/14/22 1124)  donepezil (ARICEPT) tablet 5 mg (has no administration in time range)  traZODone (DESYREL) tablet 100 mg (has no administration in time range)  famotidine (PEPCID) tablet 40 mg (40 mg Oral Given 11/14/22 1123)  polyethylene glycol (MIRALAX / GLYCOLAX) packet 17 g (17 g Oral Given 11/14/22 1123)  loratadine (CLARITIN) tablet 10 mg (10 mg Oral Given 11/14/22 1124)  potassium chloride SA (KLOR-CON M) CR tablet 20 mEq (20 mEq Oral Given 11/14/22 1337)  diclofenac Sodium (VOLTAREN) 1 % topical gel 2 g (2 g Topical Patient Refused/Not Given  11/14/22 1123)  latanoprost (XALATAN) 0.005 % ophthalmic solution 1 drop (has no administration in time range)  sodium chloride flush (NS) 0.9 % injection 3 mL (3 mLs Intravenous Not Given 11/14/22 1124)  acetaminophen (TYLENOL) tablet 650 mg (has no administration in time range)    Or  acetaminophen (TYLENOL) suppository 650 mg (has no administration in time range)  albuterol (PROVENTIL) (2.5 MG/3ML) 0.083% nebulizer solution 2.5 mg (has no administration in time range)  ondansetron (ZOFRAN) tablet 4 mg (has no administration in time range)    Or  ondansetron (ZOFRAN) injection 4 mg (has no administration in time range)  insulin aspart (novoLOG) injection 0-9 Units (2 Units Subcutaneous Given 11/14/22 1340)  levothyroxine (SYNTHROID) tablet 100 mcg (has no administration in time range)  hydrALAZINE (APRESOLINE) injection 10 mg (has no administration in time range)  QUEtiapine (SEROQUEL) tablet 100 mg (100 mg Oral Given 11/14/22 1337)  diltiazem (CARDIZEM) injection 5 mg (5 mg Intravenous Given 11/14/22 0216)  sodium chloride 0.9 % bolus 1,000 mL (0 mLs Intravenous Stopped 11/14/22 0506)  diltiazem (CARDIZEM) 1 mg/mL load via infusion 5 mg (5 mg Intravenous Bolus from Bag 11/14/22 0432)  heparin bolus via infusion 4,000 Units (4,000 Units Intravenous Bolus from Bag 11/14/22 0634)  oxyCODONE (Oxy IR/ROXICODONE) immediate release tablet 5 mg (5 mg Oral Given 11/14/22 0640)  potassium chloride SA (KLOR-CON M) CR tablet 40 mEq (40 mEq Oral Given 11/14/22 1123)    Mobility Unknown, has been in bed while in the ED     Focused Assessments Cardiac Assessment Handoff:  Cardiac Rhythm: Heart block, Normal sinus rhythm Lab Results  Component Value Date   CKTOTAL 65 08/22/2009   CKMB 1.0 08/22/2009   TROPONINI <0.03 07/05/2016   Lab Results  Component Value Date   DDIMER 3.52 (H) 07/30/2019     , Neuro Assessment Handoff:   Cardiac Rhythm: Heart block, Normal sinus rhythm       Neuro  Assessment: Exceptions to WDL    R Recommendations: See Admitting Provider Note  Report given to:   Additional Notes:  Pt w/ dementia at baseline, oriented to self. Pt currently in bilateral wrist soft nonviolent restraints d/t agitation and pulling at equipment and tried removing a 3rd IV.   Mathis Fare, RN (Registered Nurse)  Date of Service: 11/14/2022 12:56 AM  Signed Pt brought from home by EMS for assisted fall. EMS reports patient walking to bathroom when she started feeling weak, patient started to fall when family assisted her to the ground. (-) hit head, (-) LOC, (-) thinners. Sinus tach on monitor on arrival. Pt alert to self  (baseline). No recent sickness per family. EMS states that patient reports lower abdominal pain. Pt denies any abdominal pain at this time  but states her right arm is a little sore. Denies N/V/D/fever.

## 2022-11-14 NOTE — H&P (Addendum)
History and Physical    Patient: Joy Ward WUX:[id number] DOB: 1940-07-03 DOA: 11/14/2022 DOS: the patient was seen and examined on 11/14/2022 PCP: Octavia Heir, NP  Patient coming from: Home lives with son and daughter via EMS  Chief Complaint:  Chief Complaint  Patient presents with   Fall   HPI: Joy Ward is a 82 y.o. female with medical history significant of hypertension, hyperlipidemia unable to tolerate statins, prior CVA, diabetes mellitus type 2, postsurgical hypothyroidism, dementia, and remote history of tobacco use who presents after having a fall.  Patient is obtained from the patient's son due to her history of dementia.  She is alert and oriented to self, but son states that she often confuses him for her late husband who passed away approximately 19 years ago.  At baseline patient ambulates without assistance.  Last night she had gotten up and was walking when she started feeling weak and she fell.  Family was nearby and able to catch her got her to the ground.  Son notes that her eyes rolled back in her head.  He states there was no loss of consciousness or trauma to her head.  She had been in her normal state of health eating and drinking like normal.  There have been no reports of fever, chest pain, nausea, vomiting, abdominal pain, shortness of breath, or dysuria.  Patient reports having mild intermittent cough which son states is unchanged.  No prior history of irregular heart rhythm noted before.  Patient's son that she stop drinking alcohol and smoking cigarettes in the 1960s when she found God.  In the emergency department patient was noted to be afebrile, pulse elevated into the 130s, respirations 14-37, blood pressures elevated up to 156/82, and O2 saturations maintained on room air.  Labs reviewed for potassium 3.1, glucose 179, and high-sensitivity troponin 12.  Chest x-ray noted n pulmonary nodule overlying the lateral right base of the lung.  Patient has been  given 1 L normal saline IV fluids as well as started on Cardizem and heparin drips.  Review of Systems: As mentioned in the history of present illness. All other systems reviewed and are negative.  Limited due to patient being demented. Past Medical History:  Diagnosis Date   Allergy    Arthritis    Chronic back pain    DDD (degenerative disc disease), lumbar    Diabetes mellitus without complication (HCC)    Diabetes type 2, uncontrolled 09/18/2006   Annotation: Noninsulin dependent Qualifier: Diagnosis of  By: Delrae Alfred MD, Lanora Manis     Former tobacco use    Hyperlipidemia    a. H/o muscle aches with statins per PCP notes.   Hypertension    Hypothyroidism    a. prior thyroid removal.   Memory loss or impairment 03/29/2019   Stroke [facility])    a. Listed in PCP notes: "History of stroke:  History of terrible headache.  Had a CT scan of brain and found "ministroke"  Was removed from ERT subsequently. "   Past Surgical History:  Procedure Laterality Date   ABDOMINAL HYSTERECTOMY  1994   TAH, not clear if unilateral oophorectomy as well.   CESAREAN SECTION  1972, 33, 1978   CHOLECYSTECTOMY  1973   open   DECOMPRESSIVE LUMBAR LAMINECTOMY LEVEL 4  2005   [place]   THYROIDECTOMY  2012   multinodular goiter   Social History:  reports that she has quit smoking. She has never used smokeless tobacco. She reports that she  does not drink alcohol and does not use drugs.  Allergies  Allergen Reactions   Clindamycin Hcl Other (See Comments)    REACTION: C. difficile colitis   Sulfamethoxazole-Trimethoprim Other (See Comments)     Eye redness   Zanaflex [Tizanidine Hcl] Anaphylaxis   Metronidazole Hives   Statins Other (See Comments)    Side Effect: muscle pain Has tried Zocor, Crestor--she does not want to take a statin, period   Bee Venom Other (See Comments)    Unknown reaction   Ace Inhibitors Cough   Adhesive [Tape] Itching, Rash and Other (See Comments)    Blistering of  skin     Family History  Problem Relation Age of Onset   Heart disease Mother        unclear details "atherosclerosis"   Diabetes Mother    Alcohol abuse Sister    Diabetes Brother    Diabetes Brother    Drug abuse Brother    Diabetes Daughter    Heart disease Son        CHF   Alcohol abuse Son     Prior to Admission medications   Medication Sig Start Date End Date Taking? Authorizing Provider  amLODipine (NORVASC) 5 MG tablet TAKE 1 TABLET BY MOUTH EVERY DAY AFTER LUNCH Patient taking differently: Take 5 mg by mouth daily. 12/30/21  Yes Fargo, [name] E, NP  Calcium Carbonate-Vit D-Min (CALCIUM 600+D3 PLUS MINERALS) 600-800 MG-UNIT TABS TAKE 1 TABLET BY MOUTH EVERY DAY AFTER LUNCH Patient taking differently: Take 1 tablet by mouth daily after lunch. 11/02/22  Yes Fargo, [name] E, NP  cetirizine (ZYRTEC) 5 MG tablet TAKE 1 TABLET BY MOUTH EVERY DAY 11/12/22  Yes Fargo, [name] E, NP  diclofenac Sodium (VOLTAREN) 1 % GEL APPLY 2GM TO LOWER BACK 3 TIMES DAILY Patient taking differently: Apply 2 g topically in the morning, at noon, and at bedtime. Apply to lower back 06/15/22  Yes Fargo, [name] E, NP  donepezil (ARICEPT) 5 MG tablet TAKE 1 TABLET BY MOUTH EVERY DAY at 5pm Patient taking differently: Take 5 mg by mouth daily with supper. 09/17/22  Yes Fargo, [name] E, NP  famotidine (PEPCID) 40 MG tablet TAKE 1 TABLET BY MOUTH EVERY DAY Patient taking differently: Take 40 mg by mouth daily. 12/30/21  Yes Fargo, [name] E, NP  levothyroxine (SYNTHROID) 112 MCG tablet Take 1 tablet (112 mcg total) by mouth daily. 06/19/22  Yes Frederica Kuster, MD  LUMIGAN 0.01 % SOLN INSTILL 1 DROP INTO EACH EYE AT BEDTIME TO CONTROL INTRAOCULAR PRESSURE Patient taking differently: Place 1 drop into both eyes at bedtime. To control intraocular pressure 09/30/22  Yes Fargo, [name] E, NP  metFORMIN (GLUCOPHAGE) 500 MG tablet TAKE 1 TABLET BY MOUTH EVERY DAY AFTER LUNCH Patient taking differently: Take 500 mg by mouth daily after lunch.  07/28/22  Yes Fargo, [name] E, NP  oxyCODONE (OXY IR/ROXICODONE) 5 MG immediate release tablet Take 10 mg by mouth 3 (three) times daily. Per Dr. Ethelene Hal   Yes [provider]  pantoprazole (PROTONIX) 40 MG tablet TAKE 1 TABLET BY MOUTH EVERY DAY FOR GERD *do not crush* Patient taking differently: Take 40 mg by mouth daily. 05/28/22  Yes Fargo, [name] E, NP  polyethylene glycol powder (GLYCOLAX/MIRALAX) 17 GM/SCOOP powder Mix 17grams with liquid 2 times daily Patient taking differently: Take 17 g by mouth in the morning and at bedtime. 09/01/21  Yes Fargo, [name] E, NP  QUEtiapine (SEROQUEL) 100 MG tablet TAKE 1 TABLET BY MOUTH  EVERY DAY AT 5pm Patient taking differently: Take 100 mg by mouth daily after supper. 06/24/22  Yes Fargo, [name] E, NP  traZODone (DESYREL) 100 MG tablet TAKE 1 TABLET BY MOUTH EVERY DAY AT 5pm Patient taking differently: Take 100 mg by mouth at bedtime. 11/02/22  Yes Fargo, [name] E, NP  potassium chloride SA (KLOR-CON) 20 MEQ tablet Take 1 tablet (20 mEq total) by mouth daily after lunch. 08/29/20   Octavia Heir, NP    Physical Exam: Vitals:   11/14/22 0430 11/14/22 0515 11/14/22 0630 11/14/22 0700  BP: 134/84 137/80 (!) 144/76 (!) 147/74  Pulse: (!) 129 (!) 113 (!) 110 (!) 109  Resp: (!) 25 14 (!) 22 (!) 24  Temp:      TempSrc:      SpO2: 99% 97% 97% 95%  Weight:      Height:       Exam  Constitutional: Elderly female who appears to be in no acute distress Eyes: PERRL, lids and conjunctivae normal ENMT: Mucous membranes are moist.  Hard of hearing. Neck: normal, supple  Respiratory: clear to auscultation bilaterally, no wheezing, no crackles. Normal respiratory effort. No accessory muscle use.  Cardiovascular: Irregular.  No extremity edema. 2+ pedal pulses.   Abdomen: no tenderness, no masses palpated. Bowel sounds positive.  Musculoskeletal: no clubbing / cyanosis. No joint deformity upper and lower extremities. Good ROM, no contractures. Normal muscle tone.  Skin: no  rashes, lesions, ulcers. No induration Neurologic: CN 2-12 grossly intact  Strength 5/5 in all 4.  Psychiatric: Alert and oriented to self.  Patient does get agitated with her son at times thought secondary to her not hearing clearly what was said.  Data Reviewed:  EKG revealed concern for an irregular heart rhythm at 104 bpm with intermittent PVCs.  Reviewed labs, imaging, and pertinent records as noted above in HPI.  Assessment and Plan:  Fall  Second-degree heart block Acute.  Patient reported to have near syncopal event with initial tracings thought to be atrial fibrillation with heart rates elevated into the 130s.  She had been started on a Cardizem and heparin drip as initial CHA2DS2-VASc score at least 5 based off age, sex, and note of prior stroke.  I discussed EKG tracings with Dr. Royann Shivers of cardiology who reviewed all tracings and noted that they did not show concern for atrial fibrillation, but second-degree AV heart block, Mobitz type 1. -Admit to a cardiac telemetry bed -Discontinue heparin drip -Discontinue Cardizem drip as now back in sinus rhythm. -Goal potassium at least 4 and magnesium at least 2.  Replace electrolytes as needed -Check TSH -Check echocardiogram -Follow-up telemetry monitoring overnight -Message sent to Salley Hews to set up for Holter monitor in the outpatient setting  Hypokalemia Acute on chronic.  Initial potassium 3.1.  Patient is on scheduled potassium chloride of 20 meq po daily. -Give potassium chloride 40 meq p.o. x 1 dose -Continue scheduled potassium chloride -Continue to monitor and adjust as needed  Essential hypertension Blood pressures initially elevated up to 170/86.  Home blood pressure regimen includes amlodipine 5 mg daily. -Resume amlodipine tomorrow morning -Hydralazine IV as needed for elevated blood pressures  Diabetes mellitus type 2, without long-term use of insulin On admission glucose 179.  Home medication regimen  includes metformin 500 mg daily.  Last available hemoglobin A1c was 7.8 on 05/2022 -Hypoglycemic protocols -Hold metformin -CBGs before every meal with sensitive SSI  Hypothyroidism Home medication includes levothyroxine 112 mcg daily. -Follow-up TSH(0.017)  -  Decrease levothyroxine to 100 mcg daily -Patient will need new prescription for new dose at discharge.  Should follow-up with primary care provider and have repeat testing 4 to 6 weeks.  Vascular dementia with behavioral disturbance Patient is normally alert and oriented to self, but not time and sometimes confuses him with her husband who passed away over 19 years ago. -Delirium precautions -Bed alarm on -Continue Aricept and Seroquel  Chronic back pain -Continue oxycodone scheduled  History of CVA Patient with chronic infarct of the left lateral cerebellum noted on MRI from 06/2019.  Noted to have intolerance to statins.  Pulmonary nodule Acute.  Chest x-ray noted new pulmonary nodule overlying the lateral right base of the lung.  Patient has remote history of tobacco use -Consider CT scan of the chest  Remote history of tobacco abuse  GERD -Continue PPI  DVT prophylaxis: Transition to Lovenox Advance Care Planning:   Code Status: Full Code    Consults: Cardiology consult was discontinued after review of EKGs  Family Communication: Patient's son updated at bedside  Severity of Illness: The appropriate patient status for this patient is OBSERVATION. Observation status is judged to be reasonable and necessary in order to provide the required intensity of service to ensure the patient's safety. The patient's presenting symptoms, physical exam findings, and initial radiographic and laboratory data in the context of their medical condition is felt to place them at decreased risk for further clinical deterioration. Furthermore, it is anticipated that the patient will be medically stable for discharge from the hospital within 2  midnights of admission.   Author: Clydie Braun, MD 11/14/2022 7:20 AM  For on call review [email or web address].

## 2022-11-14 NOTE — Progress Notes (Signed)
Asked to evaluate for atrial fibrillation. Reviewed the ECG tracings. The rhythm is consistently sinus rhythm, but she does develop rhythm irregularity due to second degree AV block, Mobitz type 1 at rates >100 bpm. Atrial fibrillation is not seen.

## 2022-11-14 NOTE — ED Triage Notes (Signed)
Pt brought from home by EMS for assisted fall. EMS reports patient walking to bathroom when she started feeling weak, patient started to fall when family assisted her to the ground. (-) hit head, (-) LOC, (-) thinners. Sinus tach on monitor on arrival. Pt alert to self  (baseline). No recent sickness per family. EMS states that patient reports lower abdominal pain. Pt denies any abdominal pain at this time but states her right arm is a little sore. Denies N/V/D/fever.

## 2022-11-14 NOTE — Progress Notes (Signed)
ANTICOAGULATION CONSULT NOTE - Initial Consult  Pharmacy Consult for heparin Indication: atrial fibrillation  Allergies  Allergen Reactions   Clindamycin Hcl Other (See Comments)    REACTION: C. difficile colitis   Sulfamethoxazole-Trimethoprim Other (See Comments)     Eye redness   Zanaflex [Tizanidine Hcl] Anaphylaxis   Metronidazole Hives   Statins Other (See Comments)    Side Effect: muscle pain Has tried Zocor, Crestor--she does not want to take a statin, period   Bee Venom Other (See Comments)    Unknown reaction   Ace Inhibitors Cough   Adhesive [Tape] Itching, Rash and Other (See Comments)    Blistering of skin     Patient Measurements: Height: 5\' 4"  (162.6 cm) Weight: 78.7 kg (173 lb 8 oz) IBW/kg (Calculated) : 54.7 Heparin Dosing Weight: 72 Kg  Vital Signs: Temp: 98 F (36.7 C) (07/20 0346) Temp Source: Oral (07/20 0346) BP: 134/84 (07/20 0430) Pulse Rate: 113 (07/20 0515)  Labs: Recent Labs    11/14/22 0055  HGB 12.9  HCT 40.2  PLT 174  CREATININE 0.82  TROPONINIHS 12    Estimated Creatinine Clearance: 54.6 mL/min (by C-G formula based on SCr of 0.82 mg/dL).   Medical History: Past Medical History:  Diagnosis Date   Allergy    Arthritis    Chronic back pain    DDD (degenerative disc disease), lumbar    Diabetes mellitus without complication (HCC)    Diabetes type 2, uncontrolled 09/18/2006   Annotation: Noninsulin dependent Qualifier: Diagnosis of  By: Delrae Alfred MD, Lanora Manis     Former tobacco use    Hyperlipidemia    a. H/o muscle aches with statins per PCP notes.   Hypertension    Hypothyroidism    a. prior thyroid removal.   Memory loss or impairment 03/29/2019   Stroke [facility])    a. Listed in PCP notes: "History of stroke:  History of terrible headache.  Had a CT scan of brain and found "ministroke"  Was removed from ERT subsequently. "    Assessment: 82 yoF brougt from home found to have atrial fibrillation. No oral anticoagulation  reported prior to admission. CBC WNL. Pharmacy consulted to start heparin infusion.    Goal of Therapy:  Heparin level 0.3-0.7 units/ml Monitor platelets by anticoagulation protocol: Yes   Plan:  Give 4000 units bolus x 1 Start heparin infusion at 1050 units/hr Check anti-Xa level in 8 hours and daily while on heparin Continue to monitor H&H and platelets  Ruben Im, PharmD Clinical Pharmacist 11/14/2022 5:27 AM Please check AMION for all [facility] Pharmacy numbers

## 2022-11-15 DIAGNOSIS — Z8249 Family history of ischemic heart disease and other diseases of the circulatory system: Secondary | ICD-10-CM | POA: Diagnosis not present

## 2022-11-15 DIAGNOSIS — E876 Hypokalemia: Secondary | ICD-10-CM | POA: Diagnosis present

## 2022-11-15 DIAGNOSIS — Z833 Family history of diabetes mellitus: Secondary | ICD-10-CM | POA: Diagnosis not present

## 2022-11-15 DIAGNOSIS — Z8673 Personal history of transient ischemic attack (TIA), and cerebral infarction without residual deficits: Secondary | ICD-10-CM | POA: Diagnosis not present

## 2022-11-15 DIAGNOSIS — Y92009 Unspecified place in unspecified non-institutional (private) residence as the place of occurrence of the external cause: Secondary | ICD-10-CM | POA: Diagnosis not present

## 2022-11-15 DIAGNOSIS — Z7984 Long term (current) use of oral hypoglycemic drugs: Secondary | ICD-10-CM | POA: Diagnosis not present

## 2022-11-15 DIAGNOSIS — I4891 Unspecified atrial fibrillation: Secondary | ICD-10-CM | POA: Diagnosis present

## 2022-11-15 DIAGNOSIS — Z79890 Hormone replacement therapy (postmenopausal): Secondary | ICD-10-CM | POA: Diagnosis not present

## 2022-11-15 DIAGNOSIS — R55 Syncope and collapse: Secondary | ICD-10-CM | POA: Diagnosis present

## 2022-11-15 DIAGNOSIS — G8929 Other chronic pain: Secondary | ICD-10-CM | POA: Diagnosis present

## 2022-11-15 DIAGNOSIS — W19XXXA Unspecified fall, initial encounter: Secondary | ICD-10-CM | POA: Diagnosis present

## 2022-11-15 DIAGNOSIS — Z888 Allergy status to other drugs, medicaments and biological substances status: Secondary | ICD-10-CM | POA: Diagnosis not present

## 2022-11-15 DIAGNOSIS — Z87440 Personal history of urinary (tract) infections: Secondary | ICD-10-CM | POA: Diagnosis not present

## 2022-11-15 DIAGNOSIS — I10 Essential (primary) hypertension: Secondary | ICD-10-CM | POA: Diagnosis present

## 2022-11-15 DIAGNOSIS — F01518 Vascular dementia, unspecified severity, with other behavioral disturbance: Secondary | ICD-10-CM | POA: Diagnosis present

## 2022-11-15 DIAGNOSIS — Z882 Allergy status to sulfonamides status: Secondary | ICD-10-CM | POA: Diagnosis not present

## 2022-11-15 DIAGNOSIS — E785 Hyperlipidemia, unspecified: Secondary | ICD-10-CM | POA: Diagnosis present

## 2022-11-15 DIAGNOSIS — Z811 Family history of alcohol abuse and dependence: Secondary | ICD-10-CM | POA: Diagnosis not present

## 2022-11-15 DIAGNOSIS — Z87891 Personal history of nicotine dependence: Secondary | ICD-10-CM | POA: Diagnosis not present

## 2022-11-15 DIAGNOSIS — E890 Postprocedural hypothyroidism: Secondary | ICD-10-CM | POA: Diagnosis present

## 2022-11-15 DIAGNOSIS — E119 Type 2 diabetes mellitus without complications: Secondary | ICD-10-CM | POA: Diagnosis present

## 2022-11-15 DIAGNOSIS — K219 Gastro-esophageal reflux disease without esophagitis: Secondary | ICD-10-CM | POA: Diagnosis present

## 2022-11-15 DIAGNOSIS — R911 Solitary pulmonary nodule: Secondary | ICD-10-CM | POA: Diagnosis present

## 2022-11-15 DIAGNOSIS — Z813 Family history of other psychoactive substance abuse and dependence: Secondary | ICD-10-CM | POA: Diagnosis not present

## 2022-11-15 DIAGNOSIS — M549 Dorsalgia, unspecified: Secondary | ICD-10-CM | POA: Diagnosis present

## 2022-11-15 DIAGNOSIS — I441 Atrioventricular block, second degree: Secondary | ICD-10-CM | POA: Diagnosis present

## 2022-11-15 LAB — GLUCOSE, CAPILLARY
Glucose-Capillary: 148 mg/dL — ABNORMAL HIGH (ref 70–99)
Glucose-Capillary: 161 mg/dL — ABNORMAL HIGH (ref 70–99)
Glucose-Capillary: 141 mg/dL — ABNORMAL HIGH (ref 70–99)
Glucose-Capillary: 178 mg/dL — ABNORMAL HIGH (ref 70–99)

## 2022-11-15 LAB — CBC
HCT: 35.4 % — ABNORMAL LOW (ref 36.0–46.0)
Hemoglobin: 11.8 g/dL — ABNORMAL LOW (ref 12.0–15.0)
MCH: 28.6 pg (ref 26.0–34.0)
MCHC: 33.3 g/dL (ref 30.0–36.0)
MCV: 85.7 fL (ref 80.0–100.0)
Platelets: 166 10*3/uL (ref 150–400)
RBC: 4.13 MIL/uL (ref 3.87–5.11)
RDW: 12.4 % (ref 11.5–15.5)
WBC: 4.9 10*3/uL (ref 4.0–10.5)
nRBC: 0 % (ref 0.0–0.2)

## 2022-11-15 LAB — BASIC METABOLIC PANEL
Anion gap: 7 (ref 5–15)
BUN: 5 mg/dL — ABNORMAL LOW (ref 8–23)
CO2: 22 mmol/L (ref 22–32)
Calcium: 8.7 mg/dL — ABNORMAL LOW (ref 8.9–10.3)
Chloride: 107 mmol/L (ref 98–111)
Creatinine, Ser: 0.63 mg/dL (ref 0.44–1.00)
GFR, Estimated: >60 mL/min (ref >60)
Glucose, Bld: 134 mg/dL — ABNORMAL HIGH (ref 70–99)
Potassium: 3.5 mmol/L (ref 3.5–5.1)
Sodium: 136 mmol/L (ref 135–145)

## 2022-11-15 NOTE — TOC Initial Note (Addendum)
Transition of Care [facility]) - Initial/Assessment Note    Patient Details  Name: Joy Ward MRN: [id number] Date of Birth: 03/03/1941  Transition of Care [facility]) CM/SW Contact:    Ronny Bacon, RN Phone Number: 11/15/2022, 4:24 PM  Clinical Narrative:   In anticipation for possible discharge tomorrow, chart reviewed. HH PT recommended from physical therapy. [facility] contacted to see if can accept patient on service, awaiting response. Attempt made to contact [employer] for DME equipment, no response at this time. TOC will continue to follow for discharge needs and arrangements.       1642: [facility] accepted patient for [facility] PT. Will need orders.          Expected Discharge Plan: Home w Home Health Services Barriers to Discharge: Continued Medical Work up   Patient Goals and CMS Choice            Expected Discharge Plan and Services                                              Prior Living Arrangements/Services   Lives with:: Adult Children          Need for Family Participation in Patient Care: Yes (Comment) Care giver support system in place?: Yes (comment)      Activities of Daily Living Home Assistive Devices/Equipment: None ADL Screening (condition at time of admission) Patient's cognitive ability adequate to safely complete daily activities?: No Is the patient deaf or have difficulty hearing?: No Does the patient have difficulty seeing, even when wearing glasses/contacts?: No Does the patient have difficulty concentrating, remembering, or making decisions?: Yes Patient able to express need for assistance with ADLs?: Yes Does the patient have difficulty dressing or bathing?: No Independently performs ADLs?: Yes (appropriate for developmental age) Does the patient have difficulty walking or climbing stairs?: Yes Weakness of Legs: Both Weakness of Arms/Hands: None  Permission Sought/Granted                  Emotional  Assessment Appearance:: Appears stated age Attitude/Demeanor/Rapport: Engaged Affect (typically observed): Appropriate Orientation: : Oriented to Self Alcohol / Substance Use: Not Applicable Psych Involvement: No (comment)  Admission diagnosis:  Atrial fibrillation (HCC) [I48.91] Atrial fibrillation with RVR (HCC) [I48.91] Patient Active Problem List   Diagnosis Date Noted   Atrial fibrillation (HCC) 11/15/2022   Second degree Mobitz I AV block 11/14/2022   Chronic back pain 11/14/2022   Pulmonary nodule 11/14/2022   History of CVA (cerebrovascular accident) 11/14/2022   Fall at home, initial encounter 11/14/2022   Pain due to onychomycosis of toenails of both feet 01/15/2021   Ataxia 04/07/2020   Weakness generalized 04/07/2020   Weakness 04/07/2020   Carpal tunnel syndrome of left wrist 02/20/2020   Diabetic polyneuropathy associated with type 2 diabetes mellitus (HCC) 02/20/2020   Lower GI bleed 07/31/2019   Syncope 07/30/2019   Pancytopenia (HCC) 07/30/2019   Hyperchloremia 07/30/2019   Metabolic acidosis 07/30/2019   Hypocalcemia 07/30/2019   Palliative care patient 07/03/2019   Hallucinations 06/24/2019   Metabolic encephalopathy 06/24/2019   Altered mental status    Recurrent UTI 04/30/2019   Vascular dementia with behavior disturbance (HCC) 04/14/2019   Primary insomnia 04/14/2019   Hypothyroidism 04/14/2019   Diabetes mellitus (HCC) 04/14/2019   Memory loss or impairment 03/29/2019   Diabetic peripheral neuropathy associated with type 2 diabetes mellitus (  HCC) 12/29/2017   Peripheral edema 12/29/2017   Elevated liver enzymes 10/15/2016   Chest pain 04/22/2016   Systolic murmur 04/22/2016   Hypercalcemia 04/22/2016   Hyperlipidemia 04/22/2016   Baker's cyst of knee, right 04/21/2016   HYPOKALEMIA 01/24/2010   CONSTIPATION 12/23/2009   INTERMITTENT VERTIGO 12/23/2009   SINUSITIS, ACUTE 04/25/2009   VAGINITIS, CANDIDAL 02/14/2009   HIP PAIN, RIGHT 02/14/2009    HYPERLIPIDEMIA, MIXED 01/01/2009   BACK PAIN, LUMBAR, WITH RADICULOPATHY 12/25/2008   KNEE PAIN, BILATERAL 12/18/2008   TROCHANTERIC BURSITIS, BILATERAL 12/18/2008   Disturbance in sleep behavior 12/18/2008   ONYCHOMYCOSIS, TOENAILS 10/30/2008   ENTERITIS, CLOSTRIDIUM DIFFICILE 10/15/2006   Diabetes type 2, uncontrolled 09/18/2006   Essential hypertension 09/18/2006   Allergic rhinitis 09/18/2006   GOITER, MULTINODULAR 06/29/2006   STROKE 04/27/2001   PCP:  Octavia Heir, NP Pharmacy:   [facility] [place], [place] - [address] [place] [address] Phone: [phone] Fax: [phone]     Social Determinants of Health (SDOH) Social History: SDOH Screenings   Food Insecurity: Patient Declined (11/14/2022)  Housing: High Risk (11/14/2022)  Transportation Needs: Patient Declined (11/14/2022)  Utilities: Not At Risk (11/14/2022)  Depression (PHQ2-9): Low Risk  (06/12/2021)  Tobacco Use: Medium Risk (11/14/2022)   SDOH Interventions: Food Insecurity Interventions: Intervention Not Indicated Utilities Interventions: Intervention Not Indicated   Readmission Risk Interventions     No data to display

## 2022-11-15 NOTE — Plan of Care (Signed)
  Problem: Fluid Volume: Goal: Ability to maintain a balanced intake and output will improve Outcome: Progressing   Problem: Nutritional: Goal: Maintenance of adequate nutrition will improve Outcome: Progressing Goal: Progress toward achieving an optimal weight will improve Outcome: Progressing   Problem: Skin Integrity: Goal: Risk for impaired skin integrity will decrease Outcome: Progressing   Problem: Clinical Measurements: Goal: Ability to maintain clinical measurements within normal limits will improve Outcome: Progressing Goal: Will remain free from infection Outcome: Progressing Goal: Diagnostic test results will improve Outcome: Progressing Goal: Respiratory complications will improve Outcome: Progressing Goal: Cardiovascular complication will be avoided Outcome: Progressing   Problem: Nutrition: Goal: Adequate nutrition will be maintained Outcome: Progressing   Problem: Pain Managment: Goal: General experience of comfort will improve Outcome: Progressing   Problem: Safety: Goal: Ability to remain free from injury will improve Outcome: Progressing

## 2022-11-15 NOTE — Evaluation (Signed)
Physical Therapy Evaluation Patient Details Name: Joy Ward MRN: [id number] DOB: 1941/02/13 Today's Date: 11/15/2022  History of Present Illness  Pt is an 82 y.o. female who presented 11/14/22 s/p fall and near syncopal event. EKG showed concern for second-degree AV heart block. Chest x-ray noted pulmonary nodule overlying lateral right base of lung. PMH: dementia, CVA, DM2, HTN, HLD   Clinical Impression  Pt presents with condition above and deficits mentioned below, see PT Problem List. PTA, she was mod I, intermittently holding onto furniture, for functional mobility, but requiring guarding on stairs along with assistance/supervision for ADLs/iADLs. She resides with her daughter and son in a 1-level apartment with x1 flight of stairs to access without an elevator option. Pt has cognitive deficits at baseline with a hx of dementia. At this time, she is only oriented to her name, but not her DOB. Currently, pt is requiring min guard assist to ambulate with a RW but up to minA to prevent LOB when trying to ambulate without UE support. She displays deficits in strength, balance, and activity tolerance and is at risk for subsequent falls. Recommending follow-up with HHPT at d/c. Will continue to follow acutely.    Vitals:  148/82 (101) & 81 bpm supine 151/90 (108) & 88 bpm sitting 136/94 (106) & 104 bpm standing 156/83 (105) supine end of session  *denied lightheadedness and dizziness throughout      Assistance Recommended at Discharge Frequent or constant Supervision/Assistance  If plan is discharge home, recommend the following:  Can travel by private vehicle  A little help with walking and/or transfers;A little help with bathing/dressing/bathroom;Assistance with cooking/housework;Direct supervision/assist for medications management;Direct supervision/assist for financial management;Assist for transportation;Help with stairs or ramp for entrance        Equipment Recommendations  Rolling walker (2 wheels);BSC/3in1  Recommendations for Other Services       Functional Status Assessment Patient has had a recent decline in their functional status and demonstrates the ability to make significant improvements in function in a reasonable and predictable amount of time.     Precautions / Restrictions Precautions Precautions: Fall Restrictions Weight Bearing Restrictions: No      Mobility  Bed Mobility Overal bed mobility: Needs Assistance Bed Mobility: Supine to Sit, Sit to Supine     Supine to sit: Supervision, HOB elevated Sit to supine: Supervision, HOB elevated   General bed mobility comments: Supervision for safety, HOB elevated    Transfers Overall transfer level: Needs assistance Equipment used: Rolling walker (2 wheels), None Transfers: Sit to/from Stand Sit to Stand: Min guard           General transfer comment: Extra time to transfer to stand, 1x from EOB to RW and 1x from commode with use of R grab bar without AD, min guard for safety    Ambulation/Gait Ambulation/Gait assistance: Min guard, Min assist Gait Distance (Feet): 90 Feet (x2 bouts of ~90 ft > ~20 ft) Assistive device: Rolling walker (2 wheels), None Gait Pattern/deviations: Step-through pattern, Decreased stride length, Trunk flexed, Drifts right/left Gait velocity: reduced Gait velocity interpretation: <1.8 ft/sec, indicate of risk for recurrent falls   General Gait Details: Pt ambulates smoothly when utilizing a RW, no LOB, min guard for safety. When ambulating without UE support, she displays a tendency to drift/sway and need minA to prevent LOB intermittently.  Stairs            Wheelchair Mobility     Tilt Bed    Modified Rankin (Stroke Patients Only)  Balance Overall balance assessment: Mild deficits observed, not formally tested                                           Pertinent Vitals/Pain Pain Assessment Pain Assessment:  Faces Faces Pain Scale: No hurt Pain Intervention(s): Monitored during session    Home Living Family/patient expects to be discharged to:: Private residence Living Arrangements: Children (son and daughter) Available Help at Discharge: Family;Available 24 hours/day Type of Home: Apartment Home Access: Stairs to enter Entrance Stairs-Rails: Right Entrance Stairs-Number of Steps: 12   Home Layout: One level Home Equipment: None      Prior Function Prior Level of Function : Needs assist             Mobility Comments: +2 guarding on stairs; ambulates mod I - holding onto furniture intermittently ADLs Comments: Supervision for showers with intermittent assistance; family manage meds, finances, transportation, meals, and cleaning     Hand Dominance        Extremity/Trunk Assessment   Upper Extremity Assessment Upper Extremity Assessment: Overall WFL for tasks assessed    Lower Extremity Assessment Lower Extremity Assessment: Generalized weakness       Communication   Communication: No difficulties  Cognition Arousal/Alertness: Awake/alert Behavior During Therapy: WFL for tasks assessed/performed Overall Cognitive Status: History of cognitive impairments - at baseline                                 General Comments: Hx of dementia. Pt oriented to her own name but not to her DOB. Oriented to the fact that she is at a hospital but unsure of which one. Pt recalls being told she had a fall but does not recall the fall herself. Follows simple cues with extra time.        General Comments General comments (skin integrity, edema, etc.): Vitals: 148/82 (101) & 81 bpm supine, 151/90 (108) & 88 bpm sitting, 136/94 (106) & 104 bpm standing, 156/83 (105) supine end of session; denied lightheadedness and dizziness throughout    Exercises     Assessment/Plan    PT Assessment Patient needs continued PT services  PT Problem List Decreased strength;Decreased  activity tolerance;Decreased mobility;Decreased balance;Decreased cognition;Decreased safety awareness       PT Treatment Interventions DME instruction;Gait training;Stair training;Functional mobility training;Therapeutic activities;Therapeutic exercise;Balance training;Neuromuscular re-education;Patient/family education;Cognitive remediation    PT Goals (Current goals can be found in the Care Plan section)  Acute Rehab PT Goals Patient Stated Goal: to go home PT Goal Formulation: With patient/family Time For Goal Achievement: 11/29/22 Potential to Achieve Goals: Good    Frequency Min 1X/week     Co-evaluation               AM-PAC PT "6 Clicks" Mobility  Outcome Measure Help needed turning from your back to your side while in a flat bed without using bedrails?: A Little Help needed moving from lying on your back to sitting on the side of a flat bed without using bedrails?: A Little Help needed moving to and from a bed to a chair (including a wheelchair)?: A Little Help needed standing up from a chair using your arms (e.g., wheelchair or bedside chair)?: A Little Help needed to walk in hospital room?: A Little Help needed climbing 3-5 steps with a railing? : A  Little 6 Click Score: 18    End of Session Equipment Utilized During Treatment: Gait belt Activity Tolerance: Patient tolerated treatment well Patient left: in bed;with call bell/phone within reach;with bed alarm set;with family/visitor present   PT Visit Diagnosis: Unsteadiness on feet (R26.81);Other abnormalities of gait and mobility (R26.89);Muscle weakness (generalized) (M62.81);History of falling (Z91.81);Difficulty in walking, not elsewhere classified (R26.2)    Time: 2440-1027 PT Time Calculation (min) (ACUTE ONLY): 26 min   Charges:   PT Evaluation $PT Eval Moderate Complexity: 1 Mod PT Treatments $Therapeutic Activity: 8-22 mins PT General Charges $$ ACUTE PT VISIT: 1 Visit         Raymond Gurney, PT, DPT Acute Rehabilitation Services  Office: [phone]   Jewel Baize 11/15/2022, 3:58 PM

## 2022-11-15 NOTE — Progress Notes (Signed)
PROGRESS NOTE    Joy Ward  AOZ:[id number] DOB: 29-Mar-1941 DOA: 11/14/2022 PCP: Octavia Heir, NP   Brief Narrative:  This 82 y.o. female with medical history significant of hypertension, hyperlipidemia,  unable to tolerate statins, prior CVA, diabetes mellitus type 2, postsurgical hypothyroidism, dementia, and remote history of tobacco use who presents after having a fall.  Patient has baseline dementia.  At baseline she ambulates without assistance.  Last night she had gotten up and was walking started feeling weak and fell family was nearby and was able to get her to the ground.  Son reports her eyes rolled back in her head.  There was no head trauma or loss of consciousness.  She had been in her normal state of health eating and drinking like normal.  In the ED she was found to be in A-fib with heart rate in 130s.  Pertinent labs include potassium 3.1, high-sensitivity troponin 12.  Chest x-ray noted pulmonary nodule overlying lateral right base of lung.  Patient was given IV fluid resuscitation and started on Cardizem and heparin drips.  Patient is admitted for further evaluation.  Assessment & Plan:   Principal Problem:   Fall at home, initial encounter Active Problems:   Second degree Mobitz I AV block   HYPOKALEMIA   Essential hypertension   Diabetes mellitus (HCC)   Hypothyroidism   Vascular dementia with behavior disturbance (HCC)   Chronic back pain   History of CVA (cerebrovascular accident)   Pulmonary nodule  S/P Fall: Second-degree AV block: Patient reported to have near syncopal event with initial EKG thought to be atrial fibrillation with heart rates elevated into the 130s.  She had been started on a Cardizem and heparin drip .  EKG reviewed by cardiology who has reviewed all the tracings and did not have any concerns about atrial fibrillation but stated patient has second-degree AV block Mobitz type I.  -Discontinued heparin drip -Discontinued Cardizem drip as now  back in sinus rhythm. -Potassium replaced.  Continue to monitor. -TSH 0.017 -Check echocardiogram -Follow-up telemetry monitoring overnight -Cardiology consulted.  Formal consult pending.   Hypokalemia: Acute on Chronic. Patient is on scheduled potassium chloride of 20 meq po daily. Replaced.  Continue to monitor.   Essential hypertension: Blood pressures initially elevated up to 170/86.   -Continue amlodipine 5 mg daily -Hydralazine IV as needed for elevated blood pressures   Diabetes mellitus type 2, without long-term use of insulin Home medication regimen includes metformin 500 mg daily.  Last available hemoglobin A1c was 7.8 on 05/2022 -Hypoglycemic protocols -Hold metformin -CBGs before every meal with sensitive SSI.   Hypothyroidism: Home medication includes levothyroxine 112 mcg daily. -Follow-up TSH(0.017)  -Decrease levothyroxine to 100 mcg daily -Patient will need new prescription for new dose at discharge.   Should follow-up with primary care provider and have repeat testing 4 to 6 weeks.   Vascular dementia with behavioral disturbance: Patient has baseline dementia. -Delirium precautions -Bed alarm on -Continue Aricept and Seroquel.   Chronic back pain: -Continue oxycodone scheduled   History of CVA: Patient with chronic infarct of the left lateral cerebellum noted on MRI from 06/2019.  Noted to have intolerance to statins.   Pulmonary nodule Chest x-ray noted new pulmonary nodule overlying the lateral right base of the lung.  Patient has remote history of tobacco use. -Consider CT scan of the chest   Remote history of tobacco abuse:   GERD -Continue PPI   DVT prophylaxis: Lovenox Code Status: Full code Family  Communication:No family at bed side. Disposition Plan:    Status is: Observation The patient remains OBS appropriate and will d/c before 2 midnights.   Admitted for fall, found to have tachycardia initial EKG tracing shows A-fib reviewed with  cardiology patient has second-degree AV block Mobitz type I.  Needs PT and OT evaluation for disposition.  Consultants:  Cardiology  Procedures: Echocardiogram  Antimicrobials:  Anti-infectives (From admission, onward)    None       Subjective: Patient was seen and examined at bedside.  Overnight events noted. Patient denies any chest pain or shortness of breath.  Patient reports having severe weakness.  Objective: Vitals:   11/15/22 0314 11/15/22 0835 11/15/22 1156 11/15/22 1200  BP: 128/70 (!) 152/102 (!) 158/82 (!) 158/82  Pulse: 83 (!) 104  82  Resp: 19   (!) 23  Temp: 98.6 F (37 C) 97.6 F (36.4 C) 97.6 F (36.4 C)   TempSrc: Oral Oral Oral   SpO2: 96% 95%  96%  Weight:      Height:        Intake/Output Summary (Last 24 hours) at 11/15/2022 1422 Last data filed at 11/15/2022 0600 Gross per 24 hour  Intake 183.13 ml  Output 110 ml  Net 73.13 ml   Filed Weights   11/14/22 0058  Weight: 78.7 kg    Examination:  General exam: Appears calm and comfortable, deconditioned, not in any acute distress. Respiratory system: CTA Bilaterally. Respiratory effort normal. RR 14 Cardiovascular system: S1 & S2 heard, RRR. No JVD, murmurs, rubs, gallops or clicks.  Gastrointestinal system: Abdomen is non distended, soft and non tender. Normal bowel sounds heard. Central nervous system: Alert and oriented x 3. No focal neurological deficits. Extremities: No edema , no cyanosis, No clubbing. Skin: No rashes, lesions or ulcers Psychiatry: Judgement and insight appear normal. Mood & affect appropriate.     Data Reviewed: I have personally reviewed following labs and imaging studies  CBC: Recent Labs  Lab 11/14/22 0055 11/15/22 0633  WBC 8.7 4.9  NEUTROABS 6.2  --   HGB 12.9 11.8*  HCT 40.2 35.4*  MCV 87.8 85.7  PLT 174 166   Basic Metabolic Panel: Recent Labs  Lab 11/14/22 0055 11/15/22 0633  NA 138 136  K 3.1* 3.5  CL 103 107  CO2 23 22  GLUCOSE 179*  134*  BUN 9 5*  CREATININE 0.82 0.63  CALCIUM 9.5 8.7*  MG 1.9  --    GFR: Estimated Creatinine Clearance: 56 mL/min (by C-G formula based on SCr of 0.63 mg/dL). Liver Function Tests: No results for input(s): "AST", "ALT", "ALKPHOS", "BILITOT", "PROT", "ALBUMIN" in the last 168 hours. No results for input(s): "LIPASE", "AMYLASE" in the last 168 hours. No results for input(s): "AMMONIA" in the last 168 hours. Coagulation Profile: No results for input(s): "INR", "PROTIME" in the last 168 hours. Cardiac Enzymes: No results for input(s): "CKTOTAL", "CKMB", "CKMBINDEX", "TROPONINI" in the last 168 hours. BNP (last 3 results) No results for input(s): "PROBNP" in the last 8760 hours. HbA1C: Recent Labs    11/14/22 1610  HGBA1C 7.3*   CBG: Recent Labs  Lab 11/14/22 1550 11/14/22 2100 11/14/22 2225 11/15/22 0717 11/15/22 1201  GLUCAP 146* 236* 225* 148* 161*   Lipid Profile: No results for input(s): "CHOL", "HDL", "LDLCALC", "TRIG", "CHOLHDL", "LDLDIRECT" in the last 72 hours. Thyroid Function Tests: Recent Labs    11/14/22 0055  TSH 0.017*   Anemia Panel: No results for input(s): "VITAMINB12", "FOLATE", "FERRITIN", "TIBC", "  IRON", "RETICCTPCT" in the last 72 hours. Sepsis Labs: No results for input(s): "PROCALCITON", "LATICACIDVEN" in the last 168 hours.  No results found for this or any previous visit (from the past 240 hour(s)).   Radiology Studies: DG Chest Port 1 View  Result Date: 11/14/2022 CLINICAL DATA:  Fall. EXAM: PORTABLE CHEST 1 VIEW COMPARISON:  10/20/2020 FINDINGS: The lungs are clear without focal pneumonia, edema, pneumothorax or pleural effusion. Pulmonary nodule overlying the lateral right base is new in the interval. Cardiopericardial silhouette is at upper limits of normal for size. No acute bony abnormality. Telemetry leads overlie the chest. IMPRESSION: 1. No acute cardiopulmonary findings. 2. New pulmonary nodule overlying the lateral right base. CT  chest without contrast recommended to further evaluate. Electronically Signed   By: Kennith Center M.D.   On: 11/14/2022 06:03    Scheduled Meds:  amLODipine  5 mg Oral Daily   diclofenac Sodium  2 g Topical TID   donepezil  5 mg Oral Q supper   enoxaparin (LOVENOX) injection  40 mg Subcutaneous Q24H   famotidine  40 mg Oral Daily   insulin aspart  0-9 Units Subcutaneous TID WC   latanoprost  1 drop Both Eyes QHS   levothyroxine  100 mcg Oral Q0600   loratadine  10 mg Oral Daily   oxyCODONE  5 mg Oral TID   polyethylene glycol  17 g Oral BID   potassium chloride SA  20 mEq Oral QPC lunch   QUEtiapine  100 mg Oral QPC supper   sodium chloride flush  3 mL Intravenous Q12H   traZODone  100 mg Oral QHS   Continuous Infusions:   LOS: 0 days    Time spent: 50 mins    Willeen Niece, MD [facility]   If 7PM-7AM, please contact night-coverage

## 2022-11-15 NOTE — Care Management Obs Status (Signed)
MEDICARE OBSERVATION STATUS NOTIFICATION   Patient Details  Name: Joy Ward MRN: [id number] Date of Birth: 1941/03/04   Medicare Observation Status Notification Given:  Yes    Ronny Bacon, RN 11/15/2022, 2:14 PM

## 2022-11-16 ENCOUNTER — Telehealth: Payer: Self-pay | Admitting: *Deleted

## 2022-11-16 DIAGNOSIS — W19XXXA Unspecified fall, initial encounter: Secondary | ICD-10-CM | POA: Diagnosis not present

## 2022-11-16 DIAGNOSIS — Y92009 Unspecified place in unspecified non-institutional (private) residence as the place of occurrence of the external cause: Secondary | ICD-10-CM | POA: Diagnosis not present

## 2022-11-16 DIAGNOSIS — I4891 Unspecified atrial fibrillation: Secondary | ICD-10-CM | POA: Diagnosis not present

## 2022-11-16 LAB — GLUCOSE, CAPILLARY
Glucose-Capillary: 131 mg/dL — ABNORMAL HIGH (ref 70–99)
Glucose-Capillary: 159 mg/dL — ABNORMAL HIGH (ref 70–99)
Glucose-Capillary: 157 mg/dL — ABNORMAL HIGH (ref 70–99)
Glucose-Capillary: 127 mg/dL — ABNORMAL HIGH (ref 70–99)

## 2022-11-16 LAB — CBC
HCT: 38.8 % (ref 36.0–46.0)
Hemoglobin: 12.4 g/dL (ref 12.0–15.0)
MCH: 29.1 pg (ref 26.0–34.0)
MCHC: 32 g/dL (ref 30.0–36.0)
MCV: 91.1 fL (ref 80.0–100.0)
Platelets: 125 10*3/uL — ABNORMAL LOW (ref 150–400)
RBC: 4.26 MIL/uL (ref 3.87–5.11)
RDW: 12.4 % (ref 11.5–15.5)
WBC: 4.1 10*3/uL (ref 4.0–10.5)
nRBC: 0 % (ref 0.0–0.2)

## 2022-11-16 NOTE — Progress Notes (Signed)
Physical Therapy Treatment Patient Details Name: Joy Ward MRN: [id number] DOB: February 02, 1941 Today's Date: 11/16/2022   History of Present Illness Pt is an 82 y.o. female who presented 11/14/22 s/p fall and near syncopal event. EKG showed concern for second-degree AV heart block. Chest x-ray noted pulmonary nodule overlying lateral right base of lung. PMH: dementia, CVA, DM2, HTN, HLD    PT Comments  Pt initially resistant to OOB mobility, but agreeable with PT and daughter encouragement. Pt mobilizing at contact guard level at this time, ambulated good hallway distance without AD.  Pt's family states she is close to baseline at this time, HR 80s upon return to room after hallway gait. PT to continue to follow.      Assistance Recommended at Discharge Frequent or constant Supervision/Assistance  If plan is discharge home, recommend the following:  Can travel by private vehicle    A little help with walking and/or transfers;A little help with bathing/dressing/bathroom;Assistance with cooking/housework;Direct supervision/assist for medications management;Direct supervision/assist for financial management;Assist for transportation;Help with stairs or ramp for entrance      Equipment Recommendations  Rolling walker (2 wheels);BSC/3in1    Recommendations for Other Services       Precautions / Restrictions Precautions Precautions: Fall Restrictions Weight Bearing Restrictions: No     Mobility  Bed Mobility Overal bed mobility: Needs Assistance Bed Mobility: Supine to Sit, Sit to Supine     Supine to sit: Supervision, HOB elevated Sit to supine: Supervision, HOB elevated        Transfers Overall transfer level: Needs assistance Equipment used: None Transfers: Sit to/from Stand Sit to Stand: Min guard           General transfer comment: for safety, slow to rise. stand x3, from EOB x2 and toilet x1    Ambulation/Gait Ambulation/Gait assistance: Min guard Gait  Distance (Feet): 120 Feet Assistive device: None Gait Pattern/deviations: Step-through pattern, Decreased stride length, Trunk flexed, Drifts right/left Gait velocity: decr     General Gait Details: PT providing verbal cues for hallway navigation, finding way back to room as pt with no directional abilities this date   Stairs             Wheelchair Mobility     Tilt Bed    Modified Rankin (Stroke Patients Only)       Balance Overall balance assessment: Mild deficits observed, not formally tested                                          Cognition Arousal/Alertness: Awake/alert Behavior During Therapy: WFL for tasks assessed/performed Overall Cognitive Status: History of cognitive impairments - at baseline                                 General Comments: history of dementia, endorses one of her daughters is her sister. Pt is self-limiting, but participates well with encouragement        Exercises      General Comments General comments (skin integrity, edema, etc.): HR 80s upon return to room, telemetry box turned off when unplugged from central monitor in room      Pertinent Vitals/Pain Pain Assessment Pain Assessment: Faces Faces Pain Scale: Hurts a little bit Pain Location: back Pain Intervention(s): Monitored during session    Home Living  Prior Function            PT Goals (current goals can now be found in the care plan section) Acute Rehab PT Goals Patient Stated Goal: to go home PT Goal Formulation: With patient/family Time For Goal Achievement: 11/29/22 Potential to Achieve Goals: Good Progress towards PT goals: Progressing toward goals    Frequency    Min 1X/week      PT Plan Current plan remains appropriate    Co-evaluation              AM-PAC PT "6 Clicks" Mobility   Outcome Measure  Help needed turning from your back to your side while in a flat bed  without using bedrails?: A Little Help needed moving from lying on your back to sitting on the side of a flat bed without using bedrails?: A Little Help needed moving to and from a bed to a chair (including a wheelchair)?: A Little Help needed standing up from a chair using your arms (e.g., wheelchair or bedside chair)?: A Little Help needed to walk in hospital room?: A Little Help needed climbing 3-5 steps with a railing? : A Little 6 Click Score: 18    End of Session Equipment Utilized During Treatment: Gait belt Activity Tolerance: Patient tolerated treatment well Patient left: in bed;with call bell/phone within reach;with bed alarm set;with family/visitor present Nurse Communication: Mobility status PT Visit Diagnosis: Unsteadiness on feet (R26.81);Other abnormalities of gait and mobility (R26.89);Muscle weakness (generalized) (M62.81);History of falling (Z91.81);Difficulty in walking, not elsewhere classified (R26.2)     Time: 4332-9518 PT Time Calculation (min) (ACUTE ONLY): 36 min  Charges:    $Therapeutic Activity: 23-37 mins PT General Charges $$ ACUTE PT VISIT: 1 Visit                     Marye Round, PT DPT Acute Rehabilitation Services Secure Chat Preferred  Office [phone]    Simranjit Thayer Sheliah Plane 11/16/2022, 4:41 PM

## 2022-11-16 NOTE — Plan of Care (Signed)
  Problem: Education: Goal: Ability to describe self-care measures that may prevent or decrease complications (Diabetes Survival Skills Education) will improve Outcome: Progressing Goal: Individualized Educational Video(s) Outcome: Progressing   Problem: Coping: Goal: Ability to adjust to condition or change in health will improve Outcome: Progressing   Problem: Fluid Volume: Goal: Ability to maintain a balanced intake and output will improve Outcome: Progressing   Problem: Health Behavior/Discharge Planning: Goal: Ability to identify and utilize available resources and services will improve Outcome: Progressing Goal: Ability to manage health-related needs will improve Outcome: Progressing   Problem: Metabolic: Goal: Ability to maintain appropriate glucose levels will improve Outcome: Progressing   Problem: Nutritional: Goal: Maintenance of adequate nutrition will improve Outcome: Progressing Goal: Progress toward achieving an optimal weight will improve Outcome: Progressing   Problem: Skin Integrity: Goal: Risk for impaired skin integrity will decrease Outcome: Progressing   Problem: Tissue Perfusion: Goal: Adequacy of tissue perfusion will improve Outcome: Progressing   Problem: Safety: Goal: Non-violent Restraint(s) Outcome: Progressing   Problem: Education: Goal: Knowledge of General Education information will improve Description: Including pain rating scale, medication(s)/side effects and non-pharmacologic comfort measures Outcome: Progressing   Problem: Health Behavior/Discharge Planning: Goal: Ability to manage health-related needs will improve Outcome: Progressing   Problem: Clinical Measurements: Goal: Ability to maintain clinical measurements within normal limits will improve Outcome: Progressing Goal: Will remain free from infection Outcome: Progressing Goal: Diagnostic test results will improve Outcome: Progressing Goal: Respiratory complications  will improve Outcome: Progressing Goal: Cardiovascular complication will be avoided Outcome: Progressing   Problem: Activity: Goal: Risk for activity intolerance will decrease Outcome: Progressing   Problem: Nutrition: Goal: Adequate nutrition will be maintained Outcome: Progressing   Problem: Coping: Goal: Level of anxiety will decrease Outcome: Progressing   Problem: Elimination: Goal: Will not experience complications related to bowel motility Outcome: Progressing Goal: Will not experience complications related to urinary retention Outcome: Progressing   Problem: Pain Managment: Goal: General experience of comfort will improve Outcome: Progressing   Problem: Safety: Goal: Ability to remain free from injury will improve Outcome: Progressing   Problem: Skin Integrity: Goal: Risk for impaired skin integrity will decrease Outcome: Progressing   

## 2022-11-16 NOTE — Progress Notes (Signed)
PROGRESS NOTE    Joy Ward  ZOX:[id number] DOB: Nov 01, 1940 DOA: 11/14/2022  PCP: Octavia Heir, NP   Brief Narrative:  This 81 y.o. female with medical history significant of hypertension, hyperlipidemia,  unable to tolerate statins, prior CVA, diabetes mellitus type 2, postsurgical hypothyroidism, dementia, and remote history of tobacco use who presents after having a fall.  Patient has baseline dementia.  At baseline she ambulates without assistance.  Last night she had gotten up and was walking started feeling weak and fell family was nearby and was able to get her to the ground.  Son reports her eyes rolled back in her head.  There was no head trauma or loss of consciousness.  She had been in her normal state of health eating and drinking like normal.  In the ED she was found to be in A-fib with heart rate in 130s.  Pertinent labs include potassium 3.1, high-sensitivity troponin 12.  Chest x-ray noted pulmonary nodule overlying lateral right base of lung.  Patient was given IV fluid resuscitation and started on Cardizem and heparin drips.  Patient is admitted for further evaluation.  Assessment & Plan:   Principal Problem:   Fall at home, initial encounter Active Problems:   Second degree Mobitz I AV block   HYPOKALEMIA   Essential hypertension   Diabetes mellitus (HCC)   Hypothyroidism   Vascular dementia with behavior disturbance (HCC)   Chronic back pain   History of CVA (cerebrovascular accident)   Pulmonary nodule   Atrial fibrillation (HCC)  S/P Fall: Second-degree AV block: Patient presented with near syncopal event with initial EKG thought to be atrial fibrillation with heart rates elevated into the 130s.   She had been started on a Cardizem and heparin drip .   EKG reviewed by cardiology who has reviewed all the tracings and did not have any concerns about atrial fibrillation. Cardiologist states patient has second-degree AV block Mobitz type I.  -Discontinued heparin  drip -Discontinued Cardizem drip as now back in sinus rhythm. -Potassium replaced.  Continue to monitor. -TSH 0.017 -Echo showed LVEF 65-70%. No RWMA. -Follow-up telemetry monitoring overnight -Cardiology consulted.  Formal consult pending.   Hypokalemia: Acute on Chronic. Patient is on scheduled potassium chloride of 20 meq po daily. Replaced.  Continue to monitor.   Essential hypertension: Blood pressures initially elevated up to 170/86.   Continue amlodipine 5 mg daily Continue Hydralazine IV as needed for elevated blood pressures   Diabetes mellitus type 2, without long-term use of insulin Home medication regimen includes metformin 500 mg daily.  Last available hemoglobin A1c was 7.8 on 05/2022 -Hypoglycemic protocols -Hold metformin -CBGs before every meal with sensitive SSI.   Hypothyroidism: Home medication includes levothyroxine 112 mcg daily. -Follow-up TSH(0.017)  -Decrease levothyroxine to 100 mcg daily -Patient will need new prescription for new dose at discharge.   - Should follow-up with primary care provider and have repeat testing 4 to 6 weeks.   Vascular dementia with behavioral disturbance: Patient has baseline dementia. Continue Delirium precautions. Bed alarm on Continue Aricept and Seroquel.   Chronic back pain: Continue oxycodone scheduled   History of CVA: Patient with chronic infarct of the left lateral cerebellum noted on MRI from 06/2019.  Noted to have intolerance to statins.   Pulmonary nodule Chest x-ray noted new pulmonary nodule overlying the lateral right base of the lung.  Patient has remote history of tobacco use. -Consider CT scan of the chest   Remote history of tobacco abuse:  GERD -Continue PPI   DVT prophylaxis: Lovenox Code Status: Full code Family Communication:No family at bed side. Disposition Plan:   Status is: Inpatient Remains inpatient appropriate because:    Admitted for fall, found to have tachycardia initial  EKG tracing shows A-fib reviewed with cardiology patient has second-degree AV block Mobitz type I.  Needs PT and OT evaluation for disposition.  Consultants:  Cardiology  Procedures: Echocardiogram  Antimicrobials:  Anti-infectives (From admission, onward)    None       Subjective: Patient was seen and examined at bedside.  Overnight events noted. Patient denies any chest pain or shortness of breath.   Patient reports  severe weakness otherwise doing much better.  Objective: Vitals:   11/15/22 2035 11/16/22 0521 11/16/22 0742 11/16/22 1155  BP: (!) 164/78 (!) 146/86 (!) 148/93 (!) 145/63  Pulse: 86 77 76 74  Resp: 18 16 20 18   Temp: 98.2 F (36.8 C) 98.3 F (36.8 C) 98.6 F (37 C) 98 F (36.7 C)  TempSrc: Oral Oral Oral Oral  SpO2: 100% 94% 95% 100%  Weight:      Height:        Intake/Output Summary (Last 24 hours) at 11/16/2022 1223 Last data filed at 11/16/2022 0526 Gross per 24 hour  Intake 240 ml  Output 500 ml  Net -260 ml   Filed Weights   11/14/22 0058  Weight: 78.7 kg    Examination:  General exam: Appears calm and comfortable, deconditioned, not in any distress. Respiratory system: CTA Bilaterally. Respiratory effort normal. RR 13 Cardiovascular system: S1 & S2 heard, regular rate and rhythm, no murmur. Gastrointestinal system: Abdomen is non distended, soft and non tender. Normal bowel sounds heard. Central nervous system: Alert and oriented x 3. No focal neurological deficits. Extremities: No edema , no cyanosis, No clubbing. Skin: No rashes, lesions or ulcers Psychiatry: Judgement and insight appear normal. Mood & affect appropriate.     Data Reviewed: I have personally reviewed following labs and imaging studies  CBC: Recent Labs  Lab 11/14/22 0055 11/15/22 0633 11/16/22 0412  WBC 8.7 4.9 4.1  NEUTROABS 6.2  --   --   HGB 12.9 11.8* 12.4  HCT 40.2 35.4* 38.8  MCV 87.8 85.7 91.1  PLT 174 166 125*   Basic Metabolic Panel: Recent  Labs  Lab 11/14/22 0055 11/15/22 0633  NA 138 136  K 3.1* 3.5  CL 103 107  CO2 23 22  GLUCOSE 179* 134*  BUN 9 5*  CREATININE 0.82 0.63  CALCIUM 9.5 8.7*  MG 1.9  --    GFR: Estimated Creatinine Clearance: 56 mL/min (by C-G formula based on SCr of 0.63 mg/dL). Liver Function Tests: No results for input(s): "AST", "ALT", "ALKPHOS", "BILITOT", "PROT", "ALBUMIN" in the last 168 hours. No results for input(s): "LIPASE", "AMYLASE" in the last 168 hours. No results for input(s): "AMMONIA" in the last 168 hours. Coagulation Profile: No results for input(s): "INR", "PROTIME" in the last 168 hours. Cardiac Enzymes: No results for input(s): "CKTOTAL", "CKMB", "CKMBINDEX", "TROPONINI" in the last 168 hours. BNP (last 3 results) No results for input(s): "PROBNP" in the last 8760 hours. HbA1C: Recent Labs    11/14/22 1610  HGBA1C 7.3*   CBG: Recent Labs  Lab 11/15/22 1201 11/15/22 1636 11/15/22 2140 11/16/22 0740 11/16/22 1154  GLUCAP 161* 141* 178* 131* 159*   Lipid Profile: No results for input(s): "CHOL", "HDL", "LDLCALC", "TRIG", "CHOLHDL", "LDLDIRECT" in the last 72 hours. Thyroid Function Tests: Recent Labs  11/14/22 0055  TSH 0.017*   Anemia Panel: No results for input(s): "VITAMINB12", "FOLATE", "FERRITIN", "TIBC", "IRON", "RETICCTPCT" in the last 72 hours. Sepsis Labs: No results for input(s): "PROCALCITON", "LATICACIDVEN" in the last 168 hours.  No results found for this or any previous visit (from the past 240 hour(s)).   Radiology Studies: No results found.  Scheduled Meds:  amLODipine  5 mg Oral Daily   diclofenac Sodium  2 g Topical TID   donepezil  5 mg Oral Q supper   enoxaparin (LOVENOX) injection  40 mg Subcutaneous Q24H   famotidine  40 mg Oral Daily   insulin aspart  0-9 Units Subcutaneous TID WC   latanoprost  1 drop Both Eyes QHS   levothyroxine  100 mcg Oral Q0600   loratadine  10 mg Oral Daily   oxyCODONE  5 mg Oral TID    polyethylene glycol  17 g Oral BID   potassium chloride SA  20 mEq Oral QPC lunch   QUEtiapine  100 mg Oral QPC supper   sodium chloride flush  3 mL Intravenous Q12H   traZODone  100 mg Oral QHS   Continuous Infusions:   LOS: 1 day    Time spent: 35 mins    Willeen Niece, MD [facility]   If 7PM-7AM, please contact night-coverage

## 2022-11-16 NOTE — Telephone Encounter (Signed)
Bonita Quin, daughter, called and stated that the CAP Services Form that [name] filled out needs to be completed. Stated that Page 3 There is a Question that is not marked asking if patient is in need of the Services. This needs to be marked.   No documentation is in the chart regarding the forms. Form Hayly Litsey be in the process of being scanned to patient's chart.   Daughter is going to email a copy of the paperwork to be completed.   Awaiting copy.

## 2022-11-16 NOTE — Progress Notes (Signed)
Mobility Specialist Progress Note:    11/16/22 1517  Mobility  Activity Ambulated with assistance in hallway  Level of Assistance Minimal assist, patient does 75% or more  Assistive Device Front wheel walker  Distance Ambulated (ft) 136 ft  Activity Response Tolerated well  Mobility Referral Yes  $Mobility charge 1 Mobility  Mobility Specialist Start Time (ACUTE ONLY) 1400  Mobility Specialist Stop Time (ACUTE ONLY) 1420  Mobility Specialist Time Calculation (min) (ACUTE ONLY) 20 min   Pt received sitting EOB, agreeable to ambulate. Pt needed max verbal cues to stay focused and on track. Before ambulating in the hallway, pt requested to use the [facility], void complete, needed assistance w/ pericare. Required MinA for STS and maintaining direction of walker.  Pt returned to bed w/ call bell and personal belongings in hand. All needs met.  Thompson Grayer Mobility Specialist  Please contact vis Secure Chat or  Rehab Office [phone]

## 2022-11-16 NOTE — Plan of Care (Signed)

## 2022-11-16 NOTE — Telephone Encounter (Signed)
Received CAP paperwork and placed in Joy Ward's folder to review. Highlighted area that needs to be checked.   To be faxed back to Fax: [phone] once completed.

## 2022-11-17 DIAGNOSIS — W19XXXA Unspecified fall, initial encounter: Secondary | ICD-10-CM | POA: Diagnosis not present

## 2022-11-17 DIAGNOSIS — I4891 Unspecified atrial fibrillation: Secondary | ICD-10-CM | POA: Diagnosis not present

## 2022-11-17 DIAGNOSIS — Y92009 Unspecified place in unspecified non-institutional (private) residence as the place of occurrence of the external cause: Secondary | ICD-10-CM | POA: Diagnosis not present

## 2022-11-17 LAB — CBC
HCT: 36.4 % (ref 36.0–46.0)
Hemoglobin: 11.9 g/dL — ABNORMAL LOW (ref 12.0–15.0)
MCH: 28.3 pg (ref 26.0–34.0)
MCHC: 32.7 g/dL (ref 30.0–36.0)
MCV: 86.7 fL (ref 80.0–100.0)
Platelets: 160 10*3/uL (ref 150–400)
RBC: 4.2 MIL/uL (ref 3.87–5.11)
RDW: 12.1 % (ref 11.5–15.5)
WBC: 4.4 10*3/uL (ref 4.0–10.5)
nRBC: 0 % (ref 0.0–0.2)

## 2022-11-17 LAB — GLUCOSE, CAPILLARY: Glucose-Capillary: 104 mg/dL — ABNORMAL HIGH (ref 70–99)

## 2022-11-17 MED ORDER — LEVOTHYROXINE SODIUM 100 MCG PO TABS: 100.0000 ug | ORAL_TABLET | Freq: Every day | ORAL | 1 refills | Status: DC

## 2022-11-17 NOTE — TOC Transition Note (Addendum)
Transition of Care [facility]) - CM/SW Discharge Note   Patient Details  Name: Joy Ward MRN: [id number] Date of Birth: [birth year]  [facility] CM/SW Contact:  Gala Lewandowsky, RN Phone Number: 11/17/2022, 11:51 AM   Clinical Narrative: Case Manager spoke with daughter this morning regarding disposition plans. Message sent to provider that the daughter wanted an update regarding patient. Plan will be to transition home today with [facility] and daughter is agreeable to services with [facility].  Frances Furbish has accepted the patient for [facility] PT/OT. DME Rolling Walker order via [employer]. Daughter will transport patient home via private vehicle. No further needs identified at this time.   DME bedside commode to be delivered to the room from [employer].   Final next level of care: Home w Home Health Services Barriers to Discharge: No Barriers Identified  Discharge Plan and Services Additional resources added to the After Visit Summary for   In-house Referral: NA Discharge Planning Services: CM Consult Post Acute Care Choice: Home Health          DME Arranged: Dan Humphreys rolling DME Agency: [employer] Date DME Agency Contacted: 11/17/22 Time DME Agency Contacted: 1100 Representative spoke with at DME Agency: [name] HH Arranged: PT, OT HH Agency: [facility] Date [facility] Agency Contacted: 11/17/22 Time HH Agency Contacted: 1151 Representative spoke with at [facility] Agency: Kandee Keen  Social Determinants of Health (SDOH) Interventions SDOH Screenings   Food Insecurity: Patient Declined (11/14/2022)  Housing: High Risk (11/14/2022)  Transportation Needs: Patient Declined (11/14/2022)  Utilities: Not At Risk (11/14/2022)  Depression (PHQ2-9): Low Risk  (06/12/2021)  Tobacco Use: Medium Risk (11/14/2022)   Readmission Risk Interventions     No data to display

## 2022-11-17 NOTE — Progress Notes (Signed)
Physical Therapy Treatment Patient Details Name: Joy Ward MRN: [id number] DOB: 19-Sep-1940 Today's Date: 11/17/2022   History of Present Illness Pt is an 82 y.o. female who presented 11/14/22 s/p fall and near syncopal event. EKG showed concern for second-degree AV heart block. Chest x-ray noted pulmonary nodule overlying lateral right base of lung. 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 Pt is redirectable, agreeable to hallway mobility. Pt mobilizing at supervision to CGA level at this time, and tolerates well with no physical signs of fatigue. Pt's son arrived at end of session, plan is for pt to d/c home today.      Assistance Recommended at Discharge Frequent or constant Supervision/Assistance  If plan is discharge home, recommend the following:  Can travel by private vehicle    A little help with walking and/or transfers;A little help with bathing/dressing/bathroom;Assistance with cooking/housework;Direct supervision/assist for medications management;Direct supervision/assist for financial management;Assist for transportation;Help with stairs or ramp for entrance      Equipment Recommendations  Rolling walker (2 wheels);BSC/3in1    Recommendations for Other Services       Precautions / Restrictions Precautions Precautions: Fall Restrictions Weight Bearing Restrictions: No     Mobility  Bed Mobility Overal bed mobility: Needs Assistance Bed Mobility: Sit to Supine           General bed mobility comments: OOB upon arrival to room    Transfers Overall transfer level: Needs assistance Equipment used: None Transfers: Sit to/from Stand Sit to Stand: Supervision           General transfer comment: for safety, requires cuing to perform stand and sit    Ambulation/Gait Ambulation/Gait assistance: Min guard Gait Distance (Feet): 100 Feet Assistive  device: None Gait Pattern/deviations: Step-through pattern, Decreased stride length, Trunk flexed, Drifts right/left Gait velocity: decr     General Gait Details: to/from nurses station with cues for hallway navigation   Stairs             Wheelchair Mobility     Tilt Bed    Modified Rankin (Stroke Patients Only)       Balance Overall balance assessment: Mild deficits observed, not formally tested                                          Cognition Arousal/Alertness: Awake/alert Behavior During Therapy: WFL for tasks assessed/performed Overall Cognitive Status: History of cognitive impairments - at baseline                                 General Comments: history of dementia, 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        Exercises      General Comments        Pertinent Vitals/Pain Pain Assessment Pain Assessment: Faces Faces Pain Scale: Hurts a little bit Pain Location: back Pain Intervention(s): Limited activity within patient's tolerance, Monitored during session, Repositioned    Home Living                          Prior Function  PT Goals (current goals can now be found in the care plan section) Acute Rehab PT Goals Patient Stated Goal: to go home PT Goal Formulation: With patient/family Time For Goal Achievement: 11/29/22 Potential to Achieve Goals: Good Progress towards PT goals: Progressing toward goals    Frequency    Min 1X/week      PT Plan Current plan remains appropriate    Co-evaluation              AM-PAC PT "6 Clicks" Mobility   Outcome Measure  Help needed turning from your back to your side while in a flat bed without using bedrails?: None Help needed moving from lying on your back to sitting on the side of a flat bed without using bedrails?: None Help needed moving to and from a bed to a chair (including a wheelchair)?: A Little Help needed  standing up from a chair using your arms (e.g., wheelchair or bedside chair)?: A Little Help needed to walk in hospital room?: A Little Help needed climbing 3-5 steps with a railing? : A Little 6 Click Score: 20    End of Session   Activity Tolerance: Patient tolerated treatment well Patient left: in bed;with call bell/phone within reach;with bed alarm set;with family/visitor present Nurse Communication: Mobility status PT Visit Diagnosis: Unsteadiness on feet (R26.81);Other abnormalities of gait and mobility (R26.89);Muscle weakness (generalized) (M62.81);History of falling (Z91.81);Difficulty in walking, not elsewhere classified (R26.2)     Time: 4098-1191 PT Time Calculation (min) (ACUTE ONLY): 18 min  Charges:    $Therapeutic Activity: 8-22 mins PT General Charges $$ ACUTE PT VISIT: 1 Visit                     Marye Round, PT DPT Acute Rehabilitation Services Secure Chat Preferred  Office [phone]    Quadarius Henton Sheliah Plane 11/17/2022, 2:57 PM

## 2022-11-17 NOTE — Progress Notes (Signed)
Mobility Specialist Progress Note:    11/17/22 1140  Mobility  Activity Ambulated with assistance in hallway  Level of Assistance Contact guard assist, steadying assist  Assistive Device Front wheel walker  Distance Ambulated (ft) 200 ft  Activity Response Tolerated well  Mobility Referral Yes  $Mobility charge 1 Mobility  Mobility Specialist Start Time (ACUTE ONLY) 1002  Mobility Specialist Stop Time (ACUTE ONLY) 1018  Mobility Specialist Time Calculation (min) (ACUTE ONLY) 16 min   Pt received in bed, agreeable to ambulate. Pt needed no physical assistance throughout session. During ambulation pt needed mod verbal cues to stay focused and assisted w/ walker proximity. Pt returned to room w/ call bell and personal belongings at reach. Bed alarm on.    Thompson Grayer Mobility Specialist  Please contact vis Secure Chat or  Rehab Office [phone]

## 2022-11-17 NOTE — Discharge Summary (Signed)
Physician Discharge Summary  Abbagail Scaff EAV:[id number] DOB: 15-Apr-1941 DOA: 11/14/2022  PCP: Octavia Heir, NP  Admit date: 11/14/2022  Discharge date: 11/17/2022  Admitted From: Home  Disposition:  Home Health services.  Recommendations for Outpatient Follow-up:  Follow up with PCP in 1-2 weeks Please obtain BMP/CBC in one week Advised to follow-up with cardiology as scheduled. Home Health services been arranged.  Home Health: Home PT/OT Equipment/Devices:None  Discharge Condition: Stable CODE STATUS:Full code Diet recommendation: Heart Healthy   Brief [facility] Course: This 82 y.o. female with medical history significant of hypertension, hyperlipidemia,  unable to tolerate statins, prior CVA, diabetes mellitus type 2, postsurgical hypothyroidism, dementia, and remote history of tobacco use who presents after having a fall.  Patient has baseline dementia.  At baseline she ambulates without assistance.  Last night she had gotten up and was walking started feeling weak and fell,  family was nearby and was able to get her to the ground.  Son reports her eyes rolled back in her head.  There was no head trauma or loss of consciousness.  She had been in her normal state of health eating and drinking like normal.  In the ED she was found to be in A-fib with heart rate in 130s. Pertinent labs include potassium 3.1, high-sensitivity troponin 12.  Chest x-ray noted pulmonary nodule overlying lateral right base of lung.  Patient was given IV fluid resuscitation and started on Cardizem and heparin drips.  Patient was admitted for further evaluation. Cardiologist was consulted, reviewed all the tracings and EKGs. He was not convinced that patient had atrial fibrillation,  it was tachycardia.  Cardiology signed off , heparin drip and Cardizem drip discontinued.  Electrolytes replaced.  TSH normal.  Echocardiogram completed showed LVEF 65 to 70% no RWMA.  Blood pressure has improved.  Patient  found to have suppressed TSH, thyroid dose was reduced.  Patient has baseline dementia so Aricept and Seroquel was continued.  Cardiology signed off.  Patient feels better and  wants to be discharged.  Patient being discharged home. Home health services arranged.  Discharge Diagnoses:  Principal Problem:   Fall at home, initial encounter Active Problems:   Second degree Mobitz I AV block   HYPOKALEMIA   Essential hypertension   Diabetes mellitus (HCC)   Hypothyroidism   Vascular dementia with behavior disturbance (HCC)   Chronic back pain   History of CVA (cerebrovascular accident)   Pulmonary nodule   Atrial fibrillation (HCC)  S/P Fall: Second-degree AV block: Patient presented with near syncopal event with initial EKG thought to be atrial fibrillation with heart rates elevated into the 130s.   She had been started on a Cardizem and heparin drip .   EKG reviewed by cardiology who has reviewed all the tracings and did not have any concerns about atrial fibrillation. Cardiologist states patient has second-degree AV block Mobitz type I.  -Discontinued heparin drip -Discontinued Cardizem drip as now back in sinus rhythm. -Potassium replaced.  Continue to monitor. -TSH 0.017 -Echo showed LVEF 65-70%. No RWMA. -Follow-up telemetry monitoring overnight -Cardiology consulted.  Formal consult pending.   Hypokalemia: Acute on Chronic. Patient is on scheduled potassium chloride of 20 meq po daily. Replaced.  Continue to monitor.   Essential hypertension: Blood pressures initially elevated up to 170/86.   Continue amlodipine 5 mg daily Continue Hydralazine IV as needed for elevated blood pressures   Diabetes mellitus type 2, without long-term use of insulin Home medication regimen includes metformin 500 mg  daily.  Last available hemoglobin A1c was 7.8 on 05/2022 -Hypoglycemic protocols -Hold metformin -CBGs before every meal with sensitive SSI.   Hypothyroidism: Home medication  includes levothyroxine 112 mcg daily. -Follow-up TSH(0.017)  -Decrease levothyroxine to 100 mcg daily -Patient will need new prescription for new dose at discharge.   - Should follow-up with primary care provider and have repeat testing 4 to 6 weeks.   Vascular dementia with behavioral disturbance: Patient has baseline dementia. Continue Delirium precautions. Bed alarm on Continue Aricept and Seroquel.   Chronic back pain: Continue oxycodone scheduled   History of CVA: Patient with chronic infarct of the left lateral cerebellum noted on MRI from 06/2019.  Noted to have intolerance to statins.   Pulmonary nodule Chest x-ray noted new pulmonary nodule overlying the lateral right base of the lung.  Patient has remote history of tobacco use. -Consider CT scan of the chest   Remote history of tobacco abuse:   GERD -Continue PPI  Discharge Instructions  Discharge Instructions     Call MD for:  difficulty breathing, headache or visual disturbances   Complete by: As directed    Call MD for:  persistant dizziness or light-headedness   Complete by: As directed    Call MD for:  persistant nausea and vomiting   Complete by: As directed    Diet - low sodium heart healthy   Complete by: As directed    Diet Carb Modified   Complete by: As directed    Discharge instructions   Complete by: As directed    Advised to follow up primary care physician in 1 week. Advised to follow-up with cardiology as scheduled. Home Health services been arranged.   Increase activity slowly   Complete by: As directed       Allergies as of 11/17/2022       Reactions   Clindamycin Hcl Other (See Comments)   REACTION: C. difficile colitis   Sulfamethoxazole-trimethoprim Other (See Comments)    Eye redness   Zanaflex [tizanidine Hcl] Anaphylaxis   Metronidazole Hives   Statins Other (See Comments)   Side Effect: muscle pain Has tried Zocor, Crestor--she does not want to take a statin, period   Bee  Venom Other (See Comments)   Unknown reaction   Ace Inhibitors Cough   Adhesive [tape] Itching, Rash, Other (See Comments)   Blistering of skin        Medication List     TAKE these medications    amLODipine 5 MG tablet Commonly known as: NORVASC TAKE 1 TABLET BY MOUTH EVERY DAY AFTER LUNCH What changed: See the new instructions.   Calcium 600+D3 Plus Minerals 600-800 MG-UNIT Tabs TAKE 1 TABLET BY MOUTH EVERY DAY AFTER LUNCH What changed: See the new instructions.   cetirizine 5 MG tablet Commonly known as: ZYRTEC TAKE 1 TABLET BY MOUTH EVERY DAY   diclofenac Sodium 1 % Gel Commonly known as: VOLTAREN APPLY 2GM TO LOWER BACK 3 TIMES DAILY What changed: See the new instructions.   donepezil 5 MG tablet Commonly known as: ARICEPT TAKE 1 TABLET BY MOUTH EVERY DAY at 5pm What changed: See the new instructions.   famotidine 40 MG tablet Commonly known as: PEPCID TAKE 1 TABLET BY MOUTH EVERY DAY   levothyroxine 100 MCG tablet Commonly known as: SYNTHROID Take 1 tablet (100 mcg total) by mouth daily at 6 (six) AM. Start taking on: November 18, 2022 What changed:  medication strength how much to take when to take this  Lumigan 0.01 % Soln Generic drug: bimatoprost INSTILL 1 DROP INTO EACH EYE AT BEDTIME TO CONTROL INTRAOCULAR PRESSURE What changed: See the new instructions.   metFORMIN 500 MG tablet Commonly known as: GLUCOPHAGE TAKE 1 TABLET BY MOUTH EVERY DAY AFTER LUNCH What changed: See the new instructions.   oxyCODONE 5 MG immediate release tablet Commonly known as: Oxy IR/ROXICODONE Take 10 mg by mouth 3 (three) times daily. Per Dr. Ethelene Hal   pantoprazole 40 MG tablet Commonly known as: PROTONIX TAKE 1 TABLET BY MOUTH EVERY DAY FOR GERD *do not crush* What changed: See the new instructions.   polyethylene glycol powder 17 GM/SCOOP powder Commonly known as: GLYCOLAX/MIRALAX Mix 17grams with liquid 2 times daily What changed: See the new instructions.    potassium chloride SA 20 MEQ tablet Commonly known as: KLOR-CON M Take 1 tablet (20 mEq total) by mouth daily after lunch.   QUEtiapine 100 MG tablet Commonly known as: SEROQUEL TAKE 1 TABLET BY MOUTH EVERY DAY AT 5pm What changed: See the new instructions.   traZODone 100 MG tablet Commonly known as: DESYREL TAKE 1 TABLET BY MOUTH EVERY DAY AT 5pm What changed: See the new instructions.               Durable Medical Equipment  (From admission, onward)           Start     Ordered   11/17/22 1232  For home use only DME Bedside commode  Once       Comments: Confined to one area.  Question:  Patient needs a bedside commode to treat with the following condition  Answer:  General weakness   11/17/22 1231   11/17/22 1112  For home use only DME Walker rolling  Once       Question Answer Comment  Walker: With 5 Inch Wheels   Patient needs a walker to treat with the following condition Near syncope   Patient needs a walker to treat with the following condition General weakness      11/17/22 1113            Follow-up Information     [place], [name] E, NP Follow up in 1 week(s).   Specialty: Adult Health Nurse Practitioner Contact information: [address] [place] [address] [phone]         [employer] Follow up.   Why: Rolling walker and bedside commode to be delivered to the room Contact information: [address], [place], [place] [address] Phone: [phone]        [facility], [facility] Follow up.   Specialty: Home Health Services Why: Physical and Occupational Therapy-office to call with visit times. Contact information: [address] [place] [address] [phone]                Allergies  Allergen Reactions   Clindamycin Hcl Other (See Comments)    REACTION: C. difficile colitis   Sulfamethoxazole-Trimethoprim Other (See Comments)     Eye redness   Zanaflex [Tizanidine Hcl] Anaphylaxis    Metronidazole Hives   Statins Other (See Comments)    Side Effect: muscle pain Has tried Zocor, Crestor--she does not want to take a statin, period   Bee Venom Other (See Comments)    Unknown reaction   Ace Inhibitors Cough   Adhesive [Tape] Itching, Rash and Other (See Comments)    Blistering of skin     Consultations: Cardiology   Procedures/Studies: DG Chest Port 1  View  Result Date: 11/14/2022 CLINICAL DATA:  Fall. EXAM: PORTABLE CHEST 1 VIEW COMPARISON:  10/20/2020 FINDINGS: The lungs are clear without focal pneumonia, edema, pneumothorax or pleural effusion. Pulmonary nodule overlying the lateral right base is new in the interval. Cardiopericardial silhouette is at upper limits of normal for size. No acute bony abnormality. Telemetry leads overlie the chest. IMPRESSION: 1. No acute cardiopulmonary findings. 2. New pulmonary nodule overlying the lateral right base. CT chest without contrast recommended to further evaluate. Electronically Signed   By: Kennith Center M.D.   On: 11/14/2022 06:03    Subjective: Patient was seen and examined at bedside.  Overnight events noted.   Patient reports doing much better and wants to be discharged.  Patient being discharged home. Home Health services arranged.  Discharge Exam: Vitals:   11/17/22 0459 11/17/22 0724  BP: (!) 160/75 (!) 156/73  Pulse: 65 64  Resp: 18 18  Temp: 98.6 F (37 C) 98.6 F (37 C)  SpO2:  100%   Vitals:   11/16/22 1629 11/16/22 2037 11/17/22 0459 11/17/22 0724  BP: (!) 152/82 (!) 149/88 (!) 160/75 (!) 156/73  Pulse: 71  65 64  Resp: 16 17 18 18   Temp: 98.5 F (36.9 C) 98.4 F (36.9 C) 98.6 F (37 C) 98.6 F (37 C)  TempSrc: Oral Oral Oral Oral  SpO2: 100% 100%  100%  Weight:      Height:        General: Pt is alert, awake, not in acute distress Cardiovascular: RRR, S1/S2 +, no rubs, no gallops Respiratory: CTA bilaterally, no wheezing, no rhonchi Abdominal: Soft, NT, ND, bowel sounds  + Extremities: no edema, no cyanosis    The results of significant diagnostics from this hospitalization (including imaging, microbiology, ancillary and laboratory) are listed below for reference.     Microbiology: No results found for this or any previous visit (from the past 240 hour(s)).   Labs: BNP (last 3 results) No results for input(s): "BNP" in the last 8760 hours. Basic Metabolic Panel: Recent Labs  Lab 11/14/22 0055 11/15/22 0633  NA 138 136  K 3.1* 3.5  CL 103 107  CO2 23 22  GLUCOSE 179* 134*  BUN 9 5*  CREATININE 0.82 0.63  CALCIUM 9.5 8.7*  MG 1.9  --    Liver Function Tests: No results for input(s): "AST", "ALT", "ALKPHOS", "BILITOT", "PROT", "ALBUMIN" in the last 168 hours. No results for input(s): "LIPASE", "AMYLASE" in the last 168 hours. No results for input(s): "AMMONIA" in the last 168 hours. CBC: Recent Labs  Lab 11/14/22 0055 11/15/22 0633 11/16/22 0412 11/17/22 0020  WBC 8.7 4.9 4.1 4.4  NEUTROABS 6.2  --   --   --   HGB 12.9 11.8* 12.4 11.9*  HCT 40.2 35.4* 38.8 36.4  MCV 87.8 85.7 91.1 86.7  PLT 174 166 125* 160   Cardiac Enzymes: No results for input(s): "CKTOTAL", "CKMB", "CKMBINDEX", "TROPONINI" in the last 168 hours. BNP: Invalid input(s): "POCBNP" CBG: Recent Labs  Lab 11/16/22 0740 11/16/22 1154 11/16/22 1628 11/16/22 2134 11/17/22 0731  GLUCAP 131* 159* 157* 127* 104*   D-Dimer No results for input(s): "DDIMER" in the last 72 hours. Hgb A1c Recent Labs    11/14/22 1610  HGBA1C 7.3*   Lipid Profile No results for input(s): "CHOL", "HDL", "LDLCALC", "TRIG", "CHOLHDL", "LDLDIRECT" in the last 72 hours. Thyroid function studies No results for input(s): "TSH", "T4TOTAL", "T3FREE", "THYROIDAB" in the last 72 hours.  Invalid input(s): "FREET3" Anemia  work up No results for input(s): "VITAMINB12", "FOLATE", "FERRITIN", "TIBC", "IRON", "RETICCTPCT" in the last 72 hours. Urinalysis    Component Value Date/Time    COLORURINE YELLOW 11/14/2022 0223   APPEARANCEUR CLEAR 11/14/2022 0223   LABSPEC 1.008 11/14/2022 0223   PHURINE 5.0 11/14/2022 0223   GLUCOSEU NEGATIVE 11/14/2022 0223   HGBUR SMALL (A) 11/14/2022 0223   HGBUR negative 02/14/2009 0938   BILIRUBINUR NEGATIVE 11/14/2022 0223   BILIRUBINUR Negatie 06/17/2022 1434   KETONESUR 5 (A) 11/14/2022 0223   PROTEINUR NEGATIVE 11/14/2022 0223   UROBILINOGEN 0.2 06/17/2022 1434   UROBILINOGEN 1.0 04/11/2021 1942   NITRITE NEGATIVE 11/14/2022 0223   LEUKOCYTESUR NEGATIVE 11/14/2022 0223   Sepsis Labs Recent Labs  Lab 11/14/22 0055 11/15/22 0633 11/16/22 0412 11/17/22 0020  WBC 8.7 4.9 4.1 4.4   Microbiology No results found for this or any previous visit (from the past 240 hour(s)).   Time coordinating discharge: Over 30 minutes  SIGNED:   Willeen Niece, MD  [facility] 11/17/2022, 3:01 PM Pager   If 7PM-7AM, please contact night-coverage

## 2022-11-17 NOTE — Discharge Instructions (Signed)
Advised to follow up primary care physician in 1 week. Advised to follow-up with cardiology as scheduled. Home Health services been arranged.

## 2022-11-17 NOTE — Plan of Care (Signed)
  Problem: Education: Goal: Ability to describe self-care measures that may prevent or decrease complications (Diabetes Survival Skills Education) will improve Outcome: Progressing Goal: Individualized Educational Video(s) Outcome: Progressing   Problem: Coping: Goal: Ability to adjust to condition or change in health will improve Outcome: Progressing   Problem: Fluid Volume: Goal: Ability to maintain a balanced intake and output will improve Outcome: Progressing   Problem: Health Behavior/Discharge Planning: Goal: Ability to identify and utilize available resources and services will improve Outcome: Progressing Goal: Ability to manage health-related needs will improve Outcome: Progressing   Problem: Metabolic: Goal: Ability to maintain appropriate glucose levels will improve Outcome: Progressing   Problem: Nutritional: Goal: Maintenance of adequate nutrition will improve Outcome: Progressing Goal: Progress toward achieving an optimal weight will improve Outcome: Progressing   Problem: Skin Integrity: Goal: Risk for impaired skin integrity will decrease Outcome: Progressing   Problem: Tissue Perfusion: Goal: Adequacy of tissue perfusion will improve Outcome: Progressing   Problem: Safety: Goal: Non-violent Restraint(s) Outcome: Progressing   Problem: Education: Goal: Knowledge of General Education information will improve Description: Including pain rating scale, medication(s)/side effects and non-pharmacologic comfort measures Outcome: Progressing   Problem: Health Behavior/Discharge Planning: Goal: Ability to manage health-related needs will improve Outcome: Progressing   Problem: Clinical Measurements: Goal: Ability to maintain clinical measurements within normal limits will improve Outcome: Progressing Goal: Will remain free from infection Outcome: Progressing Goal: Diagnostic test results will improve Outcome: Progressing Goal: Respiratory complications  will improve Outcome: Progressing Goal: Cardiovascular complication will be avoided Outcome: Progressing   Problem: Activity: Goal: Risk for activity intolerance will decrease Outcome: Progressing   Problem: Nutrition: Goal: Adequate nutrition will be maintained Outcome: Progressing   Problem: Coping: Goal: Level of anxiety will decrease Outcome: Progressing   Problem: Elimination: Goal: Will not experience complications related to bowel motility Outcome: Progressing Goal: Will not experience complications related to urinary retention Outcome: Progressing   Problem: Pain Managment: Goal: General experience of comfort will improve Outcome: Progressing   Problem: Safety: Goal: Ability to remain free from injury will improve Outcome: Progressing   Problem: Skin Integrity: Goal: Risk for impaired skin integrity will decrease Outcome: Progressing   

## 2022-11-17 NOTE — Plan of Care (Signed)
  Problem: Nutritional: Goal: Maintenance of adequate nutrition will improve Outcome: Progressing Goal: Progress toward achieving an optimal weight will improve Outcome: Progressing   Problem: Skin Integrity: Goal: Risk for impaired skin integrity will decrease Outcome: Progressing   Problem: Clinical Measurements: Goal: Ability to maintain clinical measurements within normal limits will improve Outcome: Progressing Goal: Will remain free from infection Outcome: Progressing Goal: Diagnostic test results will improve Outcome: Progressing Goal: Cardiovascular complication will be avoided Outcome: Progressing   Problem: Nutrition: Goal: Adequate nutrition will be maintained Outcome: Progressing   Problem: Pain Managment: Goal: General experience of comfort will improve Outcome: Progressing   Problem: Safety: Goal: Ability to remain free from injury will improve Outcome: Progressing

## 2022-11-19 ENCOUNTER — Telehealth: Payer: Self-pay

## 2022-11-19 NOTE — Telephone Encounter (Signed)
Paperwork signed and given to CI.

## 2022-11-19 NOTE — Transitions of Care (Post Inpatient/ED Visit) (Signed)
   11/19/2022  Name: Latifa Noble MRN: [id number] DOB: 10/09/1940  Today's TOC FU Call Status: Today's TOC FU Call Status:: Unsuccessul Call (1st Attempt) Unsuccessful Call (1st Attempt) Date: 11/19/22  Attempted to reach the patient regarding the most recent Inpatient/ED visit.  Follow Up Plan: Additional outreach attempts will be made to reach the patient to complete the Transitions of Care (Post Inpatient/ED visit) call.   Jodelle Gross, RN, BSN, CCM Care Management Coordinator [place]/[facility] Phone: [phone]/Fax: [phone]

## 2022-11-20 ENCOUNTER — Telehealth: Payer: Self-pay

## 2022-11-20 NOTE — Transitions of Care (Post Inpatient/ED Visit) (Signed)
   11/20/2022  Name: Ammy Mcelveen MRN: [id number] DOB: [birth year]  Today's TOC FU Call Status: Today's TOC FU Call Status:: Unsuccessful Call (2nd Attempt) Unsuccessful Call (2nd Attempt) Date: 11/20/22  Attempted to reach the patient regarding the most recent Inpatient/ED visit.  Follow Up Plan: Additional outreach attempts will be made to reach the patient to complete the Transitions of Care (Post Inpatient/ED visit) call.   Jodelle Gross, RN, BSN, CCM Care Management Coordinator [place]/[facility] Phone: [phone]/Fax: [phone]

## 2022-11-23 ENCOUNTER — Telehealth: Payer: Self-pay

## 2022-11-23 NOTE — Transitions of Care (Post Inpatient/ED Visit) (Signed)
   11/23/2022  Name: Shearon Boler MRN: [id number] DOB: 1940-06-18  Today's TOC FU Call Status: Today's TOC FU Call Status:: Unsuccessful Call (3rd Attempt) Unsuccessful Call (3rd Attempt) Date: 11/23/22  Attempted to reach the patient regarding the most recent Inpatient/ED visit.  Follow Up Plan: No further outreach attempts will be made at this time. We have been unable to contact the patient.  Antionette Fairy, RN,BSN,CCM [facility] Care Management Care Management Community Coordinator Direct Phone: [phone] Toll Free: [phone] Fax: [phone]

## 2022-11-26 ENCOUNTER — Encounter: Payer: Self-pay | Admitting: Orthopedic Surgery

## 2022-11-26 ENCOUNTER — Ambulatory Visit (INDEPENDENT_AMBULATORY_CARE_PROVIDER_SITE_OTHER): Payer: Medicare Other | Admitting: Orthopedic Surgery

## 2022-11-26 VITALS — BP 110/78 | HR 91 | Temp 96.9°F | Resp 16 | Ht 64.0 in | Wt 151.2 lb

## 2022-11-26 DIAGNOSIS — E1169 Type 2 diabetes mellitus with other specified complication: Secondary | ICD-10-CM | POA: Diagnosis not present

## 2022-11-26 DIAGNOSIS — I441 Atrioventricular block, second degree: Secondary | ICD-10-CM

## 2022-11-26 DIAGNOSIS — E876 Hypokalemia: Secondary | ICD-10-CM | POA: Diagnosis not present

## 2022-11-26 DIAGNOSIS — E039 Hypothyroidism, unspecified: Secondary | ICD-10-CM

## 2022-11-26 DIAGNOSIS — F01518 Vascular dementia, unspecified severity, with other behavioral disturbance: Secondary | ICD-10-CM | POA: Diagnosis not present

## 2022-11-26 DIAGNOSIS — R911 Solitary pulmonary nodule: Secondary | ICD-10-CM

## 2022-11-26 LAB — GLUCOSE, POCT (MANUAL RESULT ENTRY): POC Glucose: 177 mg/dl — AB (ref 70–99)

## 2022-11-26 MED ORDER — HYDROXYZINE PAMOATE 25 MG PO CAPS: 25.0000 mg | ORAL_CAPSULE | Freq: Two times a day (BID) | ORAL | 1 refills | Status: DC | PRN

## 2022-11-26 NOTE — Progress Notes (Signed)
Careteam: Patient Care Team: Joy Heir, NP as PCP - General (Adult Health Nurse Practitioner)  Seen by: Hazle Nordmann, AGNP-C  PLACE OF SERVICE:  [facility]  Advanced Directive information Does Patient Have a Medical Advance Directive?: Yes, Type of Advance Directive: Healthcare Power of Attorney, Does patient want to make changes to medical advance directive?: No - Patient declined  Allergies  Allergen Reactions   Clindamycin Hcl Other (See Comments)    REACTION: C. difficile colitis   Sulfamethoxazole-Trimethoprim Other (See Comments)     Eye redness   Zanaflex [Tizanidine Hcl] Anaphylaxis   Metronidazole Hives   Statins Other (See Comments)    Side Effect: muscle pain Has tried Zocor, Crestor--she does not want to take a statin, period   Bee Venom Other (See Comments)    Unknown reaction   Ace Inhibitors Cough   Adhesive [Tape] Itching, Rash and Other (See Comments)    Blistering of skin     Chief Complaint  Patient presents with   Hospitalization Follow-up    11/14/2022-11/17/2022     HPI: Patient is a 82 y.o. female seen today s/p hospitalization 07/20-07/23.   Daughter and son present during encounter.   07/23 she fell at home and could not get up. ED ekg noted atrial fibrillation with HR 130's. K+ 3.1, troponin 12. CXR noted pulmonary nodule to right lung base. She was started on cardizem and heparin gtt. Cardiology reviewed several EKGs and did not think she was atrial fibrillation. Appears she has second-degree AV block Mobitz type I. TSH was low. Echo LVEF 65 to 70%. She was taken off cardizem and heparin. She was discharged with po potassium. Advised to follow up with cardiology.   Today, she does not recall hospitalization. Poor historian due to dementia. Daughter reports she is eating 1 good meal daily. Weight down 20 lbs from earlier this year. She continues to have behaviors at night. She gets Seroquel at about 6 pm before behaviors and it has helped some.  She recently was taken out of [employer] by daughter. She is now living with her daughter and son. She has been inappropriate to son, thinking he was her husband. She will also be argumemtitive. No falls or injuries. She was discharged with a FWW, but not using it.        Review of Systems:  Review of Systems  Unable to perform ROS: Dementia    Past Medical History:  Diagnosis Date   Allergy    Arthritis    Chronic back pain    DDD (degenerative disc disease), lumbar    Diabetes mellitus without complication (HCC)    Diabetes type 2, uncontrolled 09/18/2006   Annotation: Noninsulin dependent Qualifier: Diagnosis of  By: Delrae Alfred MD, Lanora Manis     Former tobacco use    Hyperlipidemia    a. H/o muscle aches with statins per PCP notes.   Hypertension    Hypothyroidism    a. prior thyroid removal.   Memory loss or impairment 03/29/2019   Stroke [facility])    a. Listed in PCP notes: "History of stroke:  History of terrible headache.  Had a CT scan of brain and found "ministroke"  Was removed from ERT subsequently. "   Past Surgical History:  Procedure Laterality Date   ABDOMINAL HYSTERECTOMY  1994   TAH, not clear if unilateral oophorectomy as well.   CESAREAN SECTION  1972, 1976, 1978   CHOLECYSTECTOMY  1973   open   DECOMPRESSIVE LUMBAR  LAMINECTOMY LEVEL 4  2005   In [place]   THYROIDECTOMY  2012   multinodular goiter   Social History:   reports that she has quit smoking. She has never used smokeless tobacco. She reports that she does not drink alcohol and does not use drugs.  Family History  Problem Relation Age of Onset   Heart disease Mother        unclear details "atherosclerosis"   Diabetes Mother    Alcohol abuse Sister    Diabetes Brother    Diabetes Brother    Drug abuse Brother    Diabetes Daughter    Heart disease Son        CHF   Alcohol abuse Son     Medications: Patient's Medications  New Prescriptions   No medications on file  Previous  Medications   AMLODIPINE (NORVASC) 5 MG TABLET    TAKE 1 TABLET BY MOUTH EVERY DAY AFTER LUNCH   CALCIUM CARBONATE-VIT D-MIN (CALCIUM 600+D3 PLUS MINERALS) 600-800 MG-UNIT TABS    TAKE 1 TABLET BY MOUTH EVERY DAY AFTER LUNCH   CETIRIZINE (ZYRTEC) 5 MG TABLET    TAKE 1 TABLET BY MOUTH EVERY DAY   DICLOFENAC SODIUM (VOLTAREN) 1 % GEL    APPLY 2GM TO LOWER BACK 3 TIMES DAILY   DONEPEZIL (ARICEPT) 5 MG TABLET    TAKE 1 TABLET BY MOUTH EVERY DAY at 5pm   FAMOTIDINE (PEPCID) 40 MG TABLET    Take 40 mg by mouth as needed for heartburn or indigestion.   LEVOTHYROXINE (SYNTHROID) 100 MCG TABLET    Take 1 tablet (100 mcg total) by mouth daily at 6 (six) AM.   LUMIGAN 0.01 % SOLN    INSTILL 1 DROP INTO EACH EYE AT BEDTIME TO CONTROL INTRAOCULAR PRESSURE   METFORMIN (GLUCOPHAGE) 500 MG TABLET    TAKE 1 TABLET BY MOUTH EVERY DAY AFTER LUNCH   OXYCODONE (OXY IR/ROXICODONE) 5 MG IMMEDIATE RELEASE TABLET    Take 10 mg by mouth 3 (three) times daily. Per Dr. Ethelene Hal   PANTOPRAZOLE (PROTONIX) 40 MG TABLET    TAKE 1 TABLET BY MOUTH EVERY DAY FOR GERD *do not crush*   POLYETHYLENE GLYCOL POWDER (GLYCOLAX/MIRALAX) 17 GM/SCOOP POWDER    Mix 17grams with liquid 2 times daily   POTASSIUM CHLORIDE SA (KLOR-CON) 20 MEQ TABLET    Take 1 tablet (20 mEq total) by mouth daily after lunch.   QUETIAPINE (SEROQUEL) 100 MG TABLET    TAKE 1 TABLET BY MOUTH EVERY DAY AT 5pm   TRAZODONE (DESYREL) 100 MG TABLET    TAKE 1 TABLET BY MOUTH EVERY DAY AT 5pm  Modified Medications   No medications on file  Discontinued Medications   FAMOTIDINE (PEPCID) 40 MG TABLET    TAKE 1 TABLET BY MOUTH EVERY DAY    Physical Exam:  Vitals:   11/26/22 1431  BP: 110/78  Pulse: (!) 106  Resp: 16  Temp: (!) 96.9 F (36.1 C)  SpO2: 98%  Weight: 151 lb 3.2 oz (68.6 kg)  Height: 5\' 4"  (1.626 m)   Body mass index is 25.95 kg/m. Wt Readings from Last 3 Encounters:  11/26/22 151 lb 3.2 oz (68.6 kg)  11/14/22 173 lb 8 oz (78.7 kg)  06/17/22 173  lb 9.6 oz (78.7 kg)    Physical Exam Vitals reviewed.  Constitutional:      General: She is not in acute distress. HENT:     Head: Normocephalic.  Eyes:     General:  Right eye: No discharge.        Left eye: No discharge.  Cardiovascular:     Rate and Rhythm: Normal rate and regular rhythm.     Pulses: Normal pulses.     Heart sounds: Normal heart sounds.  Pulmonary:     Effort: Pulmonary effort is normal. No respiratory distress.     Breath sounds: Normal breath sounds. No wheezing.  Abdominal:     General: Bowel sounds are normal. There is no distension.     Palpations: Abdomen is soft.     Tenderness: There is no abdominal tenderness.  Musculoskeletal:     Cervical back: Neck supple.     Right lower leg: No edema.     Left lower leg: No edema.  Skin:    General: Skin is warm.     Capillary Refill: Capillary refill takes less than 2 seconds.  Neurological:     General: No focal deficit present.     Mental Status: She is alert. Mental status is at baseline.     Gait: Gait abnormal.  Psychiatric:        Mood and Affect: Mood normal.        Behavior: Behavior normal.     Comments: Alert to self/familiar face, follows commands, aphasia    Labs reviewed: Basic Metabolic Panel: Recent Labs    06/17/22 1427 06/24/22 0000 11/14/22 0055 11/15/22 0633  NA  --   --  138 136  K  --   --  3.1* 3.5  CL  --   --  103 107  CO2  --   --  23 22  GLUCOSE  --   --  179* 134*  BUN  --   --  9 5*  CREATININE  --   --  0.82 0.63  CALCIUM  --   --  9.5 8.7*  MG  --   --  1.9  --   TSH 6.01* 16.80* 0.017*  --    Liver Function Tests: No results for input(s): "AST", "ALT", "ALKPHOS", "BILITOT", "PROT", "ALBUMIN" in the last 8760 hours. No results for input(s): "LIPASE", "AMYLASE" in the last 8760 hours. No results for input(s): "AMMONIA" in the last 8760 hours. CBC: Recent Labs    11/14/22 0055 11/15/22 0633 11/16/22 0412 11/17/22 0020  WBC 8.7 4.9 4.1 4.4   NEUTROABS 6.2  --   --   --   HGB 12.9 11.8* 12.4 11.9*  HCT 40.2 35.4* 38.8 36.4  MCV 87.8 85.7 91.1 86.7  PLT 174 166 125* 160   Lipid Panel: No results for input(s): "CHOL", "HDL", "LDLCALC", "TRIG", "CHOLHDL", "LDLDIRECT" in the last 8760 hours. TSH: Recent Labs    06/17/22 1427 06/24/22 0000 11/14/22 0055  TSH 6.01* 16.80* 0.017*   A1C: Lab Results  Component Value Date   HGBA1C 7.3 (H) 11/14/2022     Assessment/Plan 1. Second degree Mobitz I AV block - hospitalized 07/20-07/23 - initially thought atrial fibrillation> was on cardizem and heparin gtt  - cardiology consulted> EKG's NSR with Mobitz I AV block - cardiology consult recommended- family will think about  - Complete Metabolic Panel with eGFR - CBC with Differential/Platelet  2. Type 2 diabetes mellitus with other specified complication, without long-term current use of insulin (HCC) - A1c 7.3> goal < 7.0 - no hypoglycemia - cont diet low in sugar and carbs - cont metformin  - POC Glucose (CBG)  3. Vascular dementia with behavior disturbance (HCC) - now living  with daughter and son - continues to sundown, also argumentative, inappropriate with son at times - 20 lb weight loss x 6 months - discussed increasing calories or offering snacks - poor safety awareness, not using FWW - start hydroxyzine for increased behaviors/agitation - cont Seroquel and Aricept - recommend hospice is weights continue to decline - hydrOXYzine (VISTARIL) 25 MG capsule; Take 1 capsule (25 mg total) by mouth 2 (two) times daily as needed.  Dispense: 30 capsule; Refill: 1  4. Hypokalemia - 3.1 during hospitalization> 3.9 (08/01)  5. Acquired hypothyroidism - TSH> 0.02 (08/01)> was 0.017 (07/20) - will reduce to 88 mcg - recheck TSH in 6-8 weeks, add on T4 today - levothyroxine (SYNTHROID) 88 MCG tablet; Take 1 tablet (88 mcg total) by mouth daily.  Dispense: 90 tablet; Refill: 3 - TSH; Future - T4  6. Pulmonary  nodule - 07/20 CXR new pulmonary nodule to lateral right base - CT chest recommended- will discuss goals of care next visit  - h/o smoking   Total time: 38 minutes. Greater than 50% of total time spent doing patient education regarding recent hospitalization, dementia, T2DM, lab results and falls safety including symptom/medication management.    Next appt: 12/17/2022  Hazle Nordmann, Juel Burrow  [facility] [phone]

## 2022-11-26 NOTE — Patient Instructions (Addendum)
Use hydroxyzine as needed for behaviors> please let me know if you are using all the time  Cranberry supplement for UTI prevention  Recommend tumbler system for adequate hydration  Glucerna meal supplement shake in case she is not eating well  Offer snacks or favorite foods to promote calorie intake

## 2022-11-27 ENCOUNTER — Other Ambulatory Visit: Payer: Self-pay | Admitting: Orthopedic Surgery

## 2022-11-27 MED ORDER — LEVOTHYROXINE SODIUM 88 MCG PO TABS: 88.0000 ug | ORAL_TABLET | Freq: Every day | ORAL | 3 refills | Status: DC

## 2022-12-17 ENCOUNTER — Encounter: Payer: Medicare Other | Admitting: Orthopedic Surgery

## 2022-12-18 NOTE — Progress Notes (Signed)
This encounter was created in error - please disregard.

## 2022-12-22 ENCOUNTER — Other Ambulatory Visit: Payer: Self-pay | Admitting: Orthopedic Surgery

## 2022-12-22 DIAGNOSIS — J3089 Other allergic rhinitis: Secondary | ICD-10-CM

## 2022-12-23 ENCOUNTER — Other Ambulatory Visit: Payer: Self-pay | Admitting: Orthopedic Surgery

## 2022-12-23 NOTE — Telephone Encounter (Signed)
Patient medication has allergy contraindications. Medication pend and sent to PCP Octavia Heir, NP

## 2023-01-06 ENCOUNTER — Other Ambulatory Visit: Payer: Self-pay

## 2023-01-06 DIAGNOSIS — E1169 Type 2 diabetes mellitus with other specified complication: Secondary | ICD-10-CM

## 2023-01-06 MED ORDER — METFORMIN HCL 500 MG PO TABS
500.0000 mg | ORAL_TABLET | Freq: Every day | ORAL | 1 refills | Status: DC
Start: 2023-01-06 — End: 2023-04-23

## 2023-01-14 ENCOUNTER — Encounter: Payer: Self-pay | Admitting: Orthopedic Surgery

## 2023-01-14 NOTE — Telephone Encounter (Signed)
Message routed to PCP [place], [name] E, NP  

## 2023-01-21 ENCOUNTER — Ambulatory Visit (INDEPENDENT_AMBULATORY_CARE_PROVIDER_SITE_OTHER): Payer: Medicare Other | Admitting: Orthopedic Surgery

## 2023-01-21 ENCOUNTER — Encounter: Payer: Self-pay | Admitting: Orthopedic Surgery

## 2023-01-21 VITALS — BP 158/80 | HR 85 | Temp 96.6°F | Resp 16 | Ht 64.0 in | Wt 143.6 lb

## 2023-01-21 DIAGNOSIS — F01518 Vascular dementia, unspecified severity, with other behavioral disturbance: Secondary | ICD-10-CM | POA: Diagnosis not present

## 2023-01-21 DIAGNOSIS — E039 Hypothyroidism, unspecified: Secondary | ICD-10-CM

## 2023-01-21 DIAGNOSIS — E876 Hypokalemia: Secondary | ICD-10-CM

## 2023-01-21 DIAGNOSIS — E1169 Type 2 diabetes mellitus with other specified complication: Secondary | ICD-10-CM | POA: Diagnosis not present

## 2023-01-21 DIAGNOSIS — R109 Unspecified abdominal pain: Secondary | ICD-10-CM | POA: Diagnosis not present

## 2023-01-21 DIAGNOSIS — R634 Abnormal weight loss: Secondary | ICD-10-CM

## 2023-01-21 DIAGNOSIS — R197 Diarrhea, unspecified: Secondary | ICD-10-CM

## 2023-01-21 LAB — CBC WITH DIFFERENTIAL/PLATELET
Absolute Monocytes: 389 cells/uL (ref 200–950)
Basophils Absolute: 29 cells/uL (ref 0–200)
Basophils Relative: 0.4 %
Eosinophils Absolute: 108 cells/uL (ref 15–500)
Eosinophils Relative: 1.5 %
HCT: 42.7 % (ref 35.0–45.0)
Hemoglobin: 13.7 g/dL (ref 11.7–15.5)
Lymphs Abs: 1714 cells/uL (ref 850–3900)
MCH: 27.8 pg (ref 27.0–33.0)
MCHC: 32.1 g/dL (ref 32.0–36.0)
MCV: 86.6 fL (ref 80.0–100.0)
MPV: 12.2 fL (ref 7.5–12.5)
Monocytes Relative: 5.4 %
Neutro Abs: 4961 cells/uL (ref 1500–7800)
Neutrophils Relative %: 68.9 %
Platelets: 231 10*3/uL (ref 140–400)
RBC: 4.93 10*6/uL (ref 3.80–5.10)
RDW: 12.9 % (ref 11.0–15.0)
Total Lymphocyte: 23.8 %
WBC: 7.2 10*3/uL (ref 3.8–10.8)

## 2023-01-21 NOTE — Patient Instructions (Addendum)
Referral made to [facility] due to progressive weight loss/ advanced dementia  Please go to [facility] to have KUB (abdominal xray) done  I will call with results xray and lab results

## 2023-01-21 NOTE — Progress Notes (Signed)
Careteam: Patient Care Team: Octavia Heir, NP as PCP - General (Adult Health Nurse Practitioner)  Seen by: Hazle Nordmann, AGNP-C  PLACE OF SERVICE:  [facility]  Advanced Directive information Does Patient Have a Medical Advance Directive?: Yes, Type of Advance Directive: Healthcare Power of Attorney, Does patient want to make changes to medical advance directive?: No - Patient declined  Allergies  Allergen Reactions   Clindamycin Hcl Other (See Comments)    REACTION: C. difficile colitis   Sulfamethoxazole-Trimethoprim Other (See Comments)     Eye redness   Zanaflex [Tizanidine Hcl] Anaphylaxis   Metronidazole Hives   Statins Other (See Comments)    Side Effect: muscle pain Has tried Zocor, Crestor--she does not want to take a statin, period   Bee Venom Other (See Comments)    Unknown reaction   Ace Inhibitors Cough   Adhesive [Tape] Itching, Rash and Other (See Comments)    Blistering of skin     Chief Complaint  Patient presents with   Acute Visit    Patient daughter complains she's not eating and losing weight.      HPI: Patient is a 82 y.o. female seen today for acute visit due to weight loss.   Current weight trends> 143 (09/26)> was 151 (08/01)> was 173 (02/21). Poor historian due to dementia. She lives with daughter who is main caregiver. She was resident at [facility] up until this past summer. She has been eating less since moving in with daughter. She is struggling to get her to eat one good meal a day. Daughter requesting hospice referral today.   TSH was 0.02 (08/01), her levothyroxine was reduced to 88 mcg daily. Recheck levels today.   She had incontinent episode of stool in office. Daughter reports bowel movements are inconsistent due to poor appetite. More diarrhea within past week. She does c/o low back pain which is normal for her. No nausea or vomiting. She is also not eating healthy foods and eating a lot of sweets.   Review of Systems:  Review  of Systems  Unable to perform ROS: Dementia    Past Medical History:  Diagnosis Date   Allergy    Arthritis    Chronic back pain    DDD (degenerative disc disease), lumbar    Diabetes mellitus without complication (HCC)    Diabetes type 2, uncontrolled 09/18/2006   Annotation: Noninsulin dependent Qualifier: Diagnosis of  By: Delrae Alfred MD, Lanora Manis     Former tobacco use    Hyperlipidemia    a. H/o muscle aches with statins per PCP notes.   Hypertension    Hypothyroidism    a. prior thyroid removal.   Memory loss or impairment 03/29/2019   Stroke [facility])    a. Listed in PCP notes: "History of stroke:  History of terrible headache.  Had a CT scan of brain and found "ministroke"  Was removed from ERT subsequently. "   Past Surgical History:  Procedure Laterality Date   ABDOMINAL HYSTERECTOMY  1994   TAH, not clear if unilateral oophorectomy as well.   CESAREAN SECTION  1972, 12, 1978   CHOLECYSTECTOMY  1973   open   DECOMPRESSIVE LUMBAR LAMINECTOMY LEVEL 4  2005   In [place]   THYROIDECTOMY  2012   multinodular goiter   Social History:   reports that she has quit smoking. She has never used smokeless tobacco. She reports that she does not drink alcohol and does not use drugs.  Family History  Problem Relation Age of Onset   Heart disease Mother        unclear details "atherosclerosis"   Diabetes Mother    Alcohol abuse Sister    Diabetes Brother    Diabetes Brother    Drug abuse Brother    Diabetes Daughter    Heart disease Son        CHF   Alcohol abuse Son     Medications: Patient's Medications  New Prescriptions   No medications on file  Previous Medications   AMLODIPINE (NORVASC) 5 MG TABLET    TAKE 1 TABLET BY MOUTH EVERY DAY AFTER LUNCH   CALCIUM CARBONATE-VIT D-MIN (CALCIUM 600+D3 PLUS MINERALS) 600-800 MG-UNIT TABS    TAKE 1 TABLET BY MOUTH EVERY DAY AFTER LUNCH   CETIRIZINE (ZYRTEC) 5 MG TABLET    TAKE 1 TABLET BY MOUTH EVERY DAY   DICLOFENAC  SODIUM (VOLTAREN) 1 % GEL    APPLY 2GM TO LOWER BACK 3 TIMES DAILY   DONEPEZIL (ARICEPT) 5 MG TABLET    TAKE 1 TABLET BY MOUTH EVERY DAY at 5pm   FAMOTIDINE (PEPCID) 40 MG TABLET    TAKE 1 TABLET BY MOUTH EVERY DAY   HYDROXYZINE (VISTARIL) 25 MG CAPSULE    Take 1 capsule (25 mg total) by mouth 2 (two) times daily as needed.   LEVOTHYROXINE (SYNTHROID) 88 MCG TABLET    Take 1 tablet (88 mcg total) by mouth daily.   LUMIGAN 0.01 % SOLN    INSTILL 1 DROP INTO EACH EYE AT BEDTIME TO CONTROL INTRAOCULAR PRESSURE   METFORMIN (GLUCOPHAGE) 500 MG TABLET    Take 1 tablet (500 mg total) by mouth daily.   OXYCODONE (OXY IR/ROXICODONE) 5 MG IMMEDIATE RELEASE TABLET    Take 10 mg by mouth 3 (three) times daily. Per Dr. Ethelene Hal   PANTOPRAZOLE (PROTONIX) 40 MG TABLET    TAKE 1 TABLET BY MOUTH EVERY DAY FOR GERD *do not crush*   POLYETHYLENE GLYCOL POWDER (GLYCOLAX/MIRALAX) 17 GM/SCOOP POWDER    Mix 17grams with liquid 2 times daily   POTASSIUM CHLORIDE SA (KLOR-CON) 20 MEQ TABLET    Take 1 tablet (20 mEq total) by mouth daily after lunch.   QUETIAPINE (SEROQUEL) 100 MG TABLET    TAKE 1 TABLET BY MOUTH EVERY DAY AT 5PM   TRAZODONE (DESYREL) 100 MG TABLET    TAKE 1 TABLET BY MOUTH EVERY DAY AT 5pm  Modified Medications   No medications on file  Discontinued Medications   No medications on file    Physical Exam:  Vitals:   01/21/23 1535  BP: (!) 158/80  Pulse: 85  Resp: 16  Temp: (!) 96.6 F (35.9 C)  SpO2: 98%  Weight: 143 lb 9.6 oz (65.1 kg)  Height: 5\' 4"  (1.626 m)   Body mass index is 24.65 kg/m. Wt Readings from Last 3 Encounters:  01/21/23 143 lb 9.6 oz (65.1 kg)  11/26/22 151 lb 3.2 oz (68.6 kg)  11/14/22 173 lb 8 oz (78.7 kg)    Physical Exam Vitals reviewed.  Constitutional:      General: She is not in acute distress. HENT:     Head: Normocephalic.  Eyes:     General:        Right eye: No discharge.        Left eye: No discharge.  Cardiovascular:     Rate and Rhythm: Normal  rate and regular rhythm.     Pulses: Normal pulses.     Heart  sounds: Normal heart sounds.  Pulmonary:     Effort: Pulmonary effort is normal. No respiratory distress.     Breath sounds: Normal breath sounds. No wheezing.  Abdominal:     General: There is distension.     Tenderness: There is no abdominal tenderness. There is no guarding.     Comments: Hypoactive bowel sounds RLQ and LLQ  Musculoskeletal:     Cervical back: Neck supple.     Right lower leg: No edema.     Left lower leg: No edema.  Skin:    General: Skin is warm.     Capillary Refill: Capillary refill takes less than 2 seconds.  Neurological:     General: No focal deficit present.     Mental Status: She is alert. Mental status is at baseline.     Gait: Gait abnormal.  Psychiatric:        Mood and Affect: Mood normal.     Comments: Follows commands, alert to self, trouble recognizing daughter at times today     Labs reviewed: Basic Metabolic Panel: Recent Labs    06/24/22 0000 11/14/22 0055 11/15/22 0633 11/26/22 1521  NA  --  138 136 137  K  --  3.1* 3.5 3.9  CL  --  103 107 102  CO2  --  23 22 23   GLUCOSE  --  179* 134* 190*  BUN  --  9 5* 9  CREATININE  --  0.82 0.63 0.74  CALCIUM  --  9.5 8.7* 10.3  MG  --  1.9  --   --   TSH 16.80* 0.017*  --  0.02*   Liver Function Tests: Recent Labs    11/26/22 1521  AST 18  ALT 14  BILITOT 0.6  PROT 7.0   No results for input(s): "LIPASE", "AMYLASE" in the last 8760 hours. No results for input(s): "AMMONIA" in the last 8760 hours. CBC: Recent Labs    11/14/22 0055 11/15/22 0633 11/16/22 0412 11/17/22 0020 11/26/22 1521  WBC 8.7   < > 4.1 4.4 6.2  NEUTROABS 6.2  --   --   --  4,067  HGB 12.9   < > 12.4 11.9* 14.4  HCT 40.2   < > 38.8 36.4 43.8  MCV 87.8   < > 91.1 86.7 88.0  PLT 174   < > 125* 160 287   < > = values in this interval not displayed.   Lipid Panel: No results for input(s): "CHOL", "HDL", "LDLCALC", "TRIG", "CHOLHDL",  "LDLDIRECT" in the last 8760 hours. TSH: Recent Labs    06/24/22 0000 11/14/22 0055 11/26/22 1521  TSH 16.80* 0.017* 0.02*   A1C: Lab Results  Component Value Date   HGBA1C 7.3 (H) 11/14/2022     Assessment/Plan 1. Vascular dementia with behavior disturbance (HCC) - stable behaviors per daughter - dependent with ADLs except feeding - ambulates poorly on own - 30 lbs weight loss since 05/2022 - not eating well most days per daughter - Ambulatory referral to Hospice - Complete Metabolic Panel with eGFR - CBC with Differential/Platelet  2. Weight loss - see above  - Ambulatory referral to Hospice  3. Acquired hypothyroidism - TSH was 0.02 (08/01) > levothyroxine reduced to 88 mcg - TSH> 0.03 (09/26), T4 2.0 (08/01)  -  stop levothyroxine  - concerns for hyperthyroidism> will discuss with daughter>recommend endocrinology consult  - TSH- future  4. Abdominal pain, unspecified abdominal location - hypoactive bowel sounds to RLQ and LLQ, mild  distension - on oxycodone for chronic back pain - KUB to r/o constipation - CBC with Differential/Platelet - DG Abd 1 View; Future  5. Diarrhea, unspecified type - incontinent episode on stool in office today - see above  6. Type 2 diabetes mellitus with other specified complication, without long-term current use of insulin (HCC) - Recent A1c 7.3  7. Hypokalemia - suspect due to recent diarrhea - start KCL 20 meq po BID x 4 days - recheck bmp- future  Total time: 44 minutes. Greater than 50% of total time spent doing patient education regarding goals of care, weight loss, dementia, T2DM   Next appt: 03/04/2023  Hazle Nordmann, Juel Burrow  [facility] [phone]

## 2023-01-22 ENCOUNTER — Telehealth: Payer: Self-pay | Admitting: *Deleted

## 2023-01-22 ENCOUNTER — Ambulatory Visit
Admission: RE | Admit: 2023-01-22 | Discharge: 2023-01-22 | Disposition: A | Discharge status: Home / Self Care | Payer: Medicare Other | Source: Ambulatory Visit | Attending: Orthopedic Surgery | Admitting: Orthopedic Surgery

## 2023-01-22 DIAGNOSIS — R197 Diarrhea, unspecified: Secondary | ICD-10-CM | POA: Diagnosis not present

## 2023-01-22 DIAGNOSIS — R109 Unspecified abdominal pain: Secondary | ICD-10-CM | POA: Diagnosis not present

## 2023-01-22 DIAGNOSIS — M5136 Other intervertebral disc degeneration, lumbar region: Secondary | ICD-10-CM | POA: Diagnosis not present

## 2023-01-22 LAB — COMPLETE METABOLIC PANEL WITH GFR
AG Ratio: 1.4 (calc) (ref 1.0–2.5)
ALT: 11 U/L (ref 6–29)
AST: 18 U/L (ref 10–35)
Albumin: 3.8 g/dL (ref 3.6–5.1)
Alkaline phosphatase (APISO): 81 U/L (ref 37–153)
BUN: 8 mg/dL (ref 7–25)
CO2: 21 mmol/L (ref 20–32)
Calcium: 9.4 mg/dL (ref 8.6–10.4)
Chloride: 103 mmol/L (ref 98–110)
Creat: 0.67 mg/dL (ref 0.60–0.95)
Globulin: 2.7 g/dL (ref 1.9–3.7)
Glucose, Bld: 182 mg/dL — ABNORMAL HIGH (ref 65–99)
Potassium: 3.1 mmol/L — ABNORMAL LOW (ref 3.5–5.3)
Sodium: 137 mmol/L (ref 135–146)
Total Bilirubin: 0.7 mg/dL (ref 0.2–1.2)
Total Protein: 6.5 g/dL (ref 6.1–8.1)
eGFR: 88 mL/min/{1.73_m2} (ref >=60)

## 2023-01-22 LAB — TSH: TSH: 0.03 m[IU]/L — ABNORMAL LOW (ref 0.40–4.50)

## 2023-01-22 MED ORDER — POTASSIUM CHLORIDE CRYS ER 20 MEQ PO TBCR
20.0000 meq | EXTENDED_RELEASE_TABLET | Freq: Two times a day (BID) | ORAL | 0 refills | Status: DC
Start: 2023-01-22 — End: 2023-01-22

## 2023-01-22 MED ORDER — LEVOTHYROXINE SODIUM 25 MCG PO TABS: 25.0000 ug | ORAL_TABLET | Freq: Every day | ORAL | 3 refills | Status: DC

## 2023-01-22 MED ORDER — POTASSIUM CHLORIDE CRYS ER 20 MEQ PO TBCR
20.0000 meq | EXTENDED_RELEASE_TABLET | Freq: Two times a day (BID) | ORAL | 0 refills | Status: AC
Start: 2023-01-22 — End: 2023-01-26

## 2023-01-22 NOTE — Telephone Encounter (Signed)
Patient daughter called to discuss lab results. See lab results.

## 2023-01-29 ENCOUNTER — Other Ambulatory Visit: Payer: Medicare Other

## 2023-02-01 DIAGNOSIS — F0153 Vascular dementia, unspecified severity, with mood disturbance: Secondary | ICD-10-CM | POA: Diagnosis not present

## 2023-02-01 DIAGNOSIS — I672 Cerebral atherosclerosis: Secondary | ICD-10-CM | POA: Diagnosis not present

## 2023-02-01 DIAGNOSIS — R55 Syncope and collapse: Secondary | ICD-10-CM | POA: Diagnosis not present

## 2023-02-01 DIAGNOSIS — N390 Urinary tract infection, site not specified: Secondary | ICD-10-CM | POA: Diagnosis not present

## 2023-02-01 DIAGNOSIS — R634 Abnormal weight loss: Secondary | ICD-10-CM | POA: Diagnosis not present

## 2023-02-01 DIAGNOSIS — I10 Essential (primary) hypertension: Secondary | ICD-10-CM | POA: Diagnosis not present

## 2023-02-01 DIAGNOSIS — I441 Atrioventricular block, second degree: Secondary | ICD-10-CM | POA: Diagnosis not present

## 2023-02-01 DIAGNOSIS — K219 Gastro-esophageal reflux disease without esophagitis: Secondary | ICD-10-CM | POA: Diagnosis not present

## 2023-02-01 DIAGNOSIS — E785 Hyperlipidemia, unspecified: Secondary | ICD-10-CM | POA: Diagnosis not present

## 2023-02-01 DIAGNOSIS — F32A Depression, unspecified: Secondary | ICD-10-CM | POA: Diagnosis not present

## 2023-02-01 DIAGNOSIS — E1140 Type 2 diabetes mellitus with diabetic neuropathy, unspecified: Secondary | ICD-10-CM | POA: Diagnosis not present

## 2023-02-01 DIAGNOSIS — G9340 Encephalopathy, unspecified: Secondary | ICD-10-CM | POA: Diagnosis not present

## 2023-02-02 DIAGNOSIS — R634 Abnormal weight loss: Secondary | ICD-10-CM | POA: Diagnosis not present

## 2023-02-02 DIAGNOSIS — I672 Cerebral atherosclerosis: Secondary | ICD-10-CM | POA: Diagnosis not present

## 2023-02-02 DIAGNOSIS — G9340 Encephalopathy, unspecified: Secondary | ICD-10-CM | POA: Diagnosis not present

## 2023-02-02 DIAGNOSIS — I10 Essential (primary) hypertension: Secondary | ICD-10-CM | POA: Diagnosis not present

## 2023-02-02 DIAGNOSIS — F0153 Vascular dementia, unspecified severity, with mood disturbance: Secondary | ICD-10-CM | POA: Diagnosis not present

## 2023-02-02 DIAGNOSIS — E1140 Type 2 diabetes mellitus with diabetic neuropathy, unspecified: Secondary | ICD-10-CM | POA: Diagnosis not present

## 2023-02-03 DIAGNOSIS — E1140 Type 2 diabetes mellitus with diabetic neuropathy, unspecified: Secondary | ICD-10-CM | POA: Diagnosis not present

## 2023-02-03 DIAGNOSIS — I672 Cerebral atherosclerosis: Secondary | ICD-10-CM | POA: Diagnosis not present

## 2023-02-03 DIAGNOSIS — I10 Essential (primary) hypertension: Secondary | ICD-10-CM | POA: Diagnosis not present

## 2023-02-03 DIAGNOSIS — R634 Abnormal weight loss: Secondary | ICD-10-CM | POA: Diagnosis not present

## 2023-02-03 DIAGNOSIS — F0153 Vascular dementia, unspecified severity, with mood disturbance: Secondary | ICD-10-CM | POA: Diagnosis not present

## 2023-02-03 DIAGNOSIS — G9340 Encephalopathy, unspecified: Secondary | ICD-10-CM | POA: Diagnosis not present

## 2023-02-04 DIAGNOSIS — F0153 Vascular dementia, unspecified severity, with mood disturbance: Secondary | ICD-10-CM | POA: Diagnosis not present

## 2023-02-04 DIAGNOSIS — I672 Cerebral atherosclerosis: Secondary | ICD-10-CM | POA: Diagnosis not present

## 2023-02-04 DIAGNOSIS — E1140 Type 2 diabetes mellitus with diabetic neuropathy, unspecified: Secondary | ICD-10-CM | POA: Diagnosis not present

## 2023-02-04 DIAGNOSIS — I10 Essential (primary) hypertension: Secondary | ICD-10-CM | POA: Diagnosis not present

## 2023-02-04 DIAGNOSIS — R634 Abnormal weight loss: Secondary | ICD-10-CM | POA: Diagnosis not present

## 2023-02-04 DIAGNOSIS — G9340 Encephalopathy, unspecified: Secondary | ICD-10-CM | POA: Diagnosis not present

## 2023-02-05 DIAGNOSIS — I10 Essential (primary) hypertension: Secondary | ICD-10-CM | POA: Diagnosis not present

## 2023-02-05 DIAGNOSIS — G9340 Encephalopathy, unspecified: Secondary | ICD-10-CM | POA: Diagnosis not present

## 2023-02-05 DIAGNOSIS — E1140 Type 2 diabetes mellitus with diabetic neuropathy, unspecified: Secondary | ICD-10-CM | POA: Diagnosis not present

## 2023-02-05 DIAGNOSIS — R634 Abnormal weight loss: Secondary | ICD-10-CM | POA: Diagnosis not present

## 2023-02-05 DIAGNOSIS — I672 Cerebral atherosclerosis: Secondary | ICD-10-CM | POA: Diagnosis not present

## 2023-02-05 DIAGNOSIS — F0153 Vascular dementia, unspecified severity, with mood disturbance: Secondary | ICD-10-CM | POA: Diagnosis not present

## 2023-02-06 DIAGNOSIS — R634 Abnormal weight loss: Secondary | ICD-10-CM | POA: Diagnosis not present

## 2023-02-06 DIAGNOSIS — E1140 Type 2 diabetes mellitus with diabetic neuropathy, unspecified: Secondary | ICD-10-CM | POA: Diagnosis not present

## 2023-02-06 DIAGNOSIS — I10 Essential (primary) hypertension: Secondary | ICD-10-CM | POA: Diagnosis not present

## 2023-02-06 DIAGNOSIS — I672 Cerebral atherosclerosis: Secondary | ICD-10-CM | POA: Diagnosis not present

## 2023-02-06 DIAGNOSIS — G9340 Encephalopathy, unspecified: Secondary | ICD-10-CM | POA: Diagnosis not present

## 2023-02-06 DIAGNOSIS — F0153 Vascular dementia, unspecified severity, with mood disturbance: Secondary | ICD-10-CM | POA: Diagnosis not present

## 2023-02-07 DIAGNOSIS — G9340 Encephalopathy, unspecified: Secondary | ICD-10-CM | POA: Diagnosis not present

## 2023-02-07 DIAGNOSIS — E1140 Type 2 diabetes mellitus with diabetic neuropathy, unspecified: Secondary | ICD-10-CM | POA: Diagnosis not present

## 2023-02-07 DIAGNOSIS — I672 Cerebral atherosclerosis: Secondary | ICD-10-CM | POA: Diagnosis not present

## 2023-02-07 DIAGNOSIS — I10 Essential (primary) hypertension: Secondary | ICD-10-CM | POA: Diagnosis not present

## 2023-02-07 DIAGNOSIS — R634 Abnormal weight loss: Secondary | ICD-10-CM | POA: Diagnosis not present

## 2023-02-07 DIAGNOSIS — F0153 Vascular dementia, unspecified severity, with mood disturbance: Secondary | ICD-10-CM | POA: Diagnosis not present

## 2023-02-08 DIAGNOSIS — I10 Essential (primary) hypertension: Secondary | ICD-10-CM | POA: Diagnosis not present

## 2023-02-08 DIAGNOSIS — R634 Abnormal weight loss: Secondary | ICD-10-CM | POA: Diagnosis not present

## 2023-02-08 DIAGNOSIS — I672 Cerebral atherosclerosis: Secondary | ICD-10-CM | POA: Diagnosis not present

## 2023-02-08 DIAGNOSIS — G9340 Encephalopathy, unspecified: Secondary | ICD-10-CM | POA: Diagnosis not present

## 2023-02-08 DIAGNOSIS — F0153 Vascular dementia, unspecified severity, with mood disturbance: Secondary | ICD-10-CM | POA: Diagnosis not present

## 2023-02-08 DIAGNOSIS — E1140 Type 2 diabetes mellitus with diabetic neuropathy, unspecified: Secondary | ICD-10-CM | POA: Diagnosis not present

## 2023-02-09 DIAGNOSIS — F0153 Vascular dementia, unspecified severity, with mood disturbance: Secondary | ICD-10-CM | POA: Diagnosis not present

## 2023-02-09 DIAGNOSIS — G9340 Encephalopathy, unspecified: Secondary | ICD-10-CM | POA: Diagnosis not present

## 2023-02-09 DIAGNOSIS — R634 Abnormal weight loss: Secondary | ICD-10-CM | POA: Diagnosis not present

## 2023-02-09 DIAGNOSIS — I10 Essential (primary) hypertension: Secondary | ICD-10-CM | POA: Diagnosis not present

## 2023-02-09 DIAGNOSIS — I672 Cerebral atherosclerosis: Secondary | ICD-10-CM | POA: Diagnosis not present

## 2023-02-09 DIAGNOSIS — E1140 Type 2 diabetes mellitus with diabetic neuropathy, unspecified: Secondary | ICD-10-CM | POA: Diagnosis not present

## 2023-02-10 DIAGNOSIS — R634 Abnormal weight loss: Secondary | ICD-10-CM | POA: Diagnosis not present

## 2023-02-10 DIAGNOSIS — G9340 Encephalopathy, unspecified: Secondary | ICD-10-CM | POA: Diagnosis not present

## 2023-02-10 DIAGNOSIS — I10 Essential (primary) hypertension: Secondary | ICD-10-CM | POA: Diagnosis not present

## 2023-02-10 DIAGNOSIS — F0153 Vascular dementia, unspecified severity, with mood disturbance: Secondary | ICD-10-CM | POA: Diagnosis not present

## 2023-02-10 DIAGNOSIS — E1140 Type 2 diabetes mellitus with diabetic neuropathy, unspecified: Secondary | ICD-10-CM | POA: Diagnosis not present

## 2023-02-10 DIAGNOSIS — I672 Cerebral atherosclerosis: Secondary | ICD-10-CM | POA: Diagnosis not present

## 2023-02-11 DIAGNOSIS — F0153 Vascular dementia, unspecified severity, with mood disturbance: Secondary | ICD-10-CM | POA: Diagnosis not present

## 2023-02-11 DIAGNOSIS — I10 Essential (primary) hypertension: Secondary | ICD-10-CM | POA: Diagnosis not present

## 2023-02-11 DIAGNOSIS — I672 Cerebral atherosclerosis: Secondary | ICD-10-CM | POA: Diagnosis not present

## 2023-02-11 DIAGNOSIS — G9340 Encephalopathy, unspecified: Secondary | ICD-10-CM | POA: Diagnosis not present

## 2023-02-11 DIAGNOSIS — R634 Abnormal weight loss: Secondary | ICD-10-CM | POA: Diagnosis not present

## 2023-02-11 DIAGNOSIS — E1140 Type 2 diabetes mellitus with diabetic neuropathy, unspecified: Secondary | ICD-10-CM | POA: Diagnosis not present

## 2023-02-12 DIAGNOSIS — I10 Essential (primary) hypertension: Secondary | ICD-10-CM | POA: Diagnosis not present

## 2023-02-12 DIAGNOSIS — I672 Cerebral atherosclerosis: Secondary | ICD-10-CM | POA: Diagnosis not present

## 2023-02-12 DIAGNOSIS — E1140 Type 2 diabetes mellitus with diabetic neuropathy, unspecified: Secondary | ICD-10-CM | POA: Diagnosis not present

## 2023-02-12 DIAGNOSIS — R634 Abnormal weight loss: Secondary | ICD-10-CM | POA: Diagnosis not present

## 2023-02-12 DIAGNOSIS — G9340 Encephalopathy, unspecified: Secondary | ICD-10-CM | POA: Diagnosis not present

## 2023-02-12 DIAGNOSIS — F0153 Vascular dementia, unspecified severity, with mood disturbance: Secondary | ICD-10-CM | POA: Diagnosis not present

## 2023-02-13 DIAGNOSIS — R634 Abnormal weight loss: Secondary | ICD-10-CM | POA: Diagnosis not present

## 2023-02-13 DIAGNOSIS — I672 Cerebral atherosclerosis: Secondary | ICD-10-CM | POA: Diagnosis not present

## 2023-02-13 DIAGNOSIS — G9340 Encephalopathy, unspecified: Secondary | ICD-10-CM | POA: Diagnosis not present

## 2023-02-13 DIAGNOSIS — F0153 Vascular dementia, unspecified severity, with mood disturbance: Secondary | ICD-10-CM | POA: Diagnosis not present

## 2023-02-13 DIAGNOSIS — E1140 Type 2 diabetes mellitus with diabetic neuropathy, unspecified: Secondary | ICD-10-CM | POA: Diagnosis not present

## 2023-02-13 DIAGNOSIS — I10 Essential (primary) hypertension: Secondary | ICD-10-CM | POA: Diagnosis not present

## 2023-02-14 DIAGNOSIS — G9340 Encephalopathy, unspecified: Secondary | ICD-10-CM | POA: Diagnosis not present

## 2023-02-14 DIAGNOSIS — E1140 Type 2 diabetes mellitus with diabetic neuropathy, unspecified: Secondary | ICD-10-CM | POA: Diagnosis not present

## 2023-02-14 DIAGNOSIS — I10 Essential (primary) hypertension: Secondary | ICD-10-CM | POA: Diagnosis not present

## 2023-02-14 DIAGNOSIS — R634 Abnormal weight loss: Secondary | ICD-10-CM | POA: Diagnosis not present

## 2023-02-14 DIAGNOSIS — I672 Cerebral atherosclerosis: Secondary | ICD-10-CM | POA: Diagnosis not present

## 2023-02-14 DIAGNOSIS — F0153 Vascular dementia, unspecified severity, with mood disturbance: Secondary | ICD-10-CM | POA: Diagnosis not present

## 2023-02-15 DIAGNOSIS — F0153 Vascular dementia, unspecified severity, with mood disturbance: Secondary | ICD-10-CM | POA: Diagnosis not present

## 2023-02-15 DIAGNOSIS — R634 Abnormal weight loss: Secondary | ICD-10-CM | POA: Diagnosis not present

## 2023-02-15 DIAGNOSIS — G9340 Encephalopathy, unspecified: Secondary | ICD-10-CM | POA: Diagnosis not present

## 2023-02-15 DIAGNOSIS — I672 Cerebral atherosclerosis: Secondary | ICD-10-CM | POA: Diagnosis not present

## 2023-02-15 DIAGNOSIS — E1140 Type 2 diabetes mellitus with diabetic neuropathy, unspecified: Secondary | ICD-10-CM | POA: Diagnosis not present

## 2023-02-15 DIAGNOSIS — I10 Essential (primary) hypertension: Secondary | ICD-10-CM | POA: Diagnosis not present

## 2023-02-16 DIAGNOSIS — I10 Essential (primary) hypertension: Secondary | ICD-10-CM | POA: Diagnosis not present

## 2023-02-16 DIAGNOSIS — R634 Abnormal weight loss: Secondary | ICD-10-CM | POA: Diagnosis not present

## 2023-02-16 DIAGNOSIS — F0153 Vascular dementia, unspecified severity, with mood disturbance: Secondary | ICD-10-CM | POA: Diagnosis not present

## 2023-02-16 DIAGNOSIS — I672 Cerebral atherosclerosis: Secondary | ICD-10-CM | POA: Diagnosis not present

## 2023-02-16 DIAGNOSIS — G9340 Encephalopathy, unspecified: Secondary | ICD-10-CM | POA: Diagnosis not present

## 2023-02-16 DIAGNOSIS — E1140 Type 2 diabetes mellitus with diabetic neuropathy, unspecified: Secondary | ICD-10-CM | POA: Diagnosis not present

## 2023-02-17 DIAGNOSIS — E1140 Type 2 diabetes mellitus with diabetic neuropathy, unspecified: Secondary | ICD-10-CM | POA: Diagnosis not present

## 2023-02-17 DIAGNOSIS — F0153 Vascular dementia, unspecified severity, with mood disturbance: Secondary | ICD-10-CM | POA: Diagnosis not present

## 2023-02-17 DIAGNOSIS — R634 Abnormal weight loss: Secondary | ICD-10-CM | POA: Diagnosis not present

## 2023-02-17 DIAGNOSIS — G9340 Encephalopathy, unspecified: Secondary | ICD-10-CM | POA: Diagnosis not present

## 2023-02-17 DIAGNOSIS — I672 Cerebral atherosclerosis: Secondary | ICD-10-CM | POA: Diagnosis not present

## 2023-02-17 DIAGNOSIS — I10 Essential (primary) hypertension: Secondary | ICD-10-CM | POA: Diagnosis not present

## 2023-02-18 DIAGNOSIS — G9340 Encephalopathy, unspecified: Secondary | ICD-10-CM | POA: Diagnosis not present

## 2023-02-18 DIAGNOSIS — I672 Cerebral atherosclerosis: Secondary | ICD-10-CM | POA: Diagnosis not present

## 2023-02-18 DIAGNOSIS — I10 Essential (primary) hypertension: Secondary | ICD-10-CM | POA: Diagnosis not present

## 2023-02-18 DIAGNOSIS — R634 Abnormal weight loss: Secondary | ICD-10-CM | POA: Diagnosis not present

## 2023-02-18 DIAGNOSIS — E1140 Type 2 diabetes mellitus with diabetic neuropathy, unspecified: Secondary | ICD-10-CM | POA: Diagnosis not present

## 2023-02-18 DIAGNOSIS — F0153 Vascular dementia, unspecified severity, with mood disturbance: Secondary | ICD-10-CM | POA: Diagnosis not present

## 2023-02-19 DIAGNOSIS — I672 Cerebral atherosclerosis: Secondary | ICD-10-CM | POA: Diagnosis not present

## 2023-02-19 DIAGNOSIS — I10 Essential (primary) hypertension: Secondary | ICD-10-CM | POA: Diagnosis not present

## 2023-02-19 DIAGNOSIS — R634 Abnormal weight loss: Secondary | ICD-10-CM | POA: Diagnosis not present

## 2023-02-19 DIAGNOSIS — E1140 Type 2 diabetes mellitus with diabetic neuropathy, unspecified: Secondary | ICD-10-CM | POA: Diagnosis not present

## 2023-02-19 DIAGNOSIS — G9340 Encephalopathy, unspecified: Secondary | ICD-10-CM | POA: Diagnosis not present

## 2023-02-19 DIAGNOSIS — F0153 Vascular dementia, unspecified severity, with mood disturbance: Secondary | ICD-10-CM | POA: Diagnosis not present

## 2023-02-20 DIAGNOSIS — G9340 Encephalopathy, unspecified: Secondary | ICD-10-CM | POA: Diagnosis not present

## 2023-02-20 DIAGNOSIS — F0153 Vascular dementia, unspecified severity, with mood disturbance: Secondary | ICD-10-CM | POA: Diagnosis not present

## 2023-02-20 DIAGNOSIS — E1140 Type 2 diabetes mellitus with diabetic neuropathy, unspecified: Secondary | ICD-10-CM | POA: Diagnosis not present

## 2023-02-20 DIAGNOSIS — I672 Cerebral atherosclerosis: Secondary | ICD-10-CM | POA: Diagnosis not present

## 2023-02-20 DIAGNOSIS — I10 Essential (primary) hypertension: Secondary | ICD-10-CM | POA: Diagnosis not present

## 2023-02-20 DIAGNOSIS — R634 Abnormal weight loss: Secondary | ICD-10-CM | POA: Diagnosis not present

## 2023-02-21 DIAGNOSIS — I10 Essential (primary) hypertension: Secondary | ICD-10-CM | POA: Diagnosis not present

## 2023-02-21 DIAGNOSIS — G9340 Encephalopathy, unspecified: Secondary | ICD-10-CM | POA: Diagnosis not present

## 2023-02-21 DIAGNOSIS — I672 Cerebral atherosclerosis: Secondary | ICD-10-CM | POA: Diagnosis not present

## 2023-02-21 DIAGNOSIS — R634 Abnormal weight loss: Secondary | ICD-10-CM | POA: Diagnosis not present

## 2023-02-21 DIAGNOSIS — E1140 Type 2 diabetes mellitus with diabetic neuropathy, unspecified: Secondary | ICD-10-CM | POA: Diagnosis not present

## 2023-02-21 DIAGNOSIS — F0153 Vascular dementia, unspecified severity, with mood disturbance: Secondary | ICD-10-CM | POA: Diagnosis not present

## 2023-02-22 DIAGNOSIS — R634 Abnormal weight loss: Secondary | ICD-10-CM | POA: Diagnosis not present

## 2023-02-22 DIAGNOSIS — I10 Essential (primary) hypertension: Secondary | ICD-10-CM | POA: Diagnosis not present

## 2023-02-22 DIAGNOSIS — F0153 Vascular dementia, unspecified severity, with mood disturbance: Secondary | ICD-10-CM | POA: Diagnosis not present

## 2023-02-22 DIAGNOSIS — I672 Cerebral atherosclerosis: Secondary | ICD-10-CM | POA: Diagnosis not present

## 2023-02-22 DIAGNOSIS — G9340 Encephalopathy, unspecified: Secondary | ICD-10-CM | POA: Diagnosis not present

## 2023-02-22 DIAGNOSIS — E1140 Type 2 diabetes mellitus with diabetic neuropathy, unspecified: Secondary | ICD-10-CM | POA: Diagnosis not present

## 2023-02-23 ENCOUNTER — Other Ambulatory Visit: Payer: Self-pay | Admitting: Orthopedic Surgery

## 2023-02-23 DIAGNOSIS — I672 Cerebral atherosclerosis: Secondary | ICD-10-CM | POA: Diagnosis not present

## 2023-02-23 DIAGNOSIS — R634 Abnormal weight loss: Secondary | ICD-10-CM | POA: Diagnosis not present

## 2023-02-23 DIAGNOSIS — E1140 Type 2 diabetes mellitus with diabetic neuropathy, unspecified: Secondary | ICD-10-CM | POA: Diagnosis not present

## 2023-02-23 DIAGNOSIS — I10 Essential (primary) hypertension: Secondary | ICD-10-CM | POA: Diagnosis not present

## 2023-02-23 DIAGNOSIS — G9340 Encephalopathy, unspecified: Secondary | ICD-10-CM | POA: Diagnosis not present

## 2023-02-23 DIAGNOSIS — F0153 Vascular dementia, unspecified severity, with mood disturbance: Secondary | ICD-10-CM | POA: Diagnosis not present

## 2023-02-23 DIAGNOSIS — F01518 Vascular dementia, unspecified severity, with other behavioral disturbance: Secondary | ICD-10-CM

## 2023-02-24 DIAGNOSIS — I672 Cerebral atherosclerosis: Secondary | ICD-10-CM | POA: Diagnosis not present

## 2023-02-24 DIAGNOSIS — G9340 Encephalopathy, unspecified: Secondary | ICD-10-CM | POA: Diagnosis not present

## 2023-02-24 DIAGNOSIS — E1140 Type 2 diabetes mellitus with diabetic neuropathy, unspecified: Secondary | ICD-10-CM | POA: Diagnosis not present

## 2023-02-24 DIAGNOSIS — R634 Abnormal weight loss: Secondary | ICD-10-CM | POA: Diagnosis not present

## 2023-02-24 DIAGNOSIS — F0153 Vascular dementia, unspecified severity, with mood disturbance: Secondary | ICD-10-CM | POA: Diagnosis not present

## 2023-02-24 DIAGNOSIS — I10 Essential (primary) hypertension: Secondary | ICD-10-CM | POA: Diagnosis not present

## 2023-02-25 DIAGNOSIS — I10 Essential (primary) hypertension: Secondary | ICD-10-CM | POA: Diagnosis not present

## 2023-02-25 DIAGNOSIS — F0153 Vascular dementia, unspecified severity, with mood disturbance: Secondary | ICD-10-CM | POA: Diagnosis not present

## 2023-02-25 DIAGNOSIS — R634 Abnormal weight loss: Secondary | ICD-10-CM | POA: Diagnosis not present

## 2023-02-25 DIAGNOSIS — I672 Cerebral atherosclerosis: Secondary | ICD-10-CM | POA: Diagnosis not present

## 2023-02-25 DIAGNOSIS — G9340 Encephalopathy, unspecified: Secondary | ICD-10-CM | POA: Diagnosis not present

## 2023-02-25 DIAGNOSIS — E1140 Type 2 diabetes mellitus with diabetic neuropathy, unspecified: Secondary | ICD-10-CM | POA: Diagnosis not present

## 2023-02-26 DIAGNOSIS — E785 Hyperlipidemia, unspecified: Secondary | ICD-10-CM | POA: Diagnosis not present

## 2023-02-26 DIAGNOSIS — F0153 Vascular dementia, unspecified severity, with mood disturbance: Secondary | ICD-10-CM | POA: Diagnosis not present

## 2023-02-26 DIAGNOSIS — R55 Syncope and collapse: Secondary | ICD-10-CM | POA: Diagnosis not present

## 2023-02-26 DIAGNOSIS — R634 Abnormal weight loss: Secondary | ICD-10-CM | POA: Diagnosis not present

## 2023-02-26 DIAGNOSIS — K219 Gastro-esophageal reflux disease without esophagitis: Secondary | ICD-10-CM | POA: Diagnosis not present

## 2023-02-26 DIAGNOSIS — F32A Depression, unspecified: Secondary | ICD-10-CM | POA: Diagnosis not present

## 2023-02-26 DIAGNOSIS — G9340 Encephalopathy, unspecified: Secondary | ICD-10-CM | POA: Diagnosis not present

## 2023-02-26 DIAGNOSIS — E1140 Type 2 diabetes mellitus with diabetic neuropathy, unspecified: Secondary | ICD-10-CM | POA: Diagnosis not present

## 2023-02-26 DIAGNOSIS — I441 Atrioventricular block, second degree: Secondary | ICD-10-CM | POA: Diagnosis not present

## 2023-02-26 DIAGNOSIS — I672 Cerebral atherosclerosis: Secondary | ICD-10-CM | POA: Diagnosis not present

## 2023-02-26 DIAGNOSIS — N390 Urinary tract infection, site not specified: Secondary | ICD-10-CM | POA: Diagnosis not present

## 2023-02-26 DIAGNOSIS — I10 Essential (primary) hypertension: Secondary | ICD-10-CM | POA: Diagnosis not present

## 2023-03-02 DIAGNOSIS — R634 Abnormal weight loss: Secondary | ICD-10-CM | POA: Diagnosis not present

## 2023-03-02 DIAGNOSIS — I672 Cerebral atherosclerosis: Secondary | ICD-10-CM | POA: Diagnosis not present

## 2023-03-02 DIAGNOSIS — G9340 Encephalopathy, unspecified: Secondary | ICD-10-CM | POA: Diagnosis not present

## 2023-03-02 DIAGNOSIS — F0153 Vascular dementia, unspecified severity, with mood disturbance: Secondary | ICD-10-CM | POA: Diagnosis not present

## 2023-03-02 DIAGNOSIS — I10 Essential (primary) hypertension: Secondary | ICD-10-CM | POA: Diagnosis not present

## 2023-03-02 DIAGNOSIS — E1140 Type 2 diabetes mellitus with diabetic neuropathy, unspecified: Secondary | ICD-10-CM | POA: Diagnosis not present

## 2023-03-03 DIAGNOSIS — R634 Abnormal weight loss: Secondary | ICD-10-CM | POA: Diagnosis not present

## 2023-03-03 DIAGNOSIS — F0153 Vascular dementia, unspecified severity, with mood disturbance: Secondary | ICD-10-CM | POA: Diagnosis not present

## 2023-03-03 DIAGNOSIS — I10 Essential (primary) hypertension: Secondary | ICD-10-CM | POA: Diagnosis not present

## 2023-03-03 DIAGNOSIS — E1140 Type 2 diabetes mellitus with diabetic neuropathy, unspecified: Secondary | ICD-10-CM | POA: Diagnosis not present

## 2023-03-03 DIAGNOSIS — I672 Cerebral atherosclerosis: Secondary | ICD-10-CM | POA: Diagnosis not present

## 2023-03-03 DIAGNOSIS — G9340 Encephalopathy, unspecified: Secondary | ICD-10-CM | POA: Diagnosis not present

## 2023-03-04 ENCOUNTER — Encounter (INDEPENDENT_AMBULATORY_CARE_PROVIDER_SITE_OTHER): Payer: Medicare Other | Admitting: Orthopedic Surgery

## 2023-03-05 NOTE — Progress Notes (Signed)
This encounter was created in error - please disregard.

## 2023-03-06 DIAGNOSIS — I10 Essential (primary) hypertension: Secondary | ICD-10-CM | POA: Diagnosis not present

## 2023-03-06 DIAGNOSIS — R634 Abnormal weight loss: Secondary | ICD-10-CM | POA: Diagnosis not present

## 2023-03-06 DIAGNOSIS — E1140 Type 2 diabetes mellitus with diabetic neuropathy, unspecified: Secondary | ICD-10-CM | POA: Diagnosis not present

## 2023-03-06 DIAGNOSIS — I672 Cerebral atherosclerosis: Secondary | ICD-10-CM | POA: Diagnosis not present

## 2023-03-06 DIAGNOSIS — G9340 Encephalopathy, unspecified: Secondary | ICD-10-CM | POA: Diagnosis not present

## 2023-03-06 DIAGNOSIS — F0153 Vascular dementia, unspecified severity, with mood disturbance: Secondary | ICD-10-CM | POA: Diagnosis not present

## 2023-03-09 DIAGNOSIS — E1140 Type 2 diabetes mellitus with diabetic neuropathy, unspecified: Secondary | ICD-10-CM | POA: Diagnosis not present

## 2023-03-09 DIAGNOSIS — F0153 Vascular dementia, unspecified severity, with mood disturbance: Secondary | ICD-10-CM | POA: Diagnosis not present

## 2023-03-09 DIAGNOSIS — I10 Essential (primary) hypertension: Secondary | ICD-10-CM | POA: Diagnosis not present

## 2023-03-09 DIAGNOSIS — I672 Cerebral atherosclerosis: Secondary | ICD-10-CM | POA: Diagnosis not present

## 2023-03-09 DIAGNOSIS — G9340 Encephalopathy, unspecified: Secondary | ICD-10-CM | POA: Diagnosis not present

## 2023-03-09 DIAGNOSIS — R634 Abnormal weight loss: Secondary | ICD-10-CM | POA: Diagnosis not present

## 2023-03-16 DIAGNOSIS — F0153 Vascular dementia, unspecified severity, with mood disturbance: Secondary | ICD-10-CM | POA: Diagnosis not present

## 2023-03-16 DIAGNOSIS — I672 Cerebral atherosclerosis: Secondary | ICD-10-CM | POA: Diagnosis not present

## 2023-03-16 DIAGNOSIS — G9340 Encephalopathy, unspecified: Secondary | ICD-10-CM | POA: Diagnosis not present

## 2023-03-16 DIAGNOSIS — I10 Essential (primary) hypertension: Secondary | ICD-10-CM | POA: Diagnosis not present

## 2023-03-16 DIAGNOSIS — E1140 Type 2 diabetes mellitus with diabetic neuropathy, unspecified: Secondary | ICD-10-CM | POA: Diagnosis not present

## 2023-03-16 DIAGNOSIS — R634 Abnormal weight loss: Secondary | ICD-10-CM | POA: Diagnosis not present

## 2023-03-17 DIAGNOSIS — G9340 Encephalopathy, unspecified: Secondary | ICD-10-CM | POA: Diagnosis not present

## 2023-03-17 DIAGNOSIS — E1140 Type 2 diabetes mellitus with diabetic neuropathy, unspecified: Secondary | ICD-10-CM | POA: Diagnosis not present

## 2023-03-17 DIAGNOSIS — F0153 Vascular dementia, unspecified severity, with mood disturbance: Secondary | ICD-10-CM | POA: Diagnosis not present

## 2023-03-17 DIAGNOSIS — I672 Cerebral atherosclerosis: Secondary | ICD-10-CM | POA: Diagnosis not present

## 2023-03-17 DIAGNOSIS — I10 Essential (primary) hypertension: Secondary | ICD-10-CM | POA: Diagnosis not present

## 2023-03-17 DIAGNOSIS — R634 Abnormal weight loss: Secondary | ICD-10-CM | POA: Diagnosis not present

## 2023-03-18 DIAGNOSIS — I10 Essential (primary) hypertension: Secondary | ICD-10-CM | POA: Diagnosis not present

## 2023-03-18 DIAGNOSIS — G9340 Encephalopathy, unspecified: Secondary | ICD-10-CM | POA: Diagnosis not present

## 2023-03-18 DIAGNOSIS — R634 Abnormal weight loss: Secondary | ICD-10-CM | POA: Diagnosis not present

## 2023-03-18 DIAGNOSIS — F0153 Vascular dementia, unspecified severity, with mood disturbance: Secondary | ICD-10-CM | POA: Diagnosis not present

## 2023-03-18 DIAGNOSIS — I672 Cerebral atherosclerosis: Secondary | ICD-10-CM | POA: Diagnosis not present

## 2023-03-18 DIAGNOSIS — E1140 Type 2 diabetes mellitus with diabetic neuropathy, unspecified: Secondary | ICD-10-CM | POA: Diagnosis not present

## 2023-03-21 DIAGNOSIS — F0153 Vascular dementia, unspecified severity, with mood disturbance: Secondary | ICD-10-CM | POA: Diagnosis not present

## 2023-03-21 DIAGNOSIS — I672 Cerebral atherosclerosis: Secondary | ICD-10-CM | POA: Diagnosis not present

## 2023-03-21 DIAGNOSIS — I10 Essential (primary) hypertension: Secondary | ICD-10-CM | POA: Diagnosis not present

## 2023-03-21 DIAGNOSIS — R634 Abnormal weight loss: Secondary | ICD-10-CM | POA: Diagnosis not present

## 2023-03-21 DIAGNOSIS — G9340 Encephalopathy, unspecified: Secondary | ICD-10-CM | POA: Diagnosis not present

## 2023-03-21 DIAGNOSIS — E1140 Type 2 diabetes mellitus with diabetic neuropathy, unspecified: Secondary | ICD-10-CM | POA: Diagnosis not present

## 2023-03-22 ENCOUNTER — Other Ambulatory Visit: Payer: Self-pay | Admitting: Orthopedic Surgery

## 2023-03-22 DIAGNOSIS — I672 Cerebral atherosclerosis: Secondary | ICD-10-CM | POA: Diagnosis not present

## 2023-03-22 DIAGNOSIS — F0153 Vascular dementia, unspecified severity, with mood disturbance: Secondary | ICD-10-CM | POA: Diagnosis not present

## 2023-03-22 DIAGNOSIS — R634 Abnormal weight loss: Secondary | ICD-10-CM | POA: Diagnosis not present

## 2023-03-22 DIAGNOSIS — G9340 Encephalopathy, unspecified: Secondary | ICD-10-CM | POA: Diagnosis not present

## 2023-03-22 DIAGNOSIS — I10 Essential (primary) hypertension: Secondary | ICD-10-CM | POA: Diagnosis not present

## 2023-03-22 DIAGNOSIS — E1140 Type 2 diabetes mellitus with diabetic neuropathy, unspecified: Secondary | ICD-10-CM | POA: Diagnosis not present

## 2023-03-23 DIAGNOSIS — I10 Essential (primary) hypertension: Secondary | ICD-10-CM | POA: Diagnosis not present

## 2023-03-23 DIAGNOSIS — F0153 Vascular dementia, unspecified severity, with mood disturbance: Secondary | ICD-10-CM | POA: Diagnosis not present

## 2023-03-23 DIAGNOSIS — G9340 Encephalopathy, unspecified: Secondary | ICD-10-CM | POA: Diagnosis not present

## 2023-03-23 DIAGNOSIS — R634 Abnormal weight loss: Secondary | ICD-10-CM | POA: Diagnosis not present

## 2023-03-23 DIAGNOSIS — E1140 Type 2 diabetes mellitus with diabetic neuropathy, unspecified: Secondary | ICD-10-CM | POA: Diagnosis not present

## 2023-03-23 DIAGNOSIS — I672 Cerebral atherosclerosis: Secondary | ICD-10-CM | POA: Diagnosis not present

## 2023-03-24 DIAGNOSIS — G9340 Encephalopathy, unspecified: Secondary | ICD-10-CM | POA: Diagnosis not present

## 2023-03-24 DIAGNOSIS — F0153 Vascular dementia, unspecified severity, with mood disturbance: Secondary | ICD-10-CM | POA: Diagnosis not present

## 2023-03-24 DIAGNOSIS — R634 Abnormal weight loss: Secondary | ICD-10-CM | POA: Diagnosis not present

## 2023-03-24 DIAGNOSIS — I10 Essential (primary) hypertension: Secondary | ICD-10-CM | POA: Diagnosis not present

## 2023-03-24 DIAGNOSIS — I672 Cerebral atherosclerosis: Secondary | ICD-10-CM | POA: Diagnosis not present

## 2023-03-24 DIAGNOSIS — E1140 Type 2 diabetes mellitus with diabetic neuropathy, unspecified: Secondary | ICD-10-CM | POA: Diagnosis not present

## 2023-03-25 DIAGNOSIS — I672 Cerebral atherosclerosis: Secondary | ICD-10-CM | POA: Diagnosis not present

## 2023-03-25 DIAGNOSIS — I10 Essential (primary) hypertension: Secondary | ICD-10-CM | POA: Diagnosis not present

## 2023-03-25 DIAGNOSIS — G9340 Encephalopathy, unspecified: Secondary | ICD-10-CM | POA: Diagnosis not present

## 2023-03-25 DIAGNOSIS — R634 Abnormal weight loss: Secondary | ICD-10-CM | POA: Diagnosis not present

## 2023-03-25 DIAGNOSIS — E1140 Type 2 diabetes mellitus with diabetic neuropathy, unspecified: Secondary | ICD-10-CM | POA: Diagnosis not present

## 2023-03-25 DIAGNOSIS — F0153 Vascular dementia, unspecified severity, with mood disturbance: Secondary | ICD-10-CM | POA: Diagnosis not present

## 2023-03-26 DIAGNOSIS — R634 Abnormal weight loss: Secondary | ICD-10-CM | POA: Diagnosis not present

## 2023-03-26 DIAGNOSIS — G9340 Encephalopathy, unspecified: Secondary | ICD-10-CM | POA: Diagnosis not present

## 2023-03-26 DIAGNOSIS — I10 Essential (primary) hypertension: Secondary | ICD-10-CM | POA: Diagnosis not present

## 2023-03-26 DIAGNOSIS — I672 Cerebral atherosclerosis: Secondary | ICD-10-CM | POA: Diagnosis not present

## 2023-03-26 DIAGNOSIS — E1140 Type 2 diabetes mellitus with diabetic neuropathy, unspecified: Secondary | ICD-10-CM | POA: Diagnosis not present

## 2023-03-26 DIAGNOSIS — F0153 Vascular dementia, unspecified severity, with mood disturbance: Secondary | ICD-10-CM | POA: Diagnosis not present

## 2023-03-27 DIAGNOSIS — G9340 Encephalopathy, unspecified: Secondary | ICD-10-CM | POA: Diagnosis not present

## 2023-03-27 DIAGNOSIS — I10 Essential (primary) hypertension: Secondary | ICD-10-CM | POA: Diagnosis not present

## 2023-03-27 DIAGNOSIS — F0153 Vascular dementia, unspecified severity, with mood disturbance: Secondary | ICD-10-CM | POA: Diagnosis not present

## 2023-03-27 DIAGNOSIS — E1140 Type 2 diabetes mellitus with diabetic neuropathy, unspecified: Secondary | ICD-10-CM | POA: Diagnosis not present

## 2023-03-27 DIAGNOSIS — R634 Abnormal weight loss: Secondary | ICD-10-CM | POA: Diagnosis not present

## 2023-03-27 DIAGNOSIS — I672 Cerebral atherosclerosis: Secondary | ICD-10-CM | POA: Diagnosis not present

## 2023-03-28 DIAGNOSIS — K219 Gastro-esophageal reflux disease without esophagitis: Secondary | ICD-10-CM | POA: Diagnosis not present

## 2023-03-28 DIAGNOSIS — R634 Abnormal weight loss: Secondary | ICD-10-CM | POA: Diagnosis not present

## 2023-03-28 DIAGNOSIS — N390 Urinary tract infection, site not specified: Secondary | ICD-10-CM | POA: Diagnosis not present

## 2023-03-28 DIAGNOSIS — I441 Atrioventricular block, second degree: Secondary | ICD-10-CM | POA: Diagnosis not present

## 2023-03-28 DIAGNOSIS — F0153 Vascular dementia, unspecified severity, with mood disturbance: Secondary | ICD-10-CM | POA: Diagnosis not present

## 2023-03-28 DIAGNOSIS — I672 Cerebral atherosclerosis: Secondary | ICD-10-CM | POA: Diagnosis not present

## 2023-03-28 DIAGNOSIS — E1140 Type 2 diabetes mellitus with diabetic neuropathy, unspecified: Secondary | ICD-10-CM | POA: Diagnosis not present

## 2023-03-28 DIAGNOSIS — I10 Essential (primary) hypertension: Secondary | ICD-10-CM | POA: Diagnosis not present

## 2023-03-28 DIAGNOSIS — F32A Depression, unspecified: Secondary | ICD-10-CM | POA: Diagnosis not present

## 2023-03-28 DIAGNOSIS — G9340 Encephalopathy, unspecified: Secondary | ICD-10-CM | POA: Diagnosis not present

## 2023-03-28 DIAGNOSIS — R55 Syncope and collapse: Secondary | ICD-10-CM | POA: Diagnosis not present

## 2023-03-28 DIAGNOSIS — E785 Hyperlipidemia, unspecified: Secondary | ICD-10-CM | POA: Diagnosis not present

## 2023-03-29 DIAGNOSIS — F0153 Vascular dementia, unspecified severity, with mood disturbance: Secondary | ICD-10-CM | POA: Diagnosis not present

## 2023-03-29 DIAGNOSIS — E1140 Type 2 diabetes mellitus with diabetic neuropathy, unspecified: Secondary | ICD-10-CM | POA: Diagnosis not present

## 2023-03-29 DIAGNOSIS — R634 Abnormal weight loss: Secondary | ICD-10-CM | POA: Diagnosis not present

## 2023-03-29 DIAGNOSIS — I10 Essential (primary) hypertension: Secondary | ICD-10-CM | POA: Diagnosis not present

## 2023-03-29 DIAGNOSIS — G9340 Encephalopathy, unspecified: Secondary | ICD-10-CM | POA: Diagnosis not present

## 2023-03-29 DIAGNOSIS — I672 Cerebral atherosclerosis: Secondary | ICD-10-CM | POA: Diagnosis not present

## 2023-03-30 DIAGNOSIS — F0153 Vascular dementia, unspecified severity, with mood disturbance: Secondary | ICD-10-CM | POA: Diagnosis not present

## 2023-03-30 DIAGNOSIS — R634 Abnormal weight loss: Secondary | ICD-10-CM | POA: Diagnosis not present

## 2023-03-30 DIAGNOSIS — I672 Cerebral atherosclerosis: Secondary | ICD-10-CM | POA: Diagnosis not present

## 2023-03-30 DIAGNOSIS — E1140 Type 2 diabetes mellitus with diabetic neuropathy, unspecified: Secondary | ICD-10-CM | POA: Diagnosis not present

## 2023-03-30 DIAGNOSIS — G9340 Encephalopathy, unspecified: Secondary | ICD-10-CM | POA: Diagnosis not present

## 2023-03-30 DIAGNOSIS — I10 Essential (primary) hypertension: Secondary | ICD-10-CM | POA: Diagnosis not present

## 2023-03-31 DIAGNOSIS — E1140 Type 2 diabetes mellitus with diabetic neuropathy, unspecified: Secondary | ICD-10-CM | POA: Diagnosis not present

## 2023-03-31 DIAGNOSIS — I672 Cerebral atherosclerosis: Secondary | ICD-10-CM | POA: Diagnosis not present

## 2023-03-31 DIAGNOSIS — G9340 Encephalopathy, unspecified: Secondary | ICD-10-CM | POA: Diagnosis not present

## 2023-03-31 DIAGNOSIS — R634 Abnormal weight loss: Secondary | ICD-10-CM | POA: Diagnosis not present

## 2023-03-31 DIAGNOSIS — F0153 Vascular dementia, unspecified severity, with mood disturbance: Secondary | ICD-10-CM | POA: Diagnosis not present

## 2023-03-31 DIAGNOSIS — I10 Essential (primary) hypertension: Secondary | ICD-10-CM | POA: Diagnosis not present

## 2023-04-01 ENCOUNTER — Encounter (INDEPENDENT_AMBULATORY_CARE_PROVIDER_SITE_OTHER): Payer: Medicare Other | Admitting: Orthopedic Surgery

## 2023-04-01 DIAGNOSIS — I10 Essential (primary) hypertension: Secondary | ICD-10-CM | POA: Diagnosis not present

## 2023-04-01 DIAGNOSIS — G9340 Encephalopathy, unspecified: Secondary | ICD-10-CM | POA: Diagnosis not present

## 2023-04-01 DIAGNOSIS — I672 Cerebral atherosclerosis: Secondary | ICD-10-CM | POA: Diagnosis not present

## 2023-04-01 DIAGNOSIS — E1140 Type 2 diabetes mellitus with diabetic neuropathy, unspecified: Secondary | ICD-10-CM | POA: Diagnosis not present

## 2023-04-01 DIAGNOSIS — F0153 Vascular dementia, unspecified severity, with mood disturbance: Secondary | ICD-10-CM | POA: Diagnosis not present

## 2023-04-01 DIAGNOSIS — R634 Abnormal weight loss: Secondary | ICD-10-CM | POA: Diagnosis not present

## 2023-04-02 DIAGNOSIS — R634 Abnormal weight loss: Secondary | ICD-10-CM | POA: Diagnosis not present

## 2023-04-02 DIAGNOSIS — G9340 Encephalopathy, unspecified: Secondary | ICD-10-CM | POA: Diagnosis not present

## 2023-04-02 DIAGNOSIS — E1140 Type 2 diabetes mellitus with diabetic neuropathy, unspecified: Secondary | ICD-10-CM | POA: Diagnosis not present

## 2023-04-02 DIAGNOSIS — I672 Cerebral atherosclerosis: Secondary | ICD-10-CM | POA: Diagnosis not present

## 2023-04-02 DIAGNOSIS — I10 Essential (primary) hypertension: Secondary | ICD-10-CM | POA: Diagnosis not present

## 2023-04-02 DIAGNOSIS — F0153 Vascular dementia, unspecified severity, with mood disturbance: Secondary | ICD-10-CM | POA: Diagnosis not present

## 2023-04-02 NOTE — Progress Notes (Signed)
This encounter was created in error - please disregard.

## 2023-04-03 DIAGNOSIS — I10 Essential (primary) hypertension: Secondary | ICD-10-CM | POA: Diagnosis not present

## 2023-04-03 DIAGNOSIS — E1140 Type 2 diabetes mellitus with diabetic neuropathy, unspecified: Secondary | ICD-10-CM | POA: Diagnosis not present

## 2023-04-03 DIAGNOSIS — G9340 Encephalopathy, unspecified: Secondary | ICD-10-CM | POA: Diagnosis not present

## 2023-04-03 DIAGNOSIS — I672 Cerebral atherosclerosis: Secondary | ICD-10-CM | POA: Diagnosis not present

## 2023-04-03 DIAGNOSIS — F0153 Vascular dementia, unspecified severity, with mood disturbance: Secondary | ICD-10-CM | POA: Diagnosis not present

## 2023-04-03 DIAGNOSIS — R634 Abnormal weight loss: Secondary | ICD-10-CM | POA: Diagnosis not present

## 2023-04-05 DIAGNOSIS — G9340 Encephalopathy, unspecified: Secondary | ICD-10-CM | POA: Diagnosis not present

## 2023-04-05 DIAGNOSIS — F0153 Vascular dementia, unspecified severity, with mood disturbance: Secondary | ICD-10-CM | POA: Diagnosis not present

## 2023-04-05 DIAGNOSIS — E1140 Type 2 diabetes mellitus with diabetic neuropathy, unspecified: Secondary | ICD-10-CM | POA: Diagnosis not present

## 2023-04-05 DIAGNOSIS — R634 Abnormal weight loss: Secondary | ICD-10-CM | POA: Diagnosis not present

## 2023-04-05 DIAGNOSIS — I672 Cerebral atherosclerosis: Secondary | ICD-10-CM | POA: Diagnosis not present

## 2023-04-05 DIAGNOSIS — I10 Essential (primary) hypertension: Secondary | ICD-10-CM | POA: Diagnosis not present

## 2023-04-06 DIAGNOSIS — G9340 Encephalopathy, unspecified: Secondary | ICD-10-CM | POA: Diagnosis not present

## 2023-04-06 DIAGNOSIS — I672 Cerebral atherosclerosis: Secondary | ICD-10-CM | POA: Diagnosis not present

## 2023-04-06 DIAGNOSIS — F0153 Vascular dementia, unspecified severity, with mood disturbance: Secondary | ICD-10-CM | POA: Diagnosis not present

## 2023-04-06 DIAGNOSIS — R634 Abnormal weight loss: Secondary | ICD-10-CM | POA: Diagnosis not present

## 2023-04-06 DIAGNOSIS — I10 Essential (primary) hypertension: Secondary | ICD-10-CM | POA: Diagnosis not present

## 2023-04-06 DIAGNOSIS — E1140 Type 2 diabetes mellitus with diabetic neuropathy, unspecified: Secondary | ICD-10-CM | POA: Diagnosis not present

## 2023-04-07 DIAGNOSIS — F0153 Vascular dementia, unspecified severity, with mood disturbance: Secondary | ICD-10-CM | POA: Diagnosis not present

## 2023-04-07 DIAGNOSIS — R634 Abnormal weight loss: Secondary | ICD-10-CM | POA: Diagnosis not present

## 2023-04-07 DIAGNOSIS — I10 Essential (primary) hypertension: Secondary | ICD-10-CM | POA: Diagnosis not present

## 2023-04-07 DIAGNOSIS — I672 Cerebral atherosclerosis: Secondary | ICD-10-CM | POA: Diagnosis not present

## 2023-04-07 DIAGNOSIS — E1140 Type 2 diabetes mellitus with diabetic neuropathy, unspecified: Secondary | ICD-10-CM | POA: Diagnosis not present

## 2023-04-07 DIAGNOSIS — G9340 Encephalopathy, unspecified: Secondary | ICD-10-CM | POA: Diagnosis not present

## 2023-04-08 DIAGNOSIS — R634 Abnormal weight loss: Secondary | ICD-10-CM | POA: Diagnosis not present

## 2023-04-08 DIAGNOSIS — E1140 Type 2 diabetes mellitus with diabetic neuropathy, unspecified: Secondary | ICD-10-CM | POA: Diagnosis not present

## 2023-04-08 DIAGNOSIS — G9340 Encephalopathy, unspecified: Secondary | ICD-10-CM | POA: Diagnosis not present

## 2023-04-08 DIAGNOSIS — I10 Essential (primary) hypertension: Secondary | ICD-10-CM | POA: Diagnosis not present

## 2023-04-08 DIAGNOSIS — I672 Cerebral atherosclerosis: Secondary | ICD-10-CM | POA: Diagnosis not present

## 2023-04-08 DIAGNOSIS — F0153 Vascular dementia, unspecified severity, with mood disturbance: Secondary | ICD-10-CM | POA: Diagnosis not present

## 2023-04-09 DIAGNOSIS — F0153 Vascular dementia, unspecified severity, with mood disturbance: Secondary | ICD-10-CM | POA: Diagnosis not present

## 2023-04-09 DIAGNOSIS — R634 Abnormal weight loss: Secondary | ICD-10-CM | POA: Diagnosis not present

## 2023-04-09 DIAGNOSIS — G9340 Encephalopathy, unspecified: Secondary | ICD-10-CM | POA: Diagnosis not present

## 2023-04-09 DIAGNOSIS — E1140 Type 2 diabetes mellitus with diabetic neuropathy, unspecified: Secondary | ICD-10-CM | POA: Diagnosis not present

## 2023-04-09 DIAGNOSIS — I10 Essential (primary) hypertension: Secondary | ICD-10-CM | POA: Diagnosis not present

## 2023-04-09 DIAGNOSIS — I672 Cerebral atherosclerosis: Secondary | ICD-10-CM | POA: Diagnosis not present

## 2023-04-10 DIAGNOSIS — F0153 Vascular dementia, unspecified severity, with mood disturbance: Secondary | ICD-10-CM | POA: Diagnosis not present

## 2023-04-10 DIAGNOSIS — I10 Essential (primary) hypertension: Secondary | ICD-10-CM | POA: Diagnosis not present

## 2023-04-10 DIAGNOSIS — R634 Abnormal weight loss: Secondary | ICD-10-CM | POA: Diagnosis not present

## 2023-04-10 DIAGNOSIS — E1140 Type 2 diabetes mellitus with diabetic neuropathy, unspecified: Secondary | ICD-10-CM | POA: Diagnosis not present

## 2023-04-10 DIAGNOSIS — G9340 Encephalopathy, unspecified: Secondary | ICD-10-CM | POA: Diagnosis not present

## 2023-04-10 DIAGNOSIS — I672 Cerebral atherosclerosis: Secondary | ICD-10-CM | POA: Diagnosis not present

## 2023-04-11 DIAGNOSIS — F0153 Vascular dementia, unspecified severity, with mood disturbance: Secondary | ICD-10-CM | POA: Diagnosis not present

## 2023-04-11 DIAGNOSIS — G9340 Encephalopathy, unspecified: Secondary | ICD-10-CM | POA: Diagnosis not present

## 2023-04-11 DIAGNOSIS — R634 Abnormal weight loss: Secondary | ICD-10-CM | POA: Diagnosis not present

## 2023-04-11 DIAGNOSIS — I10 Essential (primary) hypertension: Secondary | ICD-10-CM | POA: Diagnosis not present

## 2023-04-11 DIAGNOSIS — I672 Cerebral atherosclerosis: Secondary | ICD-10-CM | POA: Diagnosis not present

## 2023-04-11 DIAGNOSIS — E1140 Type 2 diabetes mellitus with diabetic neuropathy, unspecified: Secondary | ICD-10-CM | POA: Diagnosis not present

## 2023-04-12 DIAGNOSIS — I10 Essential (primary) hypertension: Secondary | ICD-10-CM | POA: Diagnosis not present

## 2023-04-12 DIAGNOSIS — E1140 Type 2 diabetes mellitus with diabetic neuropathy, unspecified: Secondary | ICD-10-CM | POA: Diagnosis not present

## 2023-04-12 DIAGNOSIS — I672 Cerebral atherosclerosis: Secondary | ICD-10-CM | POA: Diagnosis not present

## 2023-04-12 DIAGNOSIS — G9340 Encephalopathy, unspecified: Secondary | ICD-10-CM | POA: Diagnosis not present

## 2023-04-12 DIAGNOSIS — R634 Abnormal weight loss: Secondary | ICD-10-CM | POA: Diagnosis not present

## 2023-04-12 DIAGNOSIS — F0153 Vascular dementia, unspecified severity, with mood disturbance: Secondary | ICD-10-CM | POA: Diagnosis not present

## 2023-04-13 DIAGNOSIS — E1140 Type 2 diabetes mellitus with diabetic neuropathy, unspecified: Secondary | ICD-10-CM | POA: Diagnosis not present

## 2023-04-13 DIAGNOSIS — R634 Abnormal weight loss: Secondary | ICD-10-CM | POA: Diagnosis not present

## 2023-04-13 DIAGNOSIS — I10 Essential (primary) hypertension: Secondary | ICD-10-CM | POA: Diagnosis not present

## 2023-04-13 DIAGNOSIS — F0153 Vascular dementia, unspecified severity, with mood disturbance: Secondary | ICD-10-CM | POA: Diagnosis not present

## 2023-04-13 DIAGNOSIS — I672 Cerebral atherosclerosis: Secondary | ICD-10-CM | POA: Diagnosis not present

## 2023-04-13 DIAGNOSIS — G9340 Encephalopathy, unspecified: Secondary | ICD-10-CM | POA: Diagnosis not present

## 2023-04-14 DIAGNOSIS — G9340 Encephalopathy, unspecified: Secondary | ICD-10-CM | POA: Diagnosis not present

## 2023-04-14 DIAGNOSIS — I10 Essential (primary) hypertension: Secondary | ICD-10-CM | POA: Diagnosis not present

## 2023-04-14 DIAGNOSIS — E1140 Type 2 diabetes mellitus with diabetic neuropathy, unspecified: Secondary | ICD-10-CM | POA: Diagnosis not present

## 2023-04-14 DIAGNOSIS — F0153 Vascular dementia, unspecified severity, with mood disturbance: Secondary | ICD-10-CM | POA: Diagnosis not present

## 2023-04-14 DIAGNOSIS — R634 Abnormal weight loss: Secondary | ICD-10-CM | POA: Diagnosis not present

## 2023-04-14 DIAGNOSIS — I672 Cerebral atherosclerosis: Secondary | ICD-10-CM | POA: Diagnosis not present

## 2023-04-15 DIAGNOSIS — I672 Cerebral atherosclerosis: Secondary | ICD-10-CM | POA: Diagnosis not present

## 2023-04-15 DIAGNOSIS — I10 Essential (primary) hypertension: Secondary | ICD-10-CM | POA: Diagnosis not present

## 2023-04-15 DIAGNOSIS — F0153 Vascular dementia, unspecified severity, with mood disturbance: Secondary | ICD-10-CM | POA: Diagnosis not present

## 2023-04-15 DIAGNOSIS — G9340 Encephalopathy, unspecified: Secondary | ICD-10-CM | POA: Diagnosis not present

## 2023-04-15 DIAGNOSIS — E1140 Type 2 diabetes mellitus with diabetic neuropathy, unspecified: Secondary | ICD-10-CM | POA: Diagnosis not present

## 2023-04-15 DIAGNOSIS — R634 Abnormal weight loss: Secondary | ICD-10-CM | POA: Diagnosis not present

## 2023-04-16 DIAGNOSIS — I10 Essential (primary) hypertension: Secondary | ICD-10-CM | POA: Diagnosis not present

## 2023-04-16 DIAGNOSIS — I672 Cerebral atherosclerosis: Secondary | ICD-10-CM | POA: Diagnosis not present

## 2023-04-16 DIAGNOSIS — F0153 Vascular dementia, unspecified severity, with mood disturbance: Secondary | ICD-10-CM | POA: Diagnosis not present

## 2023-04-16 DIAGNOSIS — E1140 Type 2 diabetes mellitus with diabetic neuropathy, unspecified: Secondary | ICD-10-CM | POA: Diagnosis not present

## 2023-04-16 DIAGNOSIS — R634 Abnormal weight loss: Secondary | ICD-10-CM | POA: Diagnosis not present

## 2023-04-16 DIAGNOSIS — G9340 Encephalopathy, unspecified: Secondary | ICD-10-CM | POA: Diagnosis not present

## 2023-04-17 DIAGNOSIS — G9340 Encephalopathy, unspecified: Secondary | ICD-10-CM | POA: Diagnosis not present

## 2023-04-17 DIAGNOSIS — F0153 Vascular dementia, unspecified severity, with mood disturbance: Secondary | ICD-10-CM | POA: Diagnosis not present

## 2023-04-17 DIAGNOSIS — R634 Abnormal weight loss: Secondary | ICD-10-CM | POA: Diagnosis not present

## 2023-04-17 DIAGNOSIS — I10 Essential (primary) hypertension: Secondary | ICD-10-CM | POA: Diagnosis not present

## 2023-04-17 DIAGNOSIS — E1140 Type 2 diabetes mellitus with diabetic neuropathy, unspecified: Secondary | ICD-10-CM | POA: Diagnosis not present

## 2023-04-17 DIAGNOSIS — I672 Cerebral atherosclerosis: Secondary | ICD-10-CM | POA: Diagnosis not present

## 2023-04-18 DIAGNOSIS — F0153 Vascular dementia, unspecified severity, with mood disturbance: Secondary | ICD-10-CM | POA: Diagnosis not present

## 2023-04-18 DIAGNOSIS — I672 Cerebral atherosclerosis: Secondary | ICD-10-CM | POA: Diagnosis not present

## 2023-04-18 DIAGNOSIS — R634 Abnormal weight loss: Secondary | ICD-10-CM | POA: Diagnosis not present

## 2023-04-18 DIAGNOSIS — I10 Essential (primary) hypertension: Secondary | ICD-10-CM | POA: Diagnosis not present

## 2023-04-18 DIAGNOSIS — E1140 Type 2 diabetes mellitus with diabetic neuropathy, unspecified: Secondary | ICD-10-CM | POA: Diagnosis not present

## 2023-04-18 DIAGNOSIS — G9340 Encephalopathy, unspecified: Secondary | ICD-10-CM | POA: Diagnosis not present

## 2023-04-20 DIAGNOSIS — I10 Essential (primary) hypertension: Secondary | ICD-10-CM | POA: Diagnosis not present

## 2023-04-20 DIAGNOSIS — I672 Cerebral atherosclerosis: Secondary | ICD-10-CM | POA: Diagnosis not present

## 2023-04-20 DIAGNOSIS — G9340 Encephalopathy, unspecified: Secondary | ICD-10-CM | POA: Diagnosis not present

## 2023-04-20 DIAGNOSIS — E1140 Type 2 diabetes mellitus with diabetic neuropathy, unspecified: Secondary | ICD-10-CM | POA: Diagnosis not present

## 2023-04-20 DIAGNOSIS — F0153 Vascular dementia, unspecified severity, with mood disturbance: Secondary | ICD-10-CM | POA: Diagnosis not present

## 2023-04-20 DIAGNOSIS — R634 Abnormal weight loss: Secondary | ICD-10-CM | POA: Diagnosis not present

## 2023-04-21 DIAGNOSIS — F0153 Vascular dementia, unspecified severity, with mood disturbance: Secondary | ICD-10-CM | POA: Diagnosis not present

## 2023-04-21 DIAGNOSIS — I10 Essential (primary) hypertension: Secondary | ICD-10-CM | POA: Diagnosis not present

## 2023-04-21 DIAGNOSIS — R634 Abnormal weight loss: Secondary | ICD-10-CM | POA: Diagnosis not present

## 2023-04-21 DIAGNOSIS — E1140 Type 2 diabetes mellitus with diabetic neuropathy, unspecified: Secondary | ICD-10-CM | POA: Diagnosis not present

## 2023-04-21 DIAGNOSIS — I672 Cerebral atherosclerosis: Secondary | ICD-10-CM | POA: Diagnosis not present

## 2023-04-21 DIAGNOSIS — G9340 Encephalopathy, unspecified: Secondary | ICD-10-CM | POA: Diagnosis not present

## 2023-04-28 DEATH — deceased
